# Patient Record
Sex: Female | Born: 1950
Health system: Southern US, Community
[De-identification: ages and names within clinical notes are randomized; demographics above are authoritative.]

## PROBLEM LIST (undated history)

## (undated) ENCOUNTER — Ambulatory Visit: Admission: EM

## (undated) DIAGNOSIS — K2981 Duodenitis with bleeding: Secondary | ICD-10-CM

## (undated) DIAGNOSIS — J449 Chronic obstructive pulmonary disease, unspecified: Secondary | ICD-10-CM

## (undated) DIAGNOSIS — I4891 Unspecified atrial fibrillation: Secondary | ICD-10-CM

## (undated) DIAGNOSIS — K7460 Unspecified cirrhosis of liver: Secondary | ICD-10-CM

## (undated) DIAGNOSIS — K922 Gastrointestinal hemorrhage, unspecified: Secondary | ICD-10-CM

## (undated) DIAGNOSIS — R011 Cardiac murmur, unspecified: Secondary | ICD-10-CM

## (undated) DIAGNOSIS — I10 Essential (primary) hypertension: Secondary | ICD-10-CM

## (undated) DIAGNOSIS — M1990 Unspecified osteoarthritis, unspecified site: Secondary | ICD-10-CM

## (undated) DIAGNOSIS — E039 Hypothyroidism, unspecified: Secondary | ICD-10-CM

## (undated) DIAGNOSIS — N189 Chronic kidney disease, unspecified: Secondary | ICD-10-CM

## (undated) DIAGNOSIS — L919 Hypertrophic disorder of the skin, unspecified: Secondary | ICD-10-CM

## (undated) DIAGNOSIS — L909 Atrophic disorder of skin, unspecified: Secondary | ICD-10-CM

## (undated) DIAGNOSIS — E079 Disorder of thyroid, unspecified: Secondary | ICD-10-CM

## (undated) HISTORY — PX: BREAST EXCISIONAL BIOPSY: SUR124

## (undated) HISTORY — PX: TUBAL LIGATION: SHX77

## (undated) HISTORY — DX: Hypertrophic disorder of the skin, unspecified: L91.9

## (undated) HISTORY — DX: Chronic obstructive pulmonary disease, unspecified: J44.9

## (undated) HISTORY — DX: Chronic kidney disease, unspecified: N18.9

## (undated) HISTORY — DX: Atrophic disorder of skin, unspecified: L90.9

## (undated) HISTORY — PX: UPPER GASTROINTESTINAL ENDOSCOPY: SHX188

## (undated) HISTORY — PX: COLONOSCOPY: SHX174

## (undated) HISTORY — DX: Unspecified osteoarthritis, unspecified site: M19.90

---

## 1997-08-04 ENCOUNTER — Ambulatory Visit (HOSPITAL_COMMUNITY): Admission: RE | Admit: 1997-08-04 | Discharge: 1997-08-04 | Payer: Self-pay | Admitting: Family Medicine

## 1999-01-29 ENCOUNTER — Emergency Department (HOSPITAL_COMMUNITY): Admission: EM | Admit: 1999-01-29 | Discharge: 1999-01-29 | Payer: Self-pay | Admitting: Emergency Medicine

## 1999-02-03 ENCOUNTER — Other Ambulatory Visit: Admission: RE | Admit: 1999-02-03 | Discharge: 1999-02-03 | Payer: Self-pay | Admitting: *Deleted

## 1999-02-17 ENCOUNTER — Other Ambulatory Visit: Admission: RE | Admit: 1999-02-17 | Discharge: 1999-02-17 | Payer: Self-pay | Admitting: *Deleted

## 1999-02-17 ENCOUNTER — Encounter (INDEPENDENT_AMBULATORY_CARE_PROVIDER_SITE_OTHER): Payer: Self-pay | Admitting: Specialist

## 1999-03-19 ENCOUNTER — Encounter: Payer: Self-pay | Admitting: Emergency Medicine

## 1999-03-19 ENCOUNTER — Inpatient Hospital Stay (HOSPITAL_COMMUNITY): Admission: EM | Admit: 1999-03-19 | Discharge: 1999-03-28 | Payer: Self-pay | Admitting: Emergency Medicine

## 1999-03-20 ENCOUNTER — Encounter: Payer: Self-pay | Admitting: Internal Medicine

## 1999-03-21 ENCOUNTER — Encounter: Payer: Self-pay | Admitting: Critical Care Medicine

## 1999-03-25 ENCOUNTER — Encounter: Payer: Self-pay | Admitting: Internal Medicine

## 1999-03-27 ENCOUNTER — Encounter: Payer: Self-pay | Admitting: Gastroenterology

## 1999-04-03 ENCOUNTER — Other Ambulatory Visit (HOSPITAL_COMMUNITY): Admission: RE | Admit: 1999-04-03 | Discharge: 1999-04-13 | Payer: Self-pay | Admitting: Psychiatry

## 1999-11-03 ENCOUNTER — Emergency Department (HOSPITAL_COMMUNITY): Admission: EM | Admit: 1999-11-03 | Discharge: 1999-11-03 | Payer: Self-pay | Admitting: Emergency Medicine

## 1999-11-03 ENCOUNTER — Encounter: Payer: Self-pay | Admitting: Emergency Medicine

## 2000-01-08 ENCOUNTER — Inpatient Hospital Stay (HOSPITAL_COMMUNITY): Admission: EM | Admit: 2000-01-08 | Discharge: 2000-01-11 | Payer: Self-pay

## 2000-05-17 ENCOUNTER — Ambulatory Visit (HOSPITAL_COMMUNITY): Admission: RE | Admit: 2000-05-17 | Discharge: 2000-05-17 | Payer: Self-pay | Admitting: *Deleted

## 2002-07-12 ENCOUNTER — Inpatient Hospital Stay (HOSPITAL_COMMUNITY): Admission: EM | Admit: 2002-07-12 | Discharge: 2002-07-21 | Payer: Self-pay | Admitting: Emergency Medicine

## 2002-07-13 ENCOUNTER — Encounter: Payer: Self-pay | Admitting: Internal Medicine

## 2003-06-14 ENCOUNTER — Encounter: Admission: RE | Admit: 2003-06-14 | Discharge: 2003-06-14 | Payer: Self-pay | Admitting: Family Medicine

## 2003-09-28 ENCOUNTER — Encounter (HOSPITAL_COMMUNITY): Admission: RE | Admit: 2003-09-28 | Discharge: 2003-12-27 | Payer: Self-pay | Admitting: Nurse Practitioner

## 2003-11-25 ENCOUNTER — Encounter: Admission: RE | Admit: 2003-11-25 | Discharge: 2003-11-25 | Payer: Self-pay | Admitting: Internal Medicine

## 2004-03-03 ENCOUNTER — Ambulatory Visit: Payer: Self-pay | Admitting: *Deleted

## 2004-03-06 ENCOUNTER — Ambulatory Visit: Payer: Self-pay | Admitting: Nurse Practitioner

## 2004-03-13 ENCOUNTER — Ambulatory Visit: Payer: Self-pay | Admitting: Nurse Practitioner

## 2004-03-20 ENCOUNTER — Encounter (HOSPITAL_COMMUNITY): Admission: RE | Admit: 2004-03-20 | Discharge: 2004-06-18 | Payer: Self-pay | Admitting: Internal Medicine

## 2004-03-21 ENCOUNTER — Ambulatory Visit: Payer: Self-pay | Admitting: Nurse Practitioner

## 2004-03-28 ENCOUNTER — Ambulatory Visit: Payer: Self-pay | Admitting: *Deleted

## 2004-04-19 ENCOUNTER — Ambulatory Visit: Payer: Self-pay | Admitting: Nurse Practitioner

## 2004-05-02 ENCOUNTER — Ambulatory Visit: Payer: Self-pay | Admitting: Nurse Practitioner

## 2004-05-17 ENCOUNTER — Ambulatory Visit: Payer: Self-pay | Admitting: Nurse Practitioner

## 2004-05-22 ENCOUNTER — Ambulatory Visit: Payer: Self-pay | Admitting: Nurse Practitioner

## 2004-06-01 ENCOUNTER — Ambulatory Visit: Payer: Self-pay | Admitting: Nurse Practitioner

## 2004-06-08 ENCOUNTER — Ambulatory Visit: Payer: Self-pay | Admitting: Nurse Practitioner

## 2004-07-10 ENCOUNTER — Ambulatory Visit: Payer: Self-pay | Admitting: Nurse Practitioner

## 2004-07-20 ENCOUNTER — Ambulatory Visit: Payer: Self-pay | Admitting: Nurse Practitioner

## 2004-07-27 ENCOUNTER — Ambulatory Visit: Payer: Self-pay | Admitting: Nurse Practitioner

## 2004-08-24 ENCOUNTER — Ambulatory Visit: Payer: Self-pay | Admitting: Nurse Practitioner

## 2004-09-07 ENCOUNTER — Ambulatory Visit: Payer: Self-pay | Admitting: Nurse Practitioner

## 2004-10-09 ENCOUNTER — Ambulatory Visit: Payer: Self-pay | Admitting: Nurse Practitioner

## 2005-01-04 ENCOUNTER — Encounter: Admission: RE | Admit: 2005-01-04 | Discharge: 2005-01-04 | Payer: Self-pay | Admitting: Geriatric Medicine

## 2005-01-22 ENCOUNTER — Encounter (HOSPITAL_COMMUNITY): Admission: RE | Admit: 2005-01-22 | Discharge: 2005-04-22 | Payer: Self-pay | Admitting: Endocrinology

## 2005-02-02 IMAGING — NM NM RAI THERAPY FOR HYPERTHYROIDISM
1 series · 1 of 1 positions shown · non-contrast
Comparison: [DATE].
 Radiopharmaceutical:  26 millicuries of [5B] per orally.

CLINICAL DATA: Graves? disease
 RADIOACTIVE IODINE THERAPY FOR TREATMENT OF GRAVES? DISEASE ? [DATE]:

[Series 1: st static image · 1 of 1 slices shown]
[im 1/1]
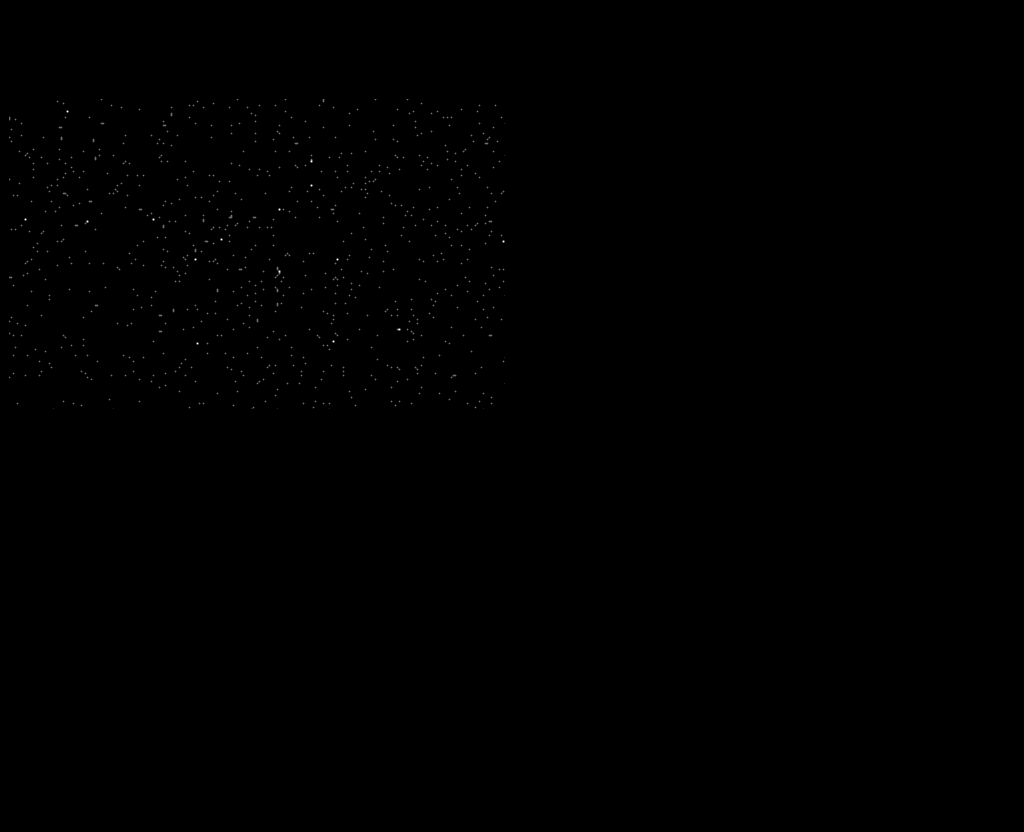

[1 of 1 positions shown; findings below may reference images not displayed]

FINDINGS: Risks and benefits of the treatment were discussed with the patient in detail.  As prescribed by Dr. LIBBIE, the patient received 26 millicuries of [5B] per orally.  No immediate complications were identified.
IMPRESSION: Successful per oral administration of 26 millicuries of [5B] sodium iodide for the treatment of Graves? disease.

## 2005-03-25 IMAGING — CR DG CHEST 1V PORT
1 series · 1 of 1 positions shown · non-contrast
Comparison: none

CLINICAL DATA: Upper GI bleeding.  Hypotension.
 PORTABLE CHEST - 1 VIEW:

[view not recorded]
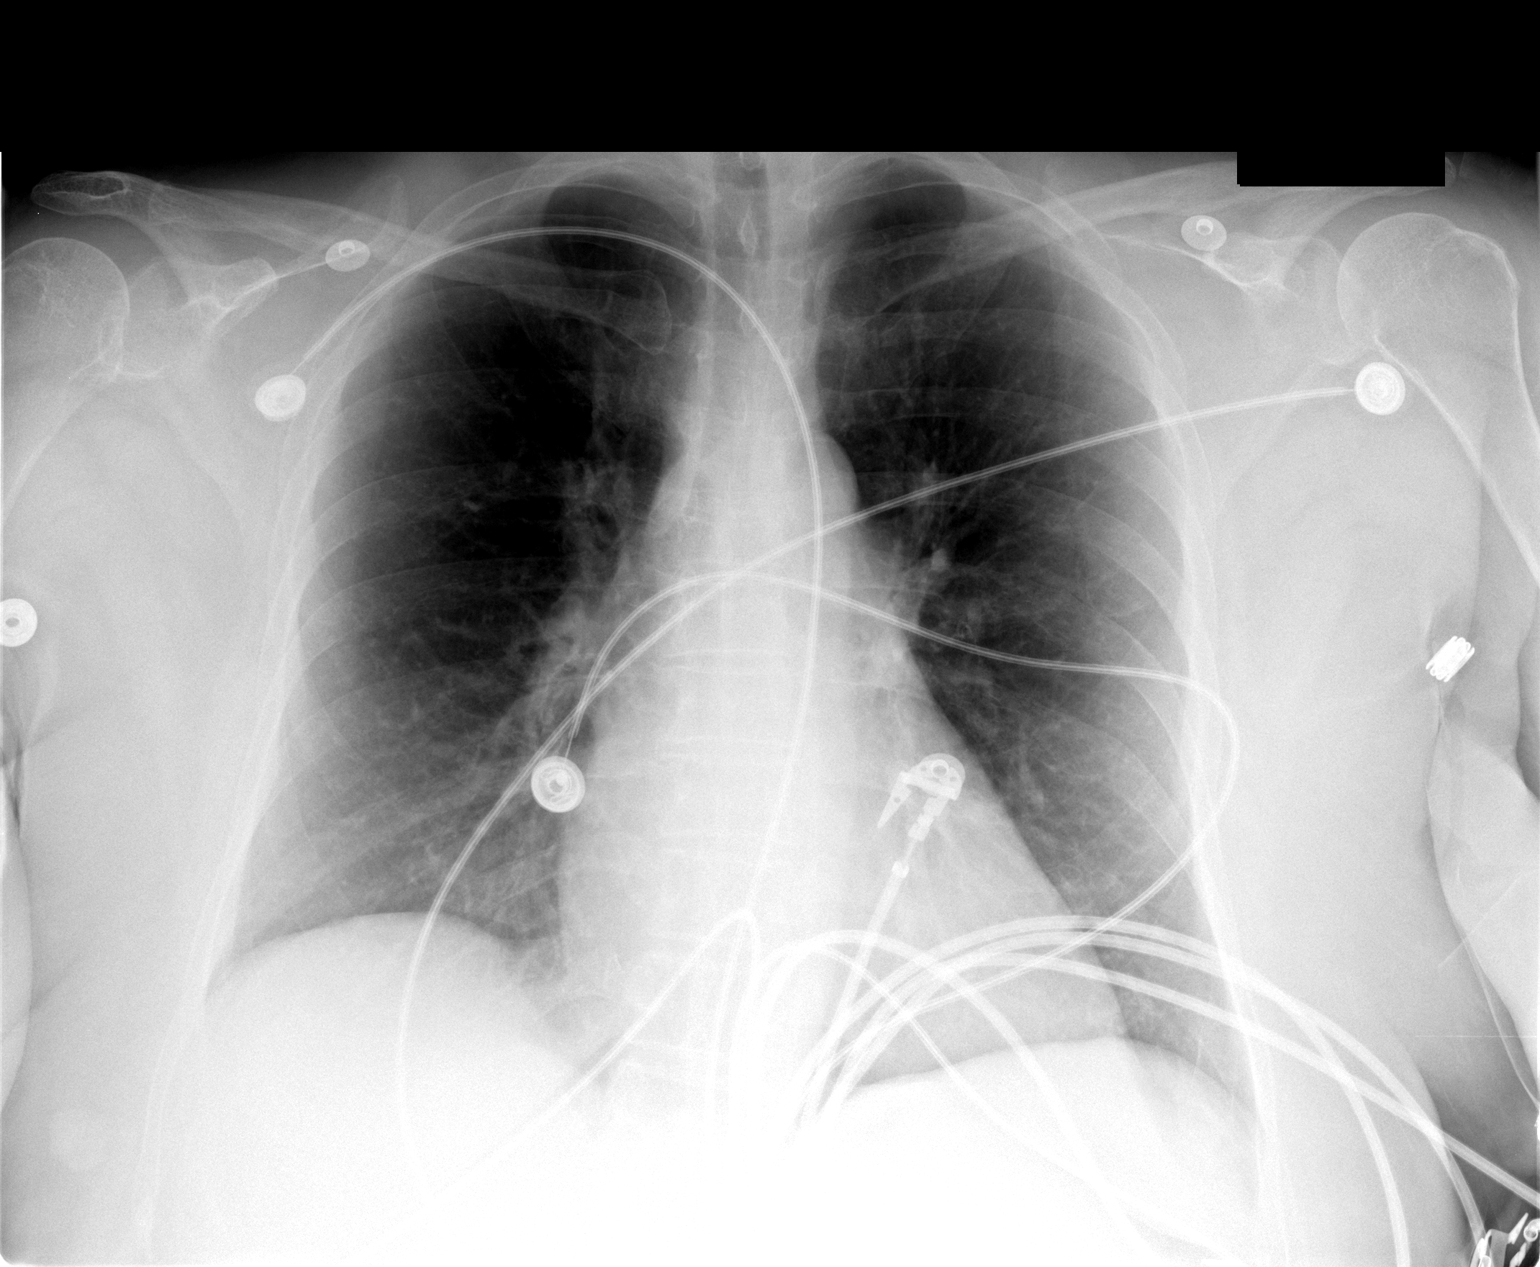

[1 of 1 positions shown; findings below may reference images not displayed]

FINDINGS: The heart size and mediastinal contours are within normal limits.  Both lungs are clear.
IMPRESSION: No acute disease.

## 2005-09-10 ENCOUNTER — Encounter: Admission: RE | Admit: 2005-09-10 | Discharge: 2005-09-10 | Payer: Self-pay | Admitting: Geriatric Medicine

## 2005-09-10 IMAGING — US UNKNOWN US STUDY
1 series · 12 of 12 positions shown · non-contrast
Comparison: none

BILATERAL BREAST ULTRASOUND:
CLINICAL DATA: The patient returns for short interval follow-up of a suspected benign fibroadenoma
in each breast noted on [DATE].

[Series 1: unknown us study · 12 of 12 slices shown]
[im 1/12]
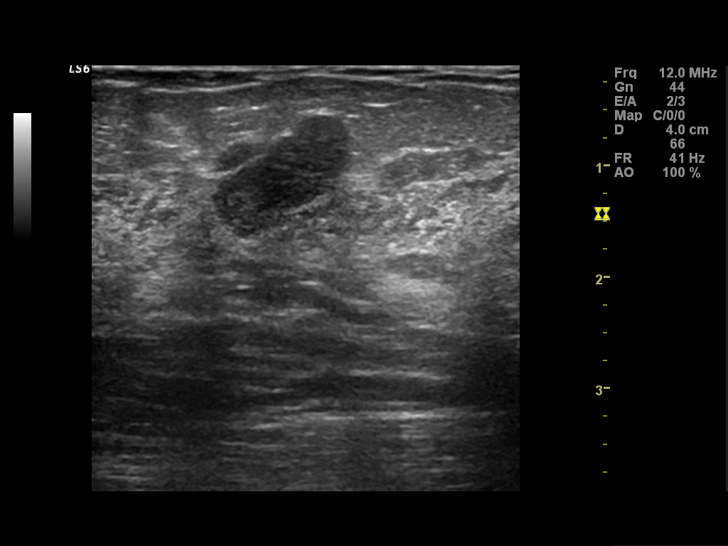
[im 2/12]
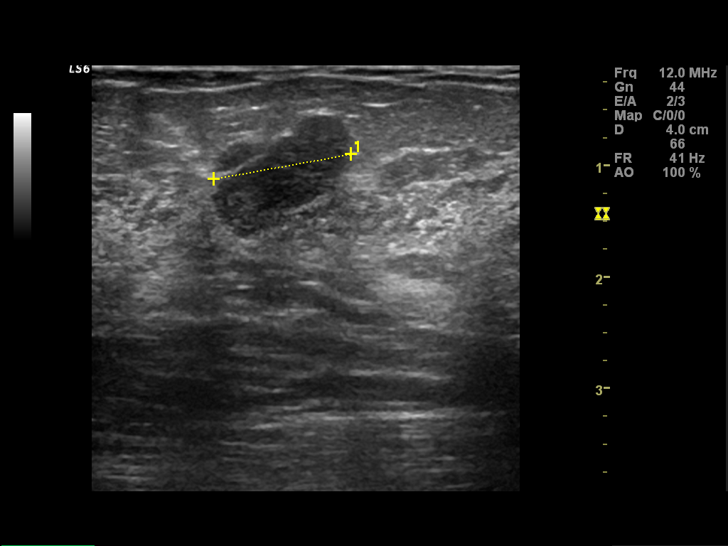
[im 3/12]
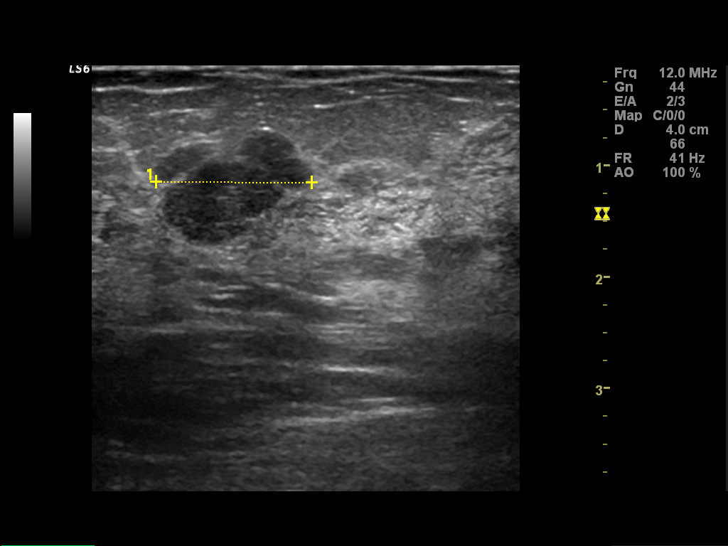
[im 4/12]
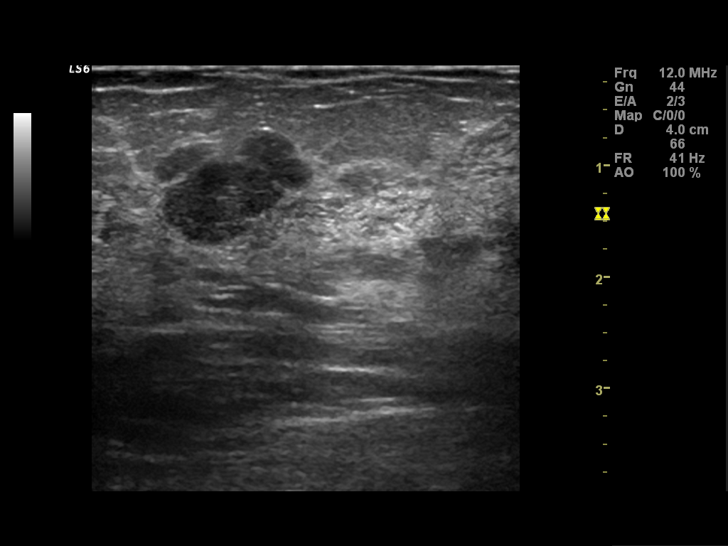
[im 5/12]
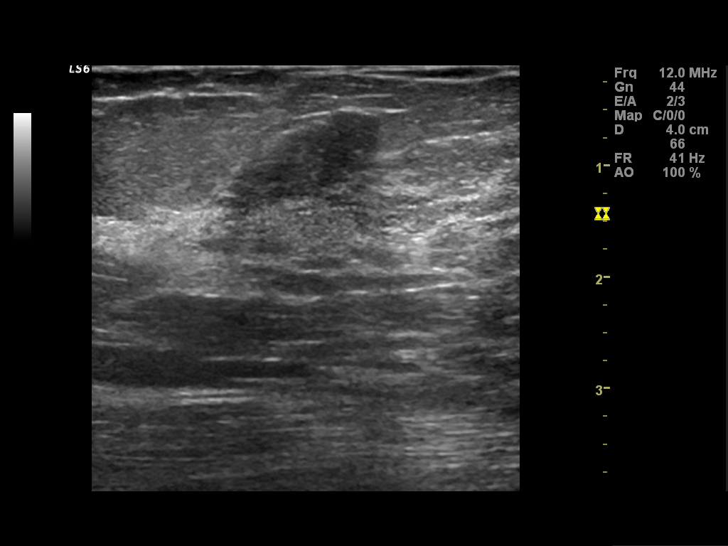
[im 6/12]
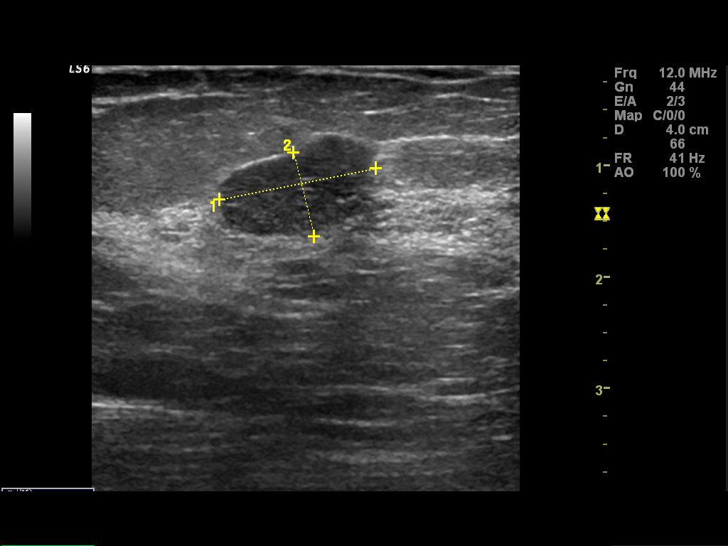
[im 7/12]
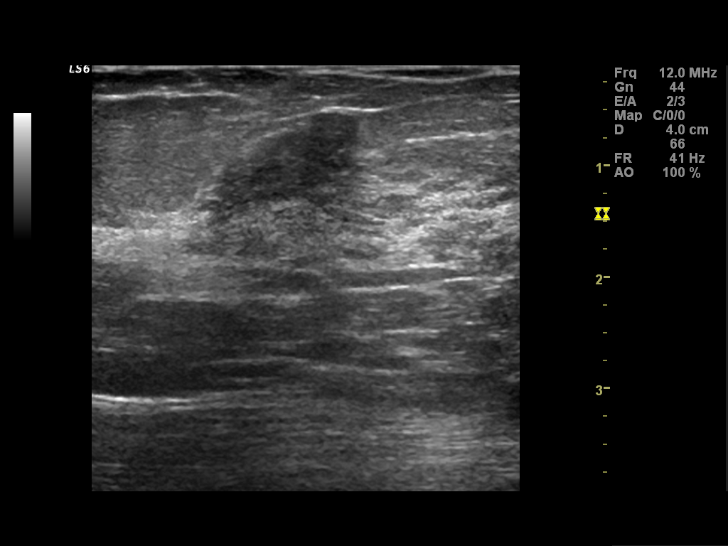
[im 8/12]
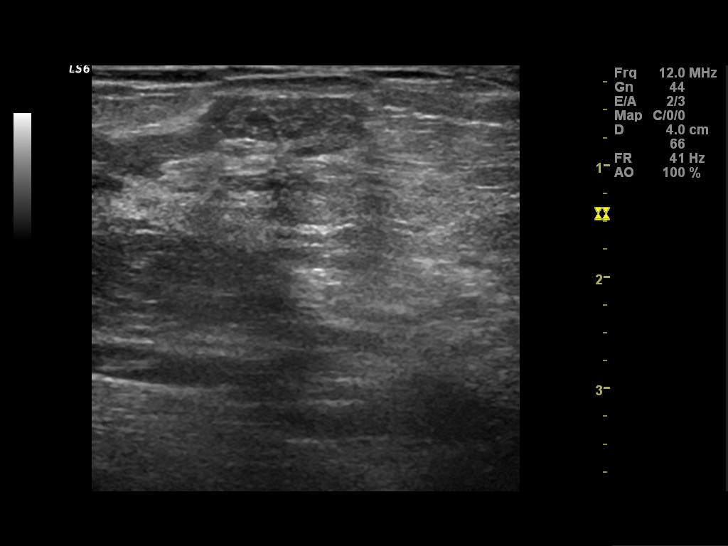
[im 9/12]
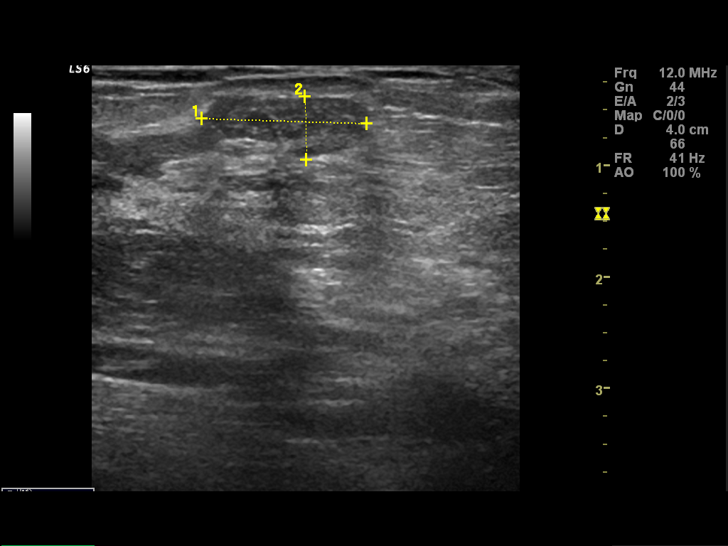
[im 10/12]
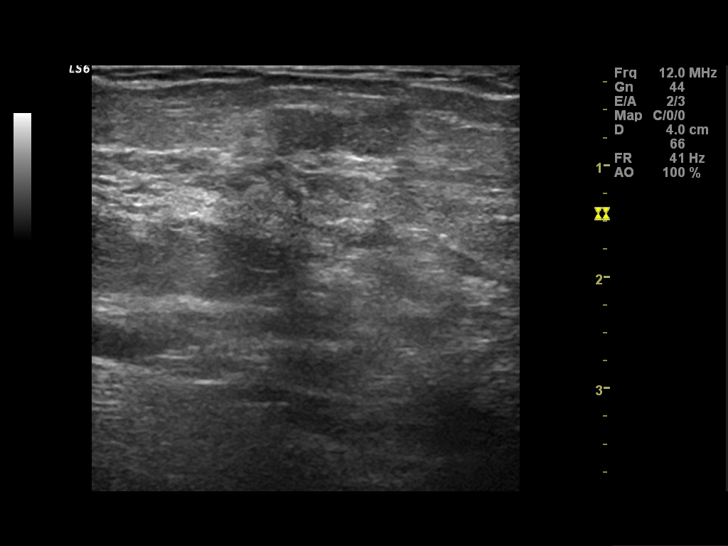
[im 11/12]
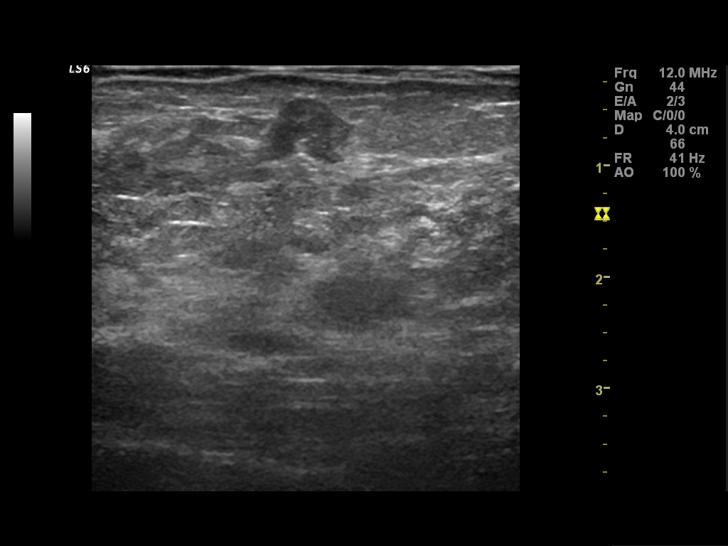
[im 12/12]
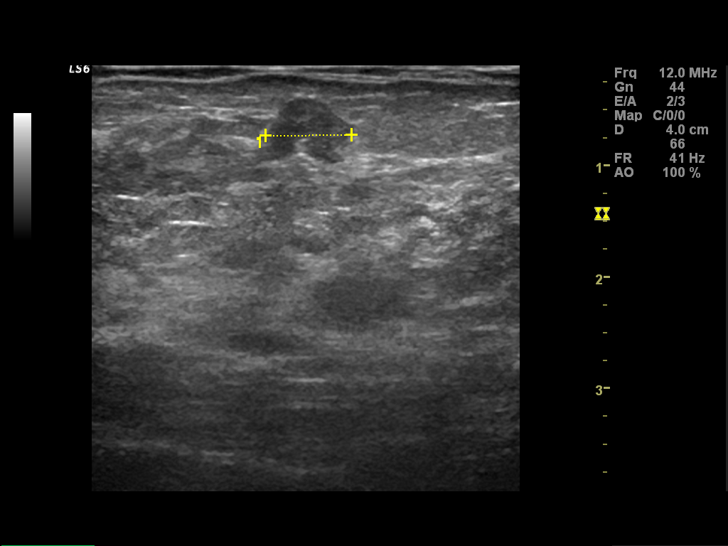

[12 of 12 positions shown; findings below may reference images not displayed]

On physical examination, the breast tissue is quite firm superiorly and bilaterally.  Sonography of
the right breast demonstrates a lobulated solid mass with a well-defined border measuring 1.4 x 
1.4 x 0.8 cm. This is unchanged in configuration as compared to the prior exam.

Sonography of the left breast demonstrates an oval hypoechoic nodule at 9 o'clock in zone 2 
measuring 1.5 x 0.6 x 0.8 cm.  This too is stable.
IMPRESSION: Stable probably benign fibroadenoma in each breast as described above.  Suggest bilateral 
diagnostic mammography and bilateral ultrasound in [DATE] which would be a one-year follow-up.

ASSESSMENT: Probably benign - BI-RADS 3

Diagnostic mammogram and ultrasound of both breasts in 4 months.

## 2006-06-18 ENCOUNTER — Inpatient Hospital Stay (HOSPITAL_COMMUNITY): Admission: EM | Admit: 2006-06-18 | Discharge: 2006-06-22 | Payer: Self-pay | Admitting: Emergency Medicine

## 2006-06-18 ENCOUNTER — Ambulatory Visit: Payer: Self-pay | Admitting: Family Medicine

## 2006-06-19 ENCOUNTER — Encounter: Payer: Self-pay | Admitting: Gastroenterology

## 2006-06-19 ENCOUNTER — Encounter (INDEPENDENT_AMBULATORY_CARE_PROVIDER_SITE_OTHER): Payer: Self-pay | Admitting: Specialist

## 2006-06-22 ENCOUNTER — Encounter: Payer: Self-pay | Admitting: Gastroenterology

## 2006-06-24 ENCOUNTER — Ambulatory Visit: Payer: Self-pay | Admitting: Gastroenterology

## 2006-08-05 ENCOUNTER — Ambulatory Visit: Payer: Self-pay | Admitting: Gastroenterology

## 2006-08-15 ENCOUNTER — Ambulatory Visit: Payer: Self-pay | Admitting: Gastroenterology

## 2006-08-15 ENCOUNTER — Encounter: Payer: Self-pay | Admitting: Internal Medicine

## 2006-11-19 ENCOUNTER — Encounter (INDEPENDENT_AMBULATORY_CARE_PROVIDER_SITE_OTHER): Payer: Self-pay | Admitting: Family Medicine

## 2006-11-19 ENCOUNTER — Ambulatory Visit: Payer: Self-pay | Admitting: Sports Medicine

## 2006-11-19 DIAGNOSIS — K7030 Alcoholic cirrhosis of liver without ascites: Secondary | ICD-10-CM | POA: Insufficient documentation

## 2006-11-19 LAB — CONVERTED CEMR LAB
HCT: 35.6 %
Hemoglobin: 12.1 g/dL
MCV: 90.1 fL
Platelets: 151 10*3/uL
RBC: 3.95 M/uL

## 2006-11-26 LAB — CONVERTED CEMR LAB
ALT: 13 units/L (ref 0–35)
Alkaline Phosphatase: 123 units/L — ABNORMAL HIGH (ref 39–117)
CO2: 28 meq/L (ref 19–32)
Creatinine, Ser: 0.8 mg/dL (ref 0.40–1.20)
TSH: 0.059 microintl units/mL — ABNORMAL LOW (ref 0.350–5.50)
Total Bilirubin: 1 mg/dL (ref 0.3–1.2)

## 2006-12-26 ENCOUNTER — Telehealth (INDEPENDENT_AMBULATORY_CARE_PROVIDER_SITE_OTHER): Payer: Self-pay | Admitting: Family Medicine

## 2006-12-31 ENCOUNTER — Encounter: Admission: RE | Admit: 2006-12-31 | Discharge: 2006-12-31 | Payer: Self-pay | Admitting: Family Medicine

## 2006-12-31 IMAGING — MG MM DIAGNOSTIC BILATERAL
4 series · 4 of 4 positions shown · non-contrast
Comparison: [DATE] and [DATE] ultrasound, [DATE] mammogram.

DG DIAGNOSTIC BILATERAL
Bilateral CC and MLO view(s) were taken.

BILATERAL BREAST ULTRASOUND
Technologist: [place], Medical
DIGITAL BILATERAL  DIAGNOSTIC MAMMOGRAM WITH CAD AND BILATERAL BREAST ULTRASOUND:
CLINICAL DATA: Short term follow-up bilateral breast masses.  The patient was due to return in 
[DATE] but did not return and presents for her annual exam today.

[R CC]
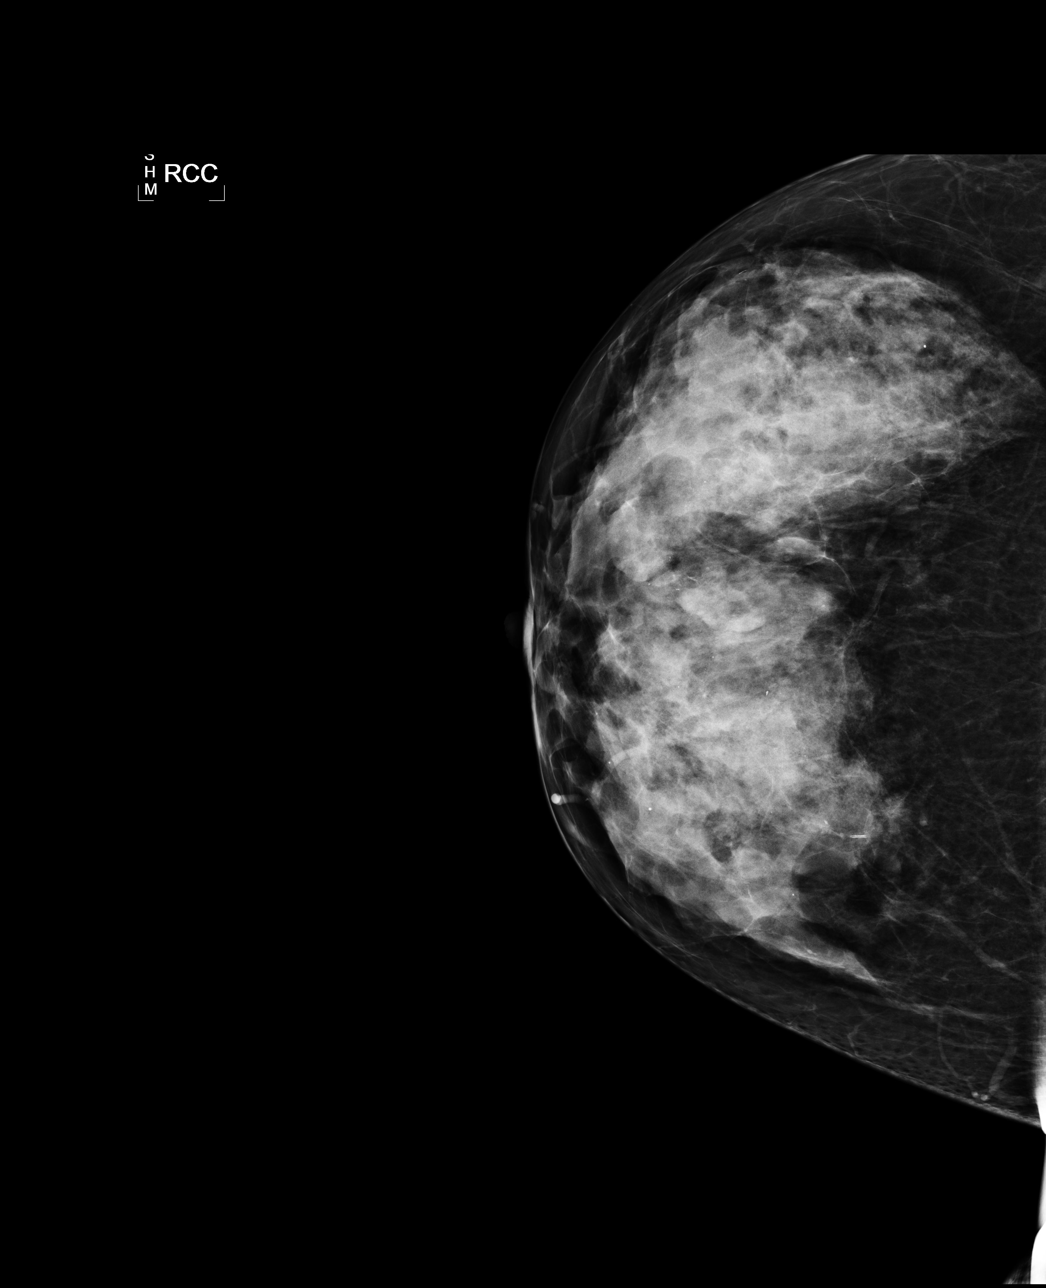

[L CC]
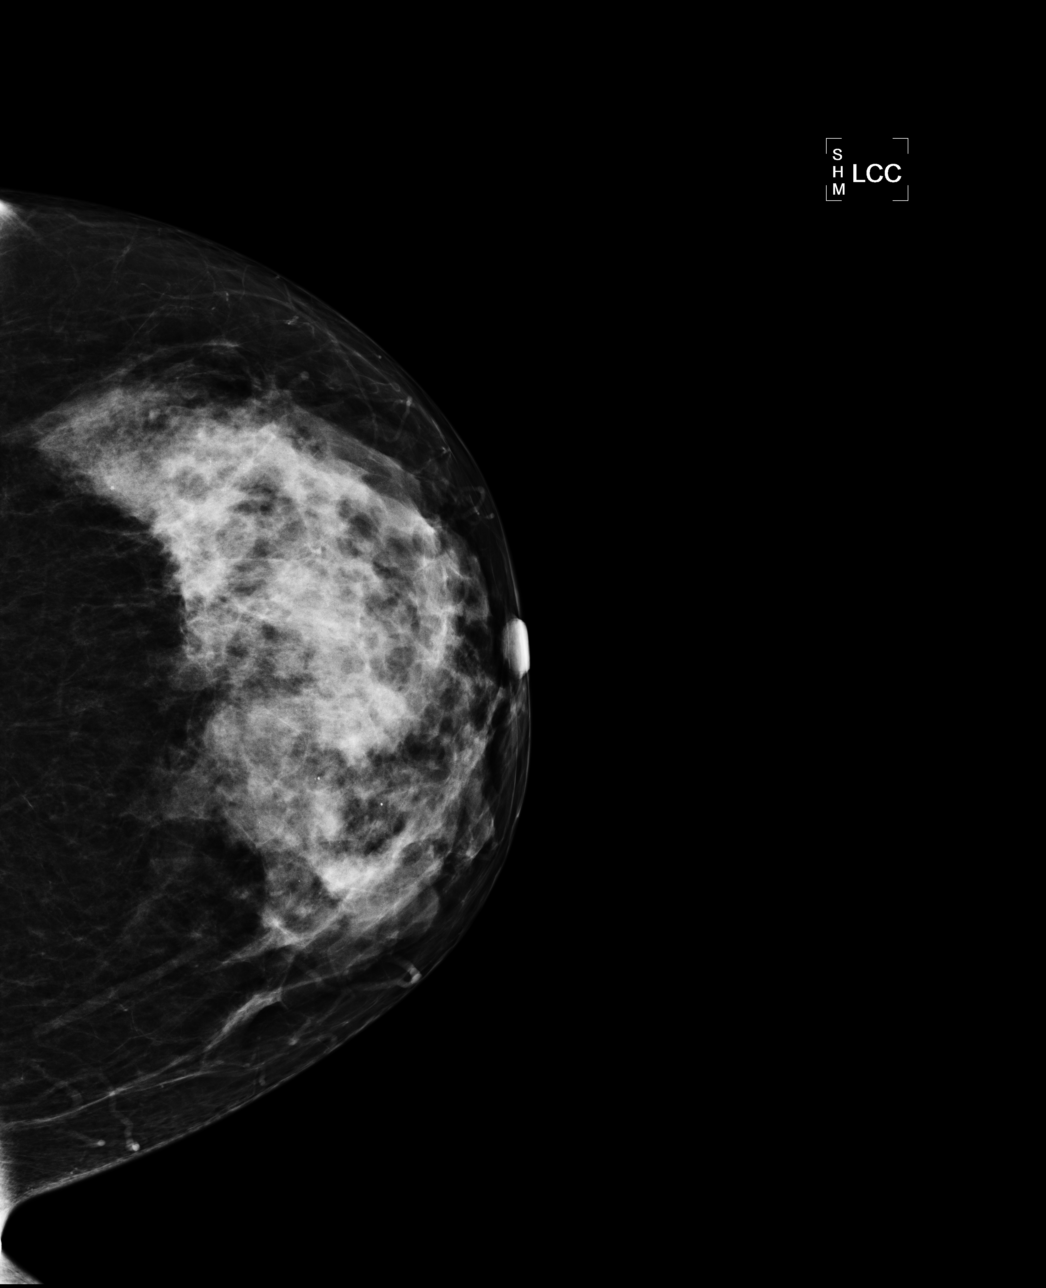

[L MLO]
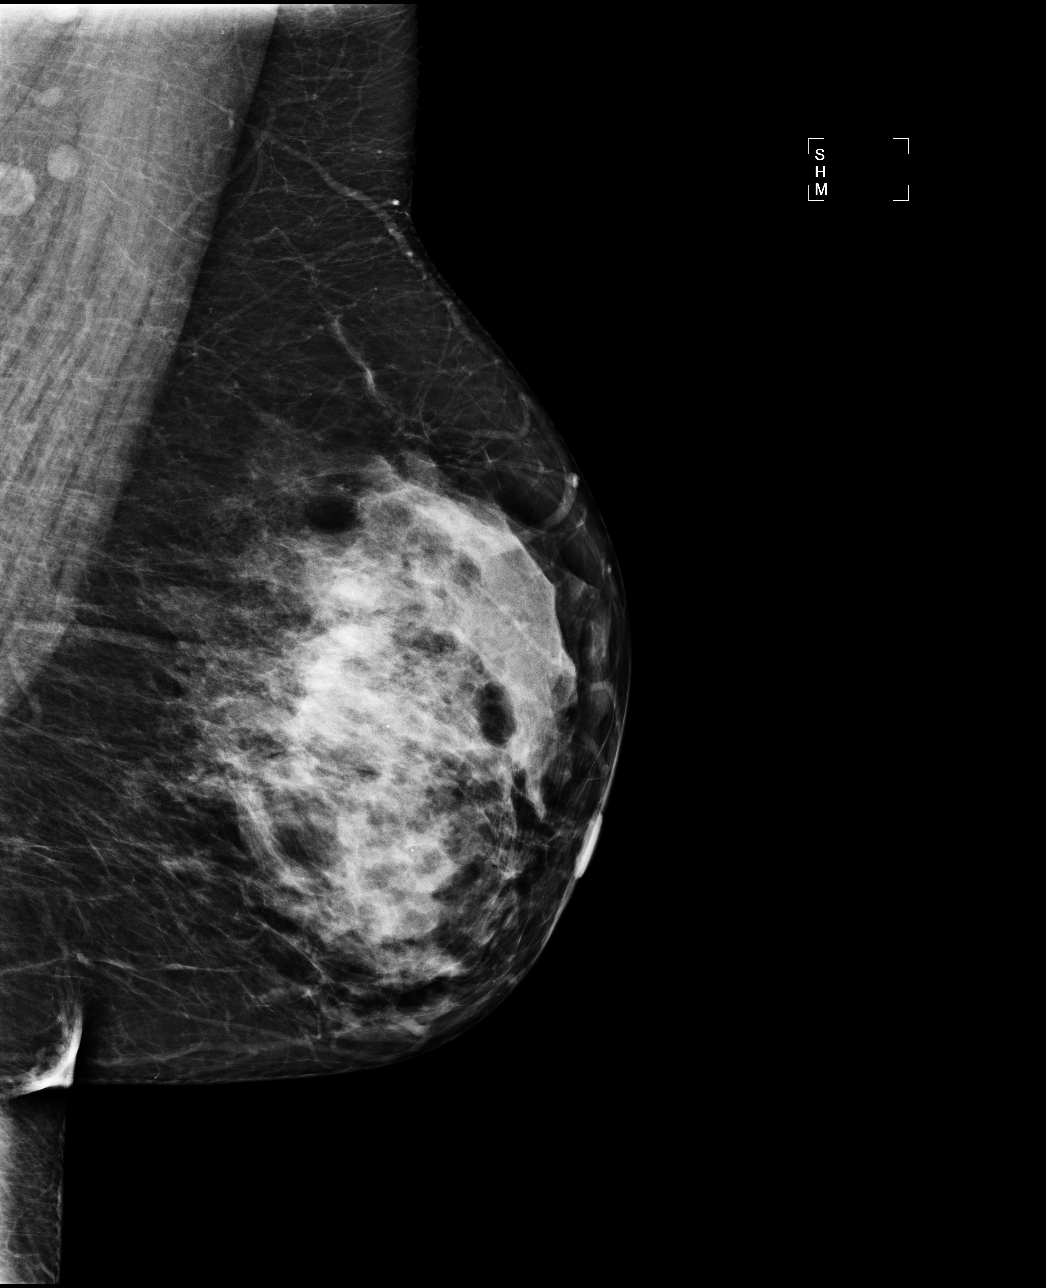

[R MLO]
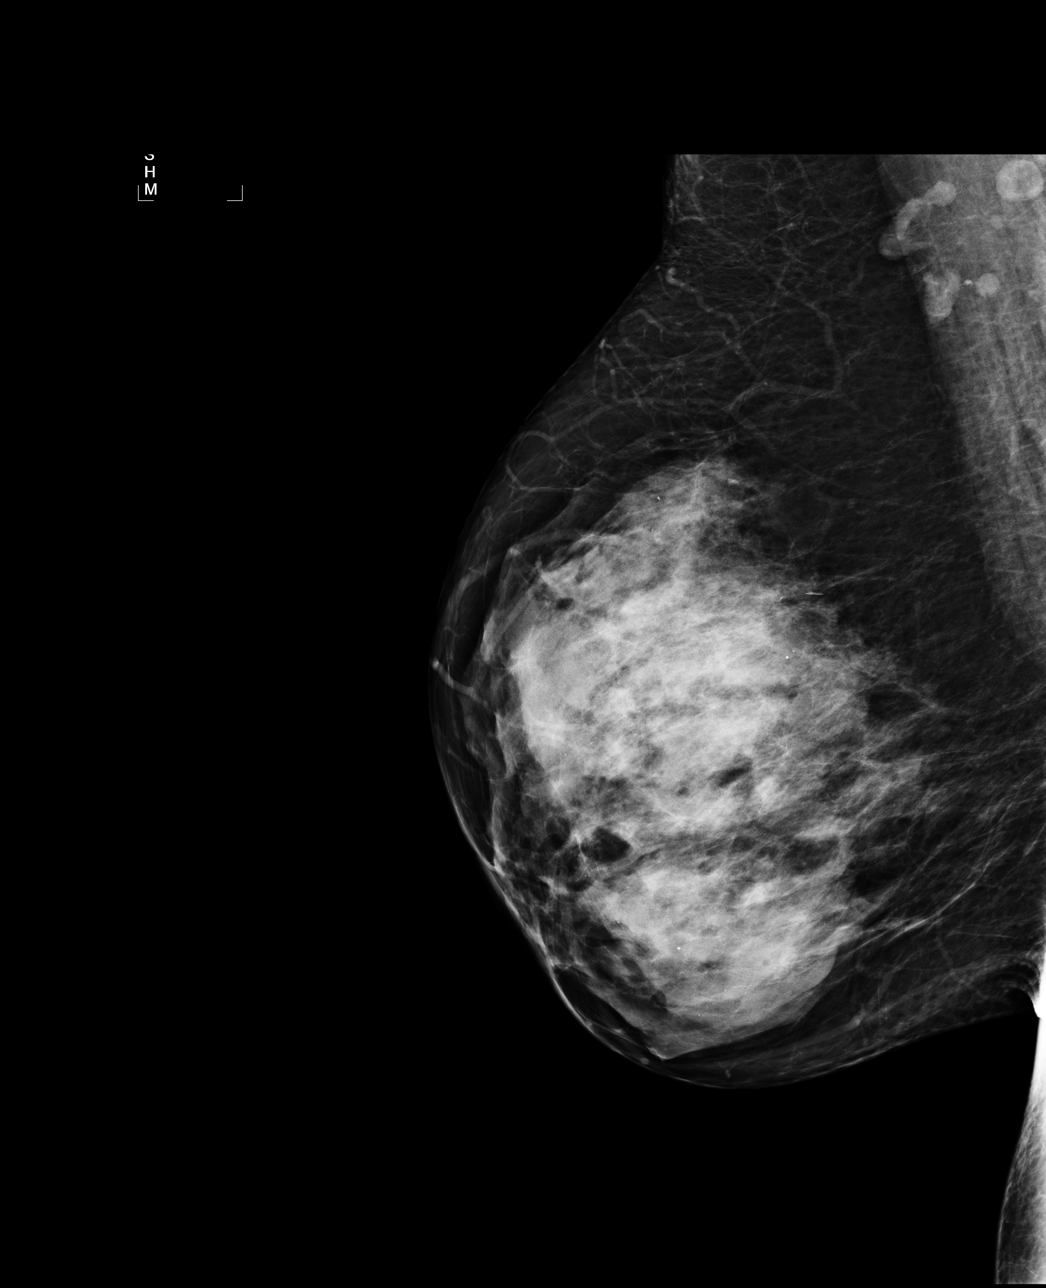

[4 of 4 positions shown; findings below may reference images not displayed]

The bilateral breast parenchyma is extremely dense, which may limit the sensitivity of mammography.
No suspicious mass, calcification, or architectural distortion is seen.  There is no change.

Ultrasound of the left breast 9 o'clock location 4 cm from the nipple demonstrates a 1.5 x 0.8 x 
0.5 cm oval hypoechoic mass, unchanged.

Ultrasound of the right breast  12 o'clock location 5 cm from the nipple demonstrates a 1.3 x 1.3 x
0.7 cm oval circumscribed hypoechoic mass.  This is unchanged.
IMPRESSION: Stable benign-appearing bilateral breast masses; given their two-year period of interval stability,
no specific follow-up is needed for these areas.  Screening mammography is recommended in one 
year.  Findings and recommendations discussed with the patient and provided in written form at the 
time of the exam.

ASSESSMENT: Benign - BI-RADS 2

Routine screening mammogram in 1 year.
ANALYZED BY COMPUTER AIDED DETECTION. ,

## 2006-12-31 IMAGING — US UNKNOWN PR STUDY
1 series · 12 of 12 positions shown · non-contrast
Comparison: [DATE] and [DATE] ultrasound, [DATE] mammogram.

DG DIAGNOSTIC BILATERAL
Bilateral CC and MLO view(s) were taken.

BILATERAL BREAST ULTRASOUND
Technologist: [place], Medical
DIGITAL BILATERAL  DIAGNOSTIC MAMMOGRAM WITH CAD AND BILATERAL BREAST ULTRASOUND:
CLINICAL DATA: Short term follow-up bilateral breast masses.  The patient was due to return in 
[DATE] but did not return and presents for her annual exam today.

[Series 1: unknown pr study · 12 of 12 slices shown]
[im 1/12]
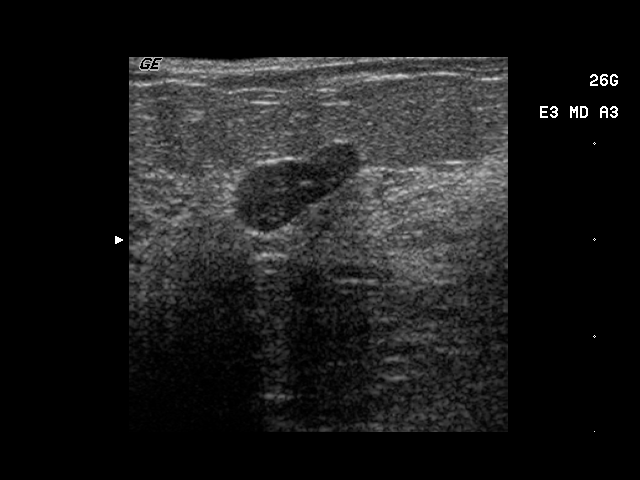
[im 2/12]
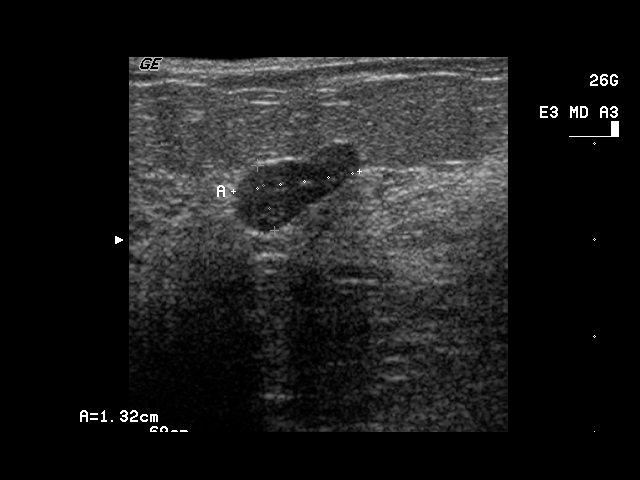
[im 3/12]
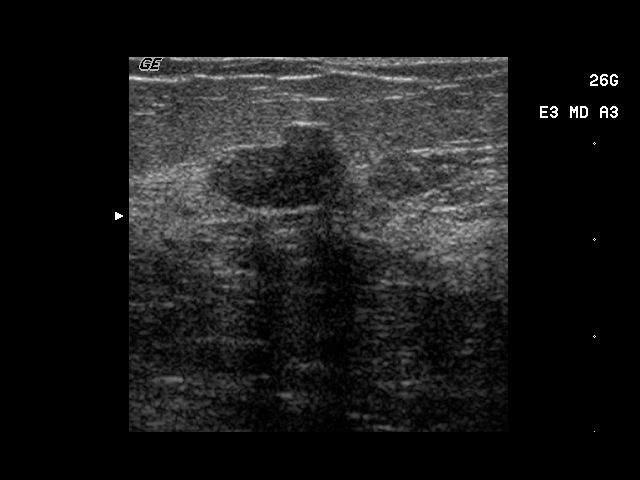
[im 4/12]
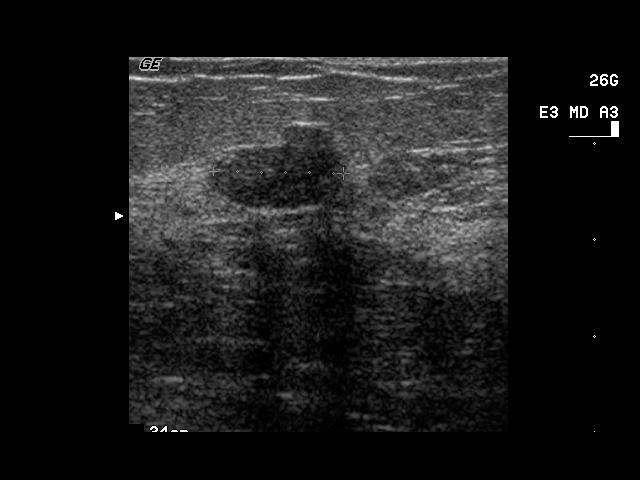
[im 5/12]
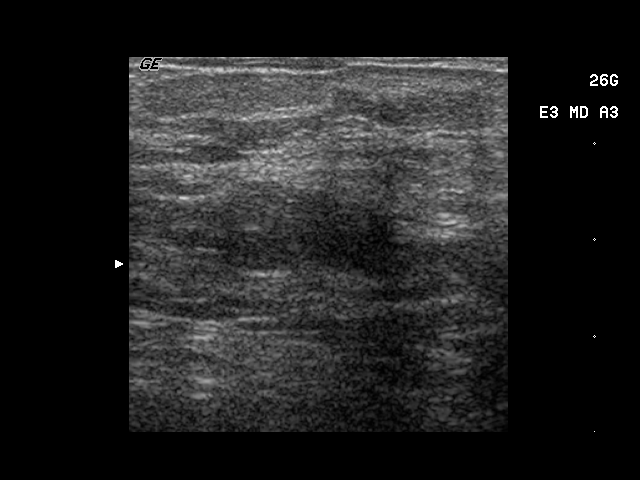
[im 6/12]
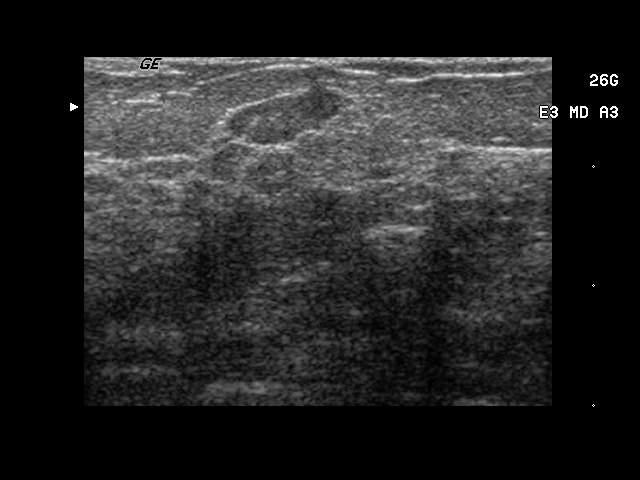
[im 7/12]
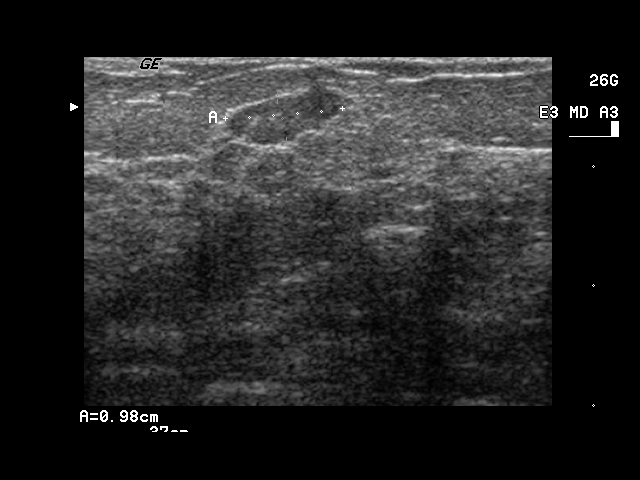
[im 8/12]
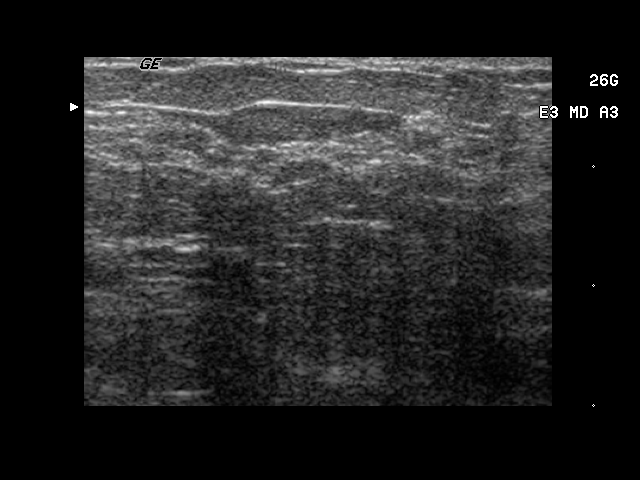
[im 9/12]
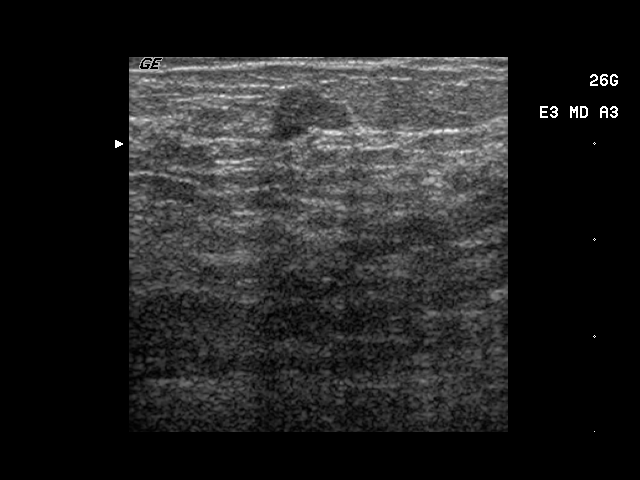
[im 10/12]
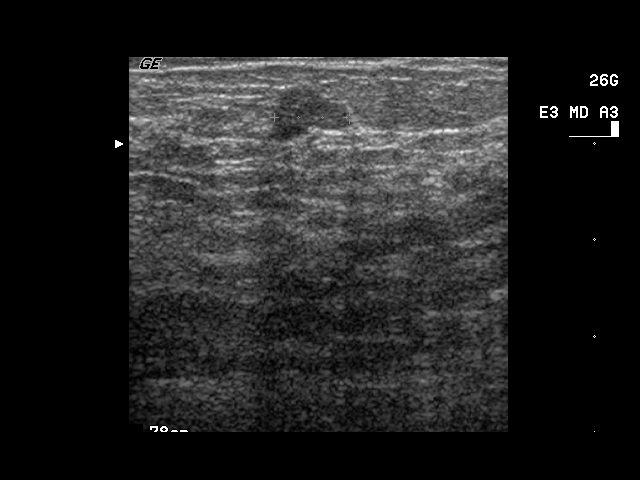
[im 11/12]
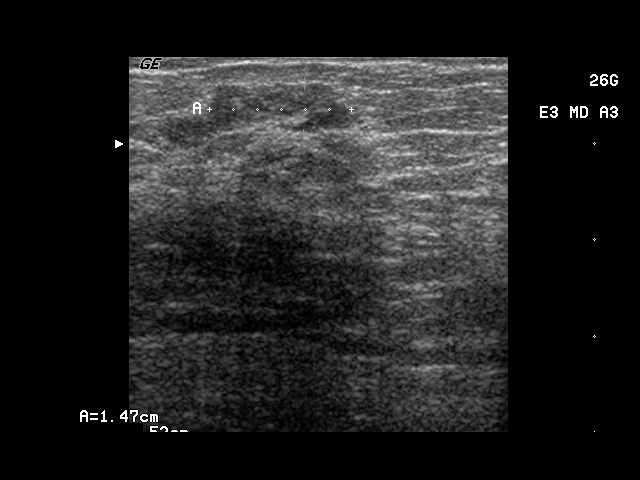
[im 12/12]
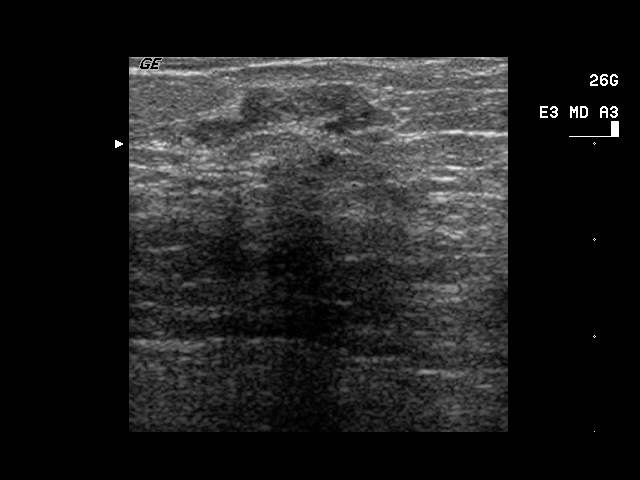

[12 of 12 positions shown; findings below may reference images not displayed]

The bilateral breast parenchyma is extremely dense, which may limit the sensitivity of mammography.
No suspicious mass, calcification, or architectural distortion is seen.  There is no change.

Ultrasound of the left breast 9 o'clock location 4 cm from the nipple demonstrates a 1.5 x 0.8 x 
0.5 cm oval hypoechoic mass, unchanged.

Ultrasound of the right breast  12 o'clock location 5 cm from the nipple demonstrates a 1.3 x 1.3 x
0.7 cm oval circumscribed hypoechoic mass.  This is unchanged.
IMPRESSION: Stable benign-appearing bilateral breast masses; given their two-year period of interval stability,
no specific follow-up is needed for these areas.  Screening mammography is recommended in one 
year.  Findings and recommendations discussed with the patient and provided in written form at the 
time of the exam.

ASSESSMENT: Benign - BI-RADS 2

Routine screening mammogram in 1 year.
ANALYZED BY COMPUTER AIDED DETECTION. ,

## 2007-01-01 ENCOUNTER — Encounter (INDEPENDENT_AMBULATORY_CARE_PROVIDER_SITE_OTHER): Payer: Self-pay | Admitting: Family Medicine

## 2007-01-03 ENCOUNTER — Encounter (INDEPENDENT_AMBULATORY_CARE_PROVIDER_SITE_OTHER): Payer: Self-pay | Admitting: Family Medicine

## 2007-01-07 ENCOUNTER — Ambulatory Visit: Payer: Self-pay | Admitting: Family Medicine

## 2007-01-07 ENCOUNTER — Encounter (INDEPENDENT_AMBULATORY_CARE_PROVIDER_SITE_OTHER): Payer: Self-pay | Admitting: Family Medicine

## 2007-01-08 LAB — CONVERTED CEMR LAB
Free T4: 1.55 ng/dL (ref 0.89–1.80)
TSH: 0.133 microintl units/mL — ABNORMAL LOW (ref 0.350–5.50)

## 2007-01-24 ENCOUNTER — Ambulatory Visit: Payer: Self-pay | Admitting: Family Medicine

## 2007-01-24 ENCOUNTER — Encounter (INDEPENDENT_AMBULATORY_CARE_PROVIDER_SITE_OTHER): Payer: Self-pay | Admitting: Family Medicine

## 2007-01-24 LAB — CONVERTED CEMR LAB: TSH: 0.158 microintl units/mL — ABNORMAL LOW (ref 0.350–5.50)

## 2007-01-27 ENCOUNTER — Encounter (INDEPENDENT_AMBULATORY_CARE_PROVIDER_SITE_OTHER): Payer: Self-pay | Admitting: Family Medicine

## 2007-01-29 ENCOUNTER — Telehealth (INDEPENDENT_AMBULATORY_CARE_PROVIDER_SITE_OTHER): Payer: Self-pay | Admitting: *Deleted

## 2007-02-11 ENCOUNTER — Encounter (INDEPENDENT_AMBULATORY_CARE_PROVIDER_SITE_OTHER): Payer: Self-pay | Admitting: Family Medicine

## 2007-03-19 ENCOUNTER — Ambulatory Visit: Payer: Self-pay | Admitting: Family Medicine

## 2007-03-20 ENCOUNTER — Telehealth (INDEPENDENT_AMBULATORY_CARE_PROVIDER_SITE_OTHER): Payer: Self-pay | Admitting: *Deleted

## 2007-03-26 ENCOUNTER — Encounter (INDEPENDENT_AMBULATORY_CARE_PROVIDER_SITE_OTHER): Payer: Self-pay | Admitting: Family Medicine

## 2007-03-26 ENCOUNTER — Ambulatory Visit: Payer: Self-pay | Admitting: Family Medicine

## 2007-03-27 ENCOUNTER — Encounter (INDEPENDENT_AMBULATORY_CARE_PROVIDER_SITE_OTHER): Payer: Self-pay | Admitting: Family Medicine

## 2007-04-15 ENCOUNTER — Encounter (INDEPENDENT_AMBULATORY_CARE_PROVIDER_SITE_OTHER): Payer: Self-pay | Admitting: Family Medicine

## 2007-04-16 ENCOUNTER — Telehealth (INDEPENDENT_AMBULATORY_CARE_PROVIDER_SITE_OTHER): Payer: Self-pay | Admitting: Family Medicine

## 2007-05-07 ENCOUNTER — Ambulatory Visit: Payer: Self-pay | Admitting: Family Medicine

## 2007-06-13 ENCOUNTER — Ambulatory Visit: Payer: Self-pay | Admitting: Family Medicine

## 2007-06-13 ENCOUNTER — Encounter (INDEPENDENT_AMBULATORY_CARE_PROVIDER_SITE_OTHER): Payer: Self-pay | Admitting: Family Medicine

## 2007-06-13 DIAGNOSIS — E018 Other iodine-deficiency related thyroid disorders and allied conditions: Secondary | ICD-10-CM | POA: Insufficient documentation

## 2007-06-13 LAB — CONVERTED CEMR LAB
AST: 14 units/L (ref 0–37)
Albumin: 4.3 g/dL (ref 3.5–5.2)
Alkaline Phosphatase: 101 units/L (ref 39–117)
Chloride: 103 meq/L (ref 96–112)
LDL Cholesterol: 101 mg/dL — ABNORMAL HIGH (ref 0–99)
Potassium: 4.2 meq/L (ref 3.5–5.3)
Sodium: 138 meq/L (ref 135–145)
TSH: 0.565 microintl units/mL (ref 0.350–5.50)
Total Protein: 7.2 g/dL (ref 6.0–8.3)

## 2007-06-17 ENCOUNTER — Encounter (INDEPENDENT_AMBULATORY_CARE_PROVIDER_SITE_OTHER): Payer: Self-pay | Admitting: *Deleted

## 2007-10-01 ENCOUNTER — Encounter (INDEPENDENT_AMBULATORY_CARE_PROVIDER_SITE_OTHER): Payer: Self-pay | Admitting: Family Medicine

## 2007-10-01 ENCOUNTER — Ambulatory Visit: Payer: Self-pay | Admitting: Family Medicine

## 2007-10-01 DIAGNOSIS — F17200 Nicotine dependence, unspecified, uncomplicated: Secondary | ICD-10-CM | POA: Insufficient documentation

## 2007-10-01 LAB — CONVERTED CEMR LAB
ALT: 12 units/L (ref 0–35)
AST: 13 units/L (ref 0–37)
Alkaline Phosphatase: 86 units/L (ref 39–117)
HCT: 32.7 % — ABNORMAL LOW (ref 36.0–46.0)
Hemoglobin: 11.8 g/dL — ABNORMAL LOW (ref 12.0–15.0)
MCHC: 36.1 g/dL — ABNORMAL HIGH (ref 30.0–36.0)
RBC: 3.84 M/uL — ABNORMAL LOW (ref 3.87–5.11)
RDW: 13.2 % (ref 11.5–15.5)
Total Bilirubin: 1 mg/dL (ref 0.3–1.2)
Total Protein: 7.3 g/dL (ref 6.0–8.3)

## 2007-10-16 ENCOUNTER — Encounter (INDEPENDENT_AMBULATORY_CARE_PROVIDER_SITE_OTHER): Payer: Self-pay | Admitting: Family Medicine

## 2008-01-14 ENCOUNTER — Encounter (INDEPENDENT_AMBULATORY_CARE_PROVIDER_SITE_OTHER): Payer: Self-pay | Admitting: Family Medicine

## 2008-01-14 ENCOUNTER — Ambulatory Visit: Payer: Self-pay | Admitting: Family Medicine

## 2008-01-14 LAB — CONVERTED CEMR LAB

## 2008-01-21 ENCOUNTER — Encounter (INDEPENDENT_AMBULATORY_CARE_PROVIDER_SITE_OTHER): Payer: Self-pay | Admitting: *Deleted

## 2008-01-29 ENCOUNTER — Encounter: Admission: RE | Admit: 2008-01-29 | Discharge: 2008-01-29 | Payer: Self-pay | Admitting: Family Medicine

## 2008-01-29 IMAGING — MG MM SCREEN MAMMOGRAM BILATERAL
5 series · 5 of 5 positions shown · non-contrast
Comparison: Prior studies.

DG SCREEN MAMMOGRAM BILATERAL
Bilateral CC and MLO view(s) were taken.

DIGITAL SCREENING MAMMOGRAM WITH CAD:

[R CC]
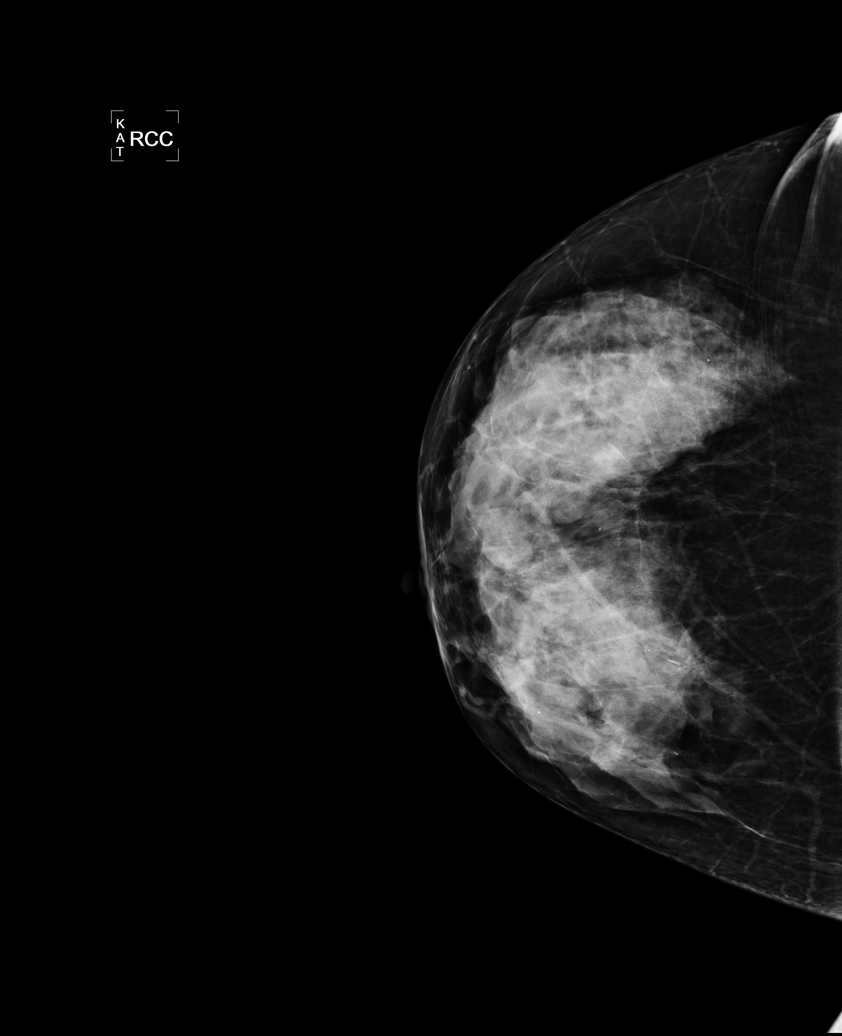

[L CC]
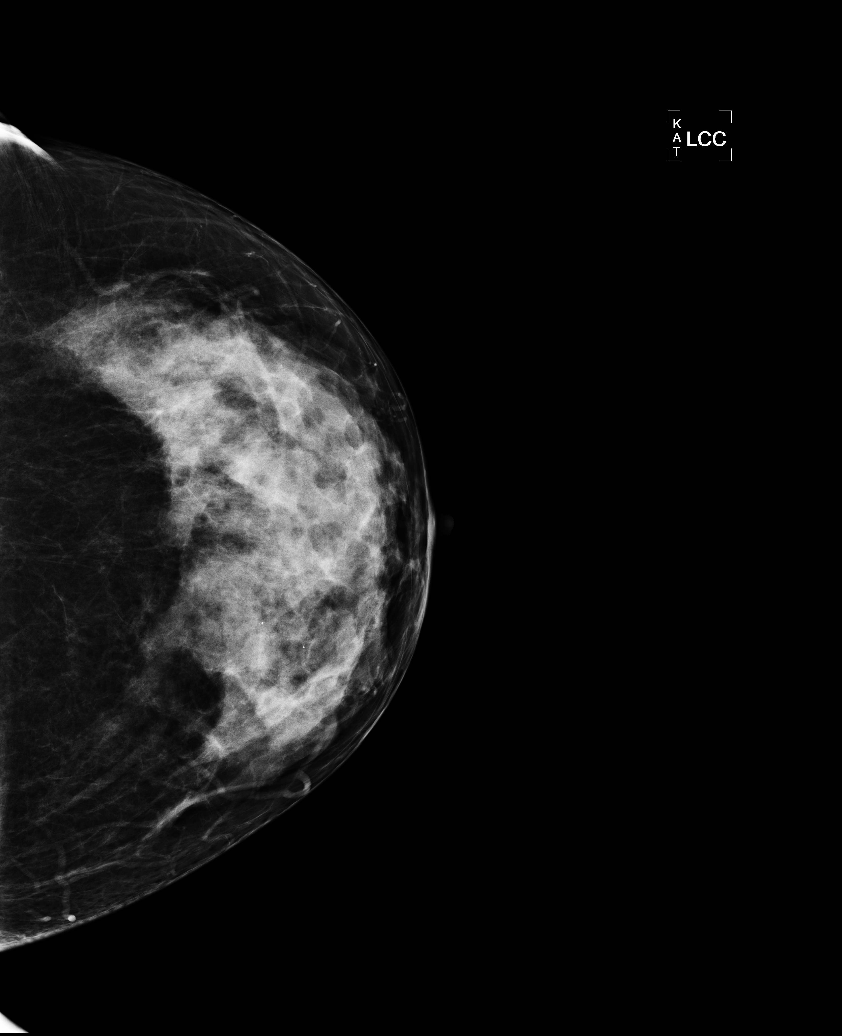

[L MLO]
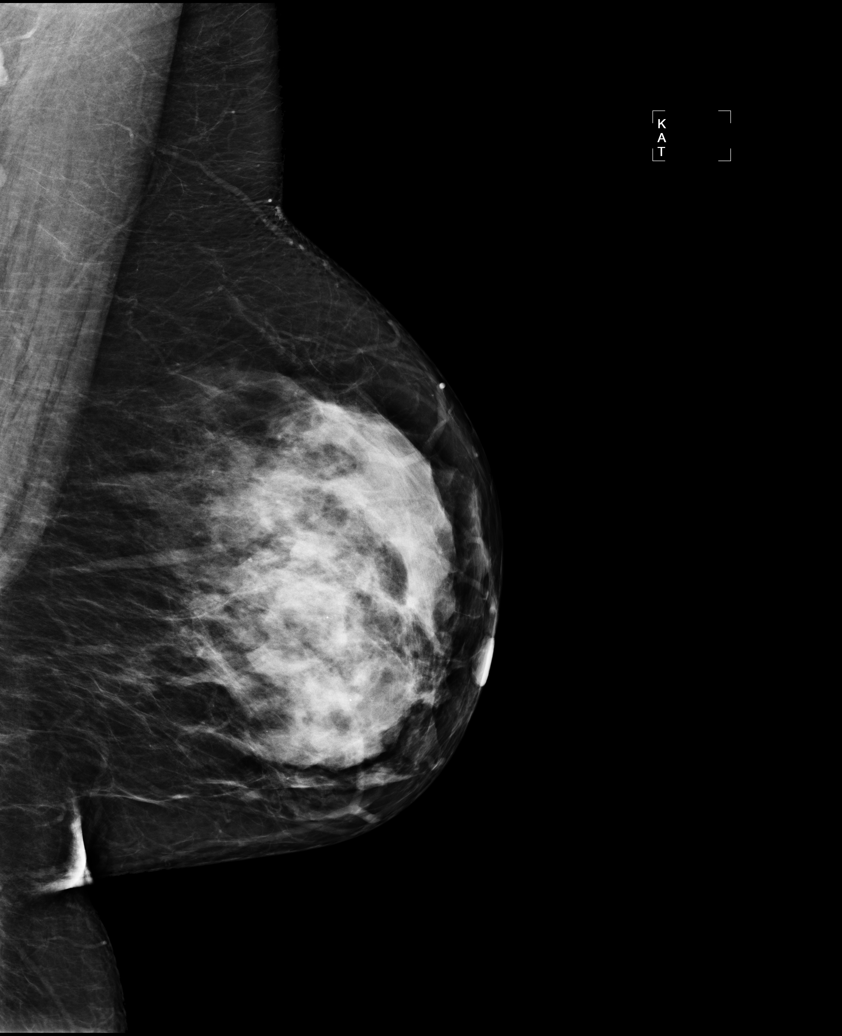

[R MLO (1 of 2)]
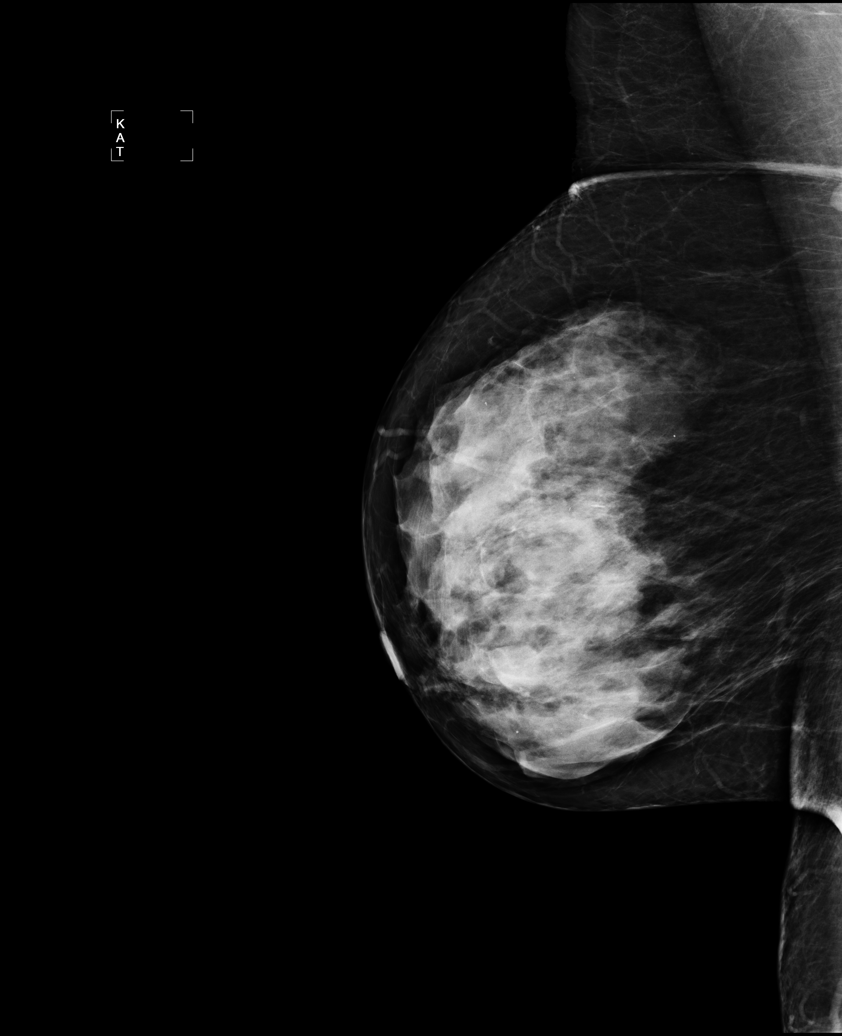

[R MLO (2 of 2)]
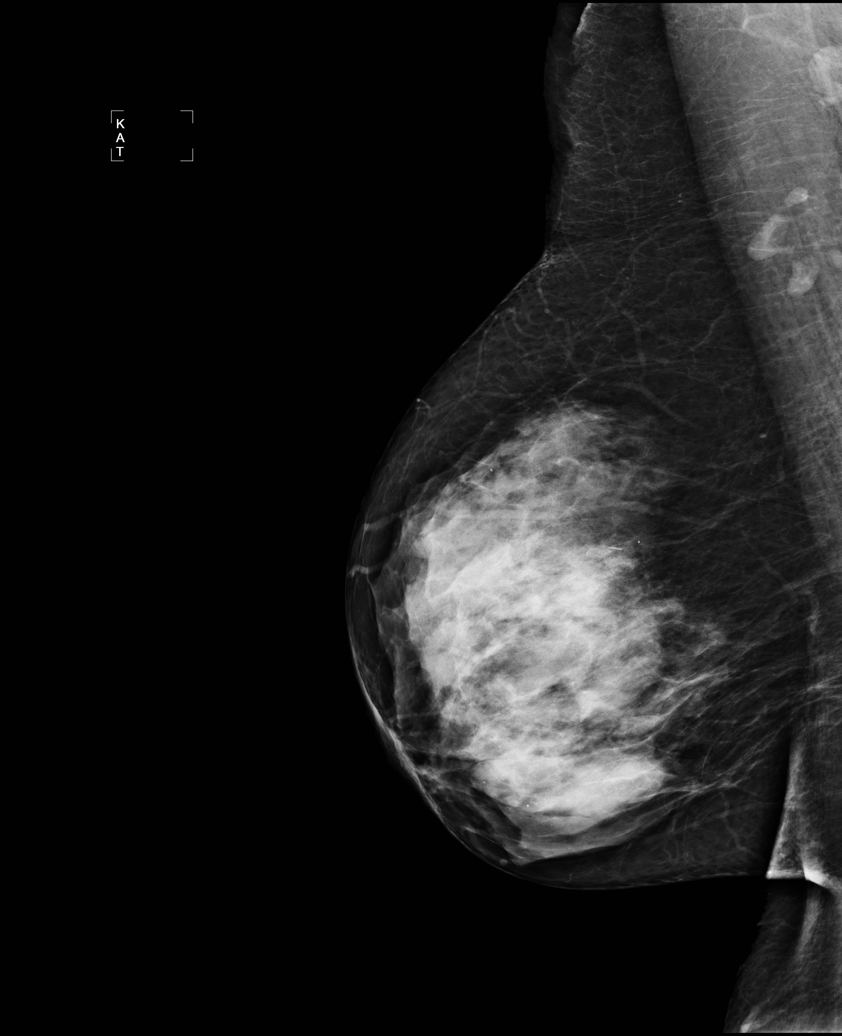

[5 of 5 positions shown; findings below may reference images not displayed]

The breast tissue is extremely dense.  There is no dominant mass, architectural distortion or 
calcification to suggest malignancy.
IMPRESSION: No mammographic evidence of malignancy.  Suggest yearly screening mammography.

ASSESSMENT: Negative - BI-RADS 1

Screening mammogram in 1 year.
ANALYZED BY COMPUTER AIDED DETECTION. , THIS PROCEDURE WAS A DIGITAL MAMMOGRAM.

## 2008-04-05 ENCOUNTER — Ambulatory Visit: Payer: Self-pay | Admitting: Family Medicine

## 2008-04-05 ENCOUNTER — Encounter (INDEPENDENT_AMBULATORY_CARE_PROVIDER_SITE_OTHER): Payer: Self-pay | Admitting: Family Medicine

## 2008-04-05 LAB — CONVERTED CEMR LAB
AST: 15 units/L (ref 0–37)
Albumin: 4.3 g/dL (ref 3.5–5.2)
Alkaline Phosphatase: 69 units/L (ref 39–117)
BUN: 13 mg/dL (ref 6–23)
Creatinine, Ser: 0.65 mg/dL (ref 0.40–1.20)
Glucose, Bld: 99 mg/dL (ref 70–99)
HCT: 34.1 % — ABNORMAL LOW (ref 36.0–46.0)
Hemoglobin: 12.1 g/dL (ref 12.0–15.0)
MCHC: 35.5 g/dL (ref 30.0–36.0)
MCV: 85.7 fL (ref 78.0–100.0)
Potassium: 3.9 meq/L (ref 3.5–5.3)
RBC: 3.98 M/uL (ref 3.87–5.11)
RDW: 13.4 % (ref 11.5–15.5)
Total Bilirubin: 1 mg/dL (ref 0.3–1.2)

## 2008-04-06 ENCOUNTER — Encounter (INDEPENDENT_AMBULATORY_CARE_PROVIDER_SITE_OTHER): Payer: Self-pay | Admitting: Family Medicine

## 2008-04-19 ENCOUNTER — Encounter: Payer: Self-pay | Admitting: *Deleted

## 2008-05-05 ENCOUNTER — Ambulatory Visit: Payer: Self-pay | Admitting: Gastroenterology

## 2008-05-11 ENCOUNTER — Ambulatory Visit: Payer: Self-pay | Admitting: Gastroenterology

## 2008-06-03 ENCOUNTER — Ambulatory Visit: Payer: Self-pay | Admitting: Family Medicine

## 2008-08-12 ENCOUNTER — Encounter (INDEPENDENT_AMBULATORY_CARE_PROVIDER_SITE_OTHER): Payer: Self-pay | Admitting: Family Medicine

## 2008-08-12 ENCOUNTER — Ambulatory Visit: Payer: Self-pay | Admitting: Family Medicine

## 2008-08-12 LAB — CONVERTED CEMR LAB: TSH: 1.158 microintl units/mL (ref 0.350–4.50)

## 2008-09-06 ENCOUNTER — Telehealth (INDEPENDENT_AMBULATORY_CARE_PROVIDER_SITE_OTHER): Payer: Self-pay | Admitting: Family Medicine

## 2008-09-29 ENCOUNTER — Encounter (INDEPENDENT_AMBULATORY_CARE_PROVIDER_SITE_OTHER): Payer: Self-pay | Admitting: Family Medicine

## 2008-09-29 ENCOUNTER — Ambulatory Visit: Payer: Self-pay | Admitting: Family Medicine

## 2008-09-29 LAB — CONVERTED CEMR LAB
ALT: 13 units/L (ref 0–35)
AST: 16 units/L (ref 0–37)
Albumin: 4.4 g/dL (ref 3.5–5.2)
Alkaline Phosphatase: 82 units/L (ref 39–117)
BUN: 14 mg/dL (ref 6–23)
Calcium: 9 mg/dL (ref 8.4–10.5)
Chloride: 107 meq/L (ref 96–112)
Cholesterol: 165 mg/dL (ref 0–200)
HDL: 55 mg/dL (ref >39)
LDL Cholesterol: 91 mg/dL (ref 0–99)
Potassium: 4.2 meq/L (ref 3.5–5.3)
Sodium: 138 meq/L (ref 135–145)
Total Protein: 6.9 g/dL (ref 6.0–8.3)
Triglycerides: 96 mg/dL (ref <150)

## 2008-09-30 ENCOUNTER — Encounter (INDEPENDENT_AMBULATORY_CARE_PROVIDER_SITE_OTHER): Payer: Self-pay | Admitting: Family Medicine

## 2008-12-02 ENCOUNTER — Emergency Department (HOSPITAL_COMMUNITY): Admission: EM | Admit: 2008-12-02 | Discharge: 2008-12-02 | Payer: Self-pay | Admitting: Emergency Medicine

## 2008-12-29 ENCOUNTER — Telehealth: Payer: Self-pay | Admitting: *Deleted

## 2009-03-14 ENCOUNTER — Encounter: Admission: RE | Admit: 2009-03-14 | Discharge: 2009-03-14 | Payer: Self-pay | Admitting: Family Medicine

## 2009-03-14 IMAGING — MG MM SCREEN MAMMOGRAM BILATERAL
4 series · 4 of 4 positions shown · non-contrast
Comparison: none

DG SCREEN MAMMOGRAM BILATERAL
Bilateral CC and MLO view(s) were taken.

DIGITAL SCREENING MAMMOGRAM WITH CAD:
The breast tissue is extremely dense.  No masses or malignant type calcifications are identified.  
Compared with prior studies.
Images were processed with CAD.

[R CC]
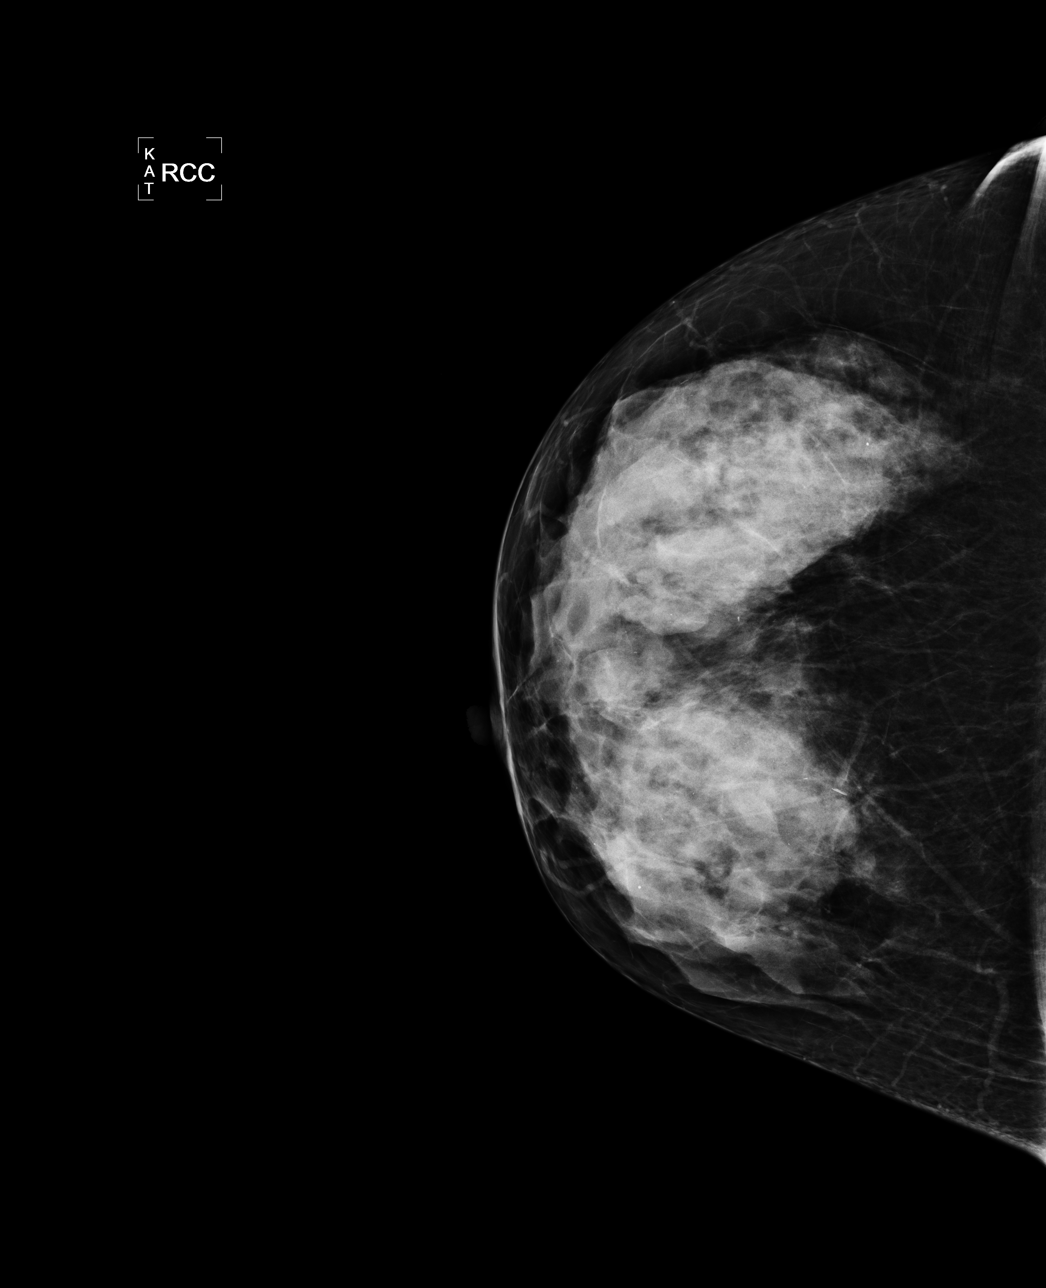

[L CC]
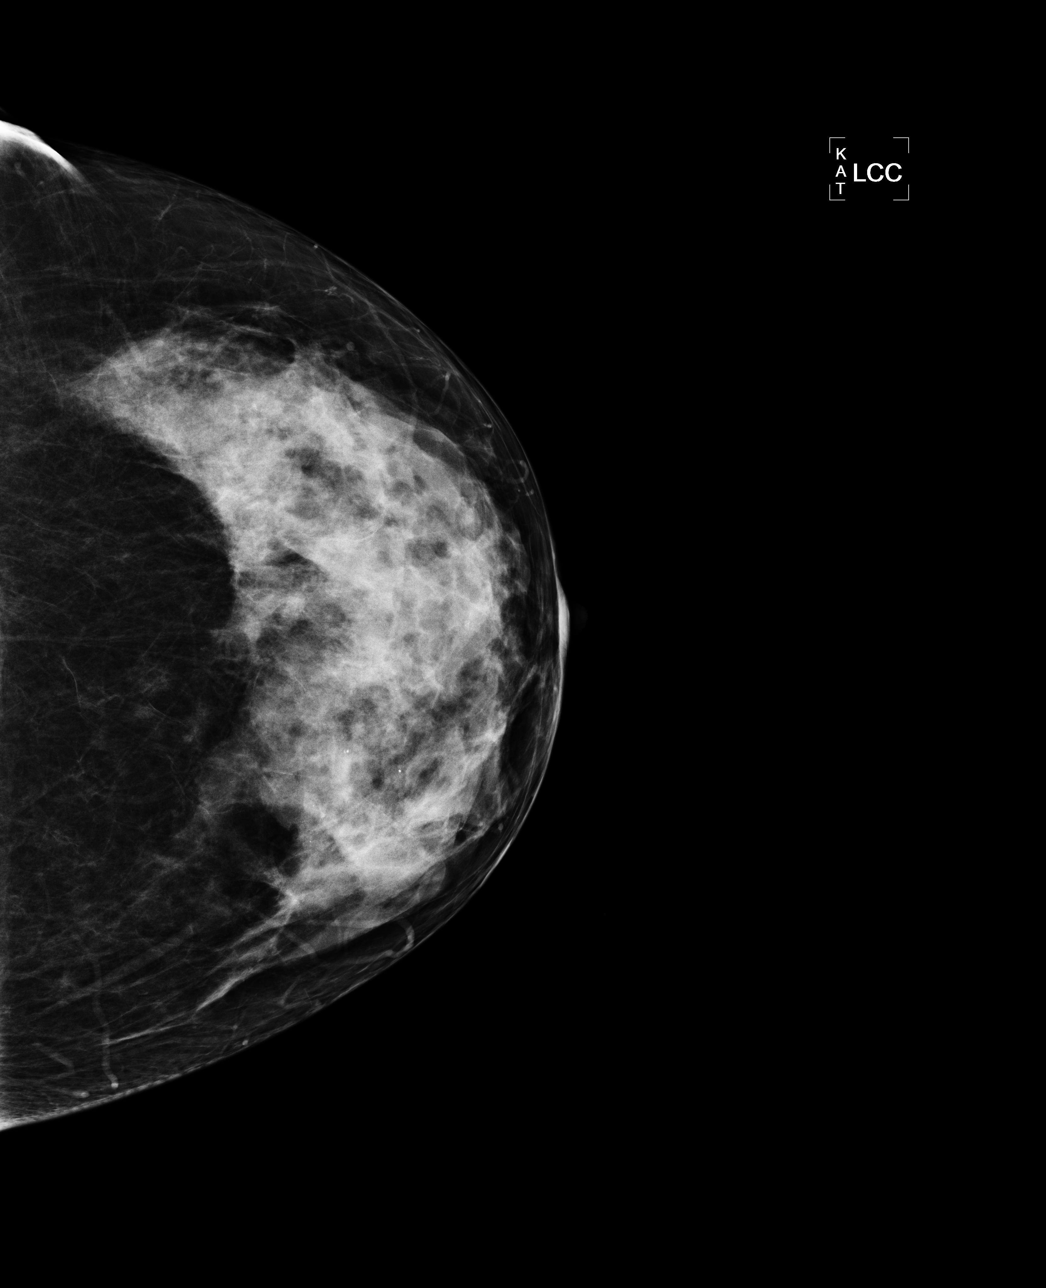

[L MLO]
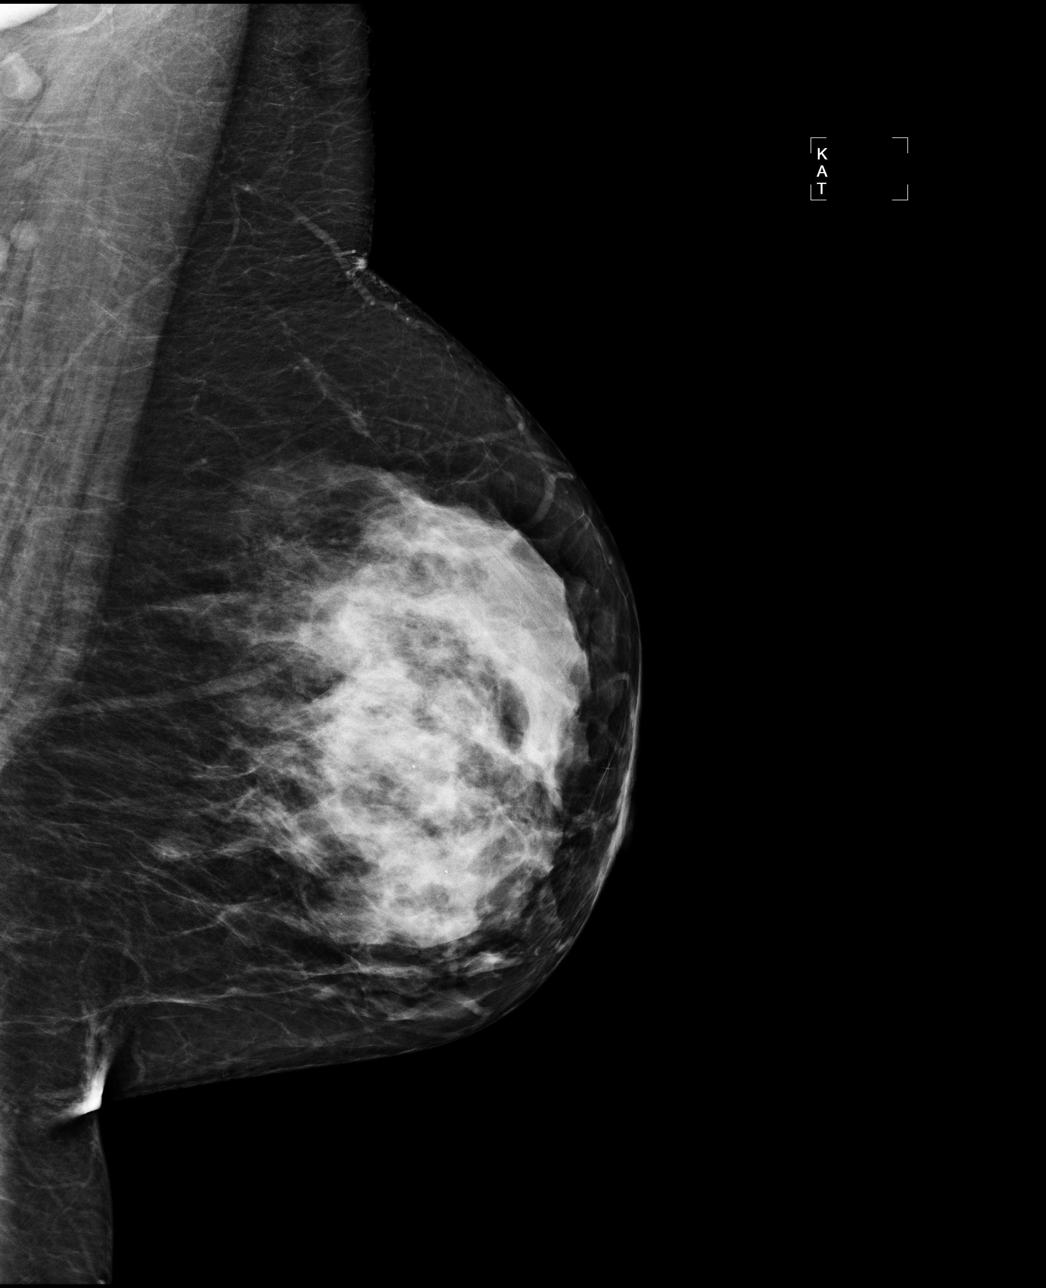

[R MLO]
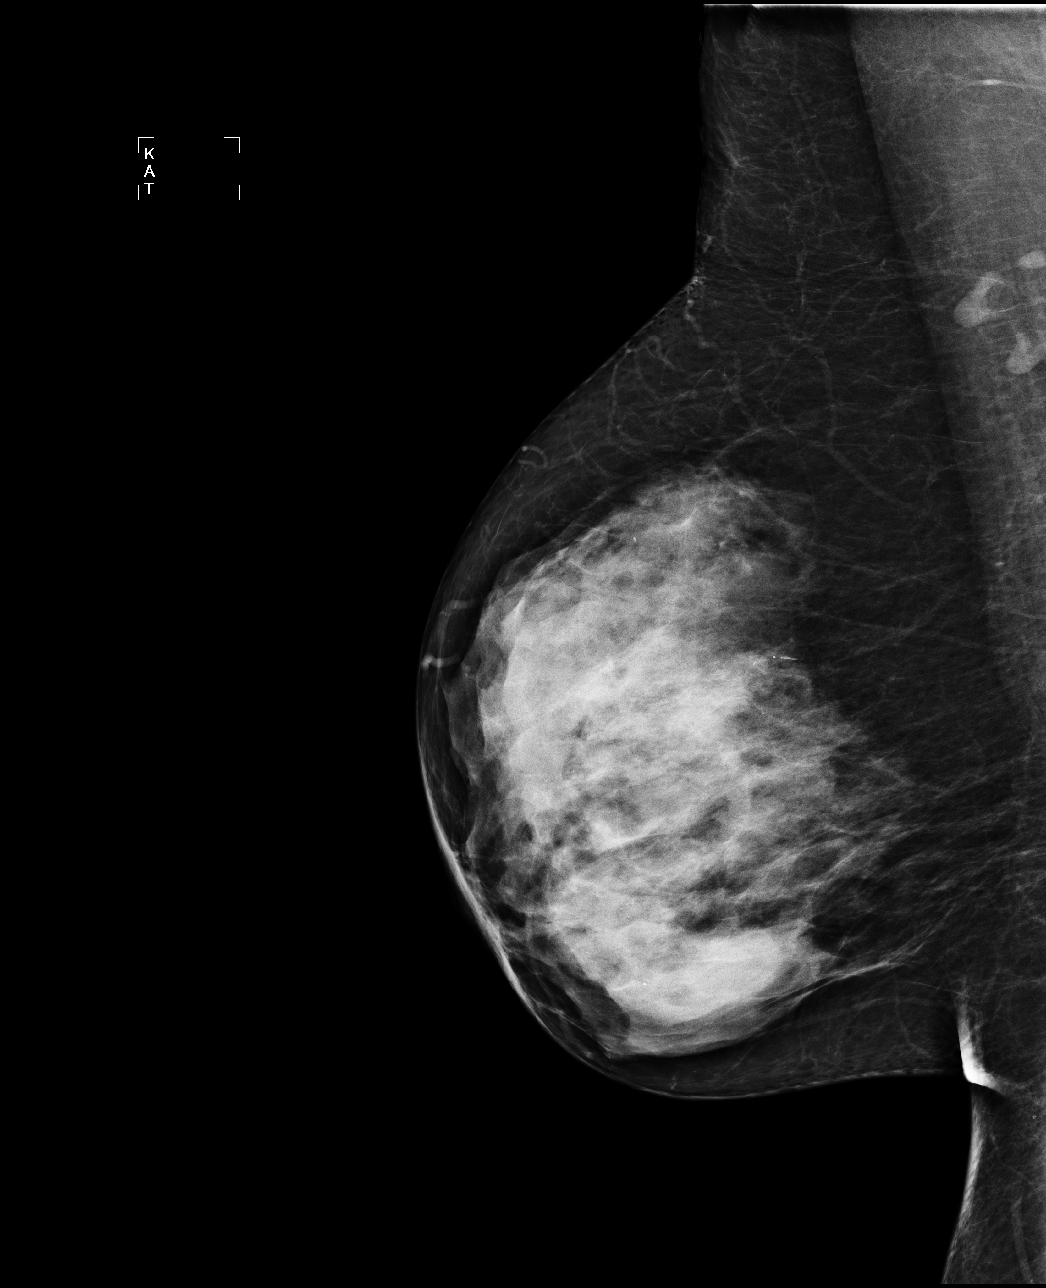

[4 of 4 positions shown; findings below may reference images not displayed]

IMPRESSION: No specific mammographic evidence of malignancy.  Next screening mammogram is recommended in one 
year.

A result letter of this screening mammogram will be mailed directly to the patient.

ASSESSMENT: Negative - BI-RADS 1

Screening mammogram in 1 year.
,

## 2009-03-18 ENCOUNTER — Telehealth: Payer: Self-pay | Admitting: *Deleted

## 2009-03-22 ENCOUNTER — Encounter: Payer: Self-pay | Admitting: Sports Medicine

## 2009-03-22 ENCOUNTER — Ambulatory Visit (HOSPITAL_COMMUNITY): Admission: RE | Admit: 2009-03-22 | Discharge: 2009-03-22 | Payer: Self-pay | Admitting: Family Medicine

## 2009-03-22 ENCOUNTER — Ambulatory Visit: Payer: Self-pay | Admitting: Family Medicine

## 2009-03-22 DIAGNOSIS — I10 Essential (primary) hypertension: Secondary | ICD-10-CM | POA: Insufficient documentation

## 2009-03-22 LAB — CONVERTED CEMR LAB
BUN: 13 mg/dL (ref 6–23)
CO2: 28 meq/L (ref 19–32)
Calcium: 9.6 mg/dL (ref 8.4–10.5)
Chloride: 101 meq/L (ref 96–112)
Creatinine, Ser: 0.71 mg/dL (ref 0.40–1.20)
Glucose, Bld: 99 mg/dL (ref 70–99)
Potassium: 4.1 meq/L (ref 3.5–5.3)
Sodium: 138 meq/L (ref 135–145)
TSH: 1.233 u[IU]/mL (ref 0.350–4.500)

## 2009-04-06 ENCOUNTER — Encounter: Payer: Self-pay | Admitting: Sports Medicine

## 2009-04-13 ENCOUNTER — Ambulatory Visit: Payer: Self-pay | Admitting: Family Medicine

## 2009-05-31 ENCOUNTER — Telehealth: Payer: Self-pay | Admitting: Sports Medicine

## 2009-10-31 ENCOUNTER — Emergency Department (HOSPITAL_COMMUNITY): Admission: EM | Admit: 2009-10-31 | Discharge: 2009-10-31 | Payer: Self-pay | Admitting: Emergency Medicine

## 2009-10-31 IMAGING — CR DG FOOT COMPLETE 3+V*R*
3 series · 3 of 3 positions shown · non-contrast
Comparison: None.

CLINICAL DATA: Trauma, pain

RIGHT FOOT COMPLETE - 3+ VIEW

[view not recorded (1 of 3)]
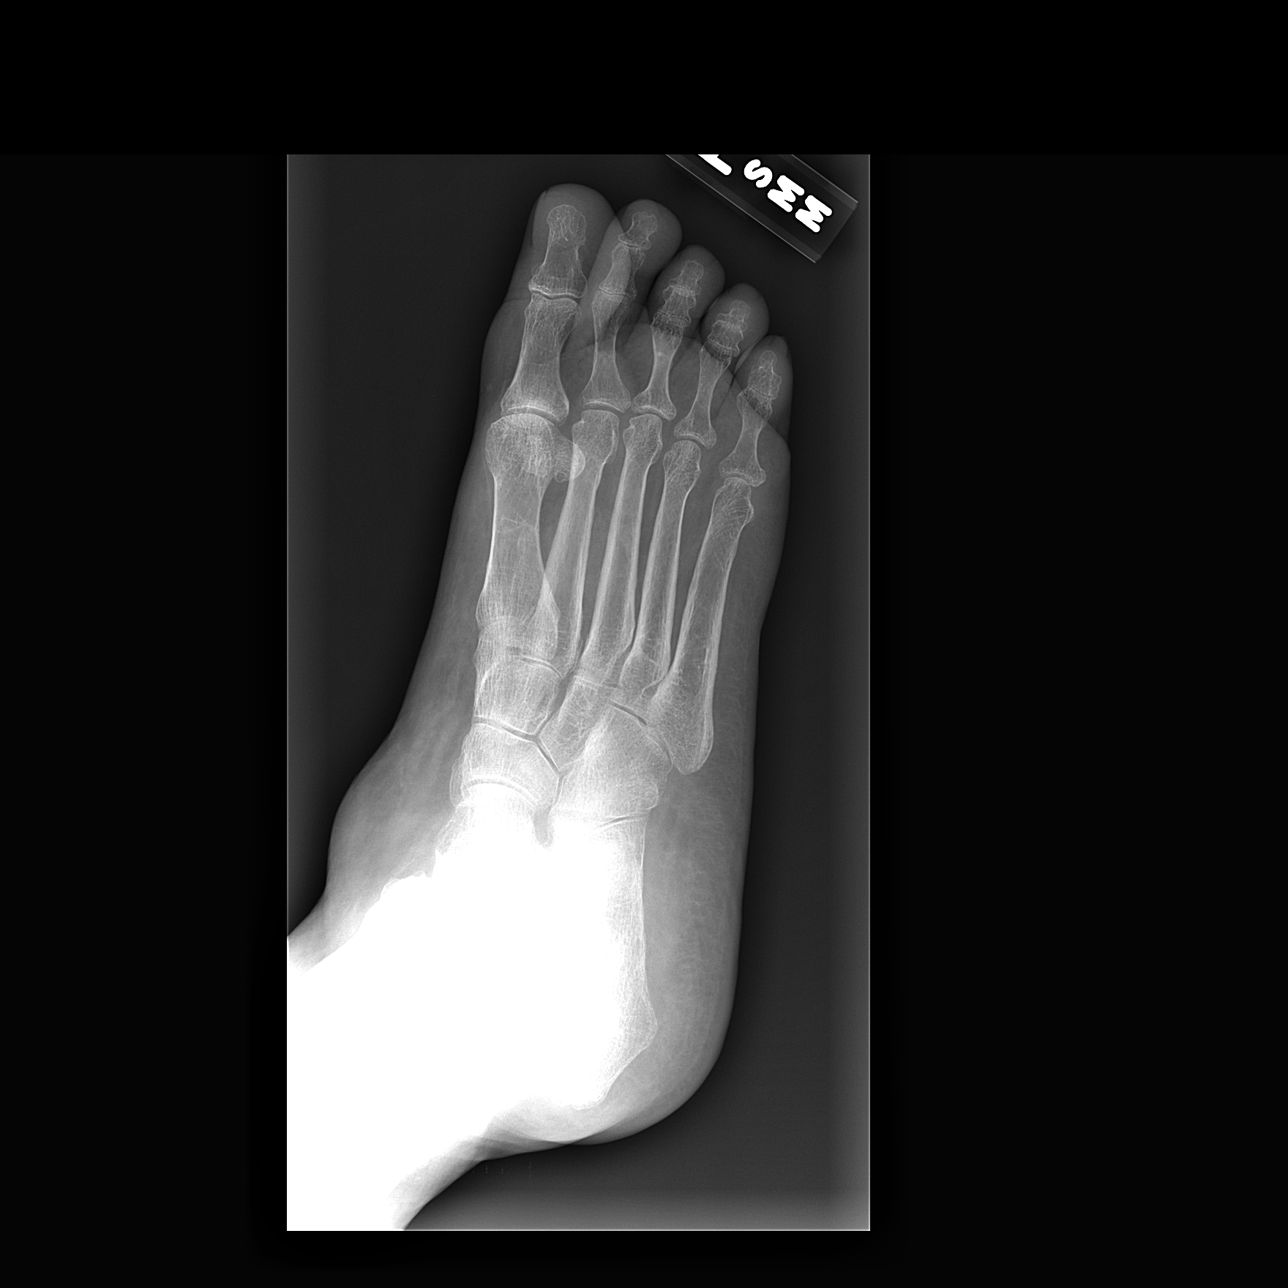

[view not recorded (2 of 3)]
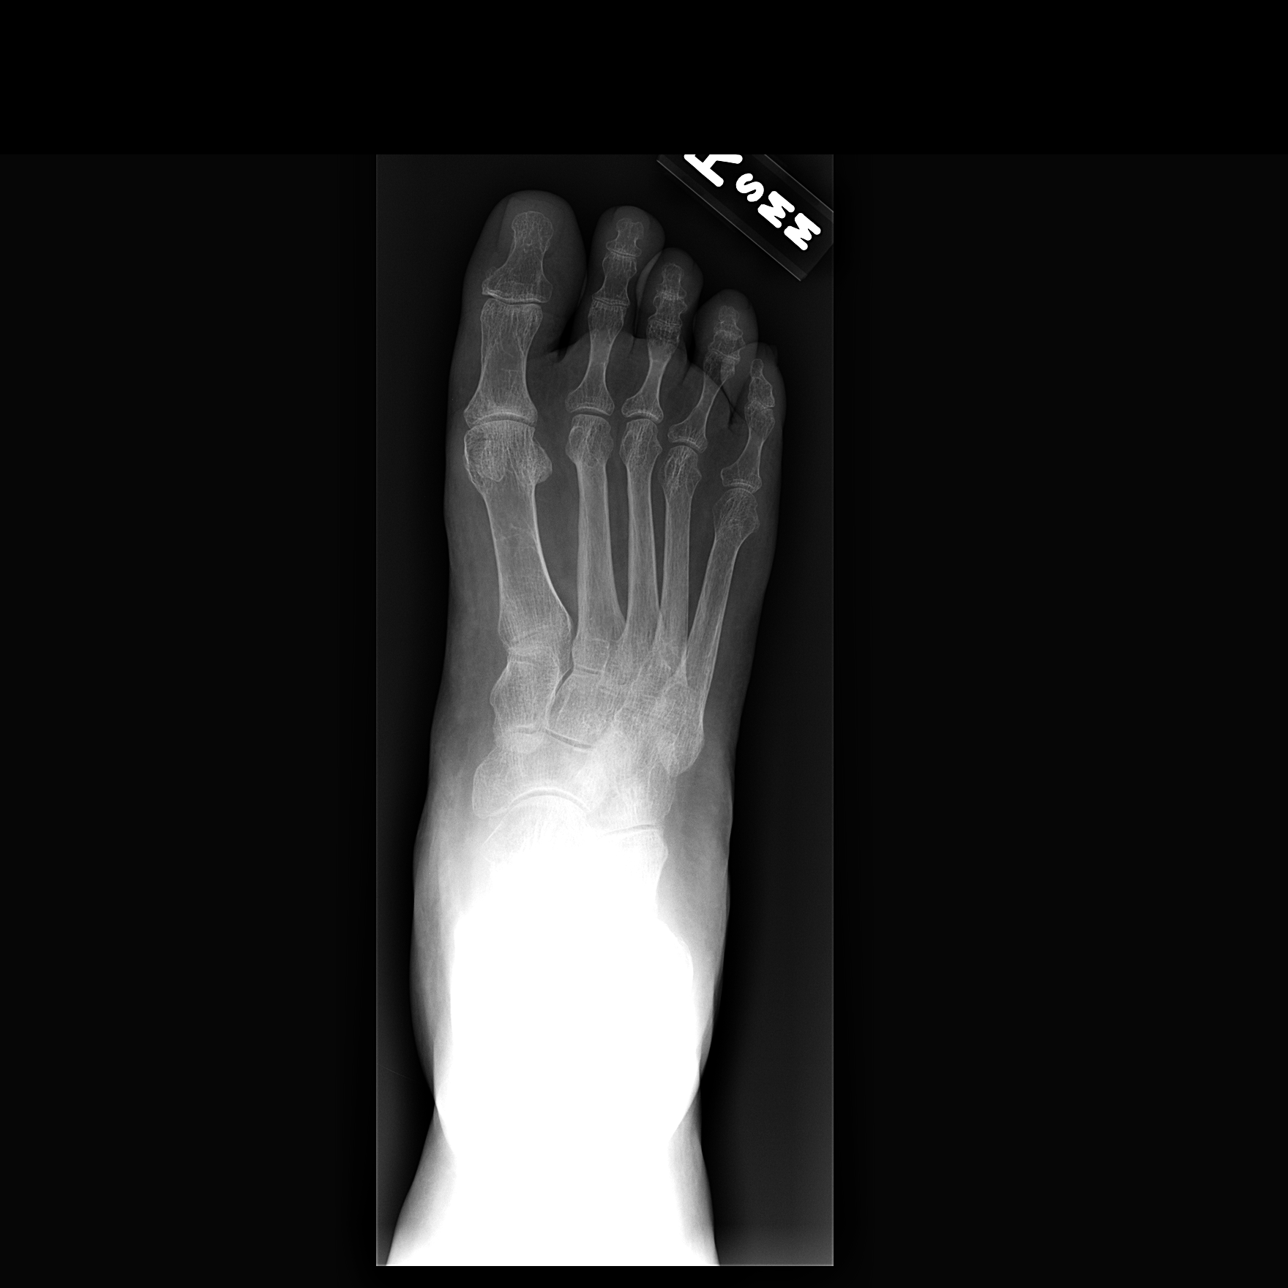

[view not recorded (3 of 3)]
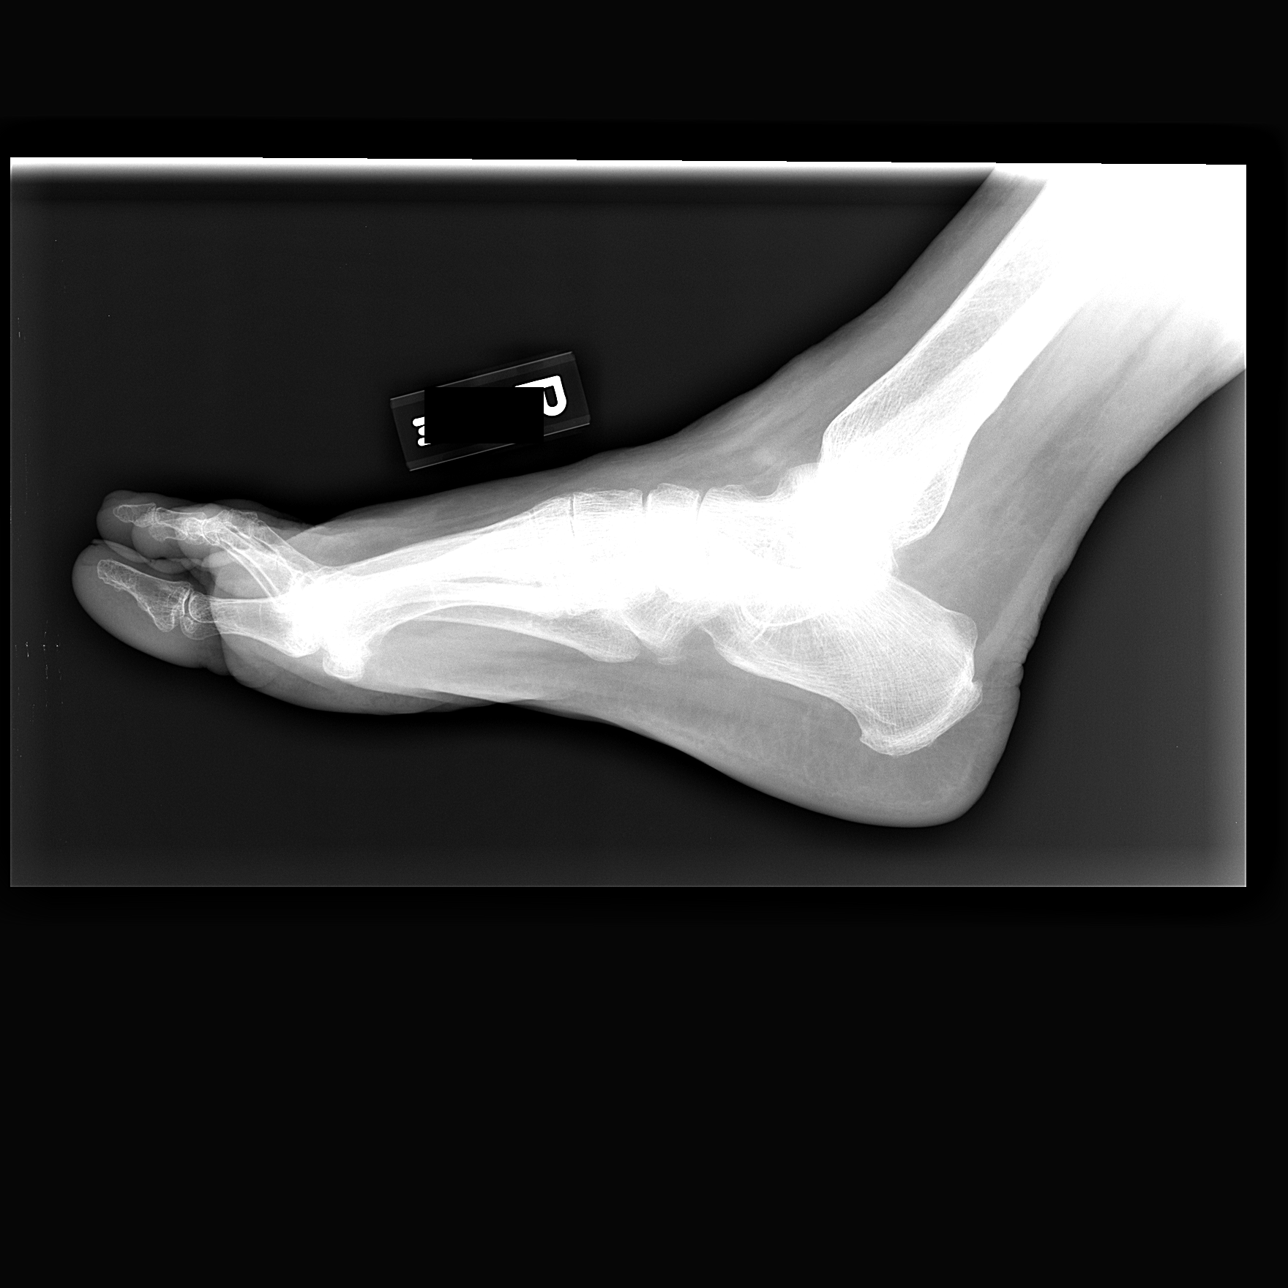

[3 of 3 positions shown; findings below may reference images not displayed]

FINDINGS: Three views of the right foot submitted.  No acute
fracture or subluxation.  Tiny plantar spurring of the calcaneus is
noted.  There is mild posterior spur of the calcaneus at the
Achilles tendon insertion.
IMPRESSION: No acute fracture or subluxation.  Spurring of the calcaneus.

## 2010-03-16 ENCOUNTER — Encounter: Payer: Self-pay | Admitting: Sports Medicine

## 2010-03-16 ENCOUNTER — Ambulatory Visit: Payer: Self-pay | Admitting: Family Medicine

## 2010-03-16 ENCOUNTER — Encounter: Admission: RE | Admit: 2010-03-16 | Discharge: 2010-03-16 | Payer: Self-pay | Admitting: Sports Medicine

## 2010-03-16 ENCOUNTER — Other Ambulatory Visit: Admission: RE | Admit: 2010-03-16 | Discharge: 2010-03-16 | Payer: Self-pay | Admitting: Family Medicine

## 2010-03-16 DIAGNOSIS — L909 Atrophic disorder of skin, unspecified: Secondary | ICD-10-CM

## 2010-03-16 DIAGNOSIS — L919 Hypertrophic disorder of the skin, unspecified: Secondary | ICD-10-CM

## 2010-03-16 HISTORY — DX: Atrophic disorder of skin, unspecified: L90.9

## 2010-03-16 LAB — CONVERTED CEMR LAB
BUN: 11 mg/dL (ref 6–23)
CO2: 28 meq/L (ref 19–32)
Calcium: 9.2 mg/dL (ref 8.4–10.5)
Chloride: 105 meq/L (ref 96–112)
Creatinine, Ser: 0.73 mg/dL (ref 0.40–1.20)
Glucose, Bld: 95 mg/dL (ref 70–99)
Pap Smear: NEGATIVE
Potassium: 4.1 meq/L (ref 3.5–5.3)
Sodium: 141 meq/L (ref 135–145)
TSH: 0.898 microintl units/mL (ref 0.350–4.500)

## 2010-03-16 IMAGING — MG MM DIGITAL SCREENING
4 series · 4 of 4 positions shown · non-contrast
Comparison: none

DG SCREEN MAMMOGRAM BILATERAL
Bilateral CC and MLO view(s) were taken.

DIGITAL SCREENING MAMMOGRAM WITH CAD:
The breast tissue is extremely dense.  No masses or malignant type calcifications are identified.  
Compared with prior studies.
Images were processed with CAD.

[R CC]
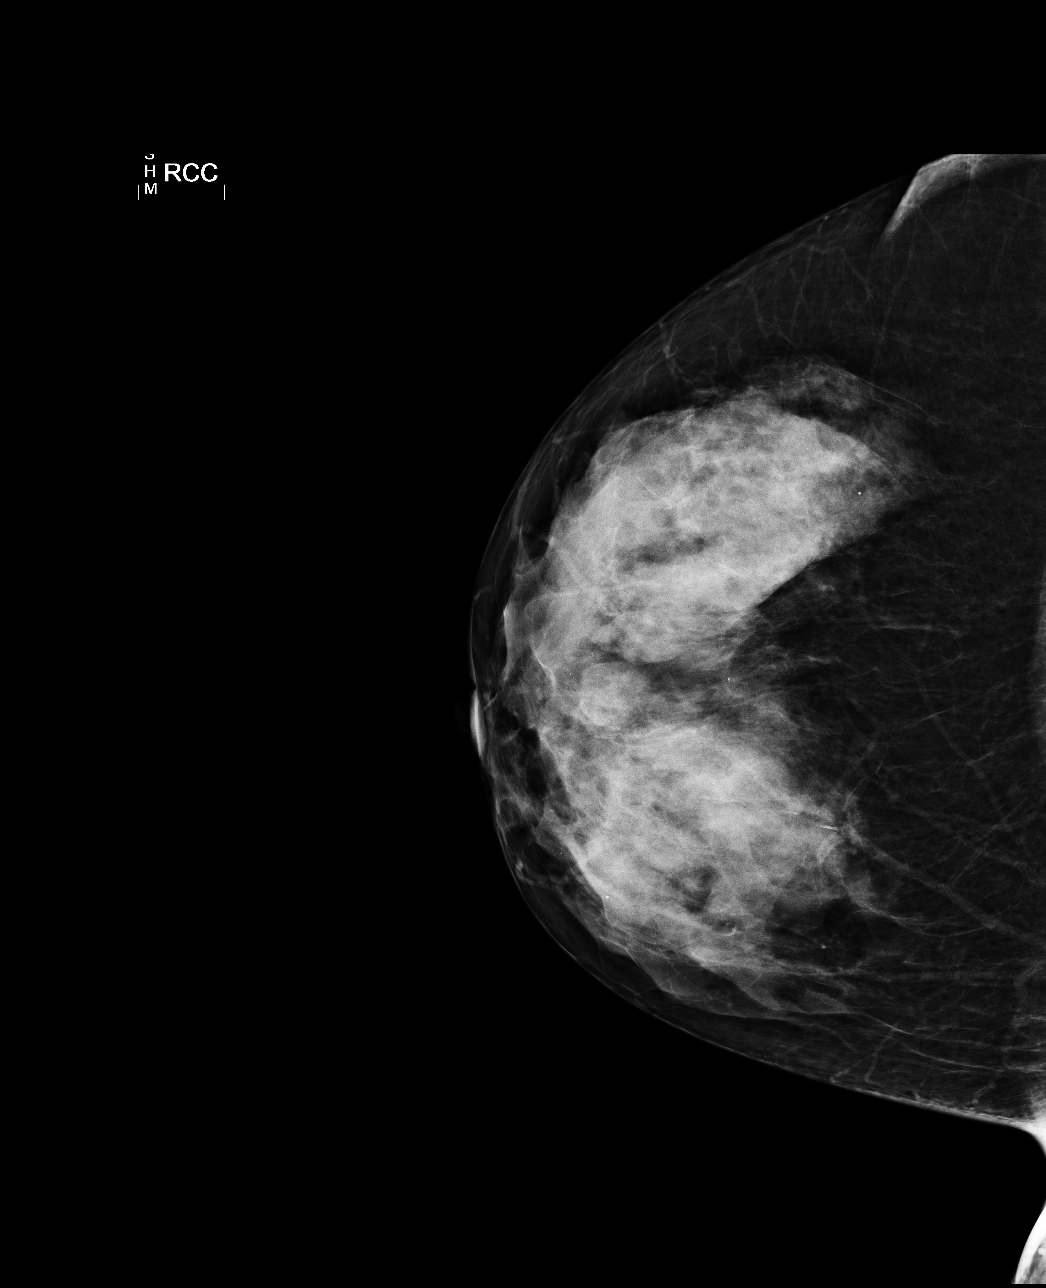

[L CC]
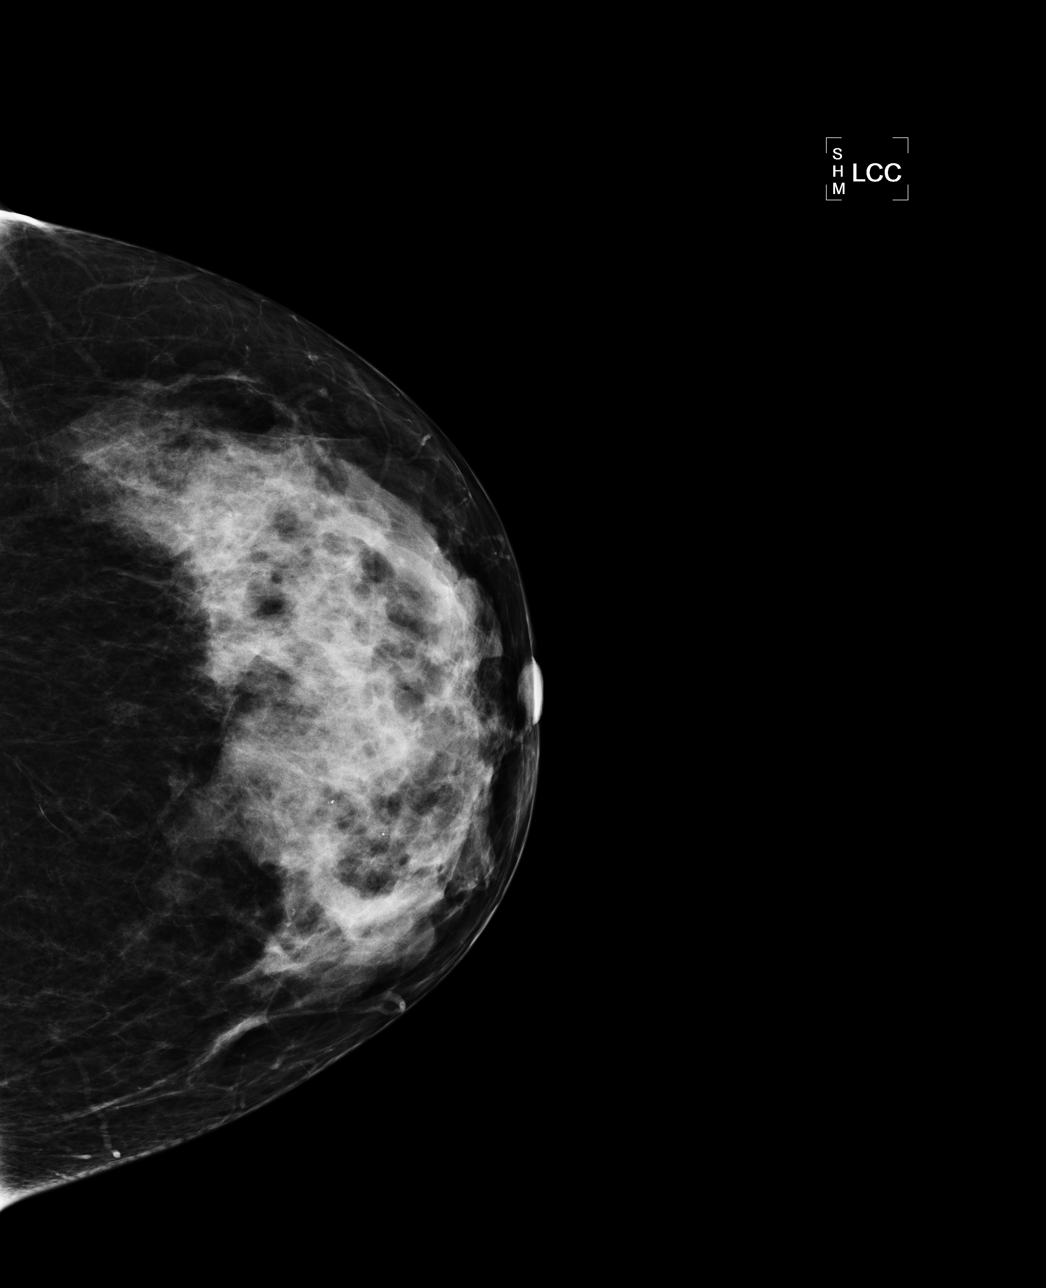

[L MLO]
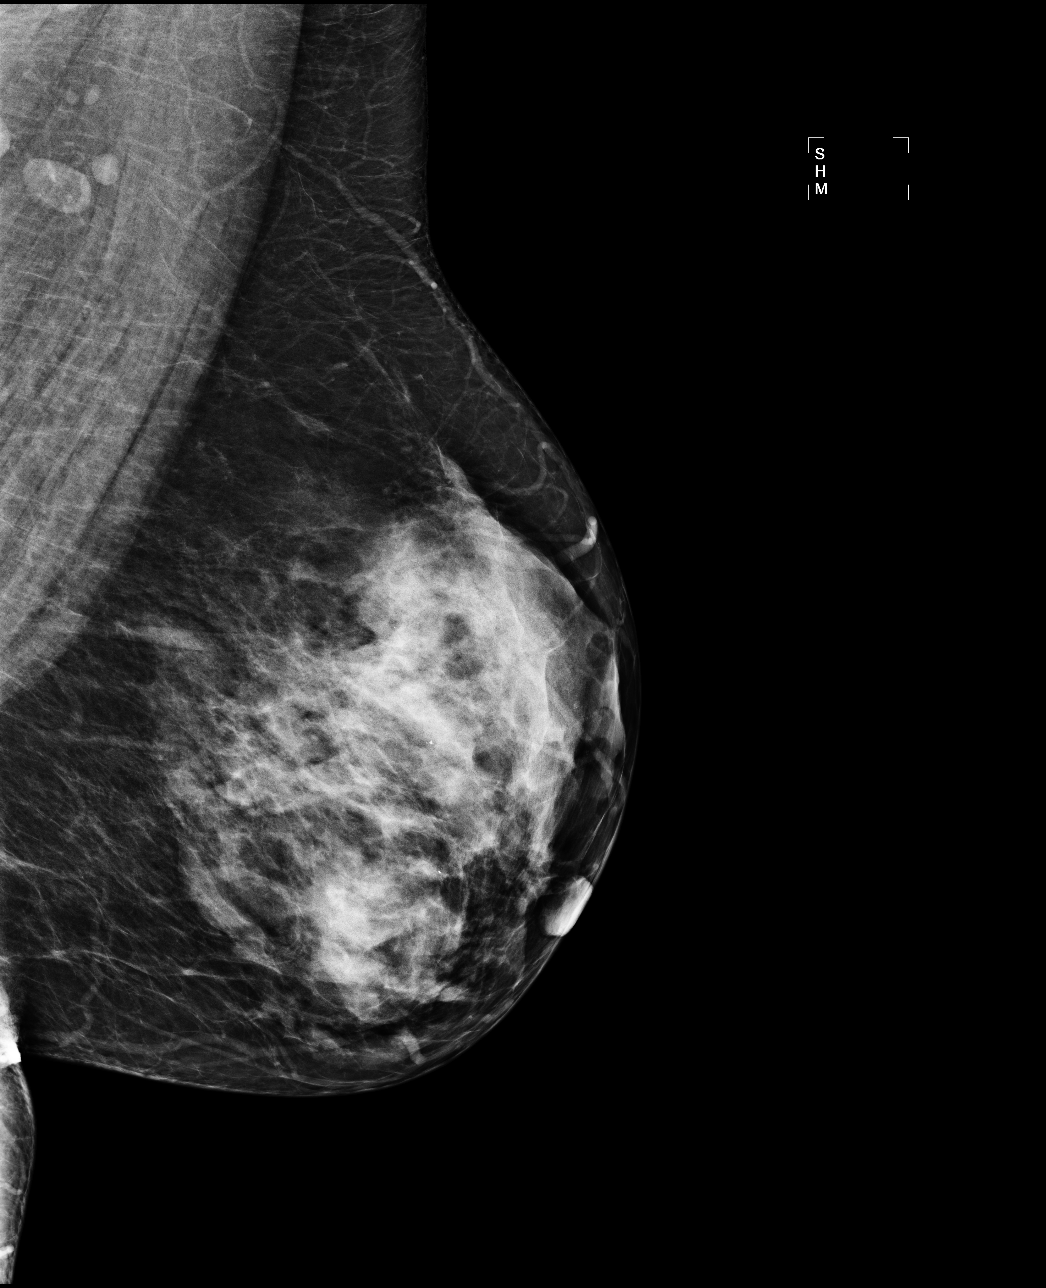

[R MLO]
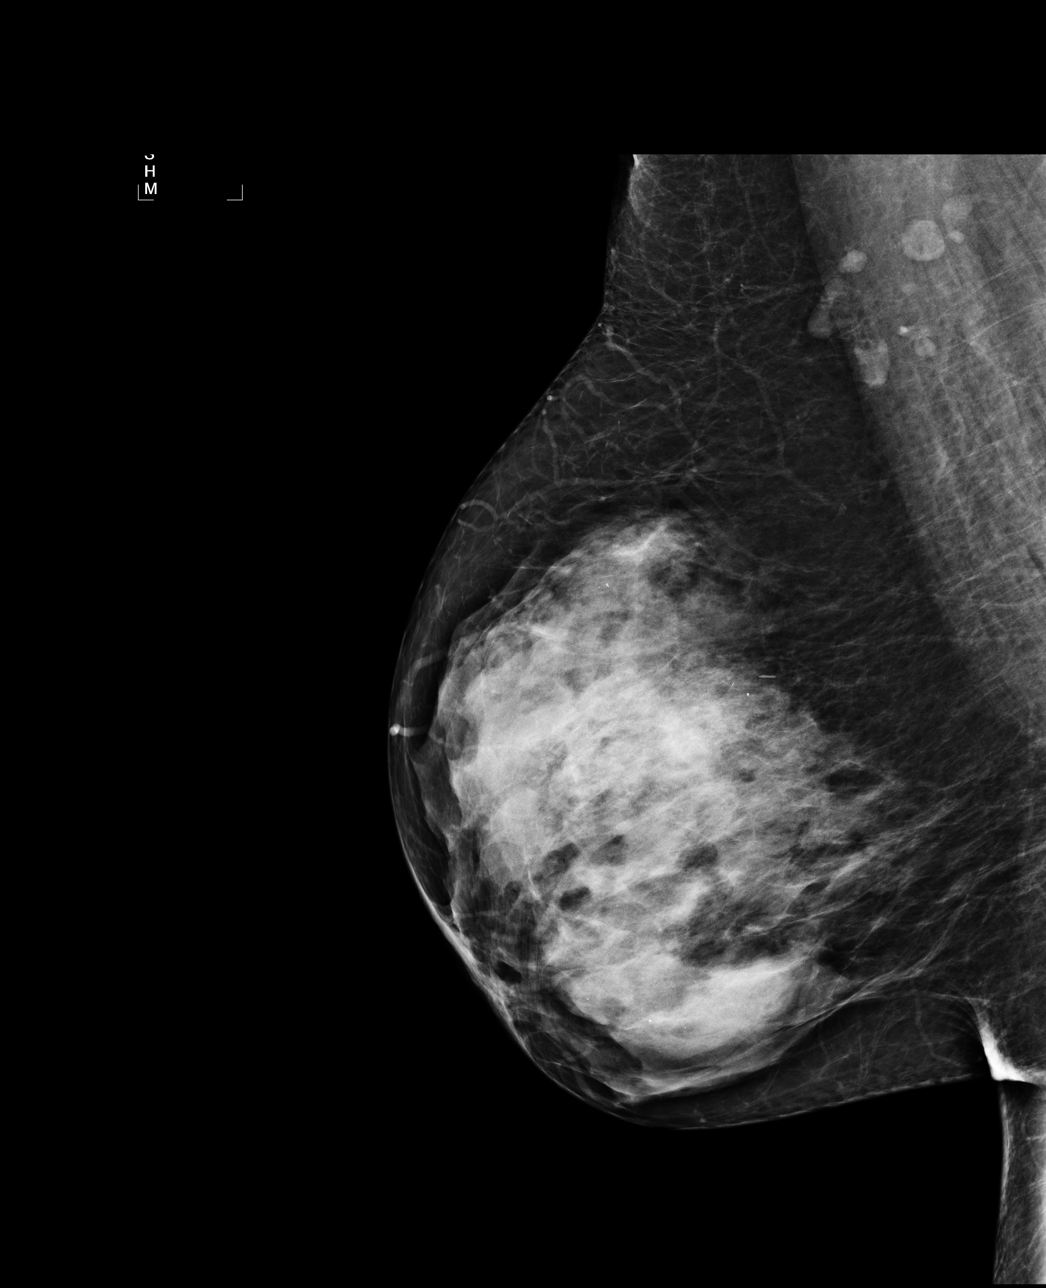

[4 of 4 positions shown; findings below may reference images not displayed]

IMPRESSION: No specific mammographic evidence of malignancy.  Next screening mammogram is recommended in one 
year.

A result letter of this screening mammogram will be mailed directly to the patient.

ASSESSMENT: Benign - BI-RADS 2

Screening mammogram in 1 year.
,

## 2010-03-27 ENCOUNTER — Ambulatory Visit (HOSPITAL_COMMUNITY): Admission: RE | Admit: 2010-03-27 | Discharge: 2010-03-27 | Payer: Self-pay | Admitting: Ophthalmology

## 2010-03-27 IMAGING — CR DG CHEST 2V
2 series · 2 of 2 positions shown · non-contrast
Comparison: [DATE]

CLINICAL DATA: Preop.

CHEST - 2 VIEW

[view not recorded (1 of 2)]
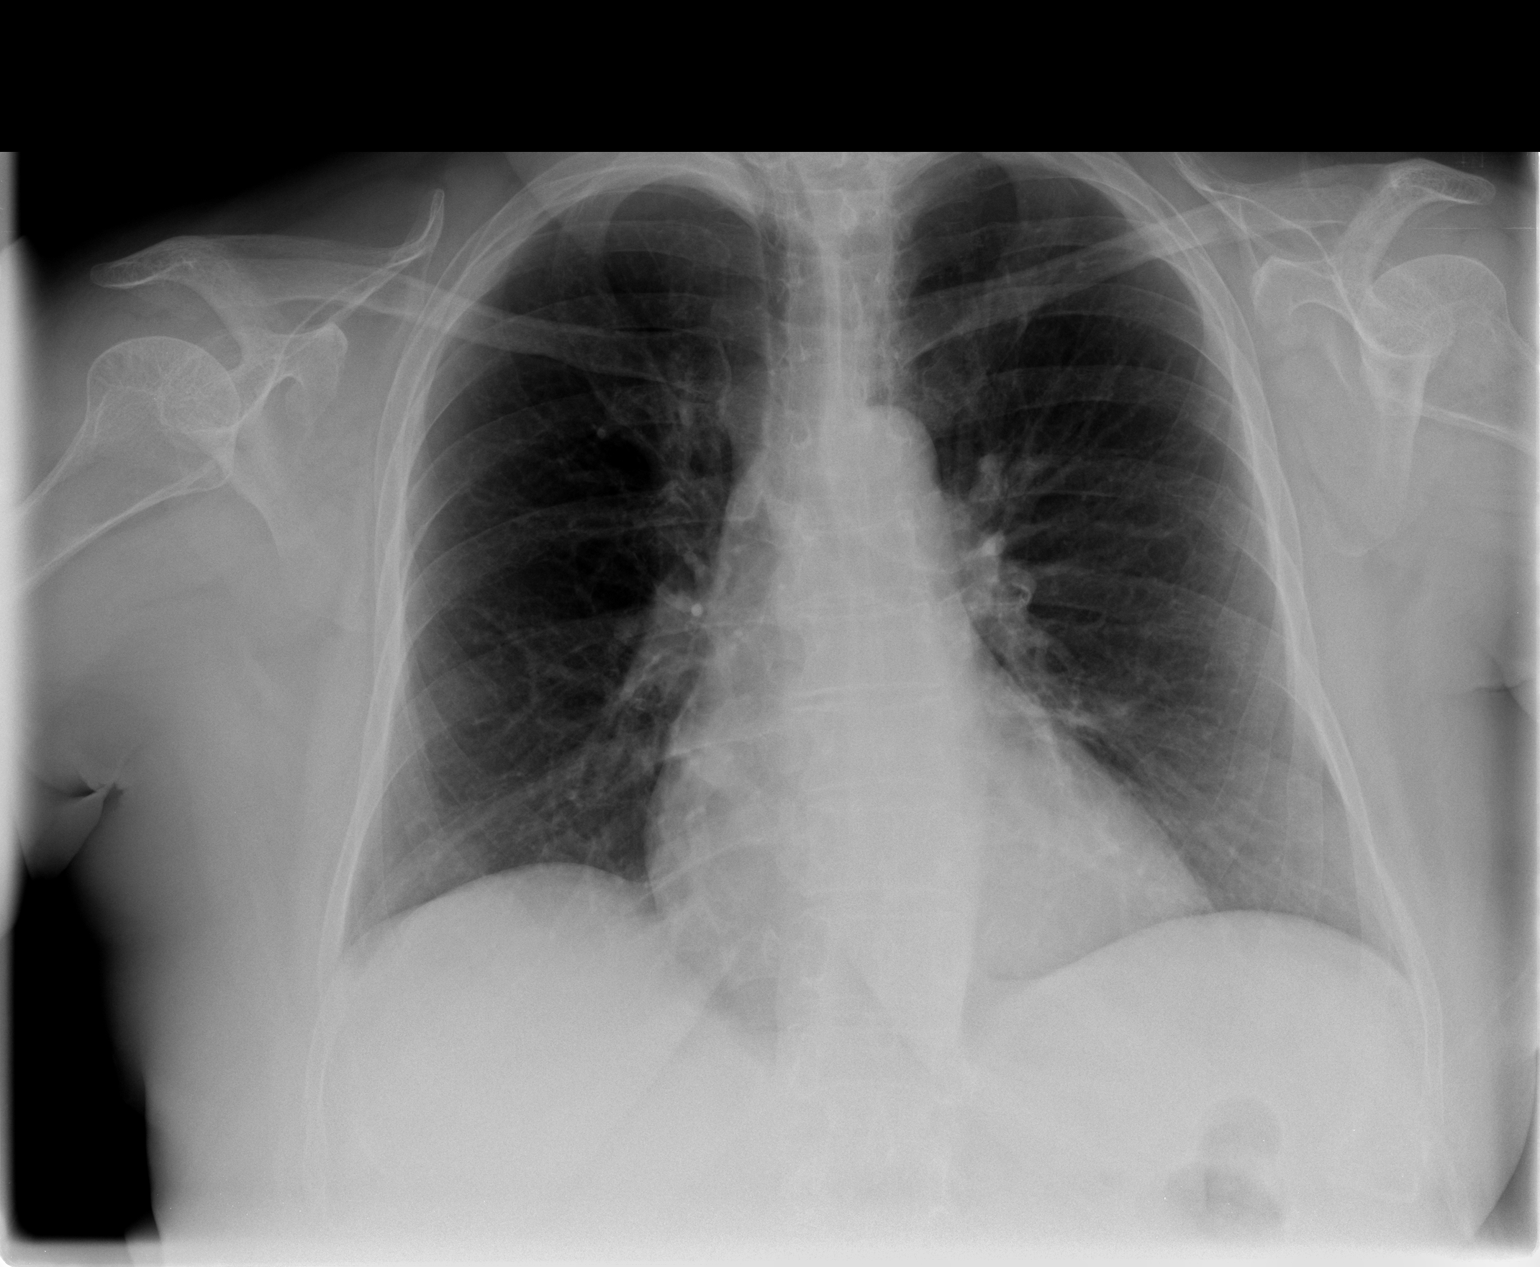

[view not recorded (2 of 2)]
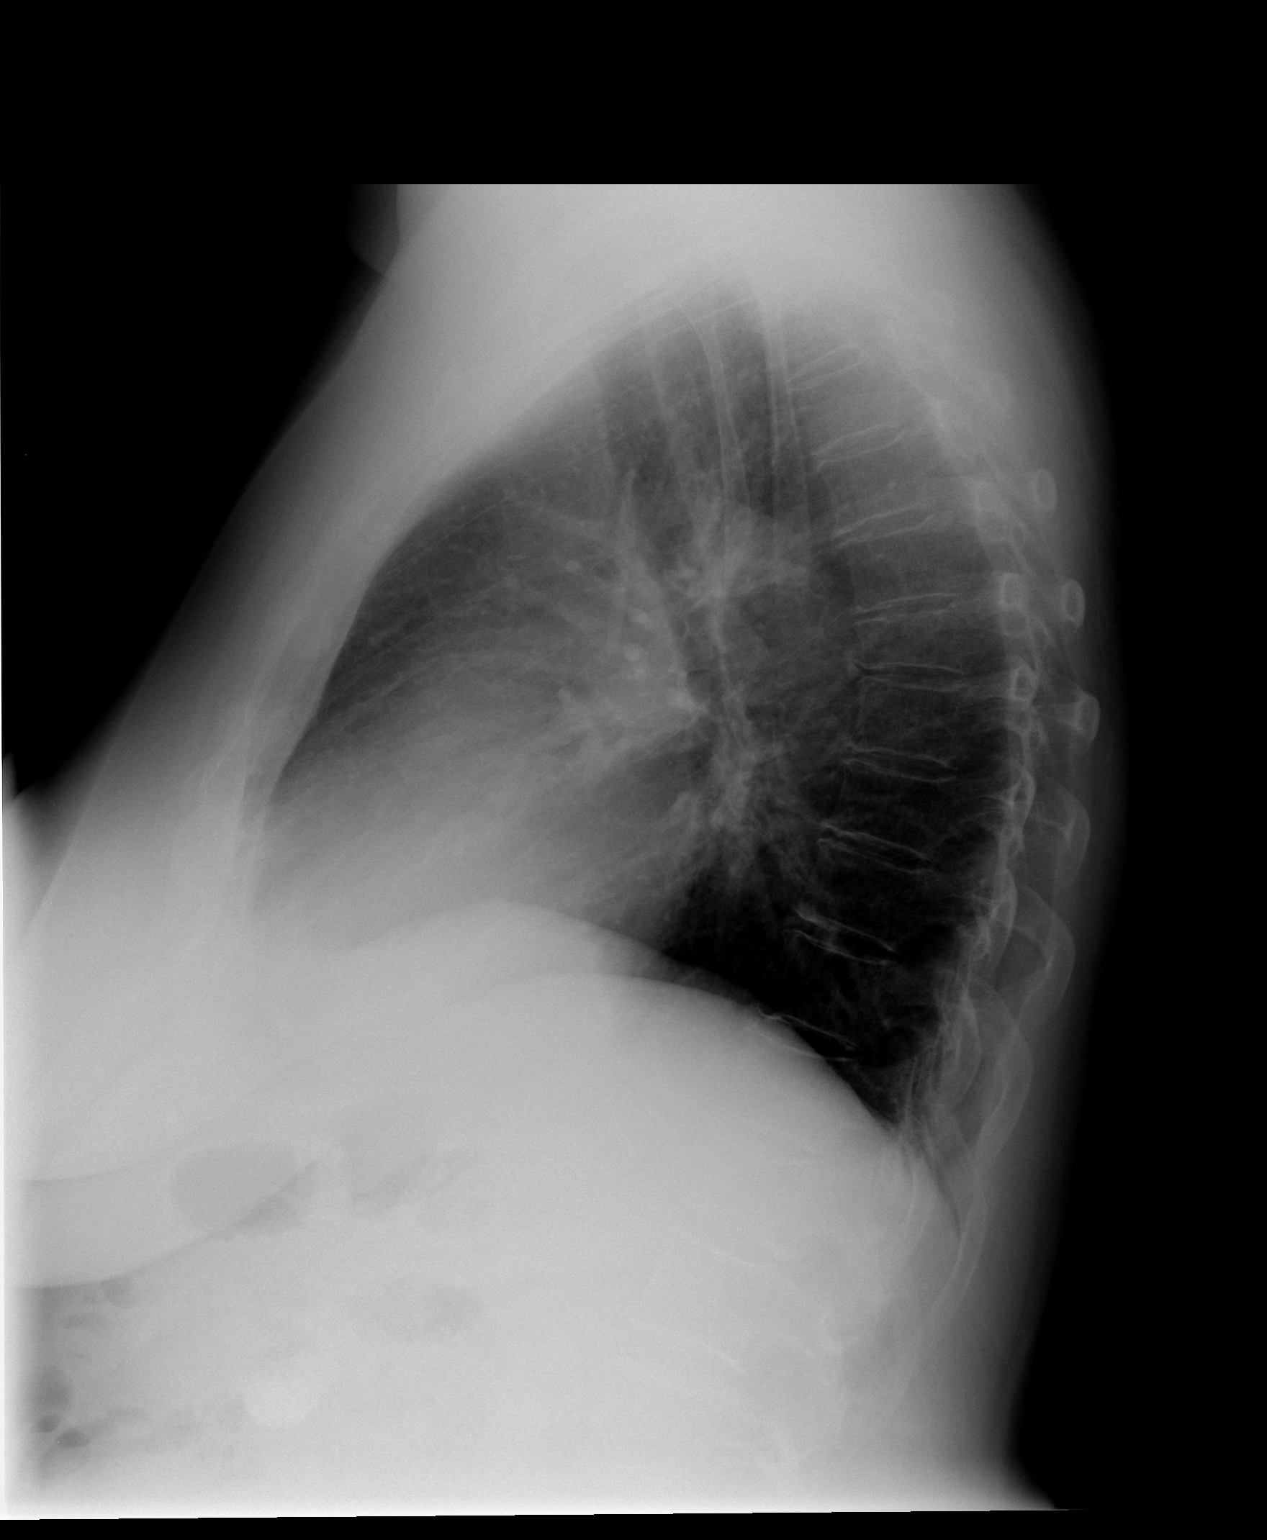

[2 of 2 positions shown; findings below may reference images not displayed]

FINDINGS: The patient is slightly rotated.  Heart size normal.
Lungs are clear.  No pleural fluid. There is mild loss of height of
a mid thoracic vertebral body, age indeterminate.
IMPRESSION: 1.  No acute findings.
2.  Mild loss of height of a mid thoracic vertebral body, age
indeterminate. Per CMS PQRS reporting requirements (PQRS Measure
24): Given the patient's age of greater than 50 and the fracture
site (hip, distal radius, or spine), the patient should be tested
for osteoporosis using DXA, and the appropriate treatment
considered based on the DXA results.

## 2010-05-01 ENCOUNTER — Encounter: Payer: Self-pay | Admitting: Sports Medicine

## 2010-07-16 ENCOUNTER — Encounter: Payer: Self-pay | Admitting: Geriatric Medicine

## 2010-07-17 ENCOUNTER — Encounter: Payer: Self-pay | Admitting: Family Medicine

## 2010-07-27 NOTE — Assessment & Plan Note (Signed)
Summary: cpe,df   Vital Signs:  Patient profile:   60 year old female Height:      62.9 inches Weight:      184.7 pounds BMI:     32.94 Pulse rate:   68 / minute BP sitting:   128 / 68  (right arm)  Vitals Entered By: Arlyss Repress CMA, (March 16, 2010 9:12 AM)  Serial Vital Signs/Assessments:  Time      Position  BP       Pulse  Resp  Temp     By                     128/68                         Rodney Langton MD  CC: physical with pap. has appt for mammogram today. Is Patient Diabetic? No Pain Assessment Patient in pain? no        Primary Care Provider:  Rodney Langton MD  CC:  physical with pap. has appt for mammogram today.Marland Kitchen  History of Present Illness: 60 yo female here for physical and skin tags.  No complaints.  No problems with meds.   Has several skin tags on back and around bra line that bother her and get very irritated through the day.  Habits & Providers  Alcohol-Tobacco-Diet     Tobacco Status: current     Tobacco Counseling: to quit use of tobacco products     Cigarette Packs/Day: 1.0  Current Medications (verified): 1)  Synthroid 125 Mcg  Tabs (Levothyroxine Sodium) .... Take One Tablet Daily. 2)  Propranolol Hcl 20 Mg  Tabs (Propranolol Hcl) .... Take One Tablet By Mouth Four Times A Day 3)  Lisinopril-Hydrochlorothiazide 20-25 Mg  Tabs (Lisinopril-Hydrochlorothiazide) .... Take One Tablet By Mouth Daily 4)  Celexa 20 Mg  Tabs (Citalopram Hydrobromide) .... Take One Tablet By Mouth Daily 5)  Zolpidem Tartrate 5 Mg Tabs (Zolpidem Tartrate) .... Take One Tablet By Mouth At Bedtime As Needed For Trouble Sleeping. Fill One Month After Last Prescription Filled.  Allergies (verified): 1)  ! * Oxycontin  Past History:  Past Medical History: Medical Problems 1. Antral Ulcer 2. Cirrhosis 3. HTN 4. hypothyroidism - secondary to Graves Disease and radioablation in 03/2005 x 2 5. Hyperglycemia 6. Heart Mumur 7. Anemia 8.  Multivitamin 9. Irregular heart beat - on propranolol 10. Anxiety 11. Tobacco abuse 12. History of pancreatitis 13. hx of alcholol abuse  Recent Hospitalizations: 12/25 - 4 days for bleeding ulcer - had blood transfusion  Other meds:: Fish oil Folic acid  MVI Vit E 1000 - for her skin, hot flashes Garlic - for her cholesterol  B 12 and B 6 Evidently takes other supplements as well.  Was on a blood thinner - Coumadin in the past  Recent Colonoscopy - neg   Was seeing Dr. Quintella Reichert, [facility] but felt like she would rather come here.   GI physician - Dr. Arlyce Dice  Nystagmus present since childhood.  Review of Systems       See HPI  Physical Exam  General:  Well-developed,well-nourished,in no acute distress; alert,appropriate and cooperative throughout examination Head:  Normocephalic and atraumatic without obvious abnormalities.  Eyes:  No corneal or conjunctival inflammation noted. EOMI. Perrla. Nystagmus present that pt endorses has been present since childhood. Ears:  External ear exam shows no significant lesions or deformities.  Nose:  External nasal examination shows no  deformity or inflammation.  Mouth:  Oral mucosa and oropharynx without lesions or exudates.   Neck:  No deformities, masses, or tenderness noted. Chest Wall:  No deformities, masses, or tenderness noted. Breasts:  No mass, nodules, thickening, tenderness, bulging, retraction, inflamation, nipple discharge or skin changes noted.   Lungs:  Normal respiratory effort, chest expands symmetrically. Lungs are clear to auscultation, no crackles or wheezes. Heart:  Normal rate and regular rhythm. S1 and S2 normal without gallop, murmur, click, rub or other extra sounds. Abdomen:  Bowel sounds positive,abdomen soft and non-tender without masses, organomegaly or hernias noted. Genitalia:  Normal introitus for age, no external lesions, no vaginal discharge, mucosa pink and moist, no vaginal or cervical lesions,  no vaginal atrophy, no friaility or hemorrhage, normal uterus size and position, no adnexal masses or tenderness Msk:  No deformity or scoliosis noted of thoracic or lumbar spine.   Pulses:  R and L carotid,radial,femoral,dorsalis pedis and posterior tibial pulses are full and equal bilaterally Extremities:  No clubbing, cyanosis, edema, or deformity noted with normal full range of motion of all joints.   Neurologic:  No cranial nerve deficits noted. Station and gait are normal. Plantar reflexes are down-going bilaterally. DTRs are symmetrical throughout. Sensory, motor and coordinative functions appear intact. Skin:  4 skin tags on R side of back.  All <1cm. Additional Exam:  Skin tag removal x4. Consent obtained Time out conducted.   5cc total 2% lidocaine with epi infiltrated under skin tags after cleaning with alcohol swab. Tags grasped with forceps and base sliced off with #11 blade. Silver nitrate used for chemical cautery. Bandaids placed. No blood loss. Pt stable.    Impression & Recommendations:  Problem # 1:  ACROCHORDON (ICD-701.9) Assessment New Removed #4.  Orders: Removal of Skin Tags (11200) [facility]- Est  Level 4 (99214)  Problem # 2:  SCREENING FOR MALIGNANT NEOPLASM OF THE CERVIX (ICD-V76.2) Assessment: Unchanged PAp done.  Orders: Pap Smear-[facility] (19147-82956) [facility]- Est  Level 4 (21308)  Problem # 3:  HYPERTENSION, BENIGN ESSENTIAL (ICD-401.1) Assessment: Improved BP well controlled on manual recheck.  No changes. BMET today.  Her updated medication list for this problem includes:    Propranolol Hcl 20 Mg Tabs (Propranolol hcl) .Marland Kitchen... Take one tablet by mouth four times a day    Lisinopril-hydrochlorothiazide 20-25 Mg Tabs (Lisinopril-hydrochlorothiazide) .Marland Kitchen... Take one tablet by mouth daily  Orders: [facility]- Est  Level 4 (65784) Basic Met-[facility] (69629-52841)  Problem # 4:  HYPOTHYROIDISM, POST-RADIOACTIVE IODINE (ICD-244.2) Recheck TSH today.  Her updated  medication list for this problem includes:    Synthroid 125 Mcg Tabs (Levothyroxine sodium) .Marland Kitchen... Take one tablet daily.  Orders: [facility]- Est  Level 4 (99214) TSH-[facility] (32440-10272)  Problem # 5:  UNSPECIFIED BREAST SCREENING (ICD-V76.10) Assessment: New Mammo today.  Problem # 6:  Preventive Health Care (ICD-V70.0) Assessment: New Physical Exam normal. See #1  Complete Medication List: 1)  Synthroid 125 Mcg Tabs (Levothyroxine sodium) .... Take one tablet daily. 2)  Propranolol Hcl 20 Mg Tabs (Propranolol hcl) .... Take one tablet by mouth four times a day 3)  Lisinopril-hydrochlorothiazide 20-25 Mg Tabs (Lisinopril-hydrochlorothiazide) .... Take one tablet by mouth daily 4)  Celexa 20 Mg Tabs (Citalopram hydrobromide) .... Take one tablet by mouth daily 5)  Zolpidem Tartrate 5 Mg Tabs (Zolpidem tartrate) .... Take one tablet by mouth at bedtime as needed for trouble sleeping. fill one month after last prescription filled.  Last Colonoscopy:  normal (06/22/2006 4:10:19 PM) Colonoscopy Next Due:  10 yr Last PAP:   Specimen Adequacy: Satisfactory for evaluation.   Interpretation/Result:Negative for intraepithelial Lesion or Malignancy.    (01/14/2008 8:45:54 AM) PAP Next Due:  1 yr Last Mammogram:  ASSESSMENT: Negative - BI-RADS 1^MM DIGITAL SCREENING (03/14/2009 12:46:00 PM) Mammogram Next Due:  1 yr

## 2010-07-27 NOTE — Miscellaneous (Signed)
Summary: Procedure Consent  Procedure Consent   Imported By: Clydell Hakim 03/16/2010 15:22:53  _____________________________________________________________________  External Attachment:    Type:   Image     Comment:   External Document

## 2010-07-27 NOTE — Miscellaneous (Signed)
   Clinical Lists Changes  Problems: Removed problem of UNSPECIFIED BREAST SCREENING (ICD-V76.10) Removed problem of SCREENING FOR MALIGNANT NEOPLASM OF THE CERVIX (ICD-V76.2) Removed problem of ADJUSTMENT DISORDER (ICD-309.9) Removed problem of GYNECOLOGICAL EXAMINATIONOUTINE (ICD-V72.31) Removed problem of History of  GRAVE'S DISEASE, HX OF (ICD-V12.2) Removed problem of ANXIETY STATE NOS (ICD-300.00) Removed problem of History of  ANEMIA NOS (ICD-285.9) Observations: Added new observation of PAST MED HX: 1. Antral Ulcer 2. Cirrhosis 3. HTN 4. hypothyroidism - secondary to Graves Disease and radioablation in 03/2005 x 2 5. Hyperglycemia 6. Heart Mumur 7. Anemia 8. Multivitamin 9. Irregular heart beat - on propranolol 10. Anxiety 11. Tobacco abuse 12. History of pancreatitis 13. hx of alcholol abuse 14. Nystagmus present since childhood  (05/01/2010 20:41)      Past History:  Past Medical History: 1. Antral Ulcer 2. Cirrhosis 3. HTN 4. hypothyroidism - secondary to Graves Disease and radioablation in 03/2005 x 2 5. Hyperglycemia 6. Heart Mumur 7. Anemia 8. Multivitamin 9. Irregular heart beat - on propranolol 10. Anxiety 11. Tobacco abuse 12. History of pancreatitis 13. hx of alcholol abuse 14. Nystagmus present since childhood

## 2010-07-27 NOTE — Progress Notes (Signed)
 Summary: refill   Phone Note Refill Request Call back at Home Phone [phone] Message from:  Patient  Refills Requested: Medication #1:  PROPRANOLOL  HCL 20 MG  TABS Take one tablet by mouth four times a day Initial call taken by: Karna Seminole,  May 31, 2009 2:05 PM  Follow-up for Phone Call        to pcp Follow-up by: Ginnie Mau RN,  May 31, 2009 2:41 PM  Additional Follow-up for Phone Call Additional follow up Details #1::        Refilled to [facility], thanks. Additional Follow-up by: Debby Petties MD,  May 31, 2009 7:29 PM    Prescriptions: PROPRANOLOL  HCL 20 MG  TABS (PROPRANOLOL  HCL) Take one tablet by mouth four times a day  #120 x 6   Entered and Authorized by:   Debby Petties MD   Signed by:   Debby Petties MD on 05/31/2009   Method used:   Electronically to        [facility] [phone]* (retail)       [address]       [place], [place]  [address]       Ph: [phone] or [phone]       Fax: [phone]   RxID:   [id number]

## 2010-07-27 NOTE — Assessment & Plan Note (Signed)
 Summary: MEET NEW DOC/F/U MEDS/[facility]   Vital Signs:  Patient profile:   60 year old female Height:      62.9 inches Weight:      187 pounds Temp:     98.7 degrees F oral Pulse rate:   63 / minute BP sitting:   130 / 83  (left arm)  Vitals Entered By: Arland Morel (March 22, 2009 8:47 AM) CC: Meet New Doctor-Med. check Is Patient Diabetic? No Pain Assessment Patient in pain? no        Primary Care Provider:  Debby Petties MD  CC:  Meet New Doctor-Med. check.  History of Present Illness: 42F with irregular heart beat/murmur, HTN, hypothyroid here to meet new MD.  Irregular heart beat:  Asymptomatic, feels palpitations when not taking propranolol , hx coumadin use, unclear rhythm.  No CP, SOB, syncope, presyncope.  HTN:  Takes meds as rx'ed, BP ok today.  Hypothyroid:  has TSH checked q4-6 months, has been running normal for the past 2 years, asymptomatic.  Habits & Providers  Alcohol-Tobacco-Diet     Tobacco Status: current     Cigarette Packs/Day: 1.0  Allergies: 1)  ! * Oxycontin   Past History:  Past Medical History: Last updated: 06/13/2007 Medical Problems 1. Antral Ulcer 2. Cirrhosis 3. HTN 4. hypothyroidism - secondary to Graves Disease and radioablation in 03/2005 x 2 5. Hyperglycemia 6. Heart Mumur 7. Anemia 8. Multivitamin 9. Irregular heart beat - on propranolol  10. Anxiety 11. Tobacco abuse 12. History of pancreatitis 13. hx of alcholol abuse  Recent Hospitalizations: 12/25 - 4 days for bleeding ulcer - had blood transfusion  Other meds:: Fish oil Folic acid   MVI Vit E 1000 - for her skin, hot flashes Garlic - for her cholesterol  B 12 and B 6 Evidently takes other supplements as well.  Was on a blood thinner - Coumadin in the past  Recent Colonoscopy - neg   Was seeing Dr. Karna, [facility] but felt like she would rather come here.   GI physician - Dr. Debrah  Past Surgical History: Last updated:  05/05/2008 Tubal ligation Cataract surgery -bilateral Breast biopsy - 1980s - benign 2008 Sebaceous cyst removed from back  Family History: Last updated: 05/05/2008 Mom - Thyroid  issues, irreg heart beat, HTN, nerves, kidney stones, blood clots,  bypass surgery  late 10s, breast cancer age 7s, died age 48 Dad - Lung cancer - smoker, died age 23 yo Siblings - Brother - died 4 yo from cirrhosis of liver,  Brother - MI 3 weeks ago, 4 bypass surgery, cholesterol, HTN, polyp in colon  Social History: Last updated: 05/05/2008 No alcohol now but was a past drinker off and on for 30 years. Was having trouble with her children at the time. Guardians for 2 grandsons - age 55 and 65 - have PTSD and ADHD per patient. Lives with husband as well with severe COPD. Smoking around 1 ppd - sometimes a little less sometimes a little more. 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  Has stressfull living situation but daughter and her boyfriend are now moving out which is good. Said her husband is doing better with his nerve pill.  Daily Caffeine Use-3 cups daily Illicit Drug Use - no Patient does not get regular exercise.   Social History: Packs/Day:  1.0  Review of Systems       See HPI  Physical Exam  General:  Well-developed,well-nourished,in no acute  distress; alert,appropriate and cooperative throughout examination Lungs:  Normal respiratory effort, chest expands symmetrically. Lungs are clear to auscultation, no crackles or wheezes. Heart:  2/6 systolic murmur heard best at mitral area/apex.  No radiation. Additional Exam:  EKG: Normal, sinus brady.   Impression & Recommendations:  Problem # 1:  HYPERTENSION, BENIGN ESSENTIAL (ICD-401.1) Assessment Unchanged At goal.  Checking baseline BMET today.  Her updated medication list for this problem includes:    Propranolol  Hcl 20 Mg Tabs (Propranolol  hcl) .SABRA... Take one tablet by mouth four  times a day    Lisinopril -hydrochlorothiazide  20-25 Mg Tabs (Lisinopril -hydrochlorothiazide ) .SABRA... Take one tablet by mouth daily  Orders: Basic Met-[facility] ([id number]) [facility]- Est  Level 4 (00785)  Problem # 2:  HYPOTHYROIDISM, POST-RADIOACTIVE IODINE (ICD-244.2) Assessment: Unchanged s/p radioablation, hasnt had TSH checked in 7 months.  Due for check (q4-6 months).  No change in synthroid  dose for 2 years per pt.  Her updated medication list for this problem includes:     Synthroid  125 Mcg Tabs (Levothyroxine  sodium) .SABRA... Take one tablet daily.  Orders: TSH-[facility] ([id number]) [facility]- Est  Level 4 (00785)  Problem # 3:  ARRHYTHMIA, HX OF (ICD-V12.50) Assessment: Unchanged With murmur, unclear etiology and no documentation of rhythm in chart, will obtain 12 lead ECG.  Orders: [facility]- Est  Level 4 (99214)  Complete Medication List: 1)  Synthroid  125 Mcg Tabs (Levothyroxine  sodium) .... Take one tablet daily. 2)  Propranolol  Hcl 20 Mg Tabs (Propranolol  hcl) .... Take one tablet by mouth four times a day 3)  Lisinopril -hydrochlorothiazide  20-25 Mg Tabs (Lisinopril -hydrochlorothiazide ) .... Take one tablet by mouth daily 4)  Celexa  20 Mg Tabs (Citalopram  hydrobromide) .... Take one tablet by mouth daily 5)  Zolpidem Tartrate 5 Mg Tabs (Zolpidem tartrate) .... Take one tablet by mouth at bedtime as needed for trouble sleeping. fill one month after last prescription filled.  Patient Instructions: 1)  Great to meet you today, 2)  We will check your TSH and kidney function. 3)  I will call you if there are any abnormalities. 4)  Come back to see me in 6 months.  Be sure to call in for your refills. 5)  -Dr. ONEIDA.  Last PAP:   Specimen Adequacy: Satisfactory for evaluation.   Interpretation/Result:Negative for intraepithelial Lesion or Malignancy.    (01/14/2008 8:45:54 AM) PAP Next Due:  3 yr   Appended Document: Orders Update-EKG     Clinical Lists Changes  Orders: Added new Test  order of EKG- [facility] (EKG) - Signed

## 2010-08-03 ENCOUNTER — Other Ambulatory Visit: Payer: Self-pay | Admitting: Sports Medicine

## 2010-08-03 NOTE — Telephone Encounter (Signed)
Please review and refill

## 2010-08-23 ENCOUNTER — Encounter: Payer: Self-pay | Admitting: *Deleted

## 2010-09-06 LAB — CBC
HCT: 33.1 % — ABNORMAL LOW (ref 36.0–46.0)
MCHC: 35.6 g/dL (ref 30.0–36.0)
MCV: 86 fL (ref 78.0–100.0)
RDW: 13.1 % (ref 11.5–15.5)

## 2010-09-06 LAB — SURGICAL PCR SCREEN
MRSA, PCR: NEGATIVE
Staphylococcus aureus: NEGATIVE

## 2010-09-06 LAB — BASIC METABOLIC PANEL
BUN: 18 mg/dL (ref 6–23)
Chloride: 105 mEq/L (ref 96–112)
Creatinine, Ser: 0.75 mg/dL (ref 0.4–1.2)
GFR calc non Af Amer: >60 mL/min (ref >60)
Glucose, Bld: 93 mg/dL (ref 70–99)
Potassium: 3.9 mEq/L (ref 3.5–5.1)

## 2010-09-06 LAB — APTT: aPTT: 37 seconds (ref 24–37)

## 2010-09-12 ENCOUNTER — Other Ambulatory Visit: Payer: Self-pay | Admitting: Sports Medicine

## 2010-09-12 NOTE — Telephone Encounter (Signed)
Refill request

## 2010-11-10 NOTE — Discharge Summary (Signed)
[facility]  Patient:    [name], [name]                      MRN: [id number] Adm. Date:  [id number] Disc. Date: [id number] Attending:  Feliciana Rossetti                           Discharge Summary  DISCHARGE DIAGNOSES: 1. Acute pancreatitis. 2. Alcohol dependence. 3. Alcohol withdrawal.  HOSPITAL COURSE:  The patient was admitted to a general medical bed and was treated conservatively with gastrointestinal decompression, and alcohol withdrawal preventive measures were made.  The patient responded well to this conservative therapy, and by day of discharge, her pain was improving.  Her appetite had begun to return.  The patient continued to do well, and by July 19, the patient was felt ready for discharge.  She was able at this point to take full liquids without vomiting.  DISCHARGE MEDICATIONS:  Valium 2 mg t.i.d. p.r.n. for withdrawal prophylaxis.  FOLLOWUP:  She was referred for alcohol use counselling.  Followup was with Dr. Quintella Reichert two weeks following discharge. DD:  02/01/00 TD:  02/03/00 Job: [id number]

## 2010-11-10 NOTE — Discharge Summary (Signed)
NAME:  [name], [name] NO.:  [id number]   MEDICAL RECORD NO.:  [id number]                   PATIENT TYPE:  INP   LOCATION:  0480                                 FACILITY:  [facility]   PHYSICIAN:  Sherin Quarry, MD                   DATE OF BIRTH:  1951/01/04   DATE OF ADMISSION:  07/12/2002  DATE OF DISCHARGE:                                 DISCHARGE SUMMARY   HISTORY OF PRESENT ILLNESS:  The patient is a 60 year old lady whose problem  list includes liver failure with jaundice and ascites, hepatic  encephalopathy, cirrhosis of the liver, chronic alcohol abuse, 60-pack-year  smoking history, history of pancreatitis secondary to alcohol abuse,  hypokalemia.  The patient indicated at the time of admission which was on  January 18 that she had discontinued alcohol consumption on January 2.  She  states that she had not experienced any delirium tremens, tremors, or  shakes.  For 10 days prior to admission her husband had noted increased  jaundice, increased abdominal distention, generalized lethargy, and  decreased short-term memory.  She has developed spider hemangiomas.  Subsequently, when the patient was interviewed separate from her husband she  indicated that she had not, in fact, discontinued alcohol use and in fact  had been drinking about six to eight large vodka drinks each day.  On  presentation to the emergency room the patient was noted to be visibly  deeply jaundiced.  Her AST was 126, alkaline phosphatase 390, total  bilirubin 13.4.  Ammonia level was 179.  Family members reported that she  had been experiencing decreased short-term memory.   PHYSICAL EXAMINATION:  GENERAL:  The patient was an alert, but lethargic  carrot yellow appearing woman who was in no acute distress.  HEENT:  Remarkable for marked scleral icterus.  CHEST:  Remarkable for decreased breath sounds at the bases.  BACK:  No CVA or point tenderness.  CARDIOVASCULAR:  Normal  S1 and S2 without rubs, murmurs, or gallops.  ABDOMEN:  Notable for numerous spider telangiectasias with what appeared to  be marked ascites.  NEUROLOGIC:  Normal.  EXTREMITIES:  Normal.   HOSPITAL COURSE:  In light of the patient's evidence of probable alcoholic  hepatitis as well as probable hepatic encephalopathy, she was given thiamine  100 mg IV daily.  Aldactone was started 25 mg b.i.d.  Lactulose was begun 30  mL b.i.d. with instructions to adjust the dose to achieve two stools per  day.  The absolutely essential nature of the patient's need to discontinue  alcohol consumption was repeatedly emphasized.  X-ray studies obtained  included abdominal ultrasound which showed evidence of cirrhosis with a  moderate amount of ascites, overlying bowel gas obscured the pancreas.  Initially, the plan was to do a paracentesis for diagnostic purposes but  this did not prove to be readily done even  under ultrasonic guidance.  On  January 20 the patient showed evidence of acute gastric bleed.  GI consult  was obtained with Lina Sar, M.D. [facility] and colleagues.  It was recommended  that the patient be transfused and maintain a hemoglobin of 9.  Upper  endoscopy was scheduled which showed a large gastric body ulcer on the  lesser curve with densely adherent clots.  Epinephrine injection was carried  out along with a clipping procedure which the patient tolerated well.  Subsequently, there was no recurrence of bleeding.  It should be noted that  the patient's most recent INR prior to discharge was 1.4.  Subsequently, the  patient's course was one of gradual recuperation from the episode of GI  bleeding.  Her mental status became somewhat more alert.  By January 27 it  was felt reasonable to discharge the patient.   DISCHARGE DIAGNOSES:  1. Upper gastrointestinal bleed secondary to large gastric body ulcer.  2. Acute and chronic alcoholic hepatitis with jaundice, ascites, and hepatic      encephalopathy.  3. Cirrhosis of the liver.  4. History of alcohol abuse.  5. A 60-pack-year smoking history.  6. History of pancreatitis attributed to alcohol abuse.  7. Hypokalemia.   DISCHARGE MEDICATIONS:  1. Potassium chloride 20 mEq daily.  2. Multiple vitamin one daily.  3. Lactulose 30 mL t.i.d.  4. Biaxin 500 mg b.i.d.  5. Amoxicillin 1 g b.i.d.  6. Protonix 40 mg b.i.d.  This regimen for treatment of H. pylori should be     continued for seven more days.  7. In addition, the patient should receive Aldactone 100 mg daily.  8. Protonix 40 mg daily.  9. Lasix 20 mg daily.   FOLLOW UP:  A follow-up appointment will be scheduled with Iva Boop,  M.D. [facility] in about 10-14 days.  The patient was also encouraged to follow up  with Tinnie Gens C. Quintella Reichert, M.D.   CONDITION ON DISCHARGE:  Fair.  I believe the patient has been very  thoroughly instructed in the life threatening nature of her liver problem  and in the absolute essential need to discontinue the use of alcohol.                                               Sherin Quarry, MD    SY/MEDQ  D:  07/21/2002  T:  07/21/2002  Job:  [id number]   cc:   Lina Sar, M.D. [facility] C. Quintella Reichert, M.D.  [place] [address] [place]  [place] [address]  Fax: [phone]

## 2010-11-10 NOTE — Assessment & Plan Note (Signed)
[facility]                         GASTROENTEROLOGY OFFICE NOTE   NAME:[name], [name]                    MRN:          [id number]  DATE:08/05/2006                            DOB:          06-19-1951    PROBLEM:  Bleeding gastric ulcer.   [name] has returned for a scheduled GI followup.  She was  hospitalized in December 2007 for an acute upper GI bleed.  A bleeding  gastric ulcer was seen.  She was taking Naprosyn at the time.  She has a  history of cirrhosis.  Since discharge, she has had no further GI  problems.  She is off NSAIDs and is taking Protonix.   OTHER MEDICATIONS:  Synthroid, citalopram, propranolol, and lisinopril.   PHYSICAL EXAMINATION:  VITAL SIGNS:  Pulse 60, blood pressure 130,72,  weight 199.   IMPRESSION:  Bleeding gastric ulcer - most likely secondary to  nonsteroidal antiinflammatory drugs.   RECOMMENDATIONS:  Follow-up upper endoscopy.     Barbette Hair. Arlyce Dice, MD,FACG  Electronically Signed    RDK/MedQ  DD: 08/05/2006  DT: 08/05/2006  Job #: [id number]   cc:   Titus Dubin. Alwyn Ren, MD,FACP,FCCP

## 2010-11-10 NOTE — H&P (Signed)
[facility]  Patient:    [name], [name]                      MRN: [id number] Adm. Date:  [id number] Attending:  Feliciana Rossetti                         History and Physical  CHIEF COMPLAINT:  Midline back pain that is severe and nausea and vomiting.  HISTORY OF PRESENT ILLNESS:  [age]-year-old white female who has felt poorly x 1 week with decreased appetite and mild nausea.  This day of admission, she began having intractable nausea and vomiting and indeed vomited small amounts of red blood.  She denies fevers, chills, diarrhea, light head, headache.  She denies palpitations or skin rash or skin breakdown or focal numbness, paresthesias or weakness.  SOCIAL HISTORY:  Patient drinks regularly.  Patient smokes.  No illicits. Patient lives with husband and two healthy children.  MEDICINES:  None.  ALLERGIES:  None.  PAST MEDICAL HISTORY:  Alcoholism.  Cirrhosis.  PAST SURGICAL HISTORY:  Bilateral tubal ligation.  Eye surgery for dysconjugate gaze.  FAMILY HISTORY:  Heart disease.  No alcoholism.  Notable for anorexia nervosa.  PHYSICAL EXAMINATION:  GENERAL:  Nontoxic, in moderate distress.  VITAL SIGNS:  Blood pressure is 120/70.  Temperature is 98.  Respiratory rate is 20 and nonlabored.  HEENT:  Pupils are equal, round and reactive to light.  Positive nystagmus. Funduscopic exam reveals no exudate or hemorrhages, normal disk edges. Oropharynx is normal.  Mucous membranes are tacky.  HEART:  Tachycardic to 106.  LUNGS:  CTA.  Good symmetric air move.  ABDOMEN:  Soft.  Liver down four inches.  No splenomegaly.  No rebound.  No guard.  Normal bowel sounds.  EXTREMITIES:  No clubbing, cyanosis, or edema.  Positive spider telangiectasias.  No palmar erythema.  CRANIAL NERVES:  II-XII and muscle strength within normal limits.  RECTAL:  Deferred.  PELVIC:  Deferred.  DATA:  Blood notable for alcohol and elevated lipase and  amylase.  ASSESSMENT AND PLAN: 1. Pancreatitis:  Bowel rest and analgesia as necessary. 2. Alcohol withdrawal:  Valium. DD:  01/09/00 TD:  01/09/00 Job: [id number]

## 2010-11-10 NOTE — Discharge Summary (Signed)
NAME:  [name], [name] NO.:  [id number]   MEDICAL RECORD NO.:  [id number]          PATIENT TYPE:  INP   LOCATION:  4739                         FACILITY:  [facility]   PHYSICIAN:  Norton Blizzard, M.D.    DATE OF BIRTH:  05-03-1951   DATE OF ADMISSION:  06/18/2006  DATE OF DISCHARGE:  06/22/2006                               DISCHARGE SUMMARY   ATTENDING:  Pearlean Brownie, M.D.   DISCHARGE DIAGNOSES:  1. Antral ulcer.  2. Hypotension secondary to No. 1.  3. Leukocytosis.  4. Anemia.  5. Cirrhosis.  6. History of hypertension.  7. History of hypothyroidism.  8. Hyperglycemia.  9. Heart murmur.   CONSULTS:  GI.   PROCEDURES:  1. EGD x2 which showed a large antral ulcer and then the repeat showed      no evidence of recent hemorrhages and there were also no varices.  2. Chest x-ray showed nothing acute.  3. Colonoscopy, poor colonic prep, but no evidence of colonic      hemorrhage.   HOSPITAL COURSE:  1. Hypotension.  Blood pressure was 74/40 on admission due to the GI      bleed.  Her blood pressure was much improved after IV fluid      resuscitation which was done with 2 liters of normal saline bolus      and transfusion of a total of 5 units of packed red blood cells.      The EGD showed a large antral ulcer, but there was no evidence of      active bleeding.  There was also no evidence of varices.  Repeat      EGD was done because her hemoglobin was dropping, but also showed      at that time no evidence of recent hemorrhage.  We initially did      not transfuse her above a hemoglobin of 10 due to the risk of      increasing pleural pressures and worsening bleeding if she had      varices, but after the initial EGD we transfused as needed.  She      had been on 250 mL per hour of normal saline after the initial      bolus, but this was then weaned down, and she was saline locked and      IV fluids were discontinued prior to discharge.  Her systolic  blood      pressure was maintained above 90.  Urinalysis was negative for      infection.  Urine cultures had no growth.  Blood cultures had no      growth and were final.  Chest x-ray was negative for infiltrate.      With these findings we do not feel there is an infectious etiology      of her hypotension.  2. GI bleed.  Her hemoglobin was decreased to 6.8 on admission, up to      9.1 after 3 units of packed red blood cells, then up to 9.6 on      discharge after a total of 5 units  of packed red blood cells.      There was a large antral ulcer found on EGD, but no varices.      Stools were heme positive.  GI was consulted and appreciated their      management of the patient.  She was treated Protonix 40 mg b.i.d.      which was to be continued on discharge for 4 weeks and then to be      done daily.  Octreotide was started on admission, but then      discontinued since the patient does not have varices.  H. pylori is      negative.  She had been taking Naprosyn daily and aspirin daily so      medications were likely a cause.  We discontinued these medications      and informed her not to take them and not to drink alcohol at home.      She was also given ceftriaxone 1 gram x1 per the GI recommendations      on admission to cover for sepsis which could have occurred if she      had a varices that needed an intervention.  Ceftriaxone was not      continued after this one time dose.  She had a colonoscopy which      did not show evidence of bleeds either.  She will need to repeat      EGD by Dr. Arlyce Dice in a month time.  3. Leukocytosis resolved and likely due to acute illness and      demargination.  Urinalysis negative, chest x-ray negative.  Blood      and urine cultures had no growth.  4. Anemia secondary to No. 2, please see above.  Retic count was      elevated consistent with blood loss.  The patient was started on      ferrous sulfate 325 mg and discharged on this b.i.d.  5.  Cirrhosis secondary to alcohol.  Alcohol level is less than 5.  The      patient stated she stopped using a few years ago.  Ammonia was      within normal limits.  There was no Ativan protocol due to patient      not currently using alcohol.  Urine drug screen was negative for      concurrent drug use.  AST and ALT were normal.  Banana bag was      given.  She was changed to p.o. multivitamin.  Reassessing for      hepatocellular carcinoma was AFP which was normal and GI had      recommended CT with IV contrast.  This can be followed up as an      outpatient if GI decides this is necessary.  6. History of hypertension.  We held her medications which included      Lisinopril and propranolol. These were not started on discharge.  7. History of hypothyroidism.  Continue patient on her home dose of      Synthroid 137 mcg daily.  Her TSH was low, but her free T4 was      normal.  8. Hyperglycemia.  Hemoglobin A1c was 5.0.  CBG was 186 on admission.      I had checked CBGs which were normal.  9. Heart murmur.  We held her propranolol secondary to hypotension.   DISCHARGE MEDICATIONS AND INSTRUCTIONS:  1. Protonix 40 mg b.i.d. x4 weeks then daily.  2.  FeSO4 325 mg b.i.d. with meals.  3. Synthroid 137 mcg daily.  4. Multivitamin daily.   Patient was instructed to not take aspirin or any ibuprofen products or  Goody powders.  She is also instructed not to drink alcohol which she  has not for a few years.   DISCHARGE CONDITION:  Stable.   ISSUES FOR FOLLOWUP:  1. Restart hypertension medication and heart murmur medication.  This      includes Lisinopril and propranolol.  2. Check CBC and follow her hemoglobin.  3. Check fasting glucose after patient has returned from      hospitalization, but with her A1c it does not appear that      she has diabetes.  4. Consider restarting aspirin if clinically necessary, when GI     recommends this can be done.  5. Repeat EGD with Dr. Arlyce Dice in about  one month.           ______________________________  Norton Blizzard, M.D.     SH/MEDQ  D:  08/14/2006  T:  08/15/2006  Job:  [id number]   cc:   Lacretia Leigh. Quintella Reichert, M.D.  Barbette Hair. Arlyce Dice, MD,FACG

## 2010-11-10 NOTE — H&P (Signed)
NAME:  Taylor, Murray NO.:  [id number]   MEDICAL RECORD NO.:  [id number]          PATIENT TYPE:  INP   LOCATION:  3304                         FACILITY:  [facility]   PHYSICIAN:  Norton Blizzard, M.D.    DATE OF BIRTH:  [birth year]   DATE OF ADMISSION:  06/18/2006  DATE OF DISCHARGE:                              HISTORY & PHYSICAL   ADMIT ATTENDING:  Deirdre Priest, M.D.   PRIMARY CARE PHYSICIAN:  Dr. Quintella Reichert.   CHIEF COMPLAINT:  Melena, fatigue.   HISTORY OF PRESENT ILLNESS:  This is a 60 year old female with a history  of alcoholic cirrhosis, gastric ulcer in 2004 requiring epinephrine  injections and endoclipping who presented to the emergency department  with weakness, diarrhea x3 with dark stools since this morning.  The  patient is generally confused at baseline secondary to a history of  alcoholic encephalopathy, so history is complicated by this.  The  patient all of her symptoms began only today.  She has had chills, felt  cold, been sweating, and vomited coffee-ground emesis x1 in the  ambulance en route to the hospital.  She has also been more pale and had  difficulty walking because her legs felt weak.  She has felt more  short of breath today as well.  Her symptoms are not related to eating.  Her last meal was yesterday at dinner.  She had something similar to  this presentation a few years ago with her gastric ulcer she states.   PAST MEDICAL HISTORY:  1. Alcohol abuse.  2. Alcoholic cirrhosis.  3. Tobacco abuse.  4. History of gastric ulcer.  5. History of pancreatitis.  6. Hypertension.  7. Hypothyroidism.  8. Irregular rhythm.   ALLERGIES:  Hydrocodone which causes a stomach ache.   MEDICATIONS:  1. Synthroid.  2. Lisinopril.  3. Propranolol, all three of these the patient is unsure of her      dosages.  4. Aspirin 81 mg daily.   PAST SURGICAL HISTORY:  1. Tubal ligation.  2. Cataract surgery.  3. A breast biopsy.   SOCIAL HISTORY:  1.  Alcohol abuse x30 to 35 years though she did quit about four years.  2. Tobacco greater than a 60-pack-year history, and she is a current      smoker.  The patient denies drug use.  She lives with her husband.   FAMILY HISTORY:  Noncontributory.   REVIEW OF SYSTEMS:  The patient states that she has not had a fever.  Otherwise, review of systems is per HPI.   PHYSICAL EXAMINATION:  VITAL SIGNS:  Temperature 97.0, pulse 78, blood  pressure 74/40, respirations 20, pulse oximetry 100% on room air.  GENERAL:  The patient is fatigued and has pallor.  HEENT:  Head is atraumatic.  Extraocular movements are intact.  She does  have horizontal nystagmus at rest.  Pupils equal, round, and reactive to  light.  Tympanic membranes clear.  The pharynx is normal except for a  black tongue.  NECK:  No lymphadenopathy.  CARDIOVASCULAR:  There is a blowing 4/6 holosystolic murmur that  radiates to the carotid  and is heard all over the chest.  There are no  rubs or gallops.  The patient states that she has had a murmur.  RESPIRATIONS:  Clear to auscultation bilaterally, no increased work of  breathing.  ABDOMEN:  Soft, nontender, obese, no hepatosplenomegaly.  Bowel sounds  are positive.  There is no ascites or fluid wave.  EXTREMITIES:  DP 2+.  She has an abrasion on her right dorsal foot which  she states she dropped something on that extremity.  NEUROLOGIC:  She is alert and oriented x3.  Had difficulty answering  some of our questions appropriately.  She is moving all extremities, and  there is no decreased sensation in all extremities to light touch.  Cranial Nerves II-XII are grossly intact except for the nystagmus.   LABORATORY DATA AND STUDIES:  White blood cell count 18.1 with 81%  neutrophils, hemoglobin 6.8, hematocrit 19.4, platelets 190.  Sodium  137, potassium 4.9, chloride 106, bicarbonate 25, BUN 61, creatinine  1.1, glucose 186, calcium 9.6, alkaline phosphatase 71, bilirubin 1.0,   protein 5.2, albumin 2.8, AST 19, and ALT 12, INR 1.3, PT 16.8, PTT 34.  Stools is heme-positive.  Her EKG showed peak T waves in V2 through V4  though her potassium is normal.   ASSESSMENT AND PLAN:  This 60 year old female with:  1. Hypotension secondary to gastrointestinal bleed.  Will stable with      two large bore IVs and bolus normal saline and then transfuse      packed red blood cells of two units.  She has no history of      congestive heart failure per the patient and her husband.  Her      lungs are clear currently, and we will monitor her and give 20 mg      of Lasix IV in between the units of blood.  Take blood cultures,      urinalysis, chest x-ray.  Gastrointestinal was consulted.  See      below for their recommendations.  Blood pressure was 74/40 on      admission, up to 80 systolic with IV fluids.  She is orthostatic      based on her history.  2. Gastrointestinal bleed.  Her hemoglobin is decreased at 6.8.      Stools are heme-positive, and the patient has coffee-ground emesis      x1, and a history of gastric ulcer.  They are presuming a      gastrointestinal source based on the coffee-ground emesis and her      history of alcohol abuse.  [facility] was consulted      and will see the patient.  Will transfuse two units of packed red      blood cells with Lasix in between units.  Start Protonix 40 mg IV      b.i.d., and Octreotide which we will load with 50 mcg and then      start 50 mcg per hour until we discern whether this is due to an      ulcer or esophageal varices.  She is now n.p.o. We are bolusing IV      fluids of 2 liters and then will start her on 250 mL per hour.      Will follow up with post transfusions CBC.  She will like prefer an      EGD tomorrow.  The  gastric ulcer that she had in 2004 was injected  with epinephrine and then endoclipped.  Check an H. pylori.  She     has not been on any NSAIDs, and we will keep her off these here  and      actually mark her as allergic to these until this is resolved.  She      has a history of using aspirin which we will discontinue and write      as an allergy as well.  3. Leukocytosis likely secondary to acute illness and demargination.      Will check a cast urinalysis with a urine culture, PA and lateral      chest x-rays.  She had blood cultures drawn.  We have decided not      to start any antibiotics yet due to the high likelihood this is      secondary to stress.  4. Anemia secondary to number 2 and please see above.  5. History of alcohol abuse/cirrhosis.  Check an ammonia level      secondary to her history of encephalopathy and the patient's      confusion during examination questions.  Will consider lactulose      per rectum if this is increased.  Her iron was slightly elevated at      1.3.  Will check an alcohol level, and the patient states that she      has been off alcohol for four years.  We will not started Ativan      protocol yet as she does state that she has not had alcohol in four      years, but we will monitor her.  Start Thiamine, multivitamin, and      folate by IV fluids.  Will check a UDS for concurrent drug use.      Her AST and ALT were noted to be normal.  6. History of hypertension.  The patient is hypotensive now.  We will      hold her lisinopril and Propranolol.  7. History of hypothyroidism.  Will check a TSH.  She is on an unknown      dose of her Synthroid, and her husband is to bring her medication      list and her dosages.  8. Hyperglycemia.  Her CBG is 186.  We will monitor her with CBGs      twice daily.  With her history of alcohol and pancreatitis, she may      have decreased endocrine function.  9. Fluid, electrolyte, nutrition/gastroenterology.  The patient is      n.p.o.  We will monitor her electrolytes.  Her IV fluids will run      at 250 mL per hour.  10.Prophylaxis.  Protonix and SCDs.            ______________________________  Norton Blizzard, M.D.     SH/MEDQ  D:  06/18/2006  T:  06/19/2006  Job:  [id number]

## 2010-11-10 NOTE — Consult Note (Signed)
NAME:  COUSINSJenessa, Gillingham NO.:  [id number]   MEDICAL RECORD NO.:  [id number]          PATIENT TYPE:  INP   LOCATION:  3304                         FACILITY:  [facility]   PHYSICIAN:  Jordan Hawks. Elnoria Howard, MD    DATE OF BIRTH:  23-Jan-1951   DATE OF CONSULTATION:  06/18/2006  DATE OF DISCHARGE:                                 CONSULTATION   REASON FOR CONSULTATION:  Anemia and history of cirrhosis as well as  gastric ulcers.   HISTORY OF PRESENT ILLNESS:  This is a 60 year old female with a past  medical history of cirrhosis secondary to alcohol and gastric ulcer, who  is admitted to the hospital with complaints of weakness and melena.  The  patient states that 1 day prior to admission she started to feel weak,  and this was accompanied by melena. Prior to this time, she denies  seeing any black tarry stools or bright red blood. Additionally, she was  in her same state of health since 2004.  She does not drink any more  alcohol because of her previous admission. She does report the using  Naprosyn up to 1/2 bottle 1-1/2 weeks ago when she suffered a minor  trauma to her foot 2 weeks ago. The Naprosyn did cause her to have some  abdominal office, and she discontinued using the medication.  During her  transportation to the hospital, the patient vomited once, and it was  reported to be all coffee-ground, no evidence of hematemesis.  No prior  evidence of hematemesis before this time. The patient also denies having  any types of abdominal pain.  Additionally, she does use a baby aspirin  on a routine basis.   PAST MEDICAL AND SURGICAL HISTORY:  Is as stated above with the addition  of tubal ligation and eye surgery for disconjugate gaze.   ALLERGIES:  No known drug allergies.   MEDICATIONS AT HOME:  Aspirin 81 mg.   SOCIAL HISTORY:  Prior alcohol use.  No illicit drug use.  Positive for  tobacco use.   FAMILY HISTORY:  Noncontributory.   REVIEW OF SYSTEMS:  Is as per the  History of Present Illness.  Positive  for dizziness and weakness.  No complaints of blurry vision, tinnitus,  chest pain, shortness of breath, diarrhea, constipation, fevers, chills,  arthritis, arthralgias, neurologic complaints or any new skin rashes.   PHYSICAL EXAMINATION:  VITAL SIGNS:  Blood pressure 84/44, heart rate  78, respirations 20, temperature 97.  GENERAL:  The patient is in no acute distress, alert and oriented.  HEENT:  Normocephalic, atraumatic.  Extraocular muscles intact.  Pupils  equal, round, reactive to light.  NECK:  Supple.  No lymphadenopathy.  LUNGS: Clear to auscultation bilaterally.  CARDIOVASCULAR:  Regular rate and rhythm.  ABDOMEN: Obese, soft, nontender, nondistended.  No palpable  hepatosplenomegaly.  EXTREMITIES:  No clubbing, cyanosis or edema.  RECTAL:  Examination is positive for melena.   LABORATORY VALUES:  White blood cell count 18.1, hemoglobin 6.8, MCV  91.5, platelets at 190.  PT is 16.8, INR 1.3.  Sodium 137, potassium  4.9, chloride 106,  CO2 25, glucose 196.  BUN 61, creatinine 1.1, albumin  2.8, AST 19, ALT 12, alkaline phosphatase 71, total bilirubin is 1.0.   IMPRESSION:  1. Upper gastrointestinal bleed, questionable varices versus a gastric      ulcer secondary to nonsteroidal anti-inflammatory drugs.  2. History of cirrhosis. After evaluation the patient, it does not      appear that she has any current alcohol use.  However, she did have      cirrhosis previously diagnosed on imaging scan.  At that time in      2004, her clinical history was consistent with this type of      finding, and she was jaundiced and had mild liver dysfunction at      that time.  It appears that her liver function is normal      concurrently; however, with this history of cirrhosis, variceal      bleeding is a consideration. She did have an upper endoscopy that      was performed by Dr. Lina Sar in 2004, and there was evidence of      a large gastric  ulcer in the lesser curvature. The patient can have      a recurrence of her gastric ulcer as she does have a NSAID use      history.  However, I am unable to discern the exact source of the      upper GI bleed before performing endoscopy. Therefore, it would be      prudent to also cover her for a cirrhosis type of etiologies; i.e.,      variceal bleed.   PLAN AT THIS TIME:  1. Start octreotide 50 mcg loading dose and then 50 mcg per hour,      Protonix 40 mg IV b.i.d. Transfuse but do not transfuse greater      than a hemoglobin of 10. If she does have cirrhosis and this is a      variceal bleed, increasing the hemoglobin greater than 10 will      result in an increase in portal pressures which can result in a      worsening of her bleeding.  2. One dose of ceftriaxone.  Ceftriaxone is being provided at this      time as her white blood cell count is elevated.  This can be      secondary to stress; however, if she does have cirrhosis and this      is a variceal bleed, this is required in order to cover her for any      evidence of SBP or sepsis that can occur with these types of      bleeding episodes.  3. She will need to be checked for AFP.  4. After the endoscopy, a CT scan of the abdomen and pelvis is      warranted in order to screen for hepatocellular carcinoma in      regards to her history of cirrhosis.      Jordan Hawks Elnoria Howard, MD  Electronically Signed     PDH/MEDQ  D:  06/18/2006  T:  06/18/2006  Job:  [id number]   cc:   Hedwig Morton. Juanda Chance, MD

## 2010-12-23 ENCOUNTER — Other Ambulatory Visit: Payer: Self-pay | Admitting: Sports Medicine

## 2010-12-23 NOTE — Telephone Encounter (Signed)
Refill request

## 2010-12-24 NOTE — Telephone Encounter (Signed)
Refill request

## 2011-02-06 ENCOUNTER — Other Ambulatory Visit: Payer: Self-pay | Admitting: Sports Medicine

## 2011-02-06 NOTE — Telephone Encounter (Signed)
Refill request

## 2011-02-20 ENCOUNTER — Ambulatory Visit (INDEPENDENT_AMBULATORY_CARE_PROVIDER_SITE_OTHER): Payer: Medicare Other | Admitting: Family Medicine

## 2011-02-20 VITALS — BP 128/72 | HR 63 | Temp 97.8°F | Ht 63.0 in | Wt 184.4 lb

## 2011-02-20 DIAGNOSIS — I10 Essential (primary) hypertension: Secondary | ICD-10-CM

## 2011-02-20 NOTE — Patient Instructions (Signed)
It was nice to meet you. The office will call you when the forms are complete and ready for pick up. You may schedule a follow up appointment with me in 3 months. Thanks.

## 2011-02-28 ENCOUNTER — Telehealth: Payer: Self-pay | Admitting: Family Medicine

## 2011-02-28 NOTE — Telephone Encounter (Signed)
Is checking to see about paperwork that she gave to [employer] last week.  She needs this asap.

## 2011-02-28 NOTE — Telephone Encounter (Signed)
I have completed as much as the form as I could.  There is a section about alcohol and substance abuse that she will need to fill out on her own because I do not know the answers to them.  I have already signed the form so she just needs to pick it up from clinic and answer those questions honestly.  I will be in clinic Thursday afternoon.  She can pick it up anytime after 1:30pm.

## 2011-02-28 NOTE — Progress Notes (Signed)
  Subjective:    Patient ID: [name], female    DOB: 1951/02/24, 60 y.o.   MRN: [id number]  HPI  Patient here to meet new MD and to have form completed for [facility] [employer].  Patient has no complaints or concerns today.  She has a form that needs to be filled out by 03/02/11.  Hypertension: admits to taking medications daily.  Current BP 128/72.  Current medications: Lisinopril-HCTZ 20-25 and propanolol 20 mg QID.  Patient is satisfied with this regimen.  Denies any headache, nausea/vomit, chest pain, SOB, weakness, numbness/tingling, changes in vision.   Review of Systems  Per HPI    Objective:   Physical Exam  Constitutional: She is oriented to person, place, and time. No distress.  HENT:  Head: Normocephalic and atraumatic.  Right Ear: External ear normal.  Left Ear: External ear normal.  Mouth/Throat: Oropharynx is clear and moist.  Eyes: Conjunctivae and EOM are normal. Pupils are equal, round, and reactive to light.  Neck: Normal range of motion. Neck supple.  Cardiovascular: Regular rhythm and normal heart sounds.   No murmur heard. Pulmonary/Chest: Effort normal and breath sounds normal. She has no wheezes. She has no rales.  Abdominal: Soft. Bowel sounds are normal. She exhibits no distension. There is no tenderness.  Musculoskeletal: Normal range of motion. She exhibits no edema.  Lymphadenopathy:    She has no cervical adenopathy.  Neurological: She is alert and oriented to person, place, and time. No cranial nerve deficit.  Skin: Skin is warm. No rash noted.  Psychiatric: She has a normal mood and affect.          Assessment & Plan:

## 2011-02-28 NOTE — Telephone Encounter (Signed)
Forwarded to pcp.[name] Lynetta  

## 2011-02-28 NOTE — Assessment & Plan Note (Signed)
Will continue current regimen for now. May consider changing Propanolol to Metoprolol BID at next visit. Follow up in 3 to 4 months.

## 2011-03-05 ENCOUNTER — Other Ambulatory Visit: Payer: Self-pay | Admitting: Sports Medicine

## 2011-03-05 NOTE — Telephone Encounter (Signed)
Refill request

## 2011-04-17 ENCOUNTER — Other Ambulatory Visit: Payer: Self-pay | Admitting: Family Medicine

## 2011-04-17 DIAGNOSIS — Z1231 Encounter for screening mammogram for malignant neoplasm of breast: Secondary | ICD-10-CM

## 2011-05-08 ENCOUNTER — Ambulatory Visit
Admission: RE | Admit: 2011-05-08 | Discharge: 2011-05-08 | Disposition: A | Discharge status: Home / Self Care | Payer: Medicare Other | Source: Ambulatory Visit | Attending: Family Medicine | Admitting: Family Medicine

## 2011-05-08 DIAGNOSIS — Z1231 Encounter for screening mammogram for malignant neoplasm of breast: Secondary | ICD-10-CM

## 2011-05-11 ENCOUNTER — Other Ambulatory Visit: Payer: Self-pay | Admitting: Sports Medicine

## 2011-05-11 NOTE — Telephone Encounter (Signed)
Refill request

## 2011-05-14 ENCOUNTER — Other Ambulatory Visit: Payer: Self-pay | Admitting: Sports Medicine

## 2011-05-14 NOTE — Telephone Encounter (Signed)
Refill request

## 2011-07-09 ENCOUNTER — Ambulatory Visit: Payer: Medicare Other | Admitting: Family Medicine

## 2011-08-03 ENCOUNTER — Other Ambulatory Visit: Payer: Self-pay | Admitting: Family Medicine

## 2011-08-03 MED ORDER — CITALOPRAM HYDROBROMIDE 20 MG PO TABS: 20.0000 mg | ORAL_TABLET | Freq: Every day | ORAL | Status: DC

## 2011-08-03 NOTE — Telephone Encounter (Signed)
Forward to PCP.

## 2011-08-03 NOTE — Telephone Encounter (Signed)
Patient is calling for a refill on Citalopram sent to [facility] on [address].  Pharmacy said to call because there were no refills left.

## 2011-08-03 NOTE — Telephone Encounter (Signed)
Spoke with patient and informed her that rx was sent in to the pharmacy with 5 refills

## 2011-09-09 ENCOUNTER — Encounter (HOSPITAL_COMMUNITY): Payer: Self-pay

## 2011-09-09 ENCOUNTER — Emergency Department (HOSPITAL_COMMUNITY)
Admission: EM | Admit: 2011-09-09 | Discharge: 2011-09-09 | Disposition: A | Discharge status: Home / Self Care | Payer: Medicare Other | Attending: Emergency Medicine | Admitting: Emergency Medicine

## 2011-09-09 ENCOUNTER — Emergency Department (HOSPITAL_COMMUNITY): Payer: Medicare Other

## 2011-09-09 DIAGNOSIS — R6889 Other general symptoms and signs: Secondary | ICD-10-CM | POA: Insufficient documentation

## 2011-09-09 DIAGNOSIS — R0989 Other specified symptoms and signs involving the circulatory and respiratory systems: Secondary | ICD-10-CM | POA: Insufficient documentation

## 2011-09-09 DIAGNOSIS — F17200 Nicotine dependence, unspecified, uncomplicated: Secondary | ICD-10-CM | POA: Insufficient documentation

## 2011-09-09 DIAGNOSIS — J3489 Other specified disorders of nose and nasal sinuses: Secondary | ICD-10-CM | POA: Insufficient documentation

## 2011-09-09 DIAGNOSIS — R059 Cough, unspecified: Secondary | ICD-10-CM | POA: Insufficient documentation

## 2011-09-09 DIAGNOSIS — R6883 Chills (without fever): Secondary | ICD-10-CM | POA: Insufficient documentation

## 2011-09-09 DIAGNOSIS — E079 Disorder of thyroid, unspecified: Secondary | ICD-10-CM | POA: Insufficient documentation

## 2011-09-09 DIAGNOSIS — J40 Bronchitis, not specified as acute or chronic: Secondary | ICD-10-CM

## 2011-09-09 DIAGNOSIS — Z79899 Other long term (current) drug therapy: Secondary | ICD-10-CM | POA: Insufficient documentation

## 2011-09-09 DIAGNOSIS — I10 Essential (primary) hypertension: Secondary | ICD-10-CM | POA: Insufficient documentation

## 2011-09-09 DIAGNOSIS — R05 Cough: Secondary | ICD-10-CM | POA: Insufficient documentation

## 2011-09-09 HISTORY — DX: Essential (primary) hypertension: I10

## 2011-09-09 HISTORY — DX: Disorder of thyroid, unspecified: E07.9

## 2011-09-09 IMAGING — CR DG CHEST 2V
2 series · 2 of 2 positions shown · non-contrast
Comparison: [DATE]

CLINICAL DATA: Cough and chest congestion.

CHEST - 2 VIEW

[w chest pa]
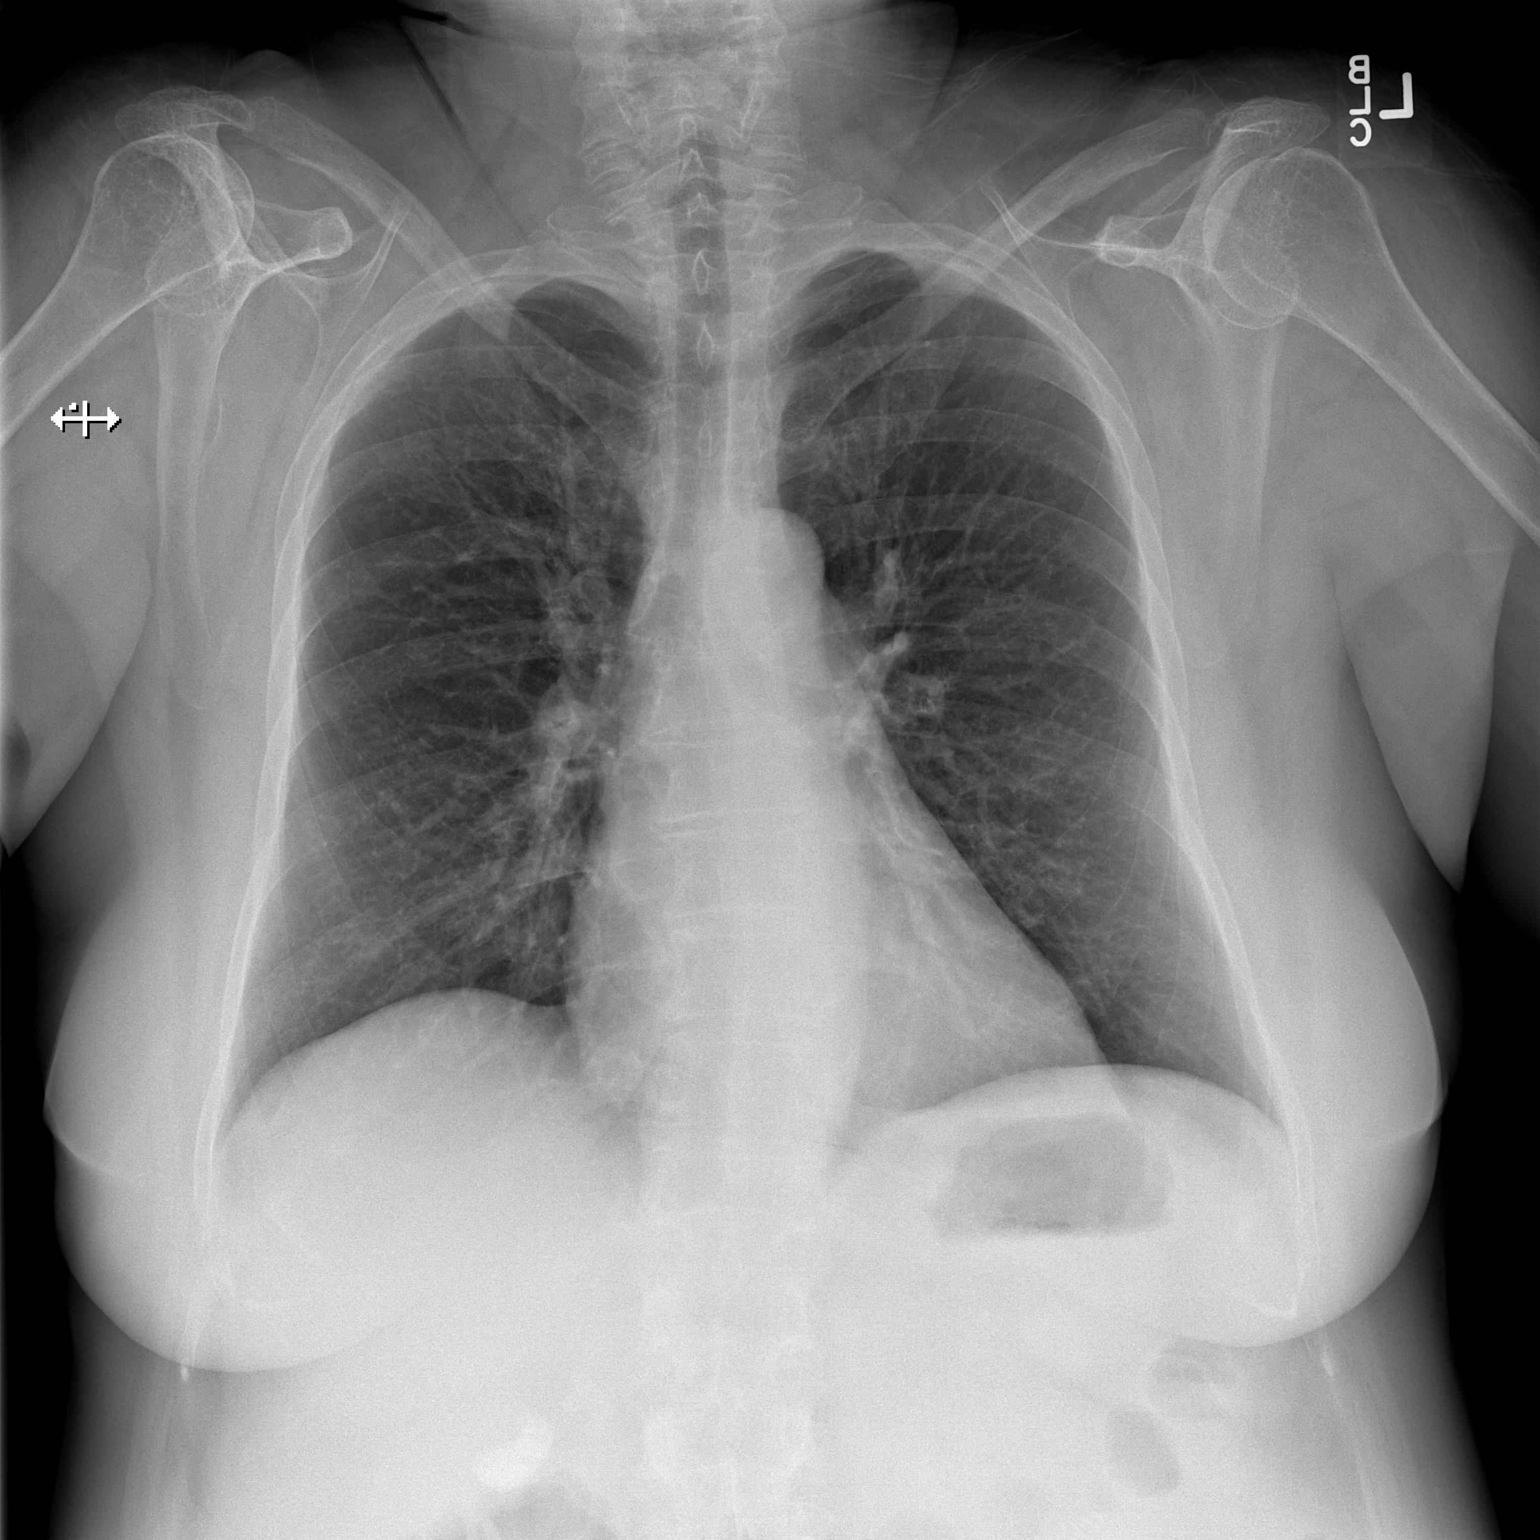

[w chest lat]
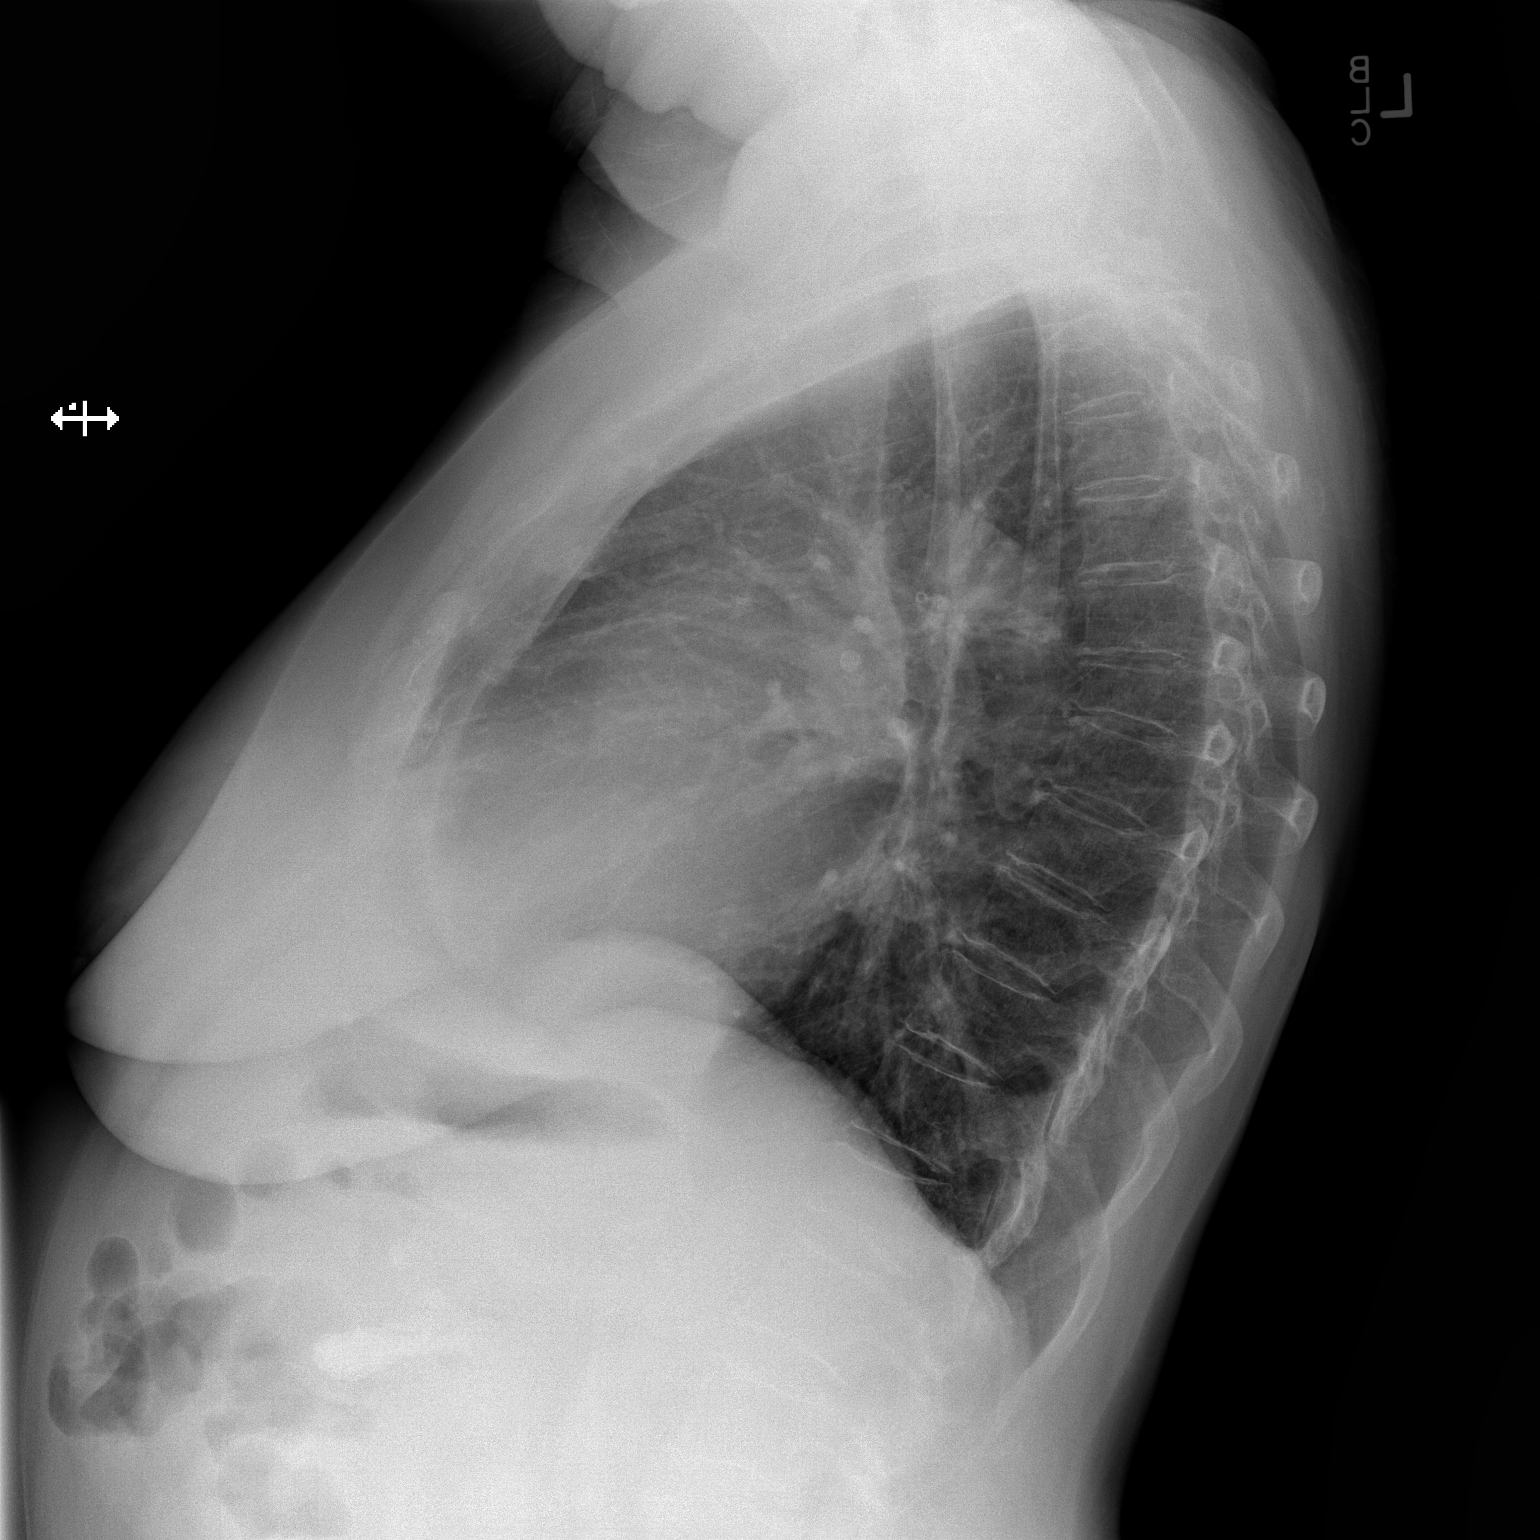

[2 of 2 positions shown; findings below may reference images not displayed]

FINDINGS: The heart size and vascularity are normal and the lungs
are clear.  Old compression deformity of the mid thoracic spine.
No acute osseous abnormalities.
IMPRESSION: No acute disease.  Calcifications in the gallbladder.

## 2011-09-09 MED ORDER — HYDROCOD POLST-CHLORPHEN POLST 10-8 MG/5ML PO LQCR
5.0000 mL | Freq: Once | ORAL | Status: AC
  Administered 2011-09-09: 5 mL via ORAL
  Filled 2011-09-09: qty 5

## 2011-09-09 MED ORDER — HYDROCODONE-ACETAMINOPHEN 5-325 MG PO TABS: 1.0000 | ORAL_TABLET | ORAL | Status: AC | PRN

## 2011-09-09 MED ORDER — HYDROCOD POLST-CHLORPHEN POLST 10-8 MG/5ML PO LQCR: 5.0000 mL | Freq: Every evening | ORAL | Status: DC | PRN

## 2011-09-09 MED ORDER — ALBUTEROL SULFATE HFA 108 (90 BASE) MCG/ACT IN AERS
2.0000 | INHALATION_SPRAY | Freq: Once | RESPIRATORY_TRACT | Status: AC
  Administered 2011-09-09: 2 via RESPIRATORY_TRACT
  Filled 2011-09-09: qty 6.7

## 2011-09-09 NOTE — ED Provider Notes (Signed)
Medical screening examination/treatment/procedure(s) were performed by non-physician practitioner and as supervising physician I was immediately available for consultation/collaboration.   Khrystina Bonnes A. Lorice Lafave, MD 09/09/11 2348 

## 2011-09-09 NOTE — ED Provider Notes (Signed)
History     CSN: [id number]  Arrival date & time 09/09/11  1858   First MD Initiated Contact with Patient 09/09/11 2033      Chief Complaint  Patient presents with  . Nasal Congestion    (Consider location/radiation/quality/duration/timing/severity/associated sxs/prior treatment) HPI History provided by pt.   Pt has had a constant cough productive of clear phlegm x 2 weeks.  Worse at night.  Associated w/ chills, sneezing, mild sinus pressure, nasal congestion and rhinorrhea.  Denies fever, CP, SOB, N/V/D.  Her grandchildren have had coughs recently.  Pt smokes cigarettes.  No h/o pulmonary dz.    Past Medical History  Diagnosis Date  . Hypertension   . Thyroid disease     History reviewed. No pertinent past surgical history.  No family history on file.  History  Substance Use Topics  . Smoking status: Current Everyday Smoker -- 1.0 packs/day  . Smokeless tobacco: Not on file  . Alcohol Use:     OB History    Grav Para Term Preterm Abortions TAB SAB Ect Mult Living                  Review of Systems  All other systems reviewed and are negative.    Allergies  Oxycodone hcl  Home Medications   Current Outpatient Rx  Name Route Sig Dispense Refill  . CITALOPRAM HYDROBROMIDE 20 MG PO TABS Oral Take 1 tablet (20 mg total) by mouth daily. 30 tablet 5  . LEVOTHYROXINE SODIUM 125 MCG PO TABS  TAKE 1 TABLET EVERY DAY 30 tablet 6  . LISINOPRIL-HYDROCHLOROTHIAZIDE 20-25 MG PO TABS  TAKE 1 TABLET BY MOUTH EVERY DAY 30 tablet 11  . PROPRANOLOL HCL 20 MG PO TABS  TAKE ONE TABLET BY MOUTH FOUR TIMES A DAY 120 tablet 5  . ZOLPIDEM TARTRATE 5 MG PO TABS Oral Take 5 mg by mouth at bedtime as needed.        BP 134/67  Pulse 54  Temp(Src) 99.1 F (37.3 C) (Oral)  Resp 18  Ht 5\' 3"  (1.6 m)  Wt 181 lb (82.101 kg)  BMI 32.06 kg/m2  SpO2 96%  Physical Exam  Nursing note and vitals reviewed. Constitutional: She is oriented to person, place, and time. She appears  well-developed and well-nourished. No distress.  HENT:  Head: Normocephalic and atraumatic. No trismus in the jaw.  Right Ear: Tympanic membrane and ear canal normal.  Left Ear: Tympanic membrane and ear canal normal.  Mouth/Throat: Uvula is midline, oropharynx is clear and moist and mucous membranes are normal. No oropharyngeal exudate, posterior oropharyngeal edema or posterior oropharyngeal erythema.       Nasal congestion.  Left and right TM nml.    Eyes:       Normal appearance  Neck: Normal range of motion. Neck supple.  Cardiovascular: Normal rate and regular rhythm.   Pulmonary/Chest: Effort normal. No respiratory distress.       Inspiratory wheezing left lung base only.  Chest sounds congested  Musculoskeletal: Normal range of motion.  Lymphadenopathy:    She has no cervical adenopathy.  Neurological: She is alert and oriented to person, place, and time.  Skin: Skin is warm and dry. No rash noted.  Psychiatric: She has a normal mood and affect. Her behavior is normal.    ED Course  Procedures (including critical care time)  Labs Reviewed - No data to display Dg Chest 2 View  09/09/2011  *RADIOLOGY REPORT*  Clinical Data: Cough and  chest congestion.  CHEST - 2 VIEW  Comparison: 03/27/2010  Findings: The heart size and vascularity are normal and the lungs are clear.  Old compression deformity of the mid thoracic spine. No acute osseous abnormalities.  IMPRESSION: No acute disease.  Calcifications in the gallbladder.  Original Report Authenticated By: Gwynn Burly, M.D.     1. Bronchitis       MDM  60yo F presents w/ productive cough x 2 days.  Afebrile, VS w/in nml range, no respiratory distress or coughing and wheezing left lung base only on exam.  Suspect viral infection based on exam and presence of other URI sx, but CXR ordered d/t duration cough and is pending.  9:10 PM   CXR neg for pneumonia.  Results discussed w/ pt.  Pt received an albuterol inhaler and a  prescription for tussionex.  She has a PCP to f/u with.  Advised her to return if she develops CP/SOB.          Otilio Miu, [place] 09/09/11 2137

## 2011-09-09 NOTE — Discharge Instructions (Signed)
Use albuterol inhaler, 2 puffs every 4 hours, as needed for cough and shortness of breath.  Take tussionex at night for cough.   Do not drive within four hours of taking this medication (may cause drowsiness or confusion).  Try to cut back on smoking as much as possible.  Bronchitis Bronchitis is the body's way of reacting to injury and/or infection (inflammation) of the bronchi. Bronchi are the air tubes that extend from the windpipe into the lungs. If the inflammation becomes severe, it may cause shortness of breath. CAUSES  Inflammation may be caused by:  A virus.   Germs (bacteria).   Dust.   Allergens.   Pollutants and many other irritants.  The cells lining the bronchial tree are covered with tiny hairs (cilia). These constantly beat upward, away from the lungs, toward the mouth. This keeps the lungs free of pollutants. When these cells become too irritated and are unable to do their job, mucus begins to develop. This causes the characteristic cough of bronchitis. The cough clears the lungs when the cilia are unable to do their job. Without either of these protective mechanisms, the mucus would settle in the lungs. Then you would develop pneumonia. Smoking is a common cause of bronchitis and can contribute to pneumonia. Stopping this habit is the single most important thing you can do to help yourself. TREATMENT   Your caregiver may prescribe an antibiotic if the cough is caused by bacteria. Also, medicines that open up your airways make it easier to breathe. Your caregiver may also recommend or prescribe an expectorant. It will loosen the mucus to be coughed up. Only take over-the-counter or prescription medicines for pain, discomfort, or fever as directed by your caregiver.   Removing whatever causes the problem (smoking, for example) is critical to preventing the problem from getting worse.   Cough suppressants may be prescribed for relief of cough symptoms.   Inhaled medicines may  be prescribed to help with symptoms now and to help prevent problems from returning.   For those with recurrent (chronic) bronchitis, there may be a need for steroid medicines.  SEEK IMMEDIATE MEDICAL CARE IF:   During treatment, you develop more pus-like mucus (purulent sputum).   You have a fever.   Your baby is older than 3 months with a rectal temperature of 102 F (38.9 C) or higher.   Your baby is 5 months old or younger with a rectal temperature of 100.4 F (38 C) or higher.   You become progressively more ill.   You have increased difficulty breathing, wheezing, or shortness of breath.  It is necessary to seek immediate medical care if you are elderly or sick from any other disease. MAKE SURE YOU:   Understand these instructions.   Will watch your condition.   Will get help right away if you are not doing well or get worse.  Document Released: 06/11/2005 Document Revised: 05/31/2011 Document Reviewed: 04/20/2008 [facility] Patient Information 2012 [place], [place].Antibiotic Nonuse  Your caregiver felt that the infection or problem was not one that would be helped with an antibiotic. Infections may be caused by viruses or bacteria. Only a caregiver can tell which one of these is the likely cause of an illness. A cold is the most common cause of infection in both adults and children. A cold is a virus. Antibiotic treatment will have no effect on a viral infection. Viruses can lead to many lost days of work caring for sick children and many missed days  of school. Children may catch as many as 10 "colds" or "flus" per year during which they can be tearful, cranky, and uncomfortable. The goal of treating a virus is aimed at keeping the ill person comfortable. Antibiotics are medications used to help the body fight bacterial infections. There are relatively few types of bacteria that cause infections but there are hundreds of viruses. While both viruses and bacteria cause infection they  are very different types of germs. A viral infection will typically go away by itself within 7 to 10 days. Bacterial infections may spread or get worse without antibiotic treatment. Examples of bacterial infections are:  Sore throats (like strep throat or tonsillitis).   Infection in the lung (pneumonia).   Ear and skin infections.  Examples of viral infections are:  Colds or flus.   Most coughs and bronchitis.   Sore throats not caused by Strep.   Runny noses.  It is often best not to take an antibiotic when a viral infection is the cause of the problem. Antibiotics can kill off the helpful bacteria that we have inside our body and allow harmful bacteria to start growing. Antibiotics can cause side effects such as allergies, nausea, and diarrhea without helping to improve the symptoms of the viral infection. Additionally, repeated uses of antibiotics can cause bacteria inside of our body to become resistant. That resistance can be passed onto harmful bacterial. The next time you have an infection it may be harder to treat if antibiotics are used when they are not needed. Not treating with antibiotics allows our own immune system to develop and take care of infections more efficiently. Also, antibiotics will work better for [place] when they are prescribed for bacterial infections. Treatments for a child that is ill may include:  Give extra fluids throughout the day to stay hydrated.   Get plenty of rest.   Only give your child over-the-counter or prescription medicines for pain, discomfort, or fever as directed by your caregiver.   The use of a cool mist humidifier may help stuffy noses.   Cold medications if suggested by your caregiver.  Your caregiver may decide to start you on an antibiotic if:  The problem you were seen for today continues for a longer length of time than expected.   You develop a secondary bacterial infection.  SEEK MEDICAL CARE IF:  Fever lasts longer than 5  days.   Symptoms continue to get worse after 5 to 7 days or become severe.   Difficulty in breathing develops.   Signs of dehydration develop (poor drinking, rare urinating, dark colored urine).   Changes in behavior or worsening tiredness (listlessness or lethargy).  Document Released: 08/20/2001 Document Revised: 05/31/2011 Document Reviewed: 02/16/2009 [facility] Patient Information 2012 [place], [place].Follow up with your primary care doctor if symptoms have not started to improve by the end of the week.   You may return to the ER if symptoms worsen or you have any other concerns.

## 2011-09-09 NOTE — ED Notes (Signed)
Patient reports intermittent chest congestion and dry cough for 2 weeks. No distress on assessment, complains of general fatique

## 2011-09-28 ENCOUNTER — Telehealth: Payer: Self-pay | Admitting: Family Medicine

## 2011-09-28 MED ORDER — PROPRANOLOL HCL 20 MG PO TABS: 20.0000 mg | ORAL_TABLET | Freq: Four times a day (QID) | ORAL | Status: DC

## 2011-09-28 NOTE — Telephone Encounter (Signed)
Received refill request. Pt will need to f/u for additional refills as her PCP has considered changing propanolol to metoprolol.

## 2011-10-30 ENCOUNTER — Other Ambulatory Visit: Payer: Self-pay | Admitting: Family Medicine

## 2011-11-21 ENCOUNTER — Encounter: Payer: Self-pay | Admitting: Family Medicine

## 2011-11-21 ENCOUNTER — Ambulatory Visit (INDEPENDENT_AMBULATORY_CARE_PROVIDER_SITE_OTHER): Payer: Medicare Other | Admitting: Family Medicine

## 2011-11-21 VITALS — BP 133/74 | HR 50 | Temp 98.4°F | Ht 63.0 in | Wt 182.3 lb

## 2011-11-21 DIAGNOSIS — G479 Sleep disorder, unspecified: Secondary | ICD-10-CM

## 2011-11-21 DIAGNOSIS — G478 Other sleep disorders: Secondary | ICD-10-CM

## 2011-11-21 DIAGNOSIS — E039 Hypothyroidism, unspecified: Secondary | ICD-10-CM

## 2011-11-21 DIAGNOSIS — R001 Bradycardia, unspecified: Secondary | ICD-10-CM

## 2011-11-21 DIAGNOSIS — I498 Other specified cardiac arrhythmias: Secondary | ICD-10-CM

## 2011-11-21 DIAGNOSIS — I10 Essential (primary) hypertension: Secondary | ICD-10-CM

## 2011-11-21 DIAGNOSIS — E018 Other iodine-deficiency related thyroid disorders and allied conditions: Secondary | ICD-10-CM

## 2011-11-21 LAB — CBC
MCV: 89.6 fL (ref 78.0–100.0)
Platelets: 158 10*3/uL (ref 150–400)
RDW: 13.6 % (ref 11.5–15.5)
WBC: 6.9 10*3/uL (ref 4.0–10.5)

## 2011-11-21 LAB — COMPREHENSIVE METABOLIC PANEL
Albumin: 4.3 g/dL (ref 3.5–5.2)
Chloride: 103 mEq/L (ref 96–112)
Creat: 0.69 mg/dL (ref 0.50–1.10)
Total Protein: 7 g/dL (ref 6.0–8.3)

## 2011-11-21 LAB — TSH: TSH: 1.4 u[IU]/mL (ref 0.350–4.500)

## 2011-11-21 MED ORDER — PROPRANOLOL HCL 20 MG PO TABS: 20.0000 mg | ORAL_TABLET | Freq: Two times a day (BID) | ORAL | Status: DC

## 2011-11-21 NOTE — Progress Notes (Signed)
  Subjective:    Patient ID: [name], female    DOB: 1950-09-18, 61 y.o.   MRN: [id number]  HPI  Hypothyroidism: Patient here to check TSH.  She has had radiation therapy for Grave's Disease in the past and now takes Synthroid 125.  She complains of fatigue, decreased energy, and difficulty sleeping at night.  She sleeps around 1 AM and wakes up twice during the night.  Does take naps during day.  Denies excessive caffeine use.  Hypertension: Currently 133/74.  She takes medications daily and watches her diet.  Denies any headache, chest pain, SOB, changes in vision, or numbness/tingling of extremities.  Low HR: patient takes Propanolol 20 TID.  She says that she takes it for a murmur that was diagnosed by former doctor in 1990s.  She cannot remember if she was told she had a fast HR, but does remember having palpitations.  Complains of fatigue and decreased energy.  Current HR is 50.   Review of Systems  Per HPI    Objective:   Physical Exam  Constitutional: No distress.  HENT:  Head: Normocephalic and atraumatic.  Mouth/Throat: Oropharynx is clear and moist.  Neck: Neck supple. No thyromegaly present.  Cardiovascular:  No murmur heard.      Sinus bradycardia  Pulmonary/Chest: Effort normal and breath sounds normal. She has no wheezes. She has no rales.  Abdominal: Soft. Bowel sounds are normal. She exhibits no distension. There is no tenderness.  Musculoskeletal: Normal range of motion. She exhibits no edema.  Skin: Skin is warm and dry.          Assessment & Plan:

## 2011-11-21 NOTE — Assessment & Plan Note (Signed)
Will check TSH, CBC, and CMET today. Discussed good sleep hygiene techniques.

## 2011-11-21 NOTE — Assessment & Plan Note (Signed)
HR 50 today.  Patient taking Propanolol 20 BID for unknown reason - per patient, she takes it for a murmur. Will request old medical records from [facility]. Will decrease Propanolol to BID.  Good for BP control too. Follow up in 6 months or sooner as needed.

## 2011-11-21 NOTE — Patient Instructions (Signed)
It was great to see you today. I will call or send letter with lab results in 3-4 days. Please avoid naps during the day and caffeine after dinner time. I will request medical records from your last doctor. Please schedule follow up appointment with me in 6 months or sooner as needed.

## 2011-11-21 NOTE — Assessment & Plan Note (Signed)
Will check TSH today and adjust medication as necessary.

## 2011-11-21 NOTE — Assessment & Plan Note (Signed)
BP well controlled on Lisinopril/HCTZ 20-25 and Propanolol TID.  Will decrease Propanolol to BID due to low HR. Recheck in 6 months. CBC and CMET today.

## 2011-11-22 ENCOUNTER — Encounter: Payer: Self-pay | Admitting: Family Medicine

## 2011-11-30 ENCOUNTER — Encounter (INDEPENDENT_AMBULATORY_CARE_PROVIDER_SITE_OTHER): Payer: Medicare Other | Admitting: Ophthalmology

## 2011-11-30 DIAGNOSIS — H353290 Exudative age-related macular degeneration, unspecified eye, stage unspecified: Secondary | ICD-10-CM

## 2011-11-30 DIAGNOSIS — H2700 Aphakia, unspecified eye: Secondary | ICD-10-CM

## 2011-11-30 DIAGNOSIS — H43819 Vitreous degeneration, unspecified eye: Secondary | ICD-10-CM

## 2011-11-30 DIAGNOSIS — H3530 Unspecified macular degeneration: Secondary | ICD-10-CM

## 2011-12-31 ENCOUNTER — Other Ambulatory Visit: Payer: Self-pay | Admitting: *Deleted

## 2011-12-31 MED ORDER — LISINOPRIL-HYDROCHLOROTHIAZIDE 20-25 MG PO TABS: 1.0000 | ORAL_TABLET | Freq: Every day | ORAL | Status: DC

## 2012-01-03 ENCOUNTER — Encounter (INDEPENDENT_AMBULATORY_CARE_PROVIDER_SITE_OTHER): Payer: Medicare Other | Admitting: Ophthalmology

## 2012-01-03 DIAGNOSIS — H43399 Other vitreous opacities, unspecified eye: Secondary | ICD-10-CM

## 2012-01-03 DIAGNOSIS — H353290 Exudative age-related macular degeneration, unspecified eye, stage unspecified: Secondary | ICD-10-CM

## 2012-01-03 DIAGNOSIS — I10 Essential (primary) hypertension: Secondary | ICD-10-CM

## 2012-01-03 DIAGNOSIS — H3530 Unspecified macular degeneration: Secondary | ICD-10-CM

## 2012-01-03 DIAGNOSIS — H35039 Hypertensive retinopathy, unspecified eye: Secondary | ICD-10-CM

## 2012-01-16 ENCOUNTER — Other Ambulatory Visit: Payer: Self-pay | Admitting: Sports Medicine

## 2012-01-25 ENCOUNTER — Other Ambulatory Visit: Payer: Self-pay | Admitting: Family Medicine

## 2012-02-01 ENCOUNTER — Encounter (INDEPENDENT_AMBULATORY_CARE_PROVIDER_SITE_OTHER): Payer: Medicare Other | Admitting: Ophthalmology

## 2012-02-05 ENCOUNTER — Encounter (INDEPENDENT_AMBULATORY_CARE_PROVIDER_SITE_OTHER): Payer: Medicare Other | Admitting: Ophthalmology

## 2012-02-05 DIAGNOSIS — H43819 Vitreous degeneration, unspecified eye: Secondary | ICD-10-CM

## 2012-02-05 DIAGNOSIS — H3530 Unspecified macular degeneration: Secondary | ICD-10-CM

## 2012-02-05 DIAGNOSIS — H353290 Exudative age-related macular degeneration, unspecified eye, stage unspecified: Secondary | ICD-10-CM

## 2012-02-05 DIAGNOSIS — H35039 Hypertensive retinopathy, unspecified eye: Secondary | ICD-10-CM

## 2012-02-05 DIAGNOSIS — I10 Essential (primary) hypertension: Secondary | ICD-10-CM

## 2012-02-27 ENCOUNTER — Other Ambulatory Visit: Payer: Self-pay | Admitting: *Deleted

## 2012-02-28 ENCOUNTER — Encounter (INDEPENDENT_AMBULATORY_CARE_PROVIDER_SITE_OTHER): Payer: Medicare Other | Admitting: Ophthalmology

## 2012-02-28 DIAGNOSIS — H43819 Vitreous degeneration, unspecified eye: Secondary | ICD-10-CM

## 2012-02-28 DIAGNOSIS — H353290 Exudative age-related macular degeneration, unspecified eye, stage unspecified: Secondary | ICD-10-CM

## 2012-02-28 DIAGNOSIS — H35039 Hypertensive retinopathy, unspecified eye: Secondary | ICD-10-CM

## 2012-02-28 DIAGNOSIS — H3530 Unspecified macular degeneration: Secondary | ICD-10-CM

## 2012-02-28 DIAGNOSIS — I10 Essential (primary) hypertension: Secondary | ICD-10-CM

## 2012-02-28 MED ORDER — LISINOPRIL-HYDROCHLOROTHIAZIDE 20-25 MG PO TABS: 1.0000 | ORAL_TABLET | Freq: Every day | ORAL | Status: DC

## 2012-03-20 ENCOUNTER — Encounter (INDEPENDENT_AMBULATORY_CARE_PROVIDER_SITE_OTHER): Payer: Medicare Other | Admitting: Ophthalmology

## 2012-04-17 ENCOUNTER — Encounter (INDEPENDENT_AMBULATORY_CARE_PROVIDER_SITE_OTHER): Payer: Medicare Other | Admitting: Ophthalmology

## 2012-04-17 DIAGNOSIS — H43819 Vitreous degeneration, unspecified eye: Secondary | ICD-10-CM

## 2012-04-17 DIAGNOSIS — H35039 Hypertensive retinopathy, unspecified eye: Secondary | ICD-10-CM

## 2012-04-17 DIAGNOSIS — H353290 Exudative age-related macular degeneration, unspecified eye, stage unspecified: Secondary | ICD-10-CM

## 2012-04-17 DIAGNOSIS — H2510 Age-related nuclear cataract, unspecified eye: Secondary | ICD-10-CM

## 2012-04-17 DIAGNOSIS — H3530 Unspecified macular degeneration: Secondary | ICD-10-CM

## 2012-04-17 DIAGNOSIS — I10 Essential (primary) hypertension: Secondary | ICD-10-CM

## 2012-05-15 ENCOUNTER — Encounter (INDEPENDENT_AMBULATORY_CARE_PROVIDER_SITE_OTHER): Payer: Medicare Other | Admitting: Ophthalmology

## 2012-05-15 DIAGNOSIS — H3530 Unspecified macular degeneration: Secondary | ICD-10-CM

## 2012-05-15 DIAGNOSIS — H35039 Hypertensive retinopathy, unspecified eye: Secondary | ICD-10-CM

## 2012-05-15 DIAGNOSIS — H43819 Vitreous degeneration, unspecified eye: Secondary | ICD-10-CM

## 2012-05-15 DIAGNOSIS — I10 Essential (primary) hypertension: Secondary | ICD-10-CM

## 2012-05-15 DIAGNOSIS — H353290 Exudative age-related macular degeneration, unspecified eye, stage unspecified: Secondary | ICD-10-CM

## 2012-05-17 ENCOUNTER — Other Ambulatory Visit: Payer: Self-pay | Admitting: Family Medicine

## 2012-05-21 ENCOUNTER — Other Ambulatory Visit: Payer: Self-pay | Admitting: Family Medicine

## 2012-05-26 ENCOUNTER — Other Ambulatory Visit: Payer: Self-pay | Admitting: Family Medicine

## 2012-05-26 DIAGNOSIS — Z1231 Encounter for screening mammogram for malignant neoplasm of breast: Secondary | ICD-10-CM

## 2012-06-12 ENCOUNTER — Encounter (INDEPENDENT_AMBULATORY_CARE_PROVIDER_SITE_OTHER): Payer: Medicare Other | Admitting: Ophthalmology

## 2012-06-12 DIAGNOSIS — I10 Essential (primary) hypertension: Secondary | ICD-10-CM

## 2012-06-12 DIAGNOSIS — H3530 Unspecified macular degeneration: Secondary | ICD-10-CM

## 2012-06-12 DIAGNOSIS — H43819 Vitreous degeneration, unspecified eye: Secondary | ICD-10-CM

## 2012-06-12 DIAGNOSIS — H35039 Hypertensive retinopathy, unspecified eye: Secondary | ICD-10-CM

## 2012-06-12 DIAGNOSIS — H353290 Exudative age-related macular degeneration, unspecified eye, stage unspecified: Secondary | ICD-10-CM

## 2012-06-30 ENCOUNTER — Ambulatory Visit: Payer: Medicare Other

## 2012-07-10 ENCOUNTER — Encounter (INDEPENDENT_AMBULATORY_CARE_PROVIDER_SITE_OTHER): Payer: Medicare Other | Admitting: Ophthalmology

## 2012-07-10 DIAGNOSIS — H35039 Hypertensive retinopathy, unspecified eye: Secondary | ICD-10-CM

## 2012-07-10 DIAGNOSIS — H3530 Unspecified macular degeneration: Secondary | ICD-10-CM

## 2012-07-10 DIAGNOSIS — H353290 Exudative age-related macular degeneration, unspecified eye, stage unspecified: Secondary | ICD-10-CM

## 2012-07-10 DIAGNOSIS — H43819 Vitreous degeneration, unspecified eye: Secondary | ICD-10-CM

## 2012-07-10 DIAGNOSIS — I10 Essential (primary) hypertension: Secondary | ICD-10-CM

## 2012-07-19 HISTORY — PX: IM NAILING TIBIA: SUR734

## 2012-08-04 ENCOUNTER — Encounter (INDEPENDENT_AMBULATORY_CARE_PROVIDER_SITE_OTHER): Payer: Medicare Other | Admitting: Ophthalmology

## 2012-08-19 ENCOUNTER — Encounter (INDEPENDENT_AMBULATORY_CARE_PROVIDER_SITE_OTHER): Payer: Medicare Other | Admitting: Ophthalmology

## 2012-08-19 DIAGNOSIS — H35039 Hypertensive retinopathy, unspecified eye: Secondary | ICD-10-CM

## 2012-08-19 DIAGNOSIS — H43819 Vitreous degeneration, unspecified eye: Secondary | ICD-10-CM

## 2012-08-19 DIAGNOSIS — H353290 Exudative age-related macular degeneration, unspecified eye, stage unspecified: Secondary | ICD-10-CM

## 2012-08-19 DIAGNOSIS — H3530 Unspecified macular degeneration: Secondary | ICD-10-CM

## 2012-08-19 DIAGNOSIS — I10 Essential (primary) hypertension: Secondary | ICD-10-CM

## 2012-09-15 ENCOUNTER — Encounter (INDEPENDENT_AMBULATORY_CARE_PROVIDER_SITE_OTHER): Payer: Medicare Other | Admitting: Ophthalmology

## 2012-09-15 DIAGNOSIS — H353290 Exudative age-related macular degeneration, unspecified eye, stage unspecified: Secondary | ICD-10-CM

## 2012-09-15 DIAGNOSIS — H35039 Hypertensive retinopathy, unspecified eye: Secondary | ICD-10-CM

## 2012-09-15 DIAGNOSIS — I10 Essential (primary) hypertension: Secondary | ICD-10-CM

## 2012-09-15 DIAGNOSIS — H43819 Vitreous degeneration, unspecified eye: Secondary | ICD-10-CM

## 2012-09-15 DIAGNOSIS — H3530 Unspecified macular degeneration: Secondary | ICD-10-CM

## 2012-10-17 ENCOUNTER — Encounter (INDEPENDENT_AMBULATORY_CARE_PROVIDER_SITE_OTHER): Payer: Medicare Other | Admitting: Ophthalmology

## 2012-10-23 ENCOUNTER — Encounter (INDEPENDENT_AMBULATORY_CARE_PROVIDER_SITE_OTHER): Payer: Medicare Other | Admitting: Ophthalmology

## 2012-10-23 DIAGNOSIS — H353290 Exudative age-related macular degeneration, unspecified eye, stage unspecified: Secondary | ICD-10-CM

## 2012-10-23 DIAGNOSIS — I10 Essential (primary) hypertension: Secondary | ICD-10-CM

## 2012-10-23 DIAGNOSIS — H43819 Vitreous degeneration, unspecified eye: Secondary | ICD-10-CM

## 2012-10-23 DIAGNOSIS — H35039 Hypertensive retinopathy, unspecified eye: Secondary | ICD-10-CM

## 2012-10-23 DIAGNOSIS — H3530 Unspecified macular degeneration: Secondary | ICD-10-CM

## 2012-10-24 ENCOUNTER — Encounter (INDEPENDENT_AMBULATORY_CARE_PROVIDER_SITE_OTHER): Payer: Medicare Other | Admitting: Ophthalmology

## 2012-11-20 ENCOUNTER — Encounter (INDEPENDENT_AMBULATORY_CARE_PROVIDER_SITE_OTHER): Payer: Medicare Other | Admitting: Ophthalmology

## 2012-11-20 DIAGNOSIS — H353290 Exudative age-related macular degeneration, unspecified eye, stage unspecified: Secondary | ICD-10-CM

## 2012-11-20 DIAGNOSIS — H3530 Unspecified macular degeneration: Secondary | ICD-10-CM

## 2012-11-20 DIAGNOSIS — H43819 Vitreous degeneration, unspecified eye: Secondary | ICD-10-CM

## 2012-11-20 DIAGNOSIS — I10 Essential (primary) hypertension: Secondary | ICD-10-CM

## 2012-11-20 DIAGNOSIS — H35039 Hypertensive retinopathy, unspecified eye: Secondary | ICD-10-CM

## 2012-11-21 ENCOUNTER — Other Ambulatory Visit: Payer: Self-pay | Admitting: Family Medicine

## 2012-12-17 ENCOUNTER — Other Ambulatory Visit: Payer: Self-pay | Admitting: Family Medicine

## 2012-12-18 ENCOUNTER — Encounter (INDEPENDENT_AMBULATORY_CARE_PROVIDER_SITE_OTHER): Payer: Medicare Other | Admitting: Ophthalmology

## 2012-12-18 DIAGNOSIS — H353290 Exudative age-related macular degeneration, unspecified eye, stage unspecified: Secondary | ICD-10-CM

## 2012-12-18 DIAGNOSIS — H43819 Vitreous degeneration, unspecified eye: Secondary | ICD-10-CM

## 2012-12-18 DIAGNOSIS — H35039 Hypertensive retinopathy, unspecified eye: Secondary | ICD-10-CM

## 2012-12-18 DIAGNOSIS — H3530 Unspecified macular degeneration: Secondary | ICD-10-CM

## 2012-12-18 DIAGNOSIS — I10 Essential (primary) hypertension: Secondary | ICD-10-CM

## 2012-12-22 ENCOUNTER — Other Ambulatory Visit: Payer: Self-pay | Admitting: Family Medicine

## 2013-01-01 ENCOUNTER — Other Ambulatory Visit: Payer: Self-pay | Admitting: Family Medicine

## 2013-01-23 ENCOUNTER — Other Ambulatory Visit: Payer: Self-pay | Admitting: Family Medicine

## 2013-01-26 ENCOUNTER — Encounter (INDEPENDENT_AMBULATORY_CARE_PROVIDER_SITE_OTHER): Payer: Medicare Other | Admitting: Ophthalmology

## 2013-01-26 DIAGNOSIS — H353290 Exudative age-related macular degeneration, unspecified eye, stage unspecified: Secondary | ICD-10-CM

## 2013-01-26 DIAGNOSIS — H3530 Unspecified macular degeneration: Secondary | ICD-10-CM

## 2013-01-26 DIAGNOSIS — I10 Essential (primary) hypertension: Secondary | ICD-10-CM

## 2013-01-26 DIAGNOSIS — H35039 Hypertensive retinopathy, unspecified eye: Secondary | ICD-10-CM

## 2013-02-10 ENCOUNTER — Emergency Department (HOSPITAL_COMMUNITY): Payer: Medicare Other

## 2013-02-10 ENCOUNTER — Encounter (HOSPITAL_COMMUNITY): Payer: Self-pay | Admitting: Emergency Medicine

## 2013-02-10 ENCOUNTER — Emergency Department (HOSPITAL_COMMUNITY)
Admission: EM | Admit: 2013-02-10 | Discharge: 2013-02-10 | Disposition: A | Discharge status: Home / Self Care | Payer: Medicare Other | Source: Home / Self Care | Attending: Emergency Medicine | Admitting: Emergency Medicine

## 2013-02-10 ENCOUNTER — Emergency Department (HOSPITAL_COMMUNITY)
Admission: EM | Admit: 2013-02-10 | Discharge: 2013-02-10 | Disposition: A | Discharge status: Home / Self Care | Payer: Medicare Other | Attending: Emergency Medicine | Admitting: Emergency Medicine

## 2013-02-10 DIAGNOSIS — I10 Essential (primary) hypertension: Secondary | ICD-10-CM | POA: Insufficient documentation

## 2013-02-10 DIAGNOSIS — S60569A Insect bite (nonvenomous) of unspecified hand, initial encounter: Secondary | ICD-10-CM | POA: Insufficient documentation

## 2013-02-10 DIAGNOSIS — W57XXXA Bitten or stung by nonvenomous insect and other nonvenomous arthropods, initial encounter: Secondary | ICD-10-CM | POA: Insufficient documentation

## 2013-02-10 DIAGNOSIS — E079 Disorder of thyroid, unspecified: Secondary | ICD-10-CM | POA: Insufficient documentation

## 2013-02-10 DIAGNOSIS — F17200 Nicotine dependence, unspecified, uncomplicated: Secondary | ICD-10-CM | POA: Insufficient documentation

## 2013-02-10 DIAGNOSIS — L02519 Cutaneous abscess of unspecified hand: Secondary | ICD-10-CM | POA: Insufficient documentation

## 2013-02-10 DIAGNOSIS — IMO0001 Reserved for inherently not codable concepts without codable children: Secondary | ICD-10-CM | POA: Insufficient documentation

## 2013-02-10 DIAGNOSIS — Y9389 Activity, other specified: Secondary | ICD-10-CM | POA: Insufficient documentation

## 2013-02-10 DIAGNOSIS — Z79899 Other long term (current) drug therapy: Secondary | ICD-10-CM | POA: Insufficient documentation

## 2013-02-10 DIAGNOSIS — Y990 Civilian activity done for income or pay: Secondary | ICD-10-CM | POA: Insufficient documentation

## 2013-02-10 DIAGNOSIS — Y929 Unspecified place or not applicable: Secondary | ICD-10-CM | POA: Insufficient documentation

## 2013-02-10 DIAGNOSIS — Y939 Activity, unspecified: Secondary | ICD-10-CM | POA: Insufficient documentation

## 2013-02-10 DIAGNOSIS — L0390 Cellulitis, unspecified: Secondary | ICD-10-CM

## 2013-02-10 DIAGNOSIS — Y92009 Unspecified place in unspecified non-institutional (private) residence as the place of occurrence of the external cause: Secondary | ICD-10-CM | POA: Insufficient documentation

## 2013-02-10 IMAGING — CR DG HAND COMPLETE 3+V*L*
3 series · 3 of 3 positions shown · non-contrast
Comparison: None.

CLINICAL DATA: Insect bite

LEFT HAND - COMPLETE 3+ VIEW

[x hand pa left]
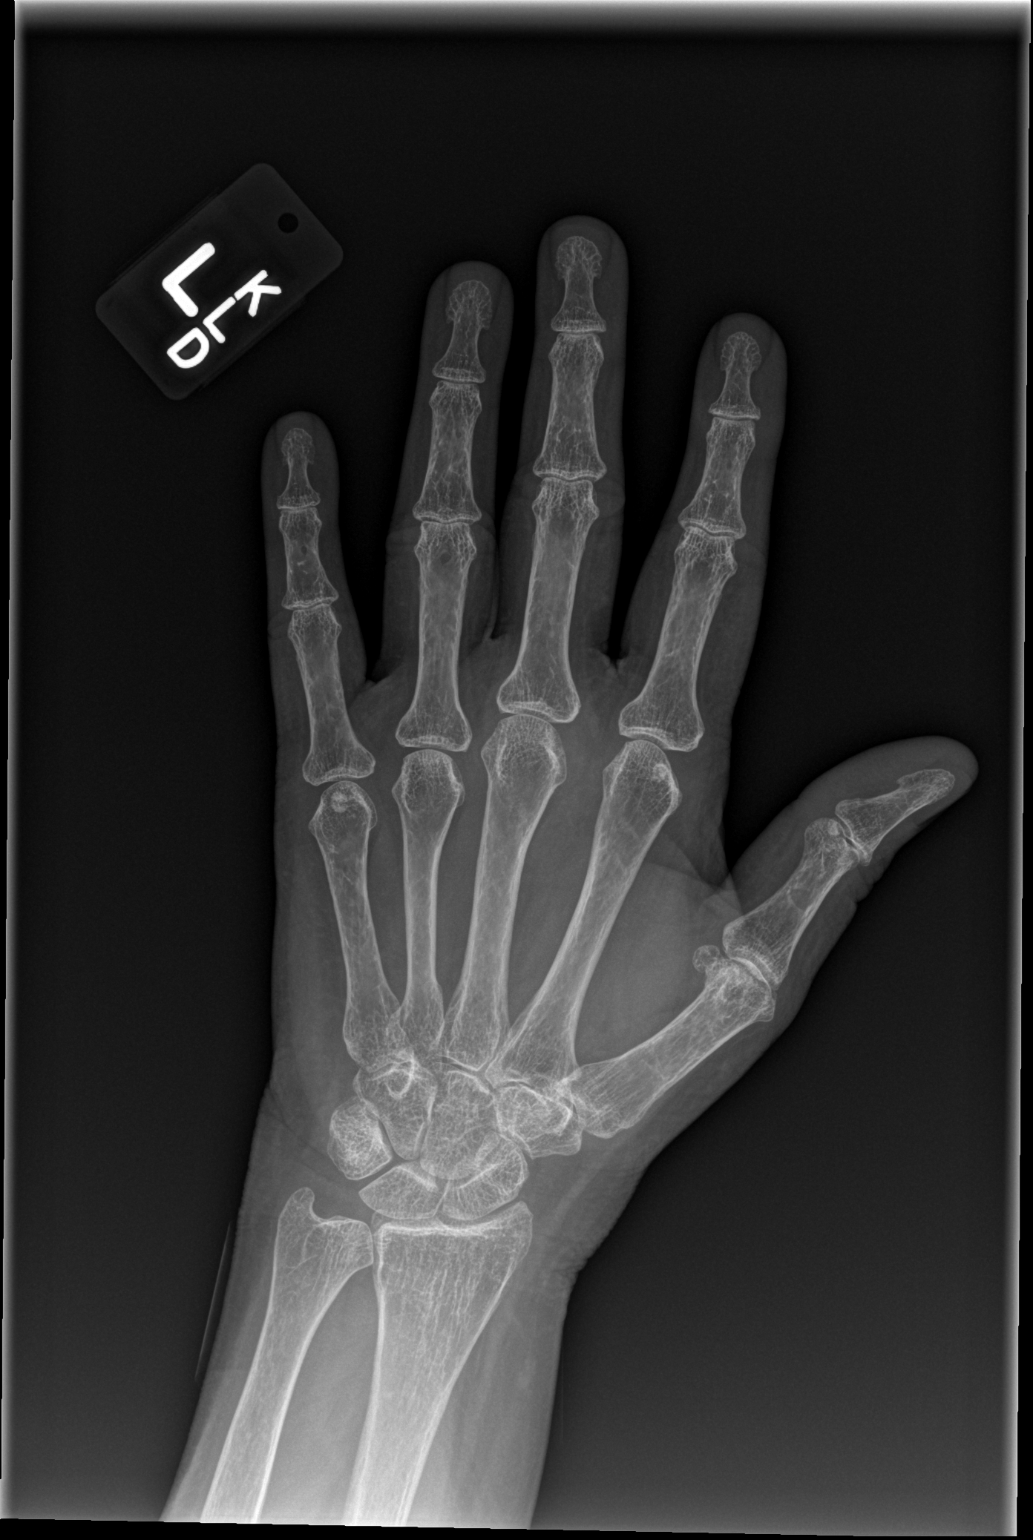

[x hand obl left]
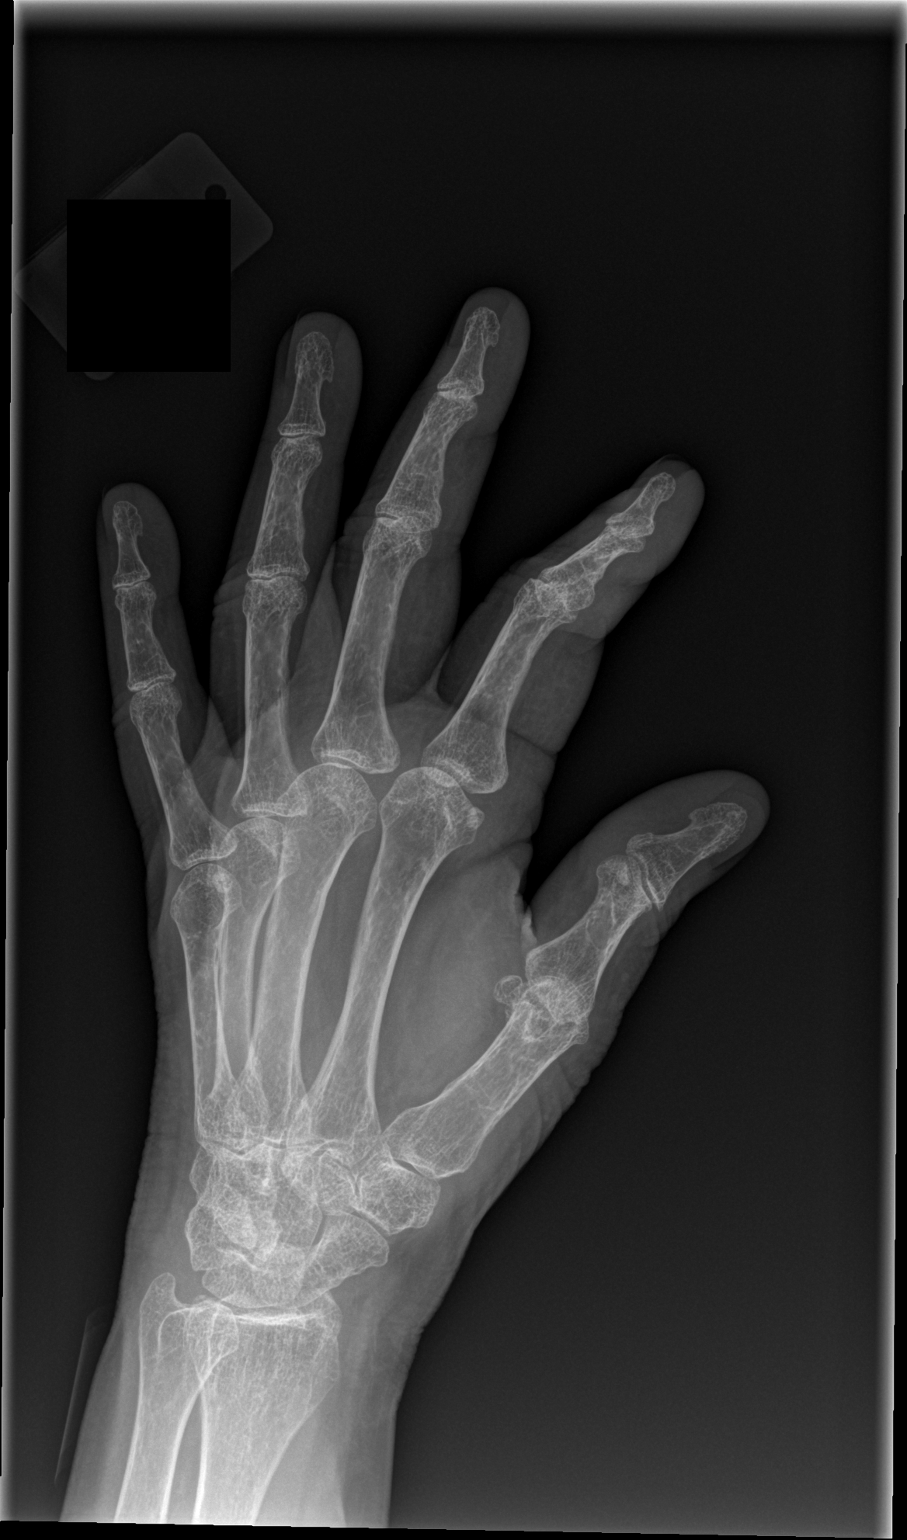

[x hand lat left]
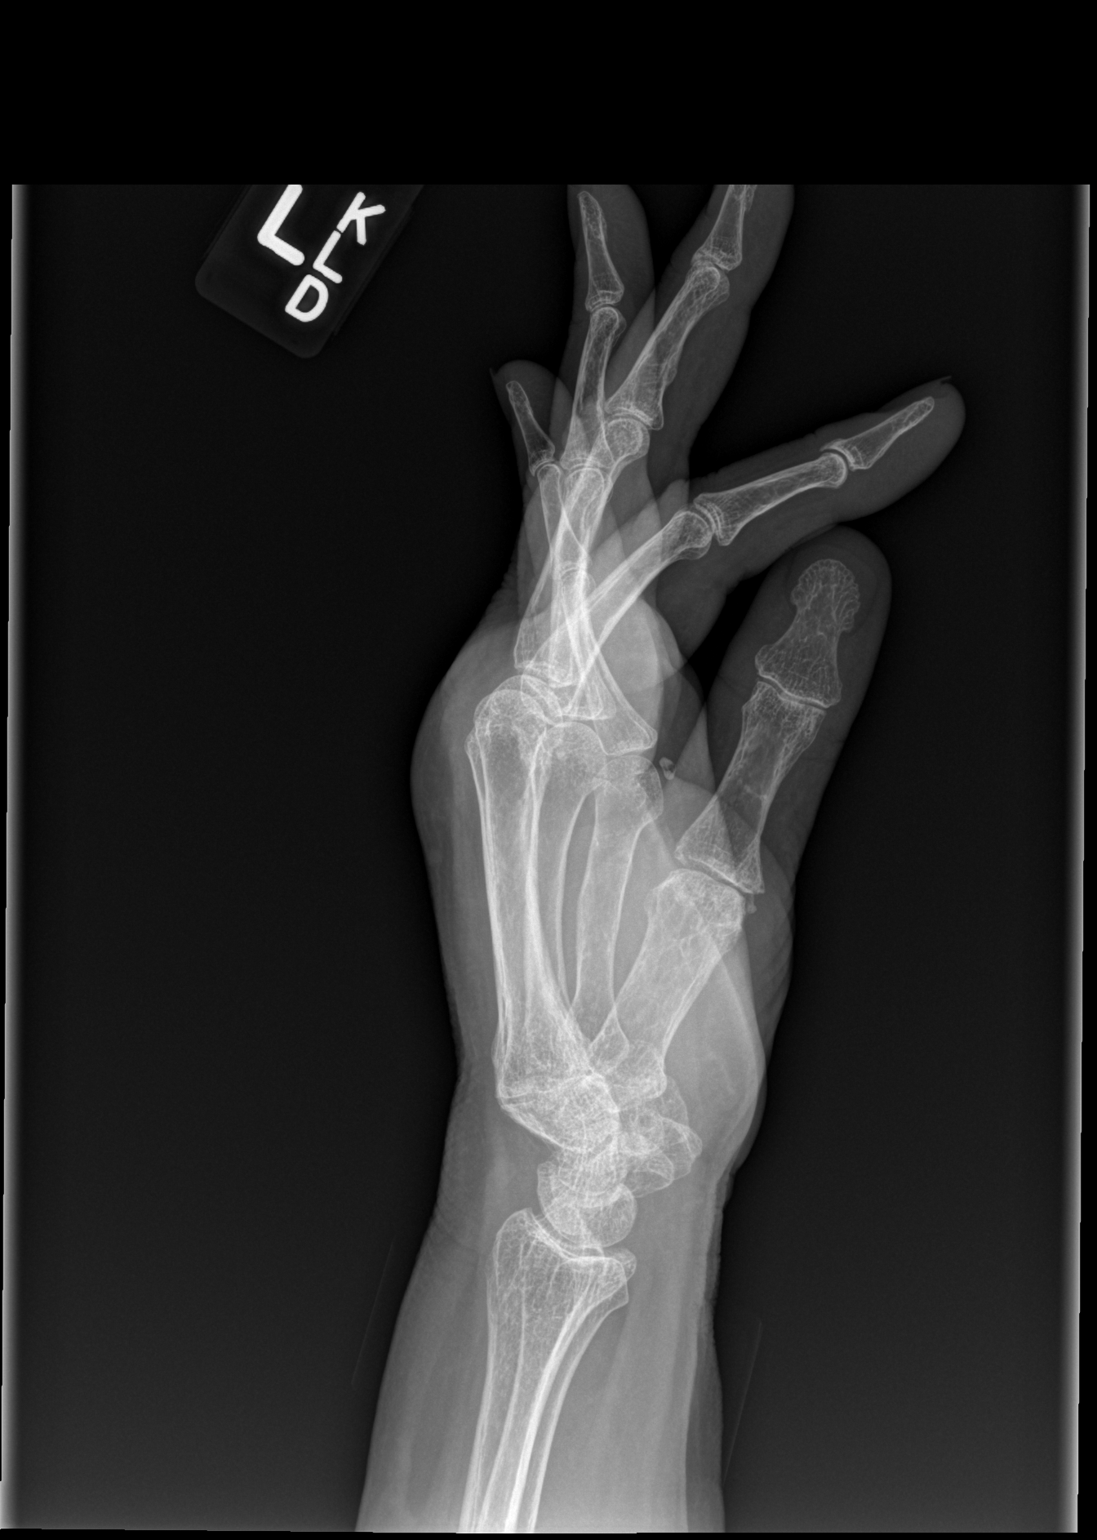

[3 of 3 positions shown; findings below may reference images not displayed]

FINDINGS: The bones are diffusely osteopenic.  There is no acute
fracture or subluxation identified.

No radio-opaque foreign body or soft tissue calcifications
identified.

No fractures or subluxations identified.  No focal bony erosions
noted.

Soft tissue swelling is noted along the dorsum of the hand in the
region of the MCP joints.
IMPRESSION: 1.  Dorsal soft tissue swelling.
2.  Osteopenia.

## 2013-02-10 MED ORDER — CEPHALEXIN 500 MG PO CAPS: 500.0000 mg | ORAL_CAPSULE | Freq: Four times a day (QID) | ORAL | Status: DC

## 2013-02-10 MED ORDER — DIPHENHYDRAMINE HCL 25 MG PO CAPS
50.0000 mg | ORAL_CAPSULE | Freq: Once | ORAL | Status: AC
  Administered 2013-02-10: 50 mg via ORAL
  Filled 2013-02-10: qty 2

## 2013-02-10 MED ORDER — CEPHALEXIN 500 MG PO CAPS
500.0000 mg | ORAL_CAPSULE | Freq: Once | ORAL | Status: AC
  Administered 2013-02-10: 500 mg via ORAL
  Filled 2013-02-10: qty 1

## 2013-02-10 NOTE — ED Provider Notes (Signed)
CSN: [id number]     Arrival date & time 02/10/13  2050 History  This chart was scribed for non-physician practitioner Earley Favor, FNP, working with Shanna Cisco, MD, by Yevette Edwards, ED Scribe. This patient was seen in room WTR9/WTR9 and the patient's care was started at 9:11 PM.   First MD Initiated Contact with Patient 02/10/13 2058     Chief Complaint  Patient presents with  . Insect Bite    The history is provided by the patient. No language interpreter was used.   HPI Comments: [name] is a 62 y.o. female who presents to the Emergency Department complaining of a suspected insect bite which occurred earlier today. She reports that the affected site has increased in swelling, but has not extended outside the area marked earlier today  The pt has not yet filled her antibiotic prescription due to finances; she plans on filling the prescription tomorrow. Her last benadryl was three hours ago. The pt was treated for the same symptoms earlier today at this ED.   Past Medical History  Diagnosis Date  . Hypertension   . Thyroid disease    No past surgical history on file. No family history on file. History  Substance Use Topics  . Smoking status: Current Every Day Smoker -- 1.00 packs/day  . Smokeless tobacco: Not on file  . Alcohol Use:    No OB history provided,  Review of Systems  Skin: Positive for color change. Negative for rash and wound.       Suspected insect bite  All other systems reviewed and are negative.    Allergies  Oxycodone hcl  Home Medications   Current Outpatient Rx  Name  Route  Sig  Dispense  Refill  . acetaminophen (TYLENOL) 500 MG tablet   Oral   Take 1,000 mg by mouth every 6 (six) hours as needed for pain.         . B Complex-C (B-COMPLEX WITH VITAMIN C) tablet   Oral   Take 1 tablet by mouth daily.         . calcium-vitamin D (OSCAL WITH D) 500-200 MG-UNIT per tablet   Oral   Take 1 tablet by mouth.         .  citalopram (CELEXA) 20 MG tablet   Oral   Take 20 mg by mouth daily.         . Flaxseed, Linseed, (FLAX SEEDS PO)   Oral   Take 1 tablet by mouth daily.         Marland Kitchen levothyroxine (SYNTHROID, LEVOTHROID) 125 MCG tablet   Oral   Take 125 mcg by mouth daily before breakfast.         . lisinopril-hydrochlorothiazide (PRINZIDE,ZESTORETIC) 20-25 MG per tablet   Oral   Take 1 tablet by mouth daily.   30 tablet   11   . Melatonin 5 MG CAPS   Oral   Take 1 capsule by mouth at bedtime as needed.         . Omega-3 Fatty Acids (FISH OIL) 300 MG CAPS   Oral   Take 1 capsule by mouth daily.         . propranolol (INDERAL) 20 MG tablet   Oral   Take 20 mg by mouth 2 (two) times daily.         . vitamin E 400 UNIT capsule   Oral   Take 400 Units by mouth daily.         Marland Kitchen  cephALEXin (KEFLEX) 500 MG capsule   Oral   Take 1 capsule (500 mg total) by mouth 4 (four) times daily.   40 capsule   0    Triage Vitals:  BP 129/72  Pulse 48  Temp(Src) 98.1 F (36.7 C) (Oral)  SpO2 100% Physical Exam  Nursing note and vitals reviewed. Constitutional: She is oriented to person, place, and time. She appears well-developed and well-nourished. No distress.  HENT:  Head: Normocephalic and atraumatic.  Eyes: EOM are normal.  Neck: Neck supple. No tracheal deviation present.  Cardiovascular: Normal rate.   Pulmonary/Chest: Effort normal. No respiratory distress.  Musculoskeletal: Normal range of motion.  Neurological: She is alert and oriented to person, place, and time.  Skin: Skin is warm and dry.  No abscess. Soft issue swelling is not outside demarcated area which was delineated earlier today during her prior ED visit.   Psychiatric: She has a normal mood and affect. Her behavior is normal.    ED Course   DIAGNOSTIC STUDIES:  Oxygen Saturation is 98% on room air, normal by my interpretation.    COORDINATION OF CARE:  9:13 PM- Discussed treatment plan with patient  which includes a dose of antibiotic, and the patient agreed to the plan.   Procedures (including critical care time)  Labs Reviewed - No data to display Dg Hand Complete Left  02/10/2013   *RADIOLOGY REPORT*  Clinical Data: Insect bite  LEFT HAND - COMPLETE 3+ VIEW  Comparison: None.  Findings: The bones are diffusely osteopenic.  There is no acute fracture or subluxation identified.  No radio-opaque foreign body or soft tissue calcifications identified.  No fractures or subluxations identified.  No focal bony erosions noted.  Soft tissue swelling is noted along the dorsum of the hand in the region of the MCP joints.  IMPRESSION:  1.  Dorsal soft tissue swelling. 2.  Osteopenia.   Original Report Authenticated By: Signa Kell, M.D.   1. Insect bite   2. Cellulitis     MDM  Patient reassured  Antibiotics started as she will not be able to purchase then until tomorrow   I personally performed the services described in this documentation, which was scribed in my presence. The recorded information has been reviewed and is accurate.   Arman Filter, NP 02/10/13 2124

## 2013-02-10 NOTE — ED Provider Notes (Signed)
CSN: [id number]     Arrival date & time 02/10/13  1237 History     First MD Initiated Contact with Patient 02/10/13 1244     Chief Complaint  Patient presents with  . Insect Bite   (Consider location/radiation/quality/duration/timing/severity/associated sxs/prior Treatment) HPI Comments: Patient presents emergency department with chief complaint of hand swelling. She states that she thinks she was bitten by an insect yesterday while working in the garden. She denies fevers, chills, nausea, vomiting. She has not tried anything to alleviate her symptoms. She states that the area is nontender. She denies any discharge from the area.  The history is provided by the patient. No language interpreter was used.    Past Medical History  Diagnosis Date  . Hypertension   . Thyroid disease    History reviewed. No pertinent past surgical history. No family history on file. History  Substance Use Topics  . Smoking status: Current Every Day Smoker -- 1.00 packs/day  . Smokeless tobacco: Not on file  . Alcohol Use:    OB History   Grav Para Term Preterm Abortions TAB SAB Ect Mult Living                 Review of Systems  All other systems reviewed and are negative.    Allergies  Oxycodone hcl  Home Medications   Current Outpatient Rx  Name  Route  Sig  Dispense  Refill  . acetaminophen (TYLENOL) 500 MG tablet   Oral   Take 1,000 mg by mouth every 6 (six) hours as needed for pain.         . B Complex-C (B-COMPLEX WITH VITAMIN C) tablet   Oral   Take 1 tablet by mouth daily.         . calcium-vitamin D (OSCAL WITH D) 500-200 MG-UNIT per tablet   Oral   Take 1 tablet by mouth.         . citalopram (CELEXA) 20 MG tablet   Oral   Take 20 mg by mouth daily.         . Flaxseed, Linseed, (FLAX SEEDS PO)   Oral   Take 1 tablet by mouth daily.         Marland Kitchen levothyroxine (SYNTHROID, LEVOTHROID) 125 MCG tablet   Oral   Take 125 mcg by mouth daily before breakfast.         . lisinopril-hydrochlorothiazide (PRINZIDE,ZESTORETIC) 20-25 MG per tablet   Oral   Take 1 tablet by mouth daily.   30 tablet   11   . Melatonin 5 MG CAPS   Oral   Take 1 capsule by mouth at bedtime as needed.         . Omega-3 Fatty Acids (FISH OIL) 300 MG CAPS   Oral   Take 1 capsule by mouth daily.         . propranolol (INDERAL) 20 MG tablet   Oral   Take 20 mg by mouth 2 (two) times daily.         . vitamin E 400 UNIT capsule   Oral   Take 400 Units by mouth daily.         . cephALEXin (KEFLEX) 500 MG capsule   Oral   Take 1 capsule (500 mg total) by mouth 4 (four) times daily.   40 capsule   0    BP 146/79  Pulse 48  Temp(Src) 98.5 F (36.9 C)  Resp 20  SpO2 98% Physical Exam  Nursing  note and vitals reviewed. Constitutional: She is oriented to person, place, and time. She appears well-developed and well-nourished.  HENT:  Head: Normocephalic and atraumatic.  Eyes: Conjunctivae and EOM are normal. Pupils are equal, round, and reactive to light.  Neck: Normal range of motion. Neck supple.  Cardiovascular: Normal rate and regular rhythm.  Exam reveals no gallop and no friction rub.   No murmur heard. Pulmonary/Chest: Effort normal and breath sounds normal. No respiratory distress. She has no wheezes. She has no rales. She exhibits no tenderness.  Abdominal: Soft. Bowel sounds are normal. She exhibits no distension and no mass. There is no tenderness. There is no rebound and no guarding.  Musculoskeletal: Normal range of motion. She exhibits no edema and no tenderness.  Left hand range of motion and strength 5/5  Neurological: She is alert and oriented to person, place, and time.  Skin: Skin is warm and dry.  3 x 3 cm area of swelling and inflammation over the third knuckle, nausea and abscess or infection, appears to be local allergic reaction  Psychiatric: She has a normal mood and affect. Her behavior is normal. Judgment and thought content  normal.    ED Course   Procedures (including critical care time)  Labs Reviewed - No data to display Dg Hand Complete Left  02/10/2013   *RADIOLOGY REPORT*  Clinical Data: Insect bite  LEFT HAND - COMPLETE 3+ VIEW  Comparison: None.  Findings: The bones are diffusely osteopenic.  There is no acute fracture or subluxation identified.  No radio-opaque foreign body or soft tissue calcifications identified.  No fractures or subluxations identified.  No focal bony erosions noted.  Soft tissue swelling is noted along the dorsum of the hand in the region of the MCP joints.  IMPRESSION:  1.  Dorsal soft tissue swelling. 2.  Osteopenia.   Original Report Authenticated By: Signa Kell, M.D.   1. Insect bite     MDM  Patient with possible insect bite versus allergic reaction versus sting. Treat the patient with Benadryl, with marked the affected area with a skin marker, and recommend close followup. Specific return precautions were given. Patient is given a prescription for Keflex in case of underlying infection. Patient understands and agrees to plan. She is stable and ready for discharge.  Roxy Horseman, PA-C 02/10/13 1435

## 2013-02-10 NOTE — ED Notes (Signed)
Pt present with swollen area to left hand, states she was bitten yesterday working in the garden. Does not know what bite her.

## 2013-02-11 NOTE — ED Provider Notes (Signed)
Medical screening examination/treatment/procedure(s) were performed by non-physician practitioner and as supervising physician I was immediately available for consultation/collaboration.   Danne Vasek M Ivaan Liddy, MD 02/11/13 0735 

## 2013-02-11 NOTE — ED Provider Notes (Signed)
Medical screening examination/treatment/procedure(s) were performed by non-physician practitioner and as supervising physician I was immediately available for consultation/collaboration.   Shanna Cisco, MD 02/11/13 1046

## 2013-02-16 ENCOUNTER — Encounter (INDEPENDENT_AMBULATORY_CARE_PROVIDER_SITE_OTHER): Payer: Medicare Other | Admitting: Ophthalmology

## 2013-02-16 DIAGNOSIS — H35039 Hypertensive retinopathy, unspecified eye: Secondary | ICD-10-CM

## 2013-02-16 DIAGNOSIS — H3530 Unspecified macular degeneration: Secondary | ICD-10-CM

## 2013-02-16 DIAGNOSIS — H353290 Exudative age-related macular degeneration, unspecified eye, stage unspecified: Secondary | ICD-10-CM

## 2013-02-16 DIAGNOSIS — H43819 Vitreous degeneration, unspecified eye: Secondary | ICD-10-CM

## 2013-02-16 DIAGNOSIS — I10 Essential (primary) hypertension: Secondary | ICD-10-CM

## 2013-03-09 ENCOUNTER — Encounter (INDEPENDENT_AMBULATORY_CARE_PROVIDER_SITE_OTHER): Payer: Medicare Other | Admitting: Ophthalmology

## 2013-03-09 DIAGNOSIS — H35039 Hypertensive retinopathy, unspecified eye: Secondary | ICD-10-CM

## 2013-03-09 DIAGNOSIS — H353290 Exudative age-related macular degeneration, unspecified eye, stage unspecified: Secondary | ICD-10-CM

## 2013-03-09 DIAGNOSIS — H3530 Unspecified macular degeneration: Secondary | ICD-10-CM

## 2013-03-09 DIAGNOSIS — H43819 Vitreous degeneration, unspecified eye: Secondary | ICD-10-CM

## 2013-03-09 DIAGNOSIS — I10 Essential (primary) hypertension: Secondary | ICD-10-CM

## 2013-03-27 ENCOUNTER — Encounter (INDEPENDENT_AMBULATORY_CARE_PROVIDER_SITE_OTHER): Payer: Medicare Other | Admitting: Ophthalmology

## 2013-03-27 DIAGNOSIS — H43819 Vitreous degeneration, unspecified eye: Secondary | ICD-10-CM

## 2013-03-27 DIAGNOSIS — H3530 Unspecified macular degeneration: Secondary | ICD-10-CM

## 2013-03-27 DIAGNOSIS — H353290 Exudative age-related macular degeneration, unspecified eye, stage unspecified: Secondary | ICD-10-CM

## 2013-03-27 DIAGNOSIS — H35039 Hypertensive retinopathy, unspecified eye: Secondary | ICD-10-CM

## 2013-03-27 DIAGNOSIS — I10 Essential (primary) hypertension: Secondary | ICD-10-CM

## 2013-03-30 ENCOUNTER — Other Ambulatory Visit: Payer: Self-pay | Admitting: Sports Medicine

## 2013-04-02 ENCOUNTER — Other Ambulatory Visit: Payer: Self-pay | Admitting: Sports Medicine

## 2013-04-08 ENCOUNTER — Other Ambulatory Visit: Payer: Self-pay | Admitting: Sports Medicine

## 2013-04-08 DIAGNOSIS — E018 Other iodine-deficiency related thyroid disorders and allied conditions: Secondary | ICD-10-CM

## 2013-04-08 MED ORDER — LEVOTHYROXINE SODIUM 125 MCG PO TABS: 125.0000 ug | ORAL_TABLET | Freq: Every day | ORAL | Status: DC

## 2013-04-13 ENCOUNTER — Encounter: Payer: Self-pay | Admitting: Sports Medicine

## 2013-04-13 ENCOUNTER — Ambulatory Visit (INDEPENDENT_AMBULATORY_CARE_PROVIDER_SITE_OTHER): Payer: Medicare Other | Admitting: Sports Medicine

## 2013-04-13 VITALS — BP 126/76 | HR 47 | Temp 99.0°F | Ht 63.0 in | Wt 188.2 lb

## 2013-04-13 DIAGNOSIS — R001 Bradycardia, unspecified: Secondary | ICD-10-CM

## 2013-04-13 DIAGNOSIS — Z299 Encounter for prophylactic measures, unspecified: Secondary | ICD-10-CM

## 2013-04-13 DIAGNOSIS — I10 Essential (primary) hypertension: Secondary | ICD-10-CM

## 2013-04-13 DIAGNOSIS — K7030 Alcoholic cirrhosis of liver without ascites: Secondary | ICD-10-CM

## 2013-04-13 DIAGNOSIS — F17200 Nicotine dependence, unspecified, uncomplicated: Secondary | ICD-10-CM

## 2013-04-13 DIAGNOSIS — E018 Other iodine-deficiency related thyroid disorders and allied conditions: Secondary | ICD-10-CM

## 2013-04-13 DIAGNOSIS — R011 Cardiac murmur, unspecified: Secondary | ICD-10-CM | POA: Insufficient documentation

## 2013-04-13 DIAGNOSIS — I498 Other specified cardiac arrhythmias: Secondary | ICD-10-CM

## 2013-04-13 LAB — LIPID PANEL
HDL: 51 mg/dL (ref >39)
LDL Cholesterol: 98 mg/dL (ref 0–99)
Total CHOL/HDL Ratio: 3.4 Ratio

## 2013-04-13 LAB — CBC
HCT: 36.6 % (ref 36.0–46.0)
Platelets: 171 10*3/uL (ref 150–400)
RBC: 4.09 MIL/uL (ref 3.87–5.11)
RDW: 13.3 % (ref 11.5–15.5)
WBC: 7.3 10*3/uL (ref 4.0–10.5)

## 2013-04-13 LAB — COMPREHENSIVE METABOLIC PANEL
ALT: 13 U/L (ref 0–35)
AST: 15 U/L (ref 0–37)
Albumin: 4.4 g/dL (ref 3.5–5.2)
Calcium: 9.9 mg/dL (ref 8.4–10.5)
Chloride: 100 mEq/L (ref 96–112)
Potassium: 3.9 mEq/L (ref 3.5–5.3)

## 2013-04-13 MED ORDER — AMLODIPINE BESYLATE 5 MG PO TABS: 5.0000 mg | ORAL_TABLET | Freq: Every day | ORAL | Status: DC

## 2013-04-13 NOTE — Assessment & Plan Note (Signed)
CMET today. > Follow up alcohol use

## 2013-04-13 NOTE — Assessment & Plan Note (Signed)
Likely AS No records previously Likely needs to be evaluated

## 2013-04-13 NOTE — Assessment & Plan Note (Signed)
Basic Labs today Due for Colonoscopy and mammogram - given information for mammogram >PAP Smear next visit

## 2013-04-13 NOTE — Assessment & Plan Note (Addendum)
Markedly elevated Stop Propranolol given bradycardia Start Amlodipine

## 2013-04-13 NOTE — Progress Notes (Signed)
[facility] [name] - 62 y.o. female MRN [id number]  Date of birth: September 12, 1950  CC, HPI, Interval History & ROS  [name] is here today to followup on her chronic medical conditions including:  HTN, Depression, Tobacco Dependence, Sleep Disorder   She reports overall feels like she is doing well and is healthy  Sleep is poor - sleep onset difficult.  No orthopnea.   Pt denies chest pain, palpitations, shortness of breath/DOE, orthopnea/PND, LE swelling.  No polyuria, polydipsia, blurry vision, chest pain, dyspnea or claudication.  No foot burning, numbness or pain. Taking medication compliantly without noted sided effects. Follows diet fairly well.  Unknown reasons for Propranolol.    Pertinent History & Care Coordination   History  Smoking status  . Current Every Day Smoker -- 1.00 packs/day  Smokeless tobacco  . Not on file   Health Maintenance Due  Topic  . Colonoscopy   . Zostavax   . Pap Smear    No results found for this basename: HGBA1C, TRIG, CHOL, HDL, LDLCALC, TSH,  in the last 8760 hours   Otherwise past Medical, Surgical, Social, and Family History Reviewed per EMR Medications and Allergies reviewed and all updated if necessary. Objective Findings  VITALS: HR: 47 bpm  BP: 126/76 mmHg  TEMP: 99 F (37.2 C) (Oral)  RESP: 98 %  HT: 5\' 3"  (160 cm)  WT: 188 lb 3.2 oz (85.367 kg)  BMI: 33.4   BP Readings from Last 3 Encounters:  04/13/13 126/76  02/10/13 129/72  02/10/13 125/79   Wt Readings from Last 3 Encounters:  04/13/13 188 lb 3.2 oz (85.367 kg)  11/21/11 182 lb 4.8 oz (82.691 kg)  09/09/11 181 lb (82.101 kg)     PHYSICAL EXAM: GENERAL:  adult Caucasian female. In no discomfort; no respiratory distress  PSYCH: alert and appropriate, good insight   HNEENT:   CARDIO: RRR, S1/S2 heard, 2/6 systolic ejection murmur  LUNGS:  coarse lung sounds throughout, no focal consolidation, no wheezing   ABDOMEN:   EXTREM:  Warm, well  perfused.  Moves all 4 extremities spontaneously; no lateralization.  No noted foot lesions.  Distal pulses 1+/4.  no pretibial edema.  GU:   SKIN:     Assessment & Plan   Problems addressed today: General Plan & Pt Instructions:  1. Undiagnosed cardiac murmurs   2. HYPERTENSION, BENIGN ESSENTIAL   3. Preventive measure   4. HYPOTHYROIDISM, POST-RADIOACTIVE IODINE   5. TOBACCO ABUSE   6. Bradycardia   7. CIRRHOSIS, ALCOHOLIC, LIVER       Basic Labs today  STOP Vitamin E   Stop your Propanolol & Start Amlodipine  Due for Mammogram, Colonoscopy  PAP SMEAR next visit  QUIT SMOKING     For further discussion of A/P and for follow up issues see problem based charting.

## 2013-04-13 NOTE — Assessment & Plan Note (Signed)
TSH today 

## 2013-04-13 NOTE — Assessment & Plan Note (Signed)
Stop  beta blocker. Start amlodipine for blood pressure control instead

## 2013-04-13 NOTE — Assessment & Plan Note (Signed)
Encouraged to quit. 

## 2013-04-13 NOTE — Patient Instructions (Signed)
   Basic Labs today  STOP Vitamin E   Stop your Propanolol & Start Amlodipine  Due for Mammogram, Colonoscopy  PAP SMEAR next visit  QUIT SMOKING   If you need anything prior to your next visit please call the clinic. Please Bring all medications or accurate medication list with you to each appointment; an accurate medication list is essential in providing you the best care possible.

## 2013-04-21 ENCOUNTER — Other Ambulatory Visit: Payer: Self-pay

## 2013-04-21 DIAGNOSIS — Z1231 Encounter for screening mammogram for malignant neoplasm of breast: Secondary | ICD-10-CM

## 2013-04-23 ENCOUNTER — Encounter: Payer: Self-pay | Admitting: Sports Medicine

## 2013-04-27 ENCOUNTER — Encounter (INDEPENDENT_AMBULATORY_CARE_PROVIDER_SITE_OTHER): Payer: Medicare Other | Admitting: Ophthalmology

## 2013-04-27 DIAGNOSIS — I10 Essential (primary) hypertension: Secondary | ICD-10-CM

## 2013-04-27 DIAGNOSIS — H353290 Exudative age-related macular degeneration, unspecified eye, stage unspecified: Secondary | ICD-10-CM

## 2013-04-27 DIAGNOSIS — H43819 Vitreous degeneration, unspecified eye: Secondary | ICD-10-CM

## 2013-04-27 DIAGNOSIS — H3530 Unspecified macular degeneration: Secondary | ICD-10-CM

## 2013-04-27 DIAGNOSIS — H35039 Hypertensive retinopathy, unspecified eye: Secondary | ICD-10-CM

## 2013-05-08 ENCOUNTER — Ambulatory Visit: Payer: Medicare Other | Admitting: Sports Medicine

## 2013-05-13 ENCOUNTER — Other Ambulatory Visit: Payer: Self-pay | Admitting: Sports Medicine

## 2013-05-13 ENCOUNTER — Ambulatory Visit
Admission: RE | Admit: 2013-05-13 | Discharge: 2013-05-13 | Disposition: A | Discharge status: Home / Self Care | Payer: Medicare Other | Source: Ambulatory Visit

## 2013-05-13 DIAGNOSIS — Z1231 Encounter for screening mammogram for malignant neoplasm of breast: Secondary | ICD-10-CM

## 2013-05-18 ENCOUNTER — Ambulatory Visit (INDEPENDENT_AMBULATORY_CARE_PROVIDER_SITE_OTHER): Payer: Medicare Other | Admitting: Sports Medicine

## 2013-05-18 ENCOUNTER — Encounter: Payer: Self-pay | Admitting: Sports Medicine

## 2013-05-18 VITALS — BP 128/58 | HR 77 | Temp 98.9°F | Ht 63.0 in | Wt 189.6 lb

## 2013-05-18 DIAGNOSIS — F17200 Nicotine dependence, unspecified, uncomplicated: Secondary | ICD-10-CM

## 2013-05-18 DIAGNOSIS — F39 Unspecified mood [affective] disorder: Secondary | ICD-10-CM

## 2013-05-18 DIAGNOSIS — E018 Other iodine-deficiency related thyroid disorders and allied conditions: Secondary | ICD-10-CM

## 2013-05-18 DIAGNOSIS — R011 Cardiac murmur, unspecified: Secondary | ICD-10-CM

## 2013-05-18 DIAGNOSIS — Z299 Encounter for prophylactic measures, unspecified: Secondary | ICD-10-CM

## 2013-05-18 DIAGNOSIS — I10 Essential (primary) hypertension: Secondary | ICD-10-CM

## 2013-05-18 MED ORDER — LEVOTHYROXINE SODIUM 125 MCG PO TABS: 125.0000 ug | ORAL_TABLET | Freq: Every day | ORAL | Status: DC

## 2013-05-18 MED ORDER — ATORVASTATIN CALCIUM 20 MG PO TABS: 20.0000 mg | ORAL_TABLET | Freq: Every day | ORAL | Status: DC

## 2013-05-18 MED ORDER — CITALOPRAM HYDROBROMIDE 20 MG PO TABS: 20.0000 mg | ORAL_TABLET | Freq: Every day | ORAL | Status: DC

## 2013-05-18 NOTE — Assessment & Plan Note (Addendum)
Start Lipitor - due to  ASCVD risk of11.9%

## 2013-05-18 NOTE — Patient Instructions (Signed)
   Stop Amilodipine  Start Lipitor  ECHO    If you need anything prior to your next visit please call the clinic. Please Bring all medications or accurate medication list with you to each appointment; an accurate medication list is essential in providing you the best care possible.

## 2013-05-18 NOTE — Assessment & Plan Note (Addendum)
Unclear significance given syncope episode Evaluate with 2-D echo > Consider followup with cardiology reviewed red flags for emergent care

## 2013-05-18 NOTE — Progress Notes (Signed)
  [name] - 62 y.o. female MRN [id number]  Date of birth: 1951-05-03  CC, HPI, Interval History & ROS  [name] is here today to followup on her chronic medical conditions including:  Hypertension, cardiac murmur, tobacco abuse, difficulty sleeping, mood disorder.  She reports 1 episode of syncope while sitting at table, on day 2 of taking new medication.  No other orthostasis.  No palpitations.    Pt denies chest pain, dyspnea at rest or exertion, PND, new mild lower extremity edema.  Patient denies any facial asymmetry, unilateral weakness, or dysarthria.  Pt denies hypoglycemic symptoms/episodes.  No reported polyuria/polydipsia. Pt is compliant with foot exams and denies any new foot lesions or new sensory changes/dysesthesias.  Other acute problems include: None  Pertinent History & Care Coordination  No Patient Care Coordination Note on file.  History  Smoking status  . Current Every Day Smoker -- 1.00 packs/day  Smokeless tobacco  . Not on file   Health Maintenance Due  Topic  . Colonoscopy   . Zostavax   . Pap Smear     Recent Labs  04/13/13 0957  TRIG 117  CHOL 172  HDL 51  LDLCALC 98  TSH 2.185     Otherwise past Medical, Surgical, Social, and Family History Reviewed per EMR Medications and Allergies reviewed and all updated if necessary. Objective Findings  VITALS: HR: 77 bpm  BP: 128/58 mmHg  TEMP: 98.9 F (37.2 C) (Oral)  RESP:    HT: 5\' 3"  (160 cm)  WT: 189 lb 9.6 oz (86.002 kg)  BMI: 33.7   BP Readings from Last 3 Encounters:  05/18/13 128/58  04/13/13 126/76  02/10/13 129/72   Wt Readings from Last 3 Encounters:  05/18/13 189 lb 9.6 oz (86.002 kg)  04/13/13 188 lb 3.2 oz (85.367 kg)  11/21/11 182 lb 4.8 oz (82.691 kg)     PHYSICAL EXAM: GENERAL:  adult Caucasian obese female. In no discomfort; no respiratory distress  PSYCH: alert and appropriate, good insight   HNEENT:   CARDIO: RRR, S1/S2 heard, 2/6 SEM heard best at the  left upper sternal border   LUNGS:  coarse breath sounds, no wheezing, no focal rhonchi   ABDOMEN: +BS, soft, non-tender, no rigidity, no guarding, no masses/hepatosplenomegaly  EXTREM:  Warm, well perfused.  Moves all 4 extremities spontaneously; no lateralization. Distal pulses 1+/4.  Trace pretibial edema.  GU:   SKIN:     Assessment & Plan   Problems addressed today: General Plan & Pt Instructions:  1. HYPERTENSION, BENIGN ESSENTIAL   2. Preventive measure   3. Undiagnosed cardiac murmurs   4. HYPOTHYROIDISM, POST-RADIOACTIVE IODINE   5. Mood disorder       Stop Amilodipine  Start Lipitor  ECHO      For further discussion of A/P and for follow up issues see problem based charting.

## 2013-05-19 DIAGNOSIS — F39 Unspecified mood [affective] disorder: Secondary | ICD-10-CM | POA: Insufficient documentation

## 2013-05-19 NOTE — Assessment & Plan Note (Signed)
Unclear syncopal episode.  Given good control of blood pressure and lower extremity swelling it may be associated with amlodipine will discontinue amlodipine.

## 2013-05-19 NOTE — Assessment & Plan Note (Signed)
Stable on Celexa.  Refill provided today.

## 2013-05-19 NOTE — Assessment & Plan Note (Signed)
Encouraged quit 

## 2013-05-19 NOTE — Assessment & Plan Note (Signed)
Refill for Synthroid.  Last TSH appropriate

## 2013-06-01 ENCOUNTER — Encounter (INDEPENDENT_AMBULATORY_CARE_PROVIDER_SITE_OTHER): Payer: Medicare Other | Admitting: Ophthalmology

## 2013-06-02 ENCOUNTER — Encounter (INDEPENDENT_AMBULATORY_CARE_PROVIDER_SITE_OTHER): Payer: Medicare Other | Admitting: Ophthalmology

## 2013-06-02 DIAGNOSIS — H35039 Hypertensive retinopathy, unspecified eye: Secondary | ICD-10-CM

## 2013-06-02 DIAGNOSIS — I10 Essential (primary) hypertension: Secondary | ICD-10-CM

## 2013-06-02 DIAGNOSIS — H3530 Unspecified macular degeneration: Secondary | ICD-10-CM

## 2013-06-02 DIAGNOSIS — H353290 Exudative age-related macular degeneration, unspecified eye, stage unspecified: Secondary | ICD-10-CM

## 2013-06-02 DIAGNOSIS — H43819 Vitreous degeneration, unspecified eye: Secondary | ICD-10-CM

## 2013-06-13 ENCOUNTER — Other Ambulatory Visit: Payer: Self-pay | Admitting: Sports Medicine

## 2013-06-30 ENCOUNTER — Encounter (INDEPENDENT_AMBULATORY_CARE_PROVIDER_SITE_OTHER): Payer: Medicare Other | Admitting: Ophthalmology

## 2013-07-06 ENCOUNTER — Encounter (INDEPENDENT_AMBULATORY_CARE_PROVIDER_SITE_OTHER): Payer: Medicare Other | Admitting: Ophthalmology

## 2013-07-13 ENCOUNTER — Encounter (INDEPENDENT_AMBULATORY_CARE_PROVIDER_SITE_OTHER): Payer: Medicare HMO | Admitting: Ophthalmology

## 2013-07-18 ENCOUNTER — Inpatient Hospital Stay (HOSPITAL_COMMUNITY)
Admission: EM | Admit: 2013-07-18 | Discharge: 2013-07-24 | DRG: 483 | Disposition: A | Discharge status: Skilled Nursing Facility | Payer: Medicare HMO | Attending: Orthopedic Surgery | Admitting: Orthopedic Surgery

## 2013-07-18 ENCOUNTER — Encounter (HOSPITAL_COMMUNITY): Payer: Self-pay | Admitting: Emergency Medicine

## 2013-07-18 ENCOUNTER — Emergency Department (HOSPITAL_COMMUNITY): Payer: Medicare HMO

## 2013-07-18 DIAGNOSIS — Z7901 Long term (current) use of anticoagulants: Secondary | ICD-10-CM

## 2013-07-18 DIAGNOSIS — Z6833 Body mass index (BMI) 33.0-33.9, adult: Secondary | ICD-10-CM

## 2013-07-18 DIAGNOSIS — S82209A Unspecified fracture of shaft of unspecified tibia, initial encounter for closed fracture: Secondary | ICD-10-CM | POA: Diagnosis present

## 2013-07-18 DIAGNOSIS — Z79899 Other long term (current) drug therapy: Secondary | ICD-10-CM

## 2013-07-18 DIAGNOSIS — E039 Hypothyroidism, unspecified: Secondary | ICD-10-CM | POA: Diagnosis present

## 2013-07-18 DIAGNOSIS — F17200 Nicotine dependence, unspecified, uncomplicated: Secondary | ICD-10-CM | POA: Diagnosis present

## 2013-07-18 DIAGNOSIS — S42213A Unspecified displaced fracture of surgical neck of unspecified humerus, initial encounter for closed fracture: Principal | ICD-10-CM | POA: Diagnosis present

## 2013-07-18 DIAGNOSIS — S82409A Unspecified fracture of shaft of unspecified fibula, initial encounter for closed fracture: Secondary | ICD-10-CM | POA: Diagnosis present

## 2013-07-18 DIAGNOSIS — Z9851 Tubal ligation status: Secondary | ICD-10-CM

## 2013-07-18 DIAGNOSIS — I10 Essential (primary) hypertension: Secondary | ICD-10-CM | POA: Diagnosis present

## 2013-07-18 DIAGNOSIS — Y92009 Unspecified place in unspecified non-institutional (private) residence as the place of occurrence of the external cause: Secondary | ICD-10-CM

## 2013-07-18 DIAGNOSIS — W108XXA Fall (on) (from) other stairs and steps, initial encounter: Secondary | ICD-10-CM | POA: Diagnosis present

## 2013-07-18 DIAGNOSIS — S42293A Other displaced fracture of upper end of unspecified humerus, initial encounter for closed fracture: Secondary | ICD-10-CM | POA: Diagnosis present

## 2013-07-18 DIAGNOSIS — D62 Acute posthemorrhagic anemia: Secondary | ICD-10-CM | POA: Diagnosis not present

## 2013-07-18 DIAGNOSIS — K7460 Unspecified cirrhosis of liver: Secondary | ICD-10-CM | POA: Diagnosis present

## 2013-07-18 DIAGNOSIS — E669 Obesity, unspecified: Secondary | ICD-10-CM | POA: Diagnosis present

## 2013-07-18 DIAGNOSIS — S42309A Unspecified fracture of shaft of humerus, unspecified arm, initial encounter for closed fracture: Secondary | ICD-10-CM

## 2013-07-18 DIAGNOSIS — S42201A Unspecified fracture of upper end of right humerus, initial encounter for closed fracture: Secondary | ICD-10-CM | POA: Diagnosis present

## 2013-07-18 HISTORY — DX: Hypothyroidism, unspecified: E03.9

## 2013-07-18 HISTORY — DX: Unspecified cirrhosis of liver: K74.60

## 2013-07-18 LAB — CBC WITH DIFFERENTIAL/PLATELET
Basophils Absolute: 0 10*3/uL (ref 0.0–0.1)
Basophils Relative: 0 % (ref 0–1)
EOS ABS: 0.3 10*3/uL (ref 0.0–0.7)
EOS PCT: 3 % (ref 0–5)
HEMATOCRIT: 30.8 % — AB (ref 36.0–46.0)
HEMOGLOBIN: 11.4 g/dL — AB (ref 12.0–15.0)
LYMPHS ABS: 2.3 10*3/uL (ref 0.7–4.0)
LYMPHS PCT: 24 % (ref 12–46)
MCH: 30.9 pg (ref 26.0–34.0)
MCHC: 37 g/dL — ABNORMAL HIGH (ref 30.0–36.0)
MCV: 83.5 fL (ref 78.0–100.0)
MONO ABS: 0.4 10*3/uL (ref 0.1–1.0)
MONOS PCT: 5 % (ref 3–12)
Neutro Abs: 6.7 10*3/uL (ref 1.7–7.7)
Neutrophils Relative %: 69 % (ref 43–77)
Platelets: 149 10*3/uL — ABNORMAL LOW (ref 150–400)
RBC: 3.69 MIL/uL — AB (ref 3.87–5.11)
RDW: 12.6 % (ref 11.5–15.5)
WBC: 9.7 10*3/uL (ref 4.0–10.5)

## 2013-07-18 LAB — COMPREHENSIVE METABOLIC PANEL
ALT: 23 U/L (ref 0–35)
AST: 23 U/L (ref 0–37)
Albumin: 3.9 g/dL (ref 3.5–5.2)
Alkaline Phosphatase: 83 U/L (ref 39–117)
BUN: 15 mg/dL (ref 6–23)
CALCIUM: 9.3 mg/dL (ref 8.4–10.5)
CO2: 25 meq/L (ref 19–32)
Chloride: 98 mEq/L (ref 96–112)
Creatinine, Ser: 0.65 mg/dL (ref 0.50–1.10)
GLUCOSE: 104 mg/dL — AB (ref 70–99)
Potassium: 4 mEq/L (ref 3.7–5.3)
Sodium: 135 mEq/L — ABNORMAL LOW (ref 137–147)
TOTAL PROTEIN: 6.9 g/dL (ref 6.0–8.3)
Total Bilirubin: 1.1 mg/dL (ref 0.3–1.2)

## 2013-07-18 LAB — PROTIME-INR
INR: 1.1 (ref 0.00–1.49)
PROTHROMBIN TIME: 14 s (ref 11.6–15.2)

## 2013-07-18 IMAGING — CR DG TIBIA/FIBULA 2V*R*
4 series · 4 of 4 positions shown · non-contrast
Comparison: None.

CLINICAL DATA: Pain post fall.

EXAM:
RIGHT TIBIA AND FIBULA - 2 VIEW

[x tib-fib lat right (1 of 3)]
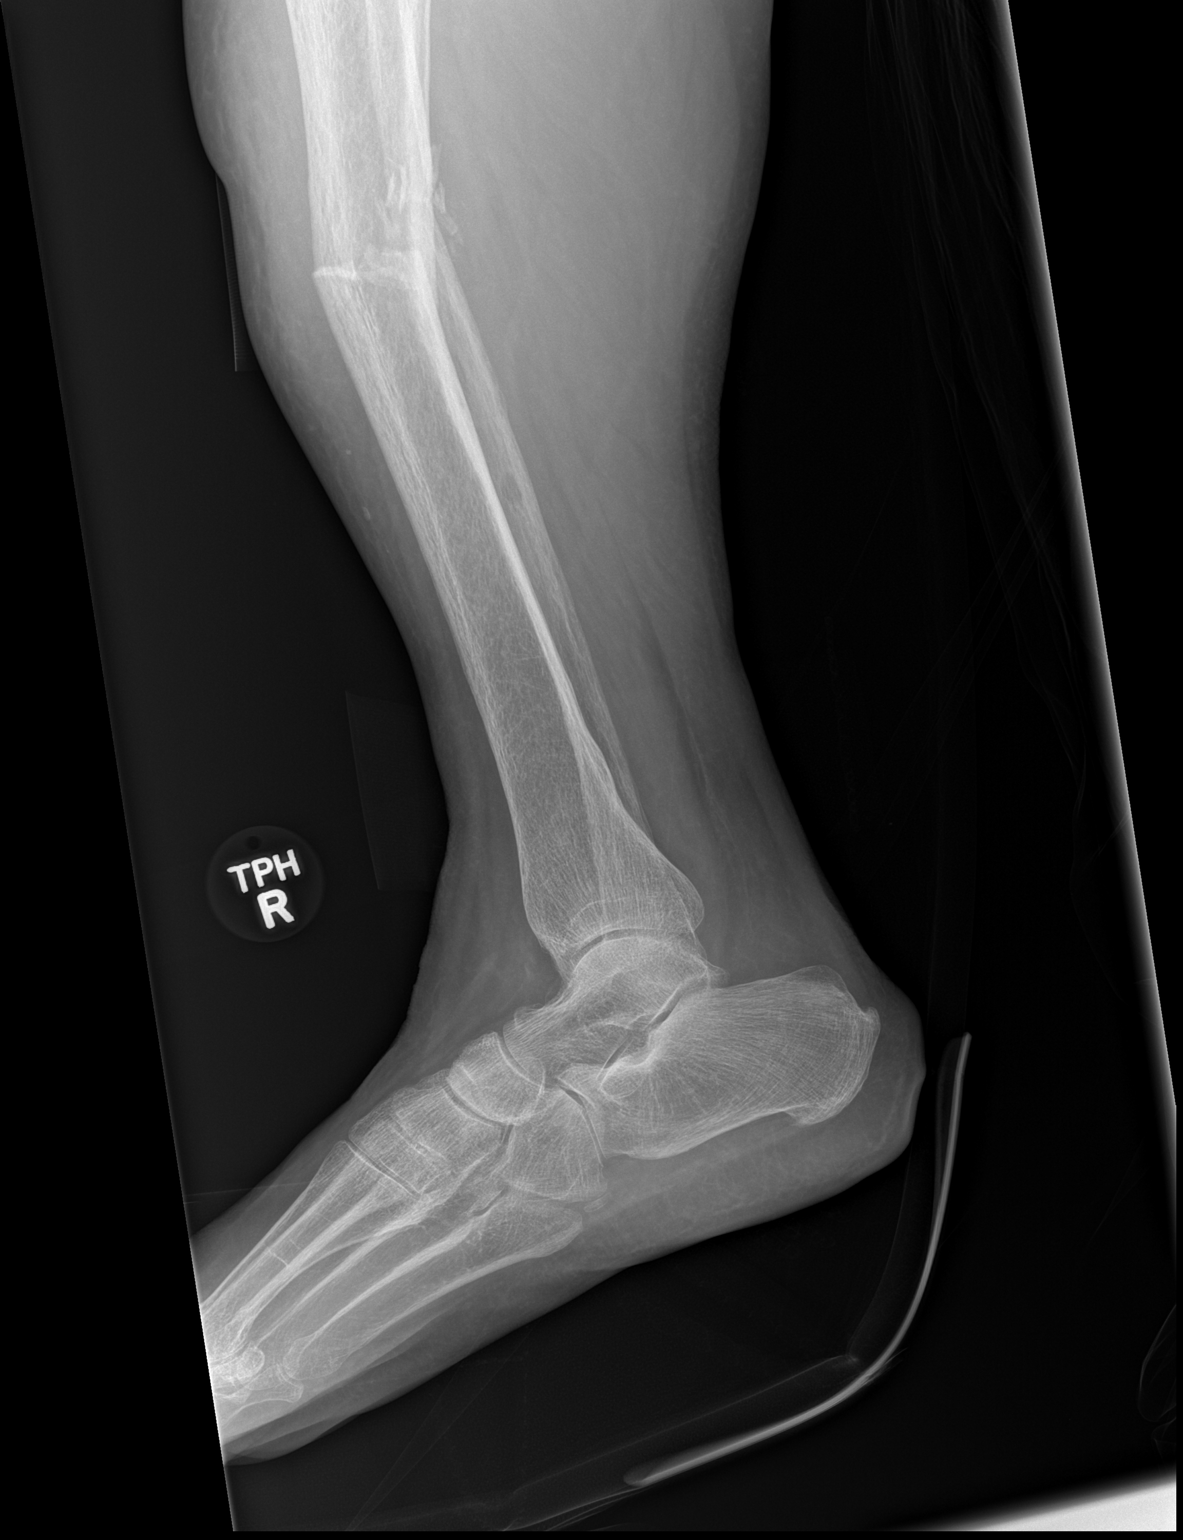

[x tib-fib lat right (2 of 3)]
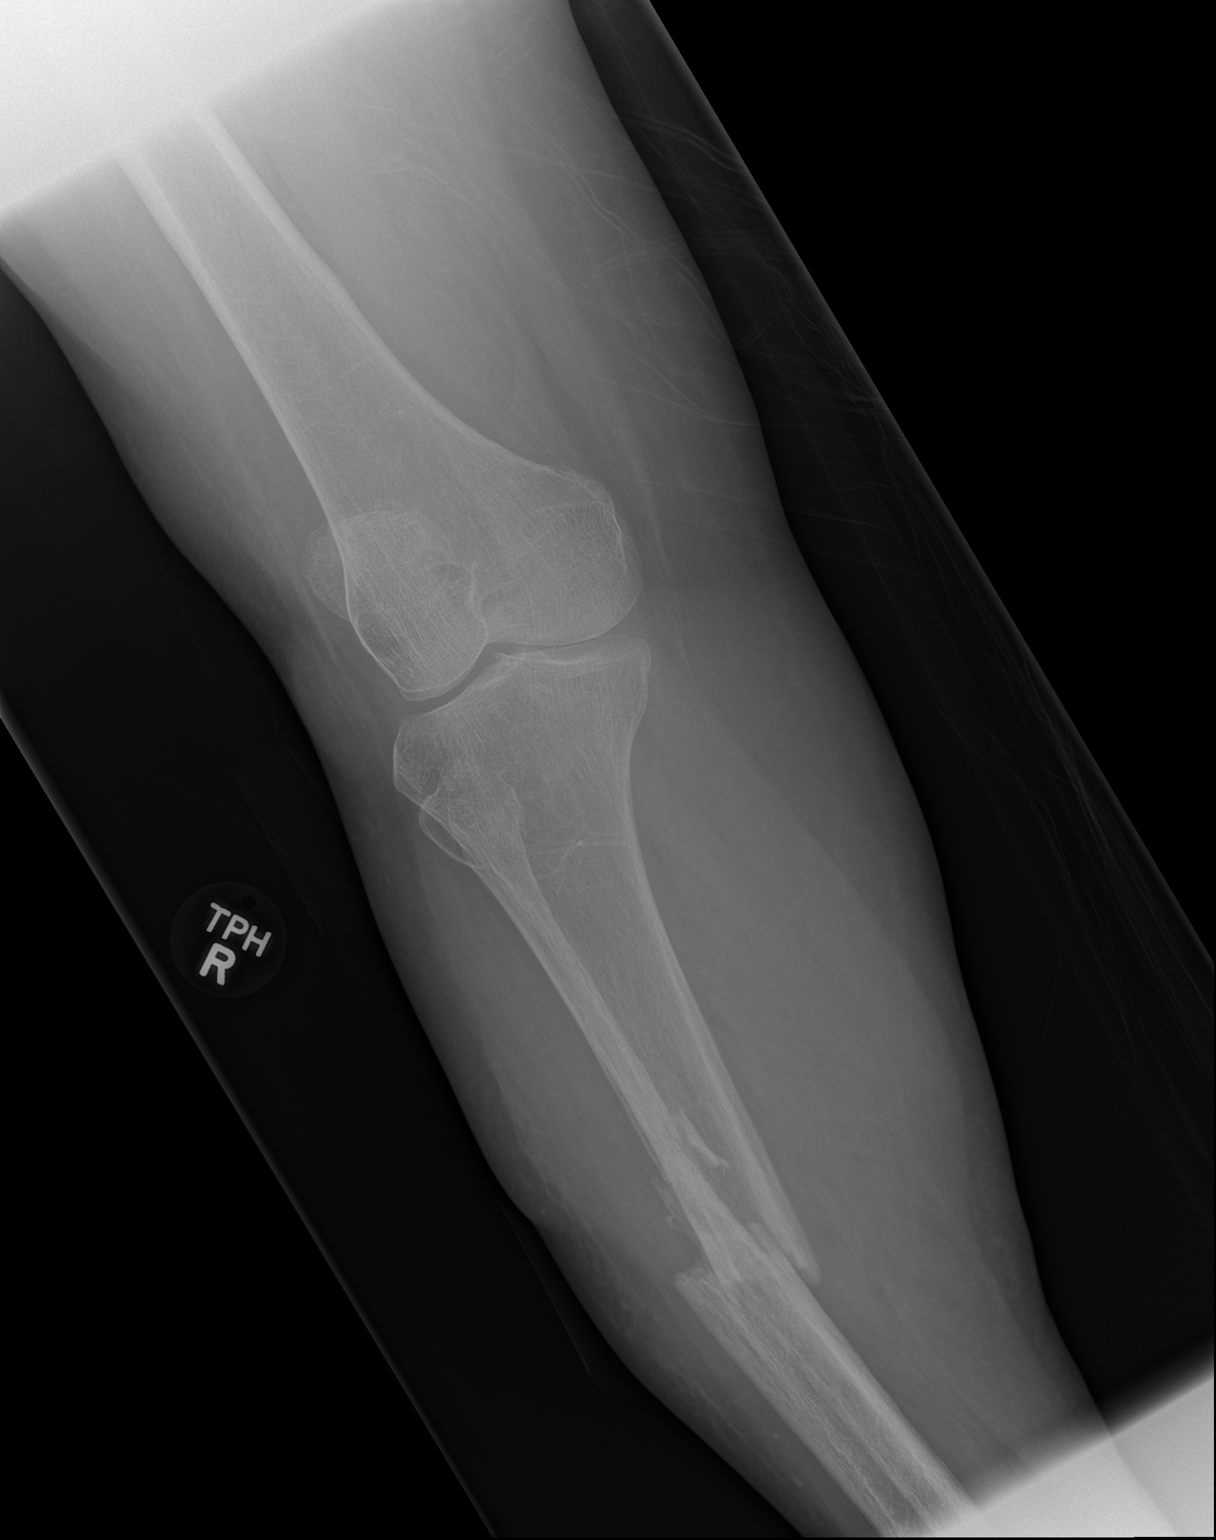

[x tib-fib ap right]
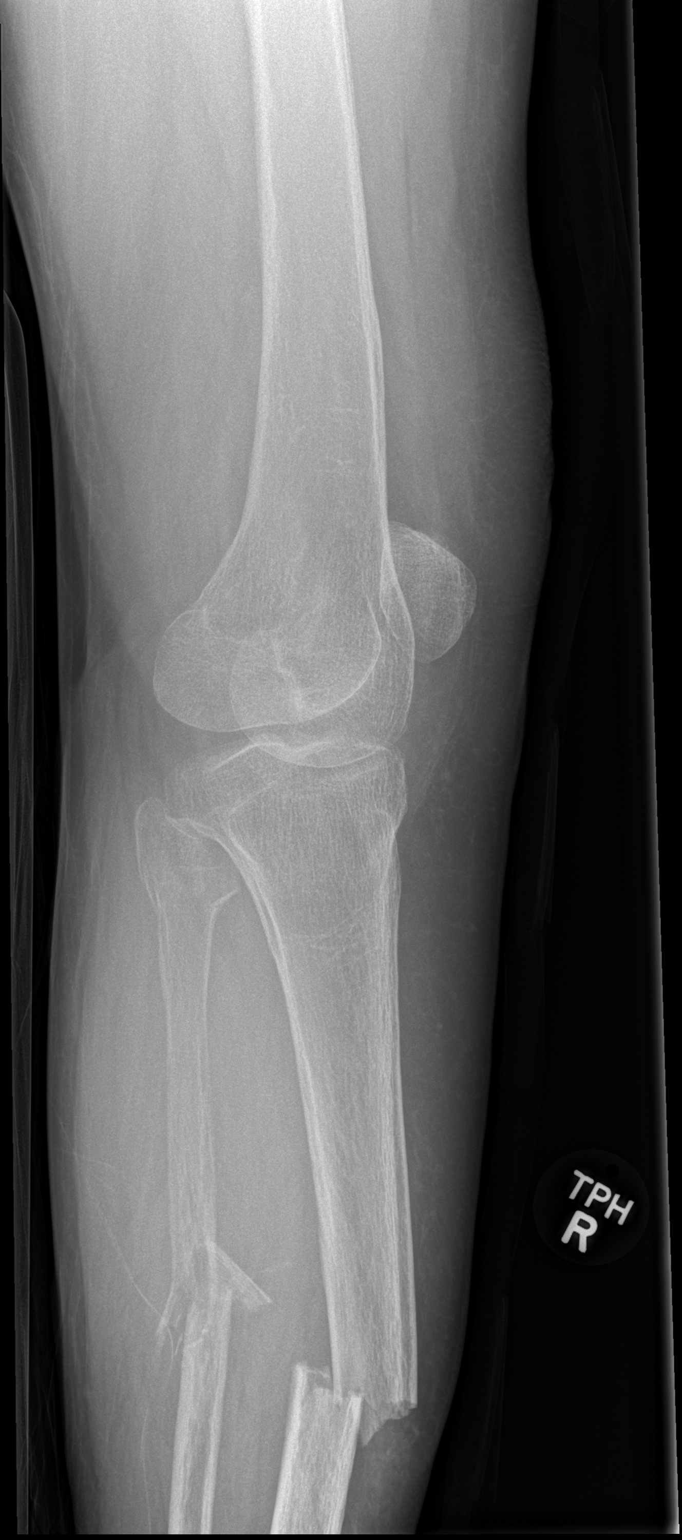

[x tib-fib lat right (3 of 3)]
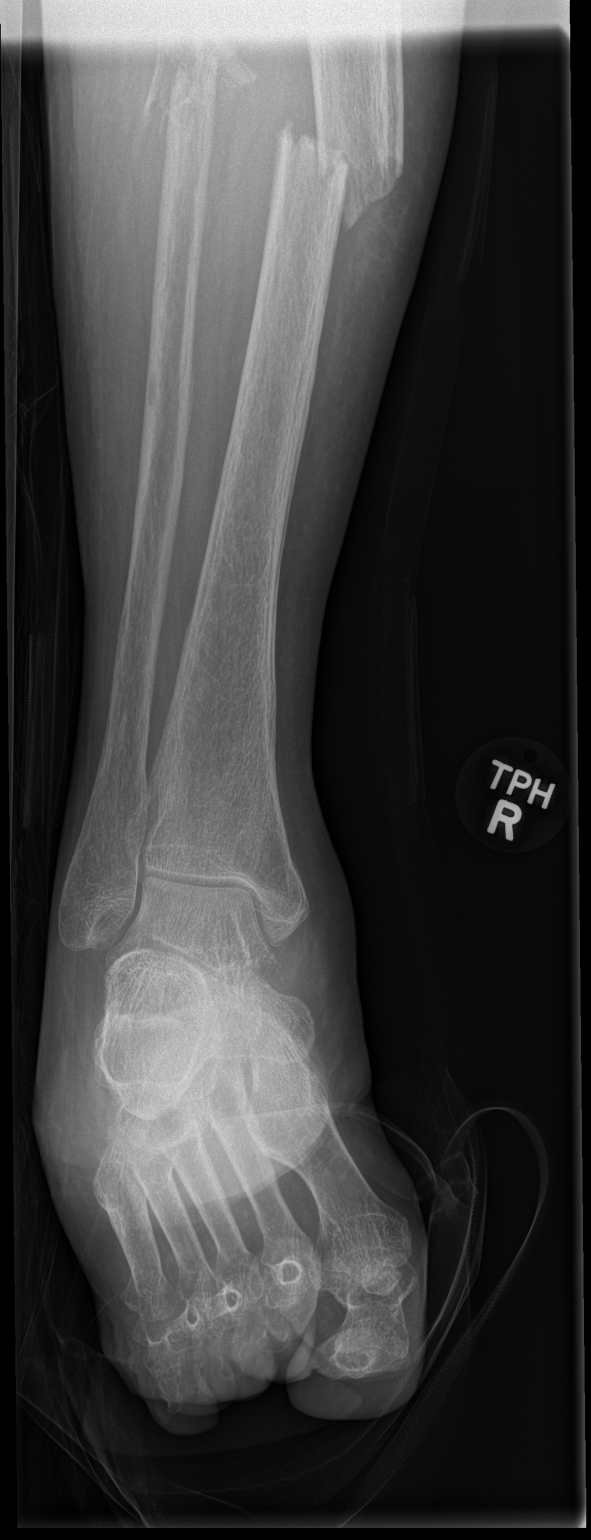

[4 of 4 positions shown; findings below may reference images not displayed]

FINDINGS: There is diffuse osteopenia. There are displaced transverse
fractures of the mid tibial and fibular diaphyseal regions. There is
mild lateral in posterior angulation of the distal fragments. There
is also a suggestion of a nondisplaced fracture the base of the
fibular head. There is a well-defined ovoid lucency over the lateral
cortex of the mid to distal fibular diaphysis.
IMPRESSION: Displaced transverse fractures of the mid to distal diaphysis of the
tibia and fibula. Nondisplaced fracture of the base of the fibular
head.

Ovoid lucency over the lateral cortex of the mid to distal fibular
diaphysis. Cannot exclude metastatic disease or multiple myeloma.
Recommend clinical correlation.

## 2013-07-18 IMAGING — CR DG CHEST 1V
1 series · 1 of 1 positions shown · non-contrast
Comparison: [DATE]

CLINICAL DATA: Post fall with shoulder pain.

EXAM:
CHEST - 1 VIEW

[t chest supine]
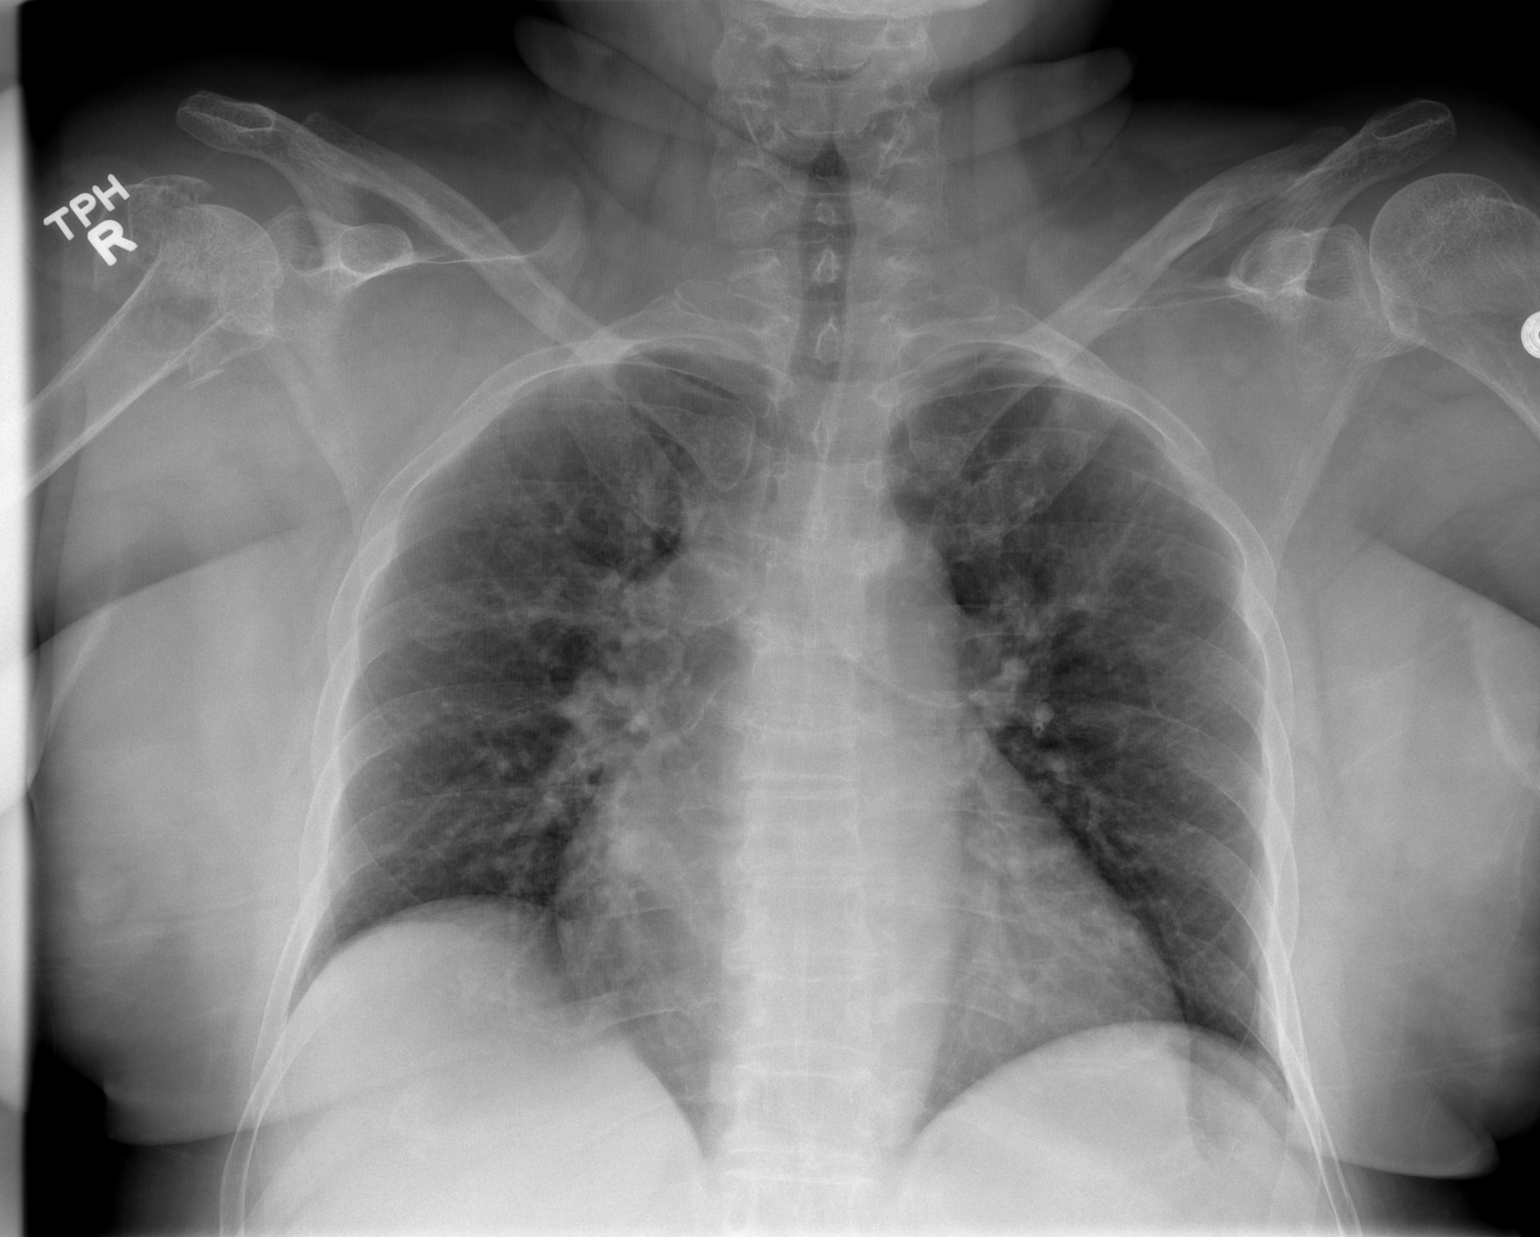

[1 of 1 positions shown; findings below may reference images not displayed]

FINDINGS: Lungs are clear. There is mild stable cardiomegaly. There is a
displaced somewhat comminuted right humeral head/ neck fracture.
IMPRESSION: No acute cardiopulmonary disease. Right humeral head/ neck fracture.

## 2013-07-18 IMAGING — CR DG HUMERUS 2V *R*
2 series · 2 of 2 positions shown · non-contrast
Comparison: None.

CLINICAL DATA: Pain post fall.

EXAM:
RIGHT SHOULDER - 2+ VIEW; RIGHT HUMERUS - 2+ VIEW

[t humerus ap right (1 of 2)]
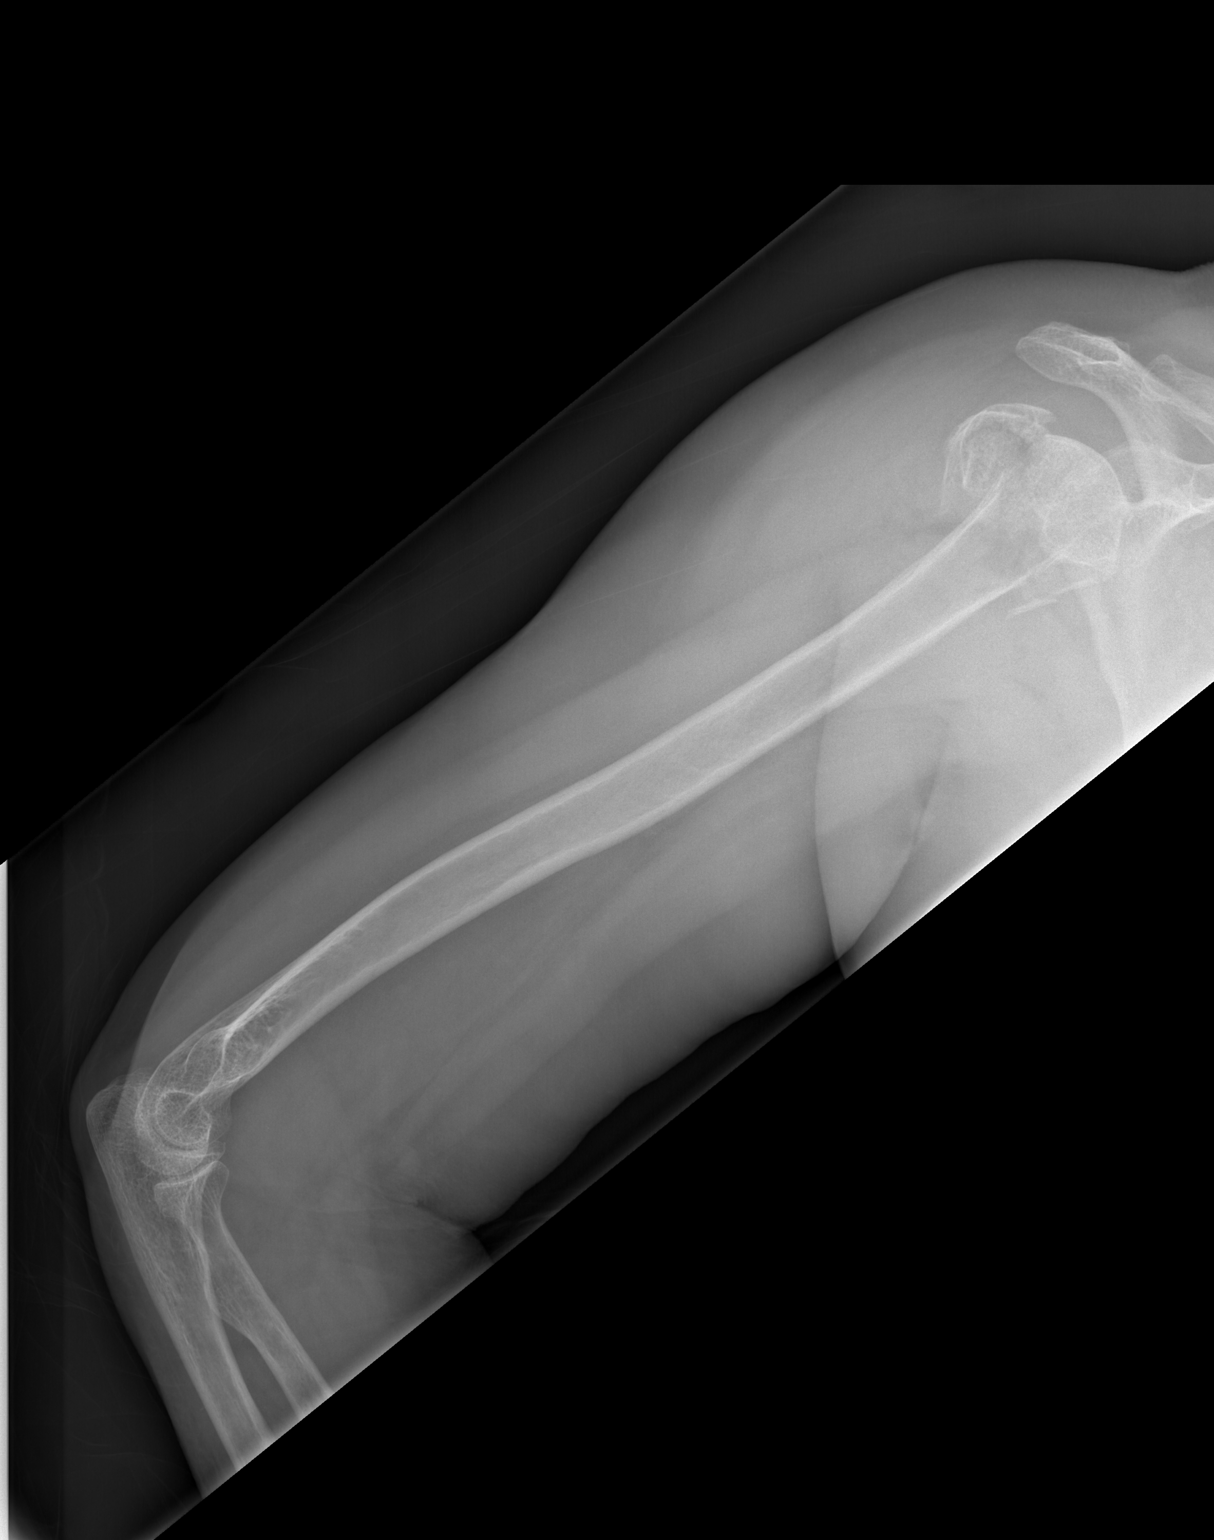

[t humerus ap right (2 of 2)]
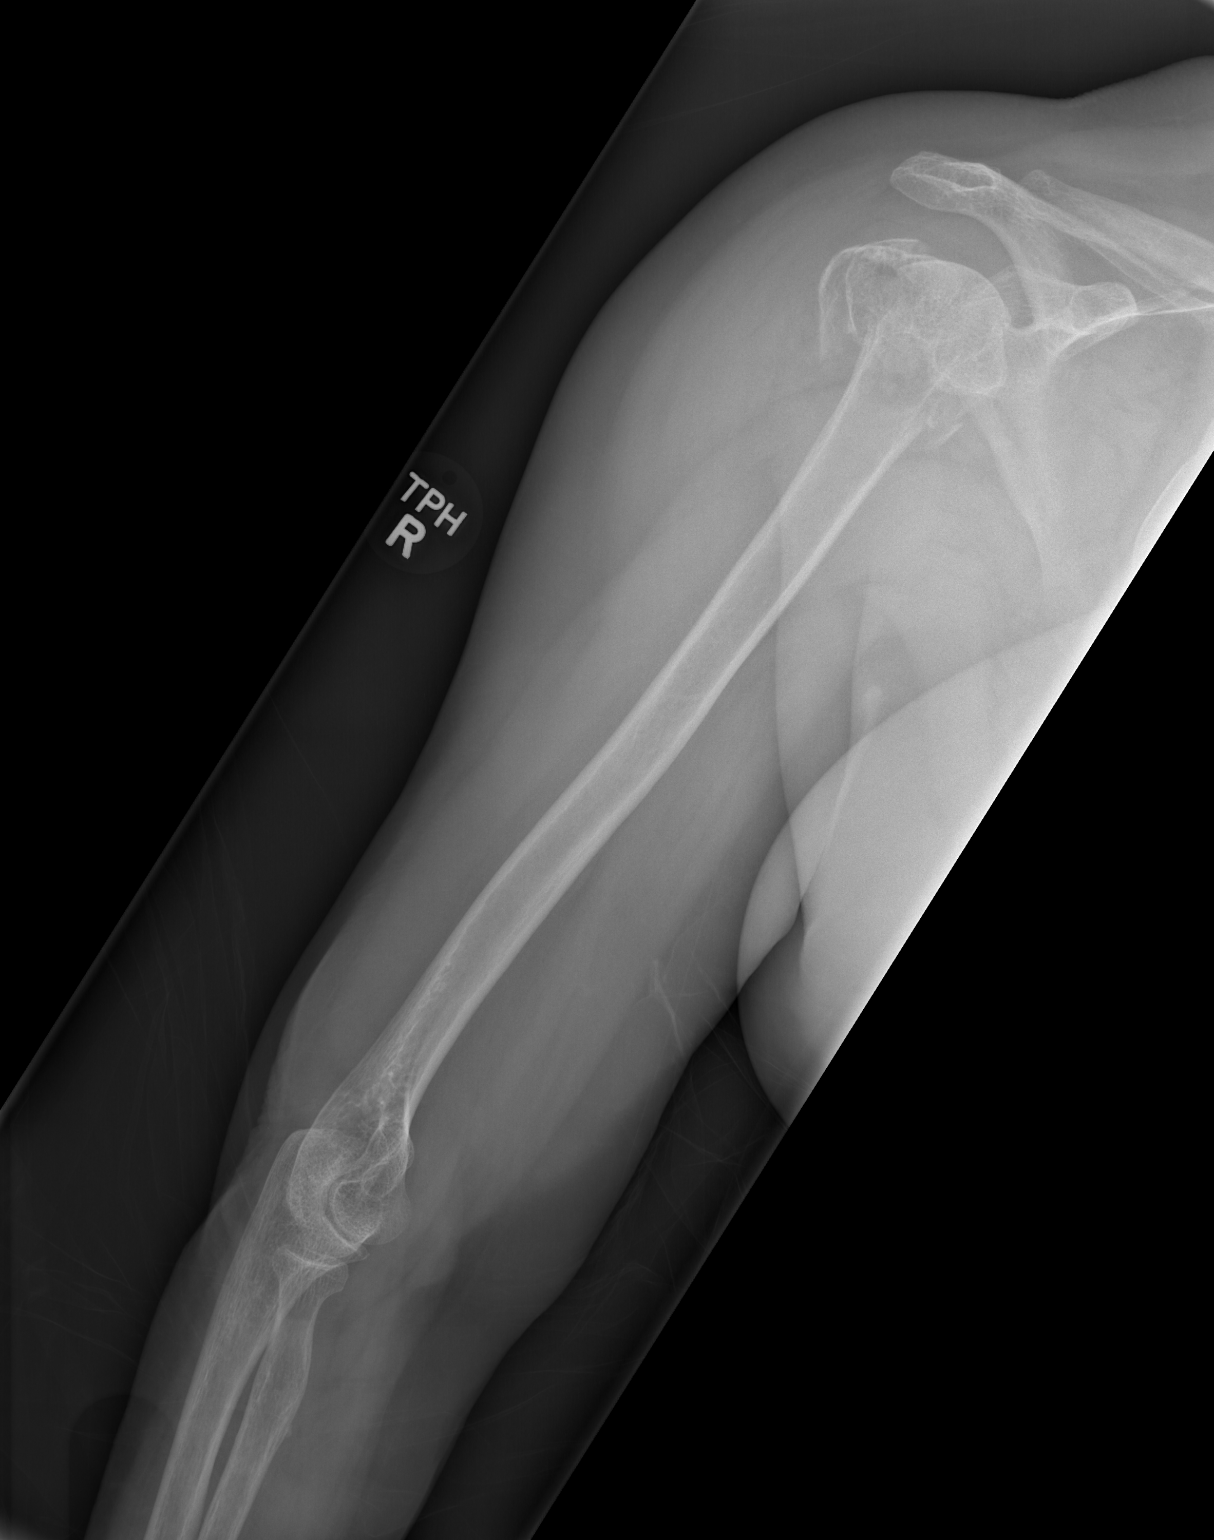

[2 of 2 positions shown; findings below may reference images not displayed]

FINDINGS: There is an acute displaced fracture of the right humeral head and
neck with prominent displaced greater tuberosity fragment. No
evidence of dislocation. Remainder the exam is unremarkable.
IMPRESSION: Displaced and comminuted fracture of the right humeral head/ neck
with prominent displaced greater tuberosity fragment.

## 2013-07-18 IMAGING — CR DG PELVIS 1-2V
2 series · 2 of 2 positions shown · non-contrast
Comparison: None.

CLINICAL DATA: Post fall.

EXAM:
PELVIS - 1-2 VIEW

[t pelvis ap (1 of 2)]
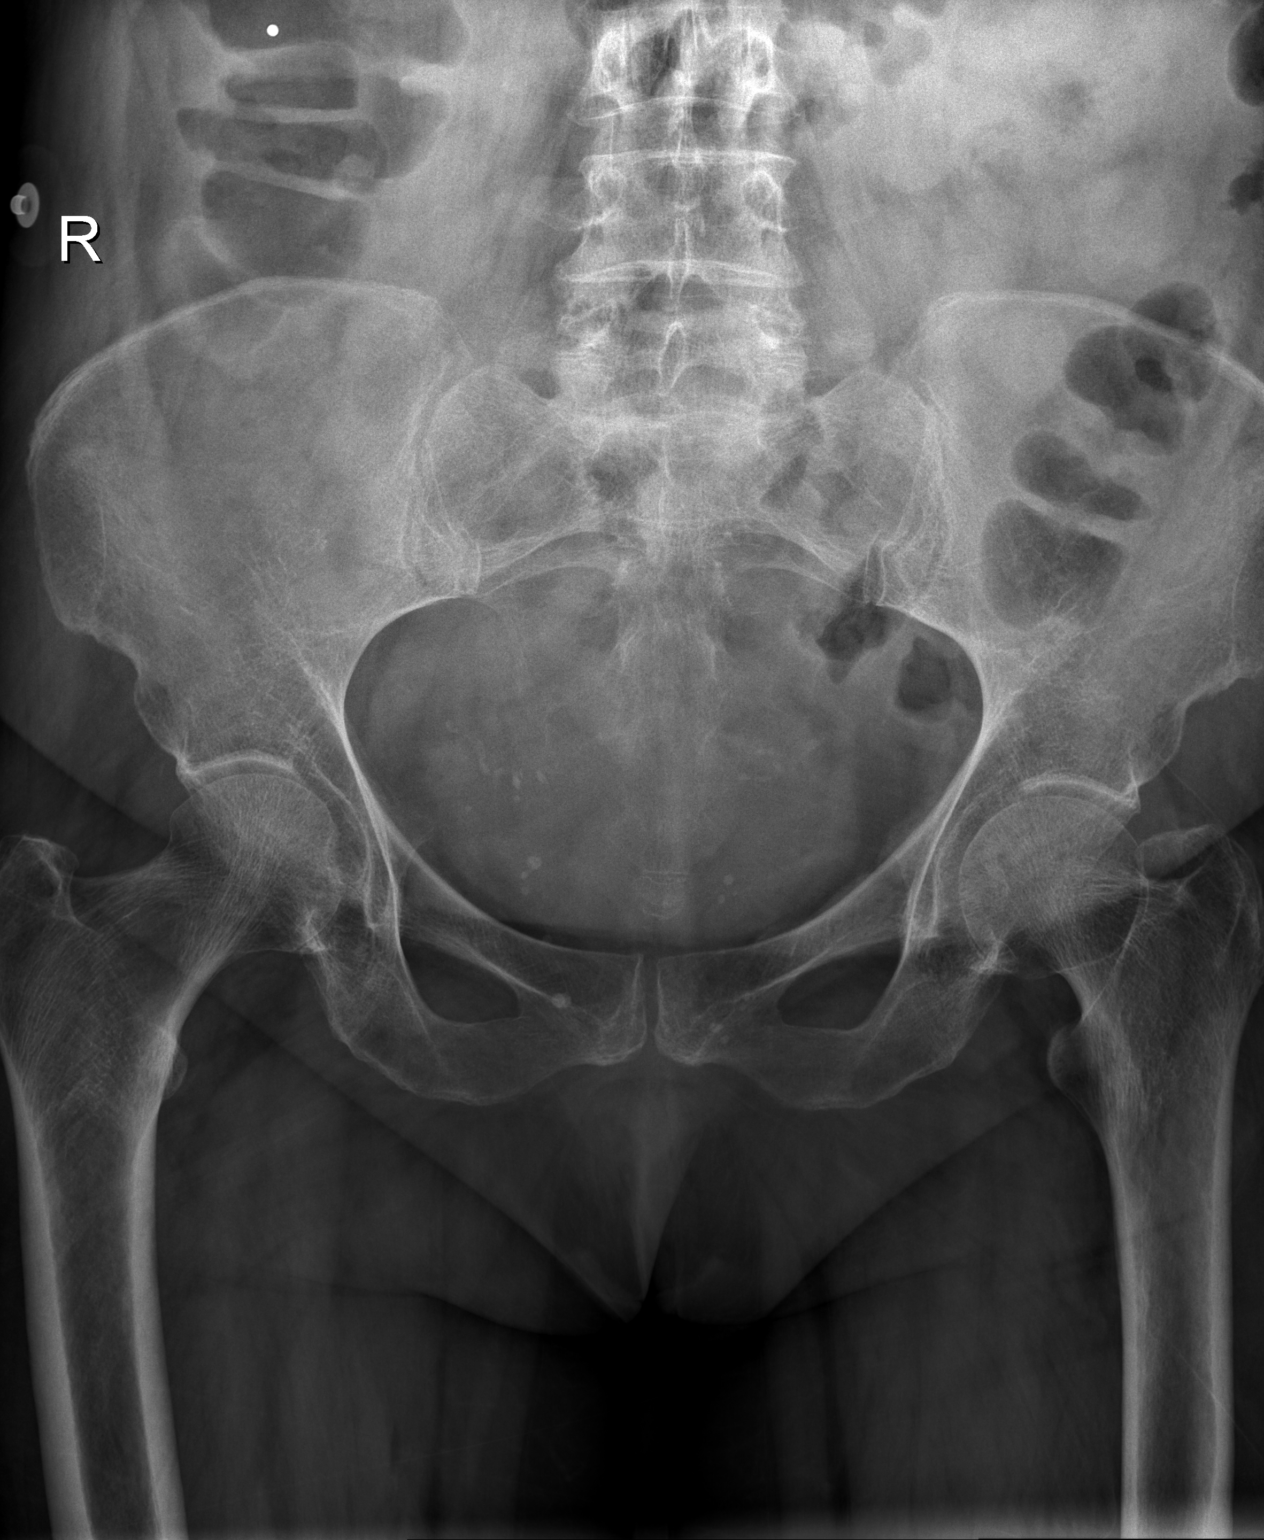

[t pelvis ap (2 of 2)]
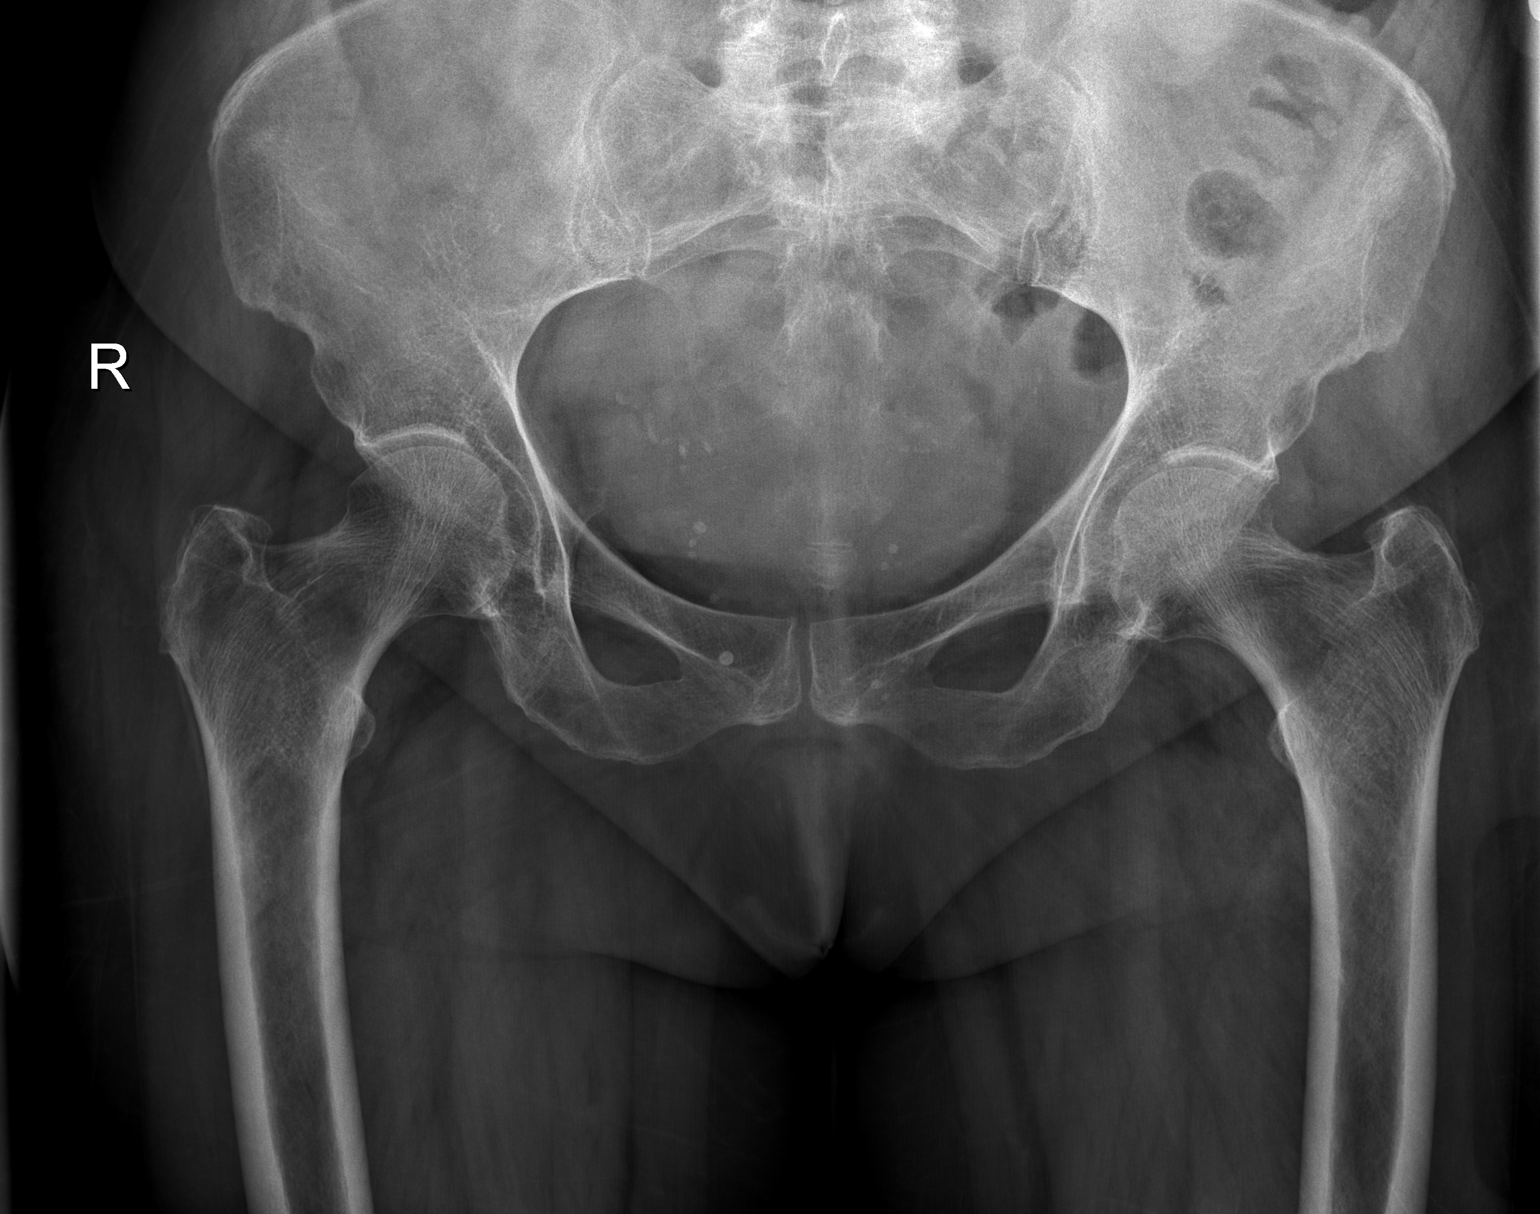

[2 of 2 positions shown; findings below may reference images not displayed]

FINDINGS: There is mild diffuse osteopenia. There are symmetric mild
degenerative changes of the hips. There is no definite acute
fracture or dislocation. There are mild degenerative changes of the
spine.
IMPRESSION: No acute fracture.

## 2013-07-18 IMAGING — CR DG SHOULDER 2+V*R*
2 series · 2 of 2 positions shown · non-contrast
Comparison: None.

CLINICAL DATA: Pain post fall.

EXAM:
RIGHT SHOULDER - 2+ VIEW; RIGHT HUMERUS - 2+ VIEW

[t shoulder external right]
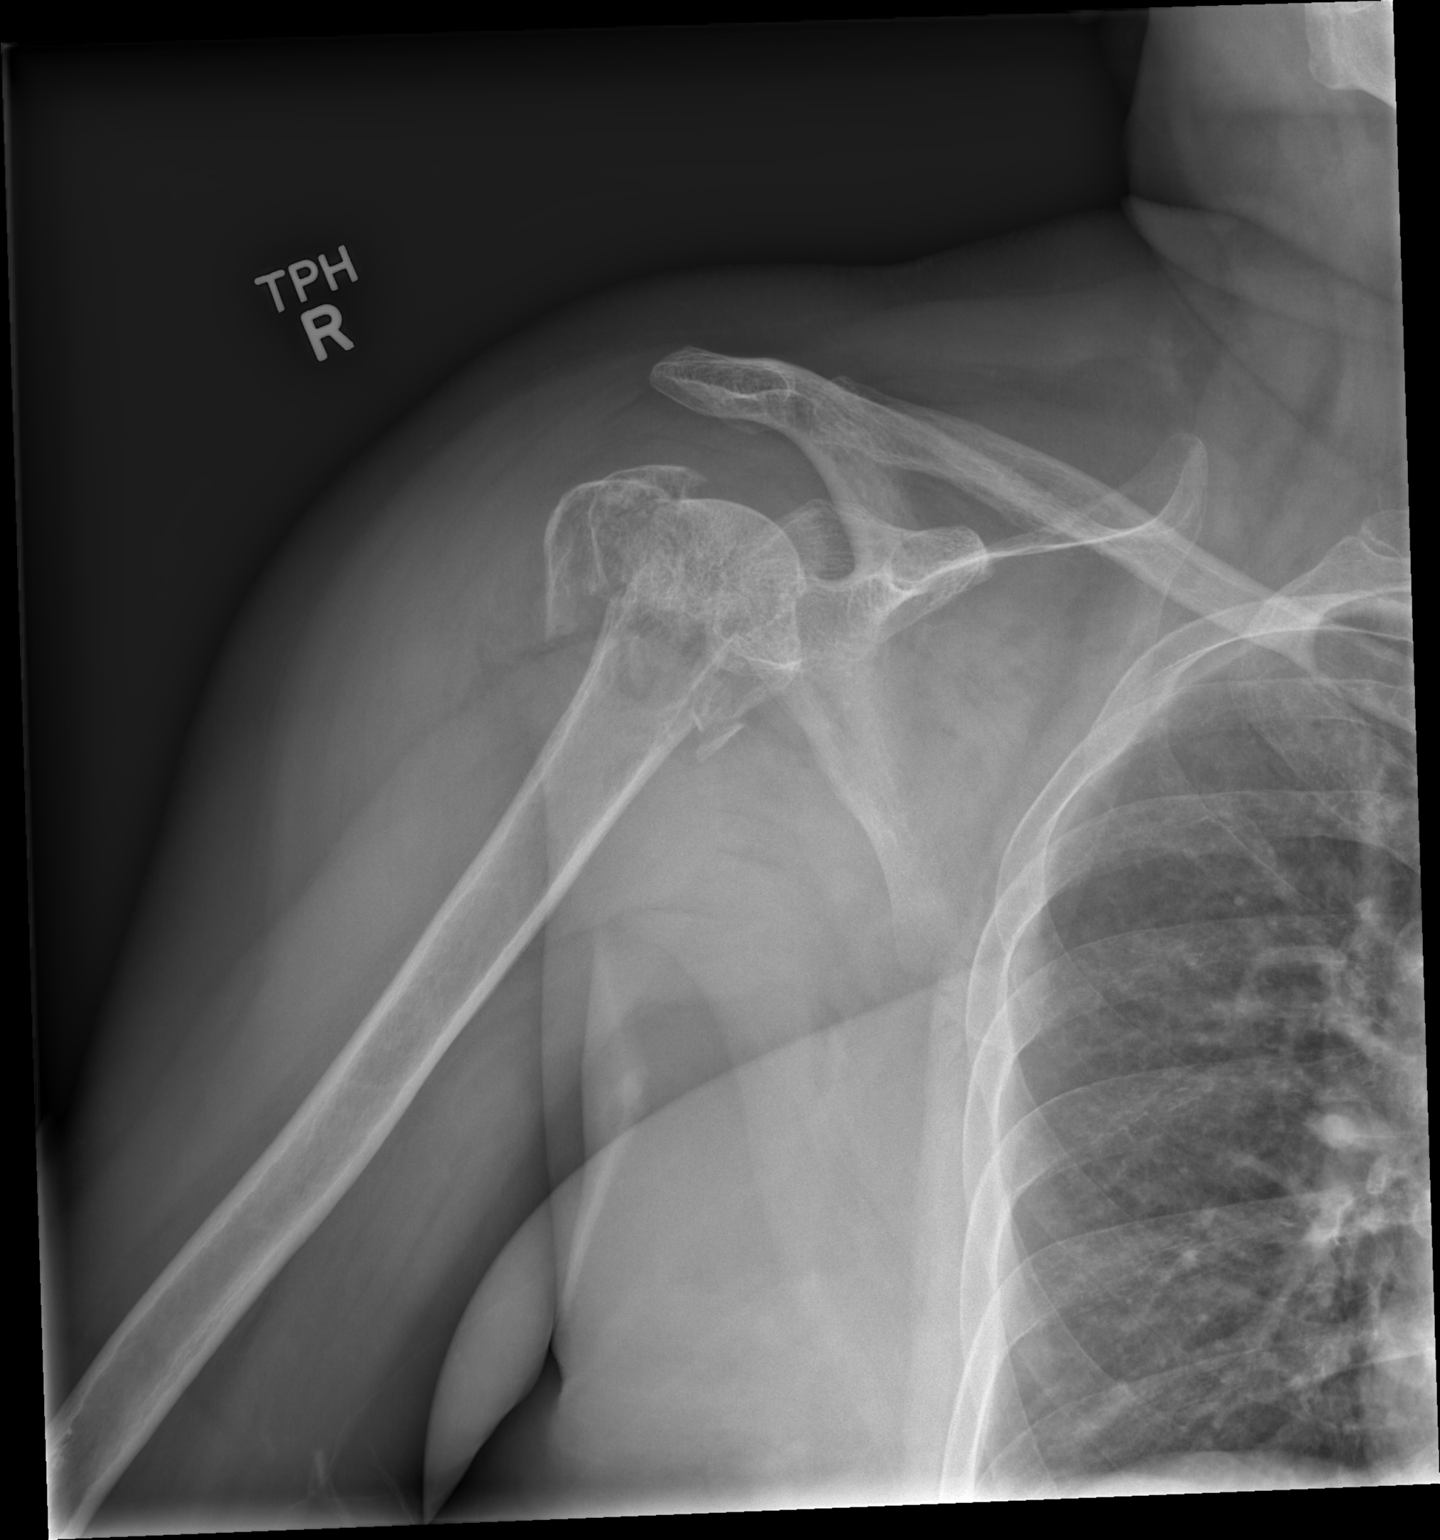

[t shoulder y-view right]
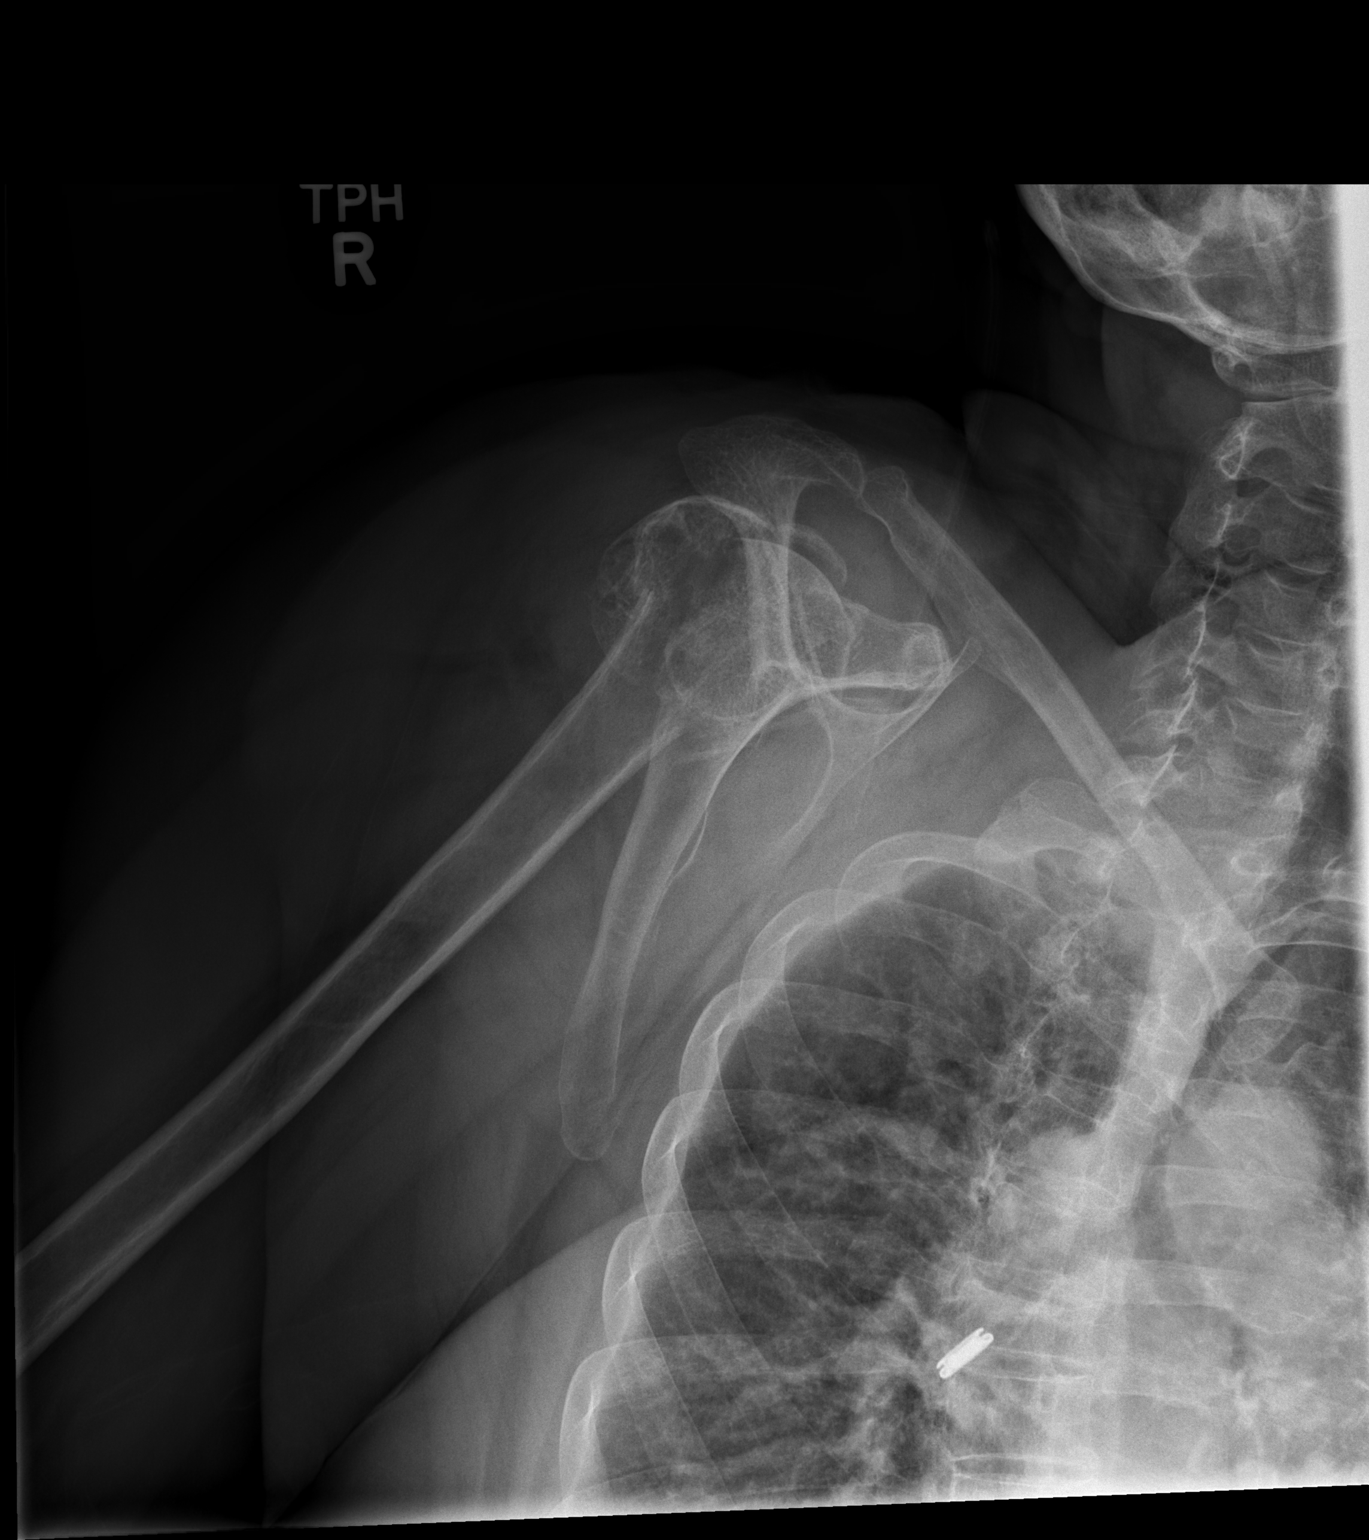

[2 of 2 positions shown; findings below may reference images not displayed]

FINDINGS: There is an acute displaced fracture of the right humeral head and
neck with prominent displaced greater tuberosity fragment. No
evidence of dislocation. Remainder the exam is unremarkable.
IMPRESSION: Displaced and comminuted fracture of the right humeral head/ neck
with prominent displaced greater tuberosity fragment.

## 2013-07-18 IMAGING — CR DG CERVICAL SPINE 2 OR 3 VIEWS
4 series · 4 of 4 positions shown · non-contrast
Comparison: None.

CLINICAL DATA: Fall.

EXAM:
CERVICAL SPINE - 2-3 VIEW

[t cervical spine ap]
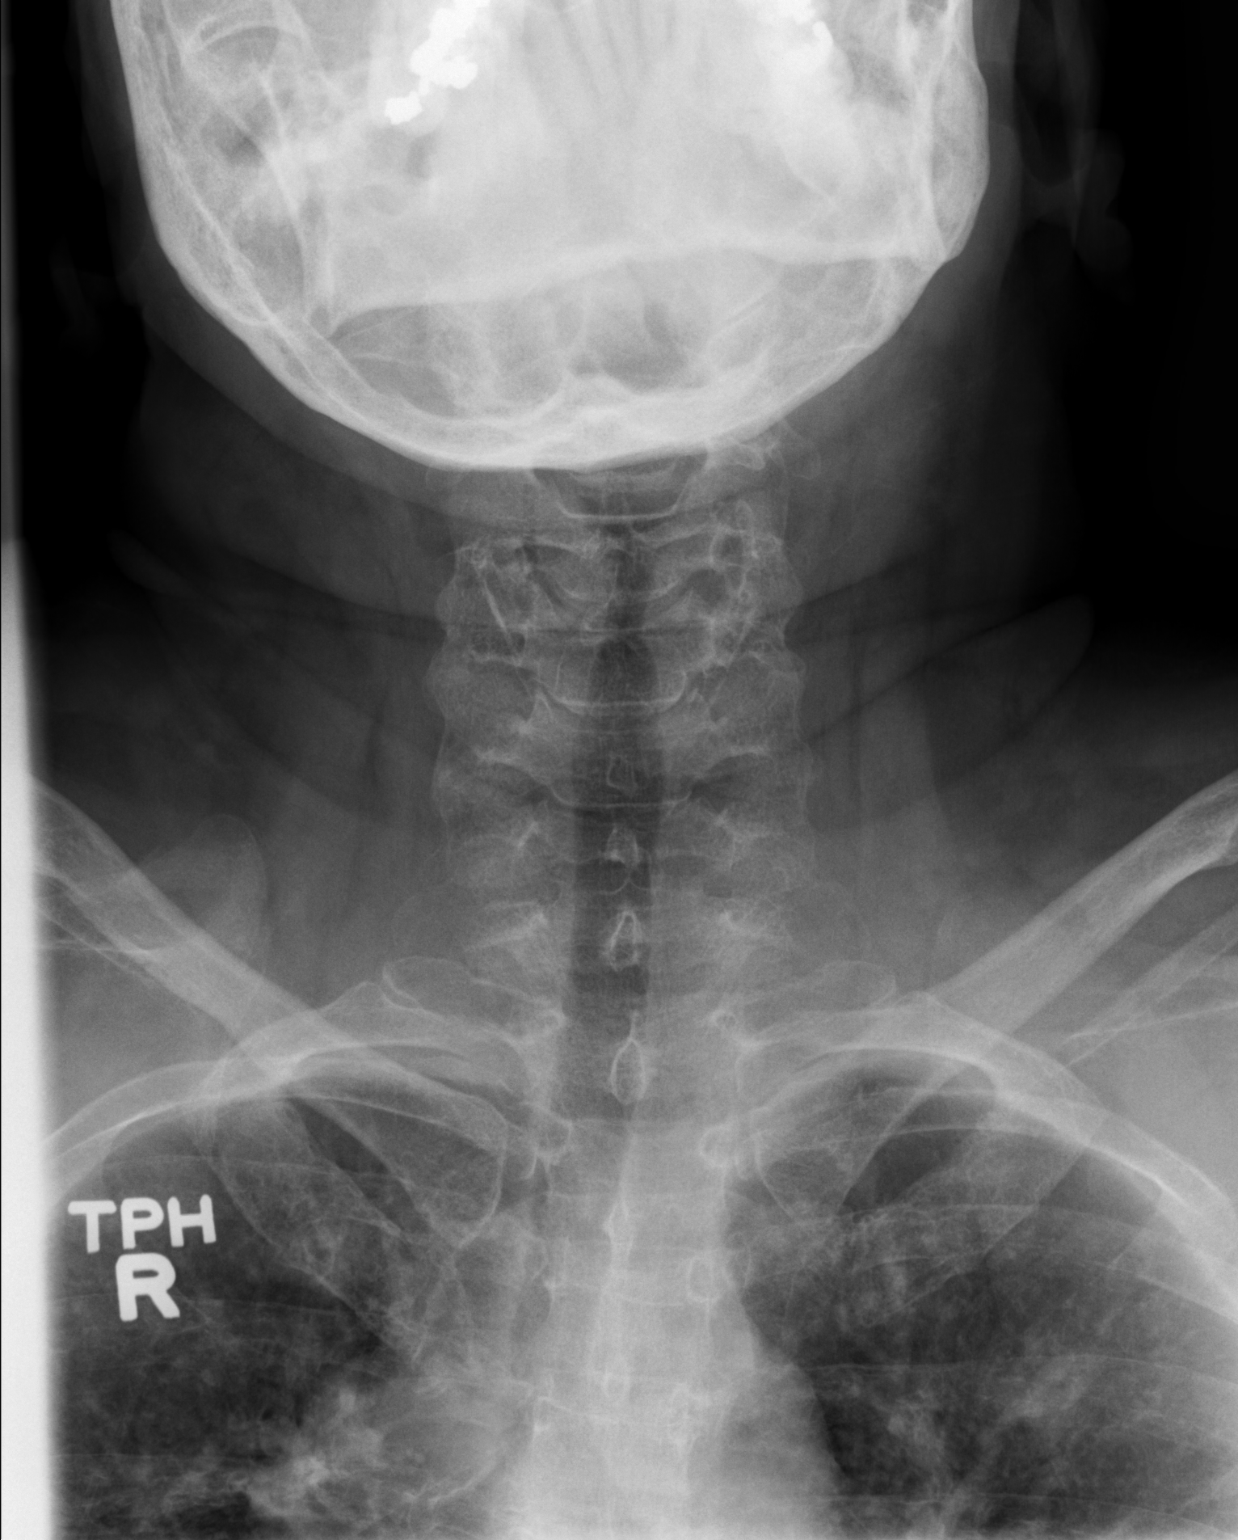

[t cervical spine odontoid]
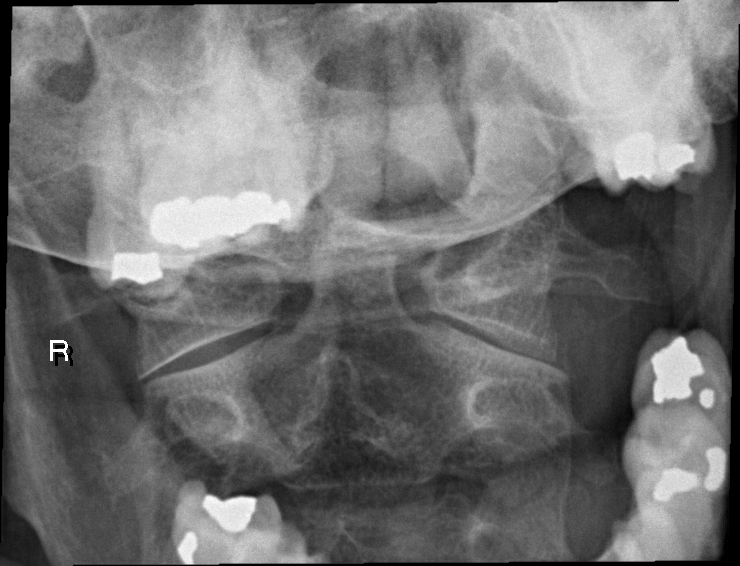

[w cervical spine lat]
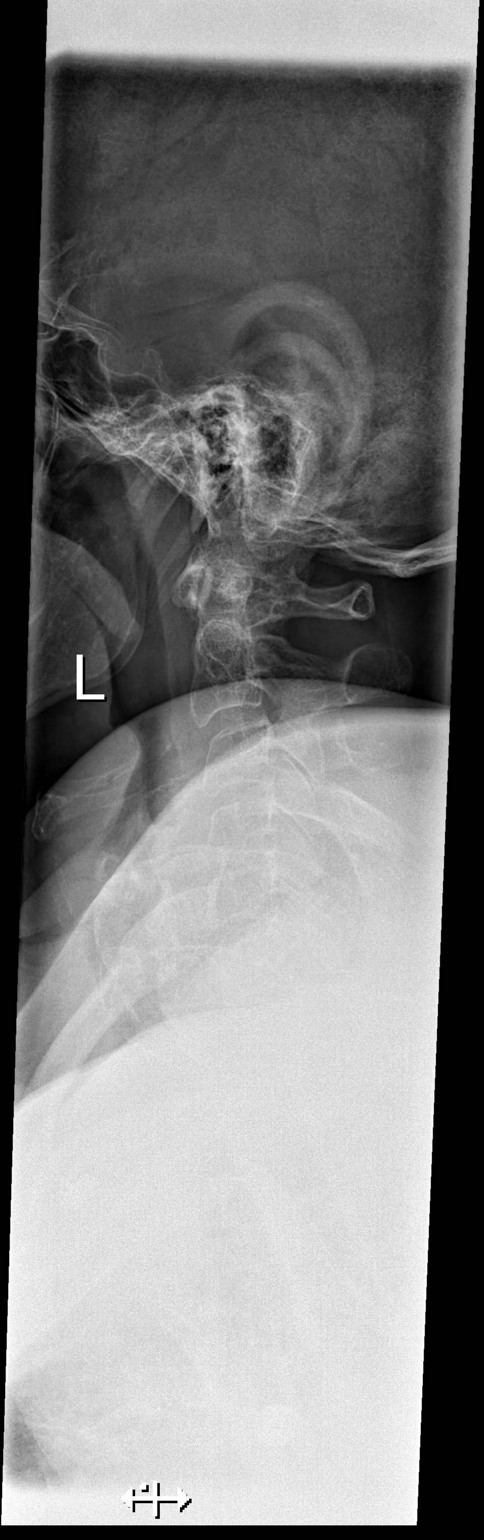

[w cervical swimmers]
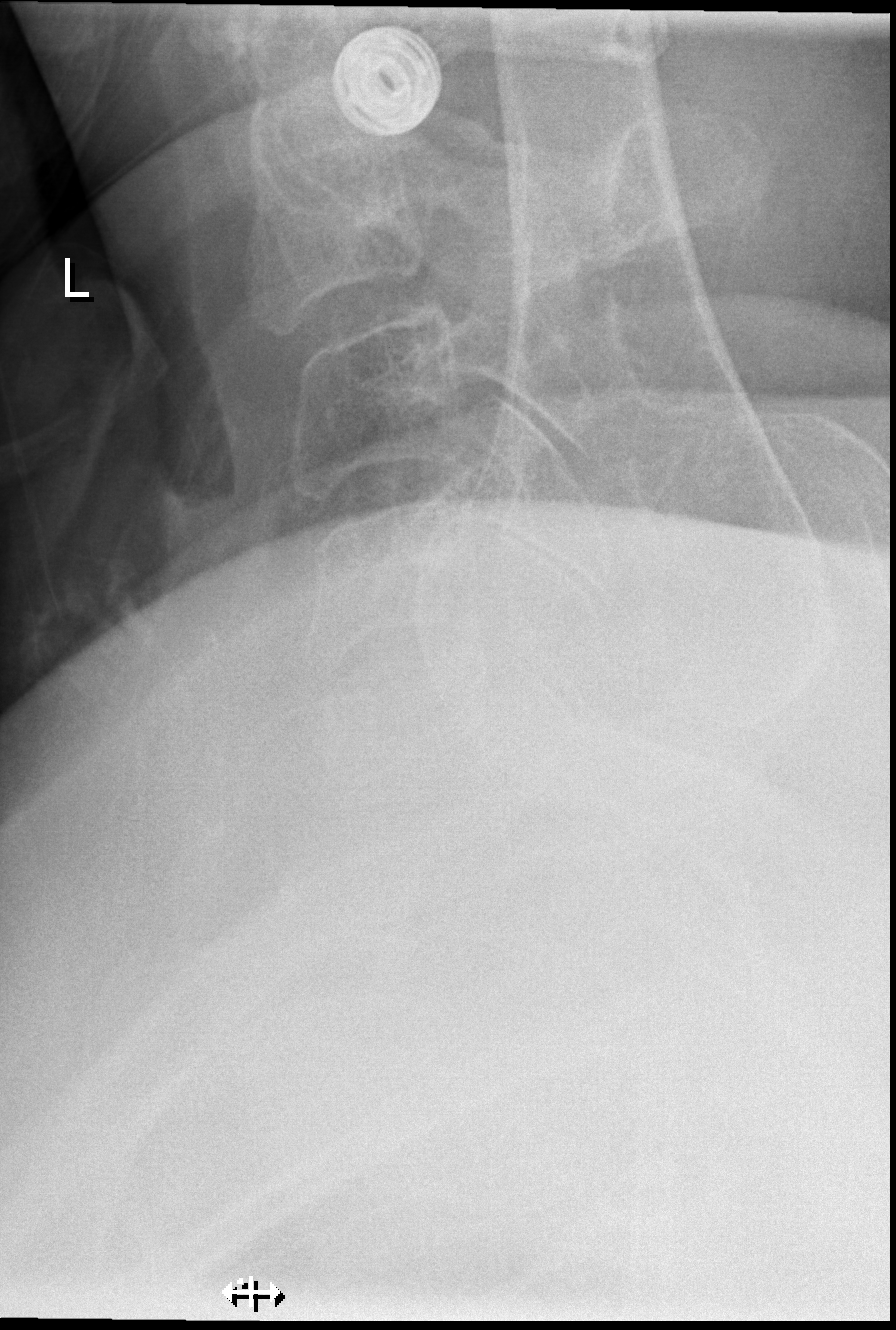

[4 of 4 positions shown; findings below may reference images not displayed]

FINDINGS: The cervical spine, particularly the lower cervical spine is poorly
imaged due to the overlying shoulders. CT of the cervical spine is
suggested for further evaluation.
IMPRESSION: Lower cervical spine inadequately imaged due to overlying shoulders.
CT of the cervical spine is suggested.

## 2013-07-18 MED ORDER — METHOCARBAMOL 100 MG/ML IJ SOLN
500.0000 mg | Freq: Four times a day (QID) | INTRAVENOUS | Status: DC | PRN
  Filled 2013-07-18: qty 5

## 2013-07-18 MED ORDER — FLEET ENEMA 7-19 GM/118ML RE ENEM: 1.0000 | ENEMA | Freq: Once | RECTAL | Status: AC | PRN

## 2013-07-18 MED ORDER — METOCLOPRAMIDE HCL 5 MG/ML IJ SOLN: 5.0000 mg | Freq: Three times a day (TID) | INTRAMUSCULAR | Status: DC | PRN

## 2013-07-18 MED ORDER — HYDROMORPHONE HCL 2 MG PO TABS
2.0000 mg | ORAL_TABLET | ORAL | Status: DC | PRN
  Administered 2013-07-19: 2 mg via ORAL
  Administered 2013-07-19 – 2013-07-21 (×6): 4 mg via ORAL
  Filled 2013-07-18 (×4): qty 2
  Filled 2013-07-18: qty 1
  Filled 2013-07-18 (×2): qty 2

## 2013-07-18 MED ORDER — ONDANSETRON HCL 4 MG/2ML IJ SOLN: 4.0000 mg | Freq: Once | INTRAMUSCULAR | Status: DC

## 2013-07-18 MED ORDER — SODIUM CHLORIDE 0.9 % IV BOLUS (SEPSIS)
1000.0000 mL | Freq: Once | INTRAVENOUS | Status: AC
Start: 2013-07-18 — End: 2013-07-18
  Administered 2013-07-18: 1000 mL via INTRAVENOUS

## 2013-07-18 MED ORDER — DOCUSATE SODIUM 100 MG PO CAPS
100.0000 mg | ORAL_CAPSULE | Freq: Two times a day (BID) | ORAL | Status: DC
  Administered 2013-07-19 – 2013-07-21 (×5): 100 mg via ORAL
  Filled 2013-07-18 (×8): qty 1

## 2013-07-18 MED ORDER — ONDANSETRON HCL 4 MG PO TABS: 4.0000 mg | ORAL_TABLET | Freq: Four times a day (QID) | ORAL | Status: DC | PRN

## 2013-07-18 MED ORDER — ONDANSETRON HCL 4 MG/2ML IJ SOLN: 4.0000 mg | Freq: Four times a day (QID) | INTRAMUSCULAR | Status: DC | PRN

## 2013-07-18 MED ORDER — POLYETHYLENE GLYCOL 3350 17 G PO PACK: 17.0000 g | PACK | Freq: Every day | ORAL | Status: DC | PRN

## 2013-07-18 MED ORDER — HYDROMORPHONE HCL PF 1 MG/ML IJ SOLN
1.0000 mg | Freq: Once | INTRAMUSCULAR | Status: AC
  Administered 2013-07-18: 1 mg via INTRAVENOUS
  Filled 2013-07-18: qty 1

## 2013-07-18 MED ORDER — METHOCARBAMOL 500 MG PO TABS
500.0000 mg | ORAL_TABLET | Freq: Four times a day (QID) | ORAL | Status: DC | PRN
  Administered 2013-07-19 – 2013-07-22 (×5): 500 mg via ORAL
  Filled 2013-07-18 (×4): qty 1

## 2013-07-18 MED ORDER — MORPHINE SULFATE 2 MG/ML IJ SOLN
1.0000 mg | INTRAMUSCULAR | Status: DC | PRN
  Administered 2013-07-19: 2 mg via INTRAVENOUS
  Filled 2013-07-18: qty 1

## 2013-07-18 MED ORDER — METOCLOPRAMIDE HCL 10 MG PO TABS: 5.0000 mg | ORAL_TABLET | Freq: Three times a day (TID) | ORAL | Status: DC | PRN

## 2013-07-18 MED ORDER — BISACODYL 10 MG RE SUPP: 10.0000 mg | Freq: Every day | RECTAL | Status: DC | PRN

## 2013-07-18 MED ORDER — SODIUM CHLORIDE 0.9 % IV SOLN
INTRAVENOUS | Status: DC
  Administered 2013-07-19 (×3): via INTRAVENOUS

## 2013-07-18 NOTE — ED Provider Notes (Signed)
CSN: [id number]     Arrival date & time 07/18/13  1804 History   First MD Initiated Contact with Patient 07/18/13 1807     Chief Complaint  Patient presents with  . Leg Pain    right leg  . Shoulder Pain    right shoulder   (Consider location/radiation/quality/duration/timing/severity/associated sxs/prior Treatment) The history is provided by the patient.  LAINE FONNER is a 63 y.o. female hx of HTN, here with fall. She missed a step and fell backwards and landed on the right shoulder as well as right leg. Denies any head injury. EMS noted that there was an obvious right lower leg deformity and she was placed in a splint. She was given 100 mcg of fentanyl by EMS. Denies any loss of consciousness or syncope.    Past Medical History  Diagnosis Date  . Hypertension   . Thyroid disease     S/p Radiation  . [place] 03/16/2010   History reviewed. No pertinent past surgical history. No family history on file. History  Substance Use Topics  . Smoking status: Current Every Day Smoker -- 1.00 packs/day  . Smokeless tobacco: Not on file  . Alcohol Use:    OB History   Grav Para Term Preterm Abortions TAB SAB Ect Mult Living                 Review of Systems  Musculoskeletal:       R shoulder and R leg pain   All other systems reviewed and are negative.    Allergies  Oxycodone hcl  Home Medications   Current Outpatient Rx  Name  Route  Sig  Dispense  Refill  . atorvastatin (LIPITOR) 20 MG tablet   Oral   Take 20 mg by mouth at bedtime.         . B Complex-C (B-COMPLEX WITH VITAMIN C) tablet   Oral   Take 1 tablet by mouth daily.         . calcium-vitamin D (OSCAL WITH D) 500-200 MG-UNIT per tablet   Oral   Take 1 tablet by mouth.         . citalopram (CELEXA) 20 MG tablet   Oral   Take 20 mg by mouth daily.         . Flaxseed, Linseed, (FLAX SEEDS PO)   Oral   Take 1 tablet by mouth daily.         Marland Kitchen levothyroxine (SYNTHROID, LEVOTHROID) 125  MCG tablet   Oral   Take 125 mcg by mouth at bedtime.         Marland Kitchen lisinopril-hydrochlorothiazide (PRINZIDE,ZESTORETIC) 20-25 MG per tablet   Oral   Take 1 tablet by mouth daily.         . Melatonin 5 MG CAPS   Oral   Take 1 capsule by mouth at bedtime as needed.         . Omega-3 Fatty Acids (FISH OIL) 300 MG CAPS   Oral   Take 1 capsule by mouth daily.          BP 148/67  Pulse 44  Temp(Src) 98.1 F (36.7 C) (Oral)  Resp 16  Ht _0  (1.6 m)  Wt 189 lb (85.73 kg)  BMI 33.49 kg/m2  SpO2 96% Physical Exam  Nursing note and vitals reviewed. Constitutional: She is oriented to person, place, and time.  Uncomfortable, boarded, collared   HENT:  Head: Normocephalic and atraumatic.  Mouth/Throat: Oropharynx is clear and moist.  Eyes: Conjunctivae are normal. Pupils are equal, round, and reactive to light.  Neck: Normal range of motion. Neck supple.  No midline tenderness   Cardiovascular: Normal rate, regular rhythm and normal heart sounds.   Pulmonary/Chest: Effort normal and breath sounds normal. No respiratory distress. She has no wheezes. She has no rales.  Abdominal: Soft. Bowel sounds are normal. She exhibits no distension. There is no tenderness. There is no rebound.  Musculoskeletal:  R tibia with obvious deformity. No open fracture. Diminished R DP pulse. No knee or hip tenderness. Unable to range R shoulder. Also tenderness R shoulder and proximal humerus. 2+ radial pulse and nl ROM elbow and forearm and wrist   Neurological: She is alert and oriented to person, place, and time. No cranial nerve deficit. Coordination normal.  Skin: Skin is warm and dry.  Psychiatric: She has a normal mood and affect. Her behavior is normal. Judgment and thought content normal.    ED Course  Procedures (including critical care time) Labs Review Labs Reviewed  CBC WITH DIFFERENTIAL - Abnormal; Notable for the following:    RBC 3.69 (*)    Hemoglobin 11.4 (*)    HCT 30.8 (*)     MCHC 37.0 (*)    Platelets 149 (*)    All other components within normal limits  COMPREHENSIVE METABOLIC PANEL - Abnormal; Notable for the following:    Sodium 135 (*)    Glucose, Bld 104 (*)    All other components within normal limits  PROTIME-INR  TYPE AND SCREEN   Imaging Review Dg Chest 1 View  07/18/2013   CLINICAL DATA:  Post fall with shoulder pain.  EXAM: CHEST - 1 VIEW  COMPARISON:  09/09/2011  FINDINGS: Lungs are clear. There is mild stable cardiomegaly. There is a displaced somewhat comminuted right humeral head/ neck fracture.  IMPRESSION: No acute cardiopulmonary disease. Right humeral head/ neck fracture.   Electronically Signed   By: Marin Olp M.D.   On: 07/18/2013 20:06   Dg Cervical Spine 2 Or 3 Views  07/18/2013   CLINICAL DATA:  Fall.  EXAM: CERVICAL SPINE - 2-3 VIEW  COMPARISON:  None.  FINDINGS: The cervical spine, particularly the lower cervical spine is poorly imaged due to the overlying shoulders. CT of the cervical spine is suggested for further evaluation.  IMPRESSION: Lower cervical spine inadequately imaged due to overlying shoulders. CT of the cervical spine is suggested.   Electronically Signed   By: Marcello Moores  Register   On: 07/18/2013 20:13   Dg Pelvis 1-2 Views  07/18/2013   CLINICAL DATA:  Post fall.  EXAM: PELVIS - 1-2 VIEW  COMPARISON:  None.  FINDINGS: There is mild diffuse osteopenia. There are symmetric mild degenerative changes of the hips. There is no definite acute fracture or dislocation. There are mild degenerative changes of the spine.  IMPRESSION: No acute fracture.   Electronically Signed   By: Marin Olp M.D.   On: 07/18/2013 20:10   Dg Shoulder Right  07/18/2013   CLINICAL DATA:  Pain post fall.  EXAM: RIGHT SHOULDER - 2+ VIEW; RIGHT HUMERUS - 2+ VIEW  COMPARISON:  None.  FINDINGS: There is an acute displaced fracture of the right humeral head and neck with prominent displaced greater tuberosity fragment. No evidence of dislocation.  Remainder the exam is unremarkable.  IMPRESSION: Displaced and comminuted fracture of the right humeral head/ neck with prominent displaced greater tuberosity fragment.   Electronically Signed   By: Marin Olp M.D.  On: 07/18/2013 20:08   Dg Tibia/fibula Right  07/18/2013   CLINICAL DATA:  Pain post fall.  EXAM: RIGHT TIBIA AND FIBULA - 2 VIEW  COMPARISON:  None.  FINDINGS: There is diffuse osteopenia. There are displaced transverse fractures of the mid tibial and fibular diaphyseal regions. There is mild lateral in posterior angulation of the distal fragments. There is also a suggestion of a nondisplaced fracture the base of the fibular head. There is a well-defined ovoid lucency over the lateral cortex of the mid to distal fibular diaphysis.  IMPRESSION: Displaced transverse fractures of the mid to distal diaphysis of the tibia and fibula. Nondisplaced fracture of the base of the fibular head.  Ovoid lucency over the lateral cortex of the mid to distal fibular diaphysis. Cannot exclude metastatic disease or multiple myeloma. Recommend clinical correlation.   Electronically Signed   By: Marin Olp M.D.   On: 07/18/2013 20:15   Dg Humerus Right  07/18/2013   CLINICAL DATA:  Pain post fall.  EXAM: RIGHT SHOULDER - 2+ VIEW; RIGHT HUMERUS - 2+ VIEW  COMPARISON:  None.  FINDINGS: There is an acute displaced fracture of the right humeral head and neck with prominent displaced greater tuberosity fragment. No evidence of dislocation. Remainder the exam is unremarkable.  IMPRESSION: Displaced and comminuted fracture of the right humeral head/ neck with prominent displaced greater tuberosity fragment.   Electronically Signed   By: Marin Olp M.D.   On: 07/18/2013 20:08    EKG Interpretation    Date/Time:  Saturday July 18 2013 18:59:27 EST Ventricular Rate:  72 PR Interval:  200 QRS Duration: 88 QT Interval:  479 QTC Calculation: 524 R Axis:   75 Text Interpretation:  sinus bradycardia  No  significant change since last tracing Confirmed by Rosalba Totty  MD, Teancum Brule [phone]) on 07/18/2013 7:05:38 PM            MDM  No diagnosis found. JAY KEMPE is a 63 y.o. female here with fall with obvious injuries. Will get preop labs, xrays, give pain meds.   8:34 PM Xray showed R tib/fib fracture and R humeral head fracture. I called Dr. Maureen Ralphs, who will admit.   Wandra Arthurs, MD 07/18/13 2035

## 2013-07-18 NOTE — ED Notes (Signed)
Pt remains in x-ray at this time.  Family updated.

## 2013-07-18 NOTE — Progress Notes (Signed)
Orthopedic Tech Progress Note Patient Details:  [name] [birth year] [id number]  Ortho Devices Type of Ortho Device: Ace wrap;Arm sling;Stirrup splint;Post (short leg) splint Ortho Device/Splint Location: sling RUE   splint RLE Ortho Device/Splint Interventions: Ordered;Application   Braulio Bosch 07/18/2013, 9:43 PM

## 2013-07-18 NOTE — ED Notes (Signed)
Received pt from home with c/o while walking down her steps, she missed the last step and fell backwards. Deformity to right lower leg noted. Pt c/o right shoulder pain also. Pt denies + LOC. Pt given 100 mcg of fentanyl by EMS.

## 2013-07-18 NOTE — ED Notes (Signed)
Transporting patient to new room assignment. 

## 2013-07-18 NOTE — ED Notes (Signed)
Patient transported to XR. 

## 2013-07-18 NOTE — ED Notes (Signed)
MD at Bedside.

## 2013-07-18 NOTE — ED Notes (Signed)
Family at bedside complained that pt did not have a pillow. Pt reported to family that she did not ask for one. Pt was just removed from LSB, collar and head blocks. Pillow given to pt after medicating for her pain.

## 2013-07-18 NOTE — H&P (Signed)
[name] is an 63 y.o. female.   Chief Complaint: Right shoulder and right leg pain HPI: Ms. [name] is a 63 yo female who was walking down the steps at a friends house earlier this evening and missed the last step landing on her right side. She denies any head trauma, LOC, chest pain, palpitations, SOB or dizziness associated with the fall. She had immediate pain right shoulder and right lower leg. Denies any numbness, tingling in right arm or leg. Only complaints are right shoulder pain and right lower leg pain. Presented to [facility] ED with right tib/fib fracture and right proximal humerus fracture.  Past Medical History  Diagnosis Date  . Hypertension   . Thyroid disease     S/p Radiation  . [place] 03/16/2010    History reviewed. No pertinent past surgical history.  No family history on file. Social History:  reports that she has been smoking.  She does not have any smokeless tobacco history on file. Her alcohol and drug histories are not on file.  Allergies:  Allergies  Allergen Reactions  . Oxycodone Hcl     REACTION: hallucinations     (Not in a hospital admission)  Results for orders placed during the hospital encounter of 07/18/13 (from the past 48 hour(s))  CBC WITH DIFFERENTIAL     Status: Abnormal   Collection Time    07/18/13  6:37 PM      Result Value Range   WBC 9.7  4.0 - 10.5 K/uL   RBC 3.69 (*) 3.87 - 5.11 MIL/uL   Hemoglobin 11.4 (*) 12.0 - 15.0 g/dL   HCT 30.8 (*) 36.0 - 46.0 %   MCV 83.5  78.0 - 100.0 fL   MCH 30.9  26.0 - 34.0 pg   MCHC 37.0 (*) 30.0 - 36.0 g/dL   Comment: CORRECTED FOR COLD AGGLUTININS   RDW 12.6  11.5 - 15.5 %   Platelets 149 (*) 150 - 400 K/uL   Neutrophils Relative % 69  43 - 77 %   Neutro Abs 6.7  1.7 - 7.7 K/uL   Lymphocytes Relative 24  12 - 46 %   Lymphs Abs 2.3  0.7 - 4.0 K/uL   Monocytes Relative 5  3 - 12 %   Monocytes Absolute 0.4  0.1 - 1.0 K/uL   Eosinophils Relative 3  0 - 5 %   Eosinophils Absolute 0.3  0.0  - 0.7 K/uL   Basophils Relative 0  0 - 1 %   Basophils Absolute 0.0  0.0 - 0.1 K/uL  COMPREHENSIVE METABOLIC PANEL     Status: Abnormal   Collection Time    07/18/13  6:37 PM      Result Value Range   Sodium 135 (*) 137 - 147 mEq/L   Potassium 4.0  3.7 - 5.3 mEq/L   Chloride 98  96 - 112 mEq/L   CO2 25  19 - 32 mEq/L   Glucose, Bld 104 (*) 70 - 99 mg/dL   BUN 15  6 - 23 mg/dL   Creatinine, Ser 0.65  0.50 - 1.10 mg/dL   Calcium 9.3  8.4 - 10.5 mg/dL   Total Protein 6.9  6.0 - 8.3 g/dL   Albumin 3.9  3.5 - 5.2 g/dL   AST 23  0 - 37 U/L   Comment: HEMOLYSIS AT THIS LEVEL MAY AFFECT RESULT   ALT 23  0 - 35 U/L   Alkaline Phosphatase 83  39 - 117 U/L  Total Bilirubin 1.1  0.3 - 1.2 mg/dL   GFR calc non Af Amer >90  >90 mL/min   GFR calc Af Amer >90  >90 mL/min   Comment: (NOTE)     The eGFR has been calculated using the CKD EPI equation.     This calculation has not been validated in all clinical situations.     eGFR's persistently <90 mL/min signify possible Chronic Kidney     Disease.  PROTIME-INR     Status: None   Collection Time    07/18/13  6:37 PM      Result Value Range   Prothrombin Time 14.0  11.6 - 15.2 seconds   INR 1.10  0.00 - 1.49  TYPE AND SCREEN     Status: None   Collection Time    07/18/13  6:47 PM      Result Value Range   ABO/RH(D) A POS     Antibody Screen NEG     Sample Expiration 07/21/2013     Dg Chest 1 View  07/18/2013   CLINICAL DATA:  Post fall with shoulder pain.  EXAM: CHEST - 1 VIEW  COMPARISON:  09/09/2011  FINDINGS: Lungs are clear. There is mild stable cardiomegaly. There is a displaced somewhat comminuted right humeral head/ neck fracture.  IMPRESSION: No acute cardiopulmonary disease. Right humeral head/ neck fracture.   Electronically Signed   By: Marin Olp M.D.   On: 07/18/2013 20:06   Dg Cervical Spine 2 Or 3 Views  07/18/2013   CLINICAL DATA:  Fall.  EXAM: CERVICAL SPINE - 2-3 VIEW  COMPARISON:  None.  FINDINGS: The cervical  spine, particularly the lower cervical spine is poorly imaged due to the overlying shoulders. CT of the cervical spine is suggested for further evaluation.  IMPRESSION: Lower cervical spine inadequately imaged due to overlying shoulders. CT of the cervical spine is suggested.   Electronically Signed   By: Marcello Moores  Register   On: 07/18/2013 20:13   Dg Pelvis 1-2 Views  07/18/2013   CLINICAL DATA:  Post fall.  EXAM: PELVIS - 1-2 VIEW  COMPARISON:  None.  FINDINGS: There is mild diffuse osteopenia. There are symmetric mild degenerative changes of the hips. There is no definite acute fracture or dislocation. There are mild degenerative changes of the spine.  IMPRESSION: No acute fracture.   Electronically Signed   By: Marin Olp M.D.   On: 07/18/2013 20:10   Dg Shoulder Right  07/18/2013   CLINICAL DATA:  Pain post fall.  EXAM: RIGHT SHOULDER - 2+ VIEW; RIGHT HUMERUS - 2+ VIEW  COMPARISON:  None.  FINDINGS: There is an acute displaced fracture of the right humeral head and neck with prominent displaced greater tuberosity fragment. No evidence of dislocation. Remainder the exam is unremarkable.  IMPRESSION: Displaced and comminuted fracture of the right humeral head/ neck with prominent displaced greater tuberosity fragment.   Electronically Signed   By: Marin Olp M.D.   On: 07/18/2013 20:08   Dg Tibia/fibula Right  07/18/2013   CLINICAL DATA:  Pain post fall.  EXAM: RIGHT TIBIA AND FIBULA - 2 VIEW  COMPARISON:  None.  FINDINGS: There is diffuse osteopenia. There are displaced transverse fractures of the mid tibial and fibular diaphyseal regions. There is mild lateral in posterior angulation of the distal fragments. There is also a suggestion of a nondisplaced fracture the base of the fibular head. There is a well-defined ovoid lucency over the lateral cortex of the mid to distal  fibular diaphysis.  IMPRESSION: Displaced transverse fractures of the mid to distal diaphysis of the tibia and fibula.  Nondisplaced fracture of the base of the fibular head.  Ovoid lucency over the lateral cortex of the mid to distal fibular diaphysis. Cannot exclude metastatic disease or multiple myeloma. Recommend clinical correlation.   Electronically Signed   By: Marin Olp M.D.   On: 07/18/2013 20:15   Dg Humerus Right  07/18/2013   CLINICAL DATA:  Pain post fall.  EXAM: RIGHT SHOULDER - 2+ VIEW; RIGHT HUMERUS - 2+ VIEW  COMPARISON:  None.  FINDINGS: There is an acute displaced fracture of the right humeral head and neck with prominent displaced greater tuberosity fragment. No evidence of dislocation. Remainder the exam is unremarkable.  IMPRESSION: Displaced and comminuted fracture of the right humeral head/ neck with prominent displaced greater tuberosity fragment.   Electronically Signed   By: Marin Olp M.D.   On: 07/18/2013 20:08    Review of Systems  Constitutional: Negative.   HENT: Negative.   Eyes: Negative.   Respiratory: Negative.   Cardiovascular: Negative.   Gastrointestinal: Negative.   Genitourinary: Negative.   Musculoskeletal: Positive for falls.  Skin: Negative.   Neurological: Negative.   Endo/Heme/Allergies: Negative.   Psychiatric/Behavioral: Negative.     Blood pressure 148/67, pulse 44, temperature 98.1 F (36.7 C), temperature source Oral, resp. rate 16, height _0  (1.6 m), weight 189 lb (85.73 kg), SpO2 96.00%. Physical Exam  Physical Examination: General appearance - alert, well appearing, and in no distress Mental status - alert, oriented to person, place, and time Neck - supple, no significant adenopathy Chest - clear to auscultation, no wheezes, rales or rhonchi, symmetric air entry Heart - normal rate, regular rhythm, normal S1, S2, no murmurs, rubs, clicks or gallops Abdomen - soft, nontender, nondistended, no masses or organomegaly Neurological - alert, oriented, normal speech, no focal findings or movement disorder noted Musculoskeletal -, Left upper and  lower extremity exam normal. Pelvis stable to compression and distraction.  Tender right shoulder without deformity; right elbow and wrist exam normal; pulses sensation and  Motor Intact RUE (with exception of moving shoulder secondary to pain) Right hip and knee exams  normal; Right lower leg mid tibial deformity with skin closed (closed fracture); pulses and sensation  intact; EHL/FHL/TA/gastroc intact; COMPARTMENTS SOFT    Assessment/Plan 1- Right tib/fib fracture- Plan splint tonight and to OR in AM for IM nail right tibia. Discussed procedure,risks potential complications with patient who elects to proceed. 2- Right proximal humerus fracture- Displaced 4 part fracture that will most likely need hemiarthroplasty. I have told the patient that I will discuss this with one of our shoulder specialists and they will perform the surgical procedure.  Gearlean Alf 07/18/2013, 9:19 PM

## 2013-07-19 ENCOUNTER — Encounter (HOSPITAL_COMMUNITY): Payer: Self-pay | Admitting: Certified Registered"

## 2013-07-19 ENCOUNTER — Encounter (HOSPITAL_COMMUNITY): Payer: Medicare HMO | Admitting: Certified Registered Nurse Anesthetist

## 2013-07-19 ENCOUNTER — Inpatient Hospital Stay (HOSPITAL_COMMUNITY): Payer: Medicare HMO | Admitting: Certified Registered Nurse Anesthetist

## 2013-07-19 ENCOUNTER — Inpatient Hospital Stay (HOSPITAL_COMMUNITY): Payer: Medicare HMO

## 2013-07-19 ENCOUNTER — Encounter (HOSPITAL_COMMUNITY)
Admission: EM | Disposition: A | Discharge status: Skilled Nursing Facility | Payer: Self-pay | Source: Home / Self Care | Attending: Orthopedic Surgery

## 2013-07-19 HISTORY — PX: TIBIA IM NAIL INSERTION: SHX2516

## 2013-07-19 LAB — SURGICAL PCR SCREEN
MRSA, PCR: NEGATIVE
Staphylococcus aureus: NEGATIVE

## 2013-07-19 IMAGING — RF DG TIBIA/FIBULA 2V*R*
1 series · 5 of 5 positions shown · non-contrast
Comparison: Yesterday

CLINICAL DATA: Tibia fracture

EXAM:
RIGHT TIBIA AND FIBULA - 2 VIEW

[Series 1: run · 5 of 5 slices shown]
[im 1/5]
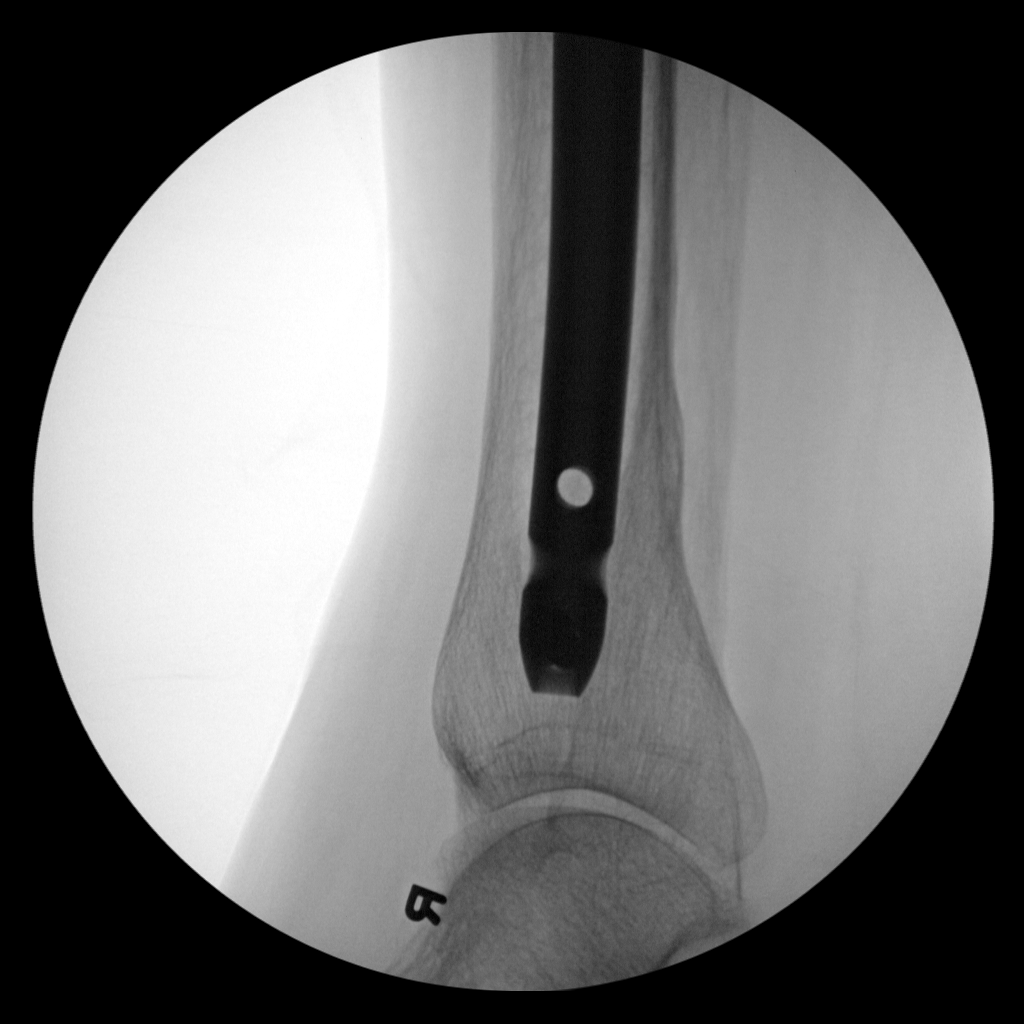
[im 2/5]
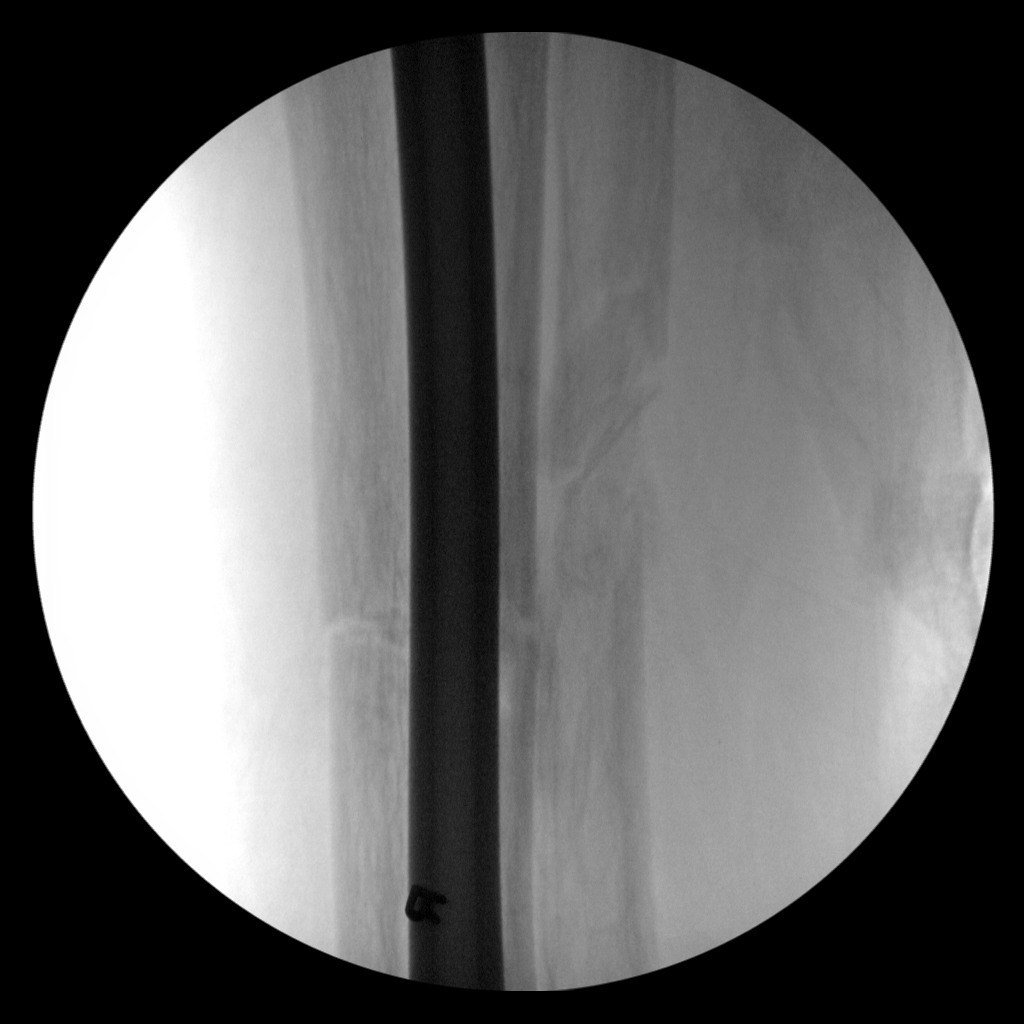
[im 3/5]
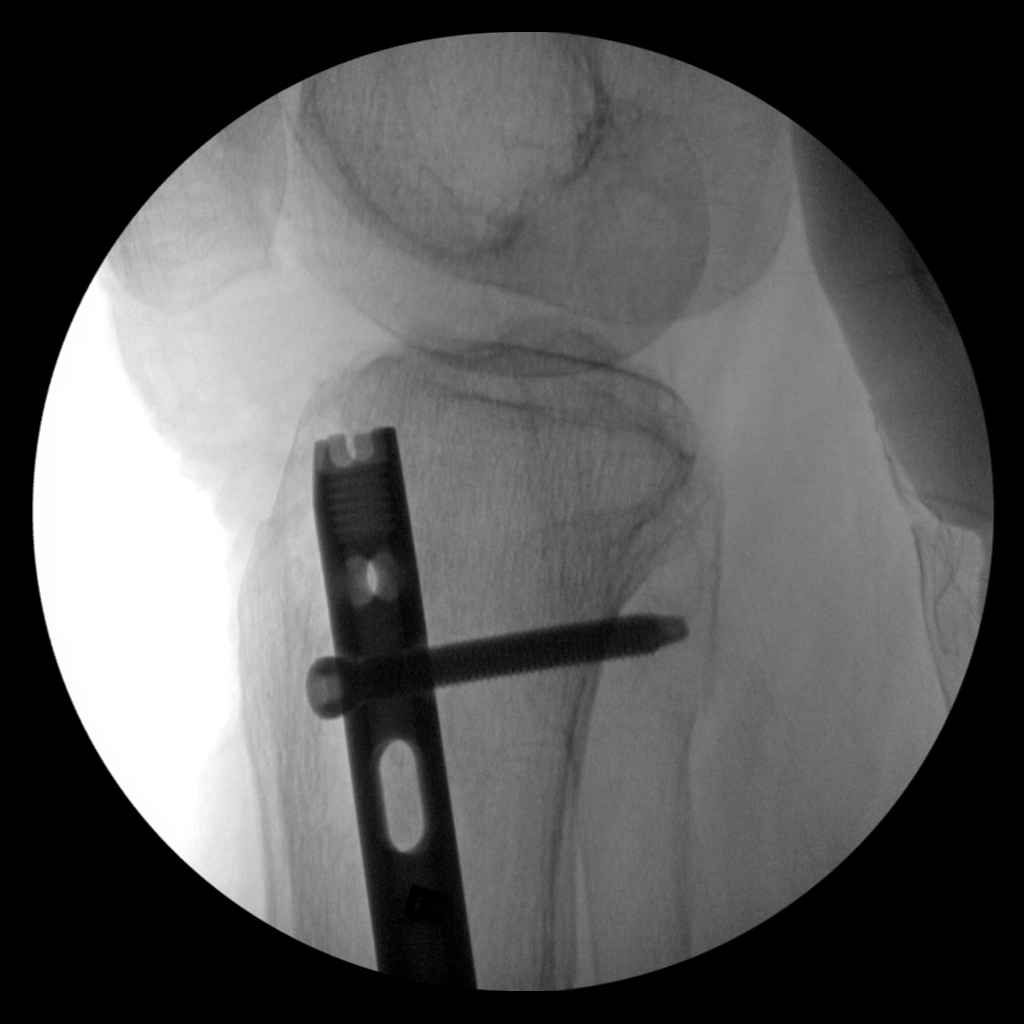
[im 4/5]
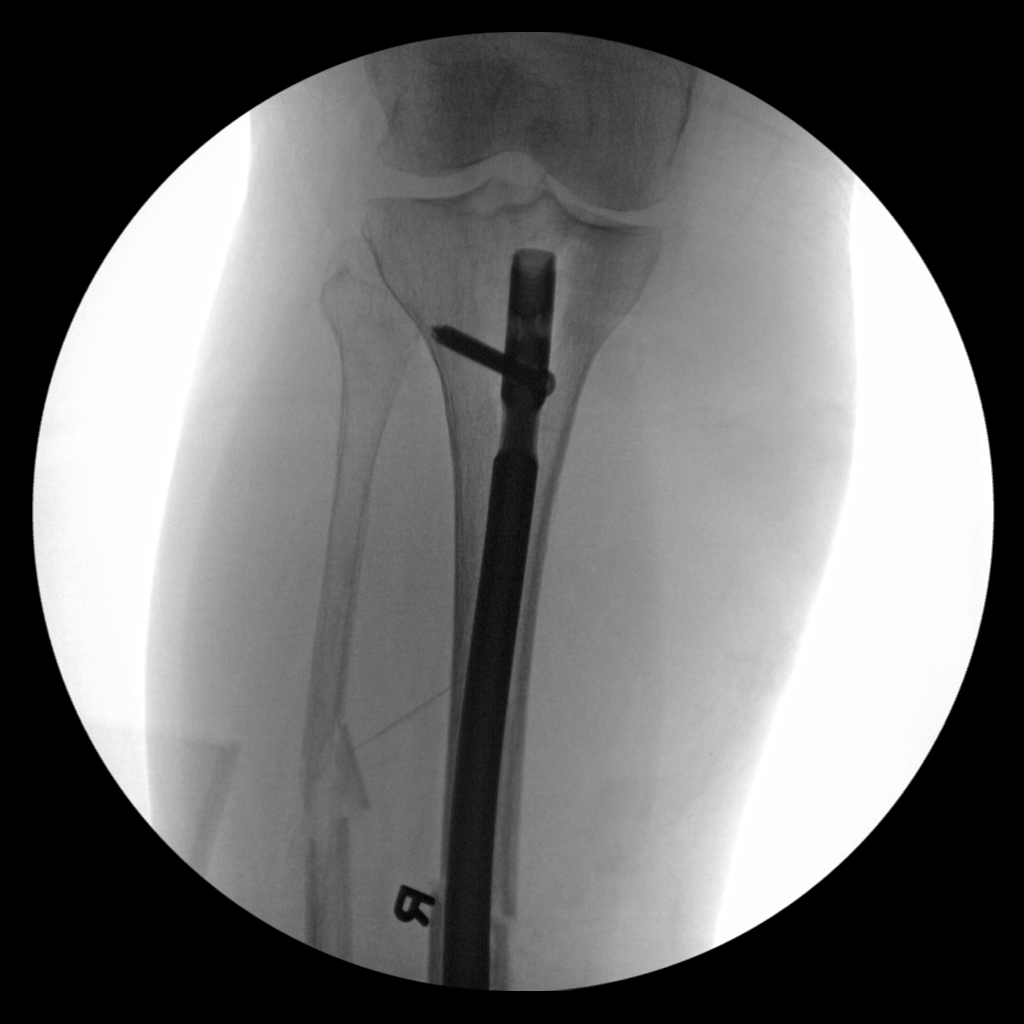
[im 5/5]
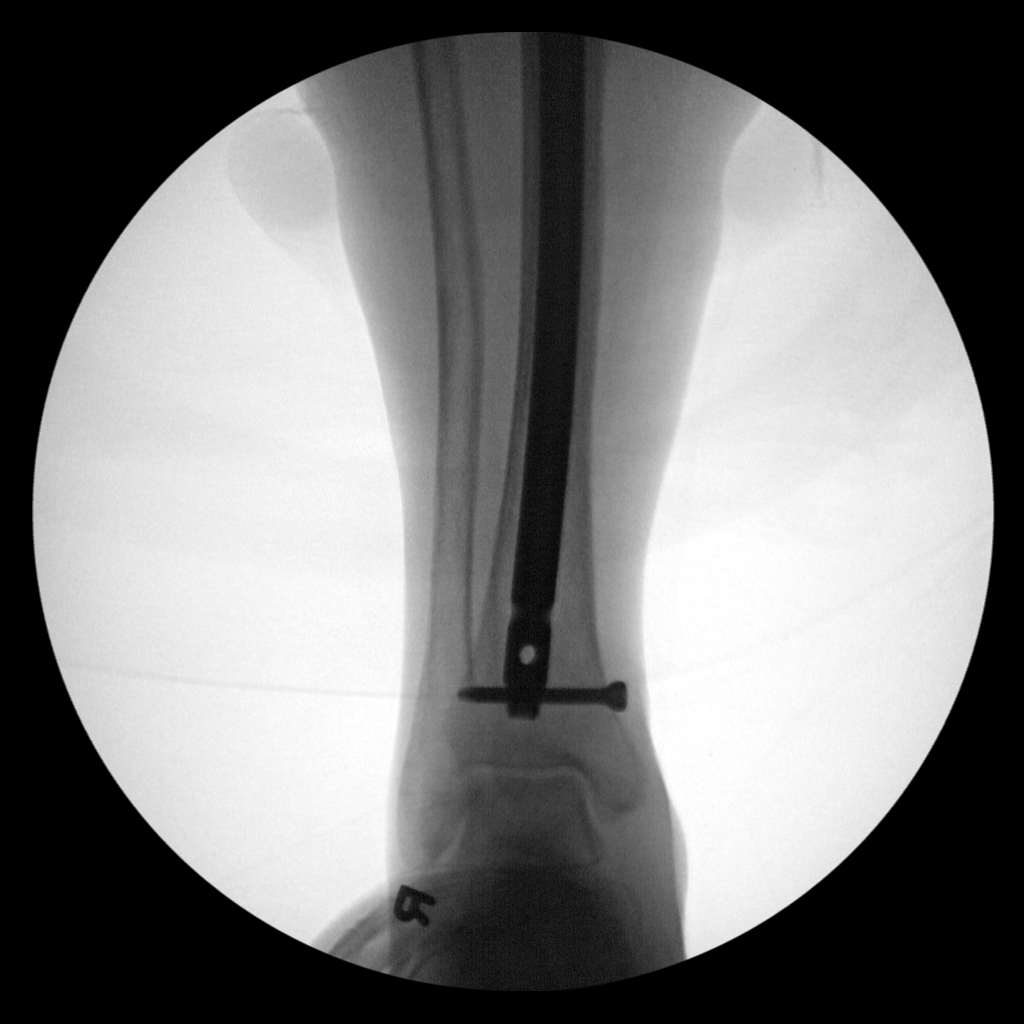

[5 of 5 positions shown; findings below may reference images not displayed]

FINDINGS: An intra medullary rod transfixes a mid shaft tibia fracture. No
breakage or loosening of the hardware. One proximal and 1 distal
interlocking screw.
IMPRESSION: ORIF tibia fracture.

## 2013-07-19 SURGERY — INSERTION, INTRAMEDULLARY ROD, TIBIA
Anesthesia: General | Site: Leg Lower | Laterality: Right

## 2013-07-19 MED ORDER — ATORVASTATIN CALCIUM 20 MG PO TABS
20.0000 mg | ORAL_TABLET | Freq: Every day | ORAL | Status: DC
  Administered 2013-07-19 – 2013-07-21 (×3): 20 mg via ORAL
  Filled 2013-07-19 (×4): qty 1

## 2013-07-19 MED ORDER — FENTANYL CITRATE 0.05 MG/ML IJ SOLN: 50.0000 ug | Freq: Once | INTRAMUSCULAR | Status: DC

## 2013-07-19 MED ORDER — WHITE PETROLATUM GEL
Status: AC
  Administered 2013-07-19: 0.2
  Filled 2013-07-19: qty 5

## 2013-07-19 MED ORDER — FENTANYL CITRATE 0.05 MG/ML IJ SOLN
INTRAMUSCULAR | Status: DC | PRN
  Administered 2013-07-19: 50 ug via INTRAVENOUS
  Administered 2013-07-19: 100 ug via INTRAVENOUS
  Administered 2013-07-19: 50 ug via INTRAVENOUS

## 2013-07-19 MED ORDER — ONDANSETRON HCL 4 MG PO TABS: 4.0000 mg | ORAL_TABLET | Freq: Four times a day (QID) | ORAL | Status: DC | PRN

## 2013-07-19 MED ORDER — PHENYLEPHRINE HCL 10 MG/ML IJ SOLN
INTRAMUSCULAR | Status: DC | PRN
  Administered 2013-07-19 (×4): 80 ug via INTRAVENOUS

## 2013-07-19 MED ORDER — LIDOCAINE HCL 2 % EX GEL
CUTANEOUS | Status: AC
  Filled 2013-07-19: qty 20

## 2013-07-19 MED ORDER — LACTATED RINGERS IV SOLN
INTRAVENOUS | Status: DC | PRN
  Administered 2013-07-19 (×2): via INTRAVENOUS

## 2013-07-19 MED ORDER — MIDAZOLAM HCL 2 MG/2ML IJ SOLN
INTRAMUSCULAR | Status: AC
Start: 2013-07-19 — End: 2013-07-19
  Filled 2013-07-19: qty 2

## 2013-07-19 MED ORDER — METOCLOPRAMIDE HCL 5 MG/ML IJ SOLN: 5.0000 mg | Freq: Three times a day (TID) | INTRAMUSCULAR | Status: DC | PRN

## 2013-07-19 MED ORDER — CEFAZOLIN SODIUM-DEXTROSE 2-3 GM-% IV SOLR
INTRAVENOUS | Status: AC
  Filled 2013-07-19: qty 50

## 2013-07-19 MED ORDER — LEVOTHYROXINE SODIUM 125 MCG PO TABS
125.0000 ug | ORAL_TABLET | Freq: Every day | ORAL | Status: DC
  Administered 2013-07-19 – 2013-07-21 (×3): 125 ug via ORAL
  Filled 2013-07-19 (×4): qty 1

## 2013-07-19 MED ORDER — ONDANSETRON HCL 4 MG/2ML IJ SOLN
INTRAMUSCULAR | Status: DC | PRN
  Administered 2013-07-19: 4 mg via INTRAVENOUS

## 2013-07-19 MED ORDER — GLYCOPYRROLATE 0.2 MG/ML IJ SOLN
INTRAMUSCULAR | Status: DC | PRN
  Administered 2013-07-19: 0.4 mg via INTRAVENOUS

## 2013-07-19 MED ORDER — ONDANSETRON HCL 4 MG/2ML IJ SOLN
INTRAMUSCULAR | Status: AC
  Filled 2013-07-19: qty 2

## 2013-07-19 MED ORDER — NEOSTIGMINE METHYLSULFATE 1 MG/ML IJ SOLN
INTRAMUSCULAR | Status: DC | PRN
  Administered 2013-07-19: 3 mg via INTRAVENOUS

## 2013-07-19 MED ORDER — CEFAZOLIN SODIUM-DEXTROSE 2-3 GM-% IV SOLR
2.0000 g | Freq: Four times a day (QID) | INTRAVENOUS | Status: AC
  Administered 2013-07-19 – 2013-07-20 (×3): 2 g via INTRAVENOUS
  Filled 2013-07-19 (×3): qty 50

## 2013-07-19 MED ORDER — OXYCODONE HCL 5 MG PO TABS
5.0000 mg | ORAL_TABLET | Freq: Once | ORAL | Status: DC | PRN
Start: 2013-07-19 — End: 2013-07-19

## 2013-07-19 MED ORDER — CEFAZOLIN SODIUM-DEXTROSE 2-3 GM-% IV SOLR
INTRAVENOUS | Status: DC | PRN
  Administered 2013-07-19: 2 g via INTRAVENOUS

## 2013-07-19 MED ORDER — ENOXAPARIN SODIUM 30 MG/0.3ML ~~LOC~~ SOLN
30.0000 mg | Freq: Two times a day (BID) | SUBCUTANEOUS | Status: DC
  Administered 2013-07-19 – 2013-07-21 (×4): 30 mg via SUBCUTANEOUS
  Filled 2013-07-19 (×6): qty 0.3

## 2013-07-19 MED ORDER — ALBUTEROL SULFATE HFA 108 (90 BASE) MCG/ACT IN AERS
INHALATION_SPRAY | RESPIRATORY_TRACT | Status: DC | PRN
  Administered 2013-07-19: 4 via RESPIRATORY_TRACT

## 2013-07-19 MED ORDER — PROPOFOL 10 MG/ML IV BOLUS
INTRAVENOUS | Status: AC
  Filled 2013-07-19: qty 20

## 2013-07-19 MED ORDER — HYDROMORPHONE HCL PF 1 MG/ML IJ SOLN
0.2500 mg | INTRAMUSCULAR | Status: DC | PRN
  Administered 2013-07-19 – 2013-07-22 (×3): 0.5 mg via INTRAVENOUS
  Filled 2013-07-19: qty 1

## 2013-07-19 MED ORDER — PROPOFOL 10 MG/ML IV BOLUS
INTRAVENOUS | Status: DC | PRN
  Administered 2013-07-19: 180 mg via INTRAVENOUS
  Administered 2013-07-19: 20 mg via INTRAVENOUS

## 2013-07-19 MED ORDER — LIDOCAINE HCL (CARDIAC) 20 MG/ML IV SOLN
INTRAVENOUS | Status: DC | PRN
  Administered 2013-07-19: 100 mg via INTRAVENOUS

## 2013-07-19 MED ORDER — CITALOPRAM HYDROBROMIDE 20 MG PO TABS
20.0000 mg | ORAL_TABLET | Freq: Every day | ORAL | Status: DC
  Administered 2013-07-19 – 2013-07-22 (×4): 20 mg via ORAL
  Filled 2013-07-19 (×4): qty 1

## 2013-07-19 MED ORDER — FENTANYL CITRATE 0.05 MG/ML IJ SOLN
INTRAMUSCULAR | Status: AC
  Filled 2013-07-19: qty 5

## 2013-07-19 MED ORDER — GLYCOPYRROLATE 0.2 MG/ML IJ SOLN
INTRAMUSCULAR | Status: AC
  Filled 2013-07-19: qty 2

## 2013-07-19 MED ORDER — ONDANSETRON HCL 4 MG/2ML IJ SOLN: 4.0000 mg | Freq: Four times a day (QID) | INTRAMUSCULAR | Status: DC | PRN

## 2013-07-19 MED ORDER — LIDOCAINE HCL (CARDIAC) 20 MG/ML IV SOLN
INTRAVENOUS | Status: AC
  Filled 2013-07-19: qty 5

## 2013-07-19 MED ORDER — OXYCODONE HCL 5 MG/5ML PO SOLN: 5.0000 mg | Freq: Once | ORAL | Status: DC | PRN

## 2013-07-19 MED ORDER — MIDAZOLAM HCL 2 MG/2ML IJ SOLN: 1.0000 mg | INTRAMUSCULAR | Status: DC | PRN

## 2013-07-19 MED ORDER — PROMETHAZINE HCL 25 MG/ML IJ SOLN: 6.2500 mg | INTRAMUSCULAR | Status: DC | PRN

## 2013-07-19 MED ORDER — 0.9 % SODIUM CHLORIDE (POUR BTL) OPTIME
TOPICAL | Status: DC | PRN
  Administered 2013-07-19: 250 mL

## 2013-07-19 MED ORDER — ROCURONIUM BROMIDE 100 MG/10ML IV SOLN
INTRAVENOUS | Status: DC | PRN
  Administered 2013-07-19: 50 mg via INTRAVENOUS

## 2013-07-19 MED ORDER — NEOSTIGMINE METHYLSULFATE 1 MG/ML IJ SOLN
INTRAMUSCULAR | Status: AC
  Filled 2013-07-19: qty 10

## 2013-07-19 MED ORDER — METOCLOPRAMIDE HCL 10 MG PO TABS: 5.0000 mg | ORAL_TABLET | Freq: Three times a day (TID) | ORAL | Status: DC | PRN

## 2013-07-19 MED ORDER — HYDROMORPHONE HCL PF 1 MG/ML IJ SOLN
INTRAMUSCULAR | Status: AC
  Filled 2013-07-19: qty 1

## 2013-07-19 MED ORDER — ALBUTEROL SULFATE HFA 108 (90 BASE) MCG/ACT IN AERS
INHALATION_SPRAY | RESPIRATORY_TRACT | Status: AC
  Filled 2013-07-19: qty 6.7

## 2013-07-19 MED ORDER — ROCURONIUM BROMIDE 50 MG/5ML IV SOLN
INTRAVENOUS | Status: AC
  Filled 2013-07-19: qty 1

## 2013-07-19 SURGICAL SUPPLY — 56 items
BANDAGE ELASTIC 4 VELCRO ST LF (GAUZE/BANDAGES/DRESSINGS) ×2 IMPLANT
BANDAGE ELASTIC 6 VELCRO ST LF (GAUZE/BANDAGES/DRESSINGS) ×2 IMPLANT
BANDAGE ESMARK 6X9 LF (GAUZE/BANDAGES/DRESSINGS) IMPLANT
BANDAGE GAUZE ELAST BULKY 4 IN (GAUZE/BANDAGES/DRESSINGS) ×2 IMPLANT
BIT DRILL 3.8X6 NS (BIT) ×1 IMPLANT
BIT DRILL 4.4 NS (BIT) ×1 IMPLANT
BNDG CMPR 9X6 STRL LF SNTH (GAUZE/BANDAGES/DRESSINGS) ×1
BNDG COHESIVE 4X5 TAN STRL (GAUZE/BANDAGES/DRESSINGS) ×2 IMPLANT
BNDG ESMARK 6X9 LF (GAUZE/BANDAGES/DRESSINGS) ×2
CLOTH BEACON ORANGE TIMEOUT ST (SAFETY) ×2 IMPLANT
COVER SURGICAL LIGHT HANDLE (MISCELLANEOUS) ×3 IMPLANT
CUFF TOURNIQUET SINGLE 34IN LL (TOURNIQUET CUFF) ×1 IMPLANT
CUFF TOURNIQUET SINGLE 44IN (TOURNIQUET CUFF) IMPLANT
DRAPE C-ARM 42X72 X-RAY (DRAPES) ×2 IMPLANT
DRAPE C-ARMOR (DRAPES) ×1 IMPLANT
DRAPE INCISE IOBAN 66X45 STRL (DRAPES) ×2 IMPLANT
DRAPE ORTHO SPLIT 77X108 STRL (DRAPES)
DRAPE SURG ORHT 6 SPLT 77X108 (DRAPES) ×2 IMPLANT
DRAPE U-SHAPE 47X51 STRL (DRAPES) ×2 IMPLANT
DRSG ADAPTIC 3X8 NADH LF (GAUZE/BANDAGES/DRESSINGS) ×2 IMPLANT
ELECT REM PT RETURN 9FT ADLT (ELECTROSURGICAL) ×2
ELECTRODE REM PT RTRN 9FT ADLT (ELECTROSURGICAL) ×1 IMPLANT
EVACUATOR 1/8 PVC DRAIN (DRAIN) IMPLANT
GLOVE BIO SURGEON STRL SZ8 (GLOVE) ×3 IMPLANT
GLOVE BIOGEL M 8.0 STRL (GLOVE) ×1 IMPLANT
GLOVE BIOGEL PI IND STRL 8 (GLOVE) ×1 IMPLANT
GLOVE BIOGEL PI INDICATOR 8 (GLOVE) ×2
GOWN PREVENTION PLUS XLARGE (GOWN DISPOSABLE) ×2 IMPLANT
GOWN STRL NON-REIN LRG LVL3 (GOWN DISPOSABLE) ×6 IMPLANT
GUIDEWIRE BALL NOSE 80CM (WIRE) ×1 IMPLANT
KIT BASIN OR (CUSTOM PROCEDURE TRAY) ×2 IMPLANT
KIT ROOM TURNOVER OR (KITS) ×2 IMPLANT
NAIL TIBIAL 11MMX33CM (Nail) ×1 IMPLANT
PACK ORTHO EXTREMITY (CUSTOM PROCEDURE TRAY) ×2 IMPLANT
PAD ARMBOARD 7.5X6 YLW CONV (MISCELLANEOUS) ×4 IMPLANT
PAD CAST 4YDX4 CTTN HI CHSV (CAST SUPPLIES) IMPLANT
PADDING CAST COTTON 4X4 STRL (CAST SUPPLIES) ×2
PADDING CAST COTTON 6X4 STRL (CAST SUPPLIES) ×1 IMPLANT
PIN GUIDE ACE (PIN) ×1 IMPLANT
SCREW ACECAP 42MM (Screw) ×1 IMPLANT
SCREW PROXIMAL DEPUY (Screw) ×4 IMPLANT
SCREW PRXML FT 50X5.5XLCK NS (Screw) IMPLANT
SCREW PRXML FT 60X5.5XNS LF (Screw) IMPLANT
SPONGE GAUZE 4X4 12PLY (GAUZE/BANDAGES/DRESSINGS) ×2 IMPLANT
SPONGE GAUZE 4X4 12PLY STER LF (GAUZE/BANDAGES/DRESSINGS) ×1 IMPLANT
STAPLER VISISTAT 35W (STAPLE) IMPLANT
STOCKINETTE TUBULAR 6 INCH (GAUZE/BANDAGES/DRESSINGS) ×2 IMPLANT
SUT PROLENE 3 0 PS 2 (SUTURE) IMPLANT
SUT VIC AB 0 CT1 27 (SUTURE)
SUT VIC AB 0 CT1 27XBRD ANBCTR (SUTURE) IMPLANT
SUT VIC AB 2-0 CT1 27 (SUTURE)
SUT VIC AB 2-0 CT1 TAPERPNT 27 (SUTURE) IMPLANT
TOWEL OR 17X24 6PK STRL BLUE (TOWEL DISPOSABLE) ×2 IMPLANT
TOWEL OR 17X26 10 PK STRL BLUE (TOWEL DISPOSABLE) ×2 IMPLANT
TUBE CONNECTING 12X1/4 (SUCTIONS) ×2 IMPLANT
YANKAUER SUCT BULB TIP NO VENT (SUCTIONS) ×2 IMPLANT

## 2013-07-19 NOTE — Preoperative (Signed)
Beta Blockers   Reason not to administer Beta Blockers:Not Applicable 

## 2013-07-19 NOTE — Progress Notes (Signed)
Patient to OR

## 2013-07-19 NOTE — Brief Op Note (Signed)
07/18/2013 - 07/19/2013  8:28 AM  PATIENT:  [name]  63 y.o. female  PRE-OPERATIVE DIAGNOSIS:  Right tibia/fibula fracture  POST-OPERATIVE DIAGNOSIS:  same  PROCEDURE:  Procedure(s): INTRAMEDULLARY (IM) NAIL TIBIAL (Right)  SURGEON:  Surgeon(s) and Role:    * Gearlean Alf, MD - Primary  PHYSICIAN ASSISTANT:   ASSISTANTS: Arlee Muslim, PA-C   ANESTHESIA:   general  EBL:  Total I/O In: 1000 [I.V.:1000] Out: 200 [Urine:200]  BLOOD ADMINISTERED:none  DRAINS: none   LOCAL MEDICATIONS USED:  NONE  COUNTS:  YES  TOURNIQUET:   Total Tourniquet Time Documented: Thigh (Left) - 35 minutes Total: Thigh (Left) - 35 minutes   DICTATION: .Other Dictation: Dictation Number [id number]  PLAN OF CARE: Admit to inpatient   PATIENT DISPOSITION:  PACU - hemodynamically stable.

## 2013-07-19 NOTE — Anesthesia Postprocedure Evaluation (Signed)
  Anesthesia Post-op Note  Patient: [name]  Procedure(s) Performed: Procedure(s): INTRAMEDULLARY (IM) NAIL TIBIAL (Right)  Patient Location: PACU  Anesthesia Type:General  Level of Consciousness: awake  Airway and Oxygen Therapy: Patient Spontanous Breathing  Post-op Pain: mild  Post-op Assessment: Post-op Vital signs reviewed, Patient's Cardiovascular Status Stable, Respiratory Function Stable, Patent Airway, No signs of Nausea or vomiting and Pain level controlled  Post-op Vital Signs: Reviewed and stable  Complications: No apparent anesthesia complications

## 2013-07-19 NOTE — Op Note (Signed)
NAME:  COUSINSJasiyah, [place] NO.:  [id number]  MEDICAL RECORD NO.:  [id number]  LOCATION:  [id number]                        FACILITY:  [facility]  PHYSICIAN:  Gaynelle Arabian, M.D.    DATE OF BIRTH:  11/27/1950  DATE OF PROCEDURE:  07/19/2013 DATE OF DISCHARGE:                              OPERATIVE REPORT   PREOPERATIVE DIAGNOSIS:  Right tib tibia/fibula fracture.  POSTOPERATIVE DIAGNOSIS:  Right tib tibia/fibula fracture.  PROCEDURE:  Intramedullary nail, right tibia.  SURGEON:  Gaynelle Arabian, M.D.  ASSISTANT:  Arlee Muslim, PA-C.  ANESTHESIA:  General.  ESTIMATED BLOOD LOSS:  Minimal.  DRAINS:  None.  TOURNIQUET TIME:  34 minutes at 300 mmHg.  COMPLICATIONS:  None.  CONDITION:  Stable to recovery.  BRIEF CLINICAL NOTE:  Ms. Gilvin is a 63 year old female, who tripped down a 1 step last night landing on her right side, sustaining a right tib-fib fracture and a right proximal humerus fracture.  She presents now for intramedullary nailing of the right tibia fracture.  PROCEDURE IN DETAIL:  After successful administration of general anesthetic, a tourniquet was placed high on her right thigh and her right lower extremity was prepped and draped in the usual sterile fashion.  Extremities wrapped in Esmarch.  Tourniquet was inflated to 300 mmHg.  A midline incision was made, starting at the top of the patella coursing to the tibial tubercle.  The skin was cut with a 10 blade through subcutaneous tissue and the superficial retinaculum was incised medial to the patellar tendon.  We found a starting point at the center of the proximal tibia and the guide pin for the tibial versa nail is passed at the center of the tibia AP and lateral.  The proximal reamer was then passed over the guide pin.  Short guide pins removed and the ball-tipped guide pin passed into tibial canal across the fracture site down to the level of the ankle.  Length of this is 33 cm.  We then reamed  over the guide pin, starting at 8 mm and going up to 12.5 mm in order to place an 11 mm diameter nail.  A 11 mm x 33 cm tibial nail was then placed onto the external guide for impaction.  The nail was impacted over the guidewire into the tibial canal across the fracture site to rest just above the physeal scar of the ankle.  Through the external guide, a proximal interlock screw was placed 50 mm in length. The screw had excellent purchase.  The external guide was then removed. Utilizing the freehand technique, in the lateral position, the distal interlock screw was then passed through small incision about the medial ankle.  Length of this is a 42 mm.  Excellent purchase was obtained. The wound was then copiously irrigated with saline solution.  The fracture was checked in AP and lateral views and the alignment was excellent.  The tourniquet then released total time of 34 minutes.  The interlock incisions closed with staples.  The proximal incision was closed with a 2-0 Vicryl in the retinaculum, 2-0 Vicryl subcutaneously and staples on the skin.  Incision was cleaned and dried and a bulky sterile dressing applied.  She was  placed into a CAM walker and awakened and transported to recovery in stable condition.  Note that a surgical assistant is a medical necessity for this procedure in order to properly align the fracture, both in the AP view and rotationally in order to appropriately place the hardware and also to hold the leg and lined up properly for placement of the interlock screws.     Gaynelle Arabian, M.D.     FA/MEDQ  D:  07/19/2013  T:  07/19/2013  Job:  [id number]

## 2013-07-19 NOTE — Anesthesia Procedure Notes (Signed)
Procedure Name: Intubation Date/Time: 07/19/2013 7:25 AM Performed by: Manuela Schwartz B Pre-anesthesia Checklist: Patient identified, Emergency Drugs available, Suction available, Patient being monitored and Timeout performed Patient Re-evaluated:Patient Re-evaluated prior to inductionOxygen Delivery Method: Circle system utilized Preoxygenation: Pre-oxygenation with 100% oxygen Intubation Type: IV induction Ventilation: Mask ventilation without difficulty Grade View: Grade I Tube type: Oral Tube size: 7.5 mm Number of attempts: 1 Airway Equipment and Method: Stylet and Video-laryngoscopy (elective glidescope) Placement Confirmation: ETT inserted through vocal cords under direct vision,  positive ETCO2 and breath sounds checked- equal and bilateral Secured at: 21 cm Tube secured with: Tape Dental Injury: Teeth and Oropharynx as per pre-operative assessment

## 2013-07-19 NOTE — Anesthesia Preprocedure Evaluation (Addendum)
Anesthesia Evaluation  Patient identified by MRN, date of birth, ID band Patient awake    Reviewed: Allergy & Precautions, H&P , NPO status , Patient's Chart, lab work & pertinent test results  Airway Mallampati: II TM Distance: >3 FB Neck ROM: Full    Dental  (+) Teeth Intact and Dental Advisory Given   Pulmonary COPDCurrent Smoker,  + rhonchi         Cardiovascular hypertension, Pt. on medications Rhythm:Regular Rate:Bradycardia     Neuro/Psych Mood disorderLower c spine not cleared    GI/Hepatic (+) Cirrhosis -    substance abuse  alcohol use,   Endo/Other  Hypothyroidism   Renal/GU      Musculoskeletal   Abdominal   Peds  Hematology   Anesthesia Other Findings C/o severe R shoulder pain-has fx humerus  Reproductive/Obstetrics                         Anesthesia Physical Anesthesia Plan  ASA: III and emergent  Anesthesia Plan: General   Post-op Pain Management:    Induction: Intravenous  Airway Management Planned: Oral ETT and Video Laryngoscope Planned  Additional Equipment:   Intra-op Plan:   Post-operative Plan: Extubation in OR  Informed Consent: I have reviewed the patients History and Physical, chart, labs and discussed the procedure including the risks, benefits and alternatives for the proposed anesthesia with the patient or authorized representative who has indicated his/her understanding and acceptance.   Dental advisory given  Plan Discussed with: Surgeon and CRNA  Anesthesia Plan Comments:        Anesthesia Quick Evaluation

## 2013-07-19 NOTE — Interval H&P Note (Signed)
History and Physical Interval Note:  07/19/2013 7:11 AM  [name]  has presented today for surgery, with the diagnosis of Right tibia/fibula fracture  The various methods of treatment have been discussed with the patient and family. After consideration of risks, benefits and other options for treatment, the patient has consented to  Procedure(s): INTRAMEDULLARY (IM) NAIL TIBIAL (Right) as a surgical intervention .  The patient's history has been reviewed, patient examined, no change in status, stable for surgery.  I have reviewed the patient's chart and labs.  Questions were answered to the patient's satisfaction.     Gearlean Alf

## 2013-07-19 NOTE — Progress Notes (Signed)
Pt given written and verbal education on bed bugs d/t ED staff observing bed bugs on pt's daughters.  Pt verbalized understanding.

## 2013-07-19 NOTE — Transfer of Care (Signed)
Immediate Anesthesia Transfer of Care Note  Patient: [name]  Procedure(s) Performed: Procedure(s): INTRAMEDULLARY (IM) NAIL TIBIAL (Right)  Patient Location: PACU  Anesthesia Type:General  Level of Consciousness: awake, alert  and oriented  Airway & Oxygen Therapy: Patient Spontanous Breathing and Patient connected to face mask oxygen  Post-op Assessment: Report given to PACU RN and Post -op Vital signs reviewed and stable  Post vital signs: Reviewed and stable  Complications: No apparent anesthesia complications

## 2013-07-20 ENCOUNTER — Inpatient Hospital Stay (HOSPITAL_COMMUNITY): Payer: Medicare HMO

## 2013-07-20 ENCOUNTER — Encounter (HOSPITAL_COMMUNITY): Payer: Self-pay | Admitting: General Practice

## 2013-07-20 LAB — CBC
HCT: 23.9 % — ABNORMAL LOW (ref 36.0–46.0)
HEMOGLOBIN: 8.8 g/dL — AB (ref 12.0–15.0)
MCH: 30.7 pg (ref 26.0–34.0)
MCHC: 36.8 g/dL — ABNORMAL HIGH (ref 30.0–36.0)
MCV: 83.3 fL (ref 78.0–100.0)
Platelets: 94 10*3/uL — ABNORMAL LOW (ref 150–400)
RBC: 2.87 MIL/uL — ABNORMAL LOW (ref 3.87–5.11)
RDW: 12.5 % (ref 11.5–15.5)
WBC: 8.9 10*3/uL (ref 4.0–10.5)

## 2013-07-20 LAB — BASIC METABOLIC PANEL
BUN: 9 mg/dL (ref 6–23)
CO2: 25 meq/L (ref 19–32)
CREATININE: 0.58 mg/dL (ref 0.50–1.10)
Calcium: 8.1 mg/dL — ABNORMAL LOW (ref 8.4–10.5)
Chloride: 97 mEq/L (ref 96–112)
GFR calc Af Amer: >90 mL/min (ref >90)
GFR calc non Af Amer: >90 mL/min (ref >90)
Glucose, Bld: 121 mg/dL — ABNORMAL HIGH (ref 70–99)
Potassium: 3.7 mEq/L (ref 3.7–5.3)
Sodium: 135 mEq/L — ABNORMAL LOW (ref 137–147)

## 2013-07-20 IMAGING — CT CT SHOULDER*R* W/O CM
2 of 5 series · 8 of 20 positions shown, 10 images · non-contrast
Comparison: Right shoulder radiographs same date.

CLINICAL DATA: Right shoulder pain status post fall. Evaluate
proximal humeral fracture.

EXAM:
CT OF THE RIGHT SHOULDER WITHOUT CONTRAST
TECHNIQUE: Multidetector CT imaging was performed according to the standard
protocol. Multiplanar CT image reconstructions were also generated.

[Series 5: coronal · coronal · 0.30mm/px · 3 of 70 slices shown]
[im 14/70  bone]
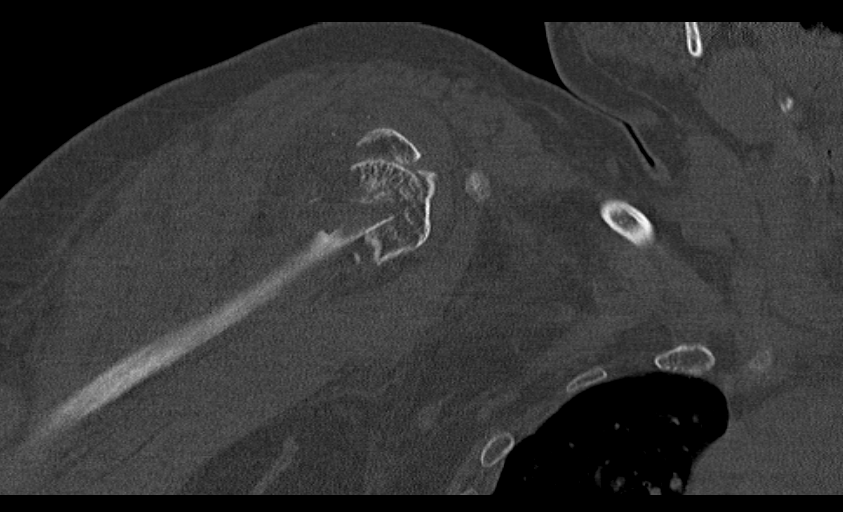
[im 28/70  bone]
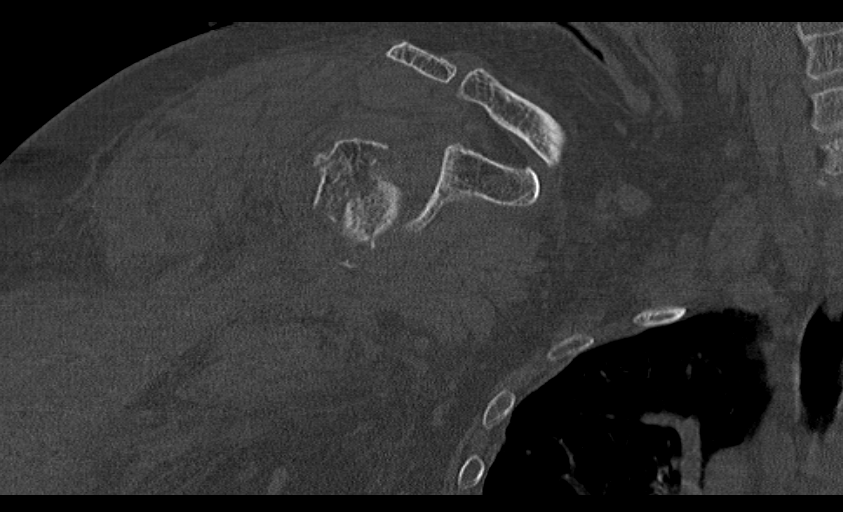
[im 42/70  bone]
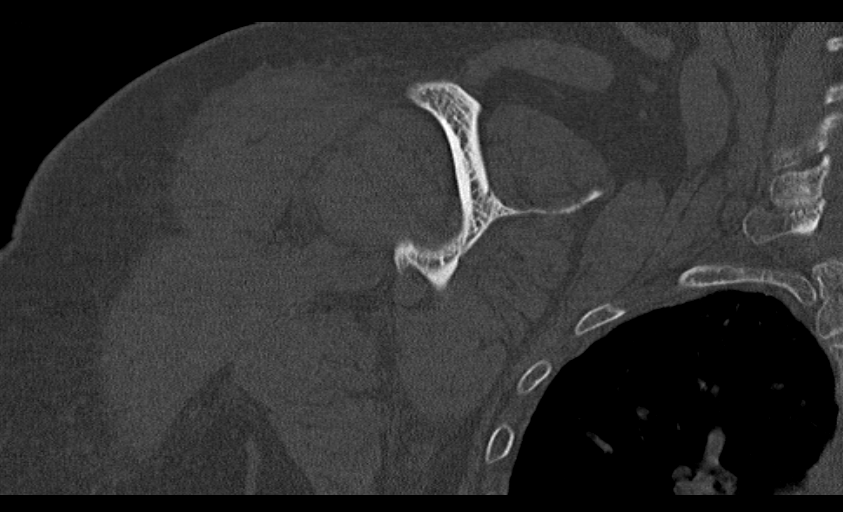

[Series 7: thins st · axial · 0.44mm/px · z∈[+898,+994]mm · 5 of 181 slices shown, 7 images]
[im 31/181  soft-tissue]
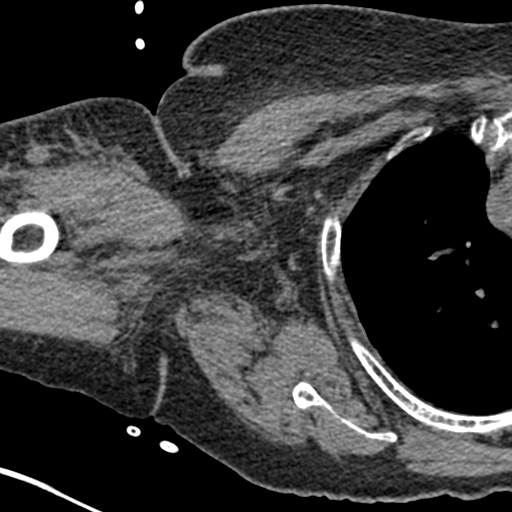
[im 31/181  bone]
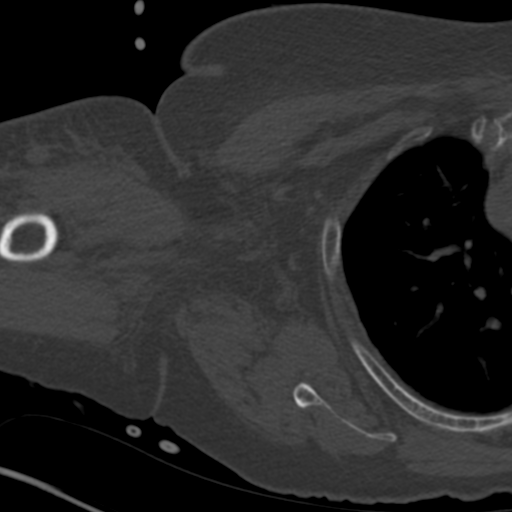
[im 61/181  bone]
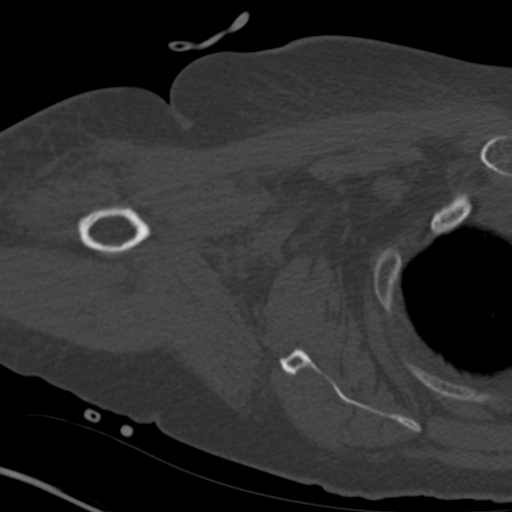
[im 91/181  bone]
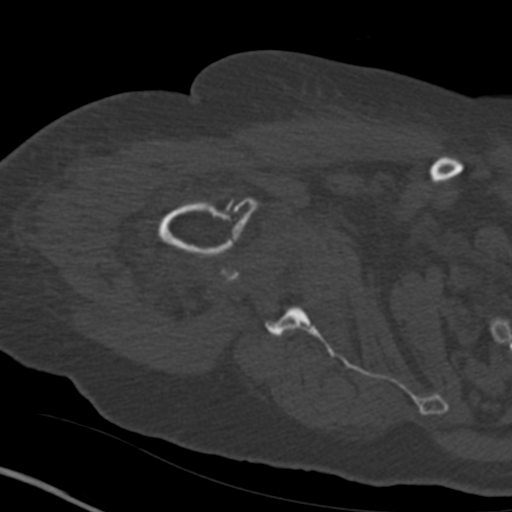
[im 121/181  bone]
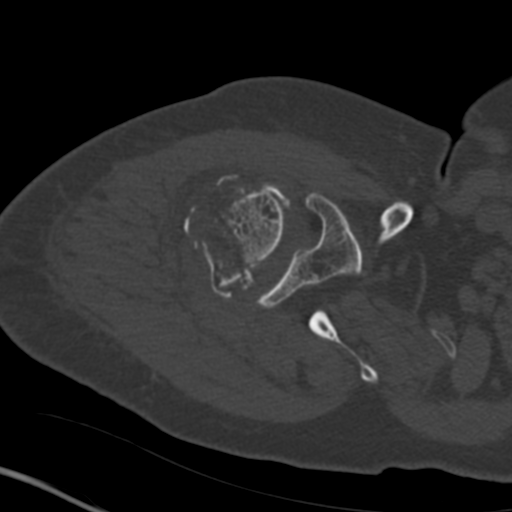
[im 151/181  soft-tissue]
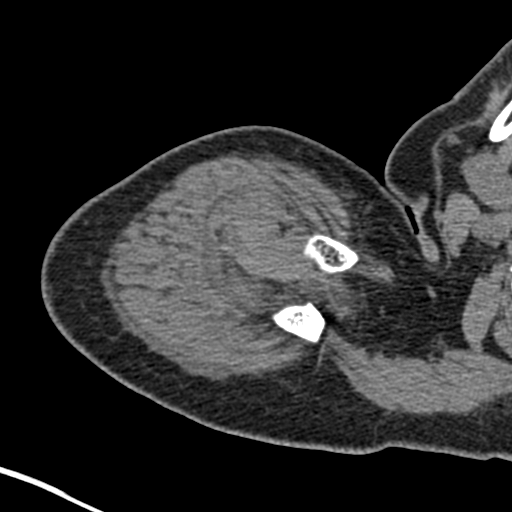
[im 151/181  bone]
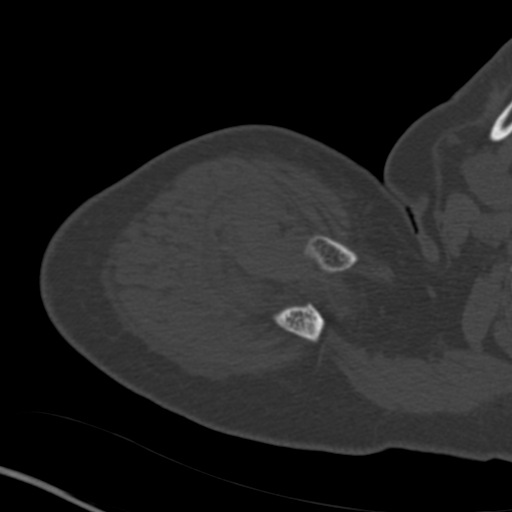

[8 of 20 positions shown; findings below may reference images not displayed]

FINDINGS: There is an extensively comminuted and displaced fracture of the
right humeral head and neck. The fracture involves the articular
surface of the humeral head posteriorly. There is displacement of
both tuberosities. There is no significant angulation of the main
fracture fragments.

There is subluxation of the humeral head with respect to the
glenoid, attributed to an associated hemarthrosis. There is soft
tissue swelling throughout the axilla and proximal upper arm. No
glenoid or other scapular fracture is demonstrated. The
acromioclavicular joint is intact.
IMPRESSION: Extensively comminuted and displaced intra-articular fracture of the
right humeral head and neck as described. Inferior subluxation of
the humeral head attributed to hemarthrosis. No evidence of glenoid
fracture.

## 2013-07-20 NOTE — Care Management Note (Signed)
CARE MANAGEMENT NOTE 07/20/2013  Patient:  [name], [name]   Account Number:  [id number]  Date Initiated:  07/20/2013  Documentation initiated by:  Ricki Miller  Subjective/Objective Assessment:   64 yr old female s/p IM Nailing right tibia,  right shoulder fx.     Action/Plan:   PT/OT eval  Case manager will follow   Anticipated DC Date:     Anticipated DC Plan:           Choice offered to / List presented to:             Status of service:  In process, will continue to follow

## 2013-07-20 NOTE — Evaluation (Addendum)
Physical Therapy Evaluation Patient Details Name: [name] MRN: [id number] DOB: [birth year] Today's Date: 07/20/2013 Time: 4854-6270 PT Time Calculation (min): 26 min  PT Assessment / Plan / Recommendation History of Present Illness  Pt is a 63 y/o female admitted s/p fall at home. She is now s/p IM nailing of R tibial/fibular fracture, and was also found to have a right shoulder fracture which is being treated non-surgically.   Clinical Impression  This patient presents with acute pain and decreased functional independence following the above mentioned procedure. At the time of PT eval, pt reports she feels she needs to go somewhere for rehab prior to returning home. Pt is very motivated to participate with therapy and states she wants to get back to her independent lifestyle as soon as possible!      PT Assessment  Patient needs continued PT services    Follow Up Recommendations  CIR    Does the patient have the potential to tolerate intense rehabilitation      Barriers to Discharge        Equipment Recommendations  Other (comment) (TBD by next venue of care)    Recommendations for Other Services     Frequency Min 4X/week    Precautions / Restrictions Precautions Precautions: Fall Required Braces or Orthoses: Other Brace/Splint Other Brace/Splint: Boot on RLE Restrictions Weight Bearing Restrictions: Yes RUE Weight Bearing: Non weight bearing RLE Weight Bearing: Touchdown weight bearing   Pertinent Vitals/Pain Pt reports 4/10 pain at rest. Pt positioned for comfort at end of session.      Mobility  Bed Mobility Overal bed mobility: Needs Assistance Bed Mobility: Supine to Sit Supine to sit: Mod assist;HOB elevated General bed mobility comments: VC's for sequencing and technique to maintain NWB through RUE, and transition to EOB. Mod assist required for trunk support and elevation to come to full sit, as well as for movement and support of RLE during transfer.   Transfers Overall transfer level: Needs assistance Equipment used: 2 person hand held assist Sit to Stand: Mod assist;+2 physical assistance Stand pivot transfers: Mod assist;Max assist;+2 physical assistance General transfer comment: VC's for hand placement on LUE on seated surface. +2 assist required to power up and come to full stand, as well as to pivot around to the recliner. VC's to maintain NWB status on the RLE and for safety awareness with the RW.  Ambulation/Gait General Gait Details: NT at this time    Exercises     PT Diagnosis: Difficulty walking;Acute pain  PT Problem List: Decreased range of motion;Decreased strength;Decreased activity tolerance;Decreased balance;Decreased mobility;Decreased knowledge of use of DME;Decreased safety awareness;Decreased knowledge of precautions;Pain PT Treatment Interventions: DME instruction;Gait training;Stair training;Functional mobility training;Therapeutic activities;Therapeutic exercise;Neuromuscular re-education;Patient/family education     PT Goals(Current goals can be found in the care plan section) Acute Rehab PT Goals Patient Stated Goal: To get back to independent lifestyle as soon as possible PT Goal Formulation: With patient Time For Goal Achievement: 08/03/13 Potential to Achieve Goals: Good  Visit Information  Last PT Received On: 07/20/13 Assistance Needed: +2 History of Present Illness: Pt is a 63 y/o female admitted s/p fall at home. She is now s/p Im nailing of R tibial/fibular fracture, and was also found to have a right shoulder fracture which is being non-surgically treated.        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 Dominant Hand: Left (Ambidextrous)    Cognition  Cognition Arousal/Alertness: Awake/alert Behavior During Therapy: WFL for tasks assessed/performed Overall Cognitive Status: Within Functional Limits for tasks assessed    Extremity/Trunk Assessment Upper Extremity Assessment Upper Extremity Assessment: RUE deficits/detail RUE Deficits / Details: Shoulder fracture Lower Extremity Assessment Lower Extremity Assessment: RLE deficits/detail RLE Deficits / Details: Decreased strength and AROM consistent with IM nailing of tibial/fibular fracture RLE: Unable to fully assess due to pain;Unable to fully assess due to immobilization Cervical / Trunk Assessment Cervical / Trunk Assessment: Kyphotic   Balance Balance Overall balance assessment: Needs assistance Sitting-balance support: Feet supported;Single extremity supported Sitting balance-Leahy Scale: Fair Standing balance support: Single extremity supported Standing balance-Leahy Scale: Fair  End of Session PT - End of Session Equipment Utilized During Treatment: Gait belt;Other (comment) (Boot) Activity Tolerance: Patient limited by fatigue Patient left: in chair;with call bell/phone within reach;with family/visitor present Nurse Communication: Mobility status  GP     Jolyn Lent 07/20/2013, 1:59 PM  Jolyn Lent, [place], DPT [phone]

## 2013-07-20 NOTE — Progress Notes (Signed)
   Subjective: 1 Day Post-Op Procedure(s) (LRB): INTRAMEDULLARY (IM) NAIL TIBIAL (Right) Patient reports pain as mild.   We will start therapy today.    Objective: Vital signs in last 24 hours: Temp:  [97.9 F (36.6 C)-99.6 F (37.6 C)] 98.4 F (36.9 C) (01/26 0601) Pulse Rate:  [52-66] 64 (01/26 0601) Resp:  [11-20] 16 (01/26 0601) BP: (129-167)/(61-106) 164/61 mmHg (01/26 0601) SpO2:  [84 %-97 %] 97 % (01/26 0601)  Intake/Output from previous day:  Intake/Output Summary (Last 24 hours) at 07/20/13 0702 Last data filed at 07/20/13 0552  Gross per 24 hour  Intake 3007.5 ml  Output   2150 ml  Net  857.5 ml    Intake/Output this shift:    Labs:  Recent Labs  07/18/13 1837 07/20/13 0535  HGB 11.4* 8.8*    Recent Labs  07/18/13 1837 07/20/13 0535  WBC 9.7 8.9  RBC 3.69* 2.87*  HCT 30.8* 23.9*  PLT 149* PENDING    Recent Labs  07/18/13 1837  NA 135*  K 4.0  CL 98  CO2 25  BUN 15  CREATININE 0.65  GLUCOSE 104*  CALCIUM 9.3    Recent Labs  07/18/13 1837  INR 1.10    EXAM General - Patient is Alert, Appropriate and Oriented Extremity - Neurologically intact Neurovascular intact Dressing - dressing C/D/I Motor Function - intact, moving foot and toes well on exam.    Past Medical History  Diagnosis Date  . Hypertension   . Thyroid disease     S/p Radiation  . ACROCHORDON 03/16/2010    Assessment/Plan: 1 Day Post-Op Procedure(s) (LRB): INTRAMEDULLARY (IM) NAIL TIBIAL (Right) Active Problems:   Tibia/fibula fracture   Advance diet Up with therapy I have contacted Dr. Veverly Fells who will evaluate the patient's shoulder and make surgical determination  DVT Prophylaxis - Lovenox TDWB to right leg   Mikenzi Raysor V 07/20/2013, 7:02 AM

## 2013-07-20 NOTE — Progress Notes (Signed)
   Subjective: 1 Day Post-Op Procedure(s) (LRB): INTRAMEDULLARY (IM) NAIL TIBIAL (Right)  Right proximal humerus fracture - currently in sling Pt c/o mild pain to right shoulder and right leg Denies any numbness or tingling distally Denies any previous problems with the right shoulder in the past Patient reports pain as mild.  Objective:   VITALS:   Filed Vitals:   07/20/13 0601  BP: 164/61  Pulse: 64  Temp: 98.4 F (36.9 C)  Resp: 16    Right shoulder: mild edema Currently in a sling nv intact distally No rom due to known fracture  LABS  Recent Labs  07/18/13 1837 07/20/13 0535  HGB 11.4* 8.8*  HCT 30.8* 23.9*  WBC 9.7 8.9  PLT 149* 94*     Recent Labs  07/18/13 1837 07/20/13 0535  NA 135* 135*  K 4.0 3.7  BUN 15 9  CREATININE 0.65 0.58  GLUCOSE 104* 121*     Assessment/Plan: 1 Day Post-Op Procedure(s) (LRB): INTRAMEDULLARY (IM) NAIL TIBIAL (Right) Acute Blood loss anemia - will continue to watch her progress but may need blood  Plan for surgery on Wednesday afternoon for the shoulder Discussed surgery and recovery with the pt and she is in agreement Continue non weight bearing right lower extremity Pain control as needed dvt prophylaxis     Merla Riches, MPAS, PA-C  07/20/2013, 9:27 AM

## 2013-07-20 NOTE — Progress Notes (Signed)
Utilization review complete 

## 2013-07-20 NOTE — Progress Notes (Signed)
Rehab Admissions Coordinator Note:  Patient was screened by Cleatrice Burke for appropriateness for an Inpatient Acute Rehab Consult.  At this time, we are recommending Inpatient Rehab consult.  Shruthi, [place] 07/20/2013, 6:13 PM  I can be reached at [phone].

## 2013-07-21 ENCOUNTER — Encounter (HOSPITAL_COMMUNITY): Payer: Self-pay | Admitting: Orthopedic Surgery

## 2013-07-21 DIAGNOSIS — W108XXA Fall (on) (from) other stairs and steps, initial encounter: Secondary | ICD-10-CM

## 2013-07-21 DIAGNOSIS — S82899A Other fracture of unspecified lower leg, initial encounter for closed fracture: Secondary | ICD-10-CM

## 2013-07-21 DIAGNOSIS — S42209A Unspecified fracture of upper end of unspecified humerus, initial encounter for closed fracture: Secondary | ICD-10-CM

## 2013-07-21 LAB — HEMOGLOBIN AND HEMATOCRIT, BLOOD
HCT: 28.8 % — ABNORMAL LOW (ref 36.0–46.0)
HEMOGLOBIN: 10.6 g/dL — AB (ref 12.0–15.0)

## 2013-07-21 LAB — PREPARE RBC (CROSSMATCH)

## 2013-07-21 LAB — CBC
HCT: 23.2 % — ABNORMAL LOW (ref 36.0–46.0)
Hemoglobin: 8.5 g/dL — ABNORMAL LOW (ref 12.0–15.0)
MCH: 30.8 pg (ref 26.0–34.0)
MCHC: 36.6 g/dL — AB (ref 30.0–36.0)
MCV: 84.1 fL (ref 78.0–100.0)
Platelets: 95 10*3/uL — ABNORMAL LOW (ref 150–400)
RBC: 2.76 MIL/uL — ABNORMAL LOW (ref 3.87–5.11)
RDW: 12.3 % (ref 11.5–15.5)
WBC: 8.5 10*3/uL (ref 4.0–10.5)

## 2013-07-21 LAB — BASIC METABOLIC PANEL
BUN: 8 mg/dL (ref 6–23)
CHLORIDE: 97 meq/L (ref 96–112)
CO2: 28 mEq/L (ref 19–32)
CREATININE: 0.51 mg/dL (ref 0.50–1.10)
Calcium: 8.4 mg/dL (ref 8.4–10.5)
GFR calc non Af Amer: >90 mL/min (ref >90)
GLUCOSE: 114 mg/dL — AB (ref 70–99)
Potassium: 3.8 mEq/L (ref 3.7–5.3)
Sodium: 136 mEq/L — ABNORMAL LOW (ref 137–147)

## 2013-07-21 MED ORDER — CEFAZOLIN SODIUM-DEXTROSE 2-3 GM-% IV SOLR
2.0000 g | INTRAVENOUS | Status: AC
  Filled 2013-07-21: qty 50

## 2013-07-21 MED ORDER — CHLORHEXIDINE GLUCONATE CLOTH 2 % EX PADS
6.0000 | MEDICATED_PAD | Freq: Once | CUTANEOUS | Status: AC
  Administered 2013-07-22: 6 via TOPICAL

## 2013-07-21 MED ORDER — ACETAMINOPHEN 325 MG PO TABS
650.0000 mg | ORAL_TABLET | Freq: Once | ORAL | Status: AC
  Administered 2013-07-21: 650 mg via ORAL
  Filled 2013-07-21: qty 2

## 2013-07-21 MED ORDER — CHLORHEXIDINE GLUCONATE 4 % EX LIQD
60.0000 mL | Freq: Once | CUTANEOUS | Status: AC
  Administered 2013-07-21: 4 via TOPICAL
  Filled 2013-07-21: qty 60

## 2013-07-21 NOTE — Progress Notes (Signed)
Orthopedics Progress Note  Subjective: My right shoulder hurts  Objective:  Filed Vitals:   07/21/13 1210  BP: 150/57  Pulse: 77  Temp: 99.4 F (37.4 C)  Resp: 18    General: Awake and alert  Musculoskeletal: right shoulder immob in sling, wiggles fingers Neurovascularly intact  Lab Results  Component Value Date   WBC 8.5 07/21/2013   HGB 8.5* 07/21/2013   HCT 23.2* 07/21/2013   MCV 84.1 07/21/2013   PLT 95* 07/21/2013       Component Value Date/Time   NA 136* 07/21/2013 0615   K 3.8 07/21/2013 0615   CL 97 07/21/2013 0615   CO2 28 07/21/2013 0615   GLUCOSE 114* 07/21/2013 0615   BUN 8 07/21/2013 0615   CREATININE 0.51 07/21/2013 0615   CREATININE 0.71 04/13/2013 0957   CALCIUM 8.4 07/21/2013 0615   GFRNONAA >90 07/21/2013 0615   GFRAA >90 07/21/2013 0615    Lab Results  Component Value Date   INR 1.10 07/18/2013   INR 0.99 03/27/2010    Assessment/Plan: S/p fall with right tibia fracture s/p IM nailing by Dr Maureen Ralphs.  Patient also with comminuted proximal humerus fracture with head split. Hemi-arthroplasty planned for tomorrow.  Npo after midnight. Acute blood loss anemia.  Transfusing in preparation for more blood loss and surgery tomorrow. Surgical plan explained to patient who agrees with proceeding.  Doran Heater. Veverly Fells, MD 07/21/2013 12:37 PM

## 2013-07-21 NOTE — Clinical Social Work Note (Signed)
Clinical Social Work Department BRIEF PSYCHOSOCIAL ASSESSMENT 07/21/2013  Patient:  [name], [name]     Account Number:  [id number]     [place] date:  07/18/2013  Clinical Social Worker:  Myles Lipps  Date/Time:  07/21/2013 02:30 PM  Referred by:  Physician  Date Referred:  07/21/2013 Referred for  SNF Placement   Other Referral:   Interview type:  Patient Other interview type:   No family currently at bedside    PSYCHOSOCIAL DATA Living Status:  FAMILY Admitted from facility:   Level of care:   Primary support name:  [name] [phone] / Shew,Amanda [phone] Primary support relationship to patient:  CHILD, ADULT Degree of support available:   Adequate    CURRENT CONCERNS Current Concerns  Post-Acute Placement  Other - See comment   Other Concerns:   Patient house is being foreclosed on and will be staying with her family in [place] at discharge    [place] / PLAN Clinical Social Worker met with patient at bedside to offer support and discuss patient plans at discharge.  Patient states that she was living at home with her daughter and her daughter's family prior to her fall, however the house is being foreclosed on and she will not be able to return at discharge.  Patient has made arrangements to go stay with family in [place], [place] following her rehab stay. Patient is hopeful for inpatient rehab but agreeable to SNF placement as needed.  Patient plans to discuss with her family about whether to stay in [place] area or look for facilities closer to [place] area.    Patient states that she did have [facility], however as of July 26, 2013 should be switching to new insurance policy.  Patient to get in touch with her daughter regarding insurance details.  Patient is scheduled to go to the OR tomorrow and has requested that no search be initiated until family is here tomorrow to decide.  Patient understands that if she were to accept  placement out of town she would be responsible for transportation.  CSW remains available for support and to facilitate patient discharge needs once medically stable.   Assessment/plan status:  Psychosocial Support/Ongoing Assessment of Needs Other assessment/ plan:   Information/referral to community resources:   Clinical Social Worker offered patient facility list, however she states that her family has been doing Nurse, children's and will bring preference facilities tomorrow to share.    PATIENT'S/FAMILY'S RESPONSE TO PLAN OF CARE: Patient alert and oriented x3 sitting up in the chair. Patient family seems to be able to provide adequate support at discharge per patient report.  Patient is adamant that her long term plan will be to stay with family in [place] area.  Patient is very pleasant and seems to be realistic regarding needs.  Patient verbalized her appreciation for CSW support and concern.

## 2013-07-21 NOTE — Consult Note (Signed)
Physical Medicine and Rehabilitation Consult  Reason for Consult:  Right tibia and right proximal humerus fracture.  Referring Physician: Dr. Wynelle Link.   HPI: [name] is a 63 y.o. female with history of cirrhosis, HTN, who fell while walking down steps and landed on her right side. Patient with immediate onset of She had immediate pain right shoulder and right lower leg. Denies any numbness, tingling in right arm or leg. She was admitted [facility] on 07/18/13 with right tib/fib fracture and right proximal humerus fracture. She was evaluated by Dr. Wynelle Link and underwent IM nailing of right tibia and right proximal humerus fracture treated with sling. She is TDWB on RLE with CAM walker at all times and NWB on RUE. ABLA noted with hgb-8.5. PT evaluation done yesterday and CIR recommended for follow up.    Review of Systems  Eyes: Positive for photophobia.    Past Medical History  Diagnosis Date  . Hypertension   . Thyroid disease     S/p Radiation  . ACROCHORDON 03/16/2010  . Hypothyroidism   . Liver cirrhosis    Past Surgical History  Procedure Laterality Date  . Im nailing tibia Right 07/19/2012    DR ALUSIO  . Tubal ligation     History reviewed. No pertinent family history.  Social History:  Married.  reports that she has been smoking Cigarettes.  She has a 40 pack-year smoking history. She has never used smokeless tobacco. She reports that she does not drink alcohol or use illicit drugs.   Allergies  Allergen Reactions  . Oxycodone Hcl     REACTION: hallucinations    Medications Prior to Admission  Medication Sig Dispense Refill  . atorvastatin (LIPITOR) 20 MG tablet Take 20 mg by mouth at bedtime.      . B Complex-C (B-COMPLEX WITH VITAMIN C) tablet Take 1 tablet by mouth daily.      . calcium-vitamin D (OSCAL WITH D) 500-200 MG-UNIT per tablet Take 1 tablet by mouth.      . citalopram (CELEXA) 20 MG tablet Take 20 mg by mouth daily.      . Flaxseed,  Linseed, (FLAX SEEDS PO) Take 1 tablet by mouth daily.      Marland Kitchen levothyroxine (SYNTHROID, LEVOTHROID) 125 MCG tablet Take 125 mcg by mouth at bedtime.      Marland Kitchen lisinopril-hydrochlorothiazide (PRINZIDE,ZESTORETIC) 20-25 MG per tablet Take 1 tablet by mouth daily.      . Melatonin 5 MG CAPS Take 1 capsule by mouth at bedtime as needed.      . Omega-3 Fatty Acids (FISH OIL) 300 MG CAPS Take 1 capsule by mouth daily.        Home: Home Living Family/patient expects to be discharged to:: Private residence Living Arrangements: Children Available Help at Discharge: Family;Available 24 hours/day Type of Home: House Home Access: Stairs to enter [employer] of Steps: 3 Entrance Stairs-Rails: Right;Left Home Layout: One level Home Equipment: Crutches;Shower seat;Wheelchair - manual;Grab bars - tub/shower  Functional History: Prior Function Comments: Still driving, retired Functional Status:  Mobility:     Ambulation/Gait General Gait Details: NT at this time    ADL:    Cognition: Cognition Overall Cognitive Status: Within Functional Limits for tasks assessed Orientation Level: Oriented X4 Cognition Arousal/Alertness: Awake/alert Behavior During Therapy: WFL for tasks assessed/performed Overall Cognitive Status: Within Functional Limits for tasks assessed  Blood pressure 144/76, pulse 68, temperature 98.1 F (36.7 C), temperature source Oral, resp. rate 19, height 5\' 3"  (  1.6 m), weight 85.73 kg (189 lb), SpO2 97.00%. Physical Exam  Constitutional: She is oriented to person, place, and time. She appears well-developed and well-nourished.  HENT:  Head: Normocephalic and atraumatic.  Eyes: Conjunctivae and EOM are normal. Pupils are equal, round, and reactive to light.  Neck: No thyromegaly present.  Cardiovascular: Normal rate.   Respiratory: Effort normal.  Musculoskeletal:  Right arm in sling, tender. RLE in ace wrap/splint. Right foot NVI  Neurological: She is alert  and oriented to person, place, and time. She has normal reflexes.  Psychiatric: She has a normal mood and affect. Her behavior is normal. Judgment and thought content normal.    Results for orders placed during the hospital encounter of 07/18/13 (from the past 24 hour(s))  BASIC METABOLIC PANEL     Status: Abnormal   Collection Time    07/21/13  6:15 AM      Result Value Range   Sodium 136 (*) 137 - 147 mEq/L   Potassium 3.8  3.7 - 5.3 mEq/L   Chloride 97  96 - 112 mEq/L   CO2 28  19 - 32 mEq/L   Glucose, Bld 114 (*) 70 - 99 mg/dL   BUN 8  6 - 23 mg/dL   Creatinine, Ser 0.51  0.50 - 1.10 mg/dL   Calcium 8.4  8.4 - 10.5 mg/dL   GFR calc non Af Amer >90  >90 mL/min   GFR calc Af Amer >90  >90 mL/min  CBC     Status: Abnormal   Collection Time    07/21/13  6:15 AM      Result Value Range   WBC 8.5  4.0 - 10.5 K/uL   RBC 2.76 (*) 3.87 - 5.11 MIL/uL   Hemoglobin 8.5 (*) 12.0 - 15.0 g/dL   HCT 23.2 (*) 36.0 - 46.0 %   MCV 84.1  78.0 - 100.0 fL   MCH 30.8  26.0 - 34.0 pg   MCHC 36.6 (*) 30.0 - 36.0 g/dL   RDW 12.3  11.5 - 15.5 %   Platelets 95 (*) 150 - 400 K/uL   Dg Tibia/fibula Right  07/19/2013   CLINICAL DATA:  Tibia fracture  EXAM: RIGHT TIBIA AND FIBULA - 2 VIEW  COMPARISON:  Yesterday  FINDINGS: An intra medullary rod transfixes a mid shaft tibia fracture. No breakage or loosening of the hardware. One proximal and 1 distal interlocking screw.  IMPRESSION: ORIF tibia fracture.   Electronically Signed   By: Maryclare Bean M.D.   On: 07/19/2013 14:25   Ct Shoulder Right Wo Contrast  07/20/2013   CLINICAL DATA:  Right shoulder pain status post fall. Evaluate proximal humeral fracture.  EXAM: CT OF THE RIGHT SHOULDER WITHOUT CONTRAST  TECHNIQUE: Multidetector CT imaging was performed according to the standard protocol. Multiplanar CT image reconstructions were also generated.  COMPARISON:  Right shoulder radiographs same date.  FINDINGS: There is an extensively comminuted and displaced  fracture of the right humeral head and neck. The fracture involves the articular surface of the humeral head posteriorly. There is displacement of both tuberosities. There is no significant angulation of the main fracture fragments.  There is subluxation of the humeral head with respect to the glenoid, attributed to an associated hemarthrosis. There is soft tissue swelling throughout the axilla and proximal upper arm. No glenoid or other scapular fracture is demonstrated. The acromioclavicular joint is intact.  IMPRESSION: Extensively comminuted and displaced intra-articular fracture of the right humeral head and neck as  described. Inferior subluxation of the humeral head attributed to hemarthrosis. No evidence of glenoid fracture.   Electronically Signed   By: Camie Patience M.D.   On: 07/20/2013 16:31    Assessment/Plan: Diagnosis: right proximal humerus fx and right tibial fx after fall 1. Does the need for close, 24 hr/day medical supervision in concert with the patient's rehab needs make it unreasonable for this patient to be served in a less intensive setting? Yes 2. Co-Morbidities requiring supervision/potential complications: htn, cirrhosis, abla 3. Due to bladder management, bowel management, safety, skin/wound care, disease management, medication administration, pain management and patient education, does the patient require 24 hr/day rehab nursing? Yes 4. Does the patient require coordinated care of a physician, rehab nurse, PT (1-2 hrs/day, 5 days/week) and OT (1-2 hrs/day, 5 days/week) to address physical and functional deficits in the context of the above medical diagnosis(es)? Yes Addressing deficits in the following areas: balance, endurance, locomotion, strength, transferring, bowel/bladder control, bathing, dressing, feeding, grooming, toileting and psychosocial support 5. Can the patient actively participate in an intensive therapy program of at least 3 hrs of therapy per day at least 5  days per week? Yes 6. The potential for patient to make measurable gains while on inpatient rehab is excellent 7. Anticipated functional outcomes upon discharge from inpatient rehab are mod I to supervision with PT, mod I to min assist with OT, n/a with SLP. 8. Estimated rehab length of stay to reach the above functional goals is: 10-14 days 9. Does the patient have adequate social supports to accommodate these discharge functional goals? Yes 10. Anticipated D/C setting: Home 11. Anticipated post D/C treatments: [place] therapy 12. Overall Rehab/Functional Prognosis: excellent  RECOMMENDATIONS: This patient's condition is appropriate for continued rehabilitative care in the following setting: CIR Patient has agreed to participate in recommended program. Yes Note that insurance prior authorization may be required for reimbursement for recommended care.  Comment: Pt is being evicted from her house. Will stay with family upon dc from hospital. Rehab Admissions Coordinator to follow up.  Thanks,  Meredith Staggers, MD, Mellody Drown     07/21/2013

## 2013-07-21 NOTE — Progress Notes (Signed)
Subjective: 2 Days Post-Op Procedure(s) (LRB): INTRAMEDULLARY (IM) NAIL TIBIAL (Right) Patient reports pain as mild and moderate.   Patient seen in rounds for Dr. Wynelle Link. Patient is well, but has had some minor complaints of pain in the shoulder and knee, requiring pain medications We will start therapy today.  Plan is to go SNF versus possible CIR after hospital stay.  Objective: Vital signs in last 24 hours: Temp:  [98.1 F (36.7 C)-100.1 F (37.8 C)] 98.1 F (36.7 C) (01/27 0500) Pulse Rate:  [66-68] 68 (01/27 0500) Resp:  [18-19] 19 (01/27 0500) BP: (144-156)/(56-76) 144/76 mmHg (01/27 0500) SpO2:  [95 %-97 %] 97 % (01/27 0500)  Intake/Output from previous day: 01/26 0701 - 01/27 0700 In: 480 [P.O.:480] Out: 2550 [Urine:2550]   Recent Labs  07/18/13 1837 07/20/13 0535 07/21/13 0615  HGB 11.4* 8.8* 8.5*    Recent Labs  07/20/13 0535 07/21/13 0615  WBC 8.9 8.5  RBC 2.87* 2.76*  HCT 23.9* 23.2*  PLT 94* 95*    Recent Labs  07/20/13 0535 07/21/13 0615  NA 135* 136*  K 3.7 3.8  CL 97 97  CO2 25 28  BUN 9 8  CREATININE 0.58 0.51  GLUCOSE 121* 114*  CALCIUM 8.1* 8.4    Recent Labs  07/18/13 1837  INR 1.10    EXAM General - Patient is Alert, Appropriate and Oriented Extremity - Neurovascular intact Sensation intact distally Dorsiflexion/Plantar flexion intact to right lower extremity Dressing - no drainage,  Boot removed and dressings changed.  Knee incision looks good, staples intact.  Small stab incision medical ankle looks good and staples intact. Motor Function - intact, moving foot and toes well on exam.   Past Medical History  Diagnosis Date  . Hypertension   . Thyroid disease     S/p Radiation  . [place] 03/16/2010  . Hypothyroidism   . Liver cirrhosis     Assessment/Plan: 2 Days Post-Op Procedure(s) (LRB): INTRAMEDULLARY (IM) NAIL TIBIAL (Right) Active Problems:   Tibia/fibula fracture  Estimated body mass index is 33.49  kg/(m^2) as calculated from the following:   Height as of this encounter: 5\' 3"  (1.6 m).   Weight as of this encounter: 85.73 kg (189 lb). Discharge to SNF versus CIR  DVT Prophylaxis - Lovenox TDWB only to right leg CAM walker at all times  Patient for likely shoulder surgery tomorrow as per Dr. Veverly Fells. Patient currently on Lovenox.  [name], [name] 07/21/2013, 8:09 AM

## 2013-07-21 NOTE — Care Management Note (Signed)
CARE MANAGEMENT NOTE 07/21/2013  Patient:  [name], [name]   Account Number:  [id number]  Date Initiated:  07/20/2013  Documentation initiated by:  Ricki Miller  Subjective/Objective Assessment:   63 yr old female s/p IM Nailing right tibia,  right shoulder fx.     Action/Plan:   PT/OT eval  Case manager will follow.Patient to have Hemiarthroplasty on 07/22/13. Patient may go to CIR vs SNF for Rehab.   Anticipated DC Date:     Anticipated DC Plan:           Choice offered to / List presented to:             Status of service:  In process, will continue to follow

## 2013-07-21 NOTE — Progress Notes (Signed)
Physical Therapy Treatment Patient Details Name: [name] MRN: [id number] DOB: 1951/02/10 Today's Date: 07/21/2013 Time: 1130-1150 PT Time Calculation (min): 20 min  PT Assessment / Plan / Recommendation  History of Present Illness Pt is a 63 y/o female admitted s/p fall at home. She is now s/p Im nailing of R tibial/fibular fracture, and was also found to have a right shoulder fracture which is being non-surgically treated.    PT Comments   Focus of today's treatment session was therapeutic exercise and strengthening of LE's, as pt about to receive blood transfusion. Pt showing progress with ability to actively move the RLE, and required assistance with only some of the exercises. Pt reports she wants to try for more reps tomorrow. Therapist adjusted sling, and positioned RUE and RLE in chair prior to session ending. Continue to recommend CIR at d/c.   Follow Up Recommendations  CIR     Does the patient have the potential to tolerate intense rehabilitation     Barriers to Discharge        Equipment Recommendations  Other (comment) (TBD by next venue of care)    Recommendations for Other Services    Frequency Min 4X/week   Progress towards PT Goals Progress towards PT goals: Progressing toward goals  Plan Current plan remains appropriate    Precautions / Restrictions Precautions Precautions: Fall Required Braces or Orthoses: Other Brace/Splint Other Brace/Splint: Boot on RLE Restrictions Weight Bearing Restrictions: Yes RUE Weight Bearing: Non weight bearing RLE Weight Bearing: Touchdown weight bearing   Pertinent Vitals/Pain Pt reports improvement in pain compared to yesterday. Not rated on 0-10 scale.     Mobility  Bed Mobility General bed mobility comments: NT - Pt received up in chair Transfers General transfer comment: NT    Exercises General Exercises - Lower Extremity Ankle Circles/Pumps: 10 reps Quad Sets: 10 reps Short Arc Quad: 10 reps Long Arc  Quad: 10 reps;AROM Hip ABduction/ADduction: 10 reps Straight Leg Raises: 10 reps;AAROM   PT Diagnosis:    PT Problem List:   PT Treatment Interventions:     PT Goals (current goals can now be found in the care plan section) Acute Rehab PT Goals Patient Stated Goal: To get back to independent lifestyle as soon as possible PT Goal Formulation: With patient Time For Goal Achievement: 08/03/13 Potential to Achieve Goals: Good  Visit Information  Last PT Received On: 07/21/13 Assistance Needed: +2 History of Present Illness: Pt is a 63 y/o female admitted s/p fall at home. She is now s/p Im nailing of R tibial/fibular fracture, and was also found to have a right shoulder fracture which is being non-surgically treated.     Subjective Data  Subjective: "Tomorrow we will try for more." Patient Stated Goal: To get back to independent lifestyle as soon as possible   Cognition  Cognition Arousal/Alertness: Awake/alert Behavior During Therapy: WFL for tasks assessed/performed Overall Cognitive Status: Within Functional Limits for tasks assessed    Balance     End of Session PT - End of Session Activity Tolerance: Patient tolerated treatment well Patient left: in chair;with call bell/phone within reach;with family/visitor present Nurse Communication: Mobility status   GP     Jolyn Lent 07/21/2013, 12:12 PM  Jolyn Lent, [place], DPT [phone]

## 2013-07-22 ENCOUNTER — Encounter (HOSPITAL_COMMUNITY)
Admission: EM | Disposition: A | Discharge status: Skilled Nursing Facility | Payer: Self-pay | Source: Home / Self Care | Attending: Orthopedic Surgery

## 2013-07-22 ENCOUNTER — Inpatient Hospital Stay (HOSPITAL_COMMUNITY): Payer: Medicare HMO

## 2013-07-22 ENCOUNTER — Inpatient Hospital Stay (HOSPITAL_COMMUNITY): Payer: Medicare HMO | Admitting: Certified Registered Nurse Anesthetist

## 2013-07-22 ENCOUNTER — Encounter (HOSPITAL_COMMUNITY): Payer: Medicare HMO | Admitting: Certified Registered Nurse Anesthetist

## 2013-07-22 ENCOUNTER — Encounter (HOSPITAL_COMMUNITY): Payer: Self-pay | Admitting: Certified Registered Nurse Anesthetist

## 2013-07-22 DIAGNOSIS — S42201A Unspecified fracture of upper end of right humerus, initial encounter for closed fracture: Secondary | ICD-10-CM | POA: Diagnosis present

## 2013-07-22 HISTORY — PX: SHOULDER HEMI-ARTHROPLASTY: SHX5049

## 2013-07-22 LAB — TYPE AND SCREEN
ABO/RH(D): A POS
ANTIBODY SCREEN: NEGATIVE
UNIT DIVISION: 0
Unit division: 0

## 2013-07-22 LAB — CBC
HEMATOCRIT: 28.3 % — AB (ref 36.0–46.0)
HEMOGLOBIN: 10.4 g/dL — AB (ref 12.0–15.0)
MCH: 31.1 pg (ref 26.0–34.0)
MCHC: 36.7 g/dL — ABNORMAL HIGH (ref 30.0–36.0)
MCV: 84.7 fL (ref 78.0–100.0)
Platelets: 101 10*3/uL — ABNORMAL LOW (ref 150–400)
RBC: 3.34 MIL/uL — ABNORMAL LOW (ref 3.87–5.11)
RDW: 13 % (ref 11.5–15.5)
WBC: 10.9 10*3/uL — ABNORMAL HIGH (ref 4.0–10.5)

## 2013-07-22 LAB — CREATININE, SERUM: Creatinine, Ser: 0.53 mg/dL (ref 0.50–1.10)

## 2013-07-22 IMAGING — DX DG SHOULDER 2+V*R*
1 series · 1 of 1 positions shown · non-contrast
Comparison: [DATE], [DATE]

CLINICAL DATA: Right humeral head fracture

EXAM:
RIGHT SHOULDER - 2+ VIEW

[ap]
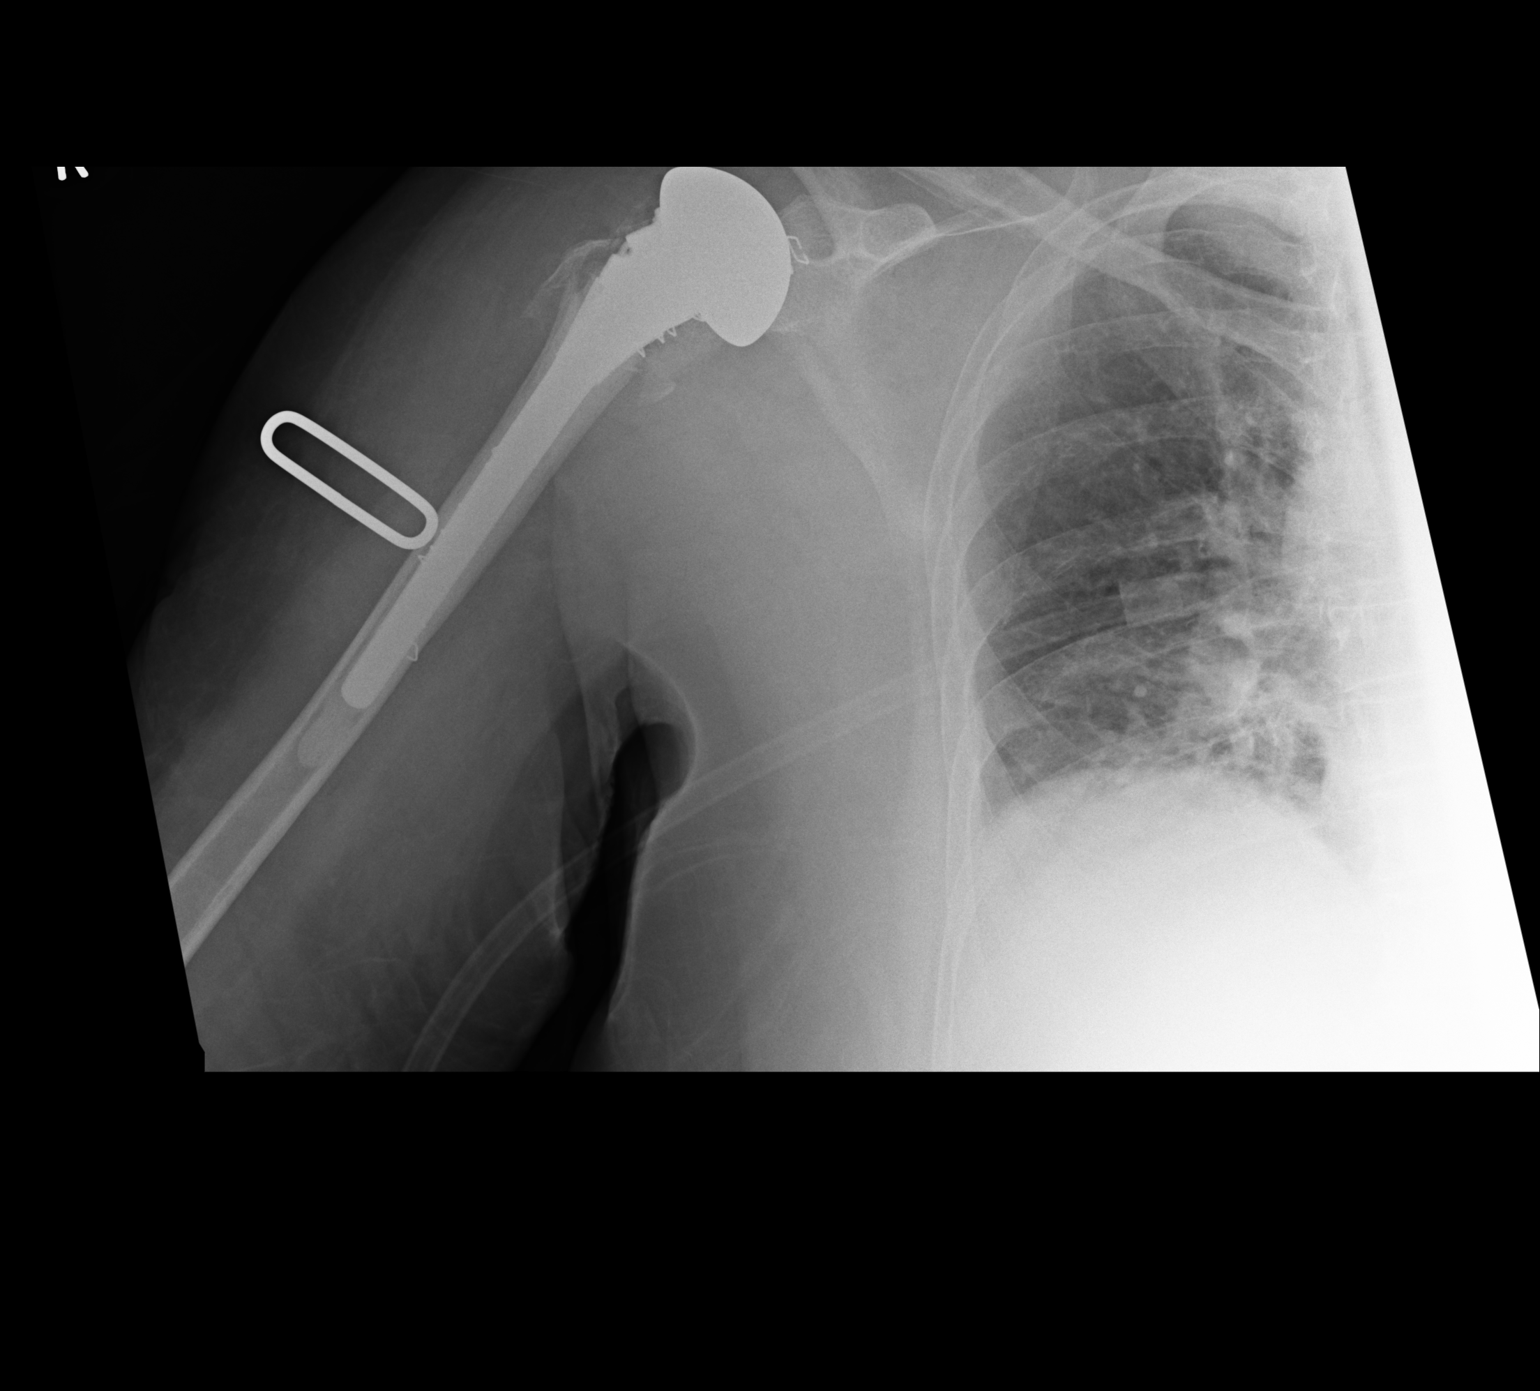

[1 of 1 positions shown; findings below may reference images not displayed]

FINDINGS: Right shoulder hemiarthroplasty has been performed. Grossly normal
alignment. AC joint also aligned. Small residual fracture fragments
noted about the proximal humerus hardware. No hardware abnormality.
IMPRESSION: Status post right shoulder arthroplasty.  No complicating feature

## 2013-07-22 SURGERY — HEMIARTHROPLASTY, SHOULDER
Anesthesia: Regional | Site: Shoulder | Laterality: Right

## 2013-07-22 MED ORDER — OXYCODONE HCL 5 MG/5ML PO SOLN: 5.0000 mg | Freq: Once | ORAL | Status: DC | PRN

## 2013-07-22 MED ORDER — LACTATED RINGERS IV SOLN: INTRAVENOUS | Status: DC

## 2013-07-22 MED ORDER — FENTANYL CITRATE 0.05 MG/ML IJ SOLN
INTRAMUSCULAR | Status: AC
  Filled 2013-07-22: qty 5

## 2013-07-22 MED ORDER — ROCURONIUM BROMIDE 50 MG/5ML IV SOLN
INTRAVENOUS | Status: AC
  Filled 2013-07-22: qty 1

## 2013-07-22 MED ORDER — ROCURONIUM BROMIDE 100 MG/10ML IV SOLN
INTRAVENOUS | Status: DC | PRN
  Administered 2013-07-22: 30 mg via INTRAVENOUS
  Administered 2013-07-22: 20 mg via INTRAVENOUS
  Administered 2013-07-22: 10 mg via INTRAVENOUS

## 2013-07-22 MED ORDER — BUPIVACAINE-EPINEPHRINE PF 0.25-1:200000 % IJ SOLN
INTRAMUSCULAR | Status: DC | PRN
  Administered 2013-07-22: 10 mL

## 2013-07-22 MED ORDER — ACETAMINOPHEN 325 MG PO TABS: 650.0000 mg | ORAL_TABLET | Freq: Four times a day (QID) | ORAL | Status: DC | PRN

## 2013-07-22 MED ORDER — MORPHINE SULFATE 2 MG/ML IJ SOLN: 2.0000 mg | INTRAMUSCULAR | Status: DC | PRN

## 2013-07-22 MED ORDER — ONDANSETRON HCL 4 MG/2ML IJ SOLN
INTRAMUSCULAR | Status: AC
  Filled 2013-07-22: qty 2

## 2013-07-22 MED ORDER — METOCLOPRAMIDE HCL 10 MG PO TABS
5.0000 mg | ORAL_TABLET | Freq: Three times a day (TID) | ORAL | Status: DC | PRN
Start: 2013-07-22 — End: 2013-07-24

## 2013-07-22 MED ORDER — ONDANSETRON HCL 4 MG/2ML IJ SOLN: 4.0000 mg | Freq: Four times a day (QID) | INTRAMUSCULAR | Status: DC | PRN

## 2013-07-22 MED ORDER — FENTANYL CITRATE 0.05 MG/ML IJ SOLN
INTRAMUSCULAR | Status: DC | PRN
  Administered 2013-07-22: 50 ug via INTRAVENOUS

## 2013-07-22 MED ORDER — METHOCARBAMOL 500 MG PO TABS
ORAL_TABLET | ORAL | Status: AC
  Filled 2013-07-22: qty 1

## 2013-07-22 MED ORDER — LIDOCAINE HCL (CARDIAC) 20 MG/ML IV SOLN
INTRAVENOUS | Status: DC | PRN
  Administered 2013-07-22: 80 mg via INTRAVENOUS

## 2013-07-22 MED ORDER — LIDOCAINE HCL (CARDIAC) 20 MG/ML IV SOLN
INTRAVENOUS | Status: AC
  Filled 2013-07-22: qty 5

## 2013-07-22 MED ORDER — SODIUM CHLORIDE 0.9 % IV SOLN
INTRAVENOUS | Status: DC
  Administered 2013-07-23: 02:00:00 via INTRAVENOUS

## 2013-07-22 MED ORDER — CEFAZOLIN SODIUM-DEXTROSE 2-3 GM-% IV SOLR
2.0000 g | Freq: Four times a day (QID) | INTRAVENOUS | Status: AC
  Administered 2013-07-22 – 2013-07-23 (×3): 2 g via INTRAVENOUS
  Filled 2013-07-22 (×3): qty 50

## 2013-07-22 MED ORDER — HYDROCODONE-ACETAMINOPHEN 5-325 MG PO TABS
1.0000 | ORAL_TABLET | ORAL | Status: DC | PRN
  Administered 2013-07-22 – 2013-07-23 (×6): 2 via ORAL
  Filled 2013-07-22 (×7): qty 2

## 2013-07-22 MED ORDER — NEOSTIGMINE METHYLSULFATE 1 MG/ML IJ SOLN
INTRAMUSCULAR | Status: AC
  Filled 2013-07-22: qty 10

## 2013-07-22 MED ORDER — 0.9 % SODIUM CHLORIDE (POUR BTL) OPTIME
TOPICAL | Status: DC | PRN
  Administered 2013-07-22: 1000 mL

## 2013-07-22 MED ORDER — FENTANYL CITRATE 0.05 MG/ML IJ SOLN
INTRAMUSCULAR | Status: AC
Start: 2013-07-22 — End: 2013-07-22
  Filled 2013-07-22: qty 5

## 2013-07-22 MED ORDER — CEFAZOLIN SODIUM-DEXTROSE 2-3 GM-% IV SOLR
INTRAVENOUS | Status: DC | PRN
  Administered 2013-07-22: 2 g via INTRAVENOUS

## 2013-07-22 MED ORDER — GLYCOPYRROLATE 0.2 MG/ML IJ SOLN
INTRAMUSCULAR | Status: AC
  Filled 2013-07-22: qty 3

## 2013-07-22 MED ORDER — MIDAZOLAM HCL 5 MG/5ML IJ SOLN
INTRAMUSCULAR | Status: DC | PRN
  Administered 2013-07-22 (×2): 1 mg via INTRAVENOUS

## 2013-07-22 MED ORDER — EPHEDRINE SULFATE 50 MG/ML IJ SOLN
INTRAMUSCULAR | Status: AC
  Filled 2013-07-22: qty 1

## 2013-07-22 MED ORDER — SUCCINYLCHOLINE CHLORIDE 20 MG/ML IJ SOLN
INTRAMUSCULAR | Status: AC
  Filled 2013-07-22: qty 1

## 2013-07-22 MED ORDER — PROPOFOL 10 MG/ML IV BOLUS
INTRAVENOUS | Status: DC | PRN
  Administered 2013-07-22: 150 mg via INTRAVENOUS

## 2013-07-22 MED ORDER — GLYCOPYRROLATE 0.2 MG/ML IJ SOLN
INTRAMUSCULAR | Status: DC | PRN
  Administered 2013-07-22: 0.6 mg via INTRAVENOUS

## 2013-07-22 MED ORDER — BUPIVACAINE-EPINEPHRINE PF 0.5-1:200000 % IJ SOLN
INTRAMUSCULAR | Status: DC | PRN
  Administered 2013-07-22: 25 mL via PERINEURAL

## 2013-07-22 MED ORDER — PHENYLEPHRINE HCL 10 MG/ML IJ SOLN
INTRAMUSCULAR | Status: AC
  Filled 2013-07-22: qty 1

## 2013-07-22 MED ORDER — ARTIFICIAL TEARS OP OINT
TOPICAL_OINTMENT | OPHTHALMIC | Status: AC
  Filled 2013-07-22: qty 3.5

## 2013-07-22 MED ORDER — ONDANSETRON HCL 4 MG PO TABS: 4.0000 mg | ORAL_TABLET | Freq: Four times a day (QID) | ORAL | Status: DC | PRN

## 2013-07-22 MED ORDER — BISACODYL 10 MG RE SUPP: 10.0000 mg | Freq: Every day | RECTAL | Status: DC | PRN

## 2013-07-22 MED ORDER — METHOCARBAMOL 500 MG PO TABS
500.0000 mg | ORAL_TABLET | Freq: Four times a day (QID) | ORAL | Status: DC
  Administered 2013-07-22 – 2013-07-24 (×6): 500 mg via ORAL
  Filled 2013-07-22 (×14): qty 1

## 2013-07-22 MED ORDER — DEXTROSE 5 % IV SOLN
500.0000 mg | Freq: Four times a day (QID) | INTRAVENOUS | Status: DC
  Filled 2013-07-22 (×11): qty 5

## 2013-07-22 MED ORDER — HYDROMORPHONE HCL PF 1 MG/ML IJ SOLN: 0.2500 mg | INTRAMUSCULAR | Status: DC | PRN

## 2013-07-22 MED ORDER — SUCCINYLCHOLINE CHLORIDE 20 MG/ML IJ SOLN
INTRAMUSCULAR | Status: DC | PRN
  Administered 2013-07-22: 100 mg via INTRAVENOUS

## 2013-07-22 MED ORDER — PROPOFOL 10 MG/ML IV BOLUS
INTRAVENOUS | Status: AC
  Filled 2013-07-22: qty 20

## 2013-07-22 MED ORDER — MIDAZOLAM HCL 2 MG/2ML IJ SOLN
INTRAMUSCULAR | Status: AC
  Filled 2013-07-22: qty 2

## 2013-07-22 MED ORDER — NEOSTIGMINE METHYLSULFATE 1 MG/ML IJ SOLN
INTRAMUSCULAR | Status: DC | PRN
  Administered 2013-07-22: 4 mg via INTRAVENOUS

## 2013-07-22 MED ORDER — PHENOL 1.4 % MT LIQD: 1.0000 | OROMUCOSAL | Status: DC | PRN

## 2013-07-22 MED ORDER — MENTHOL 3 MG MT LOZG: 1.0000 | LOZENGE | OROMUCOSAL | Status: DC | PRN

## 2013-07-22 MED ORDER — ACETAMINOPHEN 650 MG RE SUPP: 650.0000 mg | Freq: Four times a day (QID) | RECTAL | Status: DC | PRN

## 2013-07-22 MED ORDER — ONDANSETRON HCL 4 MG/2ML IJ SOLN: 4.0000 mg | Freq: Once | INTRAMUSCULAR | Status: DC | PRN

## 2013-07-22 MED ORDER — METOCLOPRAMIDE HCL 5 MG/ML IJ SOLN: 5.0000 mg | Freq: Three times a day (TID) | INTRAMUSCULAR | Status: DC | PRN

## 2013-07-22 MED ORDER — LACTATED RINGERS IV SOLN
INTRAVENOUS | Status: DC | PRN
  Administered 2013-07-22 (×2): via INTRAVENOUS

## 2013-07-22 MED ORDER — PHENYLEPHRINE HCL 10 MG/ML IJ SOLN
10.0000 mg | INTRAVENOUS | Status: DC | PRN
  Administered 2013-07-22: 25 ug/min via INTRAVENOUS

## 2013-07-22 MED ORDER — ONDANSETRON HCL 4 MG/2ML IJ SOLN
INTRAMUSCULAR | Status: DC | PRN
  Administered 2013-07-22: 4 mg via INTRAVENOUS

## 2013-07-22 MED ORDER — OXYCODONE HCL 5 MG PO TABS: 5.0000 mg | ORAL_TABLET | Freq: Once | ORAL | Status: DC | PRN

## 2013-07-22 MED ORDER — BUPIVACAINE-EPINEPHRINE (PF) 0.25% -1:200000 IJ SOLN
INTRAMUSCULAR | Status: AC
  Filled 2013-07-22: qty 30

## 2013-07-22 MED ORDER — ENOXAPARIN SODIUM 40 MG/0.4ML ~~LOC~~ SOLN
40.0000 mg | SUBCUTANEOUS | Status: DC
  Administered 2013-07-23 – 2013-07-24 (×2): 40 mg via SUBCUTANEOUS
  Filled 2013-07-22 (×2): qty 0.4

## 2013-07-22 SURGICAL SUPPLY — 61 items
BLADE SAW SAG 73X25 THK (BLADE) ×1
BLADE SAW SGTL 73X25 THK (BLADE) ×1 IMPLANT
CEMENT HV SMART SET (Cement) ×2 IMPLANT
CLOTH BEACON ORANGE TIMEOUT ST (SAFETY) ×2 IMPLANT
COVER SURGICAL LIGHT HANDLE (MISCELLANEOUS) ×2 IMPLANT
DRAPE INCISE IOBAN 66X45 STRL (DRAPES) ×4 IMPLANT
DRAPE U-SHAPE 47X51 STRL (DRAPES) ×2 IMPLANT
DRILL BIT 5/64 (BIT) ×2 IMPLANT
DRSG ADAPTIC 3X8 NADH LF (GAUZE/BANDAGES/DRESSINGS) ×2 IMPLANT
DRSG PAD ABDOMINAL 8X10 ST (GAUZE/BANDAGES/DRESSINGS) ×3 IMPLANT
DURAPREP 26ML APPLICATOR (WOUND CARE) ×2 IMPLANT
ELECT NDL TIP 2.8 STRL (NEEDLE) ×1 IMPLANT
ELECT NEEDLE TIP 2.8 STRL (NEEDLE) ×2 IMPLANT
ELECT REM PT RETURN 9FT ADLT (ELECTROSURGICAL) ×2
ELECTRODE REM PT RTRN 9FT ADLT (ELECTROSURGICAL) ×1 IMPLANT
EPIPHYSIS SHOULD BODYSIZE 10-5 (Knees) ×1 IMPLANT
GLOVE BIOGEL PI IND STRL 7.0 (GLOVE) IMPLANT
GLOVE BIOGEL PI INDICATOR 7.0 (GLOVE) ×3
GLOVE BIOGEL PI ORTHO PRO 7.5 (GLOVE) ×1
GLOVE BIOGEL PI ORTHO PRO SZ8 (GLOVE) ×2
GLOVE ORTHO TXT STRL SZ7.5 (GLOVE) ×2 IMPLANT
GLOVE PI ORTHO PRO STRL 7.5 (GLOVE) ×1 IMPLANT
GLOVE PI ORTHO PRO STRL SZ8 (GLOVE) ×1 IMPLANT
GLOVE SS BIOGEL STRL SZ 7 (GLOVE) IMPLANT
GLOVE SUPERSENSE BIOGEL SZ 7 (GLOVE) ×2
GLOVE SURG ORTHO 8.5 STRL (GLOVE) ×2 IMPLANT
GOWN STRL REIN XL XLG (GOWN DISPOSABLE) ×4 IMPLANT
HEAD HUMERAL STD 44X18M SHLDR (Head) ×1 IMPLANT
KIT BASIN OR (CUSTOM PROCEDURE TRAY) ×2 IMPLANT
KIT ROOM TURNOVER OR (KITS) ×2 IMPLANT
MANIFOLD NEPTUNE II (INSTRUMENTS) ×2 IMPLANT
NDL HYPO 25GX1X1/2 BEV (NEEDLE) ×1 IMPLANT
NDL SUT .5 MAYO 1.404X.05X (NEEDLE) ×1 IMPLANT
NEEDLE HYPO 25GX1X1/2 BEV (NEEDLE) ×2 IMPLANT
NEEDLE MAYO TAPER (NEEDLE) ×2
NS IRRIG 1000ML POUR BTL (IV SOLUTION) ×2 IMPLANT
PACK SHOULDER (CUSTOM PROCEDURE TRAY) ×2 IMPLANT
PAD ARMBOARD 7.5X6 YLW CONV (MISCELLANEOUS) ×4 IMPLANT
SLING ARM IMMOBILIZER LRG (SOFTGOODS) ×1 IMPLANT
SLING ARM IMMOBILIZER MED (SOFTGOODS) IMPLANT
SLING ARM LRG ADULT FOAM STRAP (SOFTGOODS) ×1 IMPLANT
SPONGE GAUZE 4X4 12PLY (GAUZE/BANDAGES/DRESSINGS) ×2 IMPLANT
SPONGE GAUZE 4X4 12PLY STER LF (GAUZE/BANDAGES/DRESSINGS) ×1 IMPLANT
SPONGE LAP 18X18 X RAY DECT (DISPOSABLE) ×2 IMPLANT
STEM STANDARD SZ 10 113MM (Stem) ×2 IMPLANT
STEM STD SZ 10 113MM (Stem) IMPLANT
STRIP CLOSURE SKIN 1/2X4 (GAUZE/BANDAGES/DRESSINGS) ×2 IMPLANT
SUCTION FRAZIER TIP 10 FR DISP (SUCTIONS) ×2 IMPLANT
SUT FIBERWIRE #2 38 T-5 BLUE (SUTURE) ×4
SUT MNCRL AB 4-0 PS2 18 (SUTURE) ×2 IMPLANT
SUT VIC AB 0 CT1 27 (SUTURE) ×2
SUT VIC AB 0 CT1 27XBRD ANBCTR (SUTURE) ×1 IMPLANT
SUT VIC AB 2-0 CT1 27 (SUTURE) ×2
SUT VIC AB 2-0 CT1 TAPERPNT 27 (SUTURE) ×1 IMPLANT
SUTURE FIBERWR #2 38 T-5 BLUE (SUTURE) ×2 IMPLANT
SYR CONTROL 10ML LL (SYRINGE) ×2 IMPLANT
TOWEL OR 17X24 6PK STRL BLUE (TOWEL DISPOSABLE) ×2 IMPLANT
TOWEL OR 17X26 10 PK STRL BLUE (TOWEL DISPOSABLE) ×2 IMPLANT
TOWER CARTRIDGE SMART MIX (DISPOSABLE) ×2 IMPLANT
TRAY FOLEY CATH 16FRSI W/METER (SET/KITS/TRAYS/PACK) IMPLANT
WATER STERILE IRR 1000ML POUR (IV SOLUTION) ×2 IMPLANT

## 2013-07-22 NOTE — Transfer of Care (Signed)
Immediate Anesthesia Transfer of Care Note  Patient: [name]  Procedure(s) Performed: Procedure(s): RIGHT SHOULDER HEMI-ARTHROPLASTY (Right)  Patient Location: PACU  Anesthesia Type:GA combined with regional for post-op pain  Level of Consciousness: awake, alert  and oriented  Airway & Oxygen Therapy: Patient Spontanous Breathing and Patient connected to nasal cannula oxygen  Post-op Assessment: Report given to PACU RN and Post -op Vital signs reviewed and stable  Post vital signs: Reviewed and stable  Complications: No apparent anesthesia complications

## 2013-07-22 NOTE — Anesthesia Preprocedure Evaluation (Addendum)
Anesthesia Evaluation  Patient identified by MRN, date of birth, ID band Patient awake    Reviewed: Allergy & Precautions, H&P , NPO status , Patient's Chart, lab work & pertinent test results  Airway Mallampati: II TM Distance: >3 FB Neck ROM: Full    Dental  (+) Teeth Intact and Dental Advisory Given   Pulmonary Current Smoker,  breath sounds clear to auscultation        Cardiovascular hypertension, Pt. on medications Rhythm:Regular Rate:Normal     Neuro/Psych    GI/Hepatic negative GI ROS, Neg liver ROS,   Endo/Other  Hypothyroidism Morbid obesity  Renal/GU negative Renal ROS     Musculoskeletal   Abdominal (+) + obese,   Peds  Hematology   Anesthesia Other Findings   Reproductive/Obstetrics                          Anesthesia Physical Anesthesia Plan  ASA: II  Anesthesia Plan: General and Regional   Post-op Pain Management:    Induction: Intravenous  Airway Management Planned: Oral ETT and Video Laryngoscope Planned  Additional Equipment:   Intra-op Plan:   Post-operative Plan: Extubation in OR  Informed Consent: I have reviewed the patients History and Physical, chart, labs and discussed the procedure including the risks, benefits and alternatives for the proposed anesthesia with the patient or authorized representative who has indicated his/her understanding and acceptance.   Dental advisory given  Plan Discussed with: Anesthesiologist, Surgeon and CRNA  Anesthesia Plan Comments:        Anesthesia Quick Evaluation

## 2013-07-22 NOTE — Progress Notes (Signed)
Rehab admissions - Evaluated for possible admission.  Patient is currently down in the OR for right shoulder surgery.  I called and spoke with the on site insurance reviewer, Rema Fendt, to let her know of possible need for acute inpatient rehab admission.  I will follow up again tomorrow.  Will need PT and OT orders post op please.  Call me for questions.  #[phone]

## 2013-07-22 NOTE — Brief Op Note (Signed)
07/18/2013 - 07/22/2013  4:28 PM  PATIENT:  [name]  63 y.o. female  PRE-OPERATIVE DIAGNOSIS:  RIGHT SHOULDER FRACTURE, DISPLACED 4 PART PROXIMAL HUMERUS FX  POST-OPERATIVE DIAGNOSIS:  RIGHT SHOULDER FRACTURE, DISPLACED 4 PART PROXIMAL HUMERUS FX  PROCEDURE:  Procedure(s): RIGHT SHOULDER HEMI-ARTHROPLASTY (Right), [employer]  SURGEON:  Surgeon(s) and Role:    * Augustin Schooling, MD - Primary  PHYSICIAN ASSISTANT:   ASSISTANTS: RNFA   ANESTHESIA:   regional and general  EBL:  Total I/O In: 1100 [I.V.:1100] Out: 750 [Urine:550; Blood:200]  BLOOD ADMINISTERED:none  DRAINS: none   LOCAL MEDICATIONS USED:  MARCAINE     SPECIMEN:  No Specimen  DISPOSITION OF SPECIMEN:  N/A  COUNTS:  YES  TOURNIQUET:  * No tourniquets in log *  DICTATION: .Other Dictation: Dictation Number [id number]  PLAN OF CARE: Admit to inpatient   PATIENT DISPOSITION:  PACU - hemodynamically stable.   Delay start of Pharmacological VTE agent (>24hrs) due to surgical blood loss or risk of bleeding: no

## 2013-07-22 NOTE — Anesthesia Procedure Notes (Signed)
Anesthesia Regional Block:  Interscalene brachial plexus block  Pre-Anesthetic Checklist: ,, timeout performed, Correct Patient, Correct Site, Correct Laterality, Correct Procedure, Correct Position, site marked, Risks and benefits discussed,  Surgical consent,  Pre-op evaluation,  At surgeon's request and post-op pain management  Laterality: Right and Upper  Prep: chloraprep       Needles:  Injection technique: Single-shot  Needle Type: Echogenic Needle     Needle Length: 5cm 5 cm Needle Gauge: 21 and 21 G    Additional Needles:  Procedures: ultrasound guided (picture in chart) Interscalene brachial plexus block Narrative:  Start time: 07/22/2013 1:13 PM End time: 07/22/2013 1:21 PM Injection made incrementally with aspirations every 5 mL.  Performed by: Personally  Anesthesiologist: Lorrene Reid, MD

## 2013-07-22 NOTE — H&P (View-Only) (Signed)
Orthopedics Progress Note  Subjective: My right shoulder hurts  Objective:  Filed Vitals:   07/21/13 1210  BP: 150/57  Pulse: 77  Temp: 99.4 F (37.4 C)  Resp: 18    General: Awake and alert  Musculoskeletal: right shoulder immob in sling, wiggles fingers Neurovascularly intact  Lab Results  Component Value Date   WBC 8.5 07/21/2013   HGB 8.5* 07/21/2013   HCT 23.2* 07/21/2013   MCV 84.1 07/21/2013   PLT 95* 07/21/2013       Component Value Date/Time   NA 136* 07/21/2013 0615   K 3.8 07/21/2013 0615   CL 97 07/21/2013 0615   CO2 28 07/21/2013 0615   GLUCOSE 114* 07/21/2013 0615   BUN 8 07/21/2013 0615   CREATININE 0.51 07/21/2013 0615   CREATININE 0.71 04/13/2013 0957   CALCIUM 8.4 07/21/2013 0615   GFRNONAA >90 07/21/2013 0615   GFRAA >90 07/21/2013 0615    Lab Results  Component Value Date   INR 1.10 07/18/2013   INR 0.99 03/27/2010    Assessment/Plan: S/p fall with right tibia fracture s/p IM nailing by Dr Alusio.  Patient also with comminuted proximal humerus fracture with head split. Hemi-arthroplasty planned for tomorrow.  Npo after midnight. Acute blood loss anemia.  Transfusing in preparation for more blood loss and surgery tomorrow. Surgical plan explained to patient who agrees with proceeding.  Steven R. Jame Seelig, MD 07/21/2013 12:37 PM    

## 2013-07-22 NOTE — Progress Notes (Signed)
PT Cancellation Note  Patient Details Name: [name] MRN: [id number] DOB: 07-30-1950   Cancelled Treatment:    Reason Eval/Treat Not Completed: Patient at procedure or test/unavailable. Pt currently in OR for R shoulder surgery. Will continue to follow.    Jolyn Lent 07/22/2013, 2:18 PM  Jolyn Lent, [place], DPT [phone]

## 2013-07-22 NOTE — Preoperative (Signed)
Beta Blockers   Reason not to administer Beta Blockers:Not Applicable 

## 2013-07-22 NOTE — Interval H&P Note (Signed)
History and Physical Interval Note:  07/22/2013 12:59 PM  [name]  has presented today for surgery, with the diagnosis of RIGHT SHOULDER FRACTURE  The various methods of treatment have been discussed with the patient and family. After consideration of risks, benefits and other options for treatment, the patient has consented to  Procedure(s): RIGHT SHOULDER HEMI-ARTHROPLASTY (Right) as a surgical intervention .  The patient's history has been reviewed, patient examined, no change in status, stable for surgery.  I have reviewed the patient's chart and labs.  Questions were answered to the patient's satisfaction.     Jakyle Petrucelli,STEVEN R

## 2013-07-23 ENCOUNTER — Encounter (HOSPITAL_COMMUNITY): Payer: Self-pay | Admitting: Orthopedic Surgery

## 2013-07-23 LAB — BASIC METABOLIC PANEL
BUN: 12 mg/dL (ref 6–23)
CHLORIDE: 97 meq/L (ref 96–112)
CO2: 25 mEq/L (ref 19–32)
Calcium: 8.2 mg/dL — ABNORMAL LOW (ref 8.4–10.5)
Creatinine, Ser: 0.66 mg/dL (ref 0.50–1.10)
GFR calc non Af Amer: >90 mL/min (ref >90)
Glucose, Bld: 101 mg/dL — ABNORMAL HIGH (ref 70–99)
Potassium: 4.2 mEq/L (ref 3.7–5.3)
Sodium: 135 mEq/L — ABNORMAL LOW (ref 137–147)

## 2013-07-23 LAB — CBC
HEMATOCRIT: 25.9 % — AB (ref 36.0–46.0)
Hemoglobin: 9.3 g/dL — ABNORMAL LOW (ref 12.0–15.0)
MCH: 30.3 pg (ref 26.0–34.0)
MCHC: 35.9 g/dL (ref 30.0–36.0)
MCV: 84.4 fL (ref 78.0–100.0)
Platelets: 107 10*3/uL — ABNORMAL LOW (ref 150–400)
RBC: 3.07 MIL/uL — ABNORMAL LOW (ref 3.87–5.11)
RDW: 12.9 % (ref 11.5–15.5)
WBC: 8.3 10*3/uL (ref 4.0–10.5)

## 2013-07-23 MED ORDER — DOCUSATE SODIUM 100 MG PO CAPS
100.0000 mg | ORAL_CAPSULE | Freq: Two times a day (BID) | ORAL | Status: DC
  Administered 2013-07-23 – 2013-07-24 (×3): 100 mg via ORAL
  Filled 2013-07-23 (×3): qty 1

## 2013-07-23 MED ORDER — POLYETHYLENE GLYCOL 3350 17 G PO PACK: 17.0000 g | PACK | Freq: Every day | ORAL | Status: DC | PRN

## 2013-07-23 MED ORDER — HYDROMORPHONE HCL 2 MG PO TABS
2.0000 mg | ORAL_TABLET | ORAL | Status: DC | PRN
  Administered 2013-07-23 – 2013-07-24 (×2): 2 mg via ORAL
  Filled 2013-07-23 (×2): qty 1

## 2013-07-23 NOTE — Evaluation (Signed)
Occupational Therapy Evaluation Patient Details Name: [name] MRN: [id number] DOB: [birth year] Today's Date: 07/23/2013 Time: 3329-5188 OT Time Calculation (min): 23 min  OT Assessment / Plan / Recommendation History of present illness Pt is a 63 y/o female admitted s/p fall at home. She is now s/p Im nailing of R tibial/fibular fracture, and was also found to have a right shoulder fracture now s/p shoulder hemi-arthoplasty.    Clinical Impression   This 63 yo female admitted and underwent above presents to acute OT     OT Assessment  Patient needs continued OT Services    Follow Up Recommendations  SNF       Equipment Recommendations   (TBD next venue)       Frequency  Min 2X/week    Precautions / Restrictions Precautions Precautions: Fall Precaution Comments: Please limit resistance and weight bearing with the right shoulder, lap slides, AROM wrist and elbow, hand to face and gentle ADLs ok. ADL, sling, pendulums, AROM elbow-wrist-hand, AAROM shoulder within limits of pain Other Brace/Splint: Boot on RLE Restrictions Weight Bearing Restrictions: Yes RUE Weight Bearing: Non weight bearing RLE Weight Bearing: Non weight bearing       ADL  Eating/Feeding: Set up Where Assessed - Eating/Feeding: Chair Grooming: Minimal assistance Where Assessed - Grooming: Unsupported sitting Upper Body Bathing: Minimal assistance Where Assessed - Upper Body Bathing: Unsupported sitting Lower Body Bathing: Maximal assistance Where Assessed - Lower Body Bathing: Lean right and/or left;Supported sitting Upper Body Dressing: Set up Where Assessed - Upper Body Dressing: Unsupported sitting Lower Body Dressing: Maximal assistance Where Assessed - Lower Body Dressing: Lean right and/or left;Supported sitting Toilet Transfer: Moderate assistance Toilet Transfer Method: Stand pivot (to patient's left) Toilet Transfer Equipment: Bedside commode Toileting - Clothing Manipulation and  Hygiene:  (moderate A for clothing (sit<>stand), set up for front peri care (sit)) Equipment Used: Gait belt (sling, cam boot) Transfers/Ambulation Related to ADLs: Mod A for stand/squat pivot to pt's left    OT Diagnosis: Generalized weakness;Acute pain  OT Problem List: Decreased strength;Decreased range of motion;Impaired balance (sitting and/or standing);Pain;Impaired UE functional use;Obesity;Decreased knowledge of use of DME or AE OT Treatment Interventions: Self-care/ADL training;Therapeutic activities;Therapeutic exercise;Patient/family education;Balance training;DME and/or AE instruction   OT Goals(Current goals can be found in the care plan section) Acute Rehab OT Goals Patient Stated Goal: To get back to independent lifestyle as soon as possible OT Goal Formulation: With patient Time For Goal Achievement: 07/30/13 Potential to Achieve Goals: Good  Visit Information  Last OT Received On: 07/23/13 Assistance Needed: +1 PT/OT/SLP Co-Evaluation/Treatment: Yes Reason for Co-Treatment: For patient/therapist safety PT goals addressed during session: Mobility/safety with mobility;Balance;Proper use of DME;Strengthening/ROM OT goals addressed during session: ADL's and self-care;Strengthening/ROM History of Present Illness: Pt is a 63 y/o female admitted s/p fall at home. She is now s/p Im nailing of R tibial/fibular fracture, and was also found to have a right shoulder fracture which is being non-surgically treated.        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 Elbow distally WNL RUE Coordination: decreased gross motor     Mobility Bed Mobility Overal bed mobility: Needs Assistance Bed Mobility: Supine to Sit Supine to sit: Min assist;HOB elevated General bed mobility comments: VC's for sequencing and technique. Min assist for trunk support as pt transitioned to full sit on EOB.  Transfers Overall transfer level: Needs assistance Equipment used: 2 person hand held assist Transfers: Sit to/from [facility] Sit to Stand: Mod assist;+2 physical assistance Stand pivot transfers: Mod assist;+2 physical assistance General transfer comment: VC's for hand placement on seated surface prior to sit>stand. Cues for hand placement on [facility] for SPT. Assist to come to full stand and maintain NWB status on R upper and lower extremities.      Exercise Other Exercises Other Exercises: Had pt complete 10 repetitions of elbow AROM while supine in bed; Had her complete 10 AAROM of lap slides while seated in recliner. Encouraged her to continue to do both of these exercises througthout the day      End of Session OT - End of Session Equipment Utilized During Treatment: Gait belt Activity Tolerance: Patient tolerated treatment well Patient left: in chair;with call bell/phone within reach Nurse Communication: Mobility status    Almon Register [phone] 07/23/2013, 12:25 PM

## 2013-07-23 NOTE — Op Note (Signed)
NAME:  COUSINSCrislyn, Willbanks NO.:  [id number]  MEDICAL RECORD NO.:  [id number]  LOCATION:  [id number]                        FACILITY:  [facility]  PHYSICIAN:  Doran Heater. Veverly Fells, M.D. DATE OF BIRTH:  [birth year]  DATE OF PROCEDURE:  07/22/2013 DATE OF DISCHARGE:                              OPERATIVE REPORT   PREOPERATIVE DIAGNOSIS:  Displaced right proximal humerus fracture, 4 part head split.  POSTOPERATIVE DIAGNOSIS:  Displaced right proximal humerus fracture, 4 part head split.  PROCEDURE PERFORMED:  Right shoulder hemiarthroplasty using DePuy global unite system.  ATTENDING SURGEON:  Doran Heater. Veverly Fells, M.D.  ASSISTANT:  Additional scrub nurse.  General anesthesia plus interscalene block anesthesia was used.  ESTIMATED BLOOD LOSS:  200 mL.  FLUID REPLACEMENT:  1500 mL of crystalloid.  INSTRUMENT COUNTS:  Correct.  There were no complications.  Perioperative antibiotics were given.  INDICATIONS:  The patient is a 63 year old female who suffered a fall injuring her right leg and right shoulder.  The patient had displaced 4 part proximal humerus fracture with severe comminution and head splitting component.  Both x-rays and CT scan demonstrating this was not a salvageable shoulder that we could do an ORIF and then we would need to proceed with shoulder hemiarthroplasty.  The patient also had a broken tibia that required IM nailing by Dr. Gaynelle Arabian.  The patient's tibia surgery was done 2 days ago when she was recovering nicely from that.  She is now cleared for shoulder surgery today.  Risks and benefits of surgery discussed.  Informed consent was obtained.  DESCRIPTION OF PROCEDURE:  After adequate level of anesthesia achieved, the patient was positioned in the modified beach-chair position.  Right shoulder correctly identified and sterilely prepped in the usual manner. Time-out was called.  We then entered the shoulder.  Standard deltopectoral incision  started at the coracoid process extending down to the anterior humerus.  Dissection carried down through subcutaneous tissues using needle-tip Bovie, identified cephalic vein took laterally with the deltoid pectoralis was taken medially.  The upper centimeter of pectoralis was released.  The clavipectoral fascia was divided. Hematoma was evacuated.  We identified the biceps tendon.  I used a Bovie to divide the soft tissue overlying the biceps sheath.  Once we got before the biceps groove, we used a large 1-inch osteotome to osteotomize the lesser tuberosity free from the underlying bony tissue. Once we had that debulked, we placed #2 FiberWire in a modified W- stitch, medial to the lesser tuberosity and the subscap tendon.  We freed that up from underlying capsule.  The head was in about 4, 5 parts.  We removed those individually with a Cobb elevator.  The overall orientation of the head was pointed 180 degrees away from the glenoid. We moved ahead pieces.  We then tenotomized the biceps tendon off the upper labrum.  The glenoid was in good shape with excellent cartilage and glenoid labrum was not torn.  We then went ahead and found the greater tuberosity, debulked that removing remaining head attached to the greater tuberosity and placed #2 FiberWire sutures lateral to the greater tuberosity and the rotator cuff tendon in a modified W-stitch fashion with the suture limbs  coming out over the top of the tuberosity. We then removed excess bone from the posterior capsule.  We then reamed by hand up to a size 10 for the global unite system.  We then placed a 10 shaft with a -5 metaphyseal coracoid body with the anterior fin adjacent to biceps groove with a 44 x 18 head in place.  We were able to reduce the tuberosities in position.  We went ahead and marked the height of the stem.  We felt like we were at the appropriate depth, removed the trial component.  We irrigated thoroughly the canal,  placed a drying sponge in there.  We mixed DePuy high viscosity cement.  We placed drill holes in the shaft, placing sutures for repair of the tuberosity to shaft.  Through these drill holes, we placed a suture to the medial fin of the prosthesis.  We then mixed our cement, dried the canal, and then cemented the stem in place with the appropriate height, with the anterior fin adjacent to biceps groove.  Once the cement was allowed to harden, we placed our 44 x 18 head and impacted that, reduced the shoulder.  We were pleased with our retroversion and our height.  We then went ahead and placed around the world stitch lateral to the greater tuberosity and medial to the lesser tuberosity.  We also put some rotator interval sutures in, and then we went ahead extensively bone grafted the proximal humerus covering all exposed component.  We then brought the tuberosities back into position, tied those to each other.  We then went ahead and tied around the world stitch to compress the tuberosities back down onto the bone graft and the prosthesis.  We also took the sutures were in the drill holes through the humeral shaft and brought those out lateral to the greater tuberosity and medial to the lesser tuberosity to fix the lesser tuberosities to the shaft and then finally did our rotator interval sutures.  Everything moved together nicely as a unit.  Nice and stable conjoined tendon.  CA ligament was intact.  We then thoroughly irrigated the wound and then closed the deltopectoral interval with 0 Vicryl suture followed by 2-0 Vicryl subcutaneous closure and staples for the skin.  Sterile bandage was applied.  The patient was placed in a shoulder sling and taken to recovery room in stable condition.     Doran Heater. Veverly Fells, M.D.     SRN/MEDQ  D:  07/22/2013  T:  07/23/2013  Job:  [id number]

## 2013-07-23 NOTE — Progress Notes (Signed)
   Subjective: 1 Day Post-Op Procedure(s) (LRB): RIGHT SHOULDER HEMI-ARTHROPLASTY (Right) Patient reports pain as mild.   Patient is feeling better post shoulder reconstruction yesterday. No complaints with respect to her right leg We will start therapy today.  Plan is to go SNF vs CIR after hospital stay.  Objective: Vital signs in last 24 hours: Temp:  [97.5 F (36.4 C)-99.5 F (37.5 C)] 98.4 F (36.9 C) (01/29 0603) Pulse Rate:  [58-92] 61 (01/29 0603) Resp:  [10-24] 19 (01/29 0603) BP: (112-151)/(54-68) 126/56 mmHg (01/29 0603) SpO2:  [90 %-99 %] 94 % (01/29 0603)  Intake/Output from previous day:  Intake/Output Summary (Last 24 hours) at 07/23/13 0727 Last data filed at 07/23/13 0604  Gross per 24 hour  Intake   1830 ml  Output   4350 ml  Net  -2520 ml    Intake/Output this shift:    Labs:  Recent Labs  07/21/13 0615 07/21/13 2228 07/22/13 1952 07/23/13 0415  HGB 8.5* 10.6* 10.4* 9.3*    Recent Labs  07/22/13 1952 07/23/13 0415  WBC 10.9* 8.3  RBC 3.34* 3.07*  HCT 28.3* 25.9*  PLT 101* 107*    Recent Labs  07/21/13 0615 07/22/13 1952 07/23/13 0415  NA 136*  --  135*  K 3.8  --  4.2  CL 97  --  97  CO2 28  --  25  BUN 8  --  12  CREATININE 0.51 0.53 0.66  GLUCOSE 114*  --  101*  CALCIUM 8.4  --  8.2*   No results found for this basename: LABPT, INR,  in the last 72 hours  EXAM General - Patient is Alert, Appropriate and Oriented Extremity - Neurologically intact Neurovascular intact No cellulitis present Compartment soft Dressing - dressing C/D/I Motor Function - intact, moving foot and toes well on exam.    Past Medical History  Diagnosis Date  . Hypertension   . Thyroid disease     S/p Radiation  . ACROCHORDON 03/16/2010  . Hypothyroidism   . Liver cirrhosis     Assessment/Plan: 1 Day Post-Op Procedure(s) (LRB): RIGHT SHOULDER HEMI-ARTHROPLASTY (Right) Active Problems:   Tibia/fibula fracture   Fracture of proximal end  of right humerus   Up with therapy Plan for discharge tomorrow    Gearlean Alf 07/23/2013, 7:27 AM

## 2013-07-23 NOTE — Progress Notes (Signed)
Orthopedics Progress Note  Subjective: I feel better today.  Objective:  Filed Vitals:   07/23/13 0603  BP: 126/56  Pulse: 61  Temp: 98.4 F (36.9 C)  Resp: 19    General: Awake and alert  Musculoskeletal: right shoulder dressing CDI, minimal pain with passive IR and ER of the shoulder and AROM elbow and wrist. Neurovascularly intact  Lab Results  Component Value Date   WBC 8.3 07/23/2013   HGB 9.3* 07/23/2013   HCT 25.9* 07/23/2013   MCV 84.4 07/23/2013   PLT 107* 07/23/2013       Component Value Date/Time   NA 135* 07/23/2013 0415   K 4.2 07/23/2013 0415   CL 97 07/23/2013 0415   CO2 25 07/23/2013 0415   GLUCOSE 101* 07/23/2013 0415   BUN 12 07/23/2013 0415   CREATININE 0.66 07/23/2013 0415   CREATININE 0.71 04/13/2013 0957   CALCIUM 8.2* 07/23/2013 0415   GFRNONAA >90 07/23/2013 0415   GFRAA >90 07/23/2013 0415    Lab Results  Component Value Date   INR 1.10 07/18/2013   INR 0.99 03/27/2010    Assessment/Plan: POD #1 s/p Procedure(s): RIGHT SHOULDER HEMI-ARTHROPLASTY Stable this AM.  Patient will need SNF rehab for probably 10 plus weeks. The patient is moving at some point down to [place].  I would recommend that she stay locally in SNF rehab for at least 6 weeks until patient progressing well with the tibia and shoulder before transfer of care to another orthopedist. Need [facility] OT and PT orders in EPIC, thanks!  Doran Heater. Veverly Fells, MD 07/23/2013 7:59 AM

## 2013-07-23 NOTE — Discharge Summary (Signed)
Physician Discharge Summary   Patient ID: [name] MRN: [id number] DOB/AGE: July 10, 1950 63 y.o.  Admit date: 07/18/2013 Discharge date: 07-24-2013  Primary Diagnosis:   Right tib/fib fracture Right proximal humerus fracture  Admission Diagnoses:  Past Medical History  Diagnosis Date  . Hypertension   . Thyroid disease     S/p Radiation  . [place] 03/16/2010  . Hypothyroidism   . Liver cirrhosis    Discharge Diagnoses:   Active Problems:   Tibia/fibula fracture   Fracture of proximal end of right humerus  Estimated body mass index is 33.49 kg/(m^2) as calculated from the following:   Height as of this encounter: 5' 3"  (1.6 m).   Weight as of this encounter: 85.73 kg (189 lb).  Procedure(s) (LRB): 1) Intramedullary nail, right tibia - Dr. Pilar Plate Aluisio 2) RIGHT SHOULDER HEMI-ARTHROPLASTY - Dr. Netta Cedars  Consults: CIR Team  HPI: Ms. [name] is a 63 yo female who was walking down the steps at a friends house earlier this evening and missed the last step landing on her right side. She denies any head trauma, LOC, chest pain, palpitations, SOB or dizziness associated with the fall. She had immediate pain right shoulder and right lower leg. Denies any numbness, tingling in right arm or leg. Only complaints are right shoulder pain and right lower leg pain. Presented to [facility] ED with right tib/fib fracture and right proximal humerus fracture.  Laboratory Data: Admission on 07/18/2013  Component Date Value Range Status  . WBC 07/18/2013 9.7  4.0 - 10.5 K/uL Final  . RBC 07/18/2013 3.69* 3.87 - 5.11 MIL/uL Final  . Hemoglobin 07/18/2013 11.4* 12.0 - 15.0 g/dL Final  . HCT 07/18/2013 30.8* 36.0 - 46.0 % Final  . MCV 07/18/2013 83.5  78.0 - 100.0 fL Final  . MCH 07/18/2013 30.9  26.0 - 34.0 pg Final  . MCHC 07/18/2013 37.0* 30.0 - 36.0 g/dL Final   CORRECTED FOR COLD AGGLUTININS  . RDW 07/18/2013 12.6  11.5 - 15.5 % Final  . Platelets 07/18/2013 149* 150 - 400 K/uL  Final  . Neutrophils Relative % 07/18/2013 69  43 - 77 % Final  . Neutro Abs 07/18/2013 6.7  1.7 - 7.7 K/uL Final  . Lymphocytes Relative 07/18/2013 24  12 - 46 % Final  . Lymphs Abs 07/18/2013 2.3  0.7 - 4.0 K/uL Final  . Monocytes Relative 07/18/2013 5  3 - 12 % Final  . Monocytes Absolute 07/18/2013 0.4  0.1 - 1.0 K/uL Final  . Eosinophils Relative 07/18/2013 3  0 - 5 % Final  . Eosinophils Absolute 07/18/2013 0.3  0.0 - 0.7 K/uL Final  . Basophils Relative 07/18/2013 0  0 - 1 % Final  . Basophils Absolute 07/18/2013 0.0  0.0 - 0.1 K/uL Final  . Sodium 07/18/2013 135* 137 - 147 mEq/L Final  . Potassium 07/18/2013 4.0  3.7 - 5.3 mEq/L Final  . Chloride 07/18/2013 98  96 - 112 mEq/L Final  . CO2 07/18/2013 25  19 - 32 mEq/L Final  . Glucose, Bld 07/18/2013 104* 70 - 99 mg/dL Final  . BUN 07/18/2013 15  6 - 23 mg/dL Final  . Creatinine, Ser 07/18/2013 0.65  0.50 - 1.10 mg/dL Final  . Calcium 07/18/2013 9.3  8.4 - 10.5 mg/dL Final  . Total Protein 07/18/2013 6.9  6.0 - 8.3 g/dL Final  . Albumin 07/18/2013 3.9  3.5 - 5.2 g/dL Final  . AST 07/18/2013 23  0 - 37 U/L Final  HEMOLYSIS AT THIS LEVEL MAY AFFECT RESULT  . ALT 07/18/2013 23  0 - 35 U/L Final  . Alkaline Phosphatase 07/18/2013 83  39 - 117 U/L Final  . Total Bilirubin 07/18/2013 1.1  0.3 - 1.2 mg/dL Final  . GFR calc non Af Amer 07/18/2013 >90  >90 mL/min Final  . GFR calc Af Amer 07/18/2013 >90  >90 mL/min Final   Comment: (NOTE)                          The eGFR has been calculated using the CKD EPI equation.                          This calculation has not been validated in all clinical situations.                          eGFR's persistently <90 mL/min signify possible Chronic Kidney                          Disease.  Marland Kitchen Prothrombin Time 07/18/2013 14.0  11.6 - 15.2 seconds Final  . INR 07/18/2013 1.10  0.00 - 1.49 Final  . ABO/RH(D) 07/18/2013 A POS   Final  . Antibody Screen 07/18/2013 NEG   Final  . Sample  Expiration 07/18/2013 07/21/2013   Final  . Unit Number 07/18/2013 [id number]   Final  . Blood Component Type 07/18/2013 RED CELLS,LR   Final  . Unit division 07/18/2013 00   Final  . Status of Unit 07/18/2013 ISSUED,FINAL   Final  . Transfusion Status 07/18/2013 OK TO TRANSFUSE   Final  . Crossmatch Result 07/18/2013 Compatible   Final  . Unit Number 07/18/2013 [id number]   Final  . Blood Component Type 07/18/2013 RED CELLS,LR   Final  . Unit division 07/18/2013 00   Final  . Status of Unit 07/18/2013 ISSUED,FINAL   Final  . Transfusion Status 07/18/2013 OK TO TRANSFUSE   Final  . Crossmatch Result 07/18/2013 Compatible   Final  . MRSA, PCR 07/19/2013 NEGATIVE  NEGATIVE Final  . Staphylococcus aureus 07/19/2013 NEGATIVE  NEGATIVE Final   Comment:                                 The Xpert SA Assay (FDA                          approved for NASAL specimens                          in patients over 43 years of age),                          is one component of                          a comprehensive surveillance                          program.  Test performance has                          been  validated by [facility] for patients greater                          than or equal to 77 year old.                          It is not intended                          to diagnose infection nor to                          guide or monitor treatment.  . Sodium 07/20/2013 135* 137 - 147 mEq/L Final  . Potassium 07/20/2013 3.7  3.7 - 5.3 mEq/L Final  . Chloride 07/20/2013 97  96 - 112 mEq/L Final  . CO2 07/20/2013 25  19 - 32 mEq/L Final  . Glucose, Bld 07/20/2013 121* 70 - 99 mg/dL Final  . BUN 07/20/2013 9  6 - 23 mg/dL Final  . Creatinine, Ser 07/20/2013 0.58  0.50 - 1.10 mg/dL Final  . Calcium 07/20/2013 8.1* 8.4 - 10.5 mg/dL Final  . GFR calc non Af Amer 07/20/2013 >90  >90 mL/min Final  . GFR calc Af Amer 07/20/2013 >90  >90 mL/min Final   Comment:  (NOTE)                          The eGFR has been calculated using the CKD EPI equation.                          This calculation has not been validated in all clinical situations.                          eGFR's persistently <90 mL/min signify possible Chronic Kidney                          Disease.  . WBC 07/20/2013 8.9  4.0 - 10.5 K/uL Final  . RBC 07/20/2013 2.87* 3.87 - 5.11 MIL/uL Final  . Hemoglobin 07/20/2013 8.8* 12.0 - 15.0 g/dL Final   Comment: DELTA CHECK NOTED                          REPEATED TO VERIFY  . HCT 07/20/2013 23.9* 36.0 - 46.0 % Final  . MCV 07/20/2013 83.3  78.0 - 100.0 fL Final  . MCH 07/20/2013 30.7  26.0 - 34.0 pg Final  . MCHC 07/20/2013 36.8* 30.0 - 36.0 g/dL Final  . RDW 07/20/2013 12.5  11.5 - 15.5 % Final  . Platelets 07/20/2013 94* 150 - 400 K/uL Final   Comment: PLATELET COUNT CONFIRMED BY SMEAR                          DELTA CHECK NOTED  . Sodium 07/21/2013 136* 137 - 147 mEq/L Final  . Potassium 07/21/2013 3.8  3.7 - 5.3 mEq/L Final  . Chloride 07/21/2013 97  96 - 112 mEq/L Final  . CO2 07/21/2013 28  19 - 32 mEq/L Final  . Glucose, Bld 07/21/2013 114* 70 - 99 mg/dL Final  . BUN 07/21/2013 8  6 - 23 mg/dL Final  . Creatinine, Ser 07/21/2013 0.51  0.50 - 1.10 mg/dL Final  . Calcium 07/21/2013 8.4  8.4 - 10.5 mg/dL Final  . GFR calc non Af Amer 07/21/2013 >90  >90 mL/min Final  . GFR calc Af Amer 07/21/2013 >90  >90 mL/min Final   Comment: (NOTE)                          The eGFR has been calculated using the CKD EPI equation.                          This calculation has not been validated in all clinical situations.                          eGFR's persistently <90 mL/min signify possible Chronic Kidney                          Disease.  . WBC 07/21/2013 8.5  4.0 - 10.5 K/uL Final  . RBC 07/21/2013 2.76* 3.87 - 5.11 MIL/uL Final  . Hemoglobin 07/21/2013 8.5* 12.0 - 15.0 g/dL Final  . HCT 07/21/2013 23.2* 36.0 - 46.0 % Final  . MCV 07/21/2013  84.1  78.0 - 100.0 fL Final  . MCH 07/21/2013 30.8  26.0 - 34.0 pg Final  . MCHC 07/21/2013 36.6* 30.0 - 36.0 g/dL Final  . RDW 07/21/2013 12.3  11.5 - 15.5 % Final  . Platelets 07/21/2013 95* 150 - 400 K/uL Final   CONSISTENT WITH PREVIOUS RESULT  . Order Confirmation 07/21/2013 ORDER PROCESSED BY BLOOD BANK   Final  . Hemoglobin 07/21/2013 10.6* 12.0 - 15.0 g/dL Final   POST TRANSFUSION SPECIMEN  . HCT 07/21/2013 28.8* 36.0 - 46.0 % Final  . WBC 07/22/2013 10.9* 4.0 - 10.5 K/uL Final  . RBC 07/22/2013 3.34* 3.87 - 5.11 MIL/uL Final  . Hemoglobin 07/22/2013 10.4* 12.0 - 15.0 g/dL Final  . HCT 07/22/2013 28.3* 36.0 - 46.0 % Final  . MCV 07/22/2013 84.7  78.0 - 100.0 fL Final  . MCH 07/22/2013 31.1  26.0 - 34.0 pg Final  . MCHC 07/22/2013 36.7* 30.0 - 36.0 g/dL Final  . RDW 07/22/2013 13.0  11.5 - 15.5 % Final  . Platelets 07/22/2013 101* 150 - 400 K/uL Final   Comment: REPEATED TO VERIFY                          SPECIMEN CHECKED FOR CLOTS                          CONSISTENT WITH PREVIOUS RESULT  . Creatinine, Ser 07/22/2013 0.53  0.50 - 1.10 mg/dL Final  . GFR calc non Af Amer 07/22/2013 >90  >90 mL/min Final  . GFR calc Af Amer 07/22/2013 >90  >90 mL/min Final   Comment: (NOTE)                          The eGFR has been calculated using the CKD EPI equation.  This calculation has not been validated in all clinical situations.                          eGFR's persistently <90 mL/min signify possible Chronic Kidney                          Disease.  . Sodium 07/23/2013 135* 137 - 147 mEq/L Final  . Potassium 07/23/2013 4.2  3.7 - 5.3 mEq/L Final  . Chloride 07/23/2013 97  96 - 112 mEq/L Final  . CO2 07/23/2013 25  19 - 32 mEq/L Final  . Glucose, Bld 07/23/2013 101* 70 - 99 mg/dL Final  . BUN 07/23/2013 12  6 - 23 mg/dL Final  . Creatinine, Ser 07/23/2013 0.66  0.50 - 1.10 mg/dL Final  . Calcium 07/23/2013 8.2* 8.4 - 10.5 mg/dL Final  . GFR calc non Af Amer  07/23/2013 >90  >90 mL/min Final  . GFR calc Af Amer 07/23/2013 >90  >90 mL/min Final   Comment: (NOTE)                          The eGFR has been calculated using the CKD EPI equation.                          This calculation has not been validated in all clinical situations.                          eGFR's persistently <90 mL/min signify possible Chronic Kidney                          Disease.  . WBC 07/23/2013 8.3  4.0 - 10.5 K/uL Final  . RBC 07/23/2013 3.07* 3.87 - 5.11 MIL/uL Final  . Hemoglobin 07/23/2013 9.3* 12.0 - 15.0 g/dL Final  . HCT 07/23/2013 25.9* 36.0 - 46.0 % Final  . MCV 07/23/2013 84.4  78.0 - 100.0 fL Final  . MCH 07/23/2013 30.3  26.0 - 34.0 pg Final  . MCHC 07/23/2013 35.9  30.0 - 36.0 g/dL Final  . RDW 07/23/2013 12.9  11.5 - 15.5 % Final  . Platelets 07/23/2013 107* 150 - 400 K/uL Final   CONSISTENT WITH PREVIOUS RESULT     X-Rays:Dg Chest 1 View  07/18/2013   CLINICAL DATA:  Post fall with shoulder pain.  EXAM: CHEST - 1 VIEW  COMPARISON:  09/09/2011  FINDINGS: Lungs are clear. There is mild stable cardiomegaly. There is a displaced somewhat comminuted right humeral head/ neck fracture.  IMPRESSION: No acute cardiopulmonary disease. Right humeral head/ neck fracture.   Electronically Signed   By: Marin Olp M.D.   On: 07/18/2013 20:06   Dg Cervical Spine 2 Or 3 Views  07/18/2013   CLINICAL DATA:  Fall.  EXAM: CERVICAL SPINE - 2-3 VIEW  COMPARISON:  None.  FINDINGS: The cervical spine, particularly the lower cervical spine is poorly imaged due to the overlying shoulders. CT of the cervical spine is suggested for further evaluation.  IMPRESSION: Lower cervical spine inadequately imaged due to overlying shoulders. CT of the cervical spine is suggested.   Electronically Signed   By: Marcello Moores  Register   On: 07/18/2013 20:13   Dg Pelvis 1-2 Views  07/18/2013   CLINICAL DATA:  Post fall.  EXAM:  PELVIS - 1-2 VIEW  COMPARISON:  None.  FINDINGS: There is mild diffuse  osteopenia. There are symmetric mild degenerative changes of the hips. There is no definite acute fracture or dislocation. There are mild degenerative changes of the spine.  IMPRESSION: No acute fracture.   Electronically Signed   By: Marin Olp M.D.   On: 07/18/2013 20:10   Dg Shoulder Right  07/22/2013   CLINICAL DATA:  Right humeral head fracture  EXAM: RIGHT SHOULDER - 2+ VIEW  COMPARISON:  07/20/2013, 07/18/2013  FINDINGS: Right shoulder hemiarthroplasty has been performed. Grossly normal alignment. AC joint also aligned. Small residual fracture fragments noted about the proximal humerus hardware. No hardware abnormality.  IMPRESSION: Status post right shoulder arthroplasty.  No complicating feature   Electronically Signed   By: Daryll Brod M.D.   On: 07/22/2013 18:45   Dg Shoulder Right  07/18/2013   CLINICAL DATA:  Pain post fall.  EXAM: RIGHT SHOULDER - 2+ VIEW; RIGHT HUMERUS - 2+ VIEW  COMPARISON:  None.  FINDINGS: There is an acute displaced fracture of the right humeral head and neck with prominent displaced greater tuberosity fragment. No evidence of dislocation. Remainder the exam is unremarkable.  IMPRESSION: Displaced and comminuted fracture of the right humeral head/ neck with prominent displaced greater tuberosity fragment.   Electronically Signed   By: Marin Olp M.D.   On: 07/18/2013 20:08   Dg Tibia/fibula Right  07/19/2013   CLINICAL DATA:  Tibia fracture  EXAM: RIGHT TIBIA AND FIBULA - 2 VIEW  COMPARISON:  Yesterday  FINDINGS: An intra medullary rod transfixes a mid shaft tibia fracture. No breakage or loosening of the hardware. One proximal and 1 distal interlocking screw.  IMPRESSION: ORIF tibia fracture.   Electronically Signed   By: Maryclare Bean M.D.   On: 07/19/2013 14:25   Dg Tibia/fibula Right  07/18/2013   CLINICAL DATA:  Pain post fall.  EXAM: RIGHT TIBIA AND FIBULA - 2 VIEW  COMPARISON:  None.  FINDINGS: There is diffuse osteopenia. There are displaced transverse  fractures of the mid tibial and fibular diaphyseal regions. There is mild lateral in posterior angulation of the distal fragments. There is also a suggestion of a nondisplaced fracture the base of the fibular head. There is a well-defined ovoid lucency over the lateral cortex of the mid to distal fibular diaphysis.  IMPRESSION: Displaced transverse fractures of the mid to distal diaphysis of the tibia and fibula. Nondisplaced fracture of the base of the fibular head.  Ovoid lucency over the lateral cortex of the mid to distal fibular diaphysis. Cannot exclude metastatic disease or multiple myeloma. Recommend clinical correlation.   Electronically Signed   By: Marin Olp M.D.   On: 07/18/2013 20:15   Ct Shoulder Right Wo Contrast  07/20/2013   CLINICAL DATA:  Right shoulder pain status post fall. Evaluate proximal humeral fracture.  EXAM: CT OF THE RIGHT SHOULDER WITHOUT CONTRAST  TECHNIQUE: Multidetector CT imaging was performed according to the standard protocol. Multiplanar CT image reconstructions were also generated.  COMPARISON:  Right shoulder radiographs same date.  FINDINGS: There is an extensively comminuted and displaced fracture of the right humeral head and neck. The fracture involves the articular surface of the humeral head posteriorly. There is displacement of both tuberosities. There is no significant angulation of the main fracture fragments.  There is subluxation of the humeral head with respect to the glenoid, attributed to an associated hemarthrosis. There is soft tissue swelling throughout the axilla  and proximal upper arm. No glenoid or other scapular fracture is demonstrated. The acromioclavicular joint is intact.  IMPRESSION: Extensively comminuted and displaced intra-articular fracture of the right humeral head and neck as described. Inferior subluxation of the humeral head attributed to hemarthrosis. No evidence of glenoid fracture.   Electronically Signed   By: Camie Patience M.D.    On: 07/20/2013 16:31   Dg Humerus Right  07/18/2013   CLINICAL DATA:  Pain post fall.  EXAM: RIGHT SHOULDER - 2+ VIEW; RIGHT HUMERUS - 2+ VIEW  COMPARISON:  None.  FINDINGS: There is an acute displaced fracture of the right humeral head and neck with prominent displaced greater tuberosity fragment. No evidence of dislocation. Remainder the exam is unremarkable.  IMPRESSION: Displaced and comminuted fracture of the right humeral head/ neck with prominent displaced greater tuberosity fragment.   Electronically Signed   By: Marin Olp M.D.   On: 07/18/2013 20:08   Dg C-arm 1-60 Min  07/23/2013   CLINICAL DATA: Tibia fracture  EXAM: RIGHT TIBIA AND FIBULA - 2 VIEW  COMPARISON: Yesterday  FINDINGS: An intra medullary rod transfixes a mid shaft tibia fracture. No breakage or loosening of the hardware. One proximal and 1 distal interlocking screw.  IMPRESSION: ORIF tibia fracture.   Electronically Signed   By: Maryclare Bean M.D.   On: 07/23/2013 14:13    EKG: Orders placed during the hospital encounter of 07/18/13  . EKG 12-LEAD  . EKG 12-LEAD     Hospital Course: Patient was admitted to [facility] through the Emergency Department after sustaining a fall while walking down the steps at a friends house earlier that evening and missed the last step landing on her right side. Her only complaints at that time were right shoulder pain and right lower leg pain. The ortho tech placed her into a sling for the right shoulder and stirrup splint for her right leg initially.  She came in and was placed at bedrest and scheduled for surgery for the leg the following day.  The next morning, she was taken to the OR and underwent the above state procedure number (1) without complications.  Patient tolerated the procedure well and was later transferred to the recovery room and then to the orthopaedic floor for postoperative care.  They were given PO and IV analgesics for pain control following their surgery.  They  were given 24 hours of postoperative antibiotics of  Anti-infectives   Start     Dose/Rate Route Frequency Ordered Stop   07/22/13 2000  ceFAZolin (ANCEF) IVPB 2 g/50 mL premix     2 g 100 mL/hr over 30 Minutes Intravenous Every 6 hours 07/22/13 1741 07/23/13 0852   07/21/13 1015  ceFAZolin (ANCEF) IVPB 2 g/50 mL premix     2 g 100 mL/hr over 30 Minutes Intravenous On call to O.R. 07/21/13 1004 07/22/13 0559   07/19/13 1400  ceFAZolin (ANCEF) IVPB 2 g/50 mL premix     2 g 100 mL/hr over 30 Minutes Intravenous Every 6 hours 07/19/13 1009 07/20/13 0524     and started on DVT prophylaxis in the form of Lovenox.   PT and OT were ordered for mobility and gait training.  The patient was allowed to only be TDWB to the right leg with therapy. She was placed into a CAM boot for support for her leg postop.  Discharge planning was consulted to help with postop disposition and equipment needs. Social worker got involved to look for  a SNF.  Patient had a decent night on the evening of surgery.  They started to get up OOB with therapy on day one. Dr. Wynelle Link spoke with Dr. Netta Cedars about the patient's shoulder in regards to getting it operated on at a later time. Continued to work with therapy into day two.  Dressing was changed on day two and the incisions were healing well.  She was setup for surgery on the next day.  CIR was consulted to see if she would be a candidate for their unit.  On day three, the patient was taken to the operating room and underwent the above (2) procedure without difficulty by Dr. Veverly Fells.  She was tranasferred to recovery and then later to the orthopedic floor for continued care. Patient was seen in rounds on POD 1 from her shoulder and POD 4 from her leg and doing well.  She was feeling better following her shoulder reconstruction.  Arrangements were being made for placement. Dr. Veverly Fells felt that the atient would need SNF rehab for probably 10 plus weeks.  The patient was planning  on moving at some point down to [place]. It was recommend that she stay locally in SNF rehab for at least 6 weeks until patient was progressing well with the tibia and shoulder before transfer of care to another orthopedist. She was seen on Friday 1/30 and doing well.  It was felt that the patient was ready to go to the SNF at that time.   Discharge Medications: Prior to Admission medications   Medication Sig Start Date End Date Taking? Authorizing Provider  atorvastatin (LIPITOR) 20 MG tablet Take 20 mg by mouth at bedtime.   Yes Historical Provider, MD  B Complex-C (B-COMPLEX WITH VITAMIN C) tablet Take 1 tablet by mouth daily.   Yes Historical Provider, MD  calcium-vitamin D (OSCAL WITH D) 500-200 MG-UNIT per tablet Take 1 tablet by mouth.   Yes Historical Provider, MD  citalopram (CELEXA) 20 MG tablet Take 20 mg by mouth daily.   Yes Historical Provider, MD  Flaxseed, Linseed, (FLAX SEEDS PO) Take 1 tablet by mouth daily.   Yes Historical Provider, MD  levothyroxine (SYNTHROID, LEVOTHROID) 125 MCG tablet Take 125 mcg by mouth at bedtime.   Yes Historical Provider, MD  lisinopril-hydrochlorothiazide (PRINZIDE,ZESTORETIC) 20-25 MG per tablet Take 1 tablet by mouth daily.   Yes Historical Provider, MD  Melatonin 5 MG CAPS Take 1 capsule by mouth at bedtime as needed.   Yes Historical Provider, MD  Omega-3 Fatty Acids (FISH OIL) 300 MG CAPS Take 1 capsule by mouth daily.   Yes Historical Provider, MD    Diet: Cardiac diet Activity:  TDWB only to right leg and NWB to the right arm Follow-up:in 2 weeks with Dr. Wynelle Link and Dr. Veverly Fells at [facility] facility Discharged Condition: Improving       Discharge Orders   Future Orders Complete By Expires   Call MD / Call 911  As directed    Comments:     If you experience chest pain or shortness of breath, CALL 911 and be transported to the hospital emergency room.  If you develope a fever above 101 F,  pus (white drainage) or increased drainage or redness at the wound, or calf pain, call your surgeon's office.   Change dressing  As directed    Comments:     Change dressing daily with sterile 4 x 4 inch gauze dressing and apply TED hose. Do  not submerge the incision under water.   Constipation Prevention  As directed    Comments:     Drink plenty of fluids.  Prune juice may be helpful.  You may use a stool softener, such as Colace (over the counter) 100 mg twice a day.  Use MiraLax (over the counter) for constipation as needed.   Diet - low sodium heart healthy  As directed    Discharge instructions  As directed    Comments:     Pick up stool softner and laxative for home. Do not submerge incision under water. May shower. Continue to use ice for pain and swelling from surgery on the right leg and the right shoulder.  Take Lovenox Injections daily for seven more days and then switch over to a 325 mg Aspirin daily for four more weeks.   Do not sit on low chairs, stoools or toilet seats, as it may be difficult to get up from low surfaces  As directed    Driving restrictions  As directed    Comments:     No driving until released by the physician.   Increase activity slowly as tolerated  As directed    Lifting restrictions  As directed    Comments:     No lifting until released by the physician.   Patient may shower  As directed    Comments:     You may shower without a dressing once there is no drainage.  Do not wash over the wound.  If drainage remains, do not shower until drainage stops.   TED hose  As directed    Comments:     Use stockings (TED hose) for 3 weeks on both leg(s).  You may remove them at night for sleeping.   Weight bearing as tolerated  As directed    Questions:     Laterality:     Extremity:         Medication List    STOP taking these medications       B-complex with vitamin C tablet     Fish Oil 300 MG Caps     FLAX SEEDS PO      TAKE these  medications       acetaminophen 325 MG tablet  Commonly known as:  TYLENOL  Take 2 tablets (650 mg total) by mouth every 6 (six) hours as needed for mild pain (or Fever >/= 101).     atorvastatin 20 MG tablet  Commonly known as:  LIPITOR  Take 20 mg by mouth at bedtime.     bisacodyl 10 MG suppository  Commonly known as:  DULCOLAX  Place 1 suppository (10 mg total) rectally daily as needed for moderate constipation.     calcium-vitamin D 500-200 MG-UNIT per tablet  Commonly known as:  OSCAL WITH D  Take 1 tablet by mouth.     citalopram 20 MG tablet  Commonly known as:  CELEXA  Take 20 mg by mouth daily.     DSS 100 MG Caps  Take 100 mg by mouth 2 (two) times daily.     enoxaparin 40 MG/0.4ML injection  Commonly known as:  LOVENOX  Inject 0.4 mLs (40 mg total) into the skin daily. Take Lovenox injections for seven more days and then change to a full dose 325 mg Aspirin daily for four weeks.     HYDROmorphone 2 MG tablet  Commonly known as:  DILAUDID  Take 1 tablet (2 mg total) by mouth every  4 (four) hours as needed for moderate pain or severe pain.     levothyroxine 125 MCG tablet  Commonly known as:  SYNTHROID, LEVOTHROID  Take 125 mcg by mouth at bedtime.     lisinopril-hydrochlorothiazide 20-25 MG per tablet  Commonly known as:  PRINZIDE,ZESTORETIC  Take 1 tablet by mouth daily.     Melatonin 5 MG Caps  Take 1 capsule by mouth at bedtime as needed.     methocarbamol 500 MG tablet  Commonly known as:  ROBAXIN  Take 1 tablet (500 mg total) by mouth every 6 (six) hours.     metoCLOPramide 5 MG tablet  Commonly known as:  REGLAN  Take 1-2 tablets (5-10 mg total) by mouth every 8 (eight) hours as needed for nausea (if ondansetron (ZOFRAN) ineffective.).     ondansetron 4 MG tablet  Commonly known as:  ZOFRAN  Take 1 tablet (4 mg total) by mouth every 6 (six) hours as needed for nausea.     polyethylene glycol packet  Commonly known as:  MIRALAX / GLYCOLAX    Take 17 g by mouth daily as needed for mild constipation.       Follow-up Information   Follow up with NORRIS,STEVEN R, MD. Schedule an appointment as soon as possible for a visit in 2 weeks. [phone])    Specialty:  Orthopedic Surgery   Contact information:   [address] [place] [address] [place] [address] [phone]       Follow up with Gearlean Alf, MD. Schedule an appointment as soon as possible for a visit in 2 weeks.   Specialty:  Orthopedic Surgery   Contact information:   [address] [place] [address] [phone]       Signed: Mickel Crow 07/24/2013, 7:39 AM

## 2013-07-23 NOTE — Progress Notes (Signed)
Physical Therapy Treatment Patient Details Name: [name] MRN: [id number] DOB: [birth year] Today's Date: 07/23/2013 Time: 6712-4580 PT Time Calculation (min): 39 min  PT Assessment / Plan / Recommendation  History of Present Illness Pt is a 63 y/o female admitted s/p fall at home. She is now s/p Im nailing of R tibial/fibular fracture, and was also found to have a right shoulder fracture which is being non-surgically treated.    PT Comments   Pt progressing well towards physical therapy goals. She is able to tolerate LE therapeutic exercise well, even with increased weight of boot. Continues to progress with transfers. Per chart review, pt will require 10+ weeks of rehab, and discharge disposition changed to SNF.   Follow Up Recommendations  SNF     Does the patient have the potential to tolerate intense rehabilitation     Barriers to Discharge        Equipment Recommendations  Other (comment) (TBD by next venue of care)    Recommendations for Other Services    Frequency Min 3X/week   Progress towards PT Goals Progress towards PT goals: Progressing toward goals  Plan Discharge plan needs to be updated;Frequency needs to be updated    Precautions / Restrictions Precautions Precautions: Fall Precaution Comments: Please limit resistance and weight bearing with the right shoulder, lap slides, AROM wrist and elbow, hand to face and gentle ADLs ok. ADL, sling, pendulums, AROM elbow-wrist-hand, AAROM shoulder within limits of pain Other Brace/Splint: Boot on RLE Restrictions Weight Bearing Restrictions: Yes RUE Weight Bearing: Non weight bearing RLE Weight Bearing: Non weight bearing   Pertinent Vitals/Pain Pt reports minimal pain throughout session.     Mobility  Bed Mobility Overal bed mobility: Needs Assistance Bed Mobility: Supine to Sit Supine to sit: Min assist;HOB elevated General bed mobility comments: VC's for sequencing and technique. Min assist for trunk  support as pt transitioned to full sit on EOB.  Transfers Overall transfer level: Needs assistance Equipment used: 2 person hand held assist Transfers: Sit to/from [facility] Sit to Stand: Mod assist;+2 physical assistance Stand pivot transfers: Mod assist;+2 physical assistance General transfer comment: VC's for hand placement on seated surface prior to sit>stand. Cues for hand placement on [facility] for SPT. Assist to come to full stand and maintain NWB status on R upper and lower extremities.  Ambulation/Gait General Gait Details: NT at this time    Exercises General Exercises - Lower Extremity Ankle Circles/Pumps: 15 reps Quad Sets: 15 reps Short Arc Quad: 15 reps Heel Slides: 15 reps Hip ABduction/ADduction: 15 reps Straight Leg Raises: 15 reps;AAROM Other Exercises Other Exercises: Had pt complete 10 repetitions of elbow AROM while supine in bed; Had her complete 10 AAROM of lap slides while seated in recliner. Encouraged her to continue to do both of these exercises througthout the day   PT Diagnosis:    PT Problem List:   PT Treatment Interventions:     PT Goals (current goals can now be found in the care plan section) Acute Rehab PT Goals Patient Stated Goal: To get back to independent lifestyle as soon as possible PT Goal Formulation: With patient Time For Goal Achievement: 08/03/13 Potential to Achieve Goals: Good  Visit Information  Last PT Received On: 07/23/13 Assistance Needed: +1 PT/OT/SLP Co-Evaluation/Treatment: Yes Reason for Co-Treatment: For patient/therapist safety PT goals addressed during session: Mobility/safety with mobility;Proper use of DME;Balance;Strengthening/ROM OT goals addressed during session: ADL's and self-care;Strengthening/ROM History of Present Illness: Pt is a 63 y/o female  admitted s/p fall at home. She is now s/p Im nailing of R tibial/fibular fracture, and was also found to have a right shoulder fracture which is being  non-surgically treated.     Subjective Data  Subjective: "It feels good to move my legs around." Patient Stated Goal: To get back to independent lifestyle as soon as possible   Cognition  Cognition Arousal/Alertness: Awake/alert Behavior During Therapy: WFL for tasks assessed/performed Overall Cognitive Status: Within Functional Limits for tasks assessed    Balance  Balance Overall balance assessment: Needs assistance Sitting-balance support: Feet supported;Bilateral upper extremity supported Sitting balance-Leahy Scale: Fair Standing balance support: Single extremity supported Standing balance-Leahy Scale: Fair  End of Session PT - End of Session Equipment Utilized During Treatment: Gait belt;Other (comment) (Boot and sling) Activity Tolerance: Patient tolerated treatment well Patient left: in chair;with call bell/phone within reach Nurse Communication: Mobility status   GP     Jolyn Lent 07/23/2013, 3:19 PM  Jolyn Lent, [place], DPT [phone]

## 2013-07-23 NOTE — Progress Notes (Signed)
Rehab admissions - Evaluated for possible admission.  Received message that patient will be going SNF.  Noted Dr. Veverly Fells feels patient will need 6-10 weeks SNF.  I will sign off for acute inpatient rehab.  Call me for questions.  #[phone]

## 2013-07-23 NOTE — Care Management Note (Signed)
CARE MANAGEMENT NOTE 07/23/2013  Patient:  CARRIGAN, DELAFUENTE   Account Number:  [id number]  Date Initiated:  07/20/2013  Documentation initiated by:  Ricki Miller  Subjective/Objective Assessment:   63 yr old female s/p IM Nailing right tibia,  right shoulder fx.     Action/Plan:   PT/OT eval  Case manager will follow.Patient to have Hemiarthroplasty on 07/22/13. Patient may go to CIR vs SNF for Rehab.   Anticipated DC Date:  07/24/2013   Anticipated DC Plan:  SKILLED NURSING FACILITY  In-house referral  Clinical Social Worker      DC Planning Services  CM consult      Choice offered to / List presented to:             Status of service:  Completed, signed off Medicare Important Message given?   (If response is "NO", the following Medicare IM given date fields will be blank) Date Medicare IM given:   Date Additional Medicare IM given:    Discharge Disposition:  [place]

## 2013-07-23 NOTE — Anesthesia Postprocedure Evaluation (Signed)
  Anesthesia Post-op Note  Patient: [name]  Procedure(s) Performed: Procedure(s): RIGHT SHOULDER HEMI-ARTHROPLASTY (Right)  Patient Location: PACU  Anesthesia Type:GA combined with regional for post-op pain  Level of Consciousness: awake, alert  and oriented  Airway and Oxygen Therapy: Patient Spontanous Breathing and Patient connected to nasal cannula oxygen  Post-op Pain: mild  Post-op Assessment: Post-op Vital signs reviewed  Post-op Vital Signs: Reviewed  Complications: No apparent anesthesia complications

## 2013-07-24 MED ORDER — HYDROMORPHONE HCL 2 MG PO TABS: 2.0000 mg | ORAL_TABLET | ORAL | Status: DC | PRN

## 2013-07-24 MED ORDER — METOCLOPRAMIDE HCL 5 MG PO TABS: 5.0000 mg | ORAL_TABLET | Freq: Three times a day (TID) | ORAL | Status: DC | PRN

## 2013-07-24 MED ORDER — DSS 100 MG PO CAPS: 100.0000 mg | ORAL_CAPSULE | Freq: Two times a day (BID) | ORAL | Status: DC

## 2013-07-24 MED ORDER — POLYETHYLENE GLYCOL 3350 17 G PO PACK: 17.0000 g | PACK | Freq: Every day | ORAL | Status: DC | PRN

## 2013-07-24 MED ORDER — ENOXAPARIN SODIUM 40 MG/0.4ML ~~LOC~~ SOLN: 40.0000 mg | SUBCUTANEOUS | Status: DC

## 2013-07-24 MED ORDER — METHOCARBAMOL 500 MG PO TABS: 500.0000 mg | ORAL_TABLET | Freq: Four times a day (QID) | ORAL | Status: DC

## 2013-07-24 MED ORDER — ACETAMINOPHEN 325 MG PO TABS: 650.0000 mg | ORAL_TABLET | Freq: Four times a day (QID) | ORAL | Status: DC | PRN

## 2013-07-24 MED ORDER — ONDANSETRON HCL 4 MG PO TABS: 4.0000 mg | ORAL_TABLET | Freq: Four times a day (QID) | ORAL | Status: DC | PRN

## 2013-07-24 MED ORDER — BISACODYL 10 MG RE SUPP: 10.0000 mg | Freq: Every day | RECTAL | Status: DC | PRN

## 2013-07-24 NOTE — Progress Notes (Signed)
   Subjective: 2 Days Post-Op Procedure(s) (LRB): RIGHT SHOULDER HEMI-ARTHROPLASTY (Right)  Pt c/o mild to moderate soreness today mainly in the shoulder Leg doing well s/p surgery Plan for CIR hopefully later today Patient reports pain as moderate.  Objective:   VITALS:   Filed Vitals:   07/24/13 0540  BP: 176/59  Pulse: 70  Temp: 98.7 F (37.1 C)  Resp: 18    Right shoulder incision healing well nv intact distally No rashes  Minimal edema extending into right hand  LABS  Recent Labs  07/21/13 2228 07/22/13 1952 07/23/13 0415  HGB 10.6* 10.4* 9.3*  HCT 28.8* 28.3* 25.9*  WBC  --  10.9* 8.3  PLT  --  101* 107*     Recent Labs  07/22/13 1952 07/23/13 0415  NA  --  135*  K  --  4.2  BUN  --  12  CREATININE 0.53 0.66  GLUCOSE  --  101*     Assessment/Plan: 2 Days Post-Op Procedure(s) (LRB): RIGHT SHOULDER HEMI-ARTHROPLASTY (Right)  Pt doing well considering her injuries Plan for d/c to CIR hopefully today F/u in the office in 2-3 weeks Pain control as needed    Merla Riches, MPAS, PA-C  07/24/2013, 8:07 AM

## 2013-07-24 NOTE — Discharge Instructions (Signed)
NWB right shoulder. Ok for gentle ADLs as tolerated, AROM wrist and elbow, AAROM right shoulder Sling wear for comfort.  Keep pillow propped behind the right elbow  Pick up stool softner and laxative for home. Do not submerge incision under water. May shower. Continue to use ice for pain and swelling from surgery for right leg and right shoulder  TOUCH DOWN WEIGHT BEARING ONLY to right leg. CAM Boot at all times except for showering.  Lovenox injections for seven more days and then switch patient over to full dose Aspirin 325 mg daily for four more weeks.

## 2013-07-24 NOTE — Progress Notes (Signed)
Report called to [place] at [facility]

## 2013-07-24 NOTE — Progress Notes (Signed)
   Subjective: 2 Days Post-Op Procedure(s) (LRB): RIGHT SHOULDER HEMI-ARTHROPLASTY (Right) Patient reports pain as mild.   Patient seen in rounds for Dr. Wynelle Link. Patient is well, and has had no acute complaints or problems Patient is ready to go to the SNF of choice.  Objective: Vital signs in last 24 hours: Temp:  [98.6 F (37 C)-99 F (37.2 C)] 98.7 F (37.1 C) (01/30 0540) Pulse Rate:  [62-70] 70 (01/30 0540) Resp:  [18] 18 (01/30 0540) BP: (146-177)/(59-77) 176/59 mmHg (01/30 0540) SpO2:  [91 %-94 %] 93 % (01/30 0540)  Intake/Output from previous day:  Intake/Output Summary (Last 24 hours) at 07/24/13 0729 Last data filed at 07/24/13 0600  Gross per 24 hour  Intake    720 ml  Output      0 ml  Net    720 ml    Intake/Output this shift:    Labs:  Recent Labs  07/21/13 2228 07/22/13 1952 07/23/13 0415  HGB 10.6* 10.4* 9.3*    Recent Labs  07/22/13 1952 07/23/13 0415  WBC 10.9* 8.3  RBC 3.34* 3.07*  HCT 28.3* 25.9*  PLT 101* 107*    Recent Labs  07/22/13 1952 07/23/13 0415  NA  --  135*  K  --  4.2  CL  --  97  CO2  --  25  BUN  --  12  CREATININE 0.53 0.66  GLUCOSE  --  101*  CALCIUM  --  8.2*   No results found for this basename: LABPT, INR,  in the last 72 hours  EXAM: General - Patient is Alert, Appropriate and Oriented Extremity - Neurovascular intact Sensation intact distally Dorsiflexion/Plantar flexion intact to right foot  Incision - clean, dry, no drainage, healing to right leg incisions and right shoulder incision Motor Function - intact, moving foot and toes well on exam, moving hand and fingers on exam.  Assessment/Plan: 2 Days Post-Op Procedure(s) (LRB): RIGHT SHOULDER HEMI-ARTHROPLASTY (Right) Procedure(s) (LRB): RIGHT SHOULDER HEMI-ARTHROPLASTY (Right) Past Medical History  Diagnosis Date  . Hypertension   . Thyroid disease     S/p Radiation  . A[place]ORDON 03/16/2010  . Hypothyroidism   . Liver cirrhosis     Active Problems:   Tibia/fibula fracture   Fracture of proximal end of right humerus  Estimated body mass index is 33.49 kg/(m^2) as calculated from the following:   Height as of this encounter: 5\' 3"  (1.6 m).   Weight as of this encounter: 85.73 kg (189 lb). Up with therapy Discharge to SNF Diet - Cardiac diet Follow up - in 2 weeks  with Dr. Wynelle Link and Dr. Veverly Fells at [facility] [place] - T[facility]B only to right leg Disposition - Skilled nursing facility Condition Upon Discharge - Stable D/C Meds - See DC Summary  PERKINS, ALEXZANDREW 07/24/2013, 7:29 AM

## 2013-07-27 ENCOUNTER — Encounter (INDEPENDENT_AMBULATORY_CARE_PROVIDER_SITE_OTHER): Payer: Medicare HMO | Admitting: Ophthalmology

## 2013-09-05 ENCOUNTER — Other Ambulatory Visit: Payer: Self-pay | Admitting: Sports Medicine

## 2014-03-15 ENCOUNTER — Other Ambulatory Visit: Payer: Self-pay | Admitting: Sports Medicine

## 2014-12-20 ENCOUNTER — Other Ambulatory Visit: Payer: Self-pay

## 2015-06-14 ENCOUNTER — Ambulatory Visit (HOSPITAL_COMMUNITY): Payer: Medicare Other | Attending: Internal Medicine

## 2015-06-14 ENCOUNTER — Other Ambulatory Visit: Payer: Self-pay

## 2015-06-14 ENCOUNTER — Other Ambulatory Visit (HOSPITAL_COMMUNITY): Payer: Self-pay | Admitting: Internal Medicine

## 2015-06-14 DIAGNOSIS — F17200 Nicotine dependence, unspecified, uncomplicated: Secondary | ICD-10-CM | POA: Insufficient documentation

## 2015-06-14 DIAGNOSIS — R011 Cardiac murmur, unspecified: Secondary | ICD-10-CM

## 2015-06-14 DIAGNOSIS — I517 Cardiomegaly: Secondary | ICD-10-CM | POA: Diagnosis not present

## 2015-06-14 DIAGNOSIS — I5189 Other ill-defined heart diseases: Secondary | ICD-10-CM | POA: Insufficient documentation

## 2015-06-14 DIAGNOSIS — I059 Rheumatic mitral valve disease, unspecified: Secondary | ICD-10-CM | POA: Insufficient documentation

## 2015-06-14 DIAGNOSIS — I10 Essential (primary) hypertension: Secondary | ICD-10-CM | POA: Diagnosis not present

## 2015-06-14 DIAGNOSIS — I350 Nonrheumatic aortic (valve) stenosis: Secondary | ICD-10-CM | POA: Insufficient documentation

## 2015-06-14 DIAGNOSIS — I071 Rheumatic tricuspid insufficiency: Secondary | ICD-10-CM | POA: Diagnosis not present

## 2015-06-14 DIAGNOSIS — I340 Nonrheumatic mitral (valve) insufficiency: Secondary | ICD-10-CM | POA: Insufficient documentation

## 2015-07-12 ENCOUNTER — Other Ambulatory Visit: Payer: Self-pay

## 2015-07-12 DIAGNOSIS — Z1231 Encounter for screening mammogram for malignant neoplasm of breast: Secondary | ICD-10-CM

## 2015-07-21 ENCOUNTER — Ambulatory Visit
Admission: RE | Admit: 2015-07-21 | Discharge: 2015-07-21 | Disposition: A | Discharge status: Home / Self Care | Payer: Medicare Other | Source: Ambulatory Visit

## 2015-07-21 DIAGNOSIS — Z1231 Encounter for screening mammogram for malignant neoplasm of breast: Secondary | ICD-10-CM

## 2015-12-02 ENCOUNTER — Emergency Department (HOSPITAL_COMMUNITY)
Admission: EM | Admit: 2015-12-02 | Discharge: 2015-12-03 | Disposition: A | Discharge status: Home / Self Care | Payer: Medicare Other | Source: Home / Self Care | Attending: Emergency Medicine | Admitting: Emergency Medicine

## 2015-12-02 ENCOUNTER — Encounter (HOSPITAL_COMMUNITY): Payer: Self-pay | Admitting: Emergency Medicine

## 2015-12-02 DIAGNOSIS — Z79899 Other long term (current) drug therapy: Secondary | ICD-10-CM | POA: Insufficient documentation

## 2015-12-02 DIAGNOSIS — R112 Nausea with vomiting, unspecified: Secondary | ICD-10-CM | POA: Diagnosis not present

## 2015-12-02 DIAGNOSIS — F17210 Nicotine dependence, cigarettes, uncomplicated: Secondary | ICD-10-CM | POA: Insufficient documentation

## 2015-12-02 DIAGNOSIS — I10 Essential (primary) hypertension: Secondary | ICD-10-CM | POA: Insufficient documentation

## 2015-12-02 DIAGNOSIS — K922 Gastrointestinal hemorrhage, unspecified: Secondary | ICD-10-CM

## 2015-12-02 DIAGNOSIS — Z96611 Presence of right artificial shoulder joint: Secondary | ICD-10-CM

## 2015-12-02 DIAGNOSIS — K859 Acute pancreatitis without necrosis or infection, unspecified: Secondary | ICD-10-CM

## 2015-12-02 DIAGNOSIS — K8046 Calculus of bile duct with acute and chronic cholecystitis without obstruction: Secondary | ICD-10-CM | POA: Diagnosis not present

## 2015-12-02 LAB — CBC
HEMATOCRIT: 37 % (ref 36.0–46.0)
Hemoglobin: 13.7 g/dL (ref 12.0–15.0)
MCH: 31.4 pg (ref 26.0–34.0)
MCHC: 37 g/dL — ABNORMAL HIGH (ref 30.0–36.0)
MCV: 84.9 fL (ref 78.0–100.0)
Platelets: 175 10*3/uL (ref 150–400)
RBC: 4.36 MIL/uL (ref 3.87–5.11)
RDW: 13 % (ref 11.5–15.5)
WBC: 13 10*3/uL — AB (ref 4.0–10.5)

## 2015-12-02 LAB — COMPREHENSIVE METABOLIC PANEL
ALT: 415 U/L — ABNORMAL HIGH (ref 14–54)
ANION GAP: 9 (ref 5–15)
AST: 266 U/L — AB (ref 15–41)
Albumin: 4.2 g/dL (ref 3.5–5.0)
Alkaline Phosphatase: 139 U/L — ABNORMAL HIGH (ref 38–126)
BILIRUBIN TOTAL: 9.1 mg/dL — AB (ref 0.3–1.2)
BUN: 18 mg/dL (ref 6–20)
CHLORIDE: 99 mmol/L — AB (ref 101–111)
CO2: 26 mmol/L (ref 22–32)
Calcium: 9.2 mg/dL (ref 8.9–10.3)
Creatinine, Ser: 0.75 mg/dL (ref 0.44–1.00)
Glucose, Bld: 180 mg/dL — ABNORMAL HIGH (ref 65–99)
POTASSIUM: 3.2 mmol/L — AB (ref 3.5–5.1)
Sodium: 134 mmol/L — ABNORMAL LOW (ref 135–145)
TOTAL PROTEIN: 7.3 g/dL (ref 6.5–8.1)

## 2015-12-02 LAB — LIPASE, BLOOD: LIPASE: 3265 U/L — AB (ref 11–51)

## 2015-12-02 LAB — POC OCCULT BLOOD, ED: Fecal Occult Bld: POSITIVE — AB

## 2015-12-02 MED ORDER — SODIUM CHLORIDE 0.9 % IV BOLUS (SEPSIS)
1000.0000 mL | Freq: Once | INTRAVENOUS | Status: AC
Start: 2015-12-02 — End: 2015-12-03
  Administered 2015-12-02: 1000 mL via INTRAVENOUS

## 2015-12-02 MED ORDER — FAMOTIDINE IN NACL 20-0.9 MG/50ML-% IV SOLN
20.0000 mg | Freq: Once | INTRAVENOUS | Status: AC
  Administered 2015-12-02: 20 mg via INTRAVENOUS
  Filled 2015-12-02: qty 50

## 2015-12-02 NOTE — ED Notes (Signed)
Pt sts she is unable to give a urine sample at this time 

## 2015-12-02 NOTE — ED Provider Notes (Signed)
CSN: [id number]     Arrival date & time 12/02/15  1914 History   First MD Initiated Contact with Patient 12/02/15 2202     Chief Complaint  Patient presents with  . Abdominal Pain  . Melena   PT SAID THAT SHE THINKS THAT HER ULCERS ARE BLEEDING.  THE PT SAID THAT SHE DEVELOPED BLACK STOOLS TODAY.  SHE HAS HAD BLEEDING ULCERS IN THE PAST AND KNOWS THE SIGNS.  PT ALSO HAS SOME EPIGASTRIC TENDERNESS.  PT HAS A HX OF ETOH ABUSE, BUT NO LONGER DRINKS.  (Consider location/radiation/quality/duration/timing/severity/associated sxs/prior Treatment) Patient is a 65 y.o. female presenting with abdominal pain. The history is provided by the patient.  Abdominal Pain Pain location:  Epigastric Pain radiates to:  Does not radiate Pain severity:  Mild Onset quality:  Gradual Timing:  Constant Progression:  Unchanged   Past Medical History  Diagnosis Date  . Hypertension   . Thyroid disease     S/p Radiation  . ACRO[place]ON 03/16/2010  . Hypothyroidism   . Liver cirrhosis [facility])    Past Surgical History  Procedure Laterality Date  . Im nailing tibia Right 07/19/2012    DR ALUSIO  . Tubal ligation    . Tibia im nail insertion Right 07/19/2013    Procedure: INTRAMEDULLARY (IM) NAIL TIBIAL;  Surgeon: Gearlean Alf, MD;  Location: [facility];  Service: Orthopedics;  Laterality: Right;  . Shoulder hemi-arthroplasty Right 07/22/2013    Procedure: RIGHT SHOULDER HEMI-ARTHROPLASTY;  Surgeon: Augustin Schooling, MD;  Location: [facility];  Service: Orthopedics;  Laterality: Right;   No family history on file. Social History  Substance Use Topics  . Smoking status: Current Every Day Smoker -- 1.00 packs/day for 40 years    Types: Cigarettes  . Smokeless tobacco: Never Used  . Alcohol Use: No     Comment: QUIT DRINKING IN 2002   OB History    No data available     Review of Systems  Gastrointestinal: Positive for abdominal pain and blood in stool.  All other systems reviewed and are  negative.     Allergies  Oxycodone hcl  Home Medications   Prior to Admission medications   Medication Sig Start Date End Date Taking? Authorizing Provider  calcium-vitamin D (OSCAL WITH D) 500-200 MG-UNIT per tablet Take 1 tablet by mouth.   Yes Historical Provider, MD  citalopram (CELEXA) 20 MG tablet TAKE 1 TABLET BY MOUTH EVERY DAY. 03/17/14  Yes Patrecia Pour, MD  Cyanocobalamin (VITAMIN B12 PO) Take 1 tablet by mouth daily.   Yes Historical Provider, MD  levothyroxine (SYNTHROID, LEVOTHROID) 125 MCG tablet Take 125 mcg by mouth daily before breakfast.   Yes Historical Provider, MD  lisinopril-hydrochlorothiazide (PRINZIDE,ZESTORETIC) 20-12.5 MG tablet Take 1 tablet by mouth daily.   Yes Historical Provider, MD  Multiple Vitamins-Minerals (MULTIVITAMIN & MINERAL PO) Take 1 tablet by mouth daily.   Yes Historical Provider, MD  Omega-3 Fatty Acids (FISH OIL) 1000 MG CAPS Take 1 capsule by mouth daily.   Yes Historical Provider, MD  acetaminophen (TYLENOL) 325 MG tablet Take 2 tablets (650 mg total) by mouth every 6 (six) hours as needed for mild pain (or Fever >/= 101). Patient not taking: Reported on 12/02/2015 07/24/13   Arlee Muslim, PA-C  bisacodyl (DULCOLAX) 10 MG suppository Place 1 suppository (10 mg total) rectally daily as needed for moderate constipation. Patient not taking: Reported on 12/02/2015 07/24/13   Arlee Muslim, PA-C  docusate sodium 100 MG CAPS Take 100  mg by mouth 2 (two) times daily. Patient not taking: Reported on 12/02/2015 07/24/13   Arlee Muslim, PA-C  enoxaparin (LOVENOX) 40 MG/0.4ML injection Inject 0.4 mLs (40 mg total) into the skin daily. Take Lovenox injections for seven more days and then change to a full dose 325 mg Aspirin daily for four weeks. Patient not taking: Reported on 12/02/2015 07/24/13   Arlee Muslim, PA-C  HYDROcodone-acetaminophen (NORCO/VICODIN) 5-325 MG tablet Take 1 tablet by mouth every 4 (four) hours as needed. 12/02/15   Isla Pence, MD   HYDROmorphone (DILAUDID) 2 MG tablet Take 1 tablet (2 mg total) by mouth every 4 (four) hours as needed for moderate pain or severe pain. Patient not taking: Reported on 12/02/2015 07/24/13   Arlee Muslim, PA-C  lisinopril-hydrochlorothiazide (PRINZIDE,ZESTORETIC) 20-25 MG per tablet TAKE 1 TABLET BY MOUTH DAILY. Patient not taking: Reported on 12/02/2015    Gerda Diss, DO  Melatonin 5 MG CAPS Take 1 capsule by mouth at bedtime as needed.    Historical Provider, MD  methocarbamol (ROBAXIN) 500 MG tablet Take 1 tablet (500 mg total) by mouth every 6 (six) hours. Patient not taking: Reported on 12/02/2015 07/24/13   Arlee Muslim, PA-C  metoCLOPramide (REGLAN) 5 MG tablet Take 1-2 tablets (5-10 mg total) by mouth every 8 (eight) hours as needed for nausea (if ondansetron (ZOFRAN) ineffective.). Patient not taking: Reported on 12/02/2015 07/24/13   Arlee Muslim, PA-C  ondansetron (ZOFRAN) 4 MG tablet Take 1 tablet (4 mg total) by mouth every 6 (six) hours. 12/03/15   Isla Pence, MD  pantoprazole (PROTONIX) 20 MG tablet Take 1 tablet (20 mg total) by mouth daily. 12/02/15   Isla Pence, MD  polyethylene glycol [facility] Floria Raveling) packet Take 17 g by mouth daily as needed for mild constipation. 07/24/13   Arlee Muslim, PA-C   BP 151/87 mmHg  Pulse 67  Temp(Src) 98.7 F (37.1 C) (Oral)  Resp 25  SpO2 96% Physical Exam  Constitutional: She is oriented to person, place, and time. She appears well-developed and well-nourished.  HENT:  Head: Normocephalic and atraumatic.  Right Ear: External ear normal.  Left Ear: External ear normal.  Nose: Nose normal.  Mouth/Throat: Oropharynx is clear and moist.  Eyes: Conjunctivae and EOM are normal. Pupils are equal, round, and reactive to light.  Neck: Normal range of motion. Neck supple.  Cardiovascular: Normal rate, regular rhythm, normal heart sounds and intact distal pulses.   Pulmonary/Chest: Effort normal and breath sounds normal.  Abdominal: Soft.  Bowel sounds are normal. There is tenderness in the epigastric area.  Genitourinary: Guaiac positive stool.  Musculoskeletal: Normal range of motion.  Neurological: She is alert and oriented to person, place, and time.  Skin: Skin is warm and dry.  Psychiatric: She has a normal mood and affect. Her behavior is normal. Judgment and thought content normal.  Nursing note and vitals reviewed.   ED Course  Procedures (including critical care time) Labs Review Labs Reviewed  LIPASE, BLOOD - Abnormal; Notable for the following:    Lipase 3265 (*)    All other components within normal limits  COMPREHENSIVE METABOLIC PANEL - Abnormal; Notable for the following:    Sodium 134 (*)    Potassium 3.2 (*)    Chloride 99 (*)    Glucose, Bld 180 (*)    AST 266 (*)    ALT 415 (*)    Alkaline Phosphatase 139 (*)    Total Bilirubin 9.1 (*)    All other components  within normal limits  CBC - Abnormal; Notable for the following:    WBC 13.0 (*)    MCHC 37.0 (*)    All other components within normal limits  POC OCCULT BLOOD, ED - Abnormal; Notable for the following:    Fecal Occult Bld POSITIVE (*)    All other components within normal limits  URINALYSIS, ROUTINE W REFLEX MICROSCOPIC (NOT AT [facility])    Imaging Review No results found. I have personally reviewed and evaluated these images and lab results as part of my medical decision-making.   EKG Interpretation None      MDM  I ASKED PT ABOUT THE BILIRUBIN AND SHE SAID THAT IT HAS BEEN HIGH.  I WANTED PT TO STAY, BUT SHE WANTS TO GO HOME.  THE PT KNOWS THAT SHE CAN ALWAYS RETURN.  SHE SAID THAT SHE HAS A LOT OF PEOPLE AROUND HER WATCHING HER.  PT GIVEN THE NUMBER FOR [facility] TO F/U WITH. Final diagnoses:  Gastrointestinal hemorrhage, unspecified gastritis, unspecified gastrointestinal hemorrhage type  Acute pancreatitis, unspecified complication status, unspecified pancreatitis type      Isla Pence, MD 12/03/15 [phone]

## 2015-12-02 NOTE — ED Notes (Signed)
Pt c/o abdominal pain and black stools. States "I think my ulcers are bleeding."

## 2015-12-03 LAB — URINE MICROSCOPIC-ADD ON
BACTERIA UA: NONE SEEN
RBC / HPF: NONE SEEN RBC/hpf (ref 0–5)

## 2015-12-03 LAB — URINALYSIS, ROUTINE W REFLEX MICROSCOPIC
GLUCOSE, UA: NEGATIVE mg/dL
Ketones, ur: NEGATIVE mg/dL
Nitrite: NEGATIVE
PH: 6 (ref 5.0–8.0)
Protein, ur: NEGATIVE mg/dL
SPECIFIC GRAVITY, URINE: 1.009 (ref 1.005–1.030)

## 2015-12-03 MED ORDER — PANTOPRAZOLE SODIUM 20 MG PO TBEC: 20.0000 mg | DELAYED_RELEASE_TABLET | Freq: Every day | ORAL | Status: DC

## 2015-12-03 MED ORDER — HYDROCODONE-ACETAMINOPHEN 5-325 MG PO TABS: 1.0000 | ORAL_TABLET | ORAL | Status: DC | PRN

## 2015-12-03 MED ORDER — ONDANSETRON HCL 4 MG PO TABS: 4.0000 mg | ORAL_TABLET | Freq: Four times a day (QID) | ORAL | Status: DC

## 2015-12-03 NOTE — Discharge Instructions (Signed)
Gastrointestinal Bleeding °Gastrointestinal bleeding is bleeding somewhere along the path that food travels through the body (digestive tract). This path is anywhere between the mouth and the opening of the butt (anus). You may have blood in your throw up (vomit) or in your poop (stools). If there is a lot of bleeding, you may need to stay in the hospital. °HOME CARE °· Only take medicine as told by your doctor. °· Eat foods with fiber such as whole grains, fruits, and vegetables. You can also try eating 1 to 3 prunes a day. °· Drink enough fluids to keep your pee (urine) clear or pale yellow. °GET HELP RIGHT AWAY IF:  °· Your bleeding gets worse. °· You feel dizzy, weak, or you pass out (faint). °· You have bad cramps in your back or belly (abdomen). °· You have large blood clumps (clots) in your poop. °· Your problems are getting worse. °MAKE SURE YOU:  °· Understand these instructions. °· Will watch your condition. °· Will get help right away if you are not doing well or get worse. °  °This information is not intended to replace advice given to you by your health care provider. Make sure you discuss any questions you have with your health care provider. °  °Document Released: 03/20/2008 Document Revised: 05/28/2012 Document Reviewed: 11/29/2014 °Elsevier Interactive Patient Education ©2016 [employer] ° °

## 2015-12-05 ENCOUNTER — Encounter (HOSPITAL_COMMUNITY): Payer: Self-pay | Admitting: *Deleted

## 2015-12-05 ENCOUNTER — Telehealth: Payer: Self-pay | Admitting: Gastroenterology

## 2015-12-05 ENCOUNTER — Emergency Department (HOSPITAL_COMMUNITY): Payer: Medicare Other

## 2015-12-05 ENCOUNTER — Inpatient Hospital Stay (HOSPITAL_COMMUNITY)
Admission: EM | Admit: 2015-12-05 | Discharge: 2015-12-09 | DRG: 417 | Disposition: A | Discharge status: Home / Self Care | Payer: Medicare Other | Attending: Internal Medicine | Admitting: Internal Medicine

## 2015-12-05 DIAGNOSIS — Z79899 Other long term (current) drug therapy: Secondary | ICD-10-CM | POA: Diagnosis not present

## 2015-12-05 DIAGNOSIS — F17210 Nicotine dependence, cigarettes, uncomplicated: Secondary | ICD-10-CM | POA: Diagnosis present

## 2015-12-05 DIAGNOSIS — E039 Hypothyroidism, unspecified: Secondary | ICD-10-CM | POA: Diagnosis present

## 2015-12-05 DIAGNOSIS — K8050 Calculus of bile duct without cholangitis or cholecystitis without obstruction: Secondary | ICD-10-CM | POA: Diagnosis not present

## 2015-12-05 DIAGNOSIS — K831 Obstruction of bile duct: Secondary | ICD-10-CM

## 2015-12-05 DIAGNOSIS — K851 Biliary acute pancreatitis without necrosis or infection: Secondary | ICD-10-CM | POA: Diagnosis present

## 2015-12-05 DIAGNOSIS — K8046 Calculus of bile duct with acute and chronic cholecystitis without obstruction: Principal | ICD-10-CM | POA: Diagnosis present

## 2015-12-05 DIAGNOSIS — R112 Nausea with vomiting, unspecified: Secondary | ICD-10-CM | POA: Diagnosis present

## 2015-12-05 DIAGNOSIS — K7030 Alcoholic cirrhosis of liver without ascites: Secondary | ICD-10-CM | POA: Diagnosis not present

## 2015-12-05 DIAGNOSIS — K859 Acute pancreatitis without necrosis or infection, unspecified: Secondary | ICD-10-CM | POA: Diagnosis present

## 2015-12-05 DIAGNOSIS — I10 Essential (primary) hypertension: Secondary | ICD-10-CM | POA: Diagnosis not present

## 2015-12-05 DIAGNOSIS — R109 Unspecified abdominal pain: Secondary | ICD-10-CM

## 2015-12-05 DIAGNOSIS — E876 Hypokalemia: Secondary | ICD-10-CM | POA: Diagnosis not present

## 2015-12-05 DIAGNOSIS — Z419 Encounter for procedure for purposes other than remedying health state, unspecified: Secondary | ICD-10-CM

## 2015-12-05 LAB — CBC
HEMATOCRIT: 32.9 % — AB (ref 36.0–46.0)
Hemoglobin: 12.1 g/dL (ref 12.0–15.0)
MCH: 30.9 pg (ref 26.0–34.0)
MCHC: 36.8 g/dL — ABNORMAL HIGH (ref 30.0–36.0)
MCV: 84.1 fL (ref 78.0–100.0)
Platelets: 168 10*3/uL (ref 150–400)
RBC: 3.91 MIL/uL (ref 3.87–5.11)
RDW: 13.1 % (ref 11.5–15.5)
WBC: 8.4 10*3/uL (ref 4.0–10.5)

## 2015-12-05 LAB — URINALYSIS, ROUTINE W REFLEX MICROSCOPIC
Glucose, UA: NEGATIVE mg/dL
Hgb urine dipstick: NEGATIVE
Ketones, ur: NEGATIVE mg/dL
NITRITE: NEGATIVE
PROTEIN: NEGATIVE mg/dL
SPECIFIC GRAVITY, URINE: 1.014 (ref 1.005–1.030)
pH: 6.5 (ref 5.0–8.0)

## 2015-12-05 LAB — URINE MICROSCOPIC-ADD ON

## 2015-12-05 LAB — COMPREHENSIVE METABOLIC PANEL
ALT: 178 U/L — ABNORMAL HIGH (ref 14–54)
ANION GAP: 8 (ref 5–15)
AST: 65 U/L — AB (ref 15–41)
Albumin: 3.9 g/dL (ref 3.5–5.0)
Alkaline Phosphatase: 178 U/L — ABNORMAL HIGH (ref 38–126)
BUN: 10 mg/dL (ref 6–20)
CHLORIDE: 98 mmol/L — AB (ref 101–111)
CO2: 28 mmol/L (ref 22–32)
Calcium: 9.1 mg/dL (ref 8.9–10.3)
Creatinine, Ser: 0.47 mg/dL (ref 0.44–1.00)
GFR calc Af Amer: >60 mL/min (ref >60)
Glucose, Bld: 103 mg/dL — ABNORMAL HIGH (ref 65–99)
POTASSIUM: 3.1 mmol/L — AB (ref 3.5–5.1)
Sodium: 134 mmol/L — ABNORMAL LOW (ref 135–145)
Total Bilirubin: 9.5 mg/dL — ABNORMAL HIGH (ref 0.3–1.2)
Total Protein: 7.1 g/dL (ref 6.5–8.1)

## 2015-12-05 LAB — TSH: TSH: 2.795 u[IU]/mL (ref 0.350–4.500)

## 2015-12-05 LAB — LIPASE, BLOOD: Lipase: 1498 U/L — ABNORMAL HIGH (ref 11–51)

## 2015-12-05 LAB — PROTIME-INR
INR: 1 (ref 0.00–1.49)
Prothrombin Time: 13.4 seconds (ref 11.6–15.2)

## 2015-12-05 LAB — PHOSPHORUS: Phosphorus: 3.7 mg/dL (ref 2.5–4.6)

## 2015-12-05 LAB — MAGNESIUM: Magnesium: 1.5 mg/dL — ABNORMAL LOW (ref 1.7–2.4)

## 2015-12-05 IMAGING — CT CT ABD-PELV W/ CM
2 of 5 series · 15 of 46 positions shown, 17 images · IV contrast (ISOVUE)
Comparison: None.

CLINICAL DATA: Upper abdominal pain

EXAM:
CT ABDOMEN AND PELVIS WITH CONTRAST
TECHNIQUE: Multidetector CT imaging of the abdomen and pelvis was performed
using the standard protocol following bolus administration of
intravenous contrast.
CONTRAST:  100mL [28] IOPAMIDOL ([28]) INJECTION 61%

[Series 2: abd/pel with · axial · 0.74mm/px · z∈[-455,-65]mm · 12 of 91 slices shown, 14 images]
[im 7/91  soft-tissue]
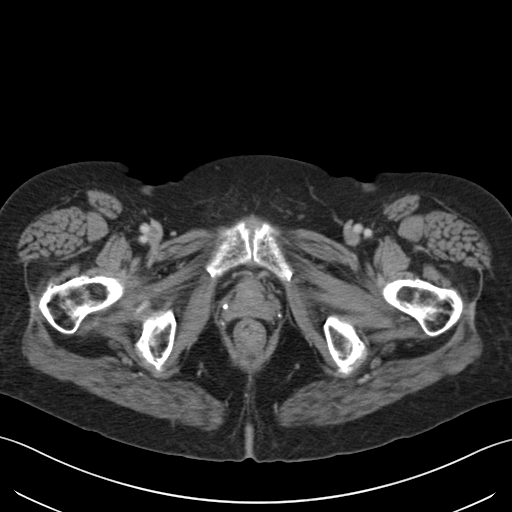
[im 7/91  bone]
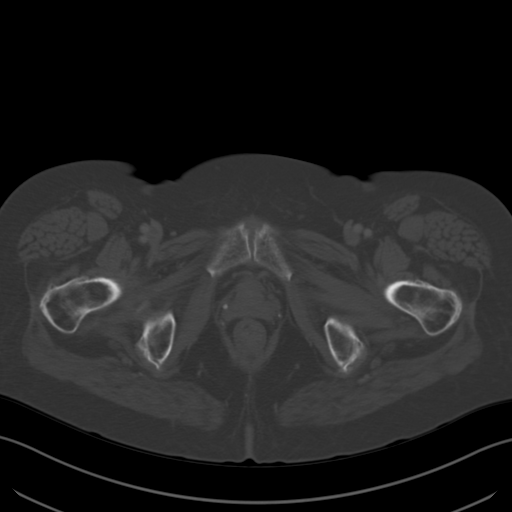
[im 13/91  soft-tissue]
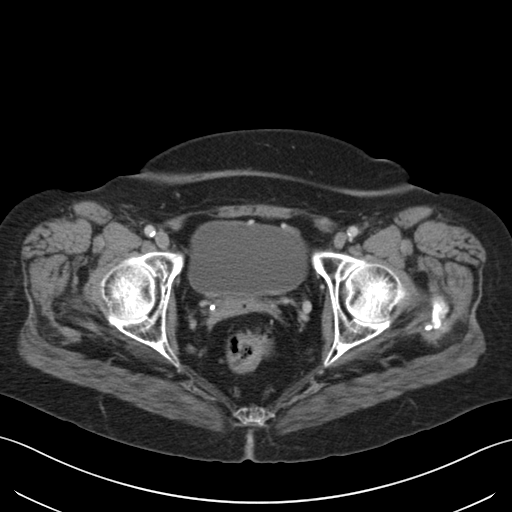
[im 19/91  soft-tissue]
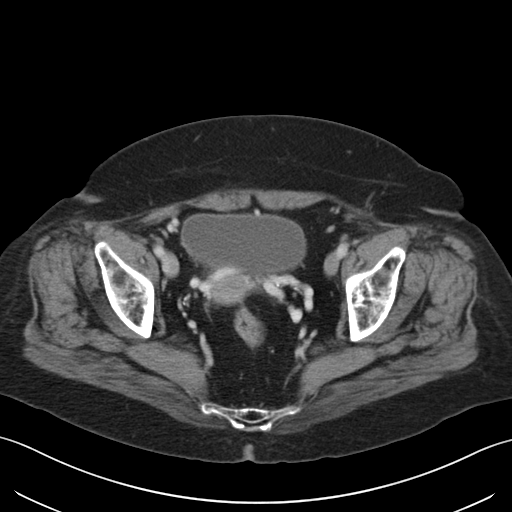
[im 31/91  soft-tissue]
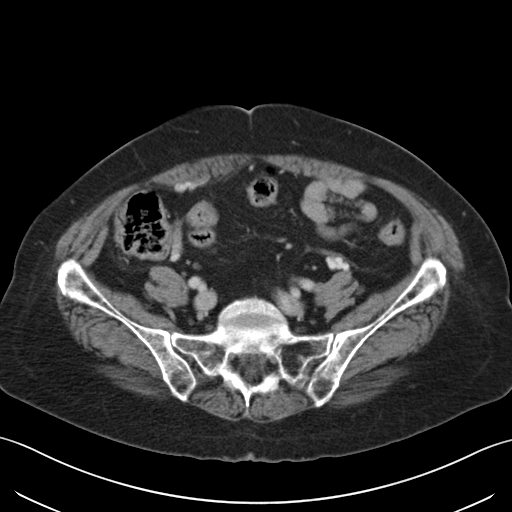
[im 37/91  soft-tissue]
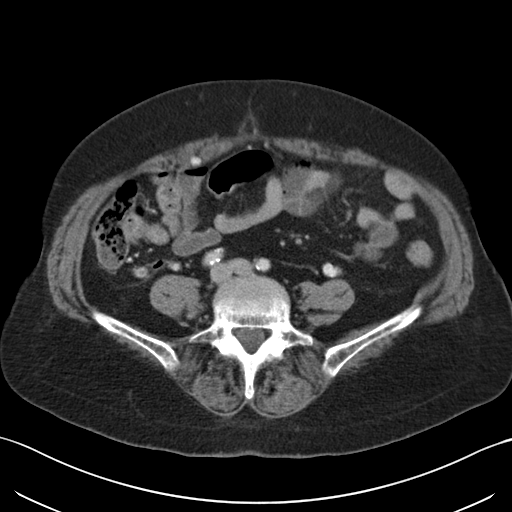
[im 43/91  soft-tissue]
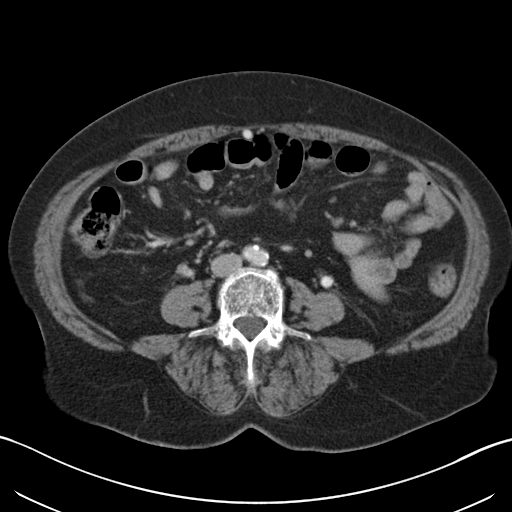
[im 49/91  soft-tissue]
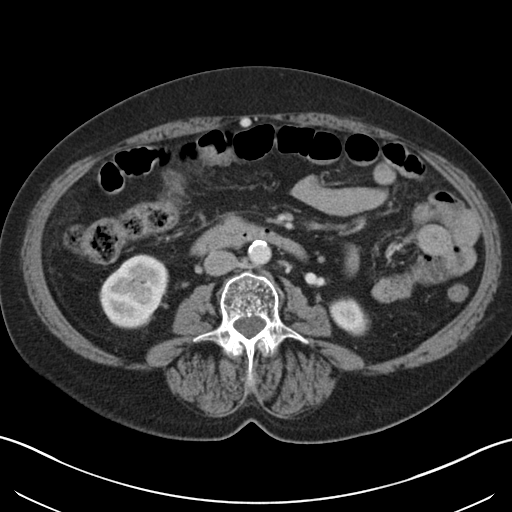
[im 55/91  soft-tissue]
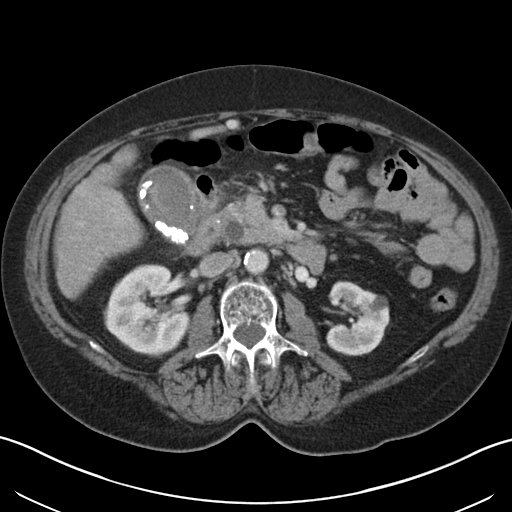
[im 61/91  soft-tissue]
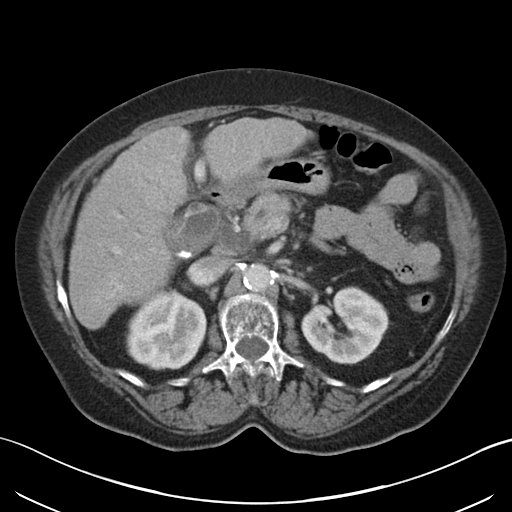
[im 61/91  bone]
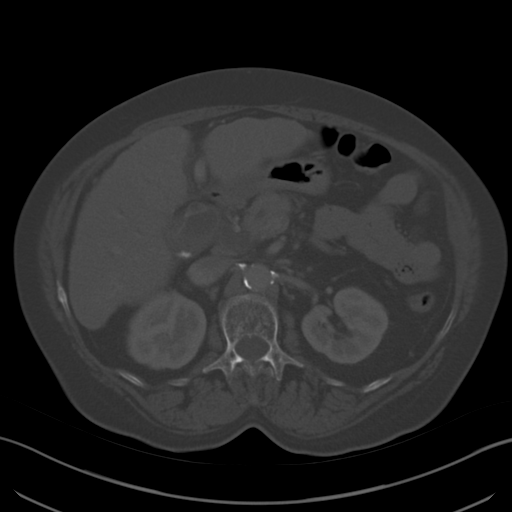
[im 73/91  soft-tissue]
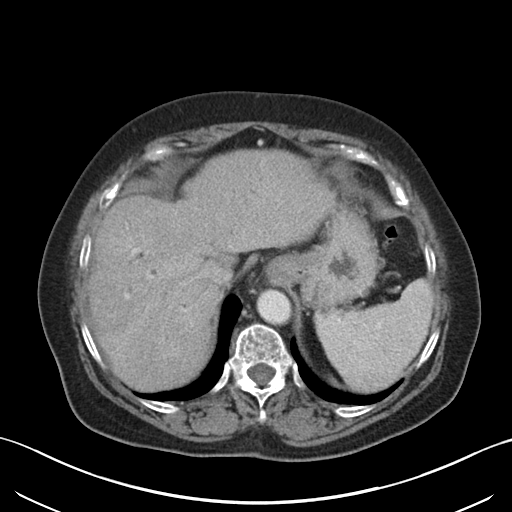
[im 79/91  soft-tissue]
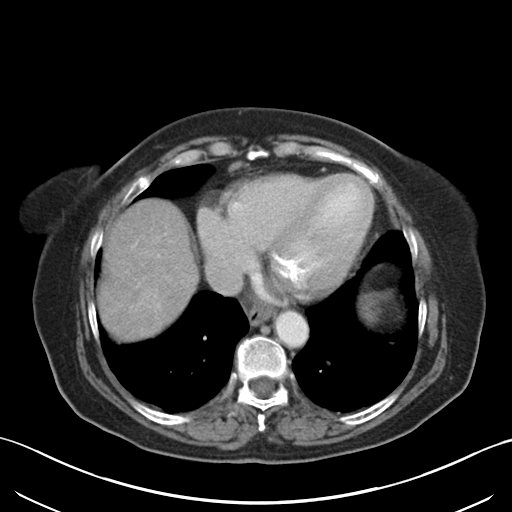
[im 85/91  soft-tissue]
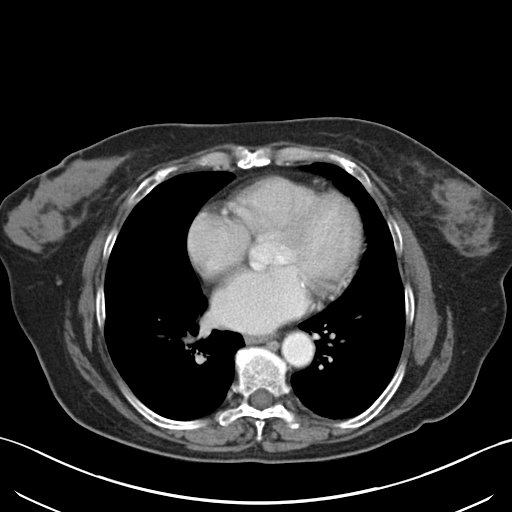

[Series 5: coronal a/|p · coronal · 0.75mm/px · 3 of 147 slices shown]
[im 49/147  soft-tissue]
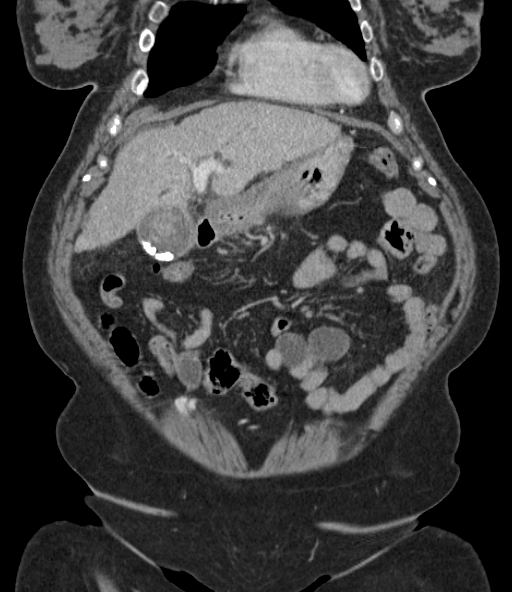
[im 65/147  soft-tissue]
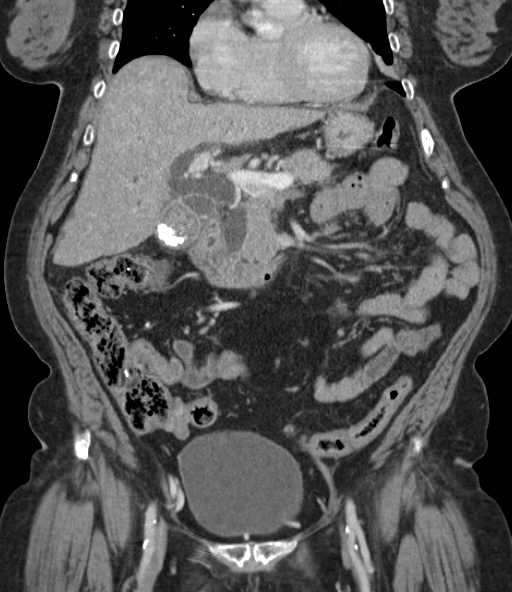
[im 82/147  soft-tissue]
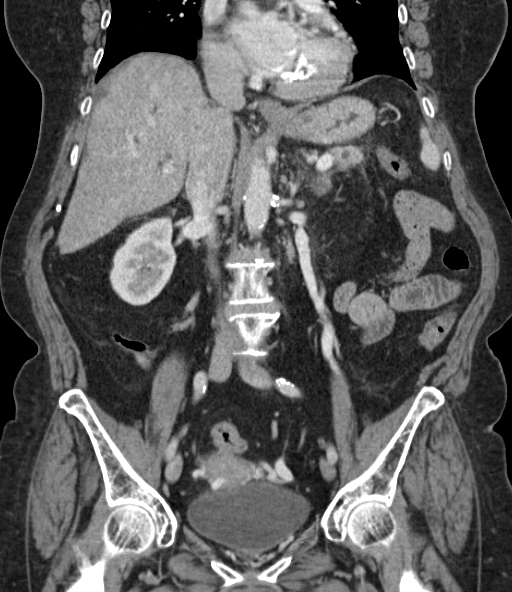

[15 of 46 positions shown; findings below may reference images not displayed]

FINDINGS: Lower chest: Calcified granuloma identified in the anterior right
lung base. Subsegmental atelectasis noted dependently in the lower
lobes bilaterally.

Hepatobiliary: Heterogeneous perfusion of the liver parenchyma is
evident and liver contour is nodular, specially along the inferior
right hepatic lobe. There is marked intra and extrahepatic biliary
duct dilatation. The common bile duct measures up to 18 mm
pancreatic head. Numerous calcified gallstones measure in the 4-7 mm
size range. Gallbladder lumen is filled fluid and central material
which may be tumefactive sludge. Gallbladder neoplasm considered
less likely.

5 mm stone is identified in the region of the common bile duct/ main
pancreatic duct confluence at the ampulla (best seen on coronal
image 62 of series 5).

Pancreas: Diffuse distention of the main pancreatic duct is noted.
No discrete pancreatic head mass is evident.

Spleen: No splenomegaly. No focal mass lesion.

Adrenals/Urinary Tract: No adrenal nodule or mass. 10 mm exophytic
lesion in the upper pole left kidney (see image 108 series 5) has
attenuation higher than be expected for a simple cyst. Enhancement
in this lesion cannot be excluded. Small low-density lesions in the
lower pole of the left kidney are probably cysts. No hydronephrosis.
No evidence for hydroureter. No ureteral or bladder stones.

Stomach/Bowel: Stomach is nondistended. No gastric wall thickening.
No evidence of outlet obstruction. Duodenum is normally positioned
as is the ligament of Treitz. No small bowel wall thickening. No
small bowel dilatation. The terminal ileum is normal.

Vascular/Lymphatic: There is abdominal aortic atherosclerosis
without aneurysm. There is no gastrohepatic or hepatoduodenal
ligament lymphadenopathy. No intraperitoneal or retroperitoneal
lymphadenopathy. No pelvic sidewall lymphadenopathy.

Reproductive: The uterus has normal CT imaging appearance. There is
no adnexal mass.

Other: No intraperitoneal free fluid.

Musculoskeletal: Loss vertebral body height at T[DATE] be related to
inferior endplate compression.
IMPRESSION: 1. Marked intra and extrahepatic biliary duct dilatation is
associated with diffuse dilatation of the main pancreatic duct.
These changes appear to be secondary to a 5 mm stone at the ampulla.
No discrete pancreatic head mass is evident on today's study. ERCP
may prove helpful to further evaluate.
2. Distended gallbladder with numerous calcified stones in the
lumen. Noncalcified debris in the lumen of the gallbladder is
probably tumefactive sludge. Gallbladder carcinoma is considered
less likely. Follow-up recommended.
3. 10 mm high density lesion in the upper pole the left kidney.
Small renal cell carcinoma not excluded. After resolution of acute
symptoms, consider MRI without with contrast to further evaluate.
4. Abdominal aortic atherosclerosis.
5. Question mild inferior endplate compression fracture at T12.

## 2015-12-05 MED ORDER — FENTANYL CITRATE (PF) 100 MCG/2ML IJ SOLN
25.0000 ug | INTRAMUSCULAR | Status: DC | PRN
  Administered 2015-12-05: 25 ug via INTRAVENOUS
  Filled 2015-12-05: qty 2

## 2015-12-05 MED ORDER — ENOXAPARIN SODIUM 40 MG/0.4ML ~~LOC~~ SOLN
40.0000 mg | SUBCUTANEOUS | Status: DC
  Administered 2015-12-05: 40 mg via SUBCUTANEOUS
  Filled 2015-12-05: qty 0.4

## 2015-12-05 MED ORDER — METRONIDAZOLE IN NACL 5-0.79 MG/ML-% IV SOLN
500.0000 mg | Freq: Three times a day (TID) | INTRAVENOUS | Status: DC
  Administered 2015-12-05 – 2015-12-09 (×11): 500 mg via INTRAVENOUS
  Filled 2015-12-05 (×11): qty 100

## 2015-12-05 MED ORDER — PANTOPRAZOLE SODIUM 20 MG PO TBEC
20.0000 mg | DELAYED_RELEASE_TABLET | Freq: Every day | ORAL | Status: DC
  Administered 2015-12-07 – 2015-12-09 (×3): 20 mg via ORAL
  Filled 2015-12-05 (×4): qty 1

## 2015-12-05 MED ORDER — LEVOTHYROXINE SODIUM 25 MCG PO TABS
125.0000 ug | ORAL_TABLET | Freq: Every day | ORAL | Status: DC
  Administered 2015-12-07 – 2015-12-09 (×3): 125 ug via ORAL
  Filled 2015-12-05 (×5): qty 1

## 2015-12-05 MED ORDER — LACTATED RINGERS IV SOLN
INTRAVENOUS | Status: DC
  Administered 2015-12-05 – 2015-12-08 (×7): via INTRAVENOUS

## 2015-12-05 MED ORDER — POTASSIUM CHLORIDE 20 MEQ/15ML (10%) PO SOLN
40.0000 meq | Freq: Once | ORAL | Status: DC
  Filled 2015-12-05: qty 30

## 2015-12-05 MED ORDER — POTASSIUM CHLORIDE 20 MEQ PO PACK
40.0000 meq | PACK | Freq: Once | ORAL | Status: DC
  Filled 2015-12-05: qty 2

## 2015-12-05 MED ORDER — POTASSIUM CHLORIDE 10 MEQ/100ML IV SOLN
10.0000 meq | INTRAVENOUS | Status: AC
  Administered 2015-12-05 (×3): 10 meq via INTRAVENOUS
  Filled 2015-12-05 (×3): qty 100

## 2015-12-05 MED ORDER — IOPAMIDOL (ISOVUE-300) INJECTION 61%
100.0000 mL | Freq: Once | INTRAVENOUS | Status: AC | PRN
  Administered 2015-12-05: 100 mL via INTRAVENOUS

## 2015-12-05 MED ORDER — CITALOPRAM HYDROBROMIDE 20 MG PO TABS
20.0000 mg | ORAL_TABLET | Freq: Every day | ORAL | Status: DC
  Administered 2015-12-07 – 2015-12-09 (×3): 20 mg via ORAL
  Filled 2015-12-05 (×3): qty 1
  Filled 2015-12-05: qty 2

## 2015-12-05 MED ORDER — CIPROFLOXACIN IN D5W 400 MG/200ML IV SOLN
400.0000 mg | Freq: Two times a day (BID) | INTRAVENOUS | Status: DC
  Administered 2015-12-05 – 2015-12-09 (×8): 400 mg via INTRAVENOUS
  Filled 2015-12-05 (×7): qty 200

## 2015-12-05 MED ORDER — SODIUM CHLORIDE 0.9 % IV SOLN
Freq: Once | INTRAVENOUS | Status: AC
  Administered 2015-12-05: 16:00:00 via INTRAVENOUS

## 2015-12-05 MED ORDER — VITAMIN B-12 100 MCG PO TABS
100.0000 ug | ORAL_TABLET | Freq: Every day | ORAL | Status: DC
  Administered 2015-12-07 – 2015-12-09 (×3): 100 ug via ORAL
  Filled 2015-12-05 (×4): qty 1

## 2015-12-05 MED ORDER — MORPHINE SULFATE (PF) 4 MG/ML IV SOLN
4.0000 mg | INTRAVENOUS | Status: DC | PRN
  Administered 2015-12-05 – 2015-12-07 (×4): 4 mg via INTRAVENOUS
  Filled 2015-12-05 (×4): qty 1

## 2015-12-05 NOTE — ED Provider Notes (Signed)
CSN: [id number]     Arrival date & time 12/05/15  1043 History   First MD Initiated Contact with Patient 12/05/15 1305     Chief Complaint  Patient presents with  . Abdominal Pain  . Hematuria     (Consider location/radiation/quality/duration/timing/severity/associated sxs/prior Treatment) Patient is a 65 y.o. female presenting with abdominal pain and hematuria. The history is provided by the patient.  Abdominal Pain Pain location:  Epigastric, RUQ and LUQ Pain quality: aching   Pain radiates to:  Epigastric region, RUQ and LUQ Onset quality:  Gradual Duration:  4 days Timing:  Intermittent Progression:  Waxing and waning Chronicity:  New Context: not alcohol use (remote)   Relieved by: hydrocodone. Worsened by:  Nothing tried Ineffective treatments:  None tried Associated symptoms: no fever and no hematuria (patient with darkening of urine)   Risk factors: being elderly   Risk factors comment:  Cirrhosis from previous alcohol abuse Hematuria Associated symptoms include abdominal pain.    Past Medical History  Diagnosis Date  . Hypertension   . Thyroid disease     S/p Radiation  . [place] 03/16/2010  . Hypothyroidism   . Liver cirrhosis [facility])    Past Surgical History  Procedure Laterality Date  . Im nailing tibia Right 07/19/2012    DR ALUSIO  . Tubal ligation    . Tibia im nail insertion Right 07/19/2013    Procedure: INTRAMEDULLARY (IM) NAIL TIBIAL;  Surgeon: Gearlean Alf, MD;  Location: [place];  Service: Orthopedics;  Laterality: Right;  . Shoulder hemi-arthroplasty Right 07/22/2013    Procedure: RIGHT SHOULDER HEMI-ARTHROPLASTY;  Surgeon: Augustin Schooling, MD;  Location: [place];  Service: Orthopedics;  Laterality: Right;   No family history on file. Social History  Substance Use Topics  . Smoking status: Current Every Day Smoker -- 1.00 packs/day for 40 years    Types: Cigarettes  . Smokeless tobacco: Never Used  . Alcohol Use: No     Comment: QUIT  DRINKING IN 2002   OB History    No data available     Review of Systems  Constitutional: Negative for fever.  Gastrointestinal: Positive for abdominal pain.  Genitourinary: Negative for hematuria (patient with darkening of urine).  All other systems reviewed and are negative.     Allergies  Oxycodone hcl  Home Medications   Prior to Admission medications   Medication Sig Start Date End Date Taking? Authorizing Provider  calcium-vitamin D (OSCAL WITH D) 500-200 MG-UNIT per tablet Take 1 tablet by mouth daily.    Yes Historical Provider, MD  citalopram (CELEXA) 20 MG tablet TAKE 1 TABLET BY MOUTH EVERY DAY. 03/17/14  Yes Patrecia Pour, MD  Cyanocobalamin (VITAMIN B12 PO) Take 1 tablet by mouth daily.   Yes Historical Provider, MD  diphenhydrAMINE (BENADRYL) 25 mg capsule Take 25-50 mg by mouth every 6 (six) hours as needed for sleep.   Yes Historical Provider, MD  Flaxseed, Linseed, (FLAX SEEDS PO) Take 1 tablet by mouth daily.   Yes Historical Provider, MD  folic acid (FOLVITE) A999333 MCG tablet Take 400 mcg by mouth daily.   Yes Historical Provider, MD  HYDROcodone-acetaminophen (NORCO/VICODIN) 5-325 MG tablet Take 1 tablet by mouth every 4 (four) hours as needed. Patient taking differently: Take 1 tablet by mouth every 4 (four) hours as needed for moderate pain.  12/02/15  Yes Isla Pence, MD  levothyroxine (SYNTHROID, LEVOTHROID) 125 MCG tablet Take 125 mcg by mouth daily before breakfast.   Yes Historical  Provider, MD  lisinopril-hydrochlorothiazide (PRINZIDE,ZESTORETIC) 20-12.5 MG tablet Take 1 tablet by mouth daily.   Yes Historical Provider, MD  Multiple Vitamins-Minerals (MULTIVITAMIN & MINERAL PO) Take 1 tablet by mouth daily.   Yes Historical Provider, MD  Omega-3 Fatty Acids (FISH OIL) 1000 MG CAPS Take 1 capsule by mouth daily.   Yes Historical Provider, MD  ondansetron (ZOFRAN) 4 MG tablet Take 1 tablet (4 mg total) by mouth every 6 (six) hours. 12/03/15  Yes Isla Pence,  MD  pantoprazole (PROTONIX) 20 MG tablet Take 1 tablet (20 mg total) by mouth daily. 12/02/15  Yes Isla Pence, MD  vitamin E 400 UNIT capsule Take 400 Units by mouth daily.   Yes Historical Provider, MD  enoxaparin (LOVENOX) 40 MG/0.4ML injection Inject 0.4 mLs (40 mg total) into the skin daily. Take Lovenox injections for seven more days and then change to a full dose 325 mg Aspirin daily for four weeks. Patient not taking: Reported on 12/02/2015 07/24/13   Arlee Muslim, PA-C   BP 159/84 mmHg  Pulse 60  Temp(Src) 98.3 F (36.8 C) (Oral)  Resp 16  SpO2 96% Physical Exam  Constitutional: She is oriented to person, place, and time. She appears well-developed and well-nourished. No distress.  HENT:  Head: Normocephalic.  Eyes: Conjunctivae are normal.  Neck: Neck supple. No tracheal deviation present.  Cardiovascular: Normal rate, regular rhythm and normal heart sounds.   Pulmonary/Chest: Effort normal and breath sounds normal. No respiratory distress.  Abdominal: Soft. She exhibits no distension. There is tenderness in the epigastric area. There is no rigidity, no rebound and no guarding.  Neurological: She is alert and oriented to person, place, and time.  Skin: Skin is warm and dry.  jaundiced  Psychiatric: She has a normal mood and affect.  Vitals reviewed.   ED Course  Procedures (including critical care time) Labs Review Labs Reviewed  LIPASE, BLOOD - Abnormal; Notable for the following:    Lipase 1498 (*)    All other components within normal limits  COMPREHENSIVE METABOLIC PANEL - Abnormal; Notable for the following:    Sodium 134 (*)    Potassium 3.1 (*)    Chloride 98 (*)    Glucose, Bld 103 (*)    AST 65 (*)    ALT 178 (*)    Alkaline Phosphatase 178 (*)    Total Bilirubin 9.5 (*)    All other components within normal limits  CBC - Abnormal; Notable for the following:    HCT 32.9 (*)    MCHC 36.8 (*)    All other components within normal limits  URINALYSIS,  ROUTINE W REFLEX MICROSCOPIC (NOT AT [facility]) - Abnormal; Notable for the following:    Color, Urine ORANGE (*)    Bilirubin Urine LARGE (*)    Leukocytes, UA TRACE (*)    All other components within normal limits  URINE MICROSCOPIC-ADD ON - Abnormal; Notable for the following:    Squamous Epithelial / LPF 0-5 (*)    Bacteria, UA RARE (*)    All other components within normal limits    Imaging Review Ct Abdomen Pelvis W Contrast  12/05/2015  CLINICAL DATA:  Upper abdominal pain EXAM: CT ABDOMEN AND PELVIS WITH CONTRAST TECHNIQUE: Multidetector CT imaging of the abdomen and pelvis was performed using the standard protocol following bolus administration of intravenous contrast. CONTRAST:  180mL ISOVUE-300 IOPAMIDOL (ISOVUE-300) INJECTION 61% COMPARISON:  None. FINDINGS: Lower chest: Calcified granuloma identified in the anterior right lung base. Subsegmental atelectasis noted  dependently in the lower lobes bilaterally. Hepatobiliary: Heterogeneous perfusion of the liver parenchyma is evident and liver contour is nodular, specially along the inferior right hepatic lobe. There is marked intra and extrahepatic biliary duct dilatation. The common bile duct measures up to 18 mm pancreatic head. Numerous calcified gallstones measure in the 4-7 mm size range. Gallbladder lumen is filled fluid and central material which may be tumefactive sludge. Gallbladder neoplasm considered less likely. 5 mm stone is identified in the region of the common bile duct/ main pancreatic duct confluence at the ampulla (best seen on coronal image 62 of series 5). Pancreas: Diffuse distention of the main pancreatic duct is noted. No discrete pancreatic head mass is evident. Spleen: No splenomegaly. No focal mass lesion. Adrenals/Urinary Tract: No adrenal nodule or mass. 10 mm exophytic lesion in the upper pole left kidney (see image 108 series 5) has attenuation higher than be expected for a simple cyst. Enhancement in this lesion  cannot be excluded. Small low-density lesions in the lower pole of the left kidney are probably cysts. No hydronephrosis. No evidence for hydroureter. No ureteral or bladder stones. Stomach/Bowel: Stomach is nondistended. No gastric wall thickening. No evidence of outlet obstruction. Duodenum is normally positioned as is the ligament of Treitz. No small bowel wall thickening. No small bowel dilatation. The terminal ileum is normal. Vascular/Lymphatic: There is abdominal aortic atherosclerosis without aneurysm. There is no gastrohepatic or hepatoduodenal ligament lymphadenopathy. No intraperitoneal or retroperitoneal lymphadenopathy. No pelvic sidewall lymphadenopathy. Reproductive: The uterus has normal CT imaging appearance. There is no adnexal mass. Other: No intraperitoneal free fluid. Musculoskeletal: Loss vertebral body height at T12 may be related to inferior endplate compression. IMPRESSION: 1. Marked intra and extrahepatic biliary duct dilatation is associated with diffuse dilatation of the main pancreatic duct. These changes appear to be secondary to a 5 mm stone at the ampulla. No discrete pancreatic head mass is evident on today's study. ERCP may prove helpful to further evaluate. 2. Distended gallbladder with numerous calcified stones in the lumen. Noncalcified debris in the lumen of the gallbladder is probably tumefactive sludge. Gallbladder carcinoma is considered less likely. Follow-up recommended. 3. 10 mm high density lesion in the upper pole the left kidney. Small renal cell carcinoma not excluded. After resolution of acute symptoms, consider MRI without with contrast to further evaluate. 4. Abdominal aortic atherosclerosis. 5. Question mild inferior endplate compression fracture at T12. Electronically Signed   By: Misty Stanley M.D.   On: 12/05/2015 15:11   I have personally reviewed and evaluated these images and lab results as part of my medical decision-making.   EKG Interpretation None       MDM   Final diagnoses:  Abdominal pain  Acute gallstone pancreatitis  Biliary obstruction    65 y.o. female presents with second visit for acute pancreatitis after leaving [facility]. Lipase and transaminitis improving but bili is increasing suggesting obstructive pathology. D/w GI who recommended CT over [place] as initial imaging study to more formally evaluate the pancreas. CT shows obstructing gallstone as likely etiology and will require ERCP. Hospitalist was consulted for admission and will see the patient in the emergency department.     Leo Grosser, MD 12/06/15 [phone]

## 2015-12-05 NOTE — ED Notes (Signed)
Pt transported to CT ?

## 2015-12-05 NOTE — H&P (Signed)
History and Physical  [name] [id number] DOB: 04-04-1951 DOA: 12/05/2015  Referring physician: ER physician PCP: Vance Gather, MD  Outpatient Specialists:    Patient coming from: Home  Chief Complaint: Abdominal pain and jaundice  HPI: 66 year old female with history of hypertension, documented liver cirrhosis and hypothyroidism. Patient initially presented to the ER 3 days ago with abdominal pain, severe nausea and vomiting, abnormal LFT's and severe jaundice but refused to be admitted. Patient continued to have abdominal pain, nausea and vomiting at home and decided to represent today. No fever or chills, no headache, no neck pain, no chest pain, no SOB, no urinary symptoms. CT scan done reveal obstructive picture with gall bladder disease. Bilirubin and other LFT's remain abnormal. Potassium is 3.1.  ED Course: Initial resuscitation and CT scan  Pertinent labs: Elevated LFT's. Abnormal CT abdomen.  Review of Systems: As in HPI. Negative for fever, visual changes, sore throat, rash, new muscle aches, chest pain, SOB, dysuria, bleeding.  Past Medical History  Diagnosis Date  . Hypertension   . Thyroid disease     S/p Radiation  . ACROCHORDON 03/16/2010  . Hypothyroidism   . Liver cirrhosis [facility])     Past Surgical History  Procedure Laterality Date  . Im nailing tibia Right 07/19/2012    DR ALUSIO  . Tubal ligation    . Tibia im nail insertion Right 07/19/2013    Procedure: INTRAMEDULLARY (IM) NAIL TIBIAL;  Surgeon: Gearlean Alf, MD;  Location: [place];  Service: Orthopedics;  Laterality: Right;  . Shoulder hemi-arthroplasty Right 07/22/2013    Procedure: RIGHT SHOULDER HEMI-ARTHROPLASTY;  Surgeon: Augustin Schooling, MD;  Location: [place];  Service: Orthopedics;  Laterality: Right;     reports that she has been smoking Cigarettes.  She has a 40 pack-year smoking history. She has never used smokeless tobacco. She reports that she does not drink alcohol or use illicit  drugs.  Allergies  Allergen Reactions  . Oxycodone Hcl Other (See Comments)    REACTION: hallucinations    No family history on file.   Prior to Admission medications   Medication Sig Start Date End Date Taking? Authorizing Provider  calcium-vitamin D (OSCAL WITH D) 500-200 MG-UNIT per tablet Take 1 tablet by mouth daily.    Yes Historical Provider, MD  citalopram (CELEXA) 20 MG tablet TAKE 1 TABLET BY MOUTH EVERY DAY. 03/17/14  Yes Patrecia Pour, MD  Cyanocobalamin (VITAMIN B12 PO) Take 1 tablet by mouth daily.   Yes Historical Provider, MD  diphenhydrAMINE (BENADRYL) 25 mg capsule Take 25-50 mg by mouth every 6 (six) hours as needed for sleep.   Yes Historical Provider, MD  Flaxseed, Linseed, (FLAX SEEDS PO) Take 1 tablet by mouth daily.   Yes Historical Provider, MD  folic acid (FOLVITE) A999333 MCG tablet Take 400 mcg by mouth daily.   Yes Historical Provider, MD  HYDROcodone-acetaminophen (NORCO/VICODIN) 5-325 MG tablet Take 1 tablet by mouth every 4 (four) hours as needed. Patient taking differently: Take 1 tablet by mouth every 4 (four) hours as needed for moderate pain.  12/02/15  Yes Isla Pence, MD  levothyroxine (SYNTHROID, LEVOTHROID) 125 MCG tablet Take 125 mcg by mouth daily before breakfast.   Yes Historical Provider, MD  lisinopril-hydrochlorothiazide (PRINZIDE,ZESTORETIC) 20-12.5 MG tablet Take 1 tablet by mouth daily.   Yes Historical Provider, MD  Multiple Vitamins-Minerals (MULTIVITAMIN & MINERAL PO) Take 1 tablet by mouth daily.   Yes Historical Provider, MD  Omega-3 Fatty Acids ([place]  OIL) 1000 MG CAPS Take 1 capsule by mouth daily.   Yes Historical Provider, MD  ondansetron (ZOFRAN) 4 MG tablet Take 1 tablet (4 mg total) by mouth every 6 (six) hours. 12/03/15  Yes Isla Pence, MD  pantoprazole (PROTONIX) 20 MG tablet Take 1 tablet (20 mg total) by mouth daily. 12/02/15  Yes Isla Pence, MD  vitamin E 400 UNIT capsule Take 400 Units by mouth daily.   Yes Historical  Provider, MD    Physical Exam: Filed Vitals:   12/05/15 1050 12/05/15 1212 12/05/15 1353 12/05/15 1511  BP: 153/88 159/84 155/85 188/86  Pulse: 77 60 55 58  Temp: 98.3 F (36.8 C)     TempSrc: Oral     Resp: 16 16 14 16   SpO2: 97% 96% 96% 97%    Constitutional:  . Jaundiced. Appears calm and comfortable Eyes:  . No pallor. Jaundice.  ENMT:  . external ears, nose appear normal Neck:  . Neck is supple. No JVD Respiratory:  . CTA bilaterally, no w/r/r.  . Respiratory effort normal. No retractions or accessory muscle use Cardiovascular:  . Systolic murmur . No LE extremity edema   Abdomen:  . Abdomen is tender. Organs are difficult to assess. Neurologic:  . Awake and alert. . Moves all limbs.  Wt Readings from Last 3 Encounters:  07/18/13 85.73 kg (189 lb)  05/18/13 86.002 kg (189 lb 9.6 oz)  04/13/13 85.367 kg (188 lb 3.2 oz)    I have personally reviewed following labs and imaging studies  Labs on Admission:  CBC:  Recent Labs Lab 12/02/15 1939 12/05/15 1136  WBC 13.0* 8.4  HGB 13.7 12.1  HCT 37.0 32.9*  MCV 84.9 84.1  PLT 175 [id number]   Basic Metabolic Panel:  Recent Labs Lab 12/02/15 1939 12/05/15 1136  NA 134* 134*  K 3.2* 3.1*  CL 99* 98*  CO2 26 28  GLUCOSE 180* 103*  BUN 18 10  CREATININE 0.75 0.47  CALCIUM 9.2 9.1   Liver Function Tests:  Recent Labs Lab 12/02/15 1939 12/05/15 1136  AST 266* 65*  ALT 415* 178*  ALKPHOS 139* 178*  BILITOT 9.1* 9.5*  PROT 7.3 7.1  ALBUMIN 4.2 3.9    Recent Labs Lab 12/02/15 1939 12/05/15 1136  LIPASE 3265* 1498*   No results for input(s): AMMONIA in the last 168 hours. Coagulation Profile: No results for input(s): INR, PROTIME in the last 168 hours. Cardiac Enzymes: No results for input(s): CKTOTAL, CKMB, CKMBINDEX, TROPONINI in the last 168 hours. BNP (last 3 results) No results for input(s): PROBNP in the last 8760 hours. HbA1C: No results for input(s): HGBA1C in the last 72  hours. CBG: No results for input(s): GLUCAP in the last 168 hours. Lipid Profile: No results for input(s): CHOL, HDL, LDLCALC, TRIG, CHOLHDL, LDLDIRECT in the last 72 hours. Thyroid Function Tests: No results for input(s): TSH, T4TOTAL, FREET4, T3FREE, THYROIDAB in the last 72 hours. Anemia Panel: No results for input(s): VITAMINB12, FOLATE, FERRITIN, TIBC, IRON, RETICCTPCT in the last 72 hours. Urine analysis:    Component Value Date/Time   COLORURINE ORANGE* 12/05/2015 1230   APPEARANCEUR CLEAR 12/05/2015 1230   LABSPEC 1.014 12/05/2015 1230   PHURINE 6.5 12/05/2015 1230   GLUCOSEU NEGATIVE 12/05/2015 1230   HGBUR NEGATIVE 12/05/2015 1230   BILIRUBINUR LARGE* 12/05/2015 1230   KETONESUR NEGATIVE 12/05/2015 1230   PROTEINUR NEGATIVE 12/05/2015 1230   NITRITE NEGATIVE 12/05/2015 1230   LEUKOCYTESUR TRACE* 12/05/2015 1230   Sepsis Labs: @LABRCNTIP (procalcitonin:4,lacticidven:4) )No  results found for this or any previous visit (from the past 240 hour(s)).    Radiological Exams on Admission: Ct Abdomen Pelvis W Contrast  12/05/2015  CLINICAL DATA:  Upper abdominal pain EXAM: CT ABDOMEN AND PELVIS WITH CONTRAST TECHNIQUE: Multidetector CT imaging of the abdomen and pelvis was performed using the standard protocol following bolus administration of intravenous contrast. CONTRAST:  162mL ISOVUE-300 IOPAMIDOL (ISOVUE-300) INJECTION 61% COMPARISON:  None. FINDINGS: Lower chest: Calcified granuloma identified in the anterior right lung base. Subsegmental atelectasis noted dependently in the lower lobes bilaterally. Hepatobiliary: Heterogeneous perfusion of the liver parenchyma is evident and liver contour is nodular, specially along the inferior right hepatic lobe. There is marked intra and extrahepatic biliary duct dilatation. The common bile duct measures up to 18 mm pancreatic head. Numerous calcified gallstones measure in the 4-7 mm size range. Gallbladder lumen is filled fluid and central  material which may be tumefactive sludge. Gallbladder neoplasm considered less likely. 5 mm stone is identified in the region of the common bile duct/ main pancreatic duct confluence at the ampulla (best seen on coronal image 62 of series 5). Pancreas: Diffuse distention of the main pancreatic duct is noted. No discrete pancreatic head mass is evident. Spleen: No splenomegaly. No focal mass lesion. Adrenals/Urinary Tract: No adrenal nodule or mass. 10 mm exophytic lesion in the upper pole left kidney (see image 108 series 5) has attenuation higher than be expected for a simple cyst. Enhancement in this lesion cannot be excluded. Small low-density lesions in the lower pole of the left kidney are probably cysts. No hydronephrosis. No evidence for hydroureter. No ureteral or bladder stones. Stomach/Bowel: Stomach is nondistended. No gastric wall thickening. No evidence of outlet obstruction. Duodenum is normally positioned as is the ligament of Treitz. No small bowel wall thickening. No small bowel dilatation. The terminal ileum is normal. Vascular/Lymphatic: There is abdominal aortic atherosclerosis without aneurysm. There is no gastrohepatic or hepatoduodenal ligament lymphadenopathy. No intraperitoneal or retroperitoneal lymphadenopathy. No pelvic sidewall lymphadenopathy. Reproductive: The uterus has normal CT imaging appearance. There is no adnexal mass. Other: No intraperitoneal free fluid. Musculoskeletal: Loss vertebral body height at T12 may be related to inferior endplate compression. IMPRESSION: 1. Marked intra and extrahepatic biliary duct dilatation is associated with diffuse dilatation of the main pancreatic duct. These changes appear to be secondary to a 5 mm stone at the ampulla. No discrete pancreatic head mass is evident on today's study. ERCP may prove helpful to further evaluate. 2. Distended gallbladder with numerous calcified stones in the lumen. Noncalcified debris in the lumen of the gallbladder  is probably tumefactive sludge. Gallbladder carcinoma is considered less likely. Follow-up recommended. 3. 10 mm high density lesion in the upper pole the left kidney. Small renal cell carcinoma not excluded. After resolution of acute symptoms, consider MRI without with contrast to further evaluate. 4. Abdominal aortic atherosclerosis. 5. Question mild inferior endplate compression fracture at T12. Electronically Signed   By: Misty Stanley M.D.   On: 12/05/2015 15:11    Active Problems:   Gallstone pancreatitis   Acute pancreatitis   Assessment/Plan 1. Acute pancreatitis likely secondary to gallstone with gall bladder disease 2. Hypertension 3. Hypokalemia   Admit patient  NPO  GI consult (ERCP planned)  Hydrate patient  IV antibiotics  Supportive care  Optimize BP  Monitor electrolytes and replete  DVT prophylaxis: Lovenox Code Status: Full Family Communication:  Disposition Plan: Home   Consults called: GI   Admission status: Inpatient  Time spent: 60 minutes  Dana Allan, MD  [facility] Pager #: [phone] 7PM-7AM contact night coverage as above   12/05/2015, 4:17 PM

## 2015-12-05 NOTE — ED Notes (Signed)
Pt reports being seen here Friday for same, was found to have pancreatitis flare up, gastritis GI hemorrhage.  States she was supposed to be admitted but she did not want to.  Was given referral to GI, she called today this am but states they are not able to see her Wednesday.  Pt reports her stool is not dark but still has hematuria.  Pt reports Friday, she had diarrhea which has resolved.  Pt also reports taking hydrocodone at 1100 for pain.

## 2015-12-05 NOTE — Consult Note (Signed)
Consultation  Referring Provider:   Dr. Marthenia Rolling   Primary Care Physician:  Vance Gather, MD Primary Gastroenterologist:   Dr. Silverio Decamp      Reason for Consultation: Choledocholithiasis              HPI:   [name] is a 65 y.o. Caucasian female with a past medical history significant for hypertension, hypothyroidism and liver cirrhosis who originally presented to the ER on 12/02/2015 for a complaint of black stools, diarrhea and vomiting all through the night on Thursday night, 12/01/15. At that time the patient was found to have elevated liver enzymes as well as an elevated lipase at 3265. It was recommended the patient stay and be admitted, but she decided to leave AMA.  Today, the patient re-presented to the ER. She explains that over the weekend she continued with some right upper quadrant pain which occurred "off and on", and was not made worse or better by anything. She also continued with "dark-looking urine", and nausea which occurred off and on. Patient tells me that since Friday her stools have "lightened up", and become "muddy in form". She tells me she did have some chills over the weekend but no documented fever. The patient explains that she did eat supper last night and had no problems, but does have a decreased appetite. She admits to knowledge of her cirrhosis due to history of alcohol abuse in the past. Her last drink was in 2003. The patient tells me she did call our clinic today and was unable to get in until Wednesday, so she decided to come back to the ER.  Last GI procedures were and 2009 including a colonoscopy and EGD.  ER course:  -CT of the abdomen and pelvis shows marked intra-and extrahepatic biliary ductal dilatation is associated with diffuse dilatation of the main pancreatic duct. These changes appear to be secondary to a 5 mm stone at the ampulla. No discrete pancreatic head mass is evident on today's study. Descending gallbladder with numerous calcified  stones in the lumen. Noncalcified debris in the lumen of the gallbladder probably sludge. Gallbladder carcinoma is considered less likely. 10 mm high-density lesion upper pole of left kidney. Small renal cell carcinoma not excluded. Abdominal aortic atherosclerosis. Questionable mild inferior endplate compression fracture T12. -Labs: Sodium decreased at 134, potassium decreased at 3.1, chloride decreased at 98, lipase elevated at 1498, AST elevated at 65, ALT elevated at 178 and total bilirubin elevated at 9.5. This is compared with a lipase of 3265 on 12/02/2015, AST 266, ALT 415 and total bilirubin 9.1. Currently patient does not have a leukocytosis, it is 8.4 compared with 13.01 12/02/2015. Platelets normal at 168.  Past Medical History  Diagnosis Date  . Hypertension   . Thyroid disease     S/p Radiation  . ACROCHORDON 03/16/2010  . Hypothyroidism   . Liver cirrhosis [facility])     Past Surgical History  Procedure Laterality Date  . Im nailing tibia Right 07/19/2012    DR ALUSIO  . Tubal ligation    . Tibia im nail insertion Right 07/19/2013    Procedure: INTRAMEDULLARY (IM) NAIL TIBIAL;  Surgeon: Gearlean Alf, MD;  Location: [facility];  Service: Orthopedics;  Laterality: Right;  . Shoulder hemi-arthroplasty Right 07/22/2013    Procedure: RIGHT SHOULDER HEMI-ARTHROPLASTY;  Surgeon: Augustin Schooling, MD;  Location: [facility];  Service: Orthopedics;  Laterality: Right;    Patient admits to family history of colon polyps in her brother;  denies family history of colon cancer, breast cancer ovarian cancer, IBD or, liver or pancreatic disease.  Social History  Substance Use Topics  . Smoking status: Current Every Day Smoker -- 1.00 packs/day for 40 years    Types: Cigarettes  . Smokeless tobacco: Never Used  . Alcohol Use: No     Comment: QUIT DRINKING IN 2002    Prior to Admission medications   Medication Sig Start Date End Date Taking? Authorizing Provider  calcium-vitamin D (OSCAL WITH D)  500-200 MG-UNIT per tablet Take 1 tablet by mouth daily.    Yes Historical Provider, MD  citalopram (CELEXA) 20 MG tablet TAKE 1 TABLET BY MOUTH EVERY DAY. 03/17/14  Yes Patrecia Pour, MD  Cyanocobalamin (VITAMIN B12 PO) Take 1 tablet by mouth daily.   Yes Historical Provider, MD  diphenhydrAMINE (BENADRYL) 25 mg capsule Take 25-50 mg by mouth every 6 (six) hours as needed for sleep.   Yes Historical Provider, MD  Flaxseed, Linseed, (FLAX SEEDS PO) Take 1 tablet by mouth daily.   Yes Historical Provider, MD  folic acid (FOLVITE) A999333 MCG tablet Take 400 mcg by mouth daily.   Yes Historical Provider, MD  HYDROcodone-acetaminophen (NORCO/VICODIN) 5-325 MG tablet Take 1 tablet by mouth every 4 (four) hours as needed. Patient taking differently: Take 1 tablet by mouth every 4 (four) hours as needed for moderate pain.  12/02/15  Yes Isla Pence, MD  levothyroxine (SYNTHROID, LEVOTHROID) 125 MCG tablet Take 125 mcg by mouth daily before breakfast.   Yes Historical Provider, MD  lisinopril-hydrochlorothiazide (PRINZIDE,ZESTORETIC) 20-12.5 MG tablet Take 1 tablet by mouth daily.   Yes Historical Provider, MD  Multiple Vitamins-Minerals (MULTIVITAMIN & MINERAL PO) Take 1 tablet by mouth daily.   Yes Historical Provider, MD  Omega-3 Fatty Acids (FISH OIL) 1000 MG CAPS Take 1 capsule by mouth daily.   Yes Historical Provider, MD  ondansetron (ZOFRAN) 4 MG tablet Take 1 tablet (4 mg total) by mouth every 6 (six) hours. 12/03/15  Yes Isla Pence, MD  pantoprazole (PROTONIX) 20 MG tablet Take 1 tablet (20 mg total) by mouth daily. 12/02/15  Yes Isla Pence, MD  vitamin E 400 UNIT capsule Take 400 Units by mouth daily.   Yes Historical Provider, MD    Current Facility-Administered Medications  Medication Dose Route Frequency Provider Last Rate Last Dose  . citalopram (CELEXA) tablet 20 mg  20 mg Oral Daily Bonnell Public, MD      . enoxaparin (LOVENOX) injection 40 mg  40 mg Subcutaneous Q24H Bonnell Public, MD      . fentaNYL (SUBLIMAZE) injection 25 mcg  25 mcg Intravenous Q1H PRN Leo Grosser, MD   25 mcg at 12/05/15 1552  . [START ON 12/06/2015] levothyroxine (SYNTHROID, LEVOTHROID) tablet 125 mcg  125 mcg Oral QAC breakfast Bonnell Public, MD      . morphine 4 MG/ML injection 4 mg  4 mg Intravenous Q4H PRN Bonnell Public, MD      . pantoprazole (PROTONIX) EC tablet 20 mg  20 mg Oral Daily Bonnell Public, MD      . vitamin B-12 (CYANOCOBALAMIN) tablet 100 mcg  100 mcg Oral Daily Bonnell Public, MD       Current Outpatient Prescriptions  Medication Sig Dispense Refill  . calcium-vitamin D (OSCAL WITH D) 500-200 MG-UNIT per tablet Take 1 tablet by mouth daily.     . citalopram (CELEXA) 20 MG tablet TAKE 1 TABLET BY MOUTH EVERY DAY. [place]  tablet 0  . Cyanocobalamin (VITAMIN B12 PO) Take 1 tablet by mouth daily.    . diphenhydrAMINE (BENADRYL) 25 mg capsule Take 25-50 mg by mouth every 6 (six) hours as needed for sleep.    . Flaxseed, Linseed, (FLAX SEEDS PO) Take 1 tablet by mouth daily.    . folic acid (FOLVITE) A999333 MCG tablet Take 400 mcg by mouth daily.    Marland Kitchen HYDROcodone-acetaminophen (NORCO/VICODIN) 5-325 MG tablet Take 1 tablet by mouth every 4 (four) hours as needed. (Patient taking differently: Take 1 tablet by mouth every 4 (four) hours as needed for moderate pain. ) 10 tablet 0  . levothyroxine (SYNTHROID, LEVOTHROID) 125 MCG tablet Take 125 mcg by mouth daily before breakfast.    . lisinopril-hydrochlorothiazide (PRINZIDE,ZESTORETIC) 20-12.5 MG tablet Take 1 tablet by mouth daily.    . Multiple Vitamins-Minerals (MULTIVITAMIN & MINERAL PO) Take 1 tablet by mouth daily.    . Omega-3 Fatty Acids (FISH OIL) 1000 MG CAPS Take 1 capsule by mouth daily.    . ondansetron (ZOFRAN) 4 MG tablet Take 1 tablet (4 mg total) by mouth every 6 (six) hours. 12 tablet 0  . pantoprazole (PROTONIX) 20 MG tablet Take 1 tablet (20 mg total) by mouth daily. 30 tablet 0  . vitamin E 400 UNIT  capsule Take 400 Units by mouth daily.      Allergies as of 12/05/2015 - Review Complete 12/05/2015  Allergen Reaction Noted  . Oxycodone hcl Other (See Comments) 11/19/2006     Review of Systems:    Constitutional: Positive for chills No weight loss, fever, weakness or fatigue HEENT: Eyes: No visual loss, blurred vision, double vision                Ears, Nose, Throat:  No hearing loss, sneezing. Congestion, runny nose or sore throat Skin: No rash or itching Cardiovascular: No chest pain, chest pressure or chest discomfort. No palpitations or edema Respiratory: No SOB, cough or sputum Gastrointestinal: See HPI and otherwise negative Genitourinary: No dysuria or change in urinary frequency Neurological: No headache, dizziness, syncope, numbness or tingling in the extremities Musculoskeletal: No muscle, back pain, joint pain or stiffness. Hematologic: No anemia, bleeding or bruising Lymphatics: No enlarged lymph nodes or history of splenectomy Psychiatric: No history of depression or anxiety Endocrinologic: No reports of sweating, cold or heat intolerance, poyluria or polydypsia Allergies: No history of asthma, hives or eczema    Physical Exam:  Vital signs in last 24 hours: Temp:  [98.1 F (36.7 C)-98.3 F (36.8 C)] 98.1 F (36.7 C) (06/12 1631) Pulse Rate:  [55-77] 60 (06/12 1631) Resp:  [14-16] 16 (06/12 1631) BP: (139-188)/(84-88) 139/85 mmHg (06/12 1631) SpO2:  [96 %-97 %] 96 % (06/12 1631)   General:   Pleasant Caucasian female appears to be in NAD, Well developed, Well nourished, alert and cooperative Head:  Normocephalic and atraumatic. Eyes:   PEERL, EOMI. Icteric. Conjunctiva pink. Ears:  Normal auditory acuity. Neck:  Supple Throat: Oral cavity and pharynx without inflammation, swelling or lesion.  Lungs: Respirations even and unlabored. Lungs clear to auscultation bilaterally.   No wheezes, crackles, or rhonchi.  Heart: Normal S1, S2. No MRG. Regular rate and  rhythm. No peripheral edema, cyanosis or pallor.  Abdomen:  Soft, nondistended, mild tenderness to palpation in the right upper quadrant. No rebound or guarding. Normal bowel sounds. No appreciable masses or hepatomegaly. No abdominal distension.  Rectal:  Not performed.  Msk:  Symmetrical without gross deformities. Peripheral pulses intact.  Extremities:  Without edema, no deformity or joint abnormality. Normal ROM, normal sensation. Neurologic:  Alert and  oriented x4;  grossly normal neurologically. CN II-XII intact.  Skin:   Dry and intact without significant lesions or rashes. Psychiatric: Oriented to person, place and time. Demonstrates good judgement and reason without abnormal affect or behaviors.  LAB RESULTS:  Recent Labs  12/02/15 1939 12/05/15 1136  WBC 13.0* 8.4  HGB 13.7 12.1  HCT 37.0 32.9*  PLT 175 168   BMET  Recent Labs  12/02/15 1939 12/05/15 1136  NA 134* 134*  K 3.2* 3.1*  CL 99* 98*  CO2 26 28  GLUCOSE 180* 103*  BUN 18 10  CREATININE 0.75 0.47  CALCIUM 9.2 9.1   LFT  Recent Labs  12/05/15 1136  PROT 7.1  ALBUMIN 3.9  AST 65*  ALT 178*  ALKPHOS 178*  BILITOT 9.5*   PT/INR No results for input(s): LABPROT, INR in the last 72 hours.  STUDIES: Ct Abdomen Pelvis W Contrast  12/05/2015  CLINICAL DATA:  Upper abdominal pain EXAM: CT ABDOMEN AND PELVIS WITH CONTRAST TECHNIQUE: Multidetector CT imaging of the abdomen and pelvis was performed using the standard protocol following bolus administration of intravenous contrast. CONTRAST:  198mL ISOVUE-300 IOPAMIDOL (ISOVUE-300) INJECTION 61% COMPARISON:  None. FINDINGS: Lower chest: Calcified granuloma identified in the anterior right lung base. Subsegmental atelectasis noted dependently in the lower lobes bilaterally. Hepatobiliary: Heterogeneous perfusion of the liver parenchyma is evident and liver contour is nodular, specially along the inferior right hepatic lobe. There is marked intra and  extrahepatic biliary duct dilatation. The common bile duct measures up to 18 mm pancreatic head. Numerous calcified gallstones measure in the 4-7 mm size range. Gallbladder lumen is filled fluid and central material which may be tumefactive sludge. Gallbladder neoplasm considered less likely. 5 mm stone is identified in the region of the common bile duct/ main pancreatic duct confluence at the ampulla (best seen on coronal image 62 of series 5). Pancreas: Diffuse distention of the main pancreatic duct is noted. No discrete pancreatic head mass is evident. Spleen: No splenomegaly. No focal mass lesion. Adrenals/Urinary Tract: No adrenal nodule or mass. 10 mm exophytic lesion in the upper pole left kidney (see image 108 series 5) has attenuation higher than be expected for a simple cyst. Enhancement in this lesion cannot be excluded. Small low-density lesions in the lower pole of the left kidney are probably cysts. No hydronephrosis. No evidence for hydroureter. No ureteral or bladder stones. Stomach/Bowel: Stomach is nondistended. No gastric wall thickening. No evidence of outlet obstruction. Duodenum is normally positioned as is the ligament of Treitz. No small bowel wall thickening. No small bowel dilatation. The terminal ileum is normal. Vascular/Lymphatic: There is abdominal aortic atherosclerosis without aneurysm. There is no gastrohepatic or hepatoduodenal ligament lymphadenopathy. No intraperitoneal or retroperitoneal lymphadenopathy. No pelvic sidewall lymphadenopathy. Reproductive: The uterus has normal CT imaging appearance. There is no adnexal mass. Other: No intraperitoneal free fluid. Musculoskeletal: Loss vertebral body height at T12 may be related to inferior endplate compression. IMPRESSION: 1. Marked intra and extrahepatic biliary duct dilatation is associated with diffuse dilatation of the main pancreatic duct. These changes appear to be secondary to a 5 mm stone at the ampulla. No discrete  pancreatic head mass is evident on today's study. ERCP may prove helpful to further evaluate. 2. Distended gallbladder with numerous calcified stones in the lumen. Noncalcified debris in the lumen of the gallbladder is probably tumefactive sludge. Gallbladder carcinoma is  considered less likely. Follow-up recommended. 3. 10 mm high density lesion in the upper pole the left kidney. Small renal cell carcinoma not excluded. After resolution of acute symptoms, consider MRI without with contrast to further evaluate. 4. Abdominal aortic atherosclerosis. 5. Question mild inferior endplate compression fracture at T12. Electronically Signed   By: Misty Stanley M.D.   On: 12/05/2015 15:11     PREVIOUS ENDOSCOPIES:            05/11/2008-Dr. Deatra Ina: Normal EGD from proximal esophagus to second portion of the duodenum 08/15/2006-Dr. Deatra Ina: Normal proximal esophagus, erythematous mucosa in the antrum with minimal erythema and edema in the area of a previous ulcer, normal exam from antrum to second portion of the duodenum 06/22/2006 colonoscopy-Dr. Fuller Plan: Normal exam   Impression / Plan:  Impression: 1. Gallstone pancreatitis: 64mm stone seen in CBD region: Elevated LFTS and Lipase 2. Elevated LFTS: See above 3. Elevated lipase: See above 4. H/o Cirrhosis: due to etoh use in the past, denies etoh use since 2003  Plan: 1. Will start Ciprofloxacin 400mg  IV BID 2. Ordered PT/INR 3. Discussed ERCP with the patient today including risks, benefits and limitations. She agrees to proceed. 4. Discussed case with Dr. Wilford Sports try to get pt on schedule tomorrow afternoon, if not able, will need to be done Wed/Thurs 5. NPO after midnight, clears tonight 6. Continue supportive measures 7. Will also discuss above with Dr. Havery Moros  Thank you for your kind consultation, we will continue to follow Ellouise Newer, PA-C  Thank you for your kind consultation, we will continue to follow.  Lavone Nian [facility]   12/05/2015, 4:35 PM

## 2015-12-05 NOTE — Progress Notes (Signed)
Utilization Review completed in Jamelle Haring RN CM

## 2015-12-05 NOTE — ED Notes (Signed)
Pt assisted to restroom for urine sample

## 2015-12-05 NOTE — Telephone Encounter (Signed)
Spoke with the patient. Offered first available appointment which is 12/07/15. She states she is actually thinking she should go back to the ER. She feels she needs to be admitted. She is in the "recall que" for 05/2016. She was last seen by [facility] in 2009.

## 2015-12-06 ENCOUNTER — Encounter (HOSPITAL_COMMUNITY)
Admission: EM | Disposition: A | Discharge status: Home / Self Care | Payer: Self-pay | Source: Home / Self Care | Attending: Internal Medicine

## 2015-12-06 ENCOUNTER — Inpatient Hospital Stay (HOSPITAL_COMMUNITY): Payer: Medicare Other | Admitting: Anesthesiology

## 2015-12-06 ENCOUNTER — Encounter (HOSPITAL_COMMUNITY): Payer: Self-pay

## 2015-12-06 ENCOUNTER — Inpatient Hospital Stay (HOSPITAL_COMMUNITY): Payer: Medicare Other

## 2015-12-06 HISTORY — PX: ERCP: SHX5425

## 2015-12-06 LAB — CBC
HCT: 30.5 % — ABNORMAL LOW (ref 36.0–46.0)
Hemoglobin: 11.3 g/dL — ABNORMAL LOW (ref 12.0–15.0)
MCH: 30.8 pg (ref 26.0–34.0)
MCHC: 37 g/dL — ABNORMAL HIGH (ref 30.0–36.0)
MCV: 83.1 fL (ref 78.0–100.0)
Platelets: 156 10*3/uL (ref 150–400)
RBC: 3.67 MIL/uL — ABNORMAL LOW (ref 3.87–5.11)
RDW: 13.4 % (ref 11.5–15.5)
WBC: 12 10*3/uL — ABNORMAL HIGH (ref 4.0–10.5)

## 2015-12-06 LAB — LIPID PANEL
Cholesterol: 163 mg/dL (ref 0–200)
HDL: 25 mg/dL — ABNORMAL LOW (ref >40)
LDL Cholesterol: 112 mg/dL — ABNORMAL HIGH (ref 0–99)
Total CHOL/HDL Ratio: 6.5 RATIO
Triglycerides: 131 mg/dL (ref <150)
VLDL: 26 mg/dL (ref 0–40)

## 2015-12-06 LAB — COMPREHENSIVE METABOLIC PANEL
ALT: 145 U/L — ABNORMAL HIGH (ref 14–54)
AST: 59 U/L — ABNORMAL HIGH (ref 15–41)
Albumin: 3.5 g/dL (ref 3.5–5.0)
Alkaline Phosphatase: 175 U/L — ABNORMAL HIGH (ref 38–126)
Anion gap: 10 (ref 5–15)
BUN: 9 mg/dL (ref 6–20)
CO2: 26 mmol/L (ref 22–32)
Calcium: 8.8 mg/dL — ABNORMAL LOW (ref 8.9–10.3)
Chloride: 97 mmol/L — ABNORMAL LOW (ref 101–111)
Creatinine, Ser: <0.3 mg/dL — ABNORMAL LOW (ref 0.44–1.00)
Glucose, Bld: 98 mg/dL (ref 65–99)
Potassium: 3.1 mmol/L — ABNORMAL LOW (ref 3.5–5.1)
Sodium: 133 mmol/L — ABNORMAL LOW (ref 135–145)
Total Bilirubin: 12.4 mg/dL — ABNORMAL HIGH (ref 0.3–1.2)
Total Protein: 6.3 g/dL — ABNORMAL LOW (ref 6.5–8.1)

## 2015-12-06 LAB — PROTIME-INR
INR: 1.17 (ref 0.00–1.49)
Prothrombin Time: 14.6 seconds (ref 11.6–15.2)

## 2015-12-06 LAB — LIPASE, BLOOD: Lipase: 1812 U/L — ABNORMAL HIGH (ref 11–51)

## 2015-12-06 IMAGING — RF DG ERCP WO/W SPHINCTEROTOMY
1 series · 5 of 5 positions shown · non-contrast
Comparison: None.

CLINICAL DATA: Gallstone will

EXAM:
ERCP
TECHNIQUE: Multiple spot images obtained with the fluoroscopic device and
submitted for interpretation post-procedure.
FLUOROSCOPY TIME:  Radiation Exposure Index (as provided by the
fluoroscopic device):
If the device does not provide the exposure index:
Fluoroscopy Time:  3 minutes and 48 seconds
Number of Acquired Images:  5

[Series 1: run · 5 of 5 slices shown]
[im 1/5]
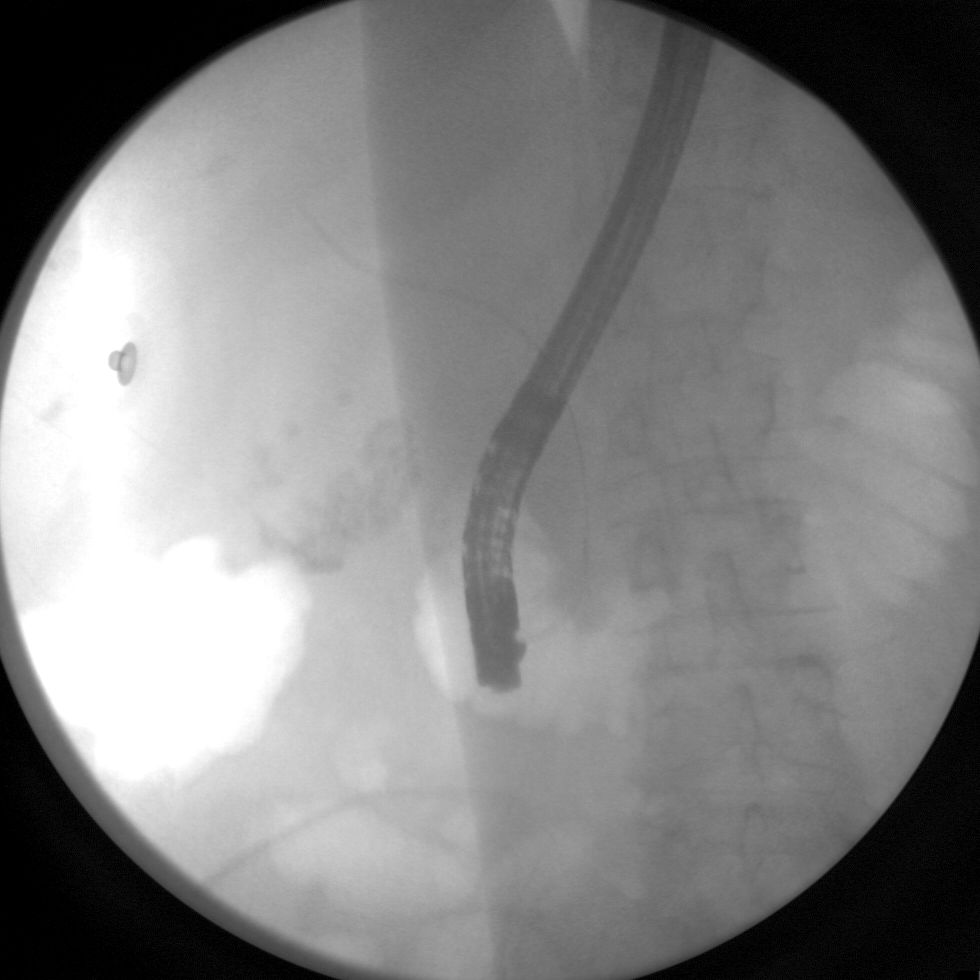
[im 2/5]
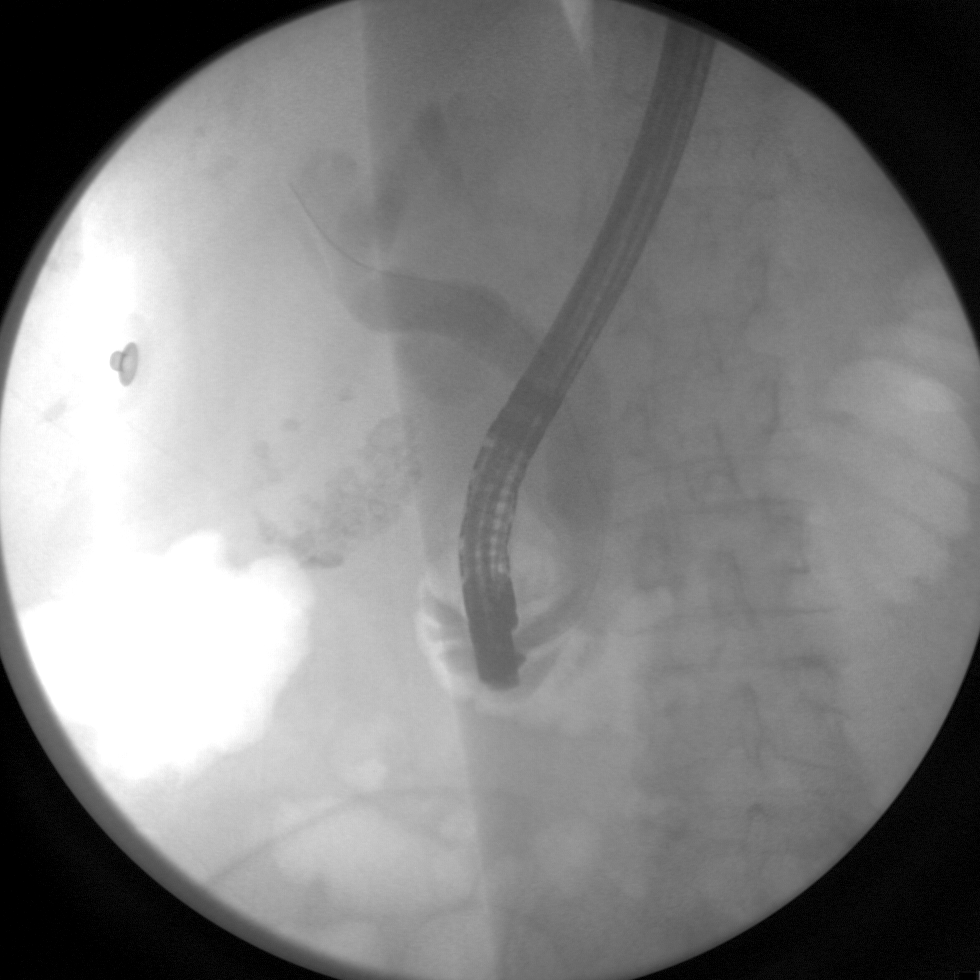
[im 3/5]
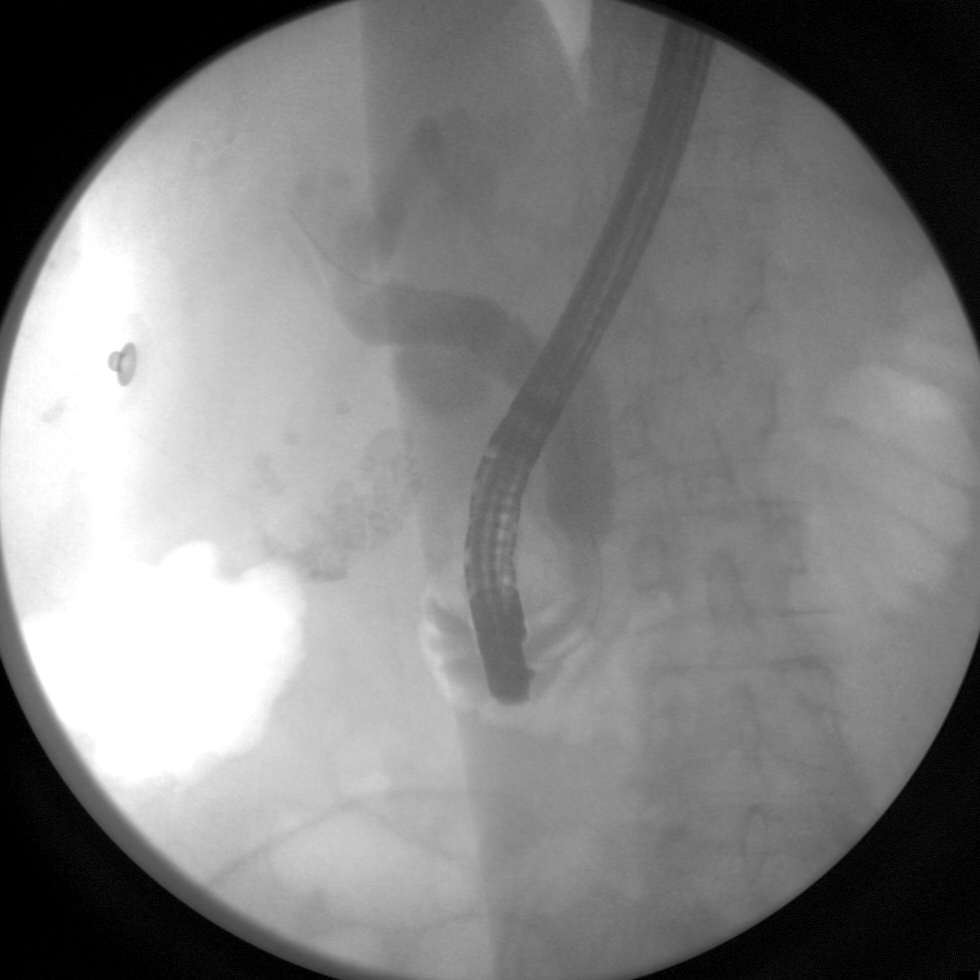
[im 4/5]
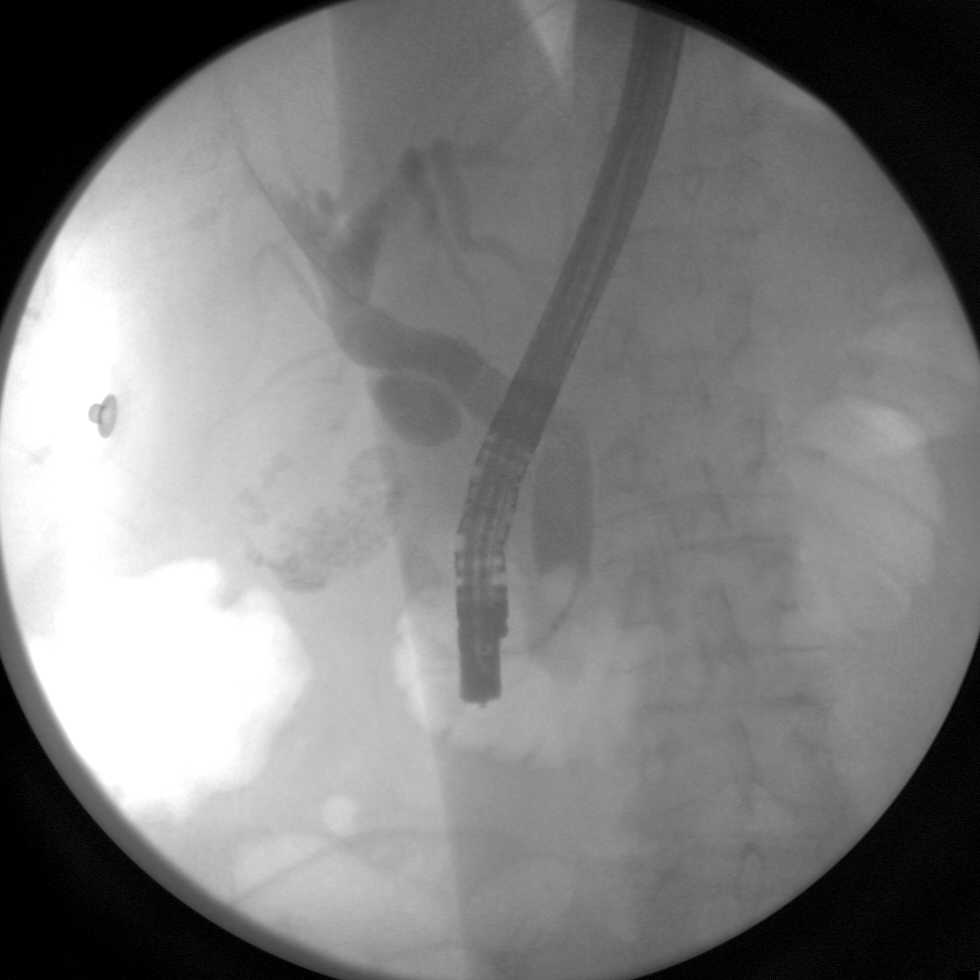
[im 5/5]
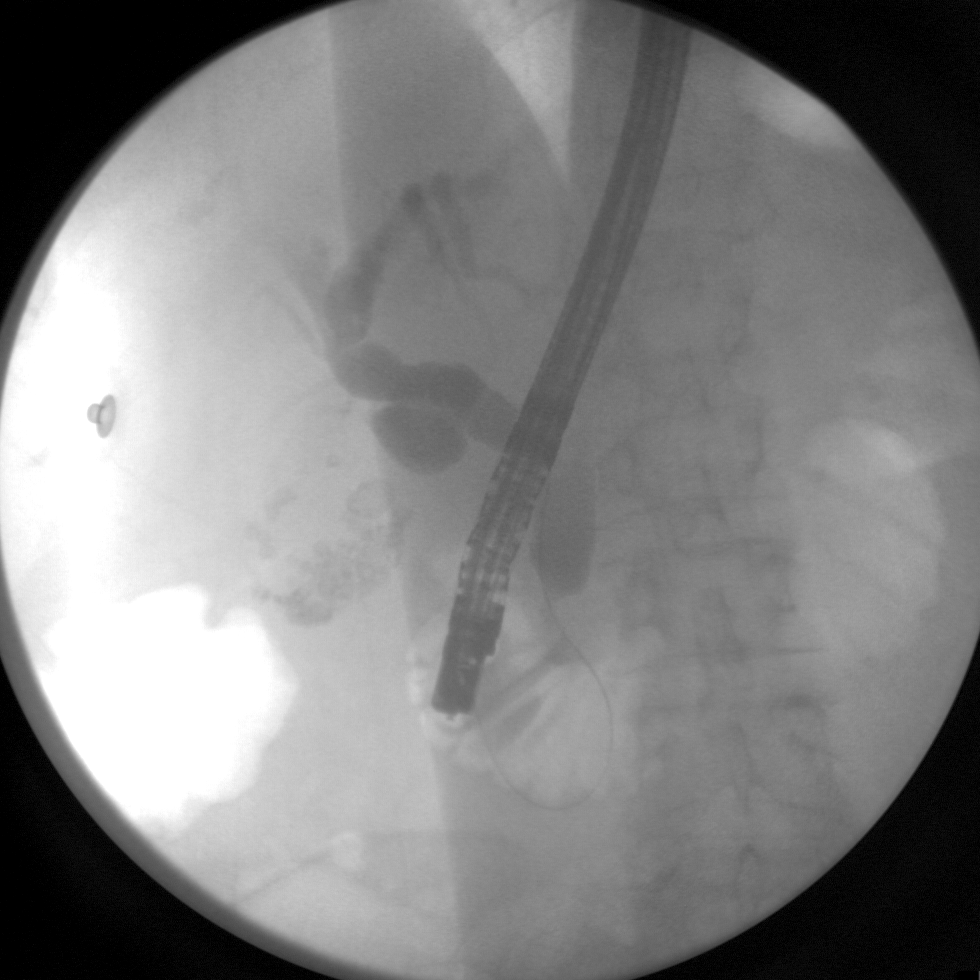

[5 of 5 positions shown; findings below may reference images not displayed]

FINDINGS: The common bile duct is dilated. There is a filling defect in the
lower common bile duct.
IMPRESSION: See above.

These images were submitted for radiologic interpretation only.
Please see the procedural report for the amount of contrast and the
fluoroscopy time utilized.

## 2015-12-06 SURGERY — ERCP, WITH INTERVENTION IF INDICATED
Anesthesia: General

## 2015-12-06 MED ORDER — FENTANYL CITRATE (PF) 100 MCG/2ML IJ SOLN
INTRAMUSCULAR | Status: DC | PRN
  Administered 2015-12-06: 100 ug via INTRAVENOUS

## 2015-12-06 MED ORDER — HYDROMORPHONE HCL 1 MG/ML IJ SOLN: 0.2500 mg | INTRAMUSCULAR | Status: DC | PRN

## 2015-12-06 MED ORDER — SUCCINYLCHOLINE CHLORIDE 20 MG/ML IJ SOLN
INTRAMUSCULAR | Status: DC | PRN
  Administered 2015-12-06: 100 mg via INTRAVENOUS

## 2015-12-06 MED ORDER — PROPOFOL 10 MG/ML IV BOLUS
INTRAVENOUS | Status: DC | PRN
  Administered 2015-12-06: 160 mg via INTRAVENOUS

## 2015-12-06 MED ORDER — SODIUM CHLORIDE 0.9 % IV SOLN
INTRAVENOUS | Status: DC | PRN
  Administered 2015-12-06: 40 mL

## 2015-12-06 MED ORDER — POTASSIUM CHLORIDE 10 MEQ/100ML IV SOLN
10.0000 meq | INTRAVENOUS | Status: AC
  Administered 2015-12-06 (×2): 10 meq via INTRAVENOUS
  Filled 2015-12-06 (×4): qty 100

## 2015-12-06 MED ORDER — MAGNESIUM SULFATE 2 GM/50ML IV SOLN
2.0000 g | Freq: Once | INTRAVENOUS | Status: AC
  Administered 2015-12-06: 2 g via INTRAVENOUS
  Filled 2015-12-06: qty 50

## 2015-12-06 MED ORDER — INDOMETHACIN 50 MG RE SUPP
RECTAL | Status: AC
  Filled 2015-12-06: qty 2

## 2015-12-06 MED ORDER — PHENYLEPHRINE 40 MCG/ML (10ML) SYRINGE FOR IV PUSH (FOR BLOOD PRESSURE SUPPORT)
PREFILLED_SYRINGE | INTRAVENOUS | Status: AC
  Filled 2015-12-06: qty 10

## 2015-12-06 MED ORDER — EPHEDRINE SULFATE 50 MG/ML IJ SOLN
INTRAMUSCULAR | Status: AC
  Filled 2015-12-06: qty 1

## 2015-12-06 MED ORDER — PHENYLEPHRINE HCL 10 MG/ML IJ SOLN
INTRAMUSCULAR | Status: DC | PRN
  Administered 2015-12-06: 80 ug via INTRAVENOUS
  Administered 2015-12-06: 120 ug via INTRAVENOUS

## 2015-12-06 MED ORDER — EPHEDRINE SULFATE 50 MG/ML IJ SOLN
INTRAMUSCULAR | Status: DC | PRN
  Administered 2015-12-06 (×2): 5 mg via INTRAVENOUS
  Administered 2015-12-06: 10 mg via INTRAVENOUS

## 2015-12-06 MED ORDER — FENTANYL CITRATE (PF) 100 MCG/2ML IJ SOLN
INTRAMUSCULAR | Status: AC
  Filled 2015-12-06: qty 2

## 2015-12-06 MED ORDER — LIDOCAINE HCL (CARDIAC) 20 MG/ML IV SOLN
INTRAVENOUS | Status: AC
  Filled 2015-12-06: qty 5

## 2015-12-06 MED ORDER — POTASSIUM CHLORIDE 20 MEQ/15ML (10%) PO SOLN
40.0000 meq | Freq: Once | ORAL | Status: AC
  Administered 2015-12-06: 40 meq via ORAL
  Filled 2015-12-06: qty 30

## 2015-12-06 MED ORDER — PROPOFOL 10 MG/ML IV BOLUS
INTRAVENOUS | Status: AC
  Filled 2015-12-06: qty 20

## 2015-12-06 MED ORDER — SODIUM CHLORIDE 0.9 % IJ SOLN
INTRAMUSCULAR | Status: AC
  Filled 2015-12-06: qty 10

## 2015-12-06 MED ORDER — INDOMETHACIN 50 MG RE SUPP
100.0000 mg | Freq: Once | RECTAL | Status: AC
  Administered 2015-12-06: 100 mg via RECTAL

## 2015-12-06 MED ORDER — ONDANSETRON HCL 4 MG/2ML IJ SOLN: 4.0000 mg | Freq: Once | INTRAMUSCULAR | Status: DC | PRN

## 2015-12-06 MED ORDER — POTASSIUM CHLORIDE 10 MEQ/100ML IV SOLN
10.0000 meq | INTRAVENOUS | Status: AC
  Administered 2015-12-06 (×2): 10 meq via INTRAVENOUS

## 2015-12-06 MED ORDER — LACTATED RINGERS IV SOLN
INTRAVENOUS | Status: DC
  Administered 2015-12-06: 13:00:00 via INTRAVENOUS

## 2015-12-06 MED ORDER — MEPERIDINE HCL 100 MG/ML IJ SOLN: 6.2500 mg | INTRAMUSCULAR | Status: DC | PRN

## 2015-12-06 MED ORDER — ONDANSETRON HCL 4 MG/2ML IJ SOLN
INTRAMUSCULAR | Status: DC | PRN
  Administered 2015-12-06: 4 mg via INTRAVENOUS

## 2015-12-06 MED ORDER — SODIUM CHLORIDE 0.9 % IV SOLN: INTRAVENOUS | Status: DC

## 2015-12-06 MED ORDER — LIDOCAINE HCL (CARDIAC) 20 MG/ML IV SOLN
INTRAVENOUS | Status: DC | PRN
  Administered 2015-12-06: 100 mg via INTRAVENOUS

## 2015-12-06 NOTE — Anesthesia Postprocedure Evaluation (Signed)
Anesthesia Post Note  Patient: [name]  Procedure(s) Performed: Procedure(s) (LRB): ENDOSCOPIC RETROGRADE CHOLANGIOPANCREATOGRAPHY (ERCP) (N/A)  Patient location during evaluation: PACU Anesthesia Type: General Level of consciousness: awake and alert Pain management: pain level controlled Vital Signs Assessment: post-procedure vital signs reviewed and stable Respiratory status: spontaneous breathing, nonlabored ventilation, respiratory function stable and patient connected to nasal cannula oxygen Cardiovascular status: blood pressure returned to baseline and stable Postop Assessment: no signs of nausea or vomiting Anesthetic complications: no    Last Vitals:  Filed Vitals:   12/06/15 1500 12/06/15 1510  BP: 148/76 167/75  Pulse: 96 89  Temp:    Resp: 20 22    Last Pain:  Filed Vitals:   12/06/15 1518  PainSc: 2                  Chaka Boyson DAVID

## 2015-12-06 NOTE — Progress Notes (Signed)
Progress Note   Subjective  [name] is a 65 year old Caucasian female who is admitted to the hospital on 12/05/2015 with gallstone pancreatitis/choledocholithiasis. This morning the patient is found sitting up in the chair by her bed. Her family surrounds her and are somewhat aggravated at the lack of information regarding timing of the patient's procedures. This is discussed with them in great detail and their questions are answered. The patient's procedure is scheduled for 1:30 today with Dr. Ardis Hughs. The patient tells me that she has had change in pain, and denies nausea or vomiting overnight. She is "hungry".   Objective   Vital signs in last 24 hours: Temp:  [97.7 F (36.5 C)-99.1 F (37.3 C)] 98.8 F (37.1 C) (06/13 0607) Pulse Rate:  [55-79] 79 (06/13 0607) Resp:  [14-19] 16 (06/13 0607) BP: (134-188)/(69-88) 134/69 mmHg (06/13 0607) SpO2:  [94 %-98 %] 96 % (06/13 0607) Weight:  [174 lb 11.2 oz (79.243 kg)] 174 lb 11.2 oz (79.243 kg) (06/12 1847) Last BM Date: 12/05/15 General: Caucasian female in NAD Heart:  Regular rate and rhythm; no murmurs Lungs: Respirations even and unlabored, lungs CTA bilaterally Abdomen:  Soft, mild TTP in the right upper quadrant and nondistended. Normal bowel sounds. Extremities:  Without edema. Neurologic:  Alert and oriented,  grossly normal neurologically. Psych:  Cooperative. Normal mood and affect.  Intake/Output from previous day: 06/12 0701 - 06/13 0700 In: 3033.8 [P.O.:240; I.V.:1893.8; IV Piggyback:900] Out: -   Lab Results:  Recent Labs  12/05/15 1136 12/06/15 0426  WBC 8.4 12.0*  HGB 12.1 11.3*  HCT 32.9* 30.5*  PLT 168 156   BMET  Recent Labs  12/05/15 1136 12/06/15 0426  NA 134* 133*  K 3.1* 3.1*  CL 98* 97*  CO2 28 26  GLUCOSE 103* 98  BUN 10 9  CREATININE 0.47 <0.30*  CALCIUM 9.1 8.8*   LFT  Recent Labs  12/06/15 0426  PROT 6.3*  ALBUMIN 3.5  AST 59*  ALT 145*  ALKPHOS 175*  BILITOT 12.4*    PT/INR  Recent Labs  12/05/15 1813  LABPROT 13.4  INR 1.00    Studies/Results: Ct Abdomen Pelvis W Contrast  12/05/2015  CLINICAL DATA:  Upper abdominal pain EXAM: CT ABDOMEN AND PELVIS WITH CONTRAST TECHNIQUE: Multidetector CT imaging of the abdomen and pelvis was performed using the standard protocol following bolus administration of intravenous contrast. CONTRAST:  141mL ISOVUE-300 IOPAMIDOL (ISOVUE-300) INJECTION 61% COMPARISON:  None. FINDINGS: Lower chest: Calcified granuloma identified in the anterior right lung base. Subsegmental atelectasis noted dependently in the lower lobes bilaterally. Hepatobiliary: Heterogeneous perfusion of the liver parenchyma is evident and liver contour is nodular, specially along the inferior right hepatic lobe. There is marked intra and extrahepatic biliary duct dilatation. The common bile duct measures up to 18 mm pancreatic head. Numerous calcified gallstones measure in the 4-7 mm size range. Gallbladder lumen is filled fluid and central material which may be tumefactive sludge. Gallbladder neoplasm considered less likely. 5 mm stone is identified in the region of the common bile duct/ main pancreatic duct confluence at the ampulla (best seen on coronal image 62 of series 5). Pancreas: Diffuse distention of the main pancreatic duct is noted. No discrete pancreatic head mass is evident. Spleen: No splenomegaly. No focal mass lesion. Adrenals/Urinary Tract: No adrenal nodule or mass. 10 mm exophytic lesion in the upper pole left kidney (see image 108 series 5) has attenuation higher than be expected for a simple cyst. Enhancement in this  lesion cannot be excluded. Small low-density lesions in the lower pole of the left kidney are probably cysts. No hydronephrosis. No evidence for hydroureter. No ureteral or bladder stones. Stomach/Bowel: Stomach is nondistended. No gastric wall thickening. No evidence of outlet obstruction. Duodenum is normally positioned as is  the ligament of Treitz. No small bowel wall thickening. No small bowel dilatation. The terminal ileum is normal. Vascular/Lymphatic: There is abdominal aortic atherosclerosis without aneurysm. There is no gastrohepatic or hepatoduodenal ligament lymphadenopathy. No intraperitoneal or retroperitoneal lymphadenopathy. No pelvic sidewall lymphadenopathy. Reproductive: The uterus has normal CT imaging appearance. There is no adnexal mass. Other: No intraperitoneal free fluid. Musculoskeletal: Loss vertebral body height at T12 may be related to inferior endplate compression. IMPRESSION: 1. Marked intra and extrahepatic biliary duct dilatation is associated with diffuse dilatation of the main pancreatic duct. These changes appear to be secondary to a 5 mm stone at the ampulla. No discrete pancreatic head mass is evident on today's study. ERCP may prove helpful to further evaluate. 2. Distended gallbladder with numerous calcified stones in the lumen. Noncalcified debris in the lumen of the gallbladder is probably tumefactive sludge. Gallbladder carcinoma is considered less likely. Follow-up recommended. 3. 10 mm high density lesion in the upper pole the left kidney. Small renal cell carcinoma not excluded. After resolution of acute symptoms, consider MRI without with contrast to further evaluate. 4. Abdominal aortic atherosclerosis. 5. Question mild inferior endplate compression fracture at T12. Electronically Signed   By: Misty Stanley M.D.   On: 12/05/2015 15:11       Assessment / Plan:   Impression: 1. Gallstone pancreatitis: 54mm stone seen in CBD region: Elevated LFTS and Lipase, overnight bilirubin has increased to 12.4 from 9.5. ERCP is planned for 1:30 today. INR 1. 2. Elevated LFTS: See above 3. Elevated lipase: See above 4. H/o Cirrhosis: due to etoh use in the past, denies etoh use since 2003  Plan: 1. Continue Ciprofloxacin 400mg  IV BID 2. ERCP scheduled for 1:30 today with Dr. Ardis Hughs. We discussed  risks and benefits of this procedure with the patient and her family. Answered questions. Reassured the family.  3. Continue supportive measures 4. Please await further recommendations from Dr. Ardis Hughs after time of procedure. 5. Surgery consult has been placed to discuss cholecystectomy in the future. 6. Will also discuss above with Dr. Havery Moros  Thank you for your kind consultation, we will continue to follow  Active Problems:   Gallstone pancreatitis   Acute pancreatitis   Choledocholithiasis   LOS: 1 day   Levin Erp  12/06/2015, 10:10 AM

## 2015-12-06 NOTE — Interval H&P Note (Signed)
History and Physical Interval Note:  12/06/2015 1:37 PM  [name]  has presented today for surgery, with the diagnosis of CBD stone  The various methods of treatment have been discussed with the patient and family. After consideration of risks, benefits and other options for treatment, the patient has consented to  Procedure(s): ENDOSCOPIC RETROGRADE CHOLANGIOPANCREATOGRAPHY (ERCP) (N/A) as a surgical intervention .  The patient's history has been reviewed, patient examined, no change in status, stable for surgery.  I have reviewed the patient's chart and labs.  Questions were answered to the patient's satisfaction.     Milus Banister

## 2015-12-06 NOTE — Progress Notes (Signed)
PROGRESS NOTE    [name]  [id number] DOB: [birth year] DOA: 12/05/2015 PCP: Vance Gather, MD  Outpatient Specialists:   Brief Narrative: 65 year old female with history of hypertension, documented liver cirrhosis and hypothyroidism. Patient initially presented to the ER 3 days ago with abdominal pain, severe nausea and vomiting, abnormal LFT's and severe jaundice but refused to be admitted. Patient continued to have abdominal pain, nausea and vomiting at home and decided to represent today. No fever or chills, no headache, no neck pain, no chest pain, no SOB, no urinary symptoms. CT scan done reveal obstructive picture with gall bladder disease. Bilirubin and other LFT's remain abnormal. Potassium is 3.1.  Assessment & Plan:   Active Problems:   Gallstone pancreatitis   Acute pancreatitis   Choledocholithiasis  Active Problems:  Gallstone pancreatitis  Acute pancreatitis   Assessment/Plan  Acute pancreatitis likely secondary to gallstone with gall bladder disease  Hypertension  Hypokalemia   GI input is appreciated. For possible ERCP today. GI has also consulted the Surgical team.  NPO  Continue IVF.  IV antibiotics  Supportive care  Optimize BP  Monitor electrolytes and replete  DVT prophylaxis: Lovenox Code Status: Full Family Communication:  Disposition Plan: Home  Consults called: GI  Admission status: Inpatient   Consultants:   GI.   Surgery also consulted by the GI team.  Procedures:   Possible ERCP later today  Antimicrobials:   IV cipro and flagyl   Subjective: No new complaints. Pain is better controlled. Labs reviewed. Discussed with GI team (For ERCP later today).  Objective: Filed Vitals:   12/05/15 1739 12/05/15 1847 12/05/15 2159 12/06/15 0607  BP: 168/87 155/83 171/78 134/69  Pulse: 55 61 61 79  Temp: 97.7 F (36.5 C) 99.1 F (37.3 C) 98.1 F (36.7 C) 98.8 F (37.1 C)  TempSrc: Oral Oral Oral Oral  Resp: 16 19  16 16   Height:  5\' 4"  (1.626 m)    Weight:  79.243 kg (174 lb 11.2 oz)    SpO2: 94% 98% 96% 96%    Intake/Output Summary (Last 24 hours) at 12/06/15 1026 Last data filed at 12/06/15 0600  Gross per 24 hour  Intake 3033.75 ml  Output      0 ml  Net 3033.75 ml   Filed Weights   12/05/15 1847  Weight: 79.243 kg (174 lb 11.2 oz)    Examination:  Constitutional:   Jaundiced. Appears calm and comfortable Eyes:   No pallor. Jaundice.  ENMT:   external ears, nose appear normal Neck:   Neck is supple. No JVD Respiratory:   CTA bilaterally, no w/r/r.   Respiratory effort normal. No retractions or accessory muscle use Cardiovascular:   Systolic murmur  No LE extremity edema Abdomen:   Abdomen is tender. Organs are difficult to assess. Neurologic:   Awake and alert.  Moves all limbs.   Data Reviewed: I have personally reviewed following labs and imaging studies  CBC:  Recent Labs Lab 12/02/15 1939 12/05/15 1136 12/06/15 0426  WBC 13.0* 8.4 12.0*  HGB 13.7 12.1 11.3*  HCT 37.0 32.9* 30.5*  MCV 84.9 84.1 83.1  PLT 175 168 [id number]   Basic Metabolic Panel:  Recent Labs Lab 12/02/15 1939 12/05/15 1136 12/05/15 1644 12/06/15 0426  NA 134* 134*  --  133*  K 3.2* 3.1*  --  3.1*  CL 99* 98*  --  97*  CO2 26 28  --  26  GLUCOSE 180* 103*  --  98  BUN 18 10  --  9  CREATININE 0.75 0.47  --  <0.30*  CALCIUM 9.2 9.1  --  8.8*  MG  --   --  1.5*  --   PHOS  --   --  3.7  --    GFR: CrCl cannot be calculated (Patient has no serum creatinine result on file.). Liver Function Tests:  Recent Labs Lab 12/02/15 1939 12/05/15 1136 12/06/15 0426  AST 266* 65* 59*  ALT 415* 178* 145*  ALKPHOS 139* 178* 175*  BILITOT 9.1* 9.5* 12.4*  PROT 7.3 7.1 6.3*  ALBUMIN 4.2 3.9 3.5    Recent Labs Lab 12/02/15 1939 12/05/15 1136 12/06/15 0426  LIPASE 3265* 1498* 1812*   No results for input(s): AMMONIA in the last 168 hours. Coagulation  Profile:  Recent Labs Lab 12/05/15 1813  INR 1.00   Cardiac Enzymes: No results for input(s): CKTOTAL, CKMB, CKMBINDEX, TROPONINI in the last 168 hours. BNP (last 3 results) No results for input(s): PROBNP in the last 8760 hours. HbA1C: No results for input(s): HGBA1C in the last 72 hours. CBG: No results for input(s): GLUCAP in the last 168 hours. Lipid Profile:  Recent Labs  12/06/15 0426  CHOL 163  HDL 25*  LDLCALC 112*  TRIG 131  CHOLHDL 6.5   Thyroid Function Tests:  Recent Labs  12/05/15 1649  TSH 2.795   Anemia Panel: No results for input(s): VITAMINB12, FOLATE, FERRITIN, TIBC, IRON, RETICCTPCT in the last 72 hours. Urine analysis:    Component Value Date/Time   COLORURINE ORANGE* 12/05/2015 1230   APPEARANCEUR CLEAR 12/05/2015 1230   LABSPEC 1.014 12/05/2015 1230   PHURINE 6.5 12/05/2015 1230   GLUCOSEU NEGATIVE 12/05/2015 1230   HGBUR NEGATIVE 12/05/2015 1230   BILIRUBINUR LARGE* 12/05/2015 1230   KETONESUR NEGATIVE 12/05/2015 1230   PROTEINUR NEGATIVE 12/05/2015 1230   NITRITE NEGATIVE 12/05/2015 1230   LEUKOCYTESUR TRACE* 12/05/2015 1230   Sepsis Labs: @LABRCNTIP (procalcitonin:4,lacticidven:4)  )No results found for this or any previous visit (from the past 240 hour(s)).       Radiology Studies: Ct Abdomen Pelvis W Contrast  12/05/2015  CLINICAL DATA:  Upper abdominal pain EXAM: CT ABDOMEN AND PELVIS WITH CONTRAST TECHNIQUE: Multidetector CT imaging of the abdomen and pelvis was performed using the standard protocol following bolus administration of intravenous contrast. CONTRAST:  153mL ISOVUE-300 IOPAMIDOL (ISOVUE-300) INJECTION 61% COMPARISON:  None. FINDINGS: Lower chest: Calcified granuloma identified in the anterior right lung base. Subsegmental atelectasis noted dependently in the lower lobes bilaterally. Hepatobiliary: Heterogeneous perfusion of the liver parenchyma is evident and liver contour is nodular, specially along the inferior  right hepatic lobe. There is marked intra and extrahepatic biliary duct dilatation. The common bile duct measures up to 18 mm pancreatic head. Numerous calcified gallstones measure in the 4-7 mm size range. Gallbladder lumen is filled fluid and central material which may be tumefactive sludge. Gallbladder neoplasm considered less likely. 5 mm stone is identified in the region of the common bile duct/ main pancreatic duct confluence at the ampulla (best seen on coronal image 62 of series 5). Pancreas: Diffuse distention of the main pancreatic duct is noted. No discrete pancreatic head mass is evident. Spleen: No splenomegaly. No focal mass lesion. Adrenals/Urinary Tract: No adrenal nodule or mass. 10 mm exophytic lesion in the upper pole left kidney (see image 108 series 5) has attenuation higher than be expected for a simple cyst. Enhancement in this lesion cannot be excluded. Small low-density lesions in the lower pole  of the left kidney are probably cysts. No hydronephrosis. No evidence for hydroureter. No ureteral or bladder stones. Stomach/Bowel: Stomach is nondistended. No gastric wall thickening. No evidence of outlet obstruction. Duodenum is normally positioned as is the ligament of Treitz. No small bowel wall thickening. No small bowel dilatation. The terminal ileum is normal. Vascular/Lymphatic: There is abdominal aortic atherosclerosis without aneurysm. There is no gastrohepatic or hepatoduodenal ligament lymphadenopathy. No intraperitoneal or retroperitoneal lymphadenopathy. No pelvic sidewall lymphadenopathy. Reproductive: The uterus has normal CT imaging appearance. There is no adnexal mass. Other: No intraperitoneal free fluid. Musculoskeletal: Loss vertebral body height at T12 may be related to inferior endplate compression. IMPRESSION: 1. Marked intra and extrahepatic biliary duct dilatation is associated with diffuse dilatation of the main pancreatic duct. These changes appear to be secondary to a 5  mm stone at the ampulla. No discrete pancreatic head mass is evident on today's study. ERCP may prove helpful to further evaluate. 2. Distended gallbladder with numerous calcified stones in the lumen. Noncalcified debris in the lumen of the gallbladder is probably tumefactive sludge. Gallbladder carcinoma is considered less likely. Follow-up recommended. 3. 10 mm high density lesion in the upper pole the left kidney. Small renal cell carcinoma not excluded. After resolution of acute symptoms, consider MRI without with contrast to further evaluate. 4. Abdominal aortic atherosclerosis. 5. Question mild inferior endplate compression fracture at T12. Electronically Signed   By: Misty Stanley M.D.   On: 12/05/2015 15:11        Scheduled Meds: . ciprofloxacin  400 mg Intravenous Q12H  . citalopram  20 mg Oral Daily  . levothyroxine  125 mcg Oral QAC breakfast  . magnesium sulfate 1 - 4 g bolus IVPB  2 g Intravenous Once  . metronidazole  500 mg Intravenous Q8H  . pantoprazole  20 mg Oral Daily  . potassium chloride  10 mEq Intravenous Q1 Hr x 4  . potassium chloride  40 mEq Oral Once  . potassium chloride  40 mEq Oral Once  . vitamin B-12  100 mcg Oral Daily   Continuous Infusions: . lactated ringers 125 mL/hr at 12/06/15 0553     LOS: 1 day    Time spent: 30 Minutes.    Dana Allan, MD  [facility] Pager #: [phone] 7PM-7AM contact night coverage as above

## 2015-12-06 NOTE — Anesthesia Procedure Notes (Signed)
Procedure Name: Intubation Date/Time: 12/06/2015 2:04 PM Performed by: Danley Danker L Patient Re-evaluated:Patient Re-evaluated prior to inductionOxygen Delivery Method: Circle system utilized Preoxygenation: Pre-oxygenation with 100% oxygen Ventilation: Mask ventilation without difficulty and Oral airway inserted - appropriate to patient size Laryngoscope Size: Sabra Heck and 2 Grade View: Grade II Tube type: Oral Tube size: 7.0 mm Number of attempts: 1 Airway Equipment and Method: Stylet Placement Confirmation: ETT inserted through vocal cords under direct vision,  breath sounds checked- equal and bilateral and positive ETCO2 Secured at: 22 cm Tube secured with: Tape Dental Injury: Teeth and Oropharynx as per pre-operative assessment

## 2015-12-06 NOTE — Consult Note (Signed)
Reason for Consult:Gallstone pancreatitis Referring Physician: Dr. Owens Loffler   [name] is an 65 y.o. female with history of cirrhosis secondary to ETOH abuse. Hypothyroid   HPI: Pt admitted from ED with gallstone pancreatitis and 5 mm stone seen in the CBD on 12/05/15.  She was afebrile in the ED.  She was seen on 12/02/15 with  What she thought was a bleeding ulcer.  Work up shows Lipase of 3265, and bilirubin of 9.1.  WBC 13.0  They tried to admit her but she wanted to go home. Pt was unable to see anyone as an outpatient.  She returned 6/12 with ongoing abdominal pain and hematuria. Work up at that time shows WBC up to 12K, bilirubin up to 12.4 lipase 1812, K+ 3.1.  iNR 1.0.  CT scan showed:  Marked intra and extrahepatic biliary duct dilatation is associated with diffuse dilatation of the main pancreatic duct. These changes appear to be secondary to a 5 mm stone at the ampulla. No discrete pancreatic head mass is evident on today's study. ERCP may prove helpful to further evaluate. Distended gallbladder with numerous calcified stones in the lumen. Noncalcified debris in the lumen of the gallbladder is probably tumefactive sludge. Gallbladder carcinoma is considered less likely. Follow-up recommended. 10 mm high density lesion in the upper pole the left kidney. Small renal cell carcinoma not excluded. After resolution of acute symptoms, consider MRI without with contrast to further evaluate.  She has been seen by GI and taken to the Endo lab for ERCP today. We are ask to see  Past Medical History  Diagnosis Date  . Hypertension   . Thyroid disease     S/p Radiation  . A[place]ORDON 03/16/2010  . Hypothyroidism   . Liver cirrhosis [facility])     Past Surgical History  Procedure Laterality Date  . Im nailing tibia Right 07/19/2012    DR ALUSIO  . Tubal ligation    . Tibia im nail insertion Right 07/19/2013    Procedure: INTRAMEDULLARY (IM) NAIL TIBIAL;  Surgeon: Gearlean Alf, MD;   Location: [facility];  Service: Orthopedics;  Laterality: Right;  . Shoulder hemi-arthroplasty Right 07/22/2013    Procedure: RIGHT SHOULDER HEMI-ARTHROPLASTY;  Surgeon: Augustin Schooling, MD;  Location: [facility];  Service: Orthopedics;  Laterality: Right;    History reviewed. No pertinent family history.  Social History:  reports that she has been smoking Cigarettes.  She has a 40 pack-year smoking history. She has never used smokeless tobacco. She reports that she does not drink alcohol or use illicit drugs.  Allergies:  Allergies  Allergen Reactions  . Oxycodone Hcl Other (See Comments)    REACTION: hallucinations    Prior to Admission medications   Medication Sig Start Date End Date Taking? Authorizing Provider  calcium-vitamin D (OSCAL WITH D) 500-200 MG-UNIT per tablet Take 1 tablet by mouth daily.    Yes Historical Provider, MD  citalopram (CELEXA) 20 MG tablet TAKE 1 TABLET BY MOUTH EVERY DAY. 03/17/14  Yes Patrecia Pour, MD  Cyanocobalamin (VITAMIN B12 PO) Take 1 tablet by mouth daily.   Yes Historical Provider, MD  diphenhydrAMINE (BENADRYL) 25 mg capsule Take 25-50 mg by mouth every 6 (six) hours as needed for sleep.   Yes Historical Provider, MD  Flaxseed, Linseed, (FLAX SEEDS PO) Take 1 tablet by mouth daily.   Yes Historical Provider, MD  folic acid (FOLVITE) 950 MCG tablet Take 400 mcg by mouth daily.   Yes Historical Provider, MD  HYDROcodone-acetaminophen (NORCO/VICODIN) 5-325 MG tablet Take 1 tablet by mouth every 4 (four) hours as needed. Patient taking differently: Take 1 tablet by mouth every 4 (four) hours as needed for moderate pain.  12/02/15  Yes Isla Pence, MD  levothyroxine (SYNTHROID, LEVOTHROID) 125 MCG tablet Take 125 mcg by mouth daily before breakfast.   Yes Historical Provider, MD  lisinopril-hydrochlorothiazide (PRINZIDE,ZESTORETIC) 20-12.5 MG tablet Take 1 tablet by mouth daily.   Yes Historical Provider, MD  Multiple Vitamins-Minerals (MULTIVITAMIN & MINERAL PO)  Take 1 tablet by mouth daily.   Yes Historical Provider, MD  Omega-3 Fatty Acids (FISH OIL) 1000 MG CAPS Take 1 capsule by mouth daily.   Yes Historical Provider, MD  ondansetron (ZOFRAN) 4 MG tablet Take 1 tablet (4 mg total) by mouth every 6 (six) hours. 12/03/15  Yes Isla Pence, MD  pantoprazole (PROTONIX) 20 MG tablet Take 1 tablet (20 mg total) by mouth daily. 12/02/15  Yes Isla Pence, MD  vitamin E 400 UNIT capsule Take 400 Units by mouth daily.   Yes Historical Provider, MD   [phone]  Results for orders placed or performed during the hospital encounter of 12/05/15 (from the past 48 hour(s))  Lipase, blood     Status: Abnormal   Collection Time: 12/05/15 11:36 AM  Result Value Ref Range   Lipase 1498 (H) 11 - 51 U/L    Comment: RESULTS CONFIRMED BY MANUAL DILUTION  Comprehensive metabolic panel     Status: Abnormal   Collection Time: 12/05/15 11:36 AM  Result Value Ref Range   Sodium 134 (L) 135 - 145 mmol/L   Potassium 3.1 (L) 3.5 - 5.1 mmol/L   Chloride 98 (L) 101 - 111 mmol/L   CO2 28 22 - 32 mmol/L   Glucose, Bld 103 (H) 65 - 99 mg/dL   BUN 10 6 - 20 mg/dL   Creatinine, Ser 0.47 0.44 - 1.00 mg/dL   Calcium 9.1 8.9 - 10.3 mg/dL   Total Protein 7.1 6.5 - 8.1 g/dL   Albumin 3.9 3.5 - 5.0 g/dL   AST 65 (H) 15 - 41 U/L   ALT 178 (H) 14 - 54 U/L   Alkaline Phosphatase 178 (H) 38 - 126 U/L   Total Bilirubin 9.5 (H) 0.3 - 1.2 mg/dL   GFR calc non Af Amer >60 >60 mL/min   GFR calc Af Amer >60 >60 mL/min    Comment: (NOTE) The eGFR has been calculated using the CKD EPI equation. This calculation has not been validated in all clinical situations. eGFR's persistently <60 mL/min signify possible Chronic Kidney Disease.    Anion gap 8 5 - 15  CBC     Status: Abnormal   Collection Time: 12/05/15 11:36 AM  Result Value Ref Range   WBC 8.4 4.0 - 10.5 K/uL   RBC 3.91 3.87 - 5.11 MIL/uL   Hemoglobin 12.1 12.0 - 15.0 g/dL   HCT 32.9 (L) 36.0 - 46.0 %   MCV 84.1 78.0 - 100.0  fL   MCH 30.9 26.0 - 34.0 pg   MCHC 36.8 (H) 30.0 - 36.0 g/dL   RDW 13.1 11.5 - 15.5 %   Platelets 168 150 - 400 K/uL  Urinalysis, Routine w reflex microscopic     Status: Abnormal   Collection Time: 12/05/15 12:30 PM  Result Value Ref Range   Color, Urine ORANGE (A) YELLOW    Comment: BIOCHEMICALS MAY BE AFFECTED BY COLOR   APPearance CLEAR CLEAR   Specific Gravity, Urine 1.014 1.005 -  1.030   pH 6.5 5.0 - 8.0   Glucose, UA NEGATIVE NEGATIVE mg/dL   Hgb urine dipstick NEGATIVE NEGATIVE   Bilirubin Urine LARGE (A) NEGATIVE   Ketones, ur NEGATIVE NEGATIVE mg/dL   Protein, ur NEGATIVE NEGATIVE mg/dL   Nitrite NEGATIVE NEGATIVE   Leukocytes, UA TRACE (A) NEGATIVE  Urine microscopic-add on     Status: Abnormal   Collection Time: 12/05/15 12:30 PM  Result Value Ref Range   Squamous Epithelial / LPF 0-5 (A) NONE SEEN   WBC, UA 0-5 0 - 5 WBC/hpf   RBC / HPF 0-5 0 - 5 RBC/hpf   Bacteria, UA RARE (A) NONE SEEN  Magnesium     Status: Abnormal   Collection Time: 12/05/15  4:44 PM  Result Value Ref Range   Magnesium 1.5 (L) 1.7 - 2.4 mg/dL  Phosphorus     Status: None   Collection Time: 12/05/15  4:44 PM  Result Value Ref Range   Phosphorus 3.7 2.5 - 4.6 mg/dL  TSH     Status: None   Collection Time: 12/05/15  4:49 PM  Result Value Ref Range   TSH 2.795 0.350 - 4.500 uIU/mL  Protime-INR Once     Status: None   Collection Time: 12/05/15  6:13 PM  Result Value Ref Range   Prothrombin Time 13.4 11.6 - 15.2 seconds   INR 1.00 0.00 - 1.49  CBC     Status: Abnormal   Collection Time: 12/06/15  4:26 AM  Result Value Ref Range   WBC 12.0 (H) 4.0 - 10.5 K/uL   RBC 3.67 (L) 3.87 - 5.11 MIL/uL   Hemoglobin 11.3 (L) 12.0 - 15.0 g/dL   HCT 89.1 (L) 00.2 - 62.8 %   MCV 83.1 78.0 - 100.0 fL   MCH 30.8 26.0 - 34.0 pg   MCHC 37.0 (H) 30.0 - 36.0 g/dL   RDW 54.9 65.6 - 59.9 %   Platelets 156 150 - 400 K/uL  Comprehensive metabolic panel     Status: Abnormal   Collection Time: 12/06/15   4:26 AM  Result Value Ref Range   Sodium 133 (L) 135 - 145 mmol/L   Potassium 3.1 (L) 3.5 - 5.1 mmol/L   Chloride 97 (L) 101 - 111 mmol/L   CO2 26 22 - 32 mmol/L   Glucose, Bld 98 65 - 99 mg/dL   BUN 9 6 - 20 mg/dL   Creatinine, Ser <4.37 (L) 0.44 - 1.00 mg/dL   Calcium 8.8 (L) 8.9 - 10.3 mg/dL   Total Protein 6.3 (L) 6.5 - 8.1 g/dL   Albumin 3.5 3.5 - 5.0 g/dL   AST 59 (H) 15 - 41 U/L   ALT 145 (H) 14 - 54 U/L   Alkaline Phosphatase 175 (H) 38 - 126 U/L   Total Bilirubin 12.4 (H) 0.3 - 1.2 mg/dL   GFR calc non Af Amer NOT CALCULATED >60 mL/min   GFR calc Af Amer NOT CALCULATED >60 mL/min    Comment: (NOTE) The eGFR has been calculated using the CKD EPI equation. This calculation has not been validated in all clinical situations. eGFR's persistently <60 mL/min signify possible Chronic Kidney Disease.    Anion gap 10 5 - 15  Lipid panel     Status: Abnormal   Collection Time: 12/06/15  4:26 AM  Result Value Ref Range   Cholesterol 163 0 - 200 mg/dL   Triglycerides 190 <707 mg/dL   HDL 25 (L) >21 mg/dL   Total CHOL/HDL  Ratio 6.5 RATIO   VLDL 26 0 - 40 mg/dL   LDL Cholesterol 112 (H) 0 - 99 mg/dL    Comment:        Total Cholesterol/HDL:CHD Risk Coronary Heart Disease Risk Table                     Men   Women  1/2 Average Risk   3.4   3.3  Average Risk       5.0   4.4  2 X Average Risk   9.6   7.1  3 X Average Risk  23.4   11.0        Use the calculated Patient Ratio above and the CHD Risk Table to determine the patient's CHD Risk.        ATP III CLASSIFICATION (LDL):  <100     mg/dL   Optimal  100-129  mg/dL   Near or Above                    Optimal  130-159  mg/dL   Borderline  160-189  mg/dL   High  >190     mg/dL   Very High Performed at [facility]   Lipase, blood     Status: Abnormal   Collection Time: 12/06/15  4:26 AM  Result Value Ref Range   Lipase 1812 (H) 11 - 51 U/L    Comment: RESULTS CONFIRMED BY MANUAL DILUTION    Ct Abdomen Pelvis  W Contrast  12/05/2015  CLINICAL DATA:  Upper abdominal pain EXAM: CT ABDOMEN AND PELVIS WITH CONTRAST TECHNIQUE: Multidetector CT imaging of the abdomen and pelvis was performed using the standard protocol following bolus administration of intravenous contrast. CONTRAST:  19m ISOVUE-300 IOPAMIDOL (ISOVUE-300) INJECTION 61% COMPARISON:  None. FINDINGS: Lower chest: Calcified granuloma identified in the anterior right lung base. Subsegmental atelectasis noted dependently in the lower lobes bilaterally. Hepatobiliary: Heterogeneous perfusion of the liver parenchyma is evident and liver contour is nodular, specially along the inferior right hepatic lobe. There is marked intra and extrahepatic biliary duct dilatation. The common bile duct measures up to 18 mm pancreatic head. Numerous calcified gallstones measure in the 4-7 mm size range. Gallbladder lumen is filled fluid and central material which may be tumefactive sludge. Gallbladder neoplasm considered less likely. 5 mm stone is identified in the region of the common bile duct/ main pancreatic duct confluence at the ampulla (best seen on coronal image 62 of series 5). Pancreas: Diffuse distention of the main pancreatic duct is noted. No discrete pancreatic head mass is evident. Spleen: No splenomegaly. No focal mass lesion. Adrenals/Urinary Tract: No adrenal nodule or mass. 10 mm exophytic lesion in the upper pole left kidney (see image 108 series 5) has attenuation higher than be expected for a simple cyst. Enhancement in this lesion cannot be excluded. Small low-density lesions in the lower pole of the left kidney are probably cysts. No hydronephrosis. No evidence for hydroureter. No ureteral or bladder stones. Stomach/Bowel: Stomach is nondistended. No gastric wall thickening. No evidence of outlet obstruction. Duodenum is normally positioned as is the ligament of Treitz. No small bowel wall thickening. No small bowel dilatation. The terminal ileum is normal.  Vascular/Lymphatic: There is abdominal aortic atherosclerosis without aneurysm. There is no gastrohepatic or hepatoduodenal ligament lymphadenopathy. No intraperitoneal or retroperitoneal lymphadenopathy. No pelvic sidewall lymphadenopathy. Reproductive: The uterus has normal CT imaging appearance. There is no adnexal mass. Other: No intraperitoneal free fluid. Musculoskeletal: Loss  vertebral body height at T12 may be related to inferior endplate compression. IMPRESSION: 1. Marked intra and extrahepatic biliary duct dilatation is associated with diffuse dilatation of the main pancreatic duct. These changes appear to be secondary to a 5 mm stone at the ampulla. No discrete pancreatic head mass is evident on today's study. ERCP may prove helpful to further evaluate. 2. Distended gallbladder with numerous calcified stones in the lumen. Noncalcified debris in the lumen of the gallbladder is probably tumefactive sludge. Gallbladder carcinoma is considered less likely. Follow-up recommended. 3. 10 mm high density lesion in the upper pole the left kidney. Small renal cell carcinoma not excluded. After resolution of acute symptoms, consider MRI without with contrast to further evaluate. 4. Abdominal aortic atherosclerosis. 5. Question mild inferior endplate compression fracture at T12. Electronically Signed   By: Misty Stanley M.D.   On: 12/05/2015 15:11   Dg Ercp Biliary & Pancreatic Ducts  12/06/2015  CLINICAL DATA:  Gallstone will EXAM: ERCP TECHNIQUE: Multiple spot images obtained with the fluoroscopic device and submitted for interpretation post-procedure. FLUOROSCOPY TIME:  Radiation Exposure Index (as provided by the fluoroscopic device): If the device does not provide the exposure index: Fluoroscopy Time:  3 minutes and 48 seconds Number of Acquired Images:  5 COMPARISON:  None. FINDINGS: The common bile duct is dilated. There is a filling defect in the lower common bile duct. IMPRESSION: See above. These images  were submitted for radiologic interpretation only. Please see the procedural report for the amount of contrast and the fluoroscopy time utilized. Electronically Signed   By: Marybelle Killings M.D.   On: 12/06/2015 16:45    Review of Systems  Constitutional: Positive for chills. Negative for fever and weight loss.  HENT: Negative.   Eyes: Negative.   Respiratory: Negative.   Cardiovascular: Negative.   Gastrointestinal: Positive for nausea and abdominal pain. Negative for heartburn, vomiting, diarrhea, constipation, blood in stool and melena.  Genitourinary: Negative.   Skin: Negative.   Neurological: Negative.   Endo/Heme/Allergies: Negative.   Psychiatric/Behavioral: Negative.    Blood pressure 159/75, pulse 75, temperature 98.2 F (36.8 C), temperature source Oral, resp. rate 19, height _0  (1.626 m), weight 78.926 kg (174 lb), SpO2 98 %. Physical Exam  Constitutional: She is oriented to person, place, and time. She appears well-developed and well-nourished. No distress.  HENT:  Head: Normocephalic and atraumatic.  Nose: Nose normal.  Eyes: Right eye exhibits no discharge. Left eye exhibits no discharge. No scleral icterus.  Neck: No JVD present. No tracheal deviation present. No thyromegaly present.  Cardiovascular: Normal rate, regular rhythm and normal heart sounds.   No murmur heard. Respiratory: Effort normal and breath sounds normal. No respiratory distress. She has no wheezes. She has no rales. She exhibits no tenderness.  GI: Soft. Bowel sounds are normal. She exhibits distension. She exhibits no mass. There is no tenderness. There is no rebound and no guarding.  Musculoskeletal: She exhibits no edema.  Lymphadenopathy:    She has no cervical adenopathy.  Neurological: She is alert and oriented to person, place, and time.  Skin: Skin is warm and dry. No rash noted. She is not diaphoretic. No erythema. No pallor.  Psychiatric: She has a normal mood and affect. Her behavior is  normal. Judgment and thought content normal.    Assessment/Plan: Gallstone pancreatitis/choleangitis S/p ERCP/spinchterotomy/papillotomy 12/06/15 Cirrhosis secondary to ETOH use Possible left renal carcinoma Tobacco use -ongoing Hypertension Hypothyroid  Plan:  i will make her NPO and recheck  labs in AM.  Review with Dr. Lucia Gaskins.  If everything checks out may be able to do in the AM.    JENNINGS,WILLARD 12/06/2015, 4:58 PM   Agree with above.  ERCP today by Dr. Rande Brunt.  I discussed with the patient the indications and risks of gall bladder surgery.  The primary risks of gall bladder surgery include, but are not limited to, bleeding, infection, common bile duct injury, and open surgery.  There is also the risk that the patient may have continued symptoms after surgery.  We discussed the typical post-operative recovery course. I tried to answer the patient's questions.   Lipase 1812 today.  One concern is her "cirrhosis" as this can significantly increase the risk of gall bladder surgery. She said that she has known cirrhosis for 14 years.   Will see what this is tomorrow to make surgical decision.  She also has a questionable left renal lesion.  I gave her a copy of her CT scan.  The patient understands the plan.  Her daughter, [name], from [place], was in the room with her.  She lives with another daughter.  Alphonsa Overall, MD, [facility] Surgery Pager: [phone] Office phone:  [phone]

## 2015-12-06 NOTE — Anesthesia Preprocedure Evaluation (Signed)
Anesthesia Evaluation  Patient identified by MRN, date of birth, ID band Patient awake    Reviewed: Allergy & Precautions, NPO status   Airway Mallampati: I  TM Distance: >3 FB Neck ROM: Full    Dental   Pulmonary Current Smoker,    Pulmonary exam normal        Cardiovascular hypertension, Pt. on medications Normal cardiovascular exam     Neuro/Psych    GI/Hepatic   Endo/Other  Hypothyroidism   Renal/GU      Musculoskeletal   Abdominal   Peds  Hematology   Anesthesia Other Findings   Reproductive/Obstetrics                             Anesthesia Physical Anesthesia Plan  ASA: III  Anesthesia Plan: General   Post-op Pain Management:    Induction: Intravenous  Airway Management Planned: Oral ETT  Additional Equipment:   Intra-op Plan:   Post-operative Plan: Extubation in OR  Informed Consent: I have reviewed the patients History and Physical, chart, labs and discussed the procedure including the risks, benefits and alternatives for the proposed anesthesia with the patient or authorized representative who has indicated his/her understanding and acceptance.     Plan Discussed with: CRNA and Surgeon  Anesthesia Plan Comments:         Anesthesia Quick Evaluation

## 2015-12-06 NOTE — Transfer of Care (Signed)
Immediate Anesthesia Transfer of Care Note  Patient: [name]  Procedure(s) Performed: Procedure(s): ENDOSCOPIC RETROGRADE CHOLANGIOPANCREATOGRAPHY (ERCP) (N/A)  Patient Location: Endoscopy Unit  Anesthesia Type:General  Level of Consciousness: awake  Airway & Oxygen Therapy: Patient Spontanous Breathing and Patient connected to face mask oxygen  Post-op Assessment: Report given to RN and Post -op Vital signs reviewed and stable  Post vital signs: Reviewed and stable  Last Vitals:  Filed Vitals:   12/06/15 0607 12/06/15 1242  BP: 134/69 142/65  Pulse: 79 68  Temp: 37.1 C 36.7 C  Resp: 16 18    Last Pain:  Filed Vitals:   12/06/15 1243  PainSc: 2       Patients Stated Pain Goal: 2 ([id number])  Complications: No apparent anesthesia complications

## 2015-12-06 NOTE — H&P (View-Only) (Signed)
Consultation  Referring Provider:   Dr. Marthenia Rolling   Primary Care Physician:  Vance Gather, MD Primary Gastroenterologist:   Dr. Silverio Decamp      Reason for Consultation: Choledocholithiasis              HPI:   [name] is a 65 y.o. Caucasian female with a past medical history significant for hypertension, hypothyroidism and liver cirrhosis who originally presented to the ER on 12/02/2015 for a complaint of black stools, diarrhea and vomiting all through the night on Thursday night, 12/01/15. At that time the patient was found to have elevated liver enzymes as well as an elevated lipase at 3265. It was recommended the patient stay and be admitted, but she decided to leave AMA.  Today, the patient re-presented to the ER. She explains that over the weekend she continued with some right upper quadrant pain which occurred "off and on", and was not made worse or better by anything. She also continued with "dark-looking urine", and nausea which occurred off and on. Patient tells me that since Friday her stools have "lightened up", and become "muddy in form". She tells me she did have some chills over the weekend but no documented fever. The patient explains that she did eat supper last night and had no problems, but does have a decreased appetite. She admits to knowledge of her cirrhosis due to history of alcohol abuse in the past. Her last drink was in 2003. The patient tells me she did call our clinic today and was unable to get in until Wednesday, so she decided to come back to the ER.  Last GI procedures were and 2009 including a colonoscopy and EGD.  ER course:  -CT of the abdomen and pelvis shows marked intra-and extrahepatic biliary ductal dilatation is associated with diffuse dilatation of the main pancreatic duct. These changes appear to be secondary to a 5 mm stone at the ampulla. No discrete pancreatic head mass is evident on today's study. Descending gallbladder with numerous calcified  stones in the lumen. Noncalcified debris in the lumen of the gallbladder probably sludge. Gallbladder carcinoma is considered less likely. 10 mm high-density lesion upper pole of left kidney. Small renal cell carcinoma not excluded. Abdominal aortic atherosclerosis. Questionable mild inferior endplate compression fracture T12. -Labs: Sodium decreased at 134, potassium decreased at 3.1, chloride decreased at 98, lipase elevated at 1498, AST elevated at 65, ALT elevated at 178 and total bilirubin elevated at 9.5. This is compared with a lipase of 3265 on 12/02/2015, AST 266, ALT 415 and total bilirubin 9.1. Currently patient does not have a leukocytosis, it is 8.4 compared with 13.01 12/02/2015. Platelets normal at 168.  Past Medical History  Diagnosis Date  . Hypertension   . Thyroid disease     S/p Radiation  . ACROCHORDON 03/16/2010  . Hypothyroidism   . Liver cirrhosis [facility])     Past Surgical History  Procedure Laterality Date  . Im nailing tibia Right 07/19/2012    DR ALUSIO  . Tubal ligation    . Tibia im nail insertion Right 07/19/2013    Procedure: INTRAMEDULLARY (IM) NAIL TIBIAL;  Surgeon: Gearlean Alf, MD;  Location: [place];  Service: Orthopedics;  Laterality: Right;  . Shoulder hemi-arthroplasty Right 07/22/2013    Procedure: RIGHT SHOULDER HEMI-ARTHROPLASTY;  Surgeon: Augustin Schooling, MD;  Location: [place];  Service: Orthopedics;  Laterality: Right;    Patient admits to family history of colon polyps in her brother;  denies family history of colon cancer, breast cancer ovarian cancer, IBD or, liver or pancreatic disease.  Social History  Substance Use Topics  . Smoking status: Current Every Day Smoker -- 1.00 packs/day for 40 years    Types: Cigarettes  . Smokeless tobacco: Never Used  . Alcohol Use: No     Comment: QUIT DRINKING IN 2002    Prior to Admission medications   Medication Sig Start Date End Date Taking? Authorizing Provider  calcium-vitamin D (OSCAL WITH D)  500-200 MG-UNIT per tablet Take 1 tablet by mouth daily.    Yes Historical Provider, MD  citalopram (CELEXA) 20 MG tablet TAKE 1 TABLET BY MOUTH EVERY DAY. 03/17/14  Yes Patrecia Pour, MD  Cyanocobalamin (VITAMIN B12 PO) Take 1 tablet by mouth daily.   Yes Historical Provider, MD  diphenhydrAMINE (BENADRYL) 25 mg capsule Take 25-50 mg by mouth every 6 (six) hours as needed for sleep.   Yes Historical Provider, MD  Flaxseed, Linseed, (FLAX SEEDS PO) Take 1 tablet by mouth daily.   Yes Historical Provider, MD  folic acid (FOLVITE) A999333 MCG tablet Take 400 mcg by mouth daily.   Yes Historical Provider, MD  HYDROcodone-acetaminophen (NORCO/VICODIN) 5-325 MG tablet Take 1 tablet by mouth every 4 (four) hours as needed. Patient taking differently: Take 1 tablet by mouth every 4 (four) hours as needed for moderate pain.  12/02/15  Yes Isla Pence, MD  levothyroxine (SYNTHROID, LEVOTHROID) 125 MCG tablet Take 125 mcg by mouth daily before breakfast.   Yes Historical Provider, MD  lisinopril-hydrochlorothiazide (PRINZIDE,ZESTORETIC) 20-12.5 MG tablet Take 1 tablet by mouth daily.   Yes Historical Provider, MD  Multiple Vitamins-Minerals (MULTIVITAMIN & MINERAL PO) Take 1 tablet by mouth daily.   Yes Historical Provider, MD  Omega-3 Fatty Acids (FISH OIL) 1000 MG CAPS Take 1 capsule by mouth daily.   Yes Historical Provider, MD  ondansetron (ZOFRAN) 4 MG tablet Take 1 tablet (4 mg total) by mouth every 6 (six) hours. 12/03/15  Yes Isla Pence, MD  pantoprazole (PROTONIX) 20 MG tablet Take 1 tablet (20 mg total) by mouth daily. 12/02/15  Yes Isla Pence, MD  vitamin E 400 UNIT capsule Take 400 Units by mouth daily.   Yes Historical Provider, MD    Current Facility-Administered Medications  Medication Dose Route Frequency Provider Last Rate Last Dose  . citalopram (CELEXA) tablet 20 mg  20 mg Oral Daily Bonnell Public, MD      . enoxaparin (LOVENOX) injection 40 mg  40 mg Subcutaneous Q24H Bonnell Public, MD      . fentaNYL (SUBLIMAZE) injection 25 mcg  25 mcg Intravenous Q1H PRN Leo Grosser, MD   25 mcg at 12/05/15 1552  . [START ON 12/06/2015] levothyroxine (SYNTHROID, LEVOTHROID) tablet 125 mcg  125 mcg Oral QAC breakfast Bonnell Public, MD      . morphine 4 MG/ML injection 4 mg  4 mg Intravenous Q4H PRN Bonnell Public, MD      . pantoprazole (PROTONIX) EC tablet 20 mg  20 mg Oral Daily Bonnell Public, MD      . vitamin B-12 (CYANOCOBALAMIN) tablet 100 mcg  100 mcg Oral Daily Bonnell Public, MD       Current Outpatient Prescriptions  Medication Sig Dispense Refill  . calcium-vitamin D (OSCAL WITH D) 500-200 MG-UNIT per tablet Take 1 tablet by mouth daily.     . citalopram (CELEXA) 20 MG tablet TAKE 1 TABLET BY MOUTH EVERY DAY. [place]bert  tablet 0  . Cyanocobalamin (VITAMIN B12 PO) Take 1 tablet by mouth daily.    . diphenhydrAMINE (BENADRYL) 25 mg capsule Take 25-50 mg by mouth every 6 (six) hours as needed for sleep.    . Flaxseed, Linseed, (FLAX SEEDS PO) Take 1 tablet by mouth daily.    . folic acid (FOLVITE) A999333 MCG tablet Take 400 mcg by mouth daily.    Marland Kitchen HYDROcodone-acetaminophen (NORCO/VICODIN) 5-325 MG tablet Take 1 tablet by mouth every 4 (four) hours as needed. (Patient taking differently: Take 1 tablet by mouth every 4 (four) hours as needed for moderate pain. ) 10 tablet 0  . levothyroxine (SYNTHROID, LEVOTHROID) 125 MCG tablet Take 125 mcg by mouth daily before breakfast.    . lisinopril-hydrochlorothiazide (PRINZIDE,ZESTORETIC) 20-12.5 MG tablet Take 1 tablet by mouth daily.    . Multiple Vitamins-Minerals (MULTIVITAMIN & MINERAL PO) Take 1 tablet by mouth daily.    . Omega-3 Fatty Acids (FISH OIL) 1000 MG CAPS Take 1 capsule by mouth daily.    . ondansetron (ZOFRAN) 4 MG tablet Take 1 tablet (4 mg total) by mouth every 6 (six) hours. 12 tablet 0  . pantoprazole (PROTONIX) 20 MG tablet Take 1 tablet (20 mg total) by mouth daily. 30 tablet 0  . vitamin E 400 UNIT  capsule Take 400 Units by mouth daily.      Allergies as of 12/05/2015 - Review Complete 12/05/2015  Allergen Reaction Noted  . Oxycodone hcl Other (See Comments) 11/19/2006     Review of Systems:    Constitutional: Positive for chills No weight loss, fever, weakness or fatigue HEENT: Eyes: No visual loss, blurred vision, double vision                Ears, Nose, Throat:  No hearing loss, sneezing. Congestion, runny nose or sore throat Skin: No rash or itching Cardiovascular: No chest pain, chest pressure or chest discomfort. No palpitations or edema Respiratory: No SOB, cough or sputum Gastrointestinal: See HPI and otherwise negative Genitourinary: No dysuria or change in urinary frequency Neurological: No headache, dizziness, syncope, numbness or tingling in the extremities Musculoskeletal: No muscle, back pain, joint pain or stiffness. Hematologic: No anemia, bleeding or bruising Lymphatics: No enlarged lymph nodes or history of splenectomy Psychiatric: No history of depression or anxiety Endocrinologic: No reports of sweating, cold or heat intolerance, poyluria or polydypsia Allergies: No history of asthma, hives or eczema    Physical Exam:  Vital signs in last 24 hours: Temp:  [98.1 F (36.7 C)-98.3 F (36.8 C)] 98.1 F (36.7 C) (06/12 1631) Pulse Rate:  [55-77] 60 (06/12 1631) Resp:  [14-16] 16 (06/12 1631) BP: (139-188)/(84-88) 139/85 mmHg (06/12 1631) SpO2:  [96 %-97 %] 96 % (06/12 1631)   General:   Pleasant Caucasian female appears to be in NAD, Well developed, Well nourished, alert and cooperative Head:  Normocephalic and atraumatic. Eyes:   PEERL, EOMI. Icteric. Conjunctiva pink. Ears:  Normal auditory acuity. Neck:  Supple Throat: Oral cavity and pharynx without inflammation, swelling or lesion.  Lungs: Respirations even and unlabored. Lungs clear to auscultation bilaterally.   No wheezes, crackles, or rhonchi.  Heart: Normal S1, S2. No MRG. Regular rate and  rhythm. No peripheral edema, cyanosis or pallor.  Abdomen:  Soft, nondistended, mild tenderness to palpation in the right upper quadrant. No rebound or guarding. Normal bowel sounds. No appreciable masses or hepatomegaly. No abdominal distension.  Rectal:  Not performed.  Msk:  Symmetrical without gross deformities. Peripheral pulses intact.  Extremities:  Without edema, no deformity or joint abnormality. Normal ROM, normal sensation. Neurologic:  Alert and  oriented x4;  grossly normal neurologically. CN II-XII intact.  Skin:   Dry and intact without significant lesions or rashes. Psychiatric: Oriented to person, place and time. Demonstrates good judgement and reason without abnormal affect or behaviors.  LAB RESULTS:  Recent Labs  12/02/15 1939 12/05/15 1136  WBC 13.0* 8.4  HGB 13.7 12.1  HCT 37.0 32.9*  PLT 175 168   BMET  Recent Labs  12/02/15 1939 12/05/15 1136  NA 134* 134*  K 3.2* 3.1*  CL 99* 98*  CO2 26 28  GLUCOSE 180* 103*  BUN 18 10  CREATININE 0.75 0.47  CALCIUM 9.2 9.1   LFT  Recent Labs  12/05/15 1136  PROT 7.1  ALBUMIN 3.9  AST 65*  ALT 178*  ALKPHOS 178*  BILITOT 9.5*   PT/INR No results for input(s): LABPROT, INR in the last 72 hours.  STUDIES: Ct Abdomen Pelvis W Contrast  12/05/2015  CLINICAL DATA:  Upper abdominal pain EXAM: CT ABDOMEN AND PELVIS WITH CONTRAST TECHNIQUE: Multidetector CT imaging of the abdomen and pelvis was performed using the standard protocol following bolus administration of intravenous contrast. CONTRAST:  198mL ISOVUE-300 IOPAMIDOL (ISOVUE-300) INJECTION 61% COMPARISON:  None. FINDINGS: Lower chest: Calcified granuloma identified in the anterior right lung base. Subsegmental atelectasis noted dependently in the lower lobes bilaterally. Hepatobiliary: Heterogeneous perfusion of the liver parenchyma is evident and liver contour is nodular, specially along the inferior right hepatic lobe. There is marked intra and  extrahepatic biliary duct dilatation. The common bile duct measures up to 18 mm pancreatic head. Numerous calcified gallstones measure in the 4-7 mm size range. Gallbladder lumen is filled fluid and central material which may be tumefactive sludge. Gallbladder neoplasm considered less likely. 5 mm stone is identified in the region of the common bile duct/ main pancreatic duct confluence at the ampulla (best seen on coronal image 62 of series 5). Pancreas: Diffuse distention of the main pancreatic duct is noted. No discrete pancreatic head mass is evident. Spleen: No splenomegaly. No focal mass lesion. Adrenals/Urinary Tract: No adrenal nodule or mass. 10 mm exophytic lesion in the upper pole left kidney (see image 108 series 5) has attenuation higher than be expected for a simple cyst. Enhancement in this lesion cannot be excluded. Small low-density lesions in the lower pole of the left kidney are probably cysts. No hydronephrosis. No evidence for hydroureter. No ureteral or bladder stones. Stomach/Bowel: Stomach is nondistended. No gastric wall thickening. No evidence of outlet obstruction. Duodenum is normally positioned as is the ligament of Treitz. No small bowel wall thickening. No small bowel dilatation. The terminal ileum is normal. Vascular/Lymphatic: There is abdominal aortic atherosclerosis without aneurysm. There is no gastrohepatic or hepatoduodenal ligament lymphadenopathy. No intraperitoneal or retroperitoneal lymphadenopathy. No pelvic sidewall lymphadenopathy. Reproductive: The uterus has normal CT imaging appearance. There is no adnexal mass. Other: No intraperitoneal free fluid. Musculoskeletal: Loss vertebral body height at T12 may be related to inferior endplate compression. IMPRESSION: 1. Marked intra and extrahepatic biliary duct dilatation is associated with diffuse dilatation of the main pancreatic duct. These changes appear to be secondary to a 5 mm stone at the ampulla. No discrete  pancreatic head mass is evident on today's study. ERCP may prove helpful to further evaluate. 2. Distended gallbladder with numerous calcified stones in the lumen. Noncalcified debris in the lumen of the gallbladder is probably tumefactive sludge. Gallbladder carcinoma is  considered less likely. Follow-up recommended. 3. 10 mm high density lesion in the upper pole the left kidney. Small renal cell carcinoma not excluded. After resolution of acute symptoms, consider MRI without with contrast to further evaluate. 4. Abdominal aortic atherosclerosis. 5. Question mild inferior endplate compression fracture at T12. Electronically Signed   By: Misty Stanley M.D.   On: 12/05/2015 15:11     PREVIOUS ENDOSCOPIES:            05/11/2008-Dr. Deatra Ina: Normal EGD from proximal esophagus to second portion of the duodenum 08/15/2006-Dr. Deatra Ina: Normal proximal esophagus, erythematous mucosa in the antrum with minimal erythema and edema in the area of a previous ulcer, normal exam from antrum to second portion of the duodenum 06/22/2006 colonoscopy-Dr. Fuller Plan: Normal exam   Impression / Plan:  Impression: 1. Gallstone pancreatitis: 42mm stone seen in CBD region: Elevated LFTS and Lipase 2. Elevated LFTS: See above 3. Elevated lipase: See above 4. H/o Cirrhosis: due to etoh use in the past, denies etoh use since 2003  Plan: 1. Will start Ciprofloxacin 400mg  IV BID 2. Ordered PT/INR 3. Discussed ERCP with the patient today including risks, benefits and limitations. She agrees to proceed. 4. Discussed case with Dr. Wilford Sports try to get pt on schedule tomorrow afternoon, if not able, will need to be done Wed/Thurs 5. NPO after midnight, clears tonight 6. Continue supportive measures 7. Will also discuss above with Dr. Havery Moros  Thank you for your kind consultation, we will continue to follow Taylor Newer, PA-C  Thank you for your kind consultation, we will continue to follow.  [name]   12/05/2015, 4:35 PM

## 2015-12-06 NOTE — Op Note (Signed)
[facility] Patient Name: [name] Procedure Date: 12/06/2015 MRN: [id number] Attending MD: Milus Banister , MD Date of Birth: 1951-01-10 CSN: [id number] Age: 65 Admit Type: Inpatient Procedure:                ERCP Indications:              Bile duct stone on Computed Tomogram Scan, Elevated                            bilirubin Providers:                Milus Banister, MD, Hilma Favors, RN, Zenon Mayo, RN, William Dalton, Technician Referring MD:              Medicines:                General Anesthesia Complications:            No immediate complications. Estimated blood loss:                            None Estimated Blood Loss:     Estimated blood loss: none. Procedure:                Pre-Anesthesia Assessment:                           - Prior to the procedure, a History and Physical                            was performed, and patient medications and                            allergies were reviewed. The patient's tolerance of                            previous anesthesia was also reviewed. The risks                            and benefits of the procedure and the sedation                            options and risks were discussed with the patient.                            All questions were answered, and informed consent                            was obtained. Prior Anticoagulants: The patient has                            taken Lovenox (enoxaparin), last dose was 1 day                            prior to  procedure. ASA Grade Assessment: II - A                            patient with mild systemic disease. After reviewing                            the risks and benefits, the patient was deemed in                            satisfactory condition to undergo the procedure.                           After obtaining informed consent, the scope was                            passed under direct vision. Throughout the                             procedure, the patient's blood pressure, pulse, and                            oxygen saturations were monitored continuously. The                            was introduced through the mouth, and used to                            inject contrast into and used to inject contrast                            into the bile duct. The ERCP was accomplished                            without difficulty. The patient tolerated the                            procedure well. Scope In: Scope Out: Findings:      The scout film was normal except for multiple obvious stones in the       gallbladder. The esophagus was successfully intubated under direct       vision. The scope was advanced to a bulging major papilla in the       descending duodenum without detailed examination of the pharynx, larynx       and associated structures, and upper GI tract. A 44 [employer]ot[employer] over a .035       hydrawire was used to cannulate the bile duct. During wire cannulation,       copious dark bile and multiple very small black stone fragments passed       into the duodenum. The main pancreatic duct was briefly cannulated with       the wire but never injected with dye. Dye was injected into the bile       duct and this showed diffusely dilated extrahepatic bile ducts (CBD       56mm). Filling defects were not noted however given the  CT scan and       obvious drainage of stone debris with wire cannulation I performed       biliary sphincterotomy over of the wire. There was immediate delivery of       more dark, deeply marroon tinged bile and a single small brown stone       (4-26mm across) into the duodenum. There was also frank purulence       draining across the sphincterotomy as well. I performed balloon sweeping       several times. Dark, marroon tinged bile was delivered into the       duodenum. No further stones. A completion, occlusion cholangiogram       showed no clear remaining filling  defects and the scope was withdrawn. Impression:               - Choledocholithiasis was found. This was treated                            with biliary sphincterotomy and balloon sweeping.                            During the case purulent, dark (somewhat blood                            tinged) bile was found. The blood was not bright                            red and did not appear to be coming from the                            sphincterotomy. I felt it was likely due to                            somewhat more remote oozing of the bile duct                            related to cholangitis, irritation from the                            impacted CBD stone. Moderate Sedation:      N/A- Per Anesthesia Care Recommendation:           - Return patient to hospital ward for ongoing care.                            She should complete another 48 hours of IV                            antibiotics and then 5 more days of oral                            antibiotics given the frank purulent cholangitis                            noted during this exam.                           -  Would hold off lovenox, other blood thinners for                            now.                           - Observe clinically, follow LFTS.                           - She will need surgical consult to consider same                            admission cholecystectomy. Procedure Code(s):        --- Professional ---                           [phone], Endoscopic retrograde                            cholangiopancreatography (ERCP); with removal of                            calculi/debris from biliary/pancreatic duct(s)                           43262, Endoscopic retrograde                            cholangiopancreatography (ERCP); with                            sphincterotomy/papillotomy Diagnosis Code(s):        --- Professional ---                           K80.50, Calculus of bile duct without cholangitis                             or cholecystitis without obstruction                           E80.7, Disorder of bilirubin metabolism, unspecified CPT copyright 2016 [employer]. All rights reserved. The codes documented in this report are preliminary and upon coder review may  be revised to meet current compliance requirements. Milus Banister, MD 12/06/2015 3:01:26 PM This report has been signed electronically. Number of Addenda: 0

## 2015-12-07 ENCOUNTER — Ambulatory Visit: Payer: Medicare Other | Admitting: Gastroenterology

## 2015-12-07 ENCOUNTER — Inpatient Hospital Stay (HOSPITAL_COMMUNITY): Payer: Medicare Other

## 2015-12-07 ENCOUNTER — Inpatient Hospital Stay (HOSPITAL_COMMUNITY): Payer: Medicare Other | Admitting: Anesthesiology

## 2015-12-07 ENCOUNTER — Encounter (HOSPITAL_COMMUNITY)
Admission: EM | Disposition: A | Discharge status: Home / Self Care | Payer: Self-pay | Source: Home / Self Care | Attending: Internal Medicine

## 2015-12-07 ENCOUNTER — Encounter (HOSPITAL_COMMUNITY): Payer: Self-pay | Admitting: Anesthesiology

## 2015-12-07 DIAGNOSIS — K851 Biliary acute pancreatitis without necrosis or infection: Secondary | ICD-10-CM | POA: Insufficient documentation

## 2015-12-07 HISTORY — PX: CHOLECYSTECTOMY: SHX55

## 2015-12-07 LAB — CBC
HCT: 26.9 % — ABNORMAL LOW (ref 36.0–46.0)
HEMOGLOBIN: 9.9 g/dL — AB (ref 12.0–15.0)
MCH: 30.6 pg (ref 26.0–34.0)
MCHC: 36.8 g/dL — AB (ref 30.0–36.0)
MCV: 83 fL (ref 78.0–100.0)
Platelets: 133 10*3/uL — ABNORMAL LOW (ref 150–400)
RBC: 3.24 MIL/uL — AB (ref 3.87–5.11)
RDW: 13.5 % (ref 11.5–15.5)
WBC: 8.7 10*3/uL (ref 4.0–10.5)

## 2015-12-07 LAB — LIPASE, BLOOD: LIPASE: 43 U/L (ref 11–51)

## 2015-12-07 LAB — SURGICAL PCR SCREEN
MRSA, PCR: NEGATIVE
Staphylococcus aureus: NEGATIVE

## 2015-12-07 LAB — COMPREHENSIVE METABOLIC PANEL
ALT: 107 U/L — ABNORMAL HIGH (ref 14–54)
ANION GAP: 6 (ref 5–15)
AST: 41 U/L (ref 15–41)
Albumin: 3.1 g/dL — ABNORMAL LOW (ref 3.5–5.0)
Alkaline Phosphatase: 165 U/L — ABNORMAL HIGH (ref 38–126)
BILIRUBIN TOTAL: 9.6 mg/dL — AB (ref 0.3–1.2)
BUN: 11 mg/dL (ref 6–20)
CO2: 27 mmol/L (ref 22–32)
Calcium: 8.5 mg/dL — ABNORMAL LOW (ref 8.9–10.3)
Chloride: 104 mmol/L (ref 101–111)
Creatinine, Ser: 0.49 mg/dL (ref 0.44–1.00)
Glucose, Bld: 99 mg/dL (ref 65–99)
POTASSIUM: 3.8 mmol/L (ref 3.5–5.1)
Sodium: 137 mmol/L (ref 135–145)
TOTAL PROTEIN: 6 g/dL — AB (ref 6.5–8.1)

## 2015-12-07 LAB — APTT: APTT: 40 s — AB (ref 24–37)

## 2015-12-07 IMAGING — RF DG CHOLANGIOGRAM OPERATIVE
1 series · 4 of 4 positions shown · non-contrast
Comparison: Endoscopy [DATE]

CLINICAL DATA: Cholelithiasis

EXAM:
INTRAOPERATIVE CHOLANGIOGRAM
TECHNIQUE: Cholangiographic images from the C-arm fluoroscopic device were
submitted for interpretation post-operatively. Please see the
procedural report for the amount of contrast and the fluoroscopy
time utilized.

[Series 1: run · 4 of 73 frames shown]
[frame 5/73]
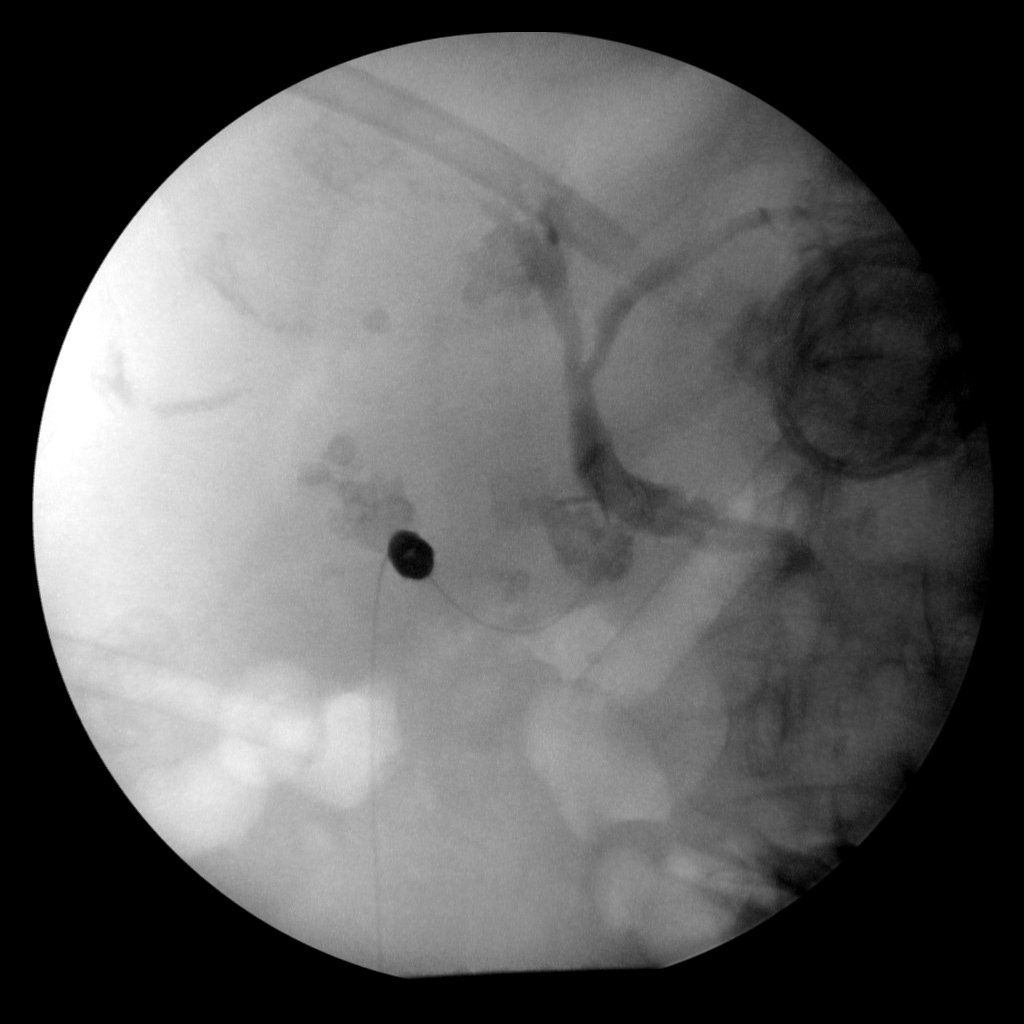
[frame 11/73]
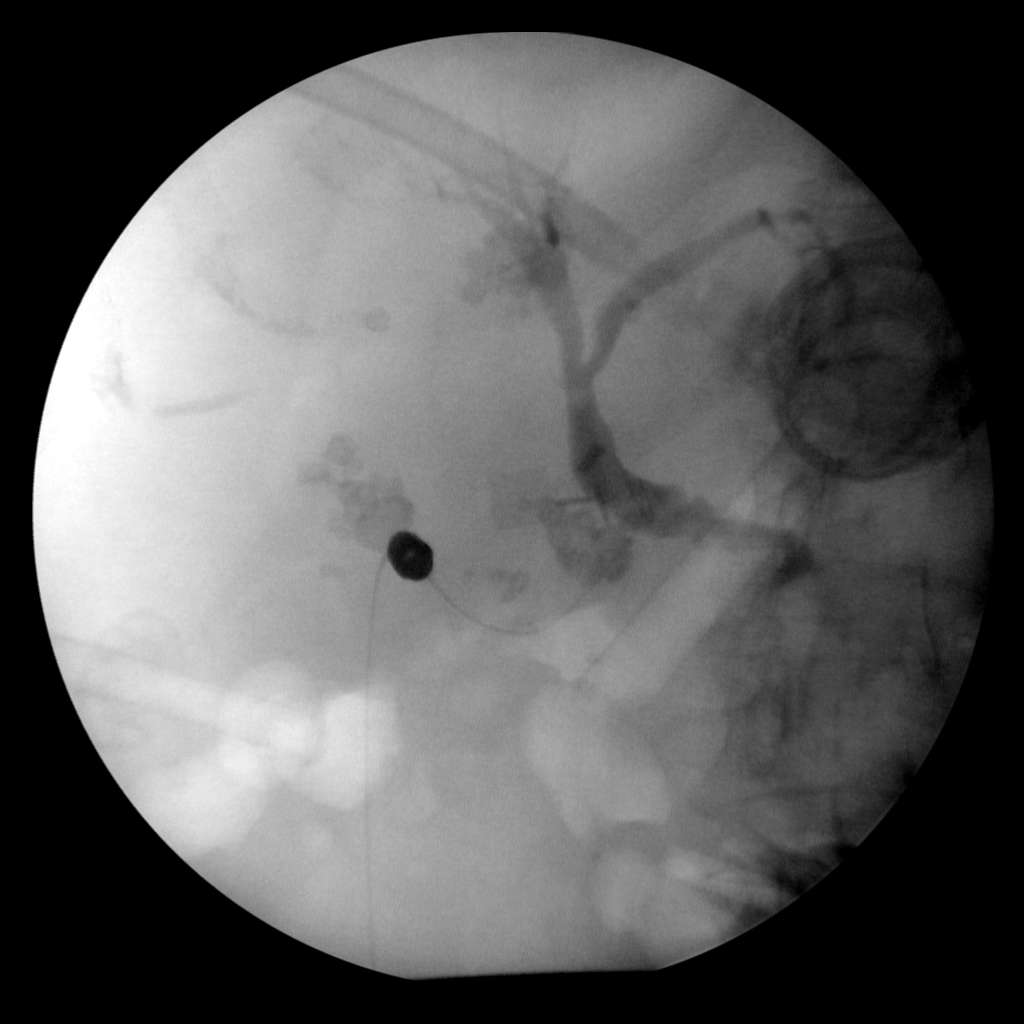
[frame 37/73]
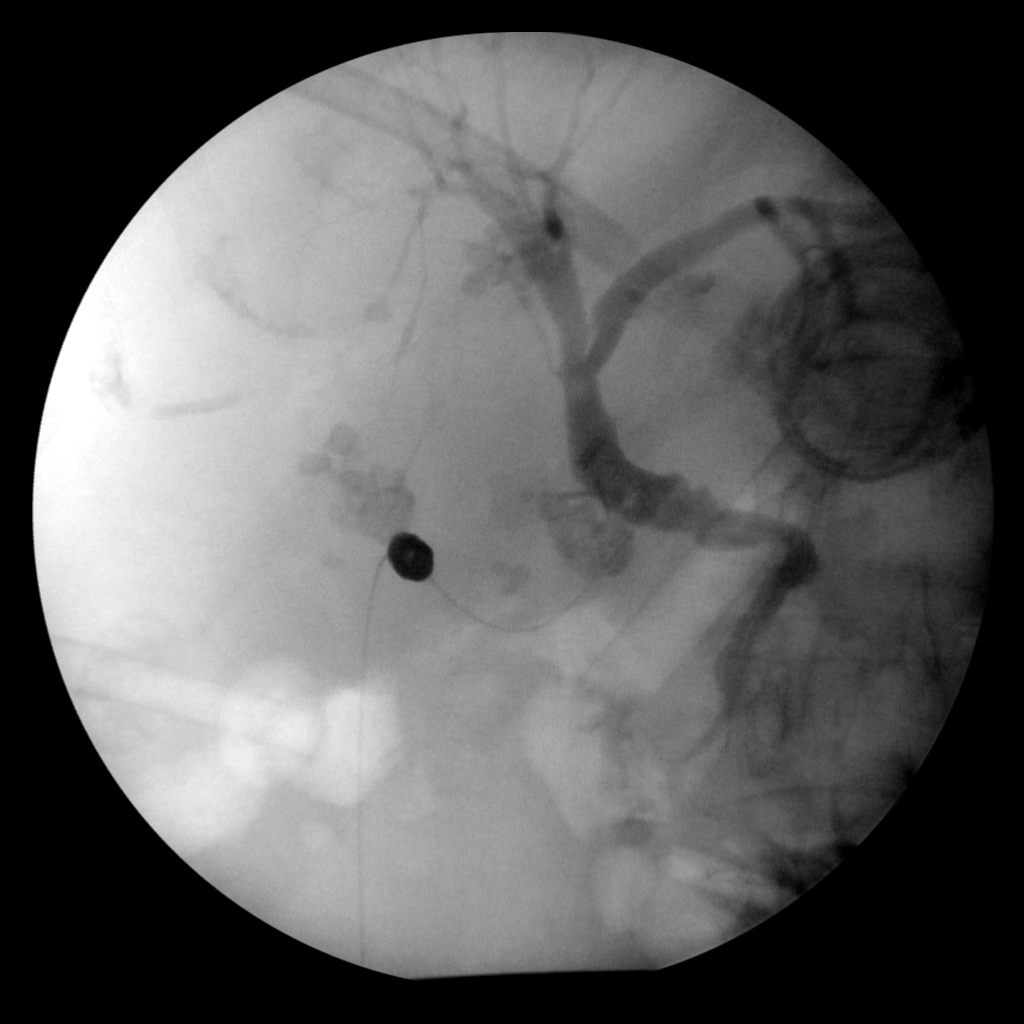
[frame 63/73]
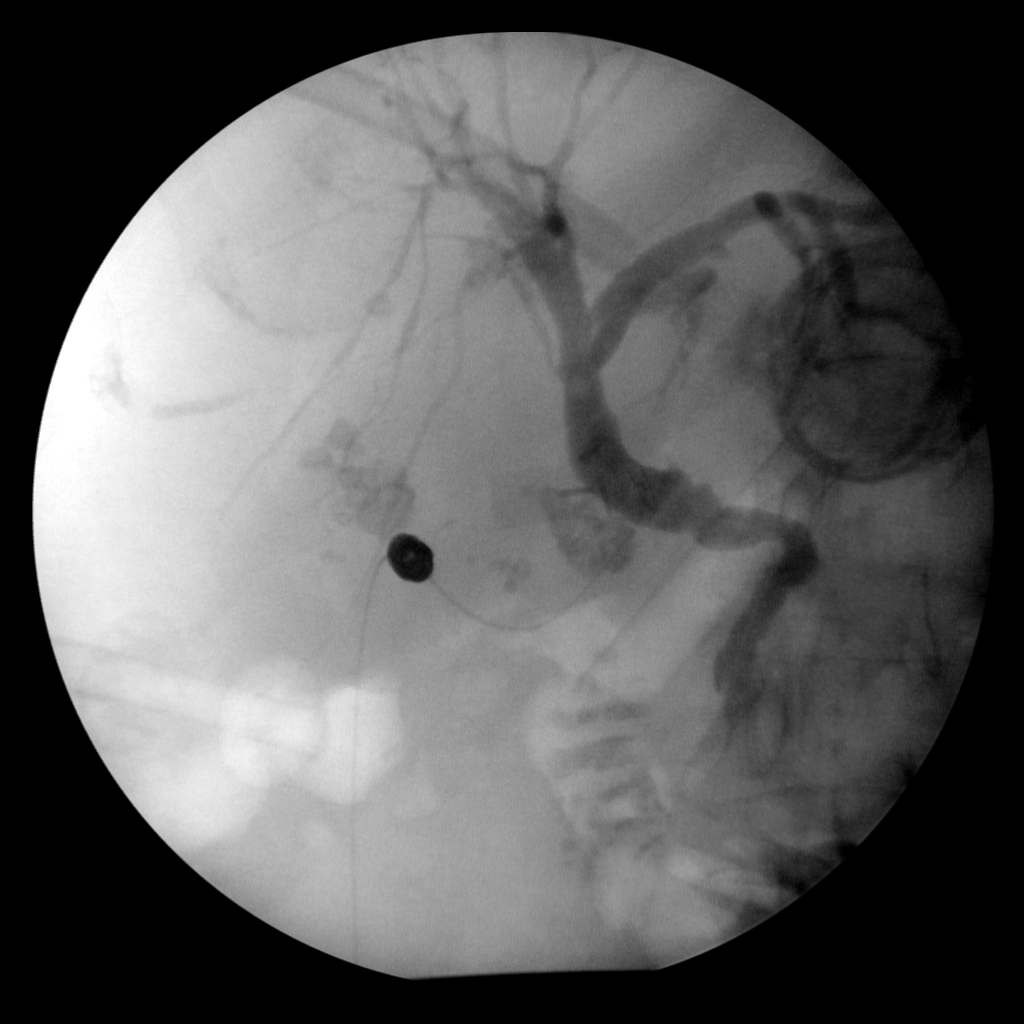

[4 of 4 positions shown; findings below may reference images not displayed]

FINDINGS: No persistent filling defects in the common duct. Intrahepatic ducts
are incompletely visualized, appearing decompressed centrally.
Contrast passes into the duodenum.

:
Negative for retained common duct stone.

## 2015-12-07 SURGERY — LAPAROSCOPIC CHOLECYSTECTOMY WITH INTRAOPERATIVE CHOLANGIOGRAM
Anesthesia: General

## 2015-12-07 MED ORDER — LIDOCAINE HCL (CARDIAC) 20 MG/ML IV SOLN
INTRAVENOUS | Status: DC | PRN
  Administered 2015-12-07: 40 mg via INTRAVENOUS

## 2015-12-07 MED ORDER — LACTATED RINGERS IV SOLN
INTRAVENOUS | Status: DC | PRN
  Administered 2015-12-07: 5000 mL via INTRAVENOUS

## 2015-12-07 MED ORDER — BUPIVACAINE-EPINEPHRINE 0.25% -1:200000 IJ SOLN
INTRAMUSCULAR | Status: AC
  Filled 2015-12-07: qty 1

## 2015-12-07 MED ORDER — DEXAMETHASONE SODIUM PHOSPHATE 10 MG/ML IJ SOLN
INTRAMUSCULAR | Status: AC
  Filled 2015-12-07: qty 1

## 2015-12-07 MED ORDER — MORPHINE SULFATE (PF) 2 MG/ML IV SOLN: 1.0000 mg | INTRAVENOUS | Status: DC | PRN

## 2015-12-07 MED ORDER — SUGAMMADEX SODIUM 200 MG/2ML IV SOLN
INTRAVENOUS | Status: DC | PRN
  Administered 2015-12-07: 200 mg via INTRAVENOUS

## 2015-12-07 MED ORDER — SUGAMMADEX SODIUM 200 MG/2ML IV SOLN
INTRAVENOUS | Status: AC
  Filled 2015-12-07: qty 2

## 2015-12-07 MED ORDER — PROPOFOL 10 MG/ML IV BOLUS
INTRAVENOUS | Status: AC
  Filled 2015-12-07: qty 20

## 2015-12-07 MED ORDER — BUPIVACAINE HCL (PF) 0.25 % IJ SOLN
INTRAMUSCULAR | Status: DC | PRN
Start: 2015-12-07 — End: 2015-12-07
  Administered 2015-12-07: 50 mL

## 2015-12-07 MED ORDER — ROCURONIUM BROMIDE 100 MG/10ML IV SOLN
INTRAVENOUS | Status: AC
  Filled 2015-12-07: qty 1

## 2015-12-07 MED ORDER — ONDANSETRON HCL 4 MG/2ML IJ SOLN
INTRAMUSCULAR | Status: AC
  Filled 2015-12-07: qty 2

## 2015-12-07 MED ORDER — LISINOPRIL 20 MG PO TABS
20.0000 mg | ORAL_TABLET | Freq: Every day | ORAL | Status: DC
  Administered 2015-12-08 – 2015-12-09 (×2): 20 mg via ORAL
  Filled 2015-12-07 (×2): qty 1

## 2015-12-07 MED ORDER — HYDROCODONE-ACETAMINOPHEN 5-325 MG PO TABS
1.0000 | ORAL_TABLET | ORAL | Status: DC | PRN
  Administered 2015-12-08 (×2): 2 via ORAL
  Administered 2015-12-09: 1 via ORAL
  Filled 2015-12-07: qty 2
  Filled 2015-12-07: qty 1
  Filled 2015-12-07: qty 2

## 2015-12-07 MED ORDER — EPHEDRINE SULFATE 50 MG/ML IJ SOLN
INTRAMUSCULAR | Status: DC | PRN
  Administered 2015-12-07: 5 mg via INTRAVENOUS

## 2015-12-07 MED ORDER — FENTANYL CITRATE (PF) 100 MCG/2ML IJ SOLN
INTRAMUSCULAR | Status: DC | PRN
Start: 2015-12-07 — End: 2015-12-07
  Administered 2015-12-07 (×2): 100 ug via INTRAVENOUS

## 2015-12-07 MED ORDER — PROPOFOL 10 MG/ML IV BOLUS
INTRAVENOUS | Status: DC | PRN
  Administered 2015-12-07: 120 mg via INTRAVENOUS

## 2015-12-07 MED ORDER — CIPROFLOXACIN IN D5W 400 MG/200ML IV SOLN
INTRAVENOUS | Status: AC
  Filled 2015-12-07: qty 200

## 2015-12-07 MED ORDER — CARVEDILOL 3.125 MG PO TABS: 3.1250 mg | ORAL_TABLET | Freq: Two times a day (BID) | ORAL | Status: DC

## 2015-12-07 MED ORDER — IOPAMIDOL (ISOVUE-300) INJECTION 61%
INTRAVENOUS | Status: AC
  Filled 2015-12-07: qty 50

## 2015-12-07 MED ORDER — ROCURONIUM BROMIDE 100 MG/10ML IV SOLN
INTRAVENOUS | Status: DC | PRN
  Administered 2015-12-07: 50 mg via INTRAVENOUS
  Administered 2015-12-07 (×2): 10 mg via INTRAVENOUS

## 2015-12-07 MED ORDER — HYDROMORPHONE HCL 1 MG/ML IJ SOLN
0.2500 mg | INTRAMUSCULAR | Status: DC | PRN
  Administered 2015-12-07: 0.5 mg via INTRAVENOUS
  Administered 2015-12-07 (×2): 0.25 mg via INTRAVENOUS

## 2015-12-07 MED ORDER — ONDANSETRON HCL 4 MG/2ML IJ SOLN
INTRAMUSCULAR | Status: DC | PRN
  Administered 2015-12-07: 4 mg via INTRAVENOUS

## 2015-12-07 MED ORDER — SODIUM CHLORIDE 0.9 % IV SOLN
INTRAVENOUS | Status: DC | PRN
  Administered 2015-12-07: 18:00:00

## 2015-12-07 MED ORDER — KETOROLAC TROMETHAMINE 30 MG/ML IJ SOLN: 30.0000 mg | Freq: Once | INTRAMUSCULAR | Status: DC | PRN

## 2015-12-07 MED ORDER — DEXAMETHASONE SODIUM PHOSPHATE 10 MG/ML IJ SOLN
INTRAMUSCULAR | Status: DC | PRN
  Administered 2015-12-07: 10 mg via INTRAVENOUS

## 2015-12-07 MED ORDER — FENTANYL CITRATE (PF) 250 MCG/5ML IJ SOLN
INTRAMUSCULAR | Status: AC
  Filled 2015-12-07: qty 5

## 2015-12-07 MED ORDER — HYDROMORPHONE HCL 1 MG/ML IJ SOLN
INTRAMUSCULAR | Status: AC
  Filled 2015-12-07: qty 1

## 2015-12-07 MED ORDER — PROMETHAZINE HCL 25 MG/ML IJ SOLN: 6.2500 mg | INTRAMUSCULAR | Status: DC | PRN

## 2015-12-07 MED ORDER — LIDOCAINE HCL (CARDIAC) 20 MG/ML IV SOLN
INTRAVENOUS | Status: AC
  Filled 2015-12-07: qty 5

## 2015-12-07 SURGICAL SUPPLY — 50 items
APL SKNCLS STERI-STRIP NONHPOA (GAUZE/BANDAGES/DRESSINGS) ×1
APPLIER CLIP 5 13 M/L LIGAMAX5 (MISCELLANEOUS)
APPLIER CLIP ROT 10 11.4 M/L (STAPLE) ×2
APR CLP MED LRG 11.4X10 (STAPLE) ×1
APR CLP MED LRG 5 ANG JAW (MISCELLANEOUS)
BAG SPEC RTRVL 10 TROC 200 (ENDOMECHANICALS) ×1
BAG SPEC RTRVL LRG 6X4 10 (ENDOMECHANICALS) ×1
BENZOIN TINCTURE PRP APPL 2/3 (GAUZE/BANDAGES/DRESSINGS) ×2 IMPLANT
CABLE HIGH FREQUENCY MONO STRZ (ELECTRODE) ×2 IMPLANT
CHLORAPREP W/TINT 26ML (MISCELLANEOUS) ×2 IMPLANT
CHOLANGIOGRAM CATH TAUT (CATHETERS) ×2 IMPLANT
CLIP APPLIE 5 13 M/L LIGAMAX5 (MISCELLANEOUS) IMPLANT
CLIP APPLIE ROT 10 11.4 M/L (STAPLE) IMPLANT
COVER MAYO STAND STRL (DRAPES) IMPLANT
COVER SURGICAL LIGHT HANDLE (MISCELLANEOUS) ×2 IMPLANT
DECANTER SPIKE VIAL GLASS SM (MISCELLANEOUS) ×2 IMPLANT
DRAIN CHANNEL 19F RND (DRAIN) ×1 IMPLANT
DRAPE C-ARM 42X120 X-RAY (DRAPES) ×1 IMPLANT
DRAPE LAPAROSCOPIC ABDOMINAL (DRAPES) ×2 IMPLANT
ELECT REM PT RETURN 9FT ADLT (ELECTROSURGICAL) ×2
ELECTRODE REM PT RTRN 9FT ADLT (ELECTROSURGICAL) ×1 IMPLANT
ENDOLOOP SUT PDS II  0 18 (SUTURE) ×2
ENDOLOOP SUT PDS II 0 18 (SUTURE) IMPLANT
FILTER SMOKE EVAC LAPAROSHD (FILTER) ×1 IMPLANT
GLOVE SURG SIGNA 7.5 PF LTX (GLOVE) ×2 IMPLANT
GOWN STRL REUS W/TWL XL LVL3 (GOWN DISPOSABLE) ×8 IMPLANT
HEMOSTAT SURGICEL 2X14 (HEMOSTASIS) ×1 IMPLANT
HEMOSTAT SURGICEL 4X8 (HEMOSTASIS) IMPLANT
IV CATH 14GX2 1/4 (CATHETERS) ×2 IMPLANT
IV SET EXTENSION CATH 6 NF (IV SETS) ×2 IMPLANT
KIT BASIN OR (CUSTOM PROCEDURE TRAY) ×2 IMPLANT
LIQUID BAND (GAUZE/BANDAGES/DRESSINGS) ×1 IMPLANT
POSITIONER SURGICAL ARM (MISCELLANEOUS) ×1 IMPLANT
POUCH RETRIEVAL ECOSAC 10 (ENDOMECHANICALS) IMPLANT
POUCH RETRIEVAL ECOSAC 10MM (ENDOMECHANICALS) ×1
POUCH SPECIMEN RETRIEVAL 10MM (ENDOMECHANICALS) ×1 IMPLANT
SCISSORS LAP 5X35 DISP (ENDOMECHANICALS) ×2 IMPLANT
SET IRRIG TUBING LAPAROSCOPIC (IRRIGATION / IRRIGATOR) ×2 IMPLANT
SLEEVE XCEL OPT CAN 5 100 (ENDOMECHANICALS) ×2 IMPLANT
STOPCOCK 4 WAY LG BORE MALE ST (IV SETS) ×3 IMPLANT
STRIP CLOSURE SKIN 1/4X4 (GAUZE/BANDAGES/DRESSINGS) ×2 IMPLANT
SUT ETHILON 2 0 PS N (SUTURE) ×1 IMPLANT
SUT MNCRL AB 4-0 PS2 18 (SUTURE) ×2 IMPLANT
TAPE CLOTH 4X10 WHT NS (GAUZE/BANDAGES/DRESSINGS) IMPLANT
TOWEL OR 17X26 10 PK STRL BLUE (TOWEL DISPOSABLE) ×2 IMPLANT
TRAY LAPAROSCOPIC (CUSTOM PROCEDURE TRAY) ×2 IMPLANT
TROCAR BLADELESS OPT 5 100 (ENDOMECHANICALS) ×2 IMPLANT
TROCAR XCEL BLUNT TIP 100MML (ENDOMECHANICALS) ×2 IMPLANT
TROCAR XCEL NON-BLD 11X100MML (ENDOMECHANICALS) ×1 IMPLANT
TUBING INSUF HEATED (TUBING) ×1 IMPLANT

## 2015-12-07 NOTE — Progress Notes (Signed)
PROGRESS NOTE    [name]  [id number] DOB: Apr 28, 1951 DOA: 12/05/2015 PCP: Vance Gather, MD  Outpatient Specialists:   Brief Narrative: 65 year old female with history of hypertension, documented liver cirrhosis and hypothyroidism. Patient initially presented to the ER 3 days ago with abdominal pain, severe nausea and vomiting, abnormal LFT's and severe jaundice but refused to be admitted. Patient continued to have abdominal pain, nausea and vomiting at home and decided to represent today. No fever or chills, no headache, no neck pain, no chest pain, no SOB, no urinary symptoms. CT scan done reveal obstructive picture with gall bladder disease. Bilirubin and other LFT's remain abnormal. Potassium is 3.1.  Assessment & Plan:   Active Problems:   Gallstone pancreatitis   Acute pancreatitis   Choledocholithiasis  Active Problems:  Gallstone pancreatitis  Acute pancreatitis   Assessment/Plan  Acute pancreatitis likely secondary to gallstone with gall bladder disease - Patient had ERCP yesterday. Lipase is within normal range. For possible lap cholecystectomy later this afternoon.  Hypertension - Optimize.  Hypokalemia - Potassium is 3.8 today.   GI and surgical input is appreciated.  Possible lap cholecystectomy later this afternoon  Continue IVF.  IV antibiotics  Supportive care  Optimize BP. Start lisinopril 20 MG po once daily. Coreg 3.125 MG po bid.   Monitor electrolytes and replete  DVT prophylaxis: Lovenox Code Status: Full Family Communication:  Disposition Plan: Home  Consults called: GI  Admission status: Inpatient   Consultants:   GI.   Surgery also consulted by the GI team.  Procedures:   Possible ERCP later today  Antimicrobials:   IV cipro and flagyl   Subjective: No new complaints. Feels a lot better. Denied any pain, nausea or vomiting.   Objective: Filed Vitals:   12/06/15 1543 12/06/15 2138 12/07/15 0503 12/07/15 1342    BP: 159/75 138/63 141/61 184/90  Pulse: 75 57 59 58  Temp: 98.2 F (36.8 C) 98 F (36.7 C) 98.1 F (36.7 C) 97.9 F (36.6 C)  TempSrc: Oral Oral Oral Oral  Resp: 19 20 20 20   Height:      Weight:      SpO2: 98% 98% 96% 100%    Intake/Output Summary (Last 24 hours) at 12/07/15 1405 Last data filed at 12/07/15 0951  Gross per 24 hour  Intake   4610 ml  Output      0 ml  Net   4610 ml   Filed Weights   12/05/15 1847 12/06/15 1242  Weight: 79.243 kg (174 lb 11.2 oz) 78.926 kg (174 lb)    Examination:  Constitutional:   Jaundiced. Appears calm and comfortable Eyes:   No pallor. Jaundice.  ENMT:   external ears, nose appear normal Neck:   Neck is supple. No JVD Respiratory:   CTA bilaterally, no w/r/r.   Respiratory effort normal. No retractions or accessory muscle use Cardiovascular:   Systolic murmur  No LE extremity edema Abdomen:   Abdomen is soft and non tender. Organs are difficult to assess. Neurologic:   Awake and alert.  Moves all limbs.   Data Reviewed: I have personally reviewed following labs and imaging studies  CBC:  Recent Labs Lab 12/02/15 1939 12/05/15 1136 12/06/15 0426 12/07/15 0448  WBC 13.0* 8.4 12.0* 8.7  HGB 13.7 12.1 11.3* 9.9*  HCT 37.0 32.9* 30.5* 26.9*  MCV 84.9 84.1 83.1 83.0  PLT 175 168 156 [id number]*   Basic Metabolic Panel:  Recent Labs Lab 12/02/15 1939 12/05/15 1136 12/05/15  1644 12/06/15 0426 12/07/15 0448  NA 134* 134*  --  133* 137  K 3.2* 3.1*  --  3.1* 3.8  CL 99* 98*  --  97* 104  CO2 26 28  --  26 27  GLUCOSE 180* 103*  --  98 99  BUN 18 10  --  9 11  CREATININE 0.75 0.47  --  <0.30* 0.49  CALCIUM 9.2 9.1  --  8.8* 8.5*  MG  --   --  1.5*  --   --   PHOS  --   --  3.7  --   --    GFR: Estimated Creatinine Clearance: 70.3 mL/min (by C-G formula based on Cr of 0.49). Liver Function Tests:  Recent Labs Lab 12/02/15 1939 12/05/15 1136 12/06/15 0426 12/07/15 0448  AST 266* 65*  59* 41  ALT 415* 178* 145* 107*  ALKPHOS 139* 178* 175* 165*  BILITOT 9.1* 9.5* 12.4* 9.6*  PROT 7.3 7.1 6.3* 6.0*  ALBUMIN 4.2 3.9 3.5 3.1*    Recent Labs Lab 12/02/15 1939 12/05/15 1136 12/06/15 0426 12/07/15 0448  LIPASE 3265* 1498* 1812* 43   No results for input(s): AMMONIA in the last 168 hours. Coagulation Profile:  Recent Labs Lab 12/05/15 1813 12/06/15 1759  INR 1.00 1.17   Cardiac Enzymes: No results for input(s): CKTOTAL, CKMB, CKMBINDEX, TROPONINI in the last 168 hours. BNP (last 3 results) No results for input(s): PROBNP in the last 8760 hours. HbA1C: No results for input(s): HGBA1C in the last 72 hours. CBG: No results for input(s): GLUCAP in the last 168 hours. Lipid Profile:  Recent Labs  12/06/15 0426  CHOL 163  HDL 25*  LDLCALC 112*  TRIG 131  CHOLHDL 6.5   Thyroid Function Tests:  Recent Labs  12/05/15 1649  TSH 2.795   Anemia Panel: No results for input(s): VITAMINB12, FOLATE, FERRITIN, TIBC, IRON, RETICCTPCT in the last 72 hours. Urine analysis:    Component Value Date/Time   COLORURINE ORANGE* 12/05/2015 1230   APPEARANCEUR CLEAR 12/05/2015 1230   LABSPEC 1.014 12/05/2015 1230   PHURINE 6.5 12/05/2015 1230   GLUCOSEU NEGATIVE 12/05/2015 1230   HGBUR NEGATIVE 12/05/2015 1230   BILIRUBINUR LARGE* 12/05/2015 1230   KETONESUR NEGATIVE 12/05/2015 1230   PROTEINUR NEGATIVE 12/05/2015 1230   NITRITE NEGATIVE 12/05/2015 1230   LEUKOCYTESUR TRACE* 12/05/2015 1230   Sepsis Labs: @LABRCNTIP (procalcitonin:4,lacticidven:4)  )No results found for this or any previous visit (from the past 240 hour(s)).       Radiology Studies: Ct Abdomen Pelvis W Contrast  12/05/2015  CLINICAL DATA:  Upper abdominal pain EXAM: CT ABDOMEN AND PELVIS WITH CONTRAST TECHNIQUE: Multidetector CT imaging of the abdomen and pelvis was performed using the standard protocol following bolus administration of intravenous contrast. CONTRAST:  168mL ISOVUE-300  IOPAMIDOL (ISOVUE-300) INJECTION 61% COMPARISON:  None. FINDINGS: Lower chest: Calcified granuloma identified in the anterior right lung base. Subsegmental atelectasis noted dependently in the lower lobes bilaterally. Hepatobiliary: Heterogeneous perfusion of the liver parenchyma is evident and liver contour is nodular, specially along the inferior right hepatic lobe. There is marked intra and extrahepatic biliary duct dilatation. The common bile duct measures up to 18 mm pancreatic head. Numerous calcified gallstones measure in the 4-7 mm size range. Gallbladder lumen is filled fluid and central material which may be tumefactive sludge. Gallbladder neoplasm considered less likely. 5 mm stone is identified in the region of the common bile duct/ main pancreatic duct confluence at the ampulla (best seen on coronal  image 62 of series 5). Pancreas: Diffuse distention of the main pancreatic duct is noted. No discrete pancreatic head mass is evident. Spleen: No splenomegaly. No focal mass lesion. Adrenals/Urinary Tract: No adrenal nodule or mass. 10 mm exophytic lesion in the upper pole left kidney (see image 108 series 5) has attenuation higher than be expected for a simple cyst. Enhancement in this lesion cannot be excluded. Small low-density lesions in the lower pole of the left kidney are probably cysts. No hydronephrosis. No evidence for hydroureter. No ureteral or bladder stones. Stomach/Bowel: Stomach is nondistended. No gastric wall thickening. No evidence of outlet obstruction. Duodenum is normally positioned as is the ligament of Treitz. No small bowel wall thickening. No small bowel dilatation. The terminal ileum is normal. Vascular/Lymphatic: There is abdominal aortic atherosclerosis without aneurysm. There is no gastrohepatic or hepatoduodenal ligament lymphadenopathy. No intraperitoneal or retroperitoneal lymphadenopathy. No pelvic sidewall lymphadenopathy. Reproductive: The uterus has normal CT imaging  appearance. There is no adnexal mass. Other: No intraperitoneal free fluid. Musculoskeletal: Loss vertebral body height at T12 may be related to inferior endplate compression. IMPRESSION: 1. Marked intra and extrahepatic biliary duct dilatation is associated with diffuse dilatation of the main pancreatic duct. These changes appear to be secondary to a 5 mm stone at the ampulla. No discrete pancreatic head mass is evident on today's study. ERCP may prove helpful to further evaluate. 2. Distended gallbladder with numerous calcified stones in the lumen. Noncalcified debris in the lumen of the gallbladder is probably tumefactive sludge. Gallbladder carcinoma is considered less likely. Follow-up recommended. 3. 10 mm high density lesion in the upper pole the left kidney. Small renal cell carcinoma not excluded. After resolution of acute symptoms, consider MRI without with contrast to further evaluate. 4. Abdominal aortic atherosclerosis. 5. Question mild inferior endplate compression fracture at T12. Electronically Signed   By: Misty Stanley M.D.   On: 12/05/2015 15:11   Dg Ercp Biliary & Pancreatic Ducts  12/06/2015  CLINICAL DATA:  Gallstone will EXAM: ERCP TECHNIQUE: Multiple spot images obtained with the fluoroscopic device and submitted for interpretation post-procedure. FLUOROSCOPY TIME:  Radiation Exposure Index (as provided by the fluoroscopic device): If the device does not provide the exposure index: Fluoroscopy Time:  3 minutes and 48 seconds Number of Acquired Images:  5 COMPARISON:  None. FINDINGS: The common bile duct is dilated. There is a filling defect in the lower common bile duct. IMPRESSION: See above. These images were submitted for radiologic interpretation only. Please see the procedural report for the amount of contrast and the fluoroscopy time utilized. Electronically Signed   By: Marybelle Killings M.D.   On: 12/06/2015 16:45        Scheduled Meds: . ciprofloxacin  400 mg Intravenous Q12H    . citalopram  20 mg Oral Daily  . levothyroxine  125 mcg Oral QAC breakfast  . metronidazole  500 mg Intravenous Q8H  . pantoprazole  20 mg Oral Daily  . potassium chloride  40 mEq Oral Once  . vitamin B-12  100 mcg Oral Daily   Continuous Infusions: . lactated ringers 125 mL/hr at 12/07/15 0951     LOS: 2 days    Time spent: 30 Minutes.    Dana Allan, MD  [facility] Pager #: [phone] 7PM-7AM contact night coverage as above

## 2015-12-07 NOTE — Anesthesia Preprocedure Evaluation (Addendum)
Anesthesia Evaluation  Patient identified by MRN, date of birth, ID band Patient awake    Reviewed: Allergy & Precautions, NPO status , Patient's Chart, lab work & pertinent test results  Airway Mallampati: II  TM Distance: >3 FB Neck ROM: Full    Dental   Pulmonary Current Smoker,    Pulmonary exam normal        Cardiovascular hypertension, Pt. on medications Normal cardiovascular exam     Neuro/Psych negative neurological ROS     GI/Hepatic negative GI ROS, (+) Cirrhosis       ,   Endo/Other  Hypothyroidism   Renal/GU negative Renal ROS     Musculoskeletal   Abdominal   Peds  Hematology  (+) anemia ,   Anesthesia Other Findings   Reproductive/Obstetrics                           Lab Results  Component Value Date   WBC 8.7 12/07/2015   HGB 9.9* 12/07/2015   HCT 26.9* 12/07/2015   MCV 83.0 12/07/2015   PLT 133* 12/07/2015   Lab Results  Component Value Date   CREATININE 0.49 12/07/2015   BUN 11 12/07/2015   NA 137 12/07/2015   K 3.8 12/07/2015   CL 104 12/07/2015   CO2 27 12/07/2015    Anesthesia Physical  Anesthesia Plan  ASA: III  Anesthesia Plan: General   Post-op Pain Management:    Induction: Intravenous  Airway Management Planned: Oral ETT  Additional Equipment:   Intra-op Plan:   Post-operative Plan: Extubation in OR  Informed Consent: I have reviewed the patients History and Physical, chart, labs and discussed the procedure including the risks, benefits and alternatives for the proposed anesthesia with the patient or authorized representative who has indicated his/her understanding and acceptance.     Plan Discussed with: CRNA and Surgeon  Anesthesia Plan Comments:         Anesthesia Quick Evaluation

## 2015-12-07 NOTE — Anesthesia Postprocedure Evaluation (Signed)
Anesthesia Post Note  Patient: [name]  Procedure(s) Performed: Procedure(s) (LRB): LAPAROSCOPIC CHOLECYSTECTOMY WITH INTRAOPERATIVE CHOLANGIOGRAM (N/A)  Patient location during evaluation: PACU Anesthesia Type: General Level of consciousness: awake and alert Pain management: pain level controlled Vital Signs Assessment: post-procedure vital signs reviewed and stable Respiratory status: spontaneous breathing, nonlabored ventilation, respiratory function stable and patient connected to nasal cannula oxygen Cardiovascular status: blood pressure returned to baseline and stable Postop Assessment: no signs of nausea or vomiting Anesthetic complications: no    Last Vitals:  Filed Vitals:   12/07/15 1930 12/07/15 1945  BP: 152/76 173/78  Pulse: 60 63  Temp:    Resp: 14 23    Last Pain:  Filed Vitals:   12/07/15 1949  PainSc: 4                  Tiajuana Amass

## 2015-12-07 NOTE — Progress Notes (Signed)
Her lipase is down to 43. Her T. Bili is 9.6. Her coags are okay.  Will plan cholecystectomy later today.  She understands the increased risk of gall bladder surgery with cirrhosis.  I may also try to biopsy the liver at the time of surgery, depending on the degree of cirrhosis.  The daughter was in the room with the patient.  Alphonsa Overall, MD, [facility] Surgery Pager: [phone] Office phone:  [phone]

## 2015-12-07 NOTE — Anesthesia Procedure Notes (Signed)
Procedure Name: Intubation Date/Time: 12/07/2015 3:48 PM Performed by: Glory Buff Pre-anesthesia Checklist: Patient identified, Emergency Drugs available, Suction available and Patient being monitored Patient Re-evaluated:Patient Re-evaluated prior to inductionOxygen Delivery Method: Circle system utilized Preoxygenation: Pre-oxygenation with 100% oxygen Intubation Type: IV induction Ventilation: Mask ventilation without difficulty Laryngoscope Size: Miller and 3 Grade View: Grade II Tube type: Oral Tube size: 7.0 mm Number of attempts: 1 Airway Equipment and Method: Stylet and Oral airway Placement Confirmation: ETT inserted through vocal cords under direct vision,  positive ETCO2 and breath sounds checked- equal and bilateral Secured at: 20 cm Tube secured with: Tape Dental Injury: Teeth and Oropharynx as per pre-operative assessment

## 2015-12-07 NOTE — Op Note (Signed)
12/05/2015 - 12/07/2015  6:37 PM  PATIENT:  [name], 65 y.o., female, MRN: [id number]  PREOP DIAGNOSIS:  Cholelithiasis, cholecystitis  POSTOP DIAGNOSIS:   Severe chronic and acute cholecystitis, adhesions over liver (Fitz-Hugh-Curtis adhesions) [photo in chart]  PROCEDURE:   Procedure(s): LAPAROSCOPIC CHOLECYSTECTOMY WITH INTRAOPERATIVE CHOLANGIOGRAM (5 port)  SURGEON:   Alphonsa Overall, M.D.  ASSISTANT:   B. Hoxworth, M.D and A. Marcello Moores, M.D.  ANESTHESIA:   general  Anesthesiologist: Suzette Battiest, MD CRNA: Glory Buff, CRNA  General  ASA: 3  EBL:  100  ml  BLOOD ADMINISTERED: none  DRAINS: 19 F  LOCAL MEDICATIONS USED:   32 cc 1/4% marcaine  SPECIMEN:   Gall bladder  COUNTS CORRECT:  YES  INDICATIONS FOR PROCEDURE:  [name] is a 65 y.o. (DOB: 12-Nov-1950)  female whose primary care physician is Vance Gather, MD and comes for cholecystectomy.   She was admitted for gallstone pancreatitis.  Her bilirubin got as high as 12.4.  She underwent a ERCP by Dr. Rande Brunt on 12/06/2015 for a common bile duct stone.  She now comes for cholecystectomy.  She also has a diagnosis of cirrhosis of the liver, but has never had a biopsy of the liver.   The indications and risks of the gall bladder surgery were explained to the patient.  The risks include, but are not limited to, infection, bleeding, common bile duct injury and open surgery.  SURGERY:  The patient was taken to OR room #6 at [facility].  The abdomen was prepped with chloroprep.  The patient was had Cipro and Flagyl prior to the beginning of the operation.   A time out was held and the surgical checklist run.   An infraumbilical incision was made into the abdominal cavity.  A 12 mm Hasson trocar was inserted into the abdominal cavity through the infraumbilical incision and secured with a 0 Vicryl suture.  Three additional trocars were inserted: a 10 mm trocar in the sub-xiphoid location, a 5 mm  trocar in the right mid subcostal area, and a 5 mm trocar in the right lateral subcostal area.  I ended up putting a 5th trocar midway between the xiphoid and umbilicus.   The abdomen was explored.  The liver probably had mild cirrhosis, but was not as bad as imagined pre op.  She did have Fitz-Hugh-Curtis adhesions over the liver. I took photos of these and put these in the chart. The stomach and bowel that could be seen were unremarkable.   The gall bladder was severely chronically and acutely inflamed and was walled off with the omentum.  There was purulence around the gall bladder.  The duodenum was stuck to the gall bladder.  This was taken down sharply.     Then the distal gall bladder had a Mirizzi's type compression on the common bile duct.   I was able to isolate the cystic duct, but the anatomy of the back of the gall bladder was densely scarred to the liver bed.  So I did a "top down" cholecystectomy.  I entered the gall bladder multiple times, spilling stones, gall bladder debris into the abdomen.  I spent time cleaning this up as I did the case.  Between the "top down" approach and the dissection around the cystic duct, I was able to define the anatomy.   Disssection was carried down to the gall bladder/cystic duct junction and the cystic duct isolated.  A clip was placed on the gall bladder  side of the cystic duct.   An intra-operative cholangiogram was shot.   The intra-operative cholangiogram was shot using a cut off Taut catheter placed through a 14 gauge angiocath in the RUQ.  The Taut catheter was inserted in the cut cystic duct and secured with an endoclip.  A cholangiogram was shot with 12 cc of 1/2 strength Omnipaque.  Using fluoroscopy, the cholangiogram showed the flow of contrast into the common bile duct, up the hepatic radicals, and into the duodenum.  There was no mass or obstruction.  There may have still been some debri in the common bile duct, but everything emptied.    The  Taut catheter was removed.  The cystic duct was too large for a clip, so I placed two PDS endoloops around the duct.  What remained of the gall bladder was bluntly and sharpley dissected from the gall bladder bed.   After the gall bladder was removed from the liver, the gall bladder bed and Triangle of [place] were inspected.  There was no bleeding or bile leak.  The gall bladder was placed in a endocatch bag and delivered through the umbilicus.  The abdomen was irrigated with 4,000 cc saline.   I did place a 69 F drain in the gall bladder fossa. And I placed surgicel in the gall bladder bed.   The trocars were then removed.  I infiltrated 32 cc of 1/4% Marcaine into the incisions.  The umbilical port closed with a 0 Vicryl suture and the skin closed with 4-0 Monocryl.  The skin was painted with LiquidBand.  The patient's sponge and needle count were correct.  The patient was transported to the RR in good condition.    She should complete 5 to 7 days of antibiotics after surgery.  Alphonsa Overall, MD, [facility] Surgery Pager: [phone] Office phone:  [phone]

## 2015-12-07 NOTE — Progress Notes (Signed)
Progress Note   Subjective  [name] is a 65 year old Caucasian female who was admitted to the hospital on 12/05/2015 with gallstone pancreatitis/choledocholithiasis. Patient did have ERCP yesterday which was successful for removal of a 5 mm stone. She is planned to have a cholecystectomy later this afternoon with possible biopsy of her liver due to her cirrhosis.  Patient tells me this morning that she is feeling well, she denies any new complaints or concerns. She does continue with some intermittent right upper quadrant pain. She is "very hungry", but is understanding of the fact that she will have surgery later today and cannot eat at this time. Her daughter is by her bedside and has no questions.   Objective   Vital signs in last 24 hours: Temp:  [98 F (36.7 C)-99.2 F (37.3 C)] 98.1 F (36.7 C) (06/14 0503) Pulse Rate:  [57-96] 59 (06/14 0503) Resp:  [18-22] 20 (06/14 0503) BP: (138-167)/(61-76) 141/61 mmHg (06/14 0503) SpO2:  [95 %-98 %] 96 % (06/14 0503) Weight:  [174 lb (78.926 kg)] 174 lb (78.926 kg) (06/13 1242) Last BM Date: 12/06/15 General:  Jaundiced Caucasian female in NAD Heart:  Regular rate and rhythm; no murmurs Lungs: Respirations even and unlabored, lungs CTA bilaterally Abdomen:  Soft, mild TTP in right upper quadrant and nondistended. Normal bowel sounds. Extremities:  Without edema. Neurologic:  Alert and oriented,  grossly normal neurologically. Psych:  Cooperative. Normal mood and affect.  Intake/Output from previous day: 06/13 0701 - 06/14 0700 In: 4490 [P.O.:240; I.V.:3150; IV Piggyback:1100] Out: 0  Intake/Output this shift: Total I/O In: 120 [P.O.:120] Out: -   Lab Results:  Recent Labs  12/05/15 1136 12/06/15 0426 12/07/15 0448  WBC 8.4 12.0* 8.7  HGB 12.1 11.3* 9.9*  HCT 32.9* 30.5* 26.9*  PLT 168 156 133*   BMET  Recent Labs  12/05/15 1136 12/06/15 0426 12/07/15 0448  NA 134* 133* 137  K 3.1* 3.1* 3.8  CL 98* 97*  104  CO2 28 26 27   GLUCOSE 103* 98 99  BUN 10 9 11   CREATININE 0.47 <0.30* 0.49  CALCIUM 9.1 8.8* 8.5*   LFT  Recent Labs  12/07/15 0448  PROT 6.0*  ALBUMIN 3.1*  AST 41  ALT 107*  ALKPHOS 165*  BILITOT 9.6*   PT/INR  Recent Labs  12/05/15 1813 12/06/15 1759  LABPROT 13.4 14.6  INR 1.00 1.17    Studies/Results: Ct Abdomen Pelvis W Contrast  12/05/2015  CLINICAL DATA:  Upper abdominal pain EXAM: CT ABDOMEN AND PELVIS WITH CONTRAST TECHNIQUE: Multidetector CT imaging of the abdomen and pelvis was performed using the standard protocol following bolus administration of intravenous contrast. CONTRAST:  161mL ISOVUE-300 IOPAMIDOL (ISOVUE-300) INJECTION 61% COMPARISON:  None. FINDINGS: Lower chest: Calcified granuloma identified in the anterior right lung base. Subsegmental atelectasis noted dependently in the lower lobes bilaterally. Hepatobiliary: Heterogeneous perfusion of the liver parenchyma is evident and liver contour is nodular, specially along the inferior right hepatic lobe. There is marked intra and extrahepatic biliary duct dilatation. The common bile duct measures up to 18 mm pancreatic head. Numerous calcified gallstones measure in the 4-7 mm size range. Gallbladder lumen is filled fluid and central material which may be tumefactive sludge. Gallbladder neoplasm considered less likely. 5 mm stone is identified in the region of the common bile duct/ main pancreatic duct confluence at the ampulla (best seen on coronal image 62 of series 5). Pancreas: Diffuse distention of the main pancreatic duct is noted. No discrete  pancreatic head mass is evident. Spleen: No splenomegaly. No focal mass lesion. Adrenals/Urinary Tract: No adrenal nodule or mass. 10 mm exophytic lesion in the upper pole left kidney (see image 108 series 5) has attenuation higher than be expected for a simple cyst. Enhancement in this lesion cannot be excluded. Small low-density lesions in the lower pole of the left  kidney are probably cysts. No hydronephrosis. No evidence for hydroureter. No ureteral or bladder stones. Stomach/Bowel: Stomach is nondistended. No gastric wall thickening. No evidence of outlet obstruction. Duodenum is normally positioned as is the ligament of Treitz. No small bowel wall thickening. No small bowel dilatation. The terminal ileum is normal. Vascular/Lymphatic: There is abdominal aortic atherosclerosis without aneurysm. There is no gastrohepatic or hepatoduodenal ligament lymphadenopathy. No intraperitoneal or retroperitoneal lymphadenopathy. No pelvic sidewall lymphadenopathy. Reproductive: The uterus has normal CT imaging appearance. There is no adnexal mass. Other: No intraperitoneal free fluid. Musculoskeletal: Loss vertebral body height at T12 may be related to inferior endplate compression. IMPRESSION: 1. Marked intra and extrahepatic biliary duct dilatation is associated with diffuse dilatation of the main pancreatic duct. These changes appear to be secondary to a 5 mm stone at the ampulla. No discrete pancreatic head mass is evident on today's study. ERCP may prove helpful to further evaluate. 2. Distended gallbladder with numerous calcified stones in the lumen. Noncalcified debris in the lumen of the gallbladder is probably tumefactive sludge. Gallbladder carcinoma is considered less likely. Follow-up recommended. 3. 10 mm high density lesion in the upper pole the left kidney. Small renal cell carcinoma not excluded. After resolution of acute symptoms, consider MRI without with contrast to further evaluate. 4. Abdominal aortic atherosclerosis. 5. Question mild inferior endplate compression fracture at T12. Electronically Signed   By: Misty Stanley M.D.   On: 12/05/2015 15:11   Dg Ercp Biliary & Pancreatic Ducts  12/06/2015  CLINICAL DATA:  Gallstone will EXAM: ERCP TECHNIQUE: Multiple spot images obtained with the fluoroscopic device and submitted for interpretation post-procedure.  FLUOROSCOPY TIME:  Radiation Exposure Index (as provided by the fluoroscopic device): If the device does not provide the exposure index: Fluoroscopy Time:  3 minutes and 48 seconds Number of Acquired Images:  5 COMPARISON:  None. FINDINGS: The common bile duct is dilated. There is a filling defect in the lower common bile duct. IMPRESSION: See above. These images were submitted for radiologic interpretation only. Please see the procedural report for the amount of contrast and the fluoroscopy time utilized. Electronically Signed   By: Marybelle Killings M.D.   On: 12/06/2015 16:45   ERCP 12/06/15: Dr. Ardis Hughs: Impression: Choledocholithiasis is found. This was treated with biliary sphincterotomy and balloon sweeping. During the case purulent, dark (somewhat blood-tinged) bile was found. The blood was not bright red and did not appear to be coming from the sphincterotomy. It was felt this is likely due to somewhat more remote oozing of the bile duct related to cholangitis, irritation from the impacted CBD stone.     Assessment / Plan:   Impression: 1. Gallstone pancreatitis: ERCP performed yesterday with removal of 5 mm common bile duct stone. (as above)LFTs improving today. 2. Elevated LFTS: Improving 3. Elevated lipase: Improved 4. H/o Cirrhosis: due to etoh use in the past, denies etoh use since 2003; plans for possible biopsy today during cholecystectomy by surgery.  Plan: 1. Per Dr. Ardis Hughs, patient should continue IV antibiotics for a total of 48 hours after time of the ERCP yesterday and then 5 more days of oral  antibiotics given the frank purulent cholangitis noted during the exam 2. Continue to trend LFTs, though these are improving and are expected to continue to improve. 3. Will defer further recommendations to surgery after time of procedure today. 4. Discussed above with Dr. Havery Moros  Thank you for your kind consultation, we will sign off, please let us know if we may be of any further  assistance.  Active Problems:   Gallstone pancreatitis   Acute pancreatitis   Choledocholithiasis     LOS: 2 days   Levin Erp  12/07/2015, 10:11 AM

## 2015-12-07 NOTE — Transfer of Care (Signed)
Immediate Anesthesia Transfer of Care Note  Patient: [name]  Procedure(s) Performed: Procedure(s): LAPAROSCOPIC CHOLECYSTECTOMY WITH INTRAOPERATIVE CHOLANGIOGRAM (N/A)  Patient Location: PACU  Anesthesia Type:General  Level of Consciousness: awake, alert  and oriented  Airway & Oxygen Therapy: Patient Spontanous Breathing and Patient connected to face mask oxygen  Post-op Assessment: Report given to RN and Post -op Vital signs reviewed and stable  Post vital signs: Reviewed and stable  Last Vitals:  Filed Vitals:   12/07/15 0503 12/07/15 1342  BP: 141/61 184/90  Pulse: 59 58  Temp: 36.7 C 36.6 C  Resp: 20 20    Last Pain:  Filed Vitals:   12/07/15 1343  PainSc: 0-No pain      Patients Stated Pain Goal: 2 ([id number])  Complications: No apparent anesthesia complications

## 2015-12-08 ENCOUNTER — Encounter (HOSPITAL_COMMUNITY): Payer: Self-pay | Admitting: Surgery

## 2015-12-08 LAB — COMPREHENSIVE METABOLIC PANEL
ALBUMIN: 3 g/dL — AB (ref 3.5–5.0)
ALK PHOS: 139 U/L — AB (ref 38–126)
ALT: 89 U/L — ABNORMAL HIGH (ref 14–54)
ANION GAP: 5 (ref 5–15)
AST: 51 U/L — ABNORMAL HIGH (ref 15–41)
BUN: 9 mg/dL (ref 6–20)
CALCIUM: 8.2 mg/dL — AB (ref 8.9–10.3)
CO2: 27 mmol/L (ref 22–32)
Chloride: 102 mmol/L (ref 101–111)
Creatinine, Ser: 0.33 mg/dL — ABNORMAL LOW (ref 0.44–1.00)
GFR calc non Af Amer: >60 mL/min (ref >60)
GLUCOSE: 148 mg/dL — AB (ref 65–99)
POTASSIUM: 4.2 mmol/L (ref 3.5–5.1)
SODIUM: 134 mmol/L — AB (ref 135–145)
TOTAL PROTEIN: 5.6 g/dL — AB (ref 6.5–8.1)
Total Bilirubin: 5.8 mg/dL — ABNORMAL HIGH (ref 0.3–1.2)

## 2015-12-08 MED ORDER — HEPARIN SODIUM (PORCINE) 5000 UNIT/ML IJ SOLN
5000.0000 [IU] | Freq: Three times a day (TID) | INTRAMUSCULAR | Status: DC
  Administered 2015-12-08 – 2015-12-09 (×3): 5000 [IU] via SUBCUTANEOUS
  Filled 2015-12-08 (×3): qty 1

## 2015-12-08 MED ORDER — CARVEDILOL 6.25 MG PO TABS
6.2500 mg | ORAL_TABLET | Freq: Two times a day (BID) | ORAL | Status: DC
  Administered 2015-12-08 – 2015-12-09 (×3): 6.25 mg via ORAL
  Filled 2015-12-08 (×3): qty 1

## 2015-12-08 NOTE — Care Management Important Message (Signed)
Important Message  Patient Details  Name: LARAYAH SPAKE MRN: [id number] Date of Birth: [birth year]   Medicare Important Message Given:  Yes    Camillo Flaming 12/08/2015, 11:26 AMImportant Message  Patient Details  Name: SIARRAH BARTNIK MRN: [id number] Date of Birth: 1951-03-17   Medicare Important Message Given:  Yes    Camillo Flaming 12/08/2015, 11:26 AM

## 2015-12-08 NOTE — Progress Notes (Signed)
[facility] Office:  [phone] General Surgery Progress Note   LOS: 3 days  POD -  1 Day Post-Op  Assessment/Plan: 1.  LAPAROSCOPIC CHOLECYSTECTOMY WITH INTRAOPERATIVE CHOLANGIOGRAM - 12/07/2015 - D. Anayah Arvanitis  For severe acute and chronic cholecystitis  T. Bili - 5.8 - 12/08/2015  Cipro/Flagyl - 6/12 >>>  She will need antibiotics a total of 7 days post op  Will leave drain for now.  She will seen in our office next week to remove the drain.  Will advance to reg low fat diet.  She should be able to go home tomorrow AM.  2.  ERCP - 12/06/2015 - D. Jacobs  Gallstone pancreatitis.  Stone cleared.  3.  Cirrhosis - though this looks fairly mild 4.  HTN 5.  Anemia - Hgb - 9.9 - 12/08/2015 5.  DVT prophylaxis - Will start chemoprophylaxis with SQ Heparin   Active Problems:   Gallstone pancreatitis   Acute pancreatitis   Choledocholithiasis   Acute gallstone pancreatitis   Subjective:  Looks better than I would have expected.  Ready to eat more.  Objective:   Filed Vitals:   12/08/15 0524 12/08/15 0941  BP: 178/68 164/76  Pulse: 58 60  Temp: 98.6 F (37 C)   Resp: 18      Intake/Output from previous day:  06/14 0701 - 06/15 0700 In: 1020 [P.O.:120; I.V.:900] Out: 285 [Drains:235; Blood:50]  Intake/Output this shift:  Total I/O In: 82 [P.O.:560] Out: 20 [Drains:20]   Physical Exam:   General: Mildly obese F who is alert and oriented.    HEENT: Normal. Pupils equal. .   Lungs: Clear   Abdomen: Soft.  BS present   Wound: Clean.  Drain in RUQ - 285 cc recorded yesterday   Lab Results:    Recent Labs  12/06/15 0426 12/07/15 0448  WBC 12.0* 8.7  HGB 11.3* 9.9*  HCT 30.5* 26.9*  PLT 156 133*    BMET   Recent Labs  12/07/15 0448 12/08/15 0314  NA 137 134*  K 3.8 4.2  CL 104 102  CO2 27 27  GLUCOSE 99 148*  BUN 11 9  CREATININE 0.49 0.33*  CALCIUM 8.5* 8.2*    PT/INR   Recent Labs  12/05/15 1813 12/06/15 1759  LABPROT 13.4 14.6   INR 1.00 1.17    ABG  No results for input(s): PHART, HCO3 in the last 72 hours.  Invalid input(s): PCO2, PO2   Studies/Results:  Dg Cholangiogram Operative  12/07/2015  CLINICAL DATA:  Cholelithiasis EXAM: INTRAOPERATIVE CHOLANGIOGRAM TECHNIQUE: Cholangiographic images from the C-arm fluoroscopic device were submitted for interpretation post-operatively. Please see the procedural report for the amount of contrast and the fluoroscopy time utilized. COMPARISON:  Endoscopy 12/06/2015 FINDINGS: No persistent filling defects in the common duct. Intrahepatic ducts are incompletely visualized, appearing decompressed centrally. Contrast passes into the duodenum. : Negative for retained common duct stone. Electronically Signed   By: Lucrezia Europe M.D.   On: 12/07/2015 19:38   Dg Ercp Biliary & Pancreatic Ducts  12/06/2015  CLINICAL DATA:  Gallstone will EXAM: ERCP TECHNIQUE: Multiple spot images obtained with the fluoroscopic device and submitted for interpretation post-procedure. FLUOROSCOPY TIME:  Radiation Exposure Index (as provided by the fluoroscopic device): If the device does not provide the exposure index: Fluoroscopy Time:  3 minutes and 48 seconds Number of Acquired Images:  5 COMPARISON:  None. FINDINGS: The common bile duct is dilated. There is a filling defect in the lower common bile duct. IMPRESSION:  See above. These images were submitted for radiologic interpretation only. Please see the procedural report for the amount of contrast and the fluoroscopy time utilized. Electronically Signed   By: Marybelle Killings M.D.   On: 12/06/2015 16:45     Anti-infectives:   Anti-infectives    Start     Dose/Rate Route Frequency Ordered Stop   12/05/15 2200  metroNIDAZOLE (FLAGYL) IVPB 500 mg     500 mg 100 mL/hr over 60 Minutes Intravenous Every 8 hours 12/05/15 2021     12/05/15 1715  ciprofloxacin (CIPRO) IVPB 400 mg     400 mg 200 mL/hr over 60 Minutes Intravenous Every 12 hours 12/05/15 1705         Alphonsa Overall, MD, FACS Pager: [facility] Office: [phone] 12/08/2015

## 2015-12-08 NOTE — Discharge Instructions (Signed)
Laparoscopic Cholecystectomy, Care After Refer to this sheet in the next few weeks. These instructions provide you with information about caring for yourself after your procedure. Your health care provider may also give you more specific instructions. Your treatment has been planned according to current medical practices, but problems sometimes occur. Call your health care provider if you have any problems or questions after your procedure. WHAT TO EXPECT AFTER THE PROCEDURE After your procedure, it is common to have:  Pain at your incision sites. You will be given pain medicines to control your pain.  Mild nausea or vomiting. This should improve after the first 24 hours.  Bloating and possible shoulder pain from the gas that was used during the procedure. This will improve after the first 24 hours. HOME CARE INSTRUCTIONS Incision Care  Follow instructions from your health care provider about how to take care of your incisions. Make sure you:  Wash your hands with soap and water before you change your bandage (dressing). If soap and water are not available, use hand sanitizer.  Change your dressing as told by your health care provider.  Leave stitches (sutures), skin glue, or adhesive strips in place. These skin closures may need to be in place for 2 weeks or longer. If adhesive strip edges start to loosen and curl up, you may trim the loose edges. Do not remove adhesive strips completely unless your health care provider tells you to do that.  Do not take baths, swim, or use a hot tub until your health care provider approves. Ask your health care provider if you can take showers. You may only be allowed to take sponge baths for bathing. General Instructions  Take over-the-counter and prescription medicines only as told by your health care provider.  Do not drive or operate heavy machinery while taking prescription pain medicine.  Return to your normal diet as told by your health care  provider.  Do not lift anything that is heavier than 10 lb (4.5 kg).  Do not play contact sports for one week or until your health care provider approves. SEEK MEDICAL CARE IF:   You have redness, swelling, or pain at the site of your incision.  You have fluid, blood, or pus coming from your incision.  You notice a bad smell coming from your incision area.  Your surgical incisions break open.  You have a fever. SEEK IMMEDIATE MEDICAL CARE IF:  You develop a rash.  You have difficulty breathing.  You have chest pain.  You have increasing pain in your shoulders (shoulder strap areas).  You faint or have dizzy episodes while you are standing.  You have severe pain in your abdomen.  You have nausea or vomiting that lasts for more than one day.   This information is not intended to replace advice given to you by your health care provider. Make sure you discuss any questions you have with your health care provider.   Document Released: 06/11/2005 Document Revised: 03/02/2015 Document Reviewed: 01/21/2013 Elsevier Interactive Patient Education 2016 [place] ______[employer] LAPAROSCOPIC SURGERY: POST OP INSTRUCTIONS Always review your discharge instruction sheet given to you by the facility where your surgery was performed. IF YOU HAVE DISABILITY OR FAMILY LEAVE FORMS, YOU MUST BRING THEM TO THE OFFICE FOR PROCESSING.   DO NOT GIVE THEM TO YOUR DOCTOR.  1. A prescription for pain medication may be given to you upon discharge.  Take your pain medication as prescribed, if needed.  If  narcotic pain medicine is not needed, then you may take acetaminophen (Tylenol) or ibuprofen (Advil) as needed. 2. Take your usually prescribed medications unless otherwise directed. 3. If you need a refill on your pain medication, please contact your pharmacy.  They will contact our office to request authorization. Prescriptions will not be filled after 5pm or on  week-ends. 4. You should follow a light diet the first few days after arrival home, such as soup and crackers, etc.  Be sure to include lots of fluids daily. 5. Most patients will experience some swelling and bruising in the area of the incisions.  Ice packs will help.  Swelling and bruising can take several days to resolve.  6. It is common to experience some constipation if taking pain medication after surgery.  Increasing fluid intake and taking a stool softener (such as Colace) will usually help or prevent this problem from occurring.  A mild laxative (Milk of Magnesia or Miralax) should be taken according to package instructions if there are no bowel movements after 48 hours. 7. Unless discharge instructions indicate otherwise, you may remove your bandages 24-48 hours after surgery, and you may shower at that time.  You may have steri-strips (small skin tapes) in place directly over the incision.  These strips should be left on the skin for 7-10 days.  If your surgeon used skin glue on the incision, you may shower in 24 hours.  The glue will flake off over the next 2-3 weeks.  Any sutures or staples will be removed at the office during your follow-up visit. 8. ACTIVITIES:  You may resume regular (light) daily activities beginning the next day--such as daily self-care, walking, climbing stairs--gradually increasing activities as tolerated.  You may have sexual intercourse when it is comfortable.  Refrain from any heavy lifting or straining until approved by your doctor. a. You may drive when you are no longer taking prescription pain medication, you can comfortably wear a seatbelt, and you can safely maneuver your car and apply brakes. b. RETURN TO WORK:  __________________________________________________________ 9. You should see your doctor in the office for a follow-up appointment approximately 2-3 weeks after your surgery.  Make sure that you call for this appointment within a day or two after you  arrive home to insure a convenient appointment time. 10. OTHER INSTRUCTIONS: __________________________________________________________________________________________________________________________ __________________________________________________________________________________________________________________________ WHEN TO CALL YOUR DOCTOR: 1. Fever over 101.0 2. Inability to urinate 3. Continued bleeding from incision. 4. Increased pain, redness, or drainage from the incision. 5. Increasing abdominal pain  The clinic staff is available to answer your questions during regular business hours.  Please dont hesitate to call and ask to speak to one of the nurses for clinical concerns.  If you have a medical emergency, go to the nearest emergency room or call 911.  A surgeon from [facility] is always on call at the hospital. [address] [place], [place], [place]  [address] ? [address], [place], [place]   [address] [phone] ? [phone] ? FAX [phone] Web site: [email or web address]  Bulb Drain Home Care A bulb drain consists of a thin rubber tube and a soft, round bulb that creates a gentle suction. The rubber tube is placed in the area where you had surgery. A bulb is attached to the end of the tube that is outside the body. The bulb drain removes excess fluid that normally builds up in a surgical wound after surgery. The color and amount of fluid will vary. Immediately  after surgery, the fluid is bright red and is a little thicker than water. It may gradually change to a yellow or pink color and become more thin and water-like. When the amount decreases to about 1 or 2 tbsp in 24 hours, your health care provider will usually remove it. DAILY CARE  Keep the bulb flat (compressed) at all times, except while emptying it. The flatness creates suction. You can flatten the bulb by squeezing it firmly in the middle and then closing the cap.  Keep sites  where the tube enters the skin dry and covered with a bandage (dressing).  Secure the tube 1-2 in (2.5-5.1 cm) below the insertion sites to keep it from pulling on your stitches. The tube is stitched in place and will not slip out.  Secure the bulb as directed by your health care provider.  For the first 3 days after surgery, there usually is more fluid in the bulb. Empty the bulb whenever it becomes half full because the bulb does not create enough suction if it is too full. The bulb could also overflow. Write down how much fluid you remove each time you empty your drain. Add up the amount removed in 24 hours.  Empty the bulb at the same time every day once the amount of fluid decreases and you only need to empty it once a day. Write down the amounts and the 24-hour totals to give to your health care provider. This helps your health care provider know when the tubes can be removed. EMPTYING THE BULB DRAIN Before emptying the bulb, get a measuring cup, a piece of paper and a pen, and wash your hands.  Gently run your fingers down the tube (stripping) to empty any drainage from the tubing into the bulb. This may need to be done several times a day to clear the tubing of clots and tissue.  Open the bulb cap to release suction, which causes it to inflate. Do not touch the inside of the cap.  Gently run your fingers down the tube (stripping) to empty any drainage from the tubing into the bulb.  Hold the cap out of the way, and pour fluid into the measuring cup.   Squeeze the bulb to provide suction.  Replace the cap.   Check the tape that holds the tube to your skin. If it is becoming loose, you can remove the loose piece of tape and apply a new one. Then, pin the bulb to your shirt.   Write down the amount of fluid you emptied out. Write down the date and each time you emptied your bulb drain. (If there are 2 bulbs, note the amount of drainage from each bulb and keep the totals separate.  Your health care provider will want to know the total amounts for each drain and which tube is draining more.)   Flush the fluid down the toilet and wash your hands.   Call your health care provider once you have less than 2 tbsp of fluid collecting in the bulb drain every 24 hours. If there is drainage around the tube site, change dressings and keep the area dry. Cleanse around tube with sterile saline and place dry gauze around site. This gauze should be changed when it is soiled. If it stays clean and unsoiled, it should still be changed daily.  SEEK MEDICAL CARE IF:  Your drainage has a bad smell or is cloudy.   You have a fever.   Your drainage is increasing  instead of decreasing.   Your tube fell out.   You have redness or swelling around the tube site.   You have drainage from a surgical wound.   Your bulb drain will not stay flat after you empty it.  MAKE SURE YOU:   Understand these instructions.  Will watch your condition.  Will get help right away if you are not doing well or get worse.   This information is not intended to replace advice given to you by your health care provider. Make sure you discuss any questions you have with your health care provider.   Document Released: 06/08/2000 Document Revised: 07/02/2014 Document Reviewed: 12/29/2014 Elsevier Interactive Patient Education [employer].

## 2015-12-08 NOTE — Progress Notes (Signed)
PROGRESS NOTE    [name]  [id number] DOB: 1950/12/11 DOA: 12/05/2015 PCP: Vance Gather, MD  Outpatient Specialists:   Brief Narrative: 65 year old female with history of hypertension, documented liver cirrhosis and hypothyroidism. Patient initially presented to the ER 3 days ago with abdominal pain, severe nausea and vomiting, abnormal LFT's and severe jaundice but refused to be admitted. Patient continued to have abdominal pain, nausea and vomiting at home and decided to represent today. No fever or chills, no headache, no neck pain, no chest pain, no SOB, no urinary symptoms. CT scan done reveal obstructive picture with gall bladder disease. Bilirubin and other LFT's remain abnormal. Potassium is 3.1.  Assessment & Plan:   Active Problems:   Gallstone pancreatitis   Acute pancreatitis   Choledocholithiasis   Acute gallstone pancreatitis  Active Problems:  Gallstone pancreatitis  Acute pancreatitis   Assessment/Plan  Acute pancreatitis likely secondary to gallstone with gall bladder disease - Patient underwent cholecystectomy yesterday. Bilirubin is trending downwards. Surgical drain is still in place. Surgical input is appreciated.   Hypertension - Optimize. Coreg increased to 6.25mg  po bid. Optimize pain control as well.  Hypokalemia - Resolved. Potassium is 4.2 today.  Continue IVF.  IV antibiotics  Supportive care  Monitor electrolytes and replete  DVT prophylaxis: Lovenox Code Status: Full Family Communication:  Disposition Plan: Home  Consults called: GI  Admission status: Inpatient   Consultants:   GI.  Surgery    Procedures:   ERCP  Cholecystectomy  Antimicrobials:   IV cipro and flagyl   Subjective: No new complaints. Continues to feel better. No nausea or vomiting.   Objective: Filed Vitals:   12/07/15 2000 12/07/15 2012 12/07/15 2206 12/08/15 0524  BP:  159/59 164/67 178/68  Pulse: 59 61 58 58  Temp: 98.1 F (36.7 C) 98.2  F (36.8 C) 97.4 F (36.3 C) 98.6 F (37 C)  TempSrc:   Oral Oral  Resp: 17 16 18 18   Height:      Weight:      SpO2: 98% 100% 94% 96%    Intake/Output Summary (Last 24 hours) at 12/08/15 0929 Last data filed at 12/08/15 0844  Gross per 24 hour  Intake   1520 ml  Output    305 ml  Net   1215 ml   Filed Weights   12/05/15 1847 12/06/15 1242  Weight: 79.243 kg (174 lb 11.2 oz) 78.926 kg (174 lb)    Examination:  Constitutional:   Jaundiced. Appears calm and comfortable Eyes:   No pallor. Jaundice.  ENMT:   external ears, nose appear normal Neck:   Neck is supple. No JVD Respiratory:   CTA bilaterally, no w/r/r.   Respiratory effort normal. No retractions or accessory muscle use Cardiovascular:   Systolic murmur  No LE extremity edema Abdomen:   Abdomen is soft and non tender. Surgical drain in place. Organs are difficult to assess. Neurologic:   Awake and alert.  Moves all limbs.   Data Reviewed: I have personally reviewed following labs and imaging studies  CBC:  Recent Labs Lab 12/02/15 1939 12/05/15 1136 12/06/15 0426 12/07/15 0448  WBC 13.0* 8.4 12.0* 8.7  HGB 13.7 12.1 11.3* 9.9*  HCT 37.0 32.9* 30.5* 26.9*  MCV 84.9 84.1 83.1 83.0  PLT 175 168 156 [id number]*   Basic Metabolic Panel:  Recent Labs Lab 12/02/15 1939 12/05/15 1136 12/05/15 1644 12/06/15 0426 12/07/15 0448 12/08/15 0314  NA 134* 134*  --  133* 137 134*  K 3.2* 3.1*  --  3.1* 3.8 4.2  CL 99* 98*  --  97* 104 102  CO2 26 28  --  26 27 27   GLUCOSE 180* 103*  --  98 99 148*  BUN 18 10  --  9 11 9   CREATININE 0.75 0.47  --  <0.30* 0.49 0.33*  CALCIUM 9.2 9.1  --  8.8* 8.5* 8.2*  MG  --   --  1.5*  --   --   --   PHOS  --   --  3.7  --   --   --    GFR: Estimated Creatinine Clearance: 70.3 mL/min (by C-G formula based on Cr of 0.33). Liver Function Tests:  Recent Labs Lab 12/02/15 1939 12/05/15 1136 12/06/15 0426 12/07/15 0448 12/08/15 0314  AST  266* 65* 59* 41 51*  ALT 415* 178* 145* 107* 89*  ALKPHOS 139* 178* 175* 165* 139*  BILITOT 9.1* 9.5* 12.4* 9.6* 5.8*  PROT 7.3 7.1 6.3* 6.0* 5.6*  ALBUMIN 4.2 3.9 3.5 3.1* 3.0*    Recent Labs Lab 12/02/15 1939 12/05/15 1136 12/06/15 0426 12/07/15 0448  LIPASE 3265* 1498* 1812* 43   No results for input(s): AMMONIA in the last 168 hours. Coagulation Profile:  Recent Labs Lab 12/05/15 1813 12/06/15 1759  INR 1.00 1.17   Cardiac Enzymes: No results for input(s): CKTOTAL, CKMB, CKMBINDEX, TROPONINI in the last 168 hours. BNP (last 3 results) No results for input(s): PROBNP in the last 8760 hours. HbA1C: No results for input(s): HGBA1C in the last 72 hours. CBG: No results for input(s): GLUCAP in the last 168 hours. Lipid Profile:  Recent Labs  12/06/15 0426  CHOL 163  HDL 25*  LDLCALC 112*  TRIG 131  CHOLHDL 6.5   Thyroid Function Tests:  Recent Labs  12/05/15 1649  TSH 2.795   Anemia Panel: No results for input(s): VITAMINB12, FOLATE, FERRITIN, TIBC, IRON, RETICCTPCT in the last 72 hours. Urine analysis:    Component Value Date/Time   COLORURINE ORANGE* 12/05/2015 1230   APPEARANCEUR CLEAR 12/05/2015 1230   LABSPEC 1.014 12/05/2015 1230   PHURINE 6.5 12/05/2015 1230   GLUCOSEU NEGATIVE 12/05/2015 1230   HGBUR NEGATIVE 12/05/2015 1230   BILIRUBINUR LARGE* 12/05/2015 1230   KETONESUR NEGATIVE 12/05/2015 1230   PROTEINUR NEGATIVE 12/05/2015 1230   NITRITE NEGATIVE 12/05/2015 1230   LEUKOCYTESUR TRACE* 12/05/2015 1230   Sepsis Labs: @LABRCNTIP (procalcitonin:4,lacticidven:4)  ) Recent Results (from the past 240 hour(s))  Surgical pcr screen     Status: None   Collection Time: 12/07/15  1:40 PM  Result Value Ref Range Status   MRSA, PCR NEGATIVE NEGATIVE Final   Staphylococcus aureus NEGATIVE NEGATIVE Final    Comment:        The Xpert SA Assay (FDA approved for NASAL specimens in patients over 31 years of age), is one component of a  comprehensive surveillance program.  Test performance has been validated by [facility] for patients greater than or equal to 39 year old. It is not intended to diagnose infection nor to guide or monitor treatment.          Radiology Studies: Dg Cholangiogram Operative  12/07/2015  CLINICAL DATA:  Cholelithiasis EXAM: INTRAOPERATIVE CHOLANGIOGRAM TECHNIQUE: Cholangiographic images from the C-arm fluoroscopic device were submitted for interpretation post-operatively. Please see the procedural report for the amount of contrast and the fluoroscopy time utilized. COMPARISON:  Endoscopy 12/06/2015 FINDINGS: No persistent filling defects in the common duct. Intrahepatic ducts are incompletely visualized,  appearing decompressed centrally. Contrast passes into the duodenum. : Negative for retained common duct stone. Electronically Signed   By: Lucrezia Europe M.D.   On: 12/07/2015 19:38   Dg Ercp Biliary & Pancreatic Ducts  12/06/2015  CLINICAL DATA:  Gallstone will EXAM: ERCP TECHNIQUE: Multiple spot images obtained with the fluoroscopic device and submitted for interpretation post-procedure. FLUOROSCOPY TIME:  Radiation Exposure Index (as provided by the fluoroscopic device): If the device does not provide the exposure index: Fluoroscopy Time:  3 minutes and 48 seconds Number of Acquired Images:  5 COMPARISON:  None. FINDINGS: The common bile duct is dilated. There is a filling defect in the lower common bile duct. IMPRESSION: See above. These images were submitted for radiologic interpretation only. Please see the procedural report for the amount of contrast and the fluoroscopy time utilized. Electronically Signed   By: Marybelle Killings M.D.   On: 12/06/2015 16:45        Scheduled Meds: . carvedilol  6.25 mg Oral BID WC  . ciprofloxacin  400 mg Intravenous Q12H  . citalopram  20 mg Oral Daily  . levothyroxine  125 mcg Oral QAC breakfast  . lisinopril  20 mg Oral Daily  . metronidazole  500 mg  Intravenous Q8H  . pantoprazole  20 mg Oral Daily  . potassium chloride  40 mEq Oral Once  . vitamin B-12  100 mcg Oral Daily   Continuous Infusions: . lactated ringers 125 mL/hr at 12/08/15 0445     LOS: 3 days    Time spent: 30 Minutes.    Dana Allan, MD  [facility] Pager #: [phone] 7PM-7AM contact night coverage as above

## 2015-12-09 LAB — CBC WITH DIFFERENTIAL/PLATELET
Basophils Absolute: 0 10*3/uL (ref 0.0–0.1)
Basophils Relative: 0 %
Eosinophils Absolute: 0.1 10*3/uL (ref 0.0–0.7)
Eosinophils Relative: 1 %
HCT: 28.2 % — ABNORMAL LOW (ref 36.0–46.0)
Hemoglobin: 10.4 g/dL — ABNORMAL LOW (ref 12.0–15.0)
Lymphocytes Relative: 13 %
Lymphs Abs: 1.4 10*3/uL (ref 0.7–4.0)
MCH: 31 pg (ref 26.0–34.0)
MCHC: 36.9 g/dL — ABNORMAL HIGH (ref 30.0–36.0)
MCV: 84.2 fL (ref 78.0–100.0)
Monocytes Absolute: 0.7 10*3/uL (ref 0.1–1.0)
Monocytes Relative: 6 %
Neutro Abs: 8.5 10*3/uL — ABNORMAL HIGH (ref 1.7–7.7)
Neutrophils Relative %: 79 %
Platelets: 190 10*3/uL (ref 150–400)
RBC: 3.35 MIL/uL — ABNORMAL LOW (ref 3.87–5.11)
RDW: 13.8 % (ref 11.5–15.5)
WBC: 10.7 10*3/uL — ABNORMAL HIGH (ref 4.0–10.5)

## 2015-12-09 LAB — COMPREHENSIVE METABOLIC PANEL
ALT: 82 U/L — ABNORMAL HIGH (ref 14–54)
AST: 46 U/L — ABNORMAL HIGH (ref 15–41)
Albumin: 3.4 g/dL — ABNORMAL LOW (ref 3.5–5.0)
Alkaline Phosphatase: 142 U/L — ABNORMAL HIGH (ref 38–126)
Anion gap: 6 (ref 5–15)
BUN: 10 mg/dL (ref 6–20)
CO2: 27 mmol/L (ref 22–32)
Calcium: 8.6 mg/dL — ABNORMAL LOW (ref 8.9–10.3)
Chloride: 103 mmol/L (ref 101–111)
Creatinine, Ser: 0.6 mg/dL (ref 0.44–1.00)
GFR calc Af Amer: >60 mL/min (ref >60)
GFR calc non Af Amer: >60 mL/min (ref >60)
Glucose, Bld: 101 mg/dL — ABNORMAL HIGH (ref 65–99)
Potassium: 3.4 mmol/L — ABNORMAL LOW (ref 3.5–5.1)
Sodium: 136 mmol/L (ref 135–145)
Total Bilirubin: 4 mg/dL — ABNORMAL HIGH (ref 0.3–1.2)
Total Protein: 6.4 g/dL — ABNORMAL LOW (ref 6.5–8.1)

## 2015-12-09 LAB — LIPASE, BLOOD: Lipase: 51 U/L (ref 11–51)

## 2015-12-09 MED ORDER — AMLODIPINE BESYLATE 10 MG PO TABS
10.0000 mg | ORAL_TABLET | Freq: Every day | ORAL | Status: DC
  Administered 2015-12-09: 10 mg via ORAL
  Filled 2015-12-09: qty 1

## 2015-12-09 MED ORDER — CARVEDILOL 6.25 MG PO TABS: 6.2500 mg | ORAL_TABLET | Freq: Two times a day (BID) | ORAL | Status: DC

## 2015-12-09 MED ORDER — CIPROFLOXACIN HCL 500 MG PO TABS: 500.0000 mg | ORAL_TABLET | Freq: Two times a day (BID) | ORAL | Status: AC

## 2015-12-09 MED ORDER — HYDRALAZINE HCL 20 MG/ML IJ SOLN
10.0000 mg | Freq: Once | INTRAMUSCULAR | Status: AC
  Administered 2015-12-09: 10 mg via INTRAVENOUS
  Filled 2015-12-09: qty 1

## 2015-12-09 MED ORDER — LISINOPRIL 20 MG PO TABS: 20.0000 mg | ORAL_TABLET | Freq: Every day | ORAL | Status: DC

## 2015-12-09 MED ORDER — AMLODIPINE BESYLATE 10 MG PO TABS: 10.0000 mg | ORAL_TABLET | Freq: Every day | ORAL | Status: DC

## 2015-12-09 MED ORDER — METRONIDAZOLE 500 MG PO TABS: 500.0000 mg | ORAL_TABLET | Freq: Three times a day (TID) | ORAL | Status: AC

## 2015-12-09 NOTE — Progress Notes (Signed)
[facility] Office:  [phone] General Surgery Progress Note   LOS: 4 days  POD -  2 Days Post-Op  Assessment/Plan: 1.  LAPAROSCOPIC CHOLECYSTECTOMY WITH INTRAOPERATIVE CHOLANGIOGRAM - 12/07/2015 - D. Joselito Field[facility]  For severe acute and chronic cholecystitis  T. Bili - 5.8 - 12/08/2015  Cipro/Flagyl - 6/12 >>>  She will need antibiotics a total of 7 days post op  Will leave drain.  She will seen in our office next week to remove the drain.  (12/12/2015, 2:00 PM)  Her AM labs are not back, but she is ready to go home from my standpoint.   2.  ERCP - 12/06/2015 - D. Jacobs  Gallstone pancreatitis.  Stone cleared.  3.  Cirrhosis - though this looks fairly mild 4.  HTN 5.  Anemia - Hgb - 9.9 - 12/08/2015 5.  DVT prophylaxis - Will start chemoprophylaxis with SQ Heparin   Active Problems:   Gallstone pancreatitis   Acute pancreatitis   Choledocholithiasis   Acute gallstone pancreatitis   Subjective:  Doing good and looks good.  Objective:   Filed Vitals:   12/09/15 0512 12/09/15 0538  BP: 187/81 184/80  Pulse: 58   Temp: 98.4 F (36.9 C)   Resp: 20      Intake/Output from previous day:  06/15 0701 - 06/16 0700 In: 1860 [P.O.:1160; IV Piggyback:700] Out: 145 [Drains:145]  Intake/Output this shift:      Physical Exam:   General: Mildly obese F who is alert and oriented.    HEENT: Normal. Pupils equal. .   Lungs: Clear   Abdomen: Soft.  BS present   Wound: Incisions all look good.  Drain in RUQ - 145 cc recorded last 24 hours   Lab Results:     Recent Labs  12/07/15 0448  WBC 8.7  HGB 9.9*  HCT 26.9*  PLT 133*    BMET    Recent Labs  12/07/15 0448 12/08/15 0314  NA 137 134*  K 3.8 4.2  CL 104 102  CO2 27 27  GLUCOSE 99 148*  BUN 11 9  CREATININE 0.49 0.33*  CALCIUM 8.5* 8.2*    PT/INR    Recent Labs  12/06/15 1759  LABPROT 14.6  INR 1.17    ABG  No results for input(s): PHART, HCO3 in the last 72 hours.  Invalid input(s):  PCO2, PO2   Studies/Results:  Dg Cholangiogram Operative  12/07/2015  CLINICAL DATA:  Cholelithiasis EXAM: INTRAOPERATIVE CHOLANGIOGRAM TECHNIQUE: Cholangiographic images from the C-arm fluoroscopic device were submitted for interpretation post-operatively. Please see the procedural report for the amount of contrast and the fluoroscopy time utilized. COMPARISON:  Endoscopy 12/06/2015 FINDINGS: No persistent filling defects in the common duct. Intrahepatic ducts are incompletely visualized, appearing decompressed centrally. Contrast passes into the duodenum. : Negative for retained common duct stone. Electronically Signed   By: Lucrezia Europe M.D.   On: 12/07/2015 19:38     Anti-infectives:   Anti-infectives    Start     Dose/Rate Route Frequency Ordered Stop   12/05/15 2200  metroNIDAZOLE (FLAGYL) IVPB 500 mg     500 mg 100 mL/hr over 60 Minutes Intravenous Every 8 hours 12/05/15 2021     12/05/15 1715  ciprofloxacin (CIPRO) IVPB 400 mg     400 mg 200 mL/hr over 60 Minutes Intravenous Every 12 hours 12/05/15 1705        Alphonsa Overall, MD, FACS Pager: [facility] Office: [phone] 12/09/2015

## 2015-12-09 NOTE — Discharge Summary (Signed)
Physician Discharge Summary  Patient ID: [name] MRN: [id number] DOB/AGE: [birth year] 65 y.o.  Admit date: 12/05/2015 Discharge date: 12/09/2015  Admission Diagnoses:  Discharge Diagnoses:  Active Problems:   Gallstone pancreatitis   Acute pancreatitis   Choledocholithiasis   Acute gallstone pancreatitis Hypertension Hypokalemia.  Discharged Condition: stable  Hospital Course: 65 year old female with history of hypertension, documented liver cirrhosis and hypothyroidism. Patient initially presented to the ER 3 days prior to re presentation with abdominal pain, severe nausea and vomiting, abnormal LFT's and severe jaundice but refused to be admitted. Patient continued to have abdominal pain, nausea and vomiting at home and decided to represent today. CT scan done reveal obstructive picture with gall bladder disease. Lipase was significantly elevated on presentation, bilirubin was elevated, and peaked at 12, and other LFT's were abnormal. Potassium was 3.1, but patient was on lisinopril/HCTZ on presentation.  Patient was admitted for further assessment and management of gallstone pancreatitis. Patient was kept NPO, managed supportively and GI and Surgery team consulted. Patient underwent ERCP and eventually had cholecystectomy 2 days ago. Patient has improved significantly. Lipase is down to normal. Bilirubin is on the downward trend, and was 5.8 today. Patient is eager to be discharged back home today. Patient is to follow up in Surgeon's office in 3 days time.   Consults: GI and general surgery  Significant Diagnostic Studies: ERCP  Discharge Medication - Please see discharge Med Rec.  Discharge Exam: Blood pressure 184/80, pulse 58, temperature 98.4 F (36.9 C), temperature source Oral, resp. rate 20, height 5\' 4"  (1.626 m), weight 78.926 kg (174 lb), SpO2 94 %.   Disposition: 01-Home or Self Care  Discharge Instructions    Call MD for:    Complete by:  As directed    Please call MD if symptoms worsen     Diet - low sodium heart healthy    Complete by:  As directed      Discharge instructions    Complete by:  As directed   Follow up with PCP and General surgery.     Increase activity slowly    Complete by:  As directed             Medication List    STOP taking these medications        diphenhydrAMINE 25 mg capsule  Commonly known as:  BENADRYL     FLAX SEEDS PO     lisinopril-hydrochlorothiazide 20-12.5 MG tablet  Commonly known as:  PRINZIDE,ZESTORETIC     ondansetron 4 MG tablet  Commonly known as:  ZOFRAN      TAKE these medications        amLODipine 10 MG tablet  Commonly known as:  NORVASC  Take 1 tablet (10 mg total) by mouth daily.     calcium-vitamin D 500-200 MG-UNIT tablet  Commonly known as:  OSCAL WITH D  Take 1 tablet by mouth daily.     carvedilol 6.25 MG tablet  Commonly known as:  COREG  Take 1 tablet (6.25 mg total) by mouth 2 (two) times daily with a meal.     ciprofloxacin 500 MG tablet  Commonly known as:  CIPRO  Take 1 tablet (500 mg total) by mouth 2 (two) times daily.     citalopram 20 MG tablet  Commonly known as:  CELEXA  TAKE 1 TABLET BY MOUTH EVERY DAY.     Fish Oil 1000 MG Caps  Take 1 capsule by mouth daily.  folic acid A999333 MCG tablet  Commonly known as:  FOLVITE  Take 400 mcg by mouth daily.     HYDROcodone-acetaminophen 5-325 MG tablet  Commonly known as:  NORCO/VICODIN  Take 1 tablet by mouth every 4 (four) hours as needed.     levothyroxine 125 MCG tablet  Commonly known as:  SYNTHROID, LEVOTHROID  Take 125 mcg by mouth daily before breakfast.     lisinopril 20 MG tablet  Commonly known as:  PRINIVIL,ZESTRIL  Take 1 tablet (20 mg total) by mouth daily.     metroNIDAZOLE 500 MG tablet  Commonly known as:  FLAGYL  Take 1 tablet (500 mg total) by mouth 3 (three) times daily.     MULTIVITAMIN & MINERAL PO  Take 1 tablet by mouth daily.     pantoprazole 20 MG tablet   Commonly known as:  PROTONIX  Take 1 tablet (20 mg total) by mouth daily.     VITAMIN B12 PO  Take 1 tablet by mouth daily.     vitamin E 400 UNIT capsule  Take 400 Units by mouth daily.           Follow-up Information    Follow up with [facility] On 12/21/2015.   Specialty:  General Surgery   Why:  Your appointment is at 10:30 AM, be at the office 30 minutes early for check in.   Contact information:   [address] [place] [address] [phone]       Follow up with [facility] On 12/12/2015.   Specialty:  General Surgery   Why:  your appointment to have the drain removed is at 2 PM.  Be at the office 30 minutes early for check in.   Contact information:   [address] [place] [address] [phone]       Follow up with Vance Gather, MD In 1 week.   Specialty:  Family Medicine   Why:  Follow up with PCP and Surgery team   Contact information:   [address] [place] [address] [phone]       Signed: Bonnell Public 12/09/2015, 10:23 AM

## 2016-05-02 ENCOUNTER — Encounter: Payer: Self-pay | Admitting: Gastroenterology

## 2016-06-27 DIAGNOSIS — Z1211 Encounter for screening for malignant neoplasm of colon: Secondary | ICD-10-CM | POA: Diagnosis not present

## 2016-06-27 DIAGNOSIS — Z1212 Encounter for screening for malignant neoplasm of rectum: Secondary | ICD-10-CM | POA: Diagnosis not present

## 2016-11-29 DIAGNOSIS — I129 Hypertensive chronic kidney disease with stage 1 through stage 4 chronic kidney disease, or unspecified chronic kidney disease: Secondary | ICD-10-CM | POA: Diagnosis not present

## 2016-11-29 DIAGNOSIS — E538 Deficiency of other specified B group vitamins: Secondary | ICD-10-CM | POA: Diagnosis not present

## 2016-11-29 DIAGNOSIS — E039 Hypothyroidism, unspecified: Secondary | ICD-10-CM | POA: Diagnosis not present

## 2016-11-29 DIAGNOSIS — N390 Urinary tract infection, site not specified: Secondary | ICD-10-CM | POA: Diagnosis not present

## 2016-12-05 DIAGNOSIS — R319 Hematuria, unspecified: Secondary | ICD-10-CM | POA: Diagnosis not present

## 2016-12-05 DIAGNOSIS — E039 Hypothyroidism, unspecified: Secondary | ICD-10-CM | POA: Diagnosis not present

## 2016-12-05 DIAGNOSIS — I10 Essential (primary) hypertension: Secondary | ICD-10-CM | POA: Diagnosis not present

## 2016-12-05 DIAGNOSIS — R69 Illness, unspecified: Secondary | ICD-10-CM | POA: Diagnosis not present

## 2016-12-19 DIAGNOSIS — I10 Essential (primary) hypertension: Secondary | ICD-10-CM | POA: Diagnosis not present

## 2016-12-19 DIAGNOSIS — E039 Hypothyroidism, unspecified: Secondary | ICD-10-CM | POA: Diagnosis not present

## 2016-12-31 DIAGNOSIS — N390 Urinary tract infection, site not specified: Secondary | ICD-10-CM | POA: Diagnosis not present

## 2016-12-31 DIAGNOSIS — R319 Hematuria, unspecified: Secondary | ICD-10-CM | POA: Diagnosis not present

## 2017-01-09 DIAGNOSIS — I10 Essential (primary) hypertension: Secondary | ICD-10-CM | POA: Diagnosis not present

## 2017-02-04 DIAGNOSIS — E039 Hypothyroidism, unspecified: Secondary | ICD-10-CM | POA: Diagnosis not present

## 2017-04-26 ENCOUNTER — Other Ambulatory Visit: Payer: Self-pay | Admitting: Internal Medicine

## 2017-04-26 DIAGNOSIS — Z1239 Encounter for other screening for malignant neoplasm of breast: Secondary | ICD-10-CM

## 2017-04-29 ENCOUNTER — Ambulatory Visit
Admission: RE | Admit: 2017-04-29 | Discharge: 2017-04-29 | Disposition: A | Discharge status: Home / Self Care | Payer: Medicare Other | Source: Ambulatory Visit | Attending: Internal Medicine | Admitting: Internal Medicine

## 2017-04-29 DIAGNOSIS — Z1231 Encounter for screening mammogram for malignant neoplasm of breast: Secondary | ICD-10-CM | POA: Diagnosis not present

## 2017-04-29 DIAGNOSIS — Z1239 Encounter for other screening for malignant neoplasm of breast: Secondary | ICD-10-CM

## 2017-04-29 IMAGING — MG 2D DIGITAL SCREENING BILATERAL MAMMOGRAM WITH CAD AND ADJUNCT TO
9 of 13 series · 9 of 29 positions shown · non-contrast
Comparison: Previous exam(s).

CLINICAL DATA: Screening.

EXAM:
2D DIGITAL SCREENING BILATERAL MAMMOGRAM WITH CAD AND ADJUNCT TOMO

[R MLO (1 of 2)]
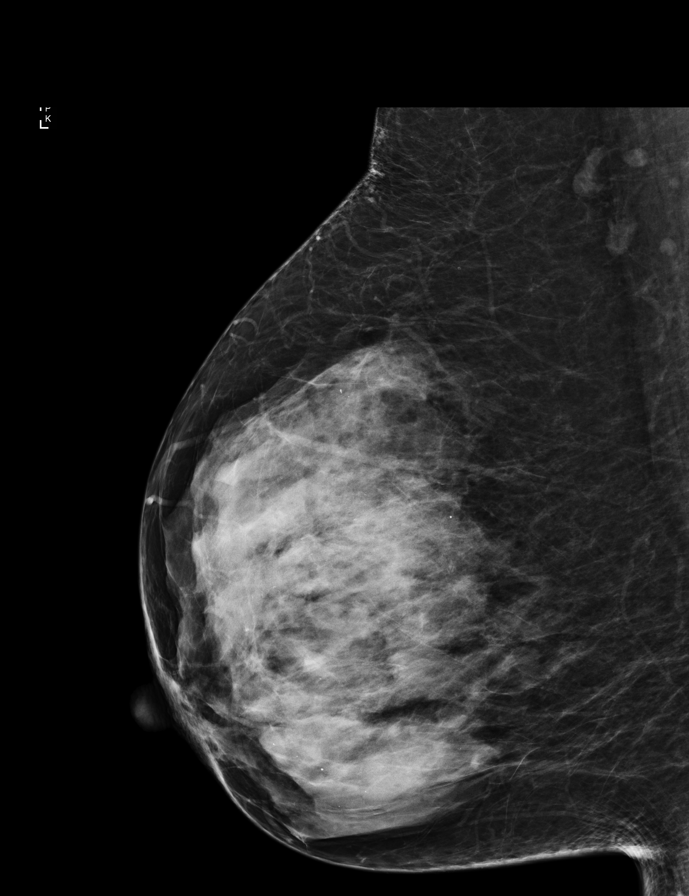

[L MLO synth-2D]
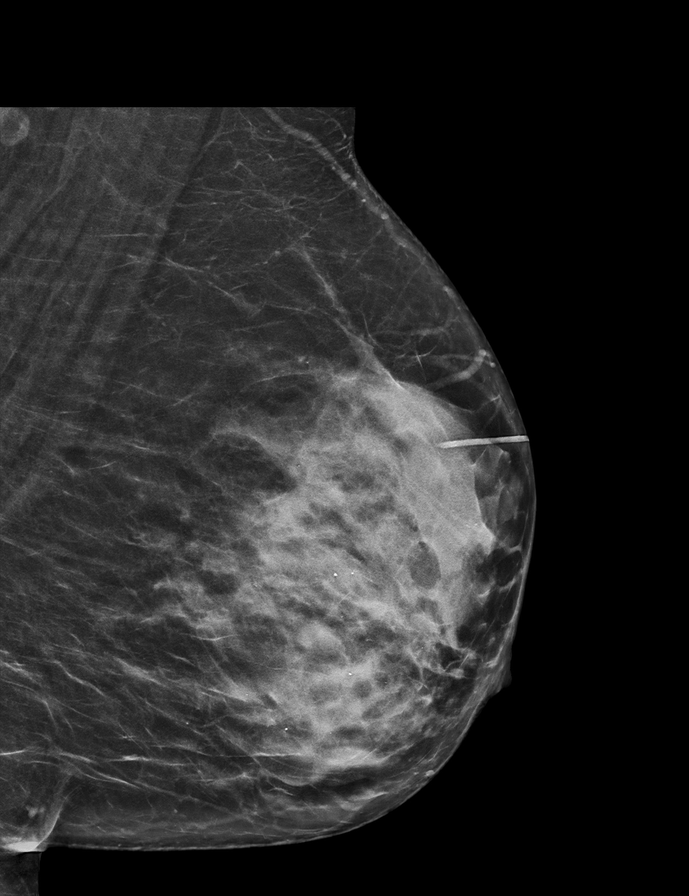

[R CC]
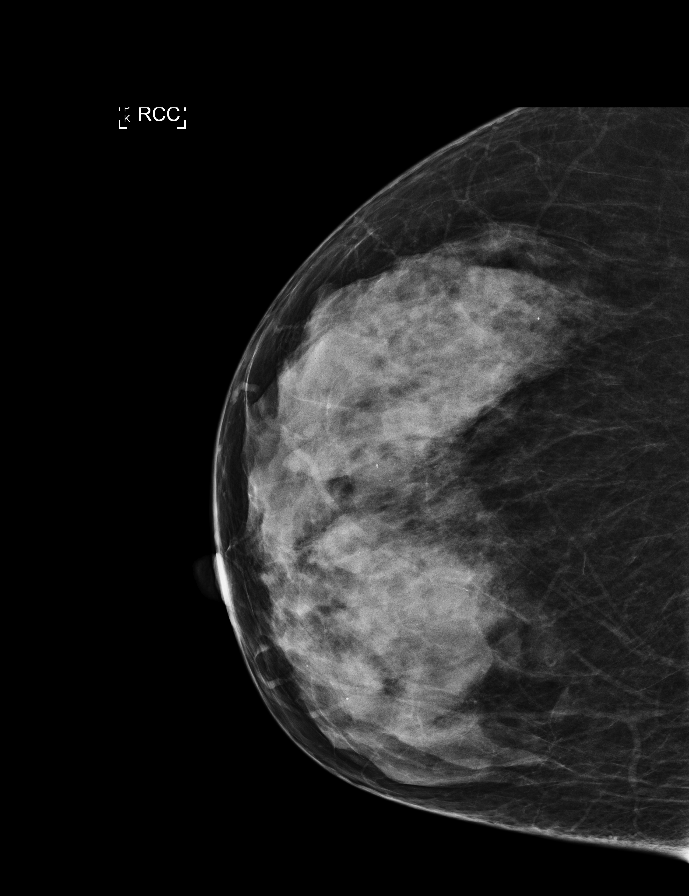

[R CC synth-2D]
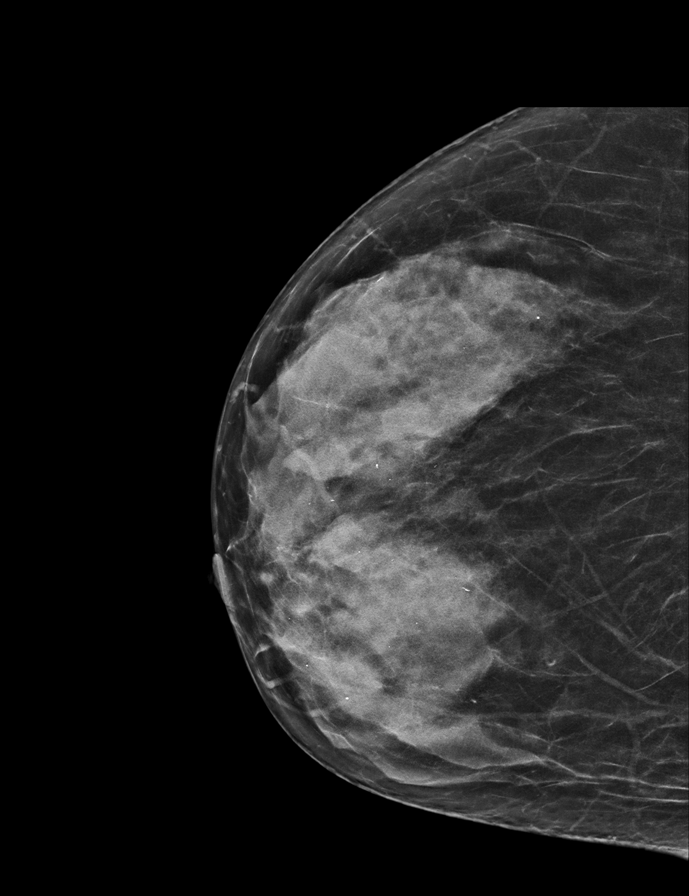

[R MLO synth-2D]
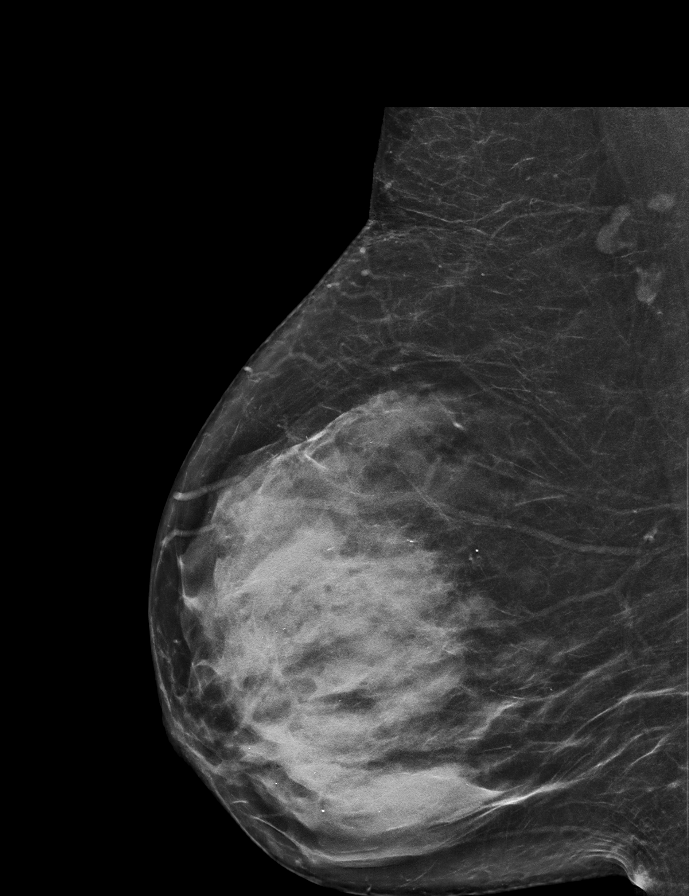

[L MLO]
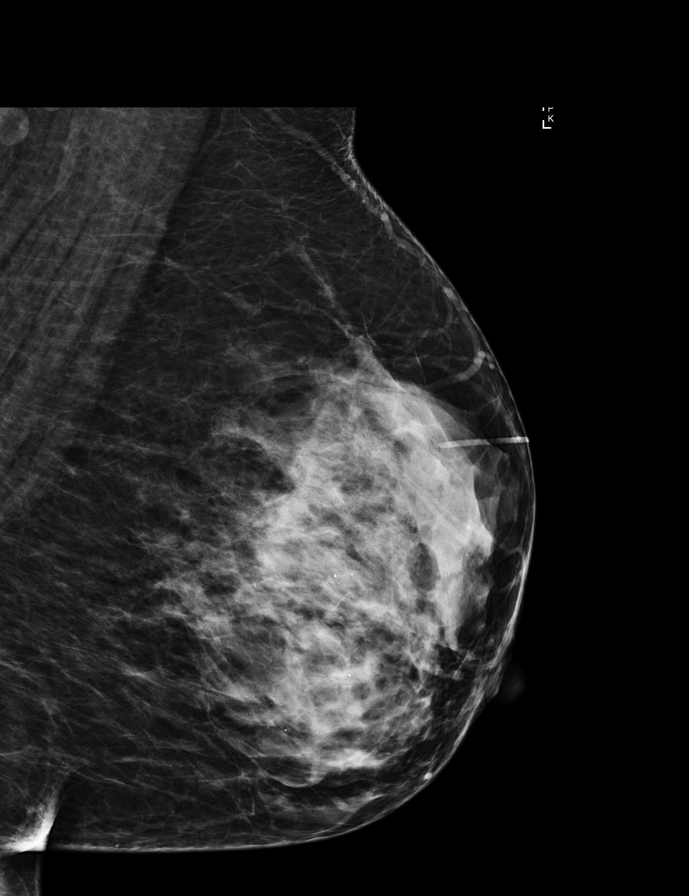

[L CC synth-2D]
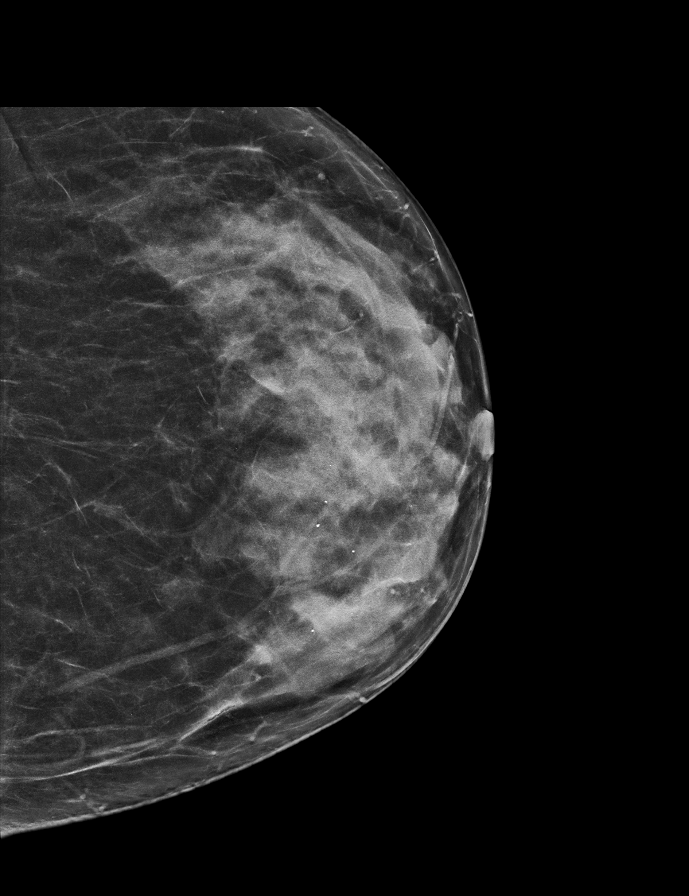

[R MLO (2 of 2)]
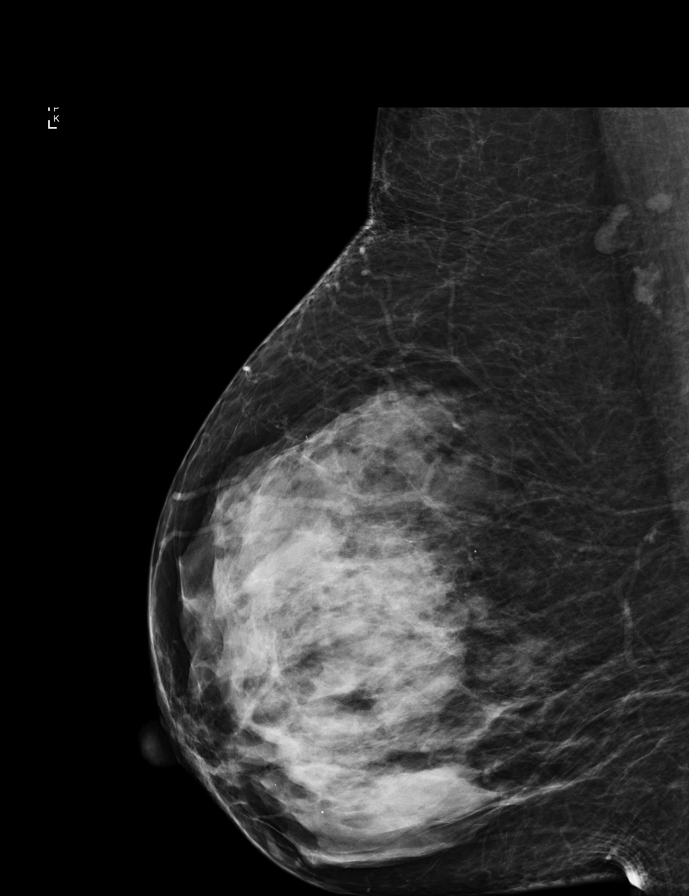

[L CC]
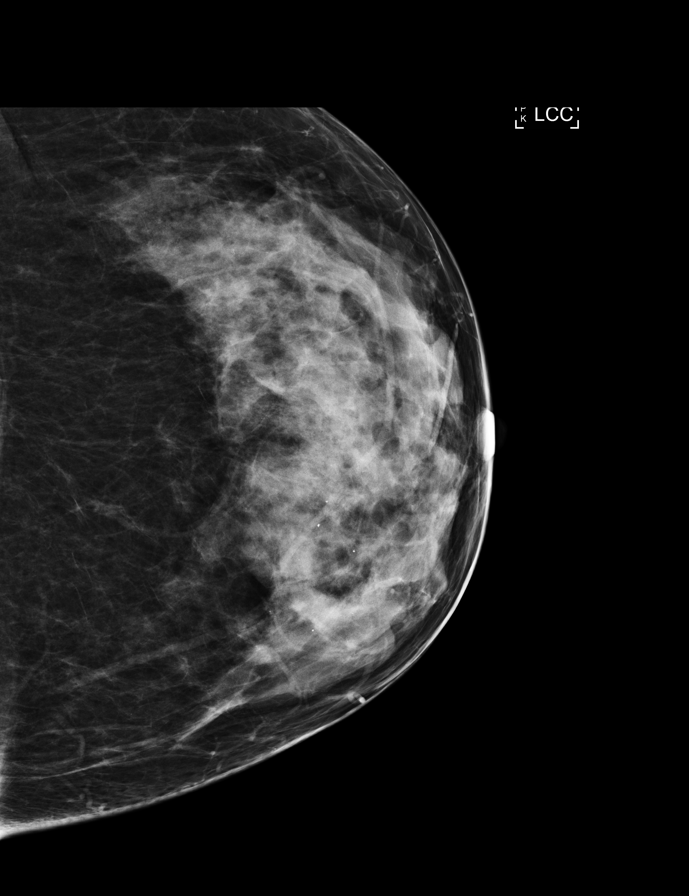

[9 of 29 positions shown; findings below may reference images not displayed]

ACR Breast Density Category c: The breast tissue is heterogeneously
dense, which may obscure small masses.
FINDINGS: In the right breast, a possible asymmetry warrants further
evaluation. In the left breast, no findings suspicious for
malignancy. Images were processed with CAD.
IMPRESSION: Further evaluation is suggested for possible asymmetry in the right
breast.

RECOMMENDATION:
Diagnostic mammogram and possibly ultrasound of the right breast.
(Code:[IX])

The patient will be contacted regarding the findings, and additional
imaging will be scheduled.

BI-RADS CATEGORY  0: Incomplete. Need additional imaging evaluation
and/or prior mammograms for comparison.

## 2017-04-30 ENCOUNTER — Other Ambulatory Visit: Payer: Self-pay | Admitting: Internal Medicine

## 2017-04-30 DIAGNOSIS — R928 Other abnormal and inconclusive findings on diagnostic imaging of breast: Secondary | ICD-10-CM

## 2017-05-03 DIAGNOSIS — R69 Illness, unspecified: Secondary | ICD-10-CM | POA: Diagnosis not present

## 2017-05-07 ENCOUNTER — Other Ambulatory Visit: Payer: Self-pay | Admitting: Internal Medicine

## 2017-05-07 ENCOUNTER — Ambulatory Visit
Admission: RE | Admit: 2017-05-07 | Discharge: 2017-05-07 | Disposition: A | Discharge status: Home / Self Care | Payer: Medicare HMO | Source: Ambulatory Visit | Attending: Internal Medicine | Admitting: Internal Medicine

## 2017-05-07 DIAGNOSIS — R928 Other abnormal and inconclusive findings on diagnostic imaging of breast: Secondary | ICD-10-CM

## 2017-05-07 DIAGNOSIS — N6313 Unspecified lump in the right breast, lower outer quadrant: Secondary | ICD-10-CM | POA: Diagnosis not present

## 2017-05-07 DIAGNOSIS — R922 Inconclusive mammogram: Secondary | ICD-10-CM | POA: Diagnosis not present

## 2017-05-07 DIAGNOSIS — N6310 Unspecified lump in the right breast, unspecified quadrant: Secondary | ICD-10-CM

## 2017-05-07 IMAGING — MG 2D DIGITAL DIAGNOSTIC UNILATERAL RIGHT MAMMOGRAM WITH CAD AND AD
3 series · 3 of 7 positions shown · non-contrast
Comparison: Previous exam(s).

CLINICAL DATA: Screening recall for possible asymmetry in the right
breast.

EXAM:
2D DIGITAL DIAGNOSTIC RIGHT MAMMOGRAM WITH CAD AND ADJUNCT TOMO
ULTRASOUND RIGHT BREAST

[R MLO synth-2D]
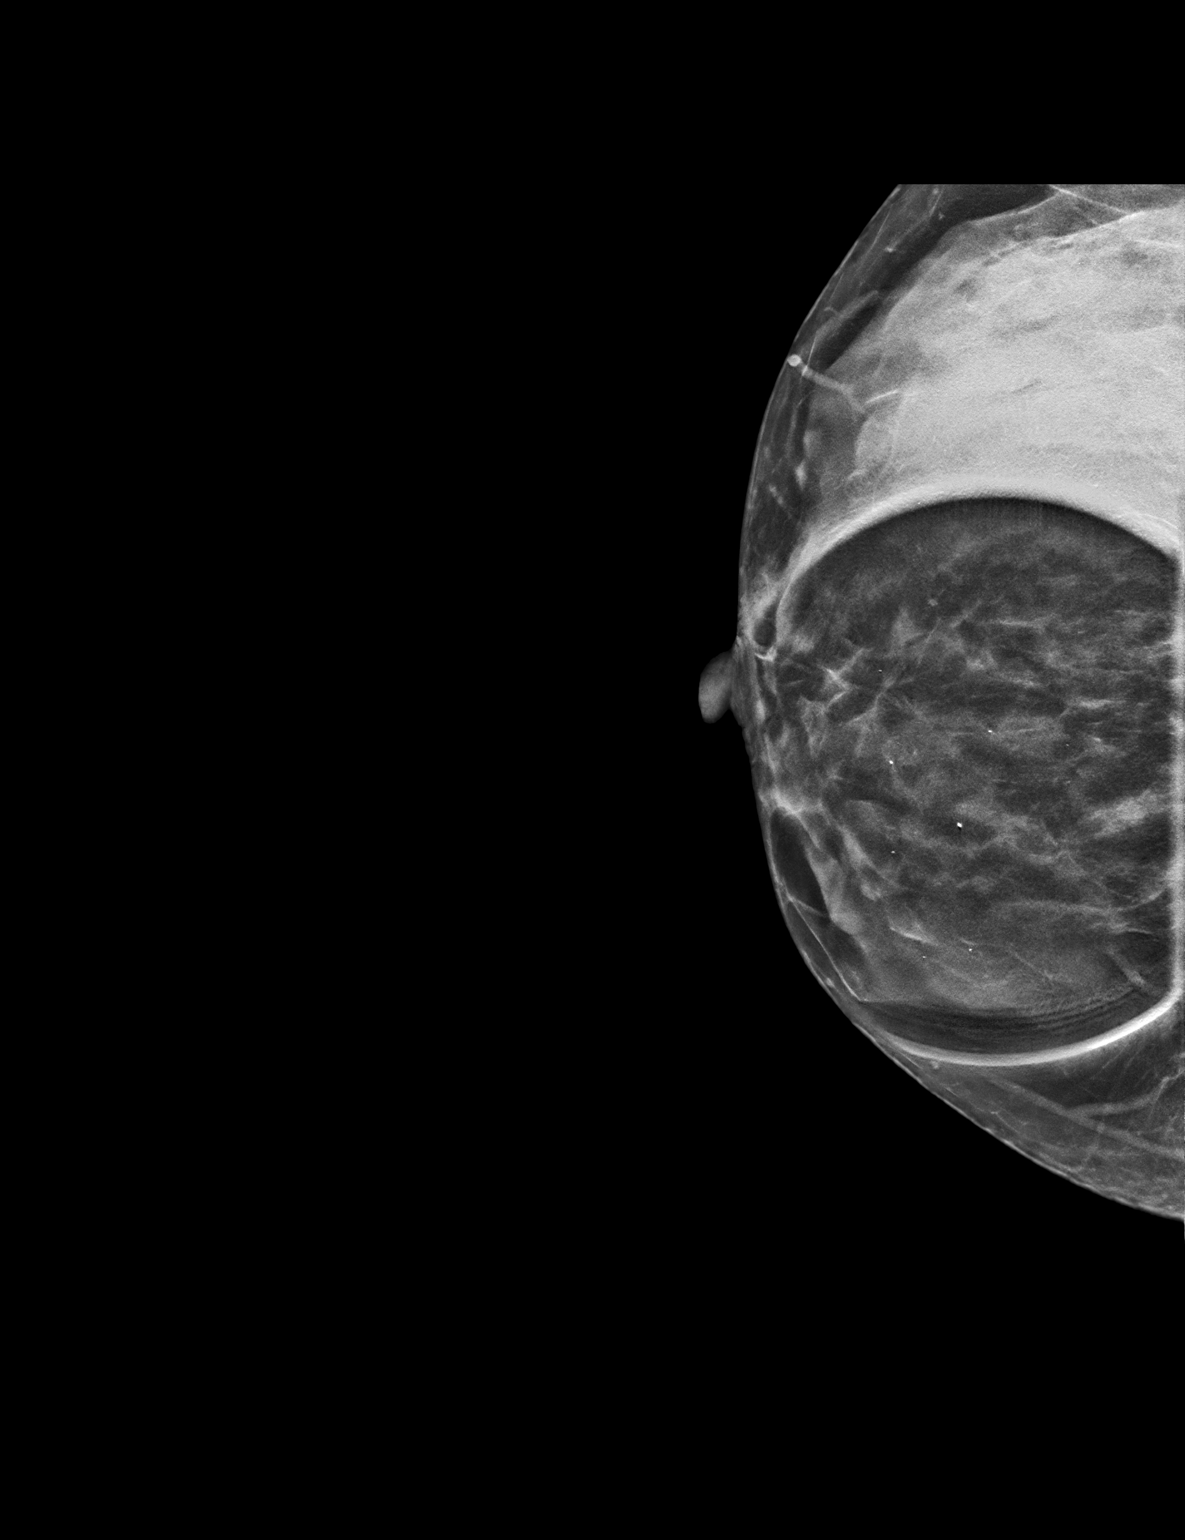

[R CC]
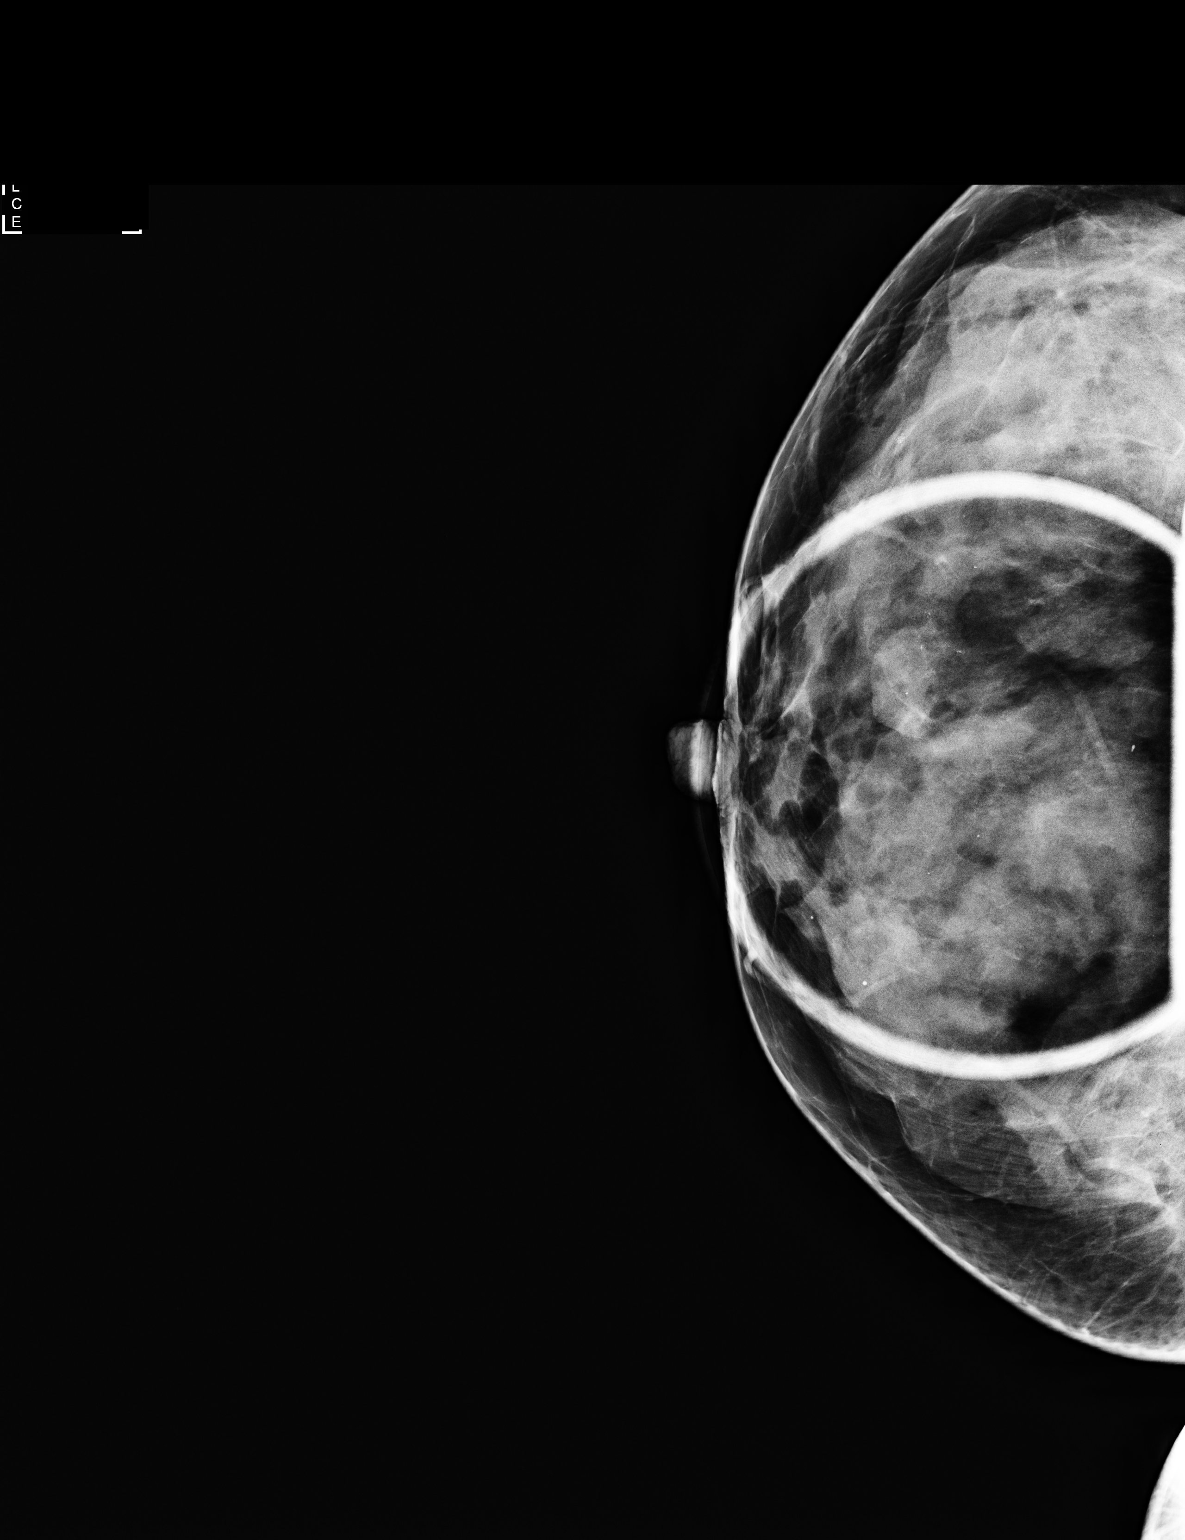

[R MLO tomo · tomo slice 25/48.0]
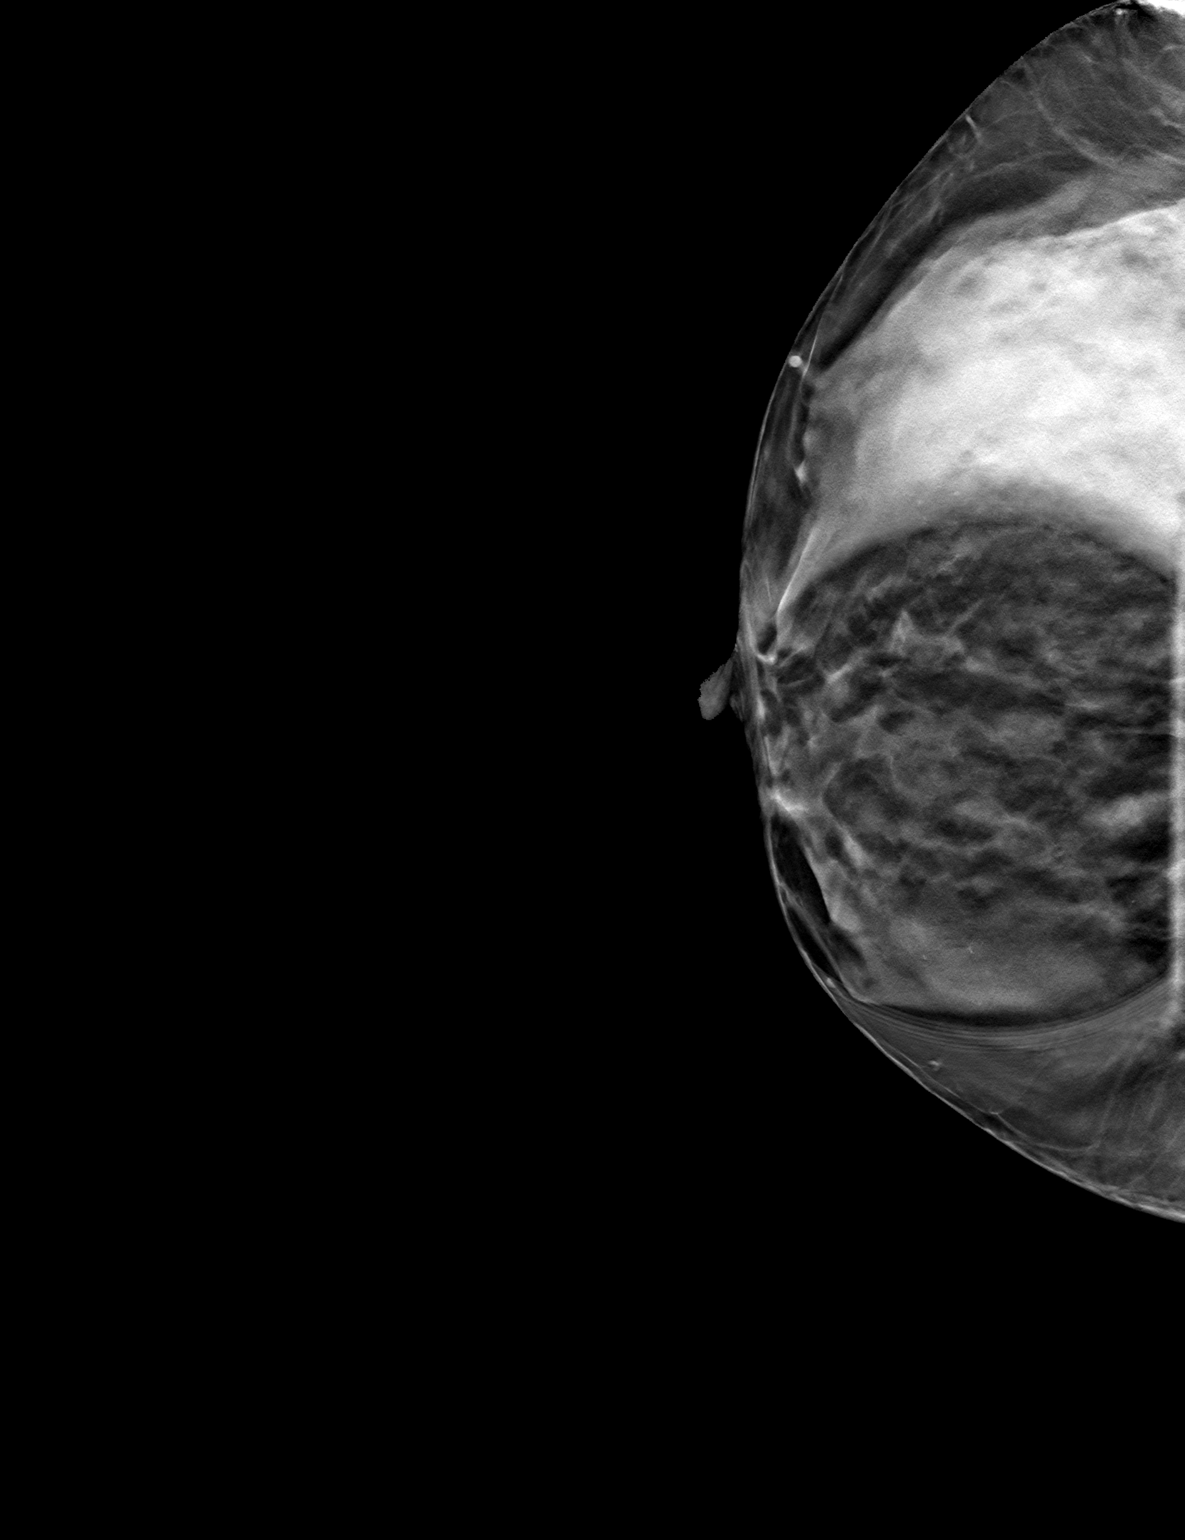

[3 of 7 positions shown; findings below may reference images not displayed]

ACR Breast Density Category d: The breast tissue is extremely dense,
which lowers the sensitivity of mammography.
FINDINGS: On spot-compression imaging, there is a possible asymmetry persists.
It appears there is a partly circumscribed smooth oval mass in the
inferior breast, anterior [DATE], at approximately 7 o'clock. This may
have been present on prior studies, obscured by the dense
fibroglandular tissue, of visualized on the current exam due to the
tomosynthesis images.

No other evidence of a mass or significant asymmetry. There is no
architectural distortion and there are no suspicious calcifications.

Mammographic images were processed with CAD.

On physical exam, no discrete mass is palpated in the inferior right
breast.

Targeted ultrasound is performed, showing a hypoechoic circumscribed
relatively superficial mass in the right breast at 7 o'clock, 1 cm
from the nipple, measuring 13 x 4 x 8 mm. Mass is oriented parallel
to the skin.
IMPRESSION: 1. Probably benign mass, most likely a fibroadenoma, in the right
breast corresponds to the area of asymmetry noted on the current
screening study. I suspect that this mass was present previously,
but obscured on the prior mammograms by the dense fibroglandular
tissue. Short-term follow-up is recommended.

RECOMMENDATION:
Repeat right breast ultrasound in 6 months.

I have discussed the findings and recommendations with the patient.
Results were also provided in writing at the conclusion of the
visit. If applicable, a reminder letter will be sent to the patient
regarding the next appointment.

BI-RADS CATEGORY  3: Probably benign.

## 2017-05-22 ENCOUNTER — Encounter (HOSPITAL_COMMUNITY): Payer: Self-pay | Admitting: Family Medicine

## 2017-05-22 ENCOUNTER — Emergency Department (HOSPITAL_COMMUNITY): Payer: Medicare HMO

## 2017-05-22 ENCOUNTER — Emergency Department (HOSPITAL_COMMUNITY)
Admission: EM | Admit: 2017-05-22 | Discharge: 2017-05-22 | Disposition: A | Discharge status: Home / Self Care | Payer: Medicare HMO | Attending: Emergency Medicine | Admitting: Emergency Medicine

## 2017-05-22 DIAGNOSIS — M25462 Effusion, left knee: Secondary | ICD-10-CM | POA: Diagnosis not present

## 2017-05-22 DIAGNOSIS — Y999 Unspecified external cause status: Secondary | ICD-10-CM | POA: Insufficient documentation

## 2017-05-22 DIAGNOSIS — Z79899 Other long term (current) drug therapy: Secondary | ICD-10-CM | POA: Diagnosis not present

## 2017-05-22 DIAGNOSIS — Y939 Activity, unspecified: Secondary | ICD-10-CM | POA: Diagnosis not present

## 2017-05-22 DIAGNOSIS — I10 Essential (primary) hypertension: Secondary | ICD-10-CM | POA: Diagnosis not present

## 2017-05-22 DIAGNOSIS — W010XXA Fall on same level from slipping, tripping and stumbling without subsequent striking against object, initial encounter: Secondary | ICD-10-CM | POA: Diagnosis not present

## 2017-05-22 DIAGNOSIS — S8002XA Contusion of left knee, initial encounter: Secondary | ICD-10-CM | POA: Insufficient documentation

## 2017-05-22 DIAGNOSIS — F17210 Nicotine dependence, cigarettes, uncomplicated: Secondary | ICD-10-CM | POA: Diagnosis not present

## 2017-05-22 DIAGNOSIS — E039 Hypothyroidism, unspecified: Secondary | ICD-10-CM | POA: Diagnosis not present

## 2017-05-22 DIAGNOSIS — Y92512 Supermarket, store or market as the place of occurrence of the external cause: Secondary | ICD-10-CM | POA: Diagnosis not present

## 2017-05-22 DIAGNOSIS — R69 Illness, unspecified: Secondary | ICD-10-CM | POA: Diagnosis not present

## 2017-05-22 IMAGING — CR DG KNEE COMPLETE 4+V*L*
4 series · 4 of 4 positions shown · non-contrast
Comparison: None.

CLINICAL DATA: 66 y/o  F; fall off wall marked with knee pain.

EXAM:
LEFT KNEE - COMPLETE 4+ VIEW

[t knee ap left]
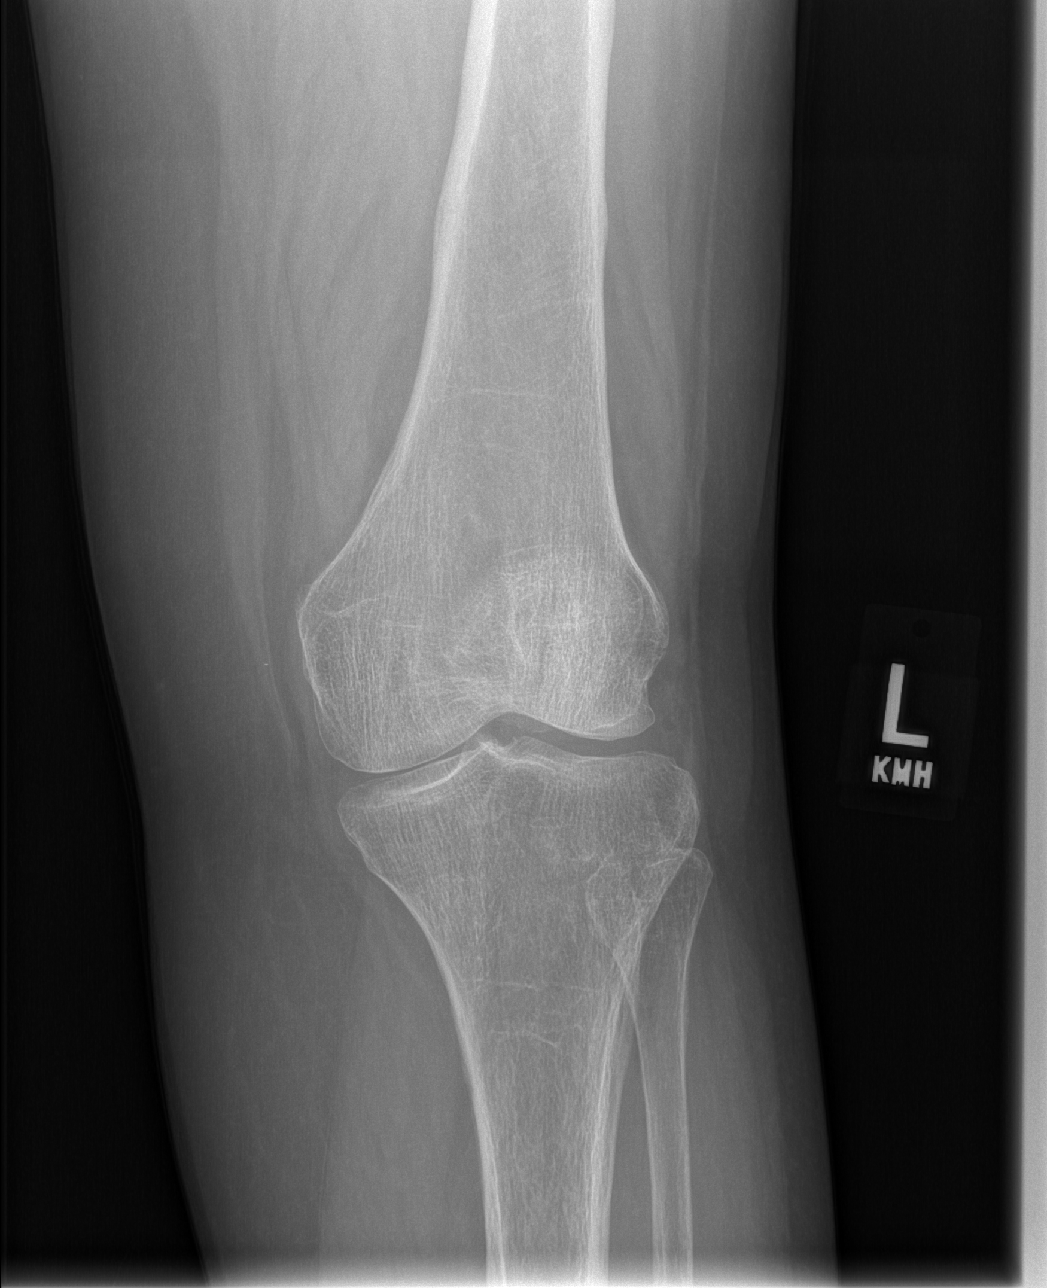

[t knee obl left (1 of 2)]
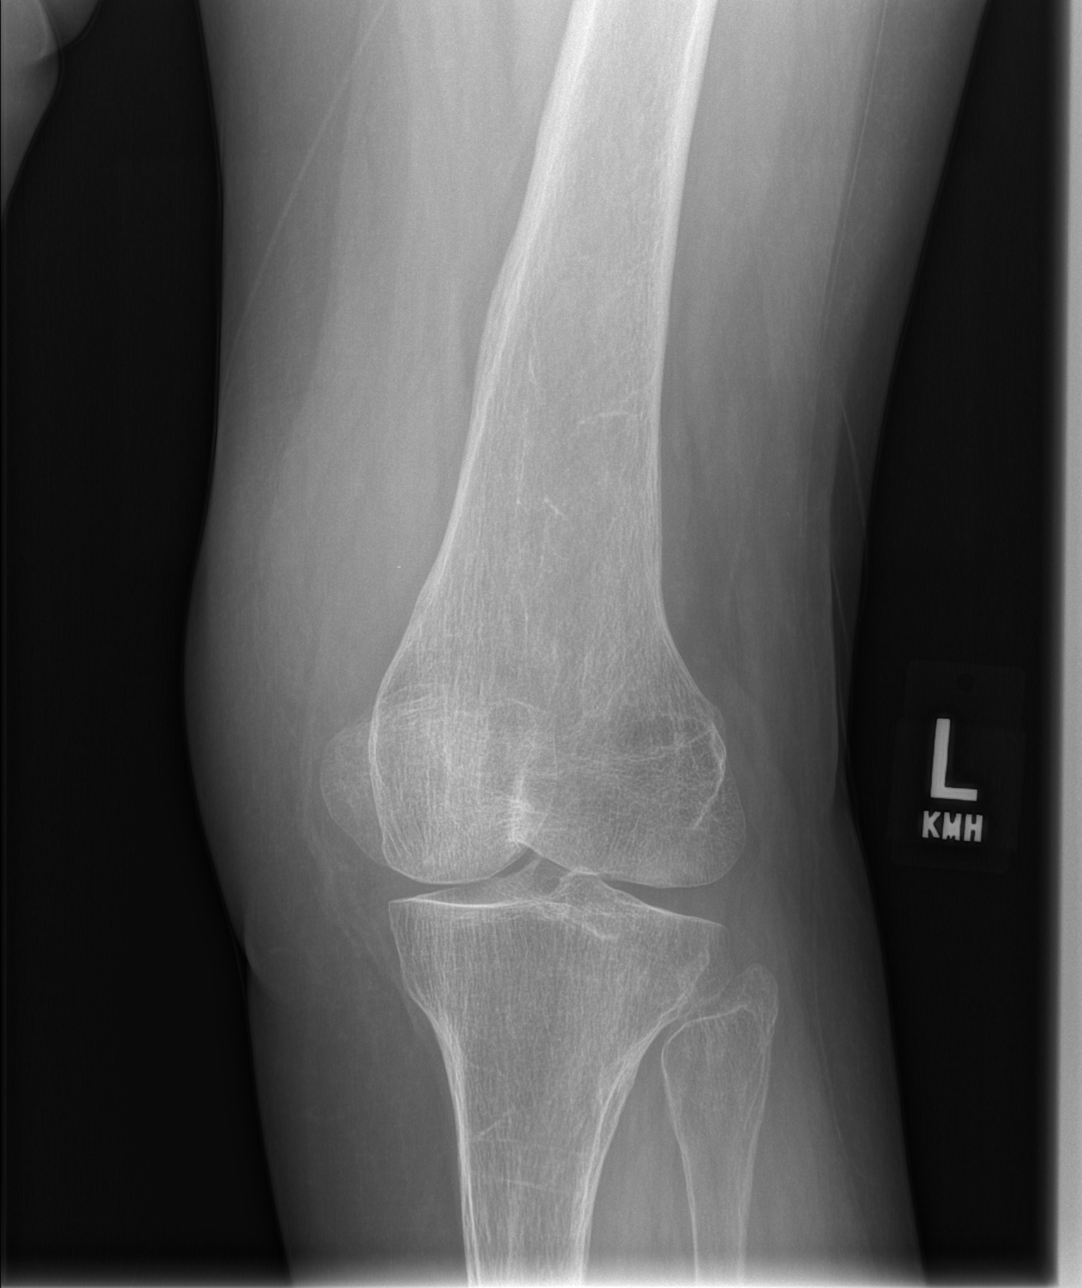

[t knee obl left (2 of 2)]
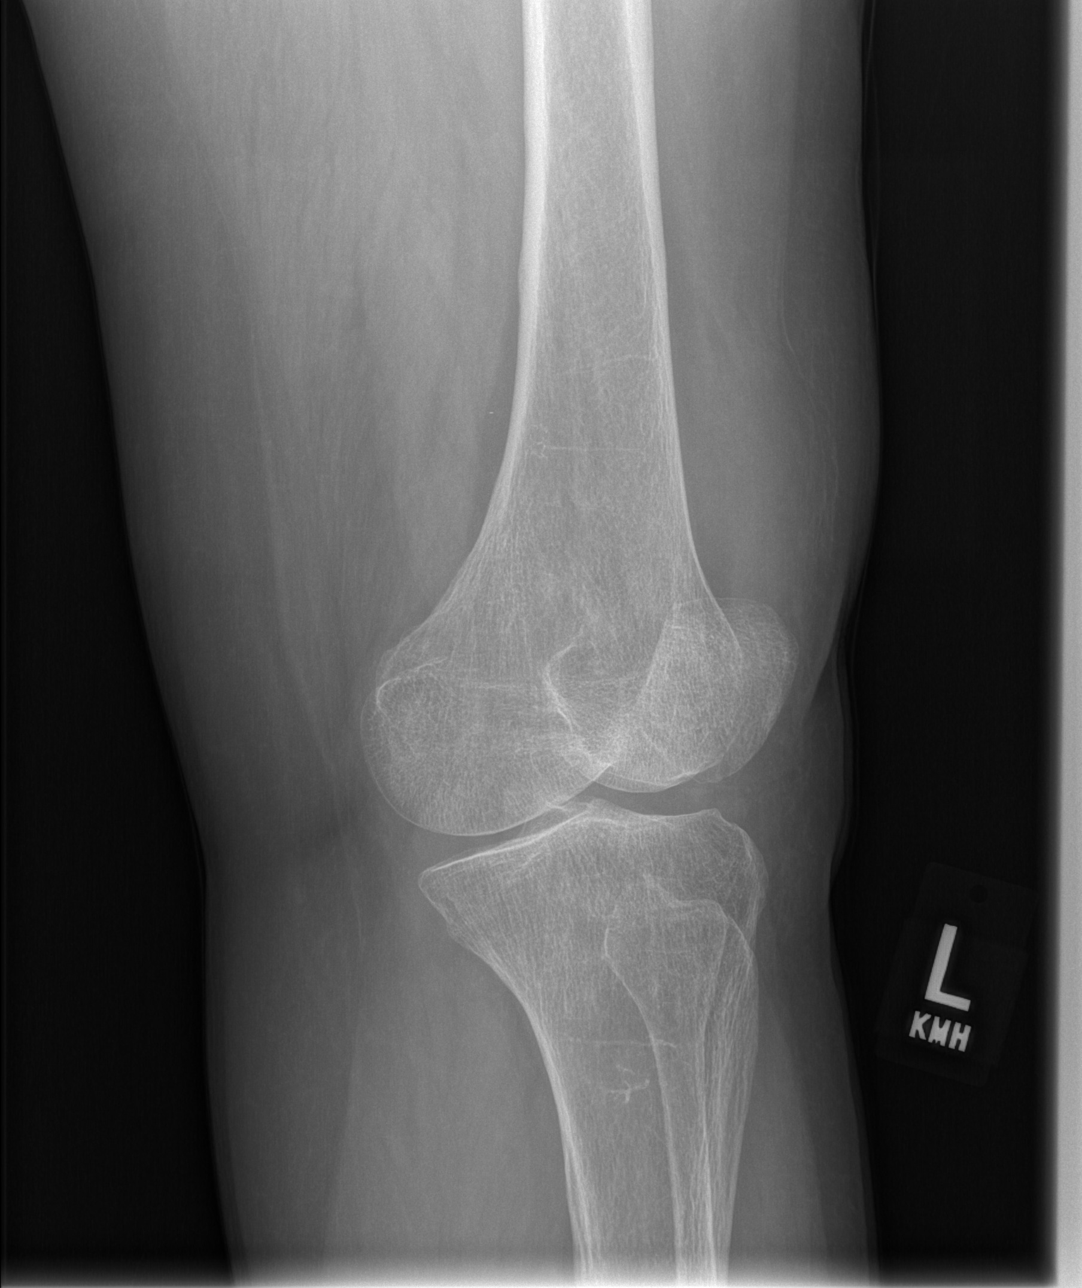

[t knee lat left]
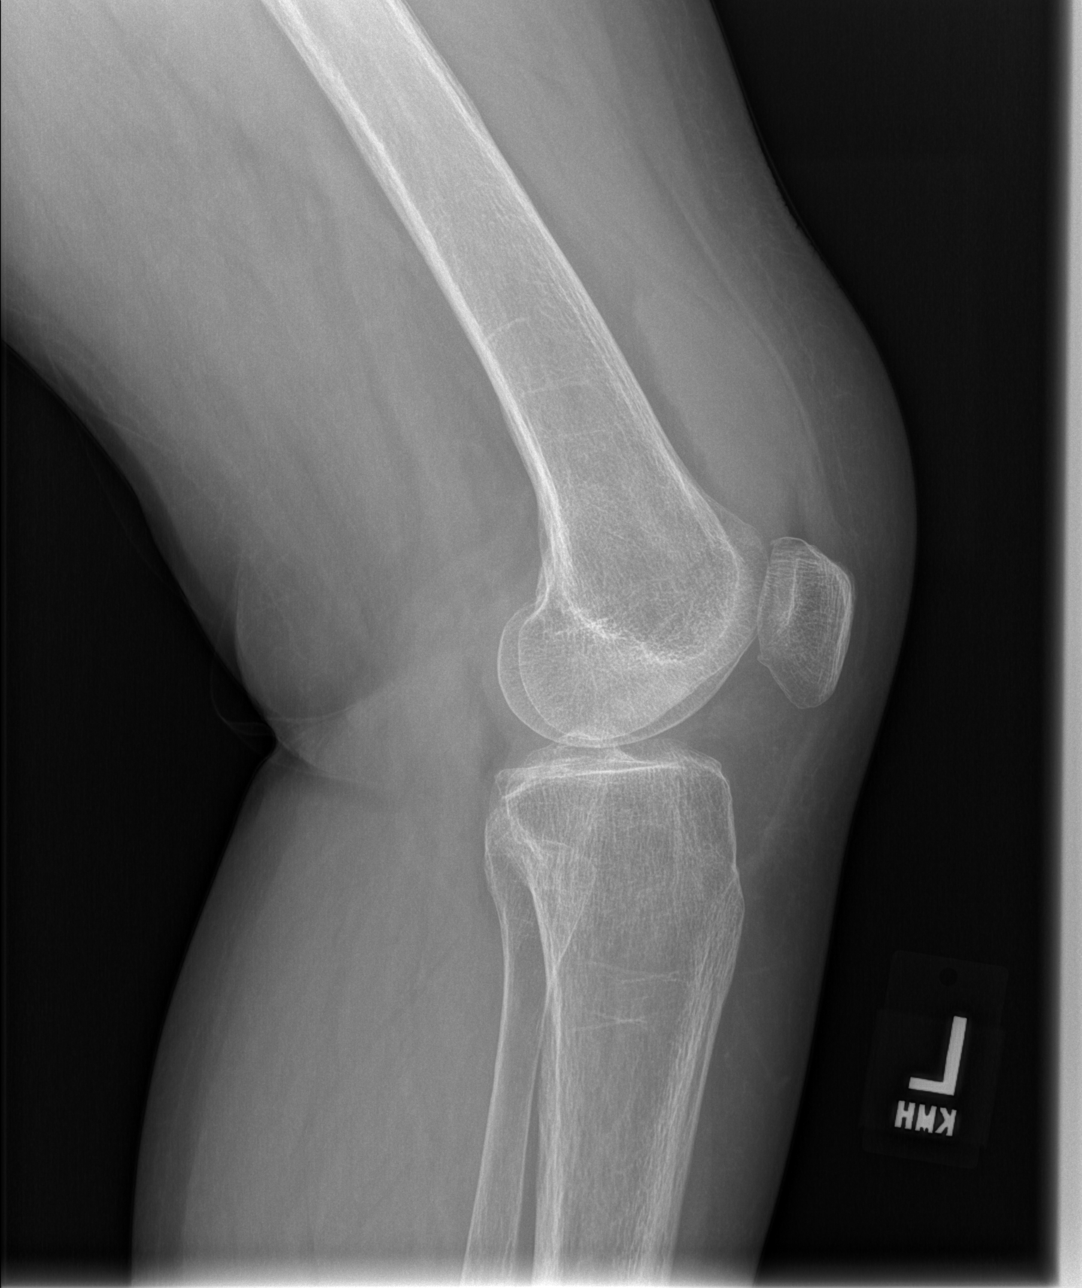

[4 of 4 positions shown; findings below may reference images not displayed]

FINDINGS: No acute fracture or dislocation identified. Moderate suprapatellar
joint effusion. Bones are demineralized. Joint spaces are well
maintained.
IMPRESSION: 1. No acute fracture or dislocation identified.
2. Moderate suprapatellar joint effusion.
3. Bones are demineralized.

By: LOREDO M.D.

## 2017-05-22 NOTE — ED Provider Notes (Signed)
[facility] DEPT Provider Note   CSN: [id number] Arrival date & time: 05/22/17  1714     History   Chief Complaint Chief Complaint  Patient presents with  . Fall    HPI [name] is a 66 y.o. female.  The history is provided by the patient. No language interpreter was used.  Fall  This is a new problem. The current episode started 3 to 5 hours ago. The problem occurs constantly. The problem has not changed since onset.Nothing aggravates the symptoms. Nothing relieves the symptoms. She has tried nothing for the symptoms. The treatment provided no relief.   Pt reports she slipped on a grape in [employer].  Pt reports she fell and hit left knee.   Past Medical History:  Diagnosis Date  . [place] 03/16/2010  . Hypertension   . Hypothyroidism   . Liver cirrhosis ([facility])   . Thyroid disease    S/p Radiation    Patient Active Problem List   Diagnosis Date Noted  . Acute gallstone pancreatitis   . Gallstone pancreatitis 12/05/2015  . Acute pancreatitis 12/05/2015  . Choledocholithiasis   . Fracture of proximal end of right humerus 07/22/2013  . Tibia/fibula fracture 07/18/2013  . Mood disorder ([facility]) 05/19/2013  . Undiagnosed cardiac murmurs 04/13/2013  . Preventive measure 04/13/2013  . Bradycardia 11/21/2011  . Sleep difficulties 11/21/2011  . HYPERTENSION, BENIGN ESSENTIAL 03/22/2009  . TOBACCO ABUSE 10/01/2007  . HYPOTHYROIDISM, POST-RADIOACTIVE IODINE 06/13/2007  . CIRRHOSIS, ALCOHOLIC, LIVER 37/03/6268    Past Surgical History:  Procedure Laterality Date  . BREAST EXCISIONAL BIOPSY Left    benign  . CHOLECYSTECTOMY N/A 12/07/2015   Procedure: LAPAROSCOPIC CHOLECYSTECTOMY WITH INTRAOPERATIVE CHOLANGIOGRAM;  Surgeon: Alphonsa Overall, MD;  Location: [facility];  Service: General;  Laterality: N/A;  . ERCP N/A 12/06/2015   Procedure: ENDOSCOPIC RETROGRADE CHOLANGIOPANCREATOGRAPHY (ERCP);  Surgeon: Milus Banister, MD;  Location: [facility];  Service: Endoscopy;  Laterality: N/A;  . IM NAILING TIBIA Right 07/19/2012   DR Maureen Ralphs  . SHOULDER HEMI-ARTHROPLASTY Right 07/22/2013   Procedure: RIGHT SHOULDER HEMI-ARTHROPLASTY;  Surgeon: Augustin Schooling, MD;  Location: [facility];  Service: Orthopedics;  Laterality: Right;  . TIBIA IM NAIL INSERTION Right 07/19/2013   Procedure: INTRAMEDULLARY (IM) NAIL TIBIAL;  Surgeon: Gearlean Alf, MD;  Location: [facility];  Service: Orthopedics;  Laterality: Right;  . TUBAL LIGATION      OB History    No data available       Home Medications    Prior to Admission medications   Medication Sig Start Date End Date Taking? Authorizing Provider  amLODipine (NORVASC) 10 MG tablet Take 1 tablet (10 mg total) by mouth daily. 12/09/15   Bonnell Public, MD  calcium-vitamin D (OSCAL WITH D) 500-200 MG-UNIT per tablet Take 1 tablet by mouth daily.     [provider]  carvedilol (COREG) 6.25 MG tablet Take 1 tablet (6.25 mg total) by mouth 2 (two) times daily with a meal. 12/09/15   Bonnell Public, MD  citalopram (CELEXA) 20 MG tablet TAKE 1 TABLET BY MOUTH EVERY DAY. 03/17/14   Patrecia Pour, MD  Cyanocobalamin (VITAMIN B12 PO) Take 1 tablet by mouth daily.    [provider]  folic acid (FOLVITE) 485 MCG tablet Take 400 mcg by mouth daily.    [provider]  HYDROcodone-acetaminophen (NORCO/VICODIN) 5-325 MG tablet Take 1 tablet by mouth every 4 (four) hours as needed. Patient taking differently: Take 1  tablet by mouth every 4 (four) hours as needed for moderate pain.  12/02/15   Isla Pence, MD  levothyroxine (SYNTHROID, LEVOTHROID) 125 MCG tablet Take 125 mcg by mouth daily before breakfast.    [provider]  lisinopril (PRINIVIL,ZESTRIL) 20 MG tablet Take 1 tablet (20 mg total) by mouth daily. 12/09/15   Bonnell Public, MD  Multiple Vitamins-Minerals (MULTIVITAMIN & MINERAL PO) Take 1 tablet by mouth daily.    [provider]  Omega-3  Fatty Acids (FISH OIL) 1000 MG CAPS Take 1 capsule by mouth daily.    [provider]  pantoprazole (PROTONIX) 20 MG tablet Take 1 tablet (20 mg total) by mouth daily. 12/02/15   Isla Pence, MD  vitamin E 400 UNIT capsule Take 400 Units by mouth daily.    [provider]    Family History History reviewed. No pertinent family history.  Social History Social History   Tobacco Use  . Smoking status: Current Every Day Smoker    Packs/day: 1.00    Years: 40.00    Pack years: 40.00    Types: Cigarettes  . Smokeless tobacco: Never Used  Substance Use Topics  . Alcohol use: No    Comment: QUIT DRINKING IN 2002  . Drug use: No     Allergies   Oxycodone hcl   Review of Systems Review of Systems  Musculoskeletal: Positive for joint swelling.  All other systems reviewed and are negative.    Physical Exam Updated Vital Signs BP (!) 151/79 (BP Location: Right Arm)   Pulse 88   Temp 98.2 F (36.8 C) (Oral)   Resp 18   Ht 5\' 4"  (1.626 m)   Wt 82.6 kg (182 lb)   SpO2 100%   BMI 31.24 kg/m   Physical Exam  Constitutional: She appears well-developed and well-nourished.  Musculoskeletal: She exhibits edema and tenderness.  Swollen left knee,  Diffusely tender,  Pain with range of motion,  nv and ns intact   Neurological: She is alert.  Skin: Skin is warm.  Psychiatric: She has a normal mood and affect.  Nursing note and vitals reviewed.    ED Treatments / Results  Labs (all labs ordered are listed, but only abnormal results are displayed) Labs Reviewed - No data to display  EKG  EKG Interpretation None       Radiology Dg Knee Complete 4 Views Left  Result Date: 05/22/2017 CLINICAL DATA:  66 y/o  F; fall off wall marked with knee pain. EXAM: LEFT KNEE - COMPLETE 4+ VIEW COMPARISON:  None. FINDINGS: No acute fracture or dislocation identified. Moderate suprapatellar joint effusion. Bones are demineralized. Joint spaces are well maintained.  IMPRESSION: 1. No acute fracture or dislocation identified. 2. Moderate suprapatellar joint effusion. 3. Bones are demineralized. Electronically Signed   By: Kristine Garbe M.D.   On: 05/22/2017 18:32    Procedures Procedures (including critical care time)  Medications Ordered in ED Medications - No data to display   Initial Impression / Assessment and Plan / ED Course  I have reviewed the triage vital signs and the nursing notes.  Pertinent labs & imaging results that were available during my care of the patient were reviewed by me and considered in my medical decision making (see chart for details).     No fracture.  Pt placed in a knee immbolizer,   Pt advised to follow up with her Orthopaedist.   Final Clinical Impressions(s) / ED Diagnoses   Final diagnoses:  Contusion of left knee, initial encounter    ED Discharge Orders    None    An After Visit Summary was printed and given to the patient.    Sidney Ace 05/22/17 2122    Drenda Freeze, MD 05/23/17 Thom Chimes

## 2017-05-22 NOTE — ED Triage Notes (Signed)
Patient reports she fell at [facility]. She reports she slipped on a grape. Patient is complaining of left knee pain with swelling and has a right knee pain.

## 2017-05-28 DIAGNOSIS — S8982XA Other specified injuries of left lower leg, initial encounter: Secondary | ICD-10-CM | POA: Diagnosis not present

## 2017-06-05 DIAGNOSIS — I129 Hypertensive chronic kidney disease with stage 1 through stage 4 chronic kidney disease, or unspecified chronic kidney disease: Secondary | ICD-10-CM | POA: Diagnosis not present

## 2017-06-05 DIAGNOSIS — E039 Hypothyroidism, unspecified: Secondary | ICD-10-CM | POA: Diagnosis not present

## 2017-06-05 DIAGNOSIS — E538 Deficiency of other specified B group vitamins: Secondary | ICD-10-CM | POA: Diagnosis not present

## 2017-06-05 DIAGNOSIS — Z0000 Encounter for general adult medical examination without abnormal findings: Secondary | ICD-10-CM | POA: Diagnosis not present

## 2017-06-05 DIAGNOSIS — I10 Essential (primary) hypertension: Secondary | ICD-10-CM | POA: Diagnosis not present

## 2017-06-05 DIAGNOSIS — Z23 Encounter for immunization: Secondary | ICD-10-CM | POA: Diagnosis not present

## 2017-09-03 DIAGNOSIS — I350 Nonrheumatic aortic (valve) stenosis: Secondary | ICD-10-CM | POA: Diagnosis not present

## 2017-09-03 DIAGNOSIS — I10 Essential (primary) hypertension: Secondary | ICD-10-CM | POA: Diagnosis not present

## 2017-09-03 DIAGNOSIS — R69 Illness, unspecified: Secondary | ICD-10-CM | POA: Diagnosis not present

## 2017-09-03 DIAGNOSIS — I5032 Chronic diastolic (congestive) heart failure: Secondary | ICD-10-CM | POA: Diagnosis not present

## 2017-09-03 DIAGNOSIS — I129 Hypertensive chronic kidney disease with stage 1 through stage 4 chronic kidney disease, or unspecified chronic kidney disease: Secondary | ICD-10-CM | POA: Diagnosis not present

## 2017-09-03 DIAGNOSIS — Z0000 Encounter for general adult medical examination without abnormal findings: Secondary | ICD-10-CM | POA: Diagnosis not present

## 2017-09-03 DIAGNOSIS — J449 Chronic obstructive pulmonary disease, unspecified: Secondary | ICD-10-CM | POA: Diagnosis not present

## 2017-11-05 ENCOUNTER — Ambulatory Visit
Admission: RE | Admit: 2017-11-05 | Discharge: 2017-11-05 | Disposition: A | Discharge status: Home / Self Care | Payer: Medicare HMO | Source: Ambulatory Visit | Attending: Internal Medicine | Admitting: Internal Medicine

## 2017-11-05 ENCOUNTER — Other Ambulatory Visit: Payer: Self-pay | Admitting: Internal Medicine

## 2017-11-05 DIAGNOSIS — N63 Unspecified lump in unspecified breast: Secondary | ICD-10-CM

## 2017-11-05 DIAGNOSIS — N6310 Unspecified lump in the right breast, unspecified quadrant: Secondary | ICD-10-CM

## 2017-11-05 DIAGNOSIS — N6313 Unspecified lump in the right breast, lower outer quadrant: Secondary | ICD-10-CM | POA: Diagnosis not present

## 2017-11-05 DIAGNOSIS — N6320 Unspecified lump in the left breast, unspecified quadrant: Secondary | ICD-10-CM

## 2017-11-05 IMAGING — US ULTRASOUND RIGHT BREAST LIMITED
1 series · 4 of 4 positions shown · non-contrast
Comparison: [DATE] and earlier

CLINICAL DATA: First six-month follow-up for probably benign mass
in the RIGHT breast.

EXAM:
ULTRASOUND OF THE RIGHT BREAST

[Series 1: ultrasound right breast limited · 0.03mm/px · 4 of 4 slices shown]
[im 1/4]
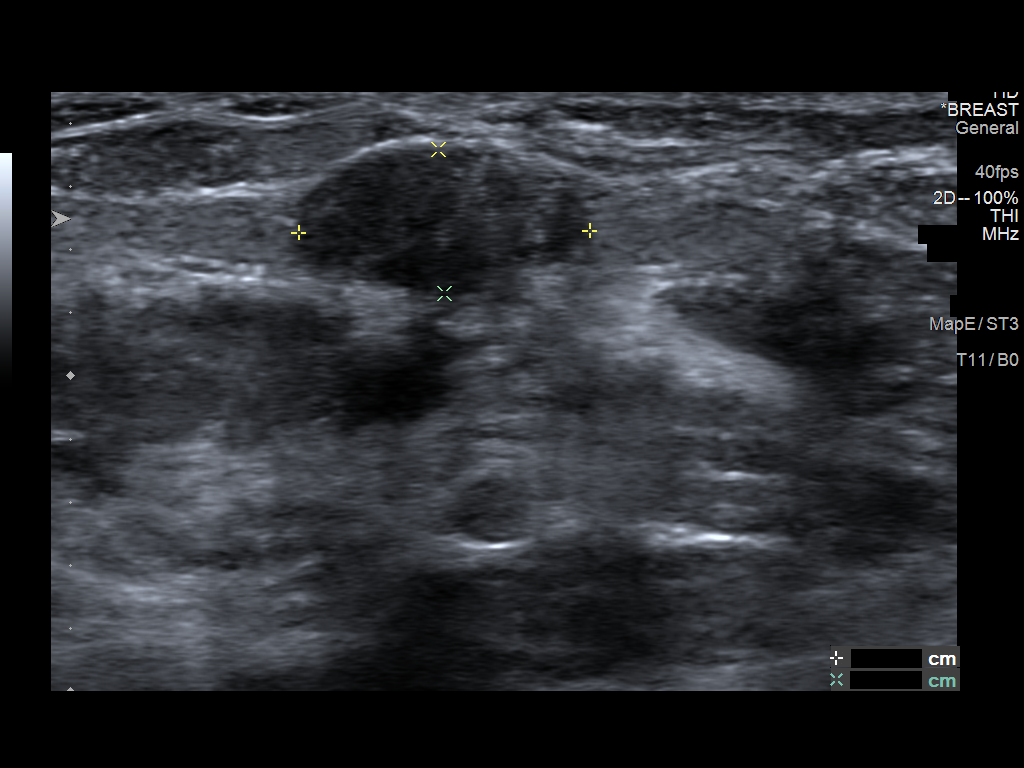
[im 2/4]
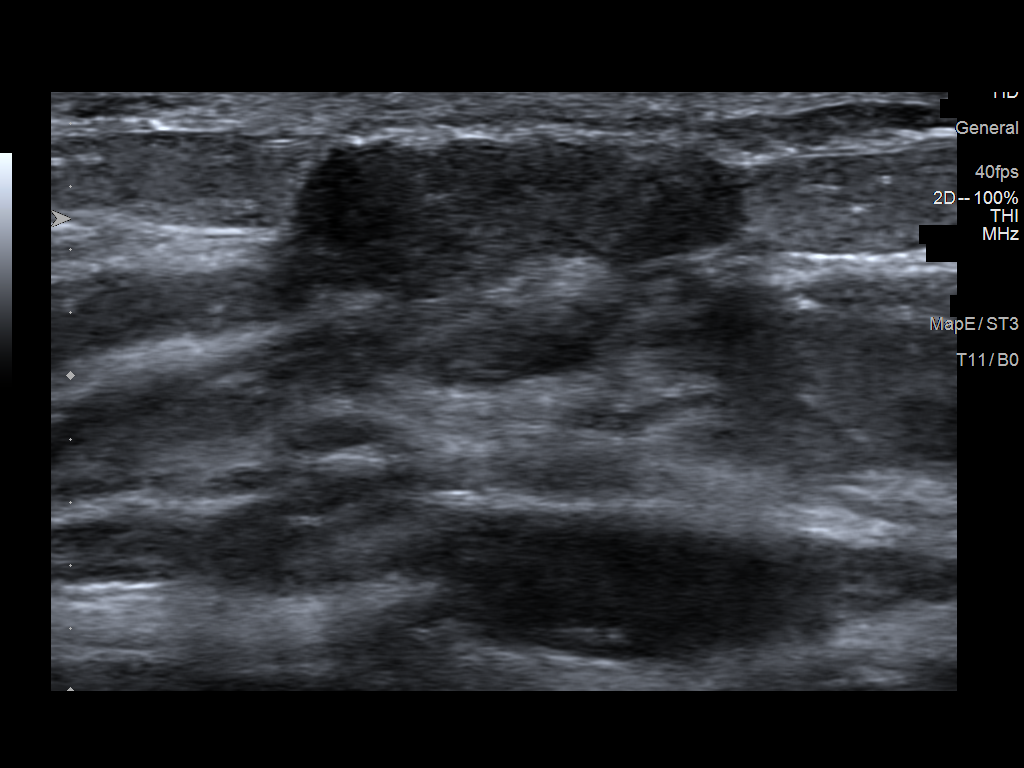
[im 3/4]
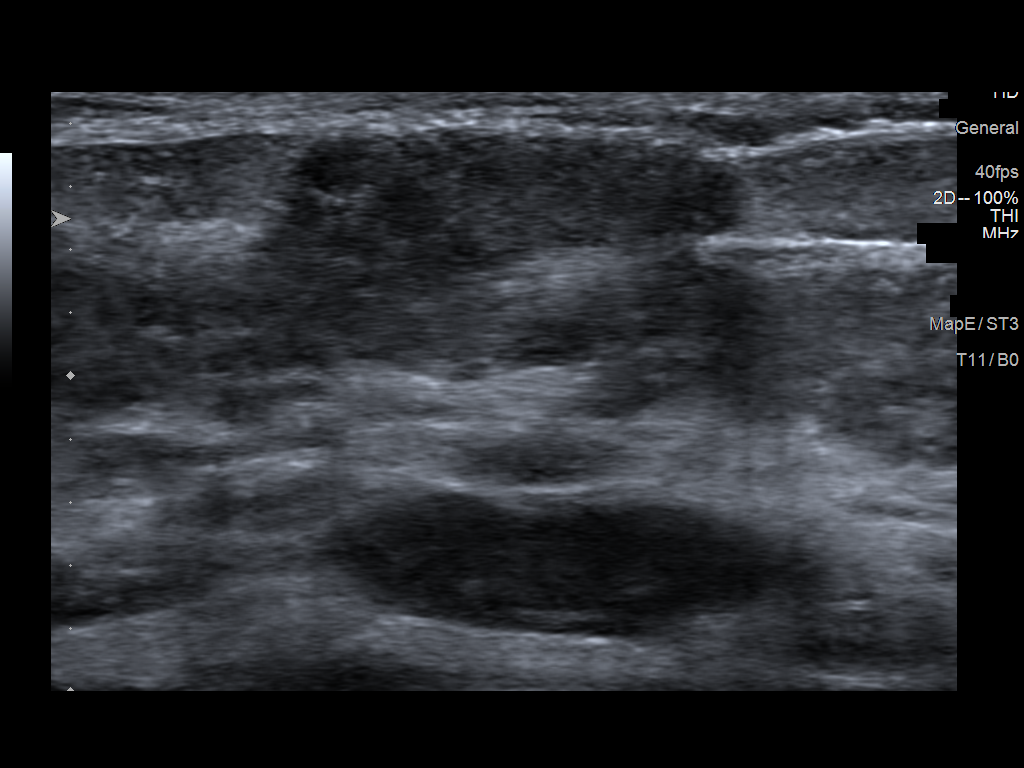
[im 4/4]
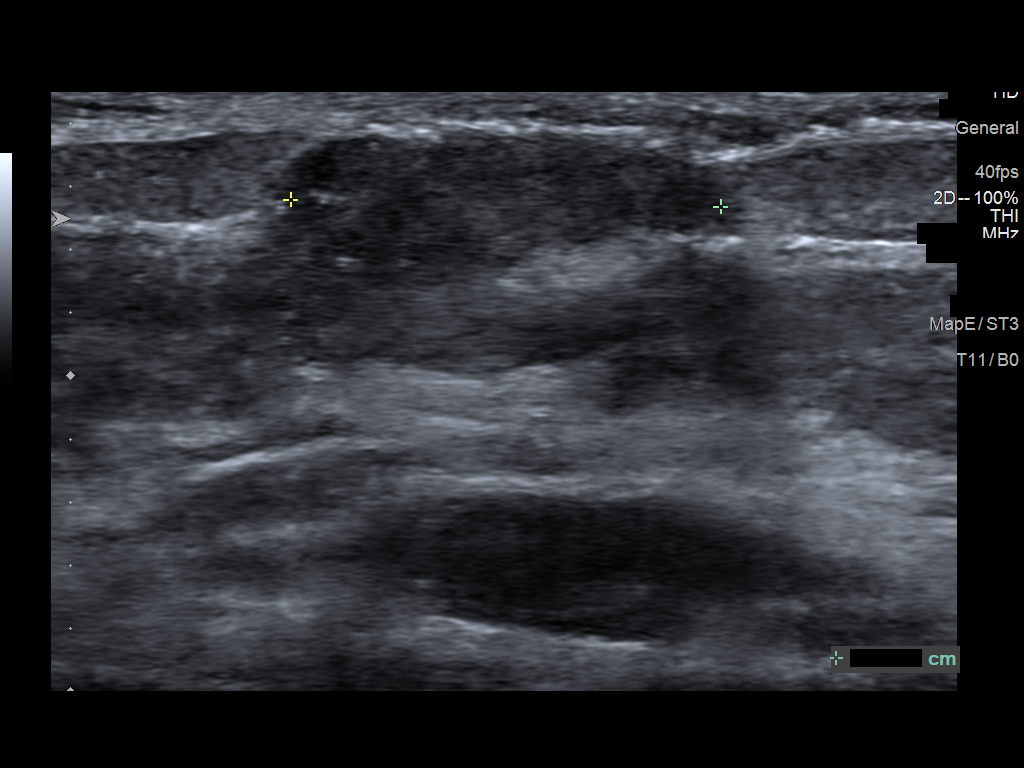

[4 of 4 positions shown; findings below may reference images not displayed]

FINDINGS: Targeted ultrasound is performed, showing a circumscribed parallel
oval mass in the 7 o'clock location RIGHT breast 1 centimeter from
the nipple which measures 1.4 x 0.9 x 0.5 centimeters. Mass is
stable compared to prior study and consistent with benign
fibroadenoma.
IMPRESSION: The stable appearance of a mass in the RIGHT breast. Continued
followup is recommended to document 2 years of stability.

RECOMMENDATION:
Bilateral diagnostic mammogram and RIGHT breast ultrasound
recommended in 6 months.

I have discussed the findings and recommendations with the patient.
Results were also provided in writing at the conclusion of the
visit. If applicable, a reminder letter will be sent to the patient
regarding the next appointment.

BI-RADS CATEGORY  3: Probably benign.

## 2018-02-27 DIAGNOSIS — E039 Hypothyroidism, unspecified: Secondary | ICD-10-CM | POA: Diagnosis not present

## 2018-02-27 DIAGNOSIS — E538 Deficiency of other specified B group vitamins: Secondary | ICD-10-CM | POA: Diagnosis not present

## 2018-02-27 DIAGNOSIS — N182 Chronic kidney disease, stage 2 (mild): Secondary | ICD-10-CM | POA: Diagnosis not present

## 2018-03-06 DIAGNOSIS — I129 Hypertensive chronic kidney disease with stage 1 through stage 4 chronic kidney disease, or unspecified chronic kidney disease: Secondary | ICD-10-CM | POA: Diagnosis not present

## 2018-03-06 DIAGNOSIS — E039 Hypothyroidism, unspecified: Secondary | ICD-10-CM | POA: Diagnosis not present

## 2018-03-06 DIAGNOSIS — N182 Chronic kidney disease, stage 2 (mild): Secondary | ICD-10-CM | POA: Diagnosis not present

## 2018-03-06 DIAGNOSIS — E538 Deficiency of other specified B group vitamins: Secondary | ICD-10-CM | POA: Diagnosis not present

## 2018-03-06 DIAGNOSIS — Z23 Encounter for immunization: Secondary | ICD-10-CM | POA: Diagnosis not present

## 2018-05-13 ENCOUNTER — Other Ambulatory Visit: Payer: Medicare HMO

## 2018-05-19 ENCOUNTER — Other Ambulatory Visit: Payer: Self-pay | Admitting: Internal Medicine

## 2018-05-19 ENCOUNTER — Ambulatory Visit
Admission: RE | Admit: 2018-05-19 | Discharge: 2018-05-19 | Disposition: A | Discharge status: Home / Self Care | Payer: Medicare HMO | Source: Ambulatory Visit | Attending: Internal Medicine | Admitting: Internal Medicine

## 2018-05-19 DIAGNOSIS — N6320 Unspecified lump in the left breast, unspecified quadrant: Secondary | ICD-10-CM

## 2018-05-19 DIAGNOSIS — N6313 Unspecified lump in the right breast, lower outer quadrant: Secondary | ICD-10-CM | POA: Diagnosis not present

## 2018-05-19 DIAGNOSIS — N63 Unspecified lump in unspecified breast: Secondary | ICD-10-CM

## 2018-05-19 DIAGNOSIS — N6310 Unspecified lump in the right breast, unspecified quadrant: Secondary | ICD-10-CM

## 2018-05-19 DIAGNOSIS — R922 Inconclusive mammogram: Secondary | ICD-10-CM | POA: Diagnosis not present

## 2018-05-19 IMAGING — MG DIGITAL DIAGNOSTIC BILATERAL MAMMOGRAM WITH TOMO AND CAD
8 series · 8 of 24 positions shown · non-contrast
Comparison: Previous exam(s).

CLINICAL DATA: 67-year-old female presenting for annual bilateral
mammogram and 1 year follow-up of a probably benign right breast
mass.

EXAM:
DIGITAL DIAGNOSTIC BILATERAL MAMMOGRAM WITH CAD AND TOMO
ULTRASOUND RIGHT BREAST

[R CC synth-2D]
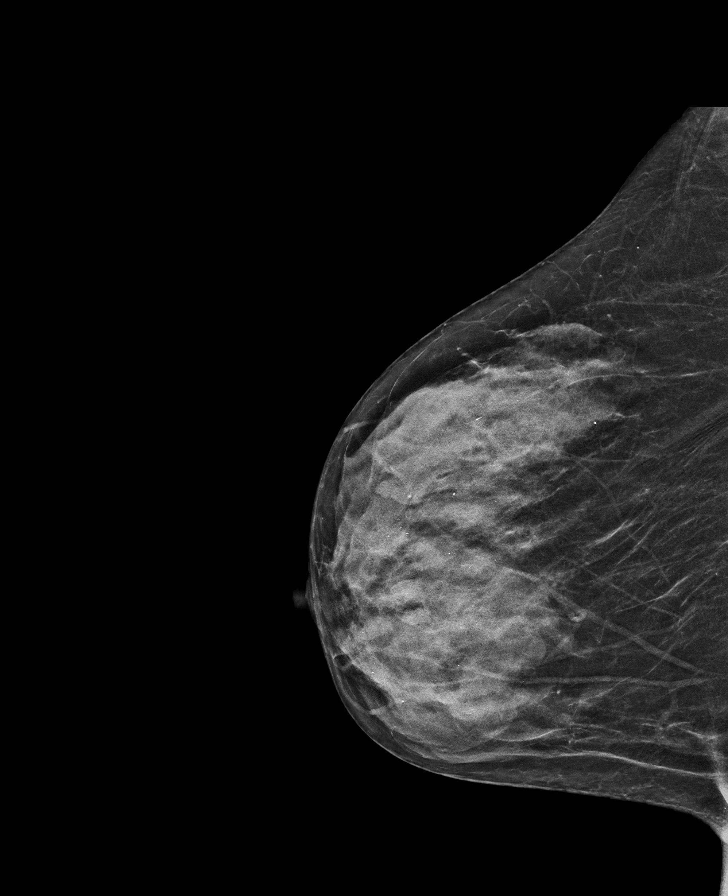

[L CC synth-2D]
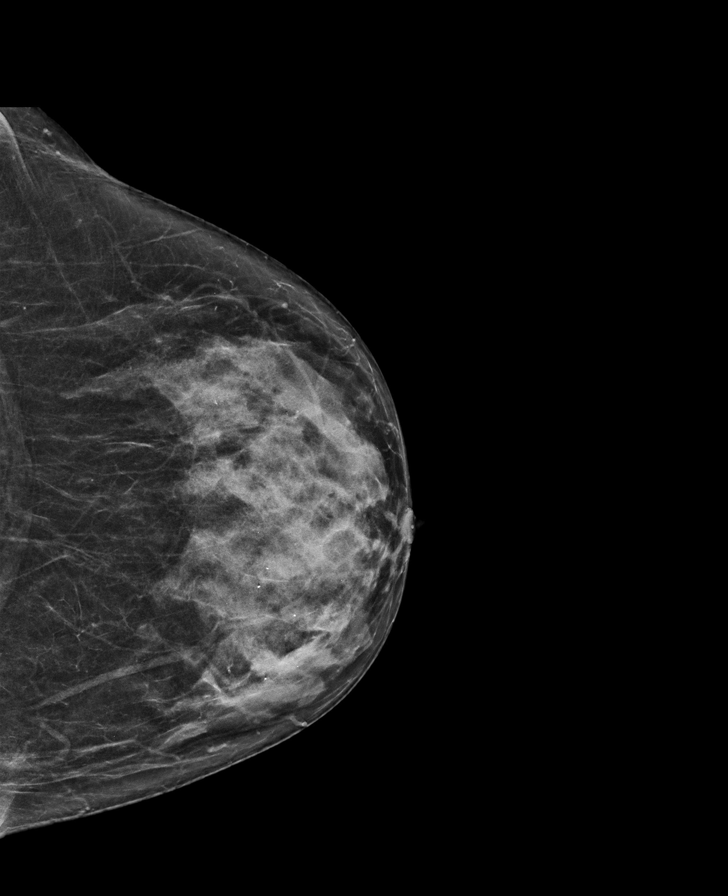

[L MLO synth-2D]
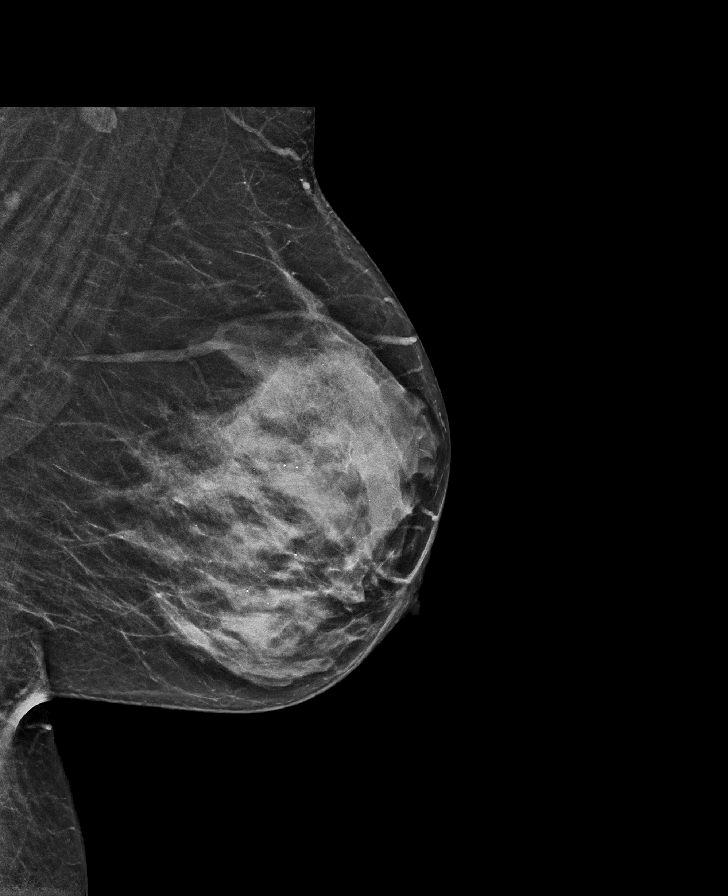

[R MLO synth-2D]
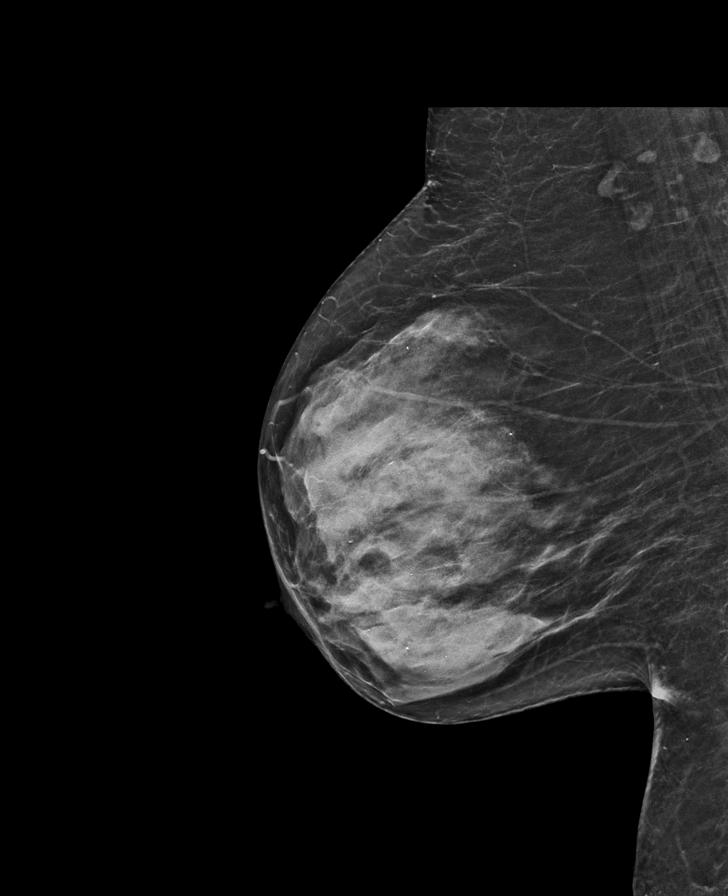

[R CC tomo · tomo slice 28/55.0]
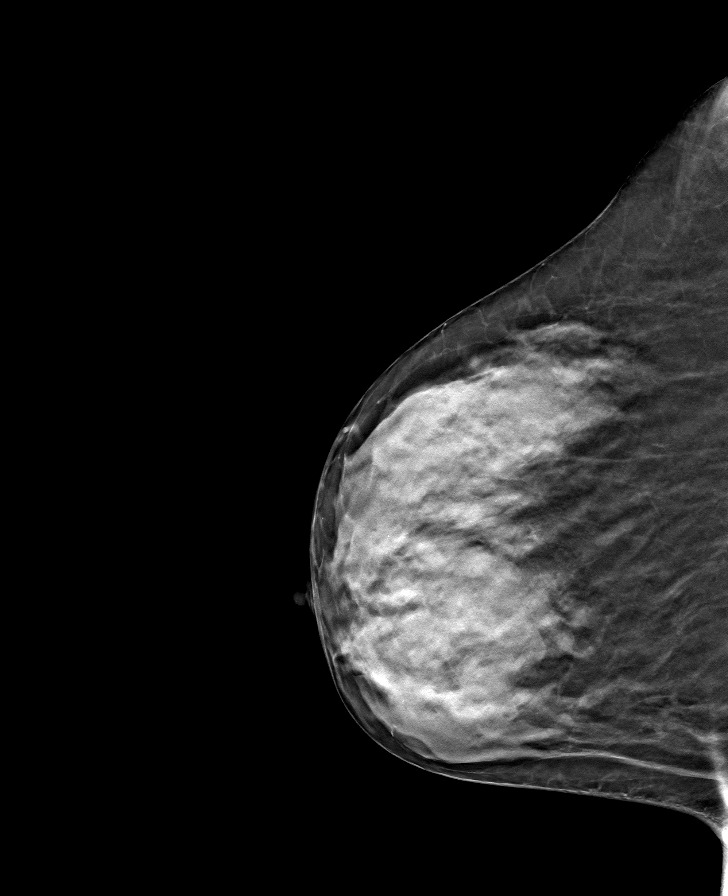

[L MLO tomo · tomo slice 31/62.0]
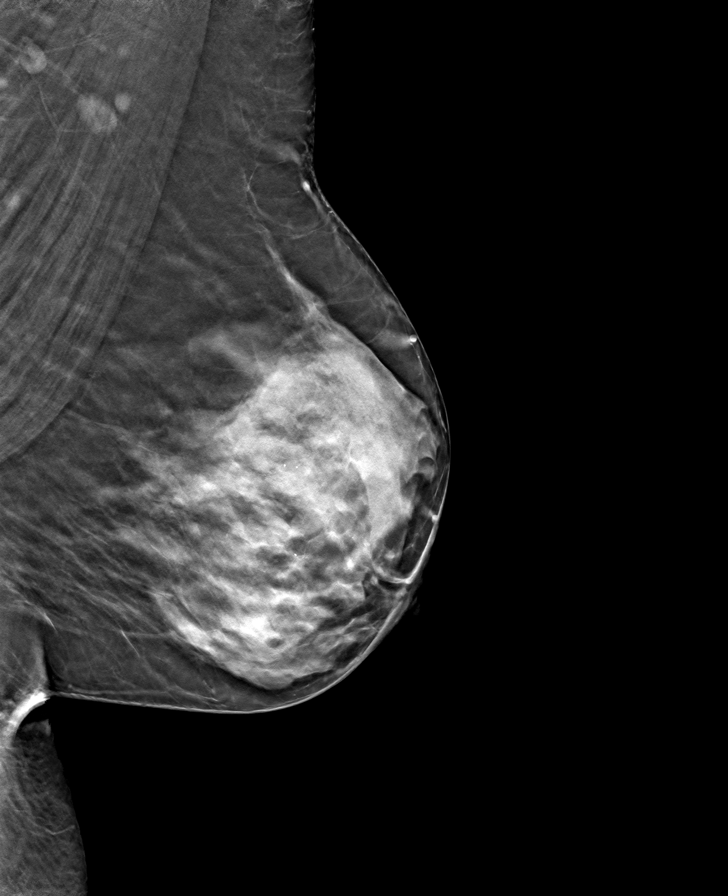

[L CC tomo · tomo slice 31/61.0]
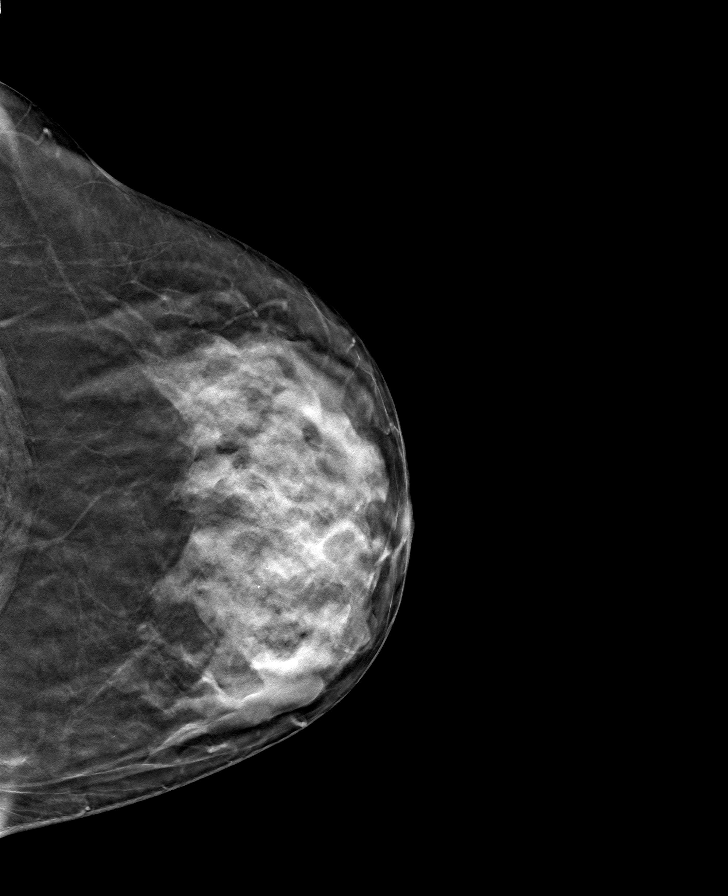

[R MLO tomo · tomo slice 31/61.0]
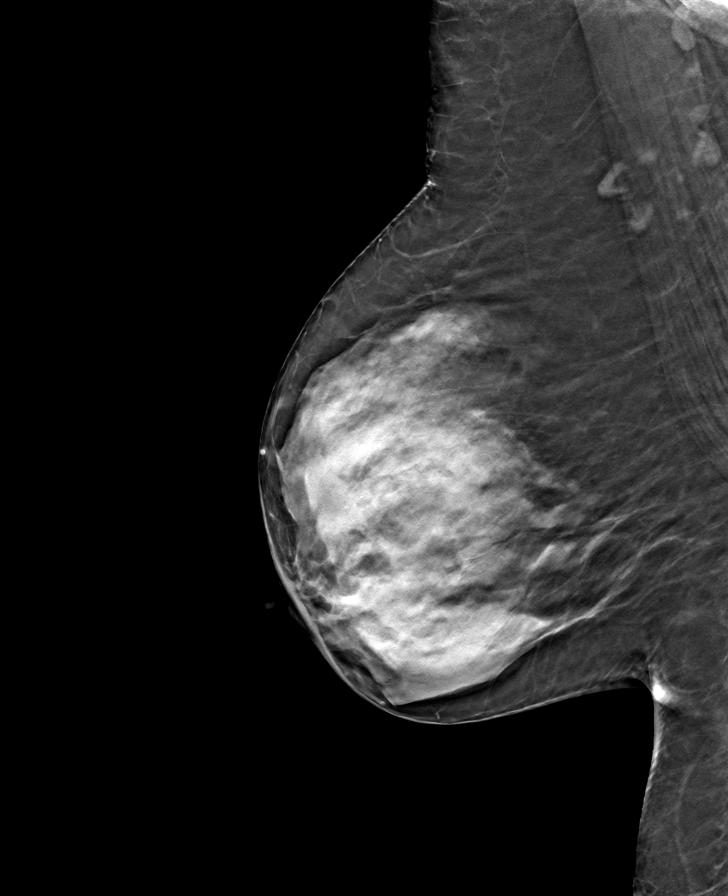

[8 of 24 positions shown; findings below may reference images not displayed]

ACR Breast Density Category d: The breast tissue is extremely dense,
which lowers the sensitivity of mammography.
FINDINGS: Stable, circumscribed masses are noted within the right breast. No
new or suspicious findings are identified in either breast. The
parenchymal pattern is stable.

Mammographic images were processed with CAD.

Targeted ultrasound is performed, showing grossly stable size and
morphology of a circumscribed, hypoechoic mass at the 7 o'clock
position 1 cm from the nipple. It measures 1.6 x 1.0 x 0.5 cm
(previously 1.4 x 0.9 x 0.5 cm). Slight variations are likely due to
positioning and technique.
IMPRESSION: Stable, probably benign right breast mass. Recommendation is for a
final ultrasound follow-up in 1 year to coincide with the patient's
annual bilateral mammogram.

RECOMMENDATION:
Bilateral diagnostic mammogram and right breast ultrasound in 1
year.

I have discussed the findings and recommendations with the patient.
Results were also provided in writing at the conclusion of the
visit. If applicable, a reminder letter will be sent to the patient
regarding the next appointment.

BI-RADS CATEGORY  3: Probably benign.

## 2018-06-25 DIAGNOSIS — K922 Gastrointestinal hemorrhage, unspecified: Secondary | ICD-10-CM

## 2018-06-25 HISTORY — DX: Gastrointestinal hemorrhage, unspecified: K92.2

## 2018-06-30 DIAGNOSIS — J449 Chronic obstructive pulmonary disease, unspecified: Secondary | ICD-10-CM | POA: Diagnosis not present

## 2018-06-30 DIAGNOSIS — E039 Hypothyroidism, unspecified: Secondary | ICD-10-CM | POA: Diagnosis not present

## 2018-06-30 DIAGNOSIS — N182 Chronic kidney disease, stage 2 (mild): Secondary | ICD-10-CM | POA: Diagnosis not present

## 2018-06-30 DIAGNOSIS — R410 Disorientation, unspecified: Secondary | ICD-10-CM | POA: Diagnosis not present

## 2018-06-30 DIAGNOSIS — I10 Essential (primary) hypertension: Secondary | ICD-10-CM | POA: Diagnosis not present

## 2018-06-30 DIAGNOSIS — E538 Deficiency of other specified B group vitamins: Secondary | ICD-10-CM | POA: Diagnosis not present

## 2018-07-01 ENCOUNTER — Encounter: Payer: Self-pay | Admitting: Neurology
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
## 2018-07-09 ENCOUNTER — Other Ambulatory Visit (INDEPENDENT_AMBULATORY_CARE_PROVIDER_SITE_OTHER): Payer: Medicare Other

## 2018-07-09 ENCOUNTER — Ambulatory Visit: Payer: Medicare Other | Admitting: Neurology

## 2018-07-09 ENCOUNTER — Encounter: Payer: Self-pay | Admitting: Neurology

## 2018-07-09 VITALS — BP 114/66 | HR 94 | Ht 64.0 in | Wt 156.0 lb

## 2018-07-09 DIAGNOSIS — E039 Hypothyroidism, unspecified: Secondary | ICD-10-CM | POA: Diagnosis not present

## 2018-07-09 DIAGNOSIS — G459 Transient cerebral ischemic attack, unspecified: Secondary | ICD-10-CM

## 2018-07-09 DIAGNOSIS — I129 Hypertensive chronic kidney disease with stage 1 through stage 4 chronic kidney disease, or unspecified chronic kidney disease: Secondary | ICD-10-CM | POA: Diagnosis not present

## 2018-07-09 DIAGNOSIS — I10 Essential (primary) hypertension: Secondary | ICD-10-CM | POA: Diagnosis not present

## 2018-07-09 DIAGNOSIS — I5032 Chronic diastolic (congestive) heart failure: Secondary | ICD-10-CM | POA: Diagnosis not present

## 2018-07-09 DIAGNOSIS — N182 Chronic kidney disease, stage 2 (mild): Secondary | ICD-10-CM | POA: Diagnosis not present

## 2018-07-09 DIAGNOSIS — J449 Chronic obstructive pulmonary disease, unspecified: Secondary | ICD-10-CM | POA: Diagnosis not present

## 2018-07-09 DIAGNOSIS — F1010 Alcohol abuse, uncomplicated: Secondary | ICD-10-CM | POA: Diagnosis not present

## 2018-07-09 DIAGNOSIS — F17210 Nicotine dependence, cigarettes, uncomplicated: Secondary | ICD-10-CM

## 2018-07-09 DIAGNOSIS — R531 Weakness: Secondary | ICD-10-CM

## 2018-07-09 DIAGNOSIS — G621 Alcoholic polyneuropathy: Secondary | ICD-10-CM | POA: Diagnosis not present

## 2018-07-09 DIAGNOSIS — Z720 Tobacco use: Secondary | ICD-10-CM

## 2018-07-09 LAB — LIPID PANEL
CHOL/HDL RATIO: 2.4 (calc) (ref <5.0)
Cholesterol: 139 mg/dL (ref <200)
HDL: 58 mg/dL (ref >50)
LDL Cholesterol (Calc): 65 mg/dL (calc)
NON-HDL CHOLESTEROL (CALC): 81 mg/dL (ref <130)
TRIGLYCERIDES: 75 mg/dL (ref <150)

## 2018-07-09 NOTE — Patient Instructions (Signed)
1. We have you scheduled for your Carotid Doppler on 07/17/2018 at 11:00 am. Please arrive 15 minutes prior and go to first floor radiology at [facility]. If this is not a good date/time please call [phone] to reschedule.   2. We have sent a referral to [facility] for your MRI and they will call you directly to schedule your appt. They are located at [place]. If you need to contact them directly please call [phone].  3. Your provider has requested that you have labwork completed today. Please go to [facility] ([id number]) on the second floor of this building before leaving the office today. You do not need to check in. If you are not called within 15 minutes please check with the front desk.

## 2018-07-10 ENCOUNTER — Telehealth: Payer: Self-pay | Admitting: Neurology

## 2018-07-10 NOTE — Telephone Encounter (Signed)
-----   Message from [name], DO sent at 07/10/2018  7:29 AM EST ----- pts total chol is better than last time and LDL is less than 70, which is what I wanted.  No changes.  You can let pt know

## 2018-07-10 NOTE — Telephone Encounter (Signed)
Mychart message sent to patient.

## 2018-07-12 ENCOUNTER — Other Ambulatory Visit: Payer: Medicare Other

## 2018-07-15 ENCOUNTER — Inpatient Hospital Stay (HOSPITAL_COMMUNITY)
Admission: EM | Admit: 2018-07-15 | Discharge: 2018-07-18 | DRG: 378 | Disposition: A | Discharge status: Home / Self Care | Payer: Medicare Other | Attending: Internal Medicine | Admitting: Internal Medicine

## 2018-07-15 ENCOUNTER — Other Ambulatory Visit: Payer: Self-pay

## 2018-07-15 ENCOUNTER — Encounter (HOSPITAL_COMMUNITY): Payer: Self-pay | Admitting: Emergency Medicine

## 2018-07-15 DIAGNOSIS — Z9181 History of falling: Secondary | ICD-10-CM

## 2018-07-15 DIAGNOSIS — Z8711 Personal history of peptic ulcer disease: Secondary | ICD-10-CM | POA: Diagnosis not present

## 2018-07-15 DIAGNOSIS — K921 Melena: Secondary | ICD-10-CM | POA: Diagnosis not present

## 2018-07-15 DIAGNOSIS — D62 Acute posthemorrhagic anemia: Secondary | ICD-10-CM | POA: Diagnosis present

## 2018-07-15 DIAGNOSIS — Z801 Family history of malignant neoplasm of trachea, bronchus and lung: Secondary | ICD-10-CM

## 2018-07-15 DIAGNOSIS — E871 Hypo-osmolality and hyponatremia: Secondary | ICD-10-CM | POA: Diagnosis not present

## 2018-07-15 DIAGNOSIS — Z9049 Acquired absence of other specified parts of digestive tract: Secondary | ICD-10-CM

## 2018-07-15 DIAGNOSIS — K5900 Constipation, unspecified: Secondary | ICD-10-CM | POA: Diagnosis present

## 2018-07-15 DIAGNOSIS — E861 Hypovolemia: Secondary | ICD-10-CM | POA: Diagnosis present

## 2018-07-15 DIAGNOSIS — Z79890 Hormone replacement therapy (postmenopausal): Secondary | ICD-10-CM

## 2018-07-15 DIAGNOSIS — I10 Essential (primary) hypertension: Secondary | ICD-10-CM | POA: Diagnosis present

## 2018-07-15 DIAGNOSIS — K264 Chronic or unspecified duodenal ulcer with hemorrhage: Secondary | ICD-10-CM | POA: Diagnosis not present

## 2018-07-15 DIAGNOSIS — X58XXXA Exposure to other specified factors, initial encounter: Secondary | ICD-10-CM | POA: Diagnosis present

## 2018-07-15 DIAGNOSIS — Z66 Do not resuscitate: Secondary | ICD-10-CM | POA: Diagnosis present

## 2018-07-15 DIAGNOSIS — Z8249 Family history of ischemic heart disease and other diseases of the circulatory system: Secondary | ICD-10-CM | POA: Diagnosis not present

## 2018-07-15 DIAGNOSIS — I159 Secondary hypertension, unspecified: Secondary | ICD-10-CM | POA: Diagnosis not present

## 2018-07-15 DIAGNOSIS — I959 Hypotension, unspecified: Secondary | ICD-10-CM | POA: Diagnosis not present

## 2018-07-15 DIAGNOSIS — F17210 Nicotine dependence, cigarettes, uncomplicated: Secondary | ICD-10-CM | POA: Diagnosis present

## 2018-07-15 DIAGNOSIS — T39315A Adverse effect of propionic acid derivatives, initial encounter: Secondary | ICD-10-CM | POA: Diagnosis not present

## 2018-07-15 DIAGNOSIS — E039 Hypothyroidism, unspecified: Secondary | ICD-10-CM

## 2018-07-15 DIAGNOSIS — Z923 Personal history of irradiation: Secondary | ICD-10-CM

## 2018-07-15 DIAGNOSIS — R52 Pain, unspecified: Secondary | ICD-10-CM

## 2018-07-15 DIAGNOSIS — R0902 Hypoxemia: Secondary | ICD-10-CM | POA: Diagnosis not present

## 2018-07-15 DIAGNOSIS — R739 Hyperglycemia, unspecified: Secondary | ICD-10-CM | POA: Diagnosis not present

## 2018-07-15 DIAGNOSIS — Z79899 Other long term (current) drug therapy: Secondary | ICD-10-CM | POA: Diagnosis not present

## 2018-07-15 DIAGNOSIS — K7030 Alcoholic cirrhosis of liver without ascites: Secondary | ICD-10-CM | POA: Diagnosis present

## 2018-07-15 DIAGNOSIS — G8929 Other chronic pain: Secondary | ICD-10-CM | POA: Diagnosis not present

## 2018-07-15 DIAGNOSIS — K922 Gastrointestinal hemorrhage, unspecified: Secondary | ICD-10-CM | POA: Diagnosis not present

## 2018-07-15 DIAGNOSIS — I9589 Other hypotension: Secondary | ICD-10-CM | POA: Diagnosis present

## 2018-07-15 DIAGNOSIS — R531 Weakness: Secondary | ICD-10-CM | POA: Diagnosis not present

## 2018-07-15 DIAGNOSIS — Z885 Allergy status to narcotic agent status: Secondary | ICD-10-CM | POA: Diagnosis not present

## 2018-07-15 DIAGNOSIS — K2971 Gastritis, unspecified, with bleeding: Secondary | ICD-10-CM

## 2018-07-15 DIAGNOSIS — Z791 Long term (current) use of non-steroidal anti-inflammatories (NSAID): Secondary | ICD-10-CM

## 2018-07-15 DIAGNOSIS — S32591A Other specified fracture of right pubis, initial encounter for closed fracture: Secondary | ICD-10-CM | POA: Diagnosis present

## 2018-07-15 DIAGNOSIS — R7989 Other specified abnormal findings of blood chemistry: Secondary | ICD-10-CM | POA: Diagnosis present

## 2018-07-15 DIAGNOSIS — E872 Acidosis: Secondary | ICD-10-CM | POA: Diagnosis not present

## 2018-07-15 DIAGNOSIS — K7010 Alcoholic hepatitis without ascites: Secondary | ICD-10-CM | POA: Diagnosis present

## 2018-07-15 DIAGNOSIS — R740 Nonspecific elevation of levels of transaminase and lactic acid dehydrogenase [LDH]: Secondary | ICD-10-CM | POA: Diagnosis not present

## 2018-07-15 DIAGNOSIS — D72829 Elevated white blood cell count, unspecified: Secondary | ICD-10-CM

## 2018-07-15 DIAGNOSIS — K2970 Gastritis, unspecified, without bleeding: Secondary | ICD-10-CM | POA: Diagnosis present

## 2018-07-15 DIAGNOSIS — K269 Duodenal ulcer, unspecified as acute or chronic, without hemorrhage or perforation: Secondary | ICD-10-CM | POA: Diagnosis not present

## 2018-07-15 DIAGNOSIS — S32511A Fracture of superior rim of right pubis, initial encounter for closed fracture: Secondary | ICD-10-CM | POA: Diagnosis not present

## 2018-07-15 DIAGNOSIS — I213 ST elevation (STEMI) myocardial infarction of unspecified site: Secondary | ICD-10-CM | POA: Diagnosis not present

## 2018-07-15 HISTORY — DX: Gastrointestinal hemorrhage, unspecified: K92.2

## 2018-07-15 HISTORY — DX: Duodenitis with bleeding: K29.81

## 2018-07-15 HISTORY — DX: Cardiac murmur, unspecified: R01.1

## 2018-07-15 LAB — CBC
HCT: 18.8 % — ABNORMAL LOW (ref 36.0–46.0)
HCT: 21.9 % — ABNORMAL LOW (ref 36.0–46.0)
HEMOGLOBIN: 8.2 g/dL — AB (ref 12.0–15.0)
Hemoglobin: 6.7 g/dL — CL (ref 12.0–15.0)
MCH: 31.9 pg (ref 26.0–34.0)
MCH: 32.3 pg (ref 26.0–34.0)
MCHC: 35.6 g/dL (ref 30.0–36.0)
MCHC: 37.4 g/dL — ABNORMAL HIGH (ref 30.0–36.0)
MCV: 89.5 fL (ref 80.0–100.0)
MCV: 86.2 fL (ref 80.0–100.0)
Platelets: 228 10*3/uL (ref 150–400)
Platelets: 162 10*3/uL (ref 150–400)
RBC: 2.1 MIL/uL — ABNORMAL LOW (ref 3.87–5.11)
RBC: 2.54 MIL/uL — ABNORMAL LOW (ref 3.87–5.11)
RDW: 12 % (ref 11.5–15.5)
RDW: 12.5 % (ref 11.5–15.5)
WBC: 11.6 10*3/uL — ABNORMAL HIGH (ref 4.0–10.5)
WBC: 8.3 10*3/uL (ref 4.0–10.5)
nRBC: 0 % (ref 0.0–0.2)
nRBC: 0 % (ref 0.0–0.2)

## 2018-07-15 LAB — URINALYSIS, ROUTINE W REFLEX MICROSCOPIC
Bilirubin Urine: NEGATIVE
GLUCOSE, UA: 50 mg/dL — AB
Hgb urine dipstick: NEGATIVE
Ketones, ur: NEGATIVE mg/dL
Leukocytes, UA: NEGATIVE
Nitrite: NEGATIVE
Protein, ur: NEGATIVE mg/dL
Specific Gravity, Urine: 1.009 (ref 1.005–1.030)
pH: 6 (ref 5.0–8.0)

## 2018-07-15 LAB — COMPREHENSIVE METABOLIC PANEL
ALT: 16 U/L (ref 0–44)
AST: 16 U/L (ref 15–41)
Albumin: 2.6 g/dL — ABNORMAL LOW (ref 3.5–5.0)
Alkaline Phosphatase: 64 U/L (ref 38–126)
Anion gap: 8 (ref 5–15)
BUN: 37 mg/dL — ABNORMAL HIGH (ref 8–23)
CO2: 24 mmol/L (ref 22–32)
Calcium: 9.4 mg/dL (ref 8.9–10.3)
Chloride: 98 mmol/L (ref 98–111)
Creatinine, Ser: 0.82 mg/dL (ref 0.44–1.00)
GFR calc Af Amer: >60 mL/min (ref >60)
GFR calc non Af Amer: >60 mL/min (ref >60)
GLUCOSE: 148 mg/dL — AB (ref 70–99)
Potassium: 3.7 mmol/L (ref 3.5–5.1)
Sodium: 130 mmol/L — ABNORMAL LOW (ref 135–145)
Total Bilirubin: 0.5 mg/dL (ref 0.3–1.2)
Total Protein: 5 g/dL — ABNORMAL LOW (ref 6.5–8.1)

## 2018-07-15 LAB — PROTIME-INR
INR: 1.16
PROTHROMBIN TIME: 14.7 s (ref 11.4–15.2)

## 2018-07-15 LAB — POC OCCULT BLOOD, ED: Fecal Occult Bld: POSITIVE — AB

## 2018-07-15 LAB — LACTIC ACID, PLASMA: Lactic Acid, Venous: 2.6 mmol/L (ref 0.5–1.9)

## 2018-07-15 LAB — PREPARE RBC (CROSSMATCH)

## 2018-07-15 LAB — TSH: TSH: 0.176 u[IU]/mL — ABNORMAL LOW (ref 0.350–4.500)

## 2018-07-15 MED ORDER — SODIUM CHLORIDE 0.9 % IV SOLN
INTRAVENOUS | Status: AC
  Administered 2018-07-15: 04:00:00 via INTRAVENOUS

## 2018-07-15 MED ORDER — SODIUM CHLORIDE 0.9 % IV SOLN: 1.0000 g | Freq: Every day | INTRAVENOUS | Status: DC

## 2018-07-15 MED ORDER — SODIUM CHLORIDE 0.9 % IV SOLN
10.0000 mL/h | Freq: Once | INTRAVENOUS | Status: AC
  Administered 2018-07-15: 10 mL/h via INTRAVENOUS

## 2018-07-15 MED ORDER — SODIUM CHLORIDE 0.9 % IV BOLUS: 1000.0000 mL | Freq: Once | INTRAVENOUS | Status: DC

## 2018-07-15 MED ORDER — LEVOTHYROXINE SODIUM 100 MCG/5ML IV SOLN
68.5000 ug | Freq: Every day | INTRAVENOUS | Status: DC
  Administered 2018-07-15 – 2018-07-16 (×2): 68.5 ug via INTRAVENOUS
  Filled 2018-07-15 (×3): qty 5

## 2018-07-15 MED ORDER — NICOTINE 14 MG/24HR TD PT24
14.0000 mg | MEDICATED_PATCH | Freq: Every day | TRANSDERMAL | Status: DC
  Filled 2018-07-15 (×2): qty 1

## 2018-07-15 MED ORDER — OCTREOTIDE LOAD VIA INFUSION
50.0000 ug | Freq: Once | INTRAVENOUS | Status: AC
  Administered 2018-07-15: 50 ug via INTRAVENOUS
  Filled 2018-07-15: qty 25

## 2018-07-15 MED ORDER — SODIUM CHLORIDE 0.9 % IV SOLN
50.0000 ug/h | INTRAVENOUS | Status: DC
  Administered 2018-07-15 – 2018-07-16 (×4): 50 ug/h via INTRAVENOUS
  Filled 2018-07-15 (×6): qty 1

## 2018-07-15 MED ORDER — SODIUM CHLORIDE 0.9 % IV SOLN
8.0000 mg/h | INTRAVENOUS | Status: AC
  Administered 2018-07-15 – 2018-07-17 (×7): 8 mg/h via INTRAVENOUS
  Filled 2018-07-15 (×9): qty 80

## 2018-07-15 MED ORDER — MORPHINE SULFATE (PF) 2 MG/ML IV SOLN
2.0000 mg | INTRAVENOUS | Status: DC | PRN
  Administered 2018-07-15 – 2018-07-18 (×6): 2 mg via INTRAVENOUS
  Filled 2018-07-15 (×6): qty 1

## 2018-07-15 MED ORDER — SODIUM CHLORIDE 0.9 % IV SOLN
80.0000 mg | Freq: Once | INTRAVENOUS | Status: AC
  Administered 2018-07-15: 80 mg via INTRAVENOUS
  Filled 2018-07-15: qty 80

## 2018-07-15 MED ORDER — SODIUM CHLORIDE 0.9 % IV BOLUS
1000.0000 mL | Freq: Once | INTRAVENOUS | Status: AC
  Administered 2018-07-15: 1000 mL via INTRAVENOUS

## 2018-07-15 MED ORDER — PANTOPRAZOLE SODIUM 40 MG IV SOLR: 40.0000 mg | Freq: Two times a day (BID) | INTRAVENOUS | Status: DC

## 2018-07-15 NOTE — ED Provider Notes (Addendum)
[facility] EMERGENCY DEPARTMENT Provider Note   CSN: [id number] Arrival date & time: 07/15/18  0055     History   Chief Complaint Chief Complaint  Patient presents with  . Rectal Bleeding    HPI [name] is a 68 y.o. female.  The history is provided by the patient.  She has history of hypertension, hypothyroidism, cirrhosis and comes in because of rectal bleeding.  She states that she had been constipated but had 2 normal bowel movements earlier today.  Since then, she has had 3 bowel movements which have had progressively more blood.  Blood is dark red.  She denies abdominal pain, nausea, vomiting.  She did feel weak and clammy at home.  EMS noted hypotension which was not responding to IV fluids.  She denies anticoagulant or antiplatelet agent use, but does admit to regular use of ibuprofen.  Past Medical History:  Diagnosis Date  . ACROCHORDON 03/16/2010  . Heart murmur   . Hypertension   . Hypothyroidism   . Liver cirrhosis ([place])   . Thyroid disease    S/p Radiation    Patient Active Problem List   Diagnosis Date Noted  . Acute gallstone pancreatitis   . Gallstone pancreatitis 12/05/2015  . Acute pancreatitis 12/05/2015  . Choledocholithiasis   . Fracture of proximal end of right humerus 07/22/2013  . Tibia/fibula fracture 07/18/2013  . Mood disorder ([place]) 05/19/2013  . Undiagnosed cardiac murmurs 04/13/2013  . Preventive measure 04/13/2013  . Bradycardia 11/21/2011  . Sleep difficulties 11/21/2011  . HYPERTENSION, BENIGN ESSENTIAL 03/22/2009  . TOBACCO ABUSE 10/01/2007  . HYPOTHYROIDISM, POST-RADIOACTIVE IODINE 06/13/2007  . CIRRHOSIS, ALCOHOLIC, LIVER 32/99/2426    Past Surgical History:  Procedure Laterality Date  . BREAST EXCISIONAL BIOPSY Left    benign  . CHOLECYSTECTOMY N/A 12/07/2015   Procedure: LAPAROSCOPIC CHOLECYSTECTOMY WITH INTRAOPERATIVE CHOLANGIOGRAM;  Surgeon: Alphonsa Overall, MD;  Location: [facility];  Service:  General;  Laterality: N/A;  . ERCP N/A 12/06/2015   Procedure: ENDOSCOPIC RETROGRADE CHOLANGIOPANCREATOGRAPHY (ERCP);  Surgeon: Milus Banister, MD;  Location: [facility];  Service: Endoscopy;  Laterality: N/A;  . IM NAILING TIBIA Right 07/19/2012   DR Maureen Ralphs  . SHOULDER HEMI-ARTHROPLASTY Right 07/22/2013   Procedure: RIGHT SHOULDER HEMI-ARTHROPLASTY;  Surgeon: Augustin Schooling, MD;  Location: [place];  Service: Orthopedics;  Laterality: Right;  . TIBIA IM NAIL INSERTION Right 07/19/2013   Procedure: INTRAMEDULLARY (IM) NAIL TIBIAL;  Surgeon: Gearlean Alf, MD;  Location: [facility];  Service: Orthopedics;  Laterality: Right;  . TUBAL LIGATION       OB History   No obstetric history on file.      Home Medications    Prior to Admission medications   Medication Sig Start Date End Date Taking? Authorizing Provider  amLODipine (NORVASC) 10 MG tablet Take 1 tablet (10 mg total) by mouth daily. 12/09/15   Bonnell Public, MD  Ascorbic Acid (VITAMIN C) 100 MG tablet Take 100 mg by mouth daily.    [provider]  citalopram (CELEXA) 20 MG tablet TAKE 1 TABLET BY MOUTH EVERY DAY. 03/17/14   Patrecia Pour, MD  ferrous sulfate 325 (65 FE) MG EC tablet Take 325 mg by mouth daily.    [provider]  levothyroxine (SYNTHROID, LEVOTHROID) 137 MCG tablet Take 137 mcg by mouth daily. 05/23/18   [provider]  lisinopril-hydrochlorothiazide (PRINZIDE,ZESTORETIC) 20-25 MG tablet Take 1 tablet by mouth daily. 06/24/18   [provider]  Multiple  Vitamins-Minerals (MULTIVITAMIN & MINERAL PO) Take 1 tablet by mouth daily.    [provider]    Family History Family History  Problem Relation Age of Onset  . Heart disease Mother   . Lung cancer Father   . Heart disease Brother   . Psoriasis Brother   . Heart disease Brother   . Healthy Daughter   . Healthy Daughter     Social History Social History   Tobacco Use  . Smoking status: Current Every Day  Smoker    Packs/day: 1.00    Years: 40.00    Pack years: 40.00    Types: Cigarettes  . Smokeless tobacco: Never Used  Substance Use Topics  . Alcohol use: No    Comment: stopped drinking 5 weeks ago/updated 07/09/2018  . Drug use: No     Allergies   Oxycodone hcl   Review of Systems Review of Systems  All other systems reviewed and are negative.    Physical Exam Updated Vital Signs BP (!) 93/59 (BP Location: Right Arm)   Pulse 79   Temp 98.1 F (36.7 C) (Oral)   Resp 20   Ht 5\' 4"  (1.626 m)   Wt 70.8 kg   SpO2 100%   BMI 26.78 kg/m   Physical Exam Vitals signs and nursing note reviewed.    68 year old female, resting comfortably and in no acute distress. Vital signs are significant for borderline low blood pressure 93/59. Oxygen saturation is 100%, which is normal.  General appearance is pale. Head is normocephalic and atraumatic. PERRLA, EOMI. Oropharynx is clear. Neck is nontender and supple without adenopathy or JVD. Back is nontender and there is no CVA tenderness. Lungs are clear without rales, wheezes, or rhonchi. Chest is nontender. Heart has regular rate and rhythm with 3/6 holosystolic murmur best heard along the left sternal border. Abdomen is soft, flat, nontender without masses or hepatosplenomegaly and peristalsis is normoactive. Rectal: Normal sphincter tone.  Moderate amount of maroon blood present Extremities have trace edema, full range of motion is present. Skin is warm and dry without rash. Neurologic: Mental status is normal, cranial nerves are intact, there are no motor or sensory deficits.  ED Treatments / Results  Labs (all labs ordered are listed, but only abnormal results are displayed) Labs Reviewed  COMPREHENSIVE METABOLIC PANEL - Abnormal; Notable for the following components:      Result Value   Sodium 130 (*)    Glucose, Bld 148 (*)    BUN 37 (*)    Total Protein 5.0 (*)    Albumin 2.6 (*)    All other components within  normal limits  CBC - Abnormal; Notable for the following components:   WBC 11.6 (*)    RBC 2.10 (*)    Hemoglobin 6.7 (*)    HCT 18.8 (*)    All other components within normal limits  LACTIC ACID, PLASMA - Abnormal; Notable for the following components:   Lactic Acid, Venous 2.6 (*)    All other components within normal limits  POC OCCULT BLOOD, ED - Abnormal; Notable for the following components:   Fecal Occult Bld POSITIVE (*)    All other components within normal limits  PROTIME-INR  LACTIC ACID, PLASMA  TYPE AND SCREEN  PREPARE RBC (CROSSMATCH)   Procedures Procedures  CRITICAL CARE Performed by: Delora Fuel Total critical care time: 60 minutes Critical care time was exclusive of separately billable procedures and treating other patients. Critical care was necessary to  treat or prevent imminent or life-threatening deterioration. Critical care was time spent personally by me on the following activities: development of treatment plan with patient and/or surrogate as well as nursing, discussions with consultants, evaluation of patient's response to treatment, examination of patient, obtaining history from patient or surrogate, ordering and performing treatments and interventions, ordering and review of laboratory studies, ordering and review of radiographic studies, pulse oximetry and re-evaluation of patient's condition.  Medications Ordered in ED Medications - No data to display   Initial Impression / Assessment and Plan / ED Course  I have reviewed the triage vital signs and the nursing notes.  Pertinent labs & imaging results that were available during my care of the patient were reviewed by me and considered in my medical decision making (see chart for details).  Painless rectal bleeding with hypotension.  Multiple sources possible.  She has history of cirrhosis, so esophageal varices are possible.  Also, with excessive use of ibuprofen, consider gastritis or peptic ulcer.   Painless maroon blood is suggestive of diverticular bleed or AV malformation.  Old records to confirm history of cirrhosis.  She had a prior ED visit for GI bleed at which time she refused hospital admission.  She will be given IV fluids.  Blood pressure is adequate in the ED.  Hemoglobin has come back at 6.7, which is 2.7 g lower than it was in June 2017.  Platelet count is normal as is INR.  BUN is elevated to 37 with normal creatinine consistent with GI bleed and hypotension.  Transaminases and bilirubin are normal.  She is given blood transfusion.  Case is discussed with Dr. Havery Moros of gastroenterology who recommends starting her on octreotide and pantoprazole.  This is ordered.  Case is discussed with Dr. Marlowe Sax of [facility] hospital[facility], who agrees to admit the patient.  Lactic acid level is come back mildly elevated.  This is felt to be due to hypotension and not sepsis.  Final Clinical Impressions(s) / ED Diagnoses   Final diagnoses:  Gastrointestinal hemorrhage, unspecified gastrointestinal hemorrhage type  Hypotension due to hypovolemia    ED Discharge Orders    None       Delora Fuel, MD 79/39/03 0092    Delora Fuel, MD 33/00/76 0225

## 2018-07-15 NOTE — ED Notes (Signed)
Attempted to give report, receiving Rn reported she would return phone call

## 2018-07-15 NOTE — Progress Notes (Addendum)
Patient is a 68 year old female with past medical history of liver cirrhosis secondary to alcohol abuse, hypertension, hypothyroidism who presented to the hospital after multiple episodes of passage of black tarry stools.  Patient reported that she was taking ibuprofen 3 times a day for her chronic pain.  When she presented she was found to be hypotensive, found to be anemic with hemoglobin of 6.7 She is being transfused 2 units of PRBC.  GI consulted.  Currently she is n.p.o. awaiting GI evaluation and possible upper GI endoscopy. Patient seen and examined the bedside in the emergency department.  Currently she is hemodynamically stable.  Continue IV fluids.  PPI and octreotide have been started.  No further episodes of rectal bleeding since admission. Patient seen by Dr. Marlowe Sax this morning.

## 2018-07-15 NOTE — Progress Notes (Signed)
[name] is a 68 y.o. female patient admitted from ED awake, alert - oriented  X 4 - no acute distress noted.  VSS - Blood pressure 124/73, pulse 74, temperature 98.7 F (37.1 C), temperature source Oral, resp. rate (!) 22, height 5\' 4"  (1.626 m), weight 70.8 kg, SpO2 99 %.    IV in place, occlusive dsg intact without redness.  Orientation to room, and floor completed with information packet given to patient/family.  Patient declined safety video at this time.  Admission INP armband ID verified with patient/family, and in place.    SR up x 2, fall assessment complete, with patient and family able to verbalize understanding of risk associated with falls, and verbalized understanding to call nsg before up out of bed.  Call light within reach, patient able to voice, and demonstrate understanding.   Skin, clean-dry- intact without evidence of bruising, or skin tears.   No evidence of skin break down noted on exam.     Will cont to eval and treat per MD orders.  Howard Pouch, RN 07/15/2018 8:34 PM

## 2018-07-15 NOTE — ED Notes (Signed)
Admitting physician bedside. 

## 2018-07-15 NOTE — Consult Note (Addendum)
[facility] Consult: 8:23 AM 07/15/2018  LOS: 0 days    Referring Provider: Dr Tawanna Solo  Primary Care Physician:  Merrilee Seashore, MD Primary Gastroenterologist:  Dr. Deatra Ina    Reason for Consultation:  Melenic stools.  Anemia.     HPI: [name] is a 68 y.o. female.  PMH etoh cirrhosis.  ETOH hepatitis.  ETOH pancreatitis 2001. Quit ETOH within the last month.  Hypothyroidism.  EGD 2004 with NSAID induced large gastric body ulcer.   EGD 05/2006 with large, antral gastric ulcer.  Biopsies: benign ulcerated mucosa, no H pylori   EGD 07/2016 later showed ulcer had healed. Colonoscopy 2007, normal. ERCP with sphincterotomy, stone extraction and lap chole 2017.   Gallstone pancreatitis and cholangitis at the time.  Visit to neuro 07/09/18 for balance issues, word finding difficulty, right leg pain following a fall.  Pt felt she was weak from blood loss of recent dental extractions.  Dr Tat scheduled 08/01/18 MRI to r/o stroke/TIA and planned no antiplatelet meds due to her cirrhosis.     Has been taking 600 -800 mg of ibuprofen/3x daily for several weeks to deal with body pain especially on the right side.  She is not taking any PPI.  In the last couple of days she has had some increased heartburn which resolves with as needed Tums.  Although it is listed as a medication.  The patient is not aware of taking supplemental iron. Earlier yesterday she was a bit constipated but then managed to pass brown stools.  At 6:30 PM last night she passed dark, tarry stools.  No N/V, abdominal pain.  + Positional dizziness, some mild dyspnea all of which has resolved.  BPs 90s/50s-60s, HR 80s at arrival, currently in 1teens/60s-70s, HR 70s - 80s.   Hgb 6.7.  MCV 89.  Platelets, PT/INR normal. Elevated BUN, glucose and  lactic acid.  Low sodium.    She has been started on PPI and octreotide drips.  She has received 2 U PRBCs, repeat hemoglobins pending.  No further melena this morning.  Past Medical History:  Diagnosis Date  . ACROCHORDON 03/16/2010  . Heart murmur   . Hypertension   . Hypothyroidism   . Liver cirrhosis ([place])   . Thyroid disease    S/p Radiation    Past Surgical History:  Procedure Laterality Date  . BREAST EXCISIONAL BIOPSY Left    benign  . CHOLECYSTECTOMY N/A 12/07/2015   Procedure: LAPAROSCOPIC CHOLECYSTECTOMY WITH INTRAOPERATIVE CHOLANGIOGRAM;  Surgeon: Alphonsa Overall, MD;  Location: [facility];  Service: General;  Laterality: N/A;  . ERCP N/A 12/06/2015   Procedure: ENDOSCOPIC RETROGRADE CHOLANGIOPANCREATOGRAPHY (ERCP);  Surgeon: Milus Banister, MD;  Location: [facility];  Service: Endoscopy;  Laterality: N/A;  . IM NAILING TIBIA Right 07/19/2012   DR Maureen Ralphs  . SHOULDER HEMI-ARTHROPLASTY Right 07/22/2013   Procedure: RIGHT SHOULDER HEMI-ARTHROPLASTY;  Surgeon: Augustin Schooling, MD;  Location: [place];  Service: Orthopedics;  Laterality: Right;  . TIBIA IM NAIL INSERTION Right 07/19/2013   Procedure: INTRAMEDULLARY (IM) NAIL TIBIAL;  Surgeon: Pilar Plate  Zella Ball, MD;  Location: [place];  Service: Orthopedics;  Laterality: Right;  . TUBAL LIGATION      Prior to Admission medications  Note that this list may not be accurate.  Medication Sig Start Date End Date Taking? Authorizing Provider  amLODipine (NORVASC) 10 MG tablet Take 1 tablet (10 mg total) by mouth daily. 12/09/15  Yes Dana Allan I, MD  Ascorbic Acid (VITAMIN C) 100 MG tablet Take 100 mg by mouth daily.   Yes [provider]  Bilberry, Vaccinium myrtillus, (BILBERRY FRUIT PO) Take 1 tablet by mouth daily.   Yes [provider]  citalopram (CELEXA) 20 MG tablet TAKE 1 TABLET BY MOUTH EVERY DAY. Patient taking differently: Take 20 mg by mouth daily.  03/17/14  Yes Patrecia Pour, MD      Yes [provider]  Flaxseed, Linseed, (FLAX SEED OIL PO) Take 1 tablet by mouth daily.   Yes [provider]  levothyroxine (SYNTHROID, LEVOTHROID) 137 MCG tablet Take 137 mcg by mouth daily. 05/23/18  Yes [provider]  lisinopril-hydrochlorothiazide (PRINZIDE,ZESTORETIC) 20-25 MG tablet Take 1 tablet by mouth daily. 06/24/18  Yes [provider]  Multiple Vitamins-Minerals (MULTIVITAMIN & MINERAL PO) Take 1 tablet by mouth daily.   Yes [provider]  omega-3 acid ethyl esters (LOVAZA) 1 g capsule Take 1 g by mouth daily.   Yes [provider]  vitamin B-12 (CYANOCOBALAMIN) 1000 MCG tablet Take 1,000 mcg by mouth daily.   Yes [provider]    Scheduled Meds: . levothyroxine  68.5 mcg Intravenous Daily  . nicotine  14 mg Transdermal Daily  . [START ON 07/18/2018] pantoprazole  40 mg Intravenous Q12H   Infusions: . sodium chloride 100 mL/hr at 07/15/18 0807  . octreotide  (SANDOSTATIN)    IV infusion 50 mcg/hr (07/15/18 0259)  . pantoprozole (PROTONIX) infusion 8 mg/hr (07/15/18 0321)   PRN Meds:    Allergies as of 07/15/2018 - Review Complete 07/15/2018  Allergen Reaction Noted  . Oxycodone hcl Other (See Comments) 11/19/2006    Family History  Problem Relation Age of Onset  . Heart disease Mother   . Lung cancer Father   . Heart disease Brother   . Psoriasis Brother   . Heart disease Brother   . Healthy Daughter   . Healthy Daughter     Social History   Socioeconomic History  . Marital status: Widowed    Spouse name: Not on file  . Number of children: Not on file  . Years of education: Not on file  . Highest education level: Not on file  Occupational History  . Not on file  Social Needs  . Financial resource strain: Not on file  . Food insecurity:    Worry: Not on file    Inability: Not on file  . Transportation needs:    Medical: Not on file    Non-medical: Not on file  Tobacco Use  . Smoking status:  Current Every Day Smoker    Packs/day: 1.00    Years: 40.00    Pack years: 40.00    Types: Cigarettes  . Smokeless tobacco: Never Used  Substance and Sexual Activity  . Alcohol use: No    Comment: stopped drinking 5 weeks ago/updated 07/09/2018  . Drug use: No  . Sexual activity: Not on file  Lifestyle  . Physical activity:    Days per week: Not on file    Minutes per session: Not on file  .  Stress: Not on file  Relationships  . Social connections:    Talks on phone: Not on file    Gets together: Not on file    Attends religious service: Not on file    Active member of club or organization: Not on file    Attends meetings of clubs or organizations: Not on file    Relationship status: Not on file  . Intimate partner violence:    Fear of current or ex partner: Not on file    Emotionally abused: Not on file    Physically abused: Not on file    Forced sexual activity: Not on file  Other Topics Concern  . Not on file  Social History Narrative  . Not on file    REVIEW OF SYSTEMS: Constitutional: Patient's activity is limited.  She is able to walk around her house and climb 2 or 3 steps at a time.  Because of a fall a few years ago, she is afraid to climb a staircase.  Does not go out to get her mail.  No weight loss. ENT:  No nose bleeds Pulm: Normally no shortness of breath or cough.  Smokes 30 cigs/day. CV:  No palpitations, no LE edema.  No chest pain. GU:  No hematuria, no frequency GI:  Per HPI Heme: Denies excessive or unusual bleeding or bruising. Transfusions: Transfused 5 units PRBCs in 2012 Neuro:  No headaches, no peripheral tingling or numbness Derm:  No itching, no rash or sores.  Endocrine:  No sweats or chills.  No polyuria or dysuria Immunization: Did not inquire as to recent immunizations. Travel:  None beyond local counties in last few months.    PHYSICAL EXAM: Vital signs in last 24 hours: Vitals:   07/15/18 0715 07/15/18 0730  BP: 119/65 114/80    Pulse: 82 85  Resp: 19 18  Temp:    SpO2: 100% 99%   Wt Readings from Last 3 Encounters:  07/15/18 70.8 kg  07/09/18 70.8 kg  05/22/17 82.6 kg    General: Pleasant, alert WF.  Does not look acutely ill. Head: No facial asymmetry or swelling.  No signs of head trauma. Eyes: No scleral icterus.  No conjunctival pallor. Ears: Hearing deficit in her left ear Nose: No discharge or congestion Mouth: Moist, pink, clear oral mucosa.  Tongue midline.  Dentition fair. Neck: No JVD, no masses, no thyromegaly. Lungs: CTA bilaterally.  No cough or labored breathing Heart: RRR.  Soft 1/6 systolic murmur.  S1, S2 audible. Abdomen: Soft.  Not tender or distended.  No HSM, masses, bruits, hernias.  Bowel sounds active..   Rectal: Deferred rectal exam.  Maroon blood present on DRE per ED physician early this morning. Musc/Skeltl: No joint redness, swelling.  Surgical scar on the right shoulder Extremities: No CCE. Neurologic: Alert.  Some word finding difficulty but no confusion.  Moves all 4 limbs, no gross weakness or deficits.  No asterixis or tremors. Skin: Pale telangiectasias on the upper chest. Tattoos: None observed Nodes: No cervical adenopathy. Psych: Pleasant, cooperative, calm.  Intake/Output from previous day: 01/20 0701 - 01/21 0700 In: 2043.4 [Blood:945; IV Piggyback:1098.4] Out: -  Intake/Output this shift: No intake/output data recorded.  LAB RESULTS: Recent Labs    07/15/18 0104  WBC 11.6*  HGB 6.7*  HCT 18.8*  PLT 228   BMET Lab Results  Component Value Date   NA 130 (L) 07/15/2018   NA 136 12/09/2015   NA 134 (L) 12/08/2015   K 3.7  07/15/2018   K 3.4 (L) 12/09/2015   K 4.2 12/08/2015   CL 98 07/15/2018   CL 103 12/09/2015   CL 102 12/08/2015   CO2 24 07/15/2018   CO2 27 12/09/2015   CO2 27 12/08/2015   GLUCOSE 148 (H) 07/15/2018   GLUCOSE 101 (H) 12/09/2015   GLUCOSE 148 (H) 12/08/2015   BUN 37 (H) 07/15/2018   BUN 10 12/09/2015   BUN 9 12/08/2015    CREATININE 0.82 07/15/2018   CREATININE 0.60 12/09/2015   CREATININE 0.33 (L) 12/08/2015   CALCIUM 9.4 07/15/2018   CALCIUM 8.6 (L) 12/09/2015   CALCIUM 8.2 (L) 12/08/2015   LFT Recent Labs    07/15/18 0104  PROT 5.0*  ALBUMIN 2.6*  AST 16  ALT 16  ALKPHOS 64  BILITOT 0.5   PT/INR Lab Results  Component Value Date   INR 1.16 07/15/2018   INR 1.17 12/06/2015   INR 1.00 12/05/2015   Lipase     Component Value Date/Time   LIPASE 51 12/09/2015 0906    Drugs of Abuse  No results found for: LABOPIA, COCAINSCRNUR, LABBENZ, AMPHETMU, THCU, LABBARB   RADIOLOGY STUDIES: No results found.   IMPRESSION:   *   GIB, melenic stools  Hx bleeds from gastric ulcers in 2004 and 2007.  Suspect she has recurrent NSAID induced ulcer vs gastric varices/portal htn source of bleeding  *   Cirrhosis of liver.  Due to ETOH Platelets and coags ok.    *    ABL anemia.    *    Hyponatremia.  *    Hyperglycemia.     PLAN:     *   EGD, likely tomorrow unless she acutely bleeds or becomes unstable.    *   Next CBC at 1500.  Continue PPI  And octreotide drips.  Clear liquids.   *   Since I do not see a Hep C test in Epic, ordered this along with TSH for next blood draw.      Azucena Freed  07/15/2018, 8:23 AM Phone [phone]   Attending physician's note   I have taken a history, examined the patient and reviewed the chart. I agree with the Advanced Practitioner's note, impression and recommendations.  68 year old female with past history of EtOH, denies ongoing use presented with severe anemia and melena concerning for acute upper GI bleed.  History of significant daily NSAID use  Differential includes NSAID induced gastroduodenal ulcer versus peptic ulcer disease  Less likely to be variceal hemorrhage as physical exam, labs and imaging not suggestive of liver cirrhosis  Monitor hemoglobin twice daily and transfuse as needed to maintain >7  PPI  Plan for EGD  tomorrow morning  N.p.o. after midnight  Discussed in detail with patient to avoid or limit NSAIDs  The risks and benefits as well as alternatives of endoscopic procedure(s) have been discussed and reviewed. All questions answered. The patient agrees to proceed.    Damaris Hippo , MD [phone]

## 2018-07-15 NOTE — ED Triage Notes (Signed)
BIB [facility] from home with c/o of 3 bloody bowel movements that occurred tonight. Pt denies pain or tenderness. Per EMS pt hypotensive PTA, last ems pressure 79/48. Pt diaphoretic, and pale with cap refill greater than 3 seconds on arrival.

## 2018-07-15 NOTE — H&P (Signed)
History and Physical    [name] OZH:[id number] DOB: 02-09-1951 DOA: 07/15/2018  PCP: Merrilee Seashore, MD Patient coming from: Home  Chief Complaint: Dark stools  HPI: [name] is a 68 y.o. female with medical history significant of hypertension, hypothyroidism, liver cirrhosis presenting to the hospital for evaluation of melena.  Patient reports having 3 episodes of dark stools since yesterday afternoon, last episode around 9 PM.  No further episodes since she has been in the ED.  States she had a GI bleed previously secondary to ulcers about 12 years ago.  Denies having any abdominal pain, nausea, or vomiting.  States she quit drinking alcohol before Christmas.  Patient states she fell prior to Christmas and injured her back.  She believes the back pain has improved over time.  Does report taking ibuprofen 400 to 600 mg every 6-8 hours since the fall.  Patient is currently not on a PPI.  Does report having GERD symptoms.  Reports feeling short of breath before EMS arrived and feeling dizzy when standing up.  Denies having any chest pain.  Review of Systems: As per HPI otherwise 10 point review of systems negative.  Past Medical History:  Diagnosis Date  . ACROCHORDON 03/16/2010  . Heart murmur   . Hypertension   . Hypothyroidism   . Liver cirrhosis ([place])   . Thyroid disease    S/p Radiation    Past Surgical History:  Procedure Laterality Date  . BREAST EXCISIONAL BIOPSY Left    benign  . CHOLECYSTECTOMY N/A 12/07/2015   Procedure: LAPAROSCOPIC CHOLECYSTECTOMY WITH INTRAOPERATIVE CHOLANGIOGRAM;  Surgeon: Alphonsa Overall, MD;  Location: [facility];  Service: General;  Laterality: N/A;  . ERCP N/A 12/06/2015   Procedure: ENDOSCOPIC RETROGRADE CHOLANGIOPANCREATOGRAPHY (ERCP);  Surgeon: Milus Banister, MD;  Location: [facility];  Service: Endoscopy;  Laterality: N/A;  . IM NAILING TIBIA Right 07/19/2012   DR Maureen Ralphs  . SHOULDER HEMI-ARTHROPLASTY Right 07/22/2013   Procedure: RIGHT SHOULDER HEMI-ARTHROPLASTY;  Surgeon: Augustin Schooling, MD;  Location: [place];  Service: Orthopedics;  Laterality: Right;  . TIBIA IM NAIL INSERTION Right 07/19/2013   Procedure: INTRAMEDULLARY (IM) NAIL TIBIAL;  Surgeon: Gearlean Alf, MD;  Location: [place];  Service: Orthopedics;  Laterality: Right;  . TUBAL LIGATION       reports that she has been smoking cigarettes. She has a 40.00 pack-year smoking history. She has never used smokeless tobacco. She reports that she does not drink alcohol or use drugs.  Allergies  Allergen Reactions  . Oxycodone Hcl Other (See Comments)    REACTION: hallucinations *PATIENT STATES "NO LONGER AN ALLERGY" 07/09/2018    Family History  Problem Relation Age of Onset  . Heart disease Mother   . Lung cancer Father   . Heart disease Brother   . Psoriasis Brother   . Heart disease Brother   . Healthy Daughter   . Healthy Daughter     Prior to Admission medications   Medication Sig Start Date End Date Taking? Authorizing Provider  amLODipine (NORVASC) 10 MG tablet Take 1 tablet (10 mg total) by mouth daily. 12/09/15  Yes Dana Allan I, MD  Ascorbic Acid (VITAMIN C) 100 MG tablet Take 100 mg by mouth daily.   Yes [provider]  Bilberry, Vaccinium myrtillus, (BILBERRY FRUIT PO) Take 1 tablet by mouth daily.   Yes [provider]  citalopram (CELEXA) 20 MG tablet TAKE 1 TABLET BY MOUTH EVERY DAY. Patient taking differently: Take 20 mg by  mouth daily.  03/17/14  Yes Patrecia Pour, MD  ferrous sulfate 325 (65 FE) MG EC tablet Take 325 mg by mouth daily.   Yes [provider]  Flaxseed, Linseed, (FLAX SEED OIL PO) Take 1 tablet by mouth daily.   Yes [provider]  levothyroxine (SYNTHROID, LEVOTHROID) 137 MCG tablet Take 137 mcg by mouth daily. 05/23/18  Yes [provider]  lisinopril-hydrochlorothiazide (PRINZIDE,ZESTORETIC) 20-25 MG tablet Take 1 tablet by mouth daily. 06/24/18  Yes  [provider]  Multiple Vitamins-Minerals (MULTIVITAMIN & MINERAL PO) Take 1 tablet by mouth daily.   Yes [provider]  omega-3 acid ethyl esters (LOVAZA) 1 g capsule Take 1 g by mouth daily.   Yes [provider]  vitamin B-12 (CYANOCOBALAMIN) 1000 MCG tablet Take 1,000 mcg by mouth daily.   Yes [provider]    Physical Exam: Vitals:   07/15/18 0330 07/15/18 0345 07/15/18 0400 07/15/18 0415  BP: 117/70 102/69 113/69 112/71  Pulse: 86 83 78 85  Resp: 19 18 (!) 24 20  Temp:      TempSrc:      SpO2: 100% 99% 97% 98%  Weight:      Height:        Physical Exam  Constitutional: She is oriented to person, place, and time. She appears well-developed and well-nourished. No distress.  HENT:  Head: Normocephalic.  Mouth/Throat: Oropharynx is clear and moist.  Eyes:  Conjunctival pallor  Neck: Neck supple.  Cardiovascular: Normal rate, regular rhythm and intact distal pulses.  Pulmonary/Chest: Effort normal and breath sounds normal. No respiratory distress. She has no wheezes. She has no rales.  Abdominal: Soft. Bowel sounds are normal. She exhibits no distension. There is no abdominal tenderness. There is no guarding.  Musculoskeletal:        General: No edema.  Neurological: She is alert and oriented to person, place, and time.  Skin: Skin is warm and dry. She is not diaphoretic.  Psychiatric: She has a normal mood and affect. Her behavior is normal.     Labs on Admission: I have personally reviewed following labs and imaging studies  CBC: Recent Labs  Lab 07/15/18 0104  WBC 11.6*  HGB 6.7*  HCT 18.8*  MCV 89.5  PLT 009   Basic Metabolic Panel: Recent Labs  Lab 07/15/18 0104  NA 130*  K 3.7  CL 98  CO2 24  GLUCOSE 148*  BUN 37*  CREATININE 0.82  CALCIUM 9.4   GFR: Estimated Creatinine Clearance: 62.5 mL/min (by C-G formula based on SCr of 0.82 mg/dL). Liver Function Tests: Recent Labs  Lab 07/15/18 0104  AST 16    ALT 16  ALKPHOS 64  BILITOT 0.5  PROT 5.0*  ALBUMIN 2.6*   No results for input(s): LIPASE, AMYLASE in the last 168 hours. No results for input(s): AMMONIA in the last 168 hours. Coagulation Profile: Recent Labs  Lab 07/15/18 0104  INR 1.16   Cardiac Enzymes: No results for input(s): CKTOTAL, CKMB, CKMBINDEX, TROPONINI in the last 168 hours. BNP (last 3 results) No results for input(s): PROBNP in the last 8760 hours. HbA1C: No results for input(s): HGBA1C in the last 72 hours. CBG: No results for input(s): GLUCAP in the last 168 hours. Lipid Profile: No results for input(s): CHOL, HDL, LDLCALC, TRIG, CHOLHDL, LDLDIRECT in the last 72 hours. Thyroid Function Tests: No results for input(s): TSH, T4TOTAL, FREET4, T3FREE, THYROIDAB in the last 72 hours. Anemia Panel: No results for input(s): VITAMINB12,  FOLATE, FERRITIN, TIBC, IRON, RETICCTPCT in the last 72 hours. Urine analysis:    Component Value Date/Time   COLORURINE ORANGE (A) 12/05/2015 1230   APPEARANCEUR CLEAR 12/05/2015 1230   LABSPEC 1.014 12/05/2015 1230   PHURINE 6.5 12/05/2015 1230   GLUCOSEU NEGATIVE 12/05/2015 1230   HGBUR NEGATIVE 12/05/2015 1230   BILIRUBINUR LARGE (A) 12/05/2015 1230   KETONESUR NEGATIVE 12/05/2015 1230   PROTEINUR NEGATIVE 12/05/2015 1230   NITRITE NEGATIVE 12/05/2015 1230   LEUKOCYTESUR TRACE (A) 12/05/2015 1230    Radiological Exams on Admission: No results found.  Assessment/Plan Principal Problem:   GI bleed Active Problems:   Leukocytosis   Hyponatremia   HTN (hypertension)   Hypothyroidism   Suspected upper GI bleed -Patient is presenting with 1 day history of melena.  Hypotensive with blood pressure 79/48 per EMS, now improved with IV fluid.  Not tachycardic.  Hemoglobin 6.7, no recent baseline.  Hemoglobin was 10.4 two years ago.  BUN 37.  FOBT positive.  Patient is not complaining of abdominal pain.  Abdominal exam benign.  No further episodes of melena in the ED.    -She had a normal EGD in November 2009. -Differentials for melena include esophageal varices in the setting of liver cirrhosis, gastritis, and peptic ulcer disease. -ED physician spoke to Dr. Havery Moros from GI who recommended IV PPI and octreotide.  GI will see the patient in the morning. -Keep n.p.o. -IV fluid resuscitation -IV PPI bolus and infusion -IV octreotide bolus and infusion -Type and screen -2 large-bore IVs -2 units PRBCs ordered in the ED for symptomatic anemia -Check H&H after transfusion; continue to monitor CBC  Mild leukocytosis Likely reactive.  Patient is afebrile.  White count 11.6.  Lactic acid mildly elevated at 2.6 in the setting of GI bleed.  Patient reports dyspnea and dizziness at home which could be attributed to severe anemia.  Breathing comfortably on room air and not hypoxic. -Check UA -Continue to monitor CBC -Continue IV fluid -Continue to trend lactate  Hyponatremia Corrected sodium 129. -IV fluid -Continue to monitor BMP  Hypertension -Hold home antihypertensives at this time  Hypothyroidism -Continue home Synthroid in IV form  DVT prophylaxis: SCDs Code Status: Patient wishes to be DNR. Family Communication: Grandson and son-in-law at bedside. Disposition Plan: Anticipate discharge after clinical improvement. Consults called: GI Admission status: Observation, progressive care unit   Shela Leff MD [facility] Pager [phone]  If 7PM-7AM, please contact night-coverage [email or web address] Password [facility]  07/15/2018, 4:41 AM

## 2018-07-15 NOTE — ED Notes (Signed)
MD made aware of Pt's request for pain medication

## 2018-07-15 NOTE — ED Notes (Signed)
Pt placed on purewick 

## 2018-07-15 NOTE — Progress Notes (Signed)
Pt c/o left posterior thigh numbness and pain 5/10, and right breast pain 4/10 that started few days ago per pt. Only pain med, morphine for severe pain. Schorr, NP notified. No redness, no swelling at thigh or breast.

## 2018-07-16 ENCOUNTER — Encounter (HOSPITAL_COMMUNITY): Payer: Self-pay | Admitting: *Deleted

## 2018-07-16 ENCOUNTER — Observation Stay (HOSPITAL_COMMUNITY): Payer: Medicare Other | Admitting: Anesthesiology

## 2018-07-16 ENCOUNTER — Encounter (HOSPITAL_COMMUNITY)
Admission: EM | Disposition: A | Discharge status: Home / Self Care | Payer: Self-pay | Source: Home / Self Care | Attending: Internal Medicine

## 2018-07-16 DIAGNOSIS — Z66 Do not resuscitate: Secondary | ICD-10-CM | POA: Diagnosis present

## 2018-07-16 DIAGNOSIS — E871 Hypo-osmolality and hyponatremia: Secondary | ICD-10-CM | POA: Diagnosis present

## 2018-07-16 DIAGNOSIS — D62 Acute posthemorrhagic anemia: Secondary | ICD-10-CM | POA: Diagnosis present

## 2018-07-16 DIAGNOSIS — Z79890 Hormone replacement therapy (postmenopausal): Secondary | ICD-10-CM | POA: Diagnosis not present

## 2018-07-16 DIAGNOSIS — K269 Duodenal ulcer, unspecified as acute or chronic, without hemorrhage or perforation: Secondary | ICD-10-CM | POA: Diagnosis not present

## 2018-07-16 DIAGNOSIS — I9589 Other hypotension: Secondary | ICD-10-CM | POA: Diagnosis present

## 2018-07-16 DIAGNOSIS — E039 Hypothyroidism, unspecified: Secondary | ICD-10-CM | POA: Diagnosis present

## 2018-07-16 DIAGNOSIS — K264 Chronic or unspecified duodenal ulcer with hemorrhage: Secondary | ICD-10-CM | POA: Diagnosis present

## 2018-07-16 DIAGNOSIS — E861 Hypovolemia: Secondary | ICD-10-CM | POA: Diagnosis present

## 2018-07-16 DIAGNOSIS — X58XXXA Exposure to other specified factors, initial encounter: Secondary | ICD-10-CM | POA: Diagnosis present

## 2018-07-16 DIAGNOSIS — Z8249 Family history of ischemic heart disease and other diseases of the circulatory system: Secondary | ICD-10-CM | POA: Diagnosis not present

## 2018-07-16 DIAGNOSIS — Z79899 Other long term (current) drug therapy: Secondary | ICD-10-CM | POA: Diagnosis not present

## 2018-07-16 DIAGNOSIS — T39315A Adverse effect of propionic acid derivatives, initial encounter: Secondary | ICD-10-CM | POA: Diagnosis present

## 2018-07-16 DIAGNOSIS — Z9181 History of falling: Secondary | ICD-10-CM | POA: Diagnosis not present

## 2018-07-16 DIAGNOSIS — F17210 Nicotine dependence, cigarettes, uncomplicated: Secondary | ICD-10-CM | POA: Diagnosis present

## 2018-07-16 DIAGNOSIS — E872 Acidosis: Secondary | ICD-10-CM | POA: Diagnosis present

## 2018-07-16 DIAGNOSIS — I10 Essential (primary) hypertension: Secondary | ICD-10-CM | POA: Diagnosis present

## 2018-07-16 DIAGNOSIS — K921 Melena: Secondary | ICD-10-CM | POA: Diagnosis not present

## 2018-07-16 DIAGNOSIS — Z8711 Personal history of peptic ulcer disease: Secondary | ICD-10-CM | POA: Diagnosis not present

## 2018-07-16 DIAGNOSIS — Z9049 Acquired absence of other specified parts of digestive tract: Secondary | ICD-10-CM | POA: Diagnosis not present

## 2018-07-16 DIAGNOSIS — R739 Hyperglycemia, unspecified: Secondary | ICD-10-CM | POA: Diagnosis present

## 2018-07-16 DIAGNOSIS — S32591A Other specified fracture of right pubis, initial encounter for closed fracture: Secondary | ICD-10-CM | POA: Diagnosis present

## 2018-07-16 DIAGNOSIS — G8929 Other chronic pain: Secondary | ICD-10-CM | POA: Diagnosis present

## 2018-07-16 DIAGNOSIS — Z801 Family history of malignant neoplasm of trachea, bronchus and lung: Secondary | ICD-10-CM | POA: Diagnosis not present

## 2018-07-16 DIAGNOSIS — R7989 Other specified abnormal findings of blood chemistry: Secondary | ICD-10-CM | POA: Diagnosis present

## 2018-07-16 DIAGNOSIS — I159 Secondary hypertension, unspecified: Secondary | ICD-10-CM | POA: Diagnosis not present

## 2018-07-16 DIAGNOSIS — Z885 Allergy status to narcotic agent status: Secondary | ICD-10-CM | POA: Diagnosis not present

## 2018-07-16 DIAGNOSIS — K7030 Alcoholic cirrhosis of liver without ascites: Secondary | ICD-10-CM | POA: Diagnosis present

## 2018-07-16 DIAGNOSIS — Z923 Personal history of irradiation: Secondary | ICD-10-CM | POA: Diagnosis not present

## 2018-07-16 HISTORY — PX: BIOPSY: SHX5522

## 2018-07-16 HISTORY — PX: ESOPHAGOGASTRODUODENOSCOPY (EGD) WITH PROPOFOL: SHX5813

## 2018-07-16 HISTORY — DX: Duodenal ulcer, unspecified as acute or chronic, without hemorrhage or perforation: K26.9

## 2018-07-16 LAB — MRSA PCR SCREENING: MRSA by PCR: NEGATIVE

## 2018-07-16 LAB — CBC WITH DIFFERENTIAL/PLATELET
ABS IMMATURE GRANULOCYTES: 0.05 10*3/uL (ref 0.00–0.07)
Basophils Absolute: 0 10*3/uL (ref 0.0–0.1)
Basophils Relative: 1 %
Eosinophils Absolute: 0.6 10*3/uL — ABNORMAL HIGH (ref 0.0–0.5)
Eosinophils Relative: 8 %
HCT: 20.7 % — ABNORMAL LOW (ref 36.0–46.0)
Hemoglobin: 7.4 g/dL — ABNORMAL LOW (ref 12.0–15.0)
Immature Granulocytes: 1 %
Lymphocytes Relative: 35 %
Lymphs Abs: 2.5 10*3/uL (ref 0.7–4.0)
MCH: 31 pg (ref 26.0–34.0)
MCHC: 35.7 g/dL (ref 30.0–36.0)
MCV: 86.6 fL (ref 80.0–100.0)
Monocytes Absolute: 0.4 10*3/uL (ref 0.1–1.0)
Monocytes Relative: 5 %
Neutro Abs: 3.7 10*3/uL (ref 1.7–7.7)
Neutrophils Relative %: 50 %
PLATELETS: 155 10*3/uL (ref 150–400)
RBC: 2.39 MIL/uL — ABNORMAL LOW (ref 3.87–5.11)
RDW: 12.7 % (ref 11.5–15.5)
WBC: 7.3 10*3/uL (ref 4.0–10.5)
nRBC: 0 % (ref 0.0–0.2)

## 2018-07-16 LAB — TYPE AND SCREEN
ABO/RH(D): A POS
Antibody Screen: NEGATIVE
Unit division: 0
Unit division: 0

## 2018-07-16 LAB — BASIC METABOLIC PANEL
Anion gap: 6 (ref 5–15)
BUN: 10 mg/dL (ref 8–23)
CO2: 24 mmol/L (ref 22–32)
Calcium: 8.2 mg/dL — ABNORMAL LOW (ref 8.9–10.3)
Chloride: 104 mmol/L (ref 98–111)
Creatinine, Ser: 0.57 mg/dL (ref 0.44–1.00)
GFR calc Af Amer: >60 mL/min (ref >60)
GFR calc non Af Amer: >60 mL/min (ref >60)
Glucose, Bld: 116 mg/dL — ABNORMAL HIGH (ref 70–99)
Potassium: 4 mmol/L (ref 3.5–5.1)
Sodium: 134 mmol/L — ABNORMAL LOW (ref 135–145)

## 2018-07-16 LAB — BPAM RBC
Blood Product Expiration Date: 202002022359
Blood Product Expiration Date: 202002022359
ISSUE DATE / TIME: 202001210219
ISSUE DATE / TIME: 202001210536
UNIT TYPE AND RH: 6200
Unit Type and Rh: 6200

## 2018-07-16 LAB — HEPATITIS C ANTIBODY: HCV Ab: 0.2 s/co ratio (ref 0.0–0.9)

## 2018-07-16 LAB — HIV ANTIBODY (ROUTINE TESTING W REFLEX): HIV Screen 4th Generation wRfx: NONREACTIVE

## 2018-07-16 LAB — LACTIC ACID, PLASMA: Lactic Acid, Venous: 0.8 mmol/L (ref 0.5–1.9)

## 2018-07-16 LAB — T4, FREE: Free T4: 1.67 ng/dL (ref 0.82–1.77)

## 2018-07-16 SURGERY — ESOPHAGOGASTRODUODENOSCOPY (EGD) WITH PROPOFOL
Anesthesia: Monitor Anesthesia Care

## 2018-07-16 MED ORDER — PROPOFOL 500 MG/50ML IV EMUL
INTRAVENOUS | Status: DC | PRN
  Administered 2018-07-16: 100 ug/kg/min via INTRAVENOUS

## 2018-07-16 MED ORDER — LACTATED RINGERS IV SOLN
INTRAVENOUS | Status: DC | PRN
  Administered 2018-07-16: 11:00:00 via INTRAVENOUS

## 2018-07-16 MED ORDER — LEVOTHYROXINE SODIUM 25 MCG PO TABS
137.0000 ug | ORAL_TABLET | Freq: Every day | ORAL | Status: DC
  Administered 2018-07-17 – 2018-07-18 (×2): 137 ug via ORAL
  Filled 2018-07-16 (×2): qty 1

## 2018-07-16 MED ORDER — HYDROCODONE-ACETAMINOPHEN 5-325 MG PO TABS
2.0000 | ORAL_TABLET | Freq: Once | ORAL | Status: AC
  Administered 2018-07-16: 2 via ORAL
  Filled 2018-07-16: qty 2

## 2018-07-16 SURGICAL SUPPLY — 15 items

## 2018-07-16 NOTE — Progress Notes (Signed)
PROGRESS NOTE    [name]  VZC:[id number] DOB: Dec 10, 1950 DOA: 07/15/2018 PCP: Merrilee Seashore, MD   Brief Narrative: Patient is a 68 year old female with past medical history of liver cirrhosis secondary to alcohol abuse, hypertension, hypothyroidism who presented to the hospital after multiple episodes of passage of black tarry stools.  Patient reported that she was taking ibuprofen 3 times a day for her chronic pain.  When she presented she was found to be hypotensive, found to be anemic with hemoglobin of 6.7 She was transfuse with 2 units of PRBC.  GI consulted.  She underwent upper GI endoscopy by GI today.  Found to have duodenal ulcers  suspected to be from NSAIDs.  Plans to continue PPI drip.  Assessment & Plan:   Principal Problem:   GI bleed Active Problems:   Leukocytosis   Hyponatremia   HTN (hypertension)   Hypothyroidism  Upper GI bleed: Presented with multiple episodes of melena.  Hypotensive on presentation.  Currently hemodynamically stable.  Transfused with PRBC.  Hemoglobin today 7.4 Underwent EGD today by GI with finding of multiple ulcers in the duodenum suspected to be from NSAIDs.  Avoid NSAIDs, aspirin .Currently on clear liquid diet.  Plan is to advance to soft diet as tolerated.  Continue PPI drip for total of 72 hours. We will check CBC tomorrow.  Leukocytosis: Reactive.  No indication for antibiotic therapy.  Hyponatremia: Improved with IV fluids.  Hypertension: Hypotensive on presentation.  Home antihypertensives held.  Currently blood pressure stable.  Hypothyroidism: Continue Synthyroid.          DVT prophylaxis:SCD Code Status: DNR Family Communication: None present at the bedside Disposition Plan: Home tomorrow   Consultants: GI  Procedures: EGD  Antimicrobials:  Anti-infectives (From admission, onward)   Start     Dose/Rate Route Frequency Ordered Stop   07/15/18 0900  cefTRIAXone (ROCEPHIN) 1 g in sodium chloride 0.9  % 100 mL IVPB  Status:  Discontinued     1 g 200 mL/hr over 30 Minutes Intravenous Daily 07/15/18 0738 07/15/18 0758      Subjective: Patient seen and examined at the bedside this morning before endoscopy.  She had a bowel movement with some blood last night.  Currently hemodynamically stable.  Did not complain of any abdominal pain..  Objective: Vitals:   07/16/18 0829 07/16/18 1026 07/16/18 1146 07/16/18 1150  BP: 139/75 (!) 161/64 129/65 (!) 144/66  Pulse:  65 63 62  Resp:  19 20 18   Temp: 98.4 F (36.9 C) 97.8 F (36.6 C) 97.8 F (36.6 C)   TempSrc: Oral Oral Oral   SpO2:  100% 99% 98%  Weight:      Height:        Intake/Output Summary (Last 24 hours) at 07/16/2018 1238 Last data filed at 07/16/2018 1228 Gross per 24 hour  Intake 1435.03 ml  Output 2750 ml  Net -1314.97 ml   Filed Weights   07/15/18 0103  Weight: 70.8 kg    Examination:  General exam: Appears calm and comfortable ,Not in distress HEENT:PERRL,Oral mucosa moist, Ear/Nose normal on gross exam Respiratory system: Bilateral equal air entry, normal vesicular breath sounds, no wheezes or crackles  Cardiovascular system: S1 & S2 heard, RRR. No JVD, murmurs, rubs, gallops or clicks. No pedal edema. Gastrointestinal system: Abdomen is nondistended, soft and nontender. No organomegaly or masses felt. Normal bowel sounds heard. Central nervous system: Alert and oriented. No focal neurological deficits. Extremities: No edema, no clubbing ,no cyanosis, distal peripheral  pulses palpable. Skin: No rashes, lesions or ulcers,no icterus ,no pallor MSK: Normal muscle bulk,tone ,power Psychiatry: Judgement and insight appear normal. Mood & affect appropriate.     Data Reviewed: I have personally reviewed following labs and imaging studies  CBC: Recent Labs  Lab 07/15/18 0104 07/15/18 1507 07/16/18 0447  WBC 11.6* 8.3 7.3  NEUTROABS  --   --  3.7  HGB 6.7* 8.2* 7.4*  HCT 18.8* 21.9* 20.7*  MCV 89.5 86.2  86.6  PLT 228 162 294   Basic Metabolic Panel: Recent Labs  Lab 07/15/18 0104 07/16/18 0447  NA 130* 134*  K 3.7 4.0  CL 98 104  CO2 24 24  GLUCOSE 148* 116*  BUN 37* 10  CREATININE 0.82 0.57  CALCIUM 9.4 8.2*   GFR: Estimated Creatinine Clearance: 64 mL/min (by C-G formula based on SCr of 0.57 mg/dL). Liver Function Tests: Recent Labs  Lab 07/15/18 0104  AST 16  ALT 16  ALKPHOS 64  BILITOT 0.5  PROT 5.0*  ALBUMIN 2.6*   No results for input(s): LIPASE, AMYLASE in the last 168 hours. No results for input(s): AMMONIA in the last 168 hours. Coagulation Profile: Recent Labs  Lab 07/15/18 0104  INR 1.16   Cardiac Enzymes: No results for input(s): CKTOTAL, CKMB, CKMBINDEX, TROPONINI in the last 168 hours. BNP (last 3 results) No results for input(s): PROBNP in the last 8760 hours. HbA1C: No results for input(s): HGBA1C in the last 72 hours. CBG: No results for input(s): GLUCAP in the last 168 hours. Lipid Profile: No results for input(s): CHOL, HDL, LDLCALC, TRIG, CHOLHDL, LDLDIRECT in the last 72 hours. Thyroid Function Tests: Recent Labs    07/15/18 1507 07/16/18 0810  TSH 0.176*  --   FREET4  --  1.67   Anemia Panel: No results for input(s): VITAMINB12, FOLATE, FERRITIN, TIBC, IRON, RETICCTPCT in the last 72 hours. Sepsis Labs: Recent Labs  Lab 07/15/18 0122 07/16/18 0447  LATICACIDVEN 2.6* 0.8    Recent Results (from the past 240 hour(s))  MRSA PCR Screening     Status: None   Collection Time: 07/15/18  8:25 PM  Result Value Ref Range Status   MRSA by PCR NEGATIVE NEGATIVE Final    Comment:        The GeneXpert MRSA Assay (FDA approved for NASAL specimens only), is one component of a comprehensive MRSA colonization surveillance program. It is not intended to diagnose MRSA infection nor to guide or monitor treatment for MRSA infections. Performed at [facility], [place] [address], [place], [place] [address]          Radiology  Studies: No results found.      Scheduled Meds: . levothyroxine  68.5 mcg Intravenous Daily  . nicotine  14 mg Transdermal Daily   Continuous Infusions: . pantoprozole (PROTONIX) infusion 8 mg/hr (07/16/18 1228)     LOS: 0 days    Time spent: 35 mins.More than 50% of that time was spent in counseling and/or coordination of care.      Shelly Coss, MD [facility] Pager [phone]  If 7PM-7AM, please contact night-coverage [email or web address] Password [facility] 07/16/2018, 12:38 PM

## 2018-07-16 NOTE — Anesthesia Procedure Notes (Signed)
Procedure Name: MAC Date/Time: 07/16/2018 11:18 AM Performed by: Eligha Bridegroom, CRNA Pre-anesthesia Checklist: Patient identified, Emergency Drugs available, Suction available, Patient being monitored and Timeout performed Patient Re-evaluated:Patient Re-evaluated prior to induction Oxygen Delivery Method: Nasal cannula Preoxygenation: Pre-oxygenation with 100% oxygen Induction Type: IV induction

## 2018-07-16 NOTE — Anesthesia Preprocedure Evaluation (Signed)
Anesthesia Evaluation  Patient identified by MRN, date of birth, ID band Patient awake    Reviewed: Allergy & Precautions, NPO status , Patient's Chart, lab work & pertinent test results  Airway Mallampati: II  TM Distance: >3 FB Neck ROM: Full    Dental  (+) Teeth Intact, Dental Advisory Given   Pulmonary Current Smoker,    Pulmonary exam normal breath sounds clear to auscultation       Cardiovascular hypertension, Pt. on medications (-) angina(-) Past MI Normal cardiovascular exam Rhythm:Regular Rate:Normal     Neuro/Psych PSYCHIATRIC DISORDERS negative neurological ROS     GI/Hepatic negative GI ROS, (+) Cirrhosis       ,   Endo/Other  Hypothyroidism   Renal/GU negative Renal ROS     Musculoskeletal negative musculoskeletal ROS (+)   Abdominal   Peds  Hematology negative hematology ROS (+)   Anesthesia Other Findings Day of surgery medications reviewed with the patient.  Reproductive/Obstetrics                             Anesthesia Physical Anesthesia Plan  ASA: III  Anesthesia Plan: MAC   Post-op Pain Management:    Induction: Intravenous  PONV Risk Score and Plan: 1 and Propofol infusion and Treatment may vary due to age or medical condition  Airway Management Planned: Nasal Cannula and Natural Airway  Additional Equipment:   Intra-op Plan:   Post-operative Plan:   Informed Consent: I have reviewed the patients History and Physical, chart, labs and discussed the procedure including the risks, benefits and alternatives for the proposed anesthesia with the patient or authorized representative who has indicated his/her understanding and acceptance.   Patient has DNR.  Discussed DNR with patient and Suspend DNR.   Dental advisory given  Plan Discussed with: CRNA and Anesthesiologist  Anesthesia Plan Comments:         Anesthesia Quick Evaluation

## 2018-07-16 NOTE — Progress Notes (Addendum)
Daily Rounding Note  07/16/2018, 10:12 AM  LOS: 0 days   SUBJECTIVE:   Chief complaint:  Melena, GIB, anemia.     No BM'S overnight, last dark stool was in the afternoon 1/21.  No nausea or vomiting, no abdominal pain.    OBJECTIVE:         Vital signs in last 24 hours:    Temp:  [98 F (36.7 C)-99.3 F (37.4 C)] 98.4 F (36.9 C) (01/22 0829) Pulse Rate:  [68-74] 68 (01/22 0744) Resp:  [15-22] 20 (01/22 0744) BP: (101-140)/(61-83) 139/75 (01/22 0829) SpO2:  [93 %-100 %] 95 % (01/22 0744) Last BM Date: 07/15/18 Filed Weights   07/15/18 0103  Weight: 70.8 kg   Spoke with pt as she was wheeling down hall to endo unit.  Exam was limited General: looks pale, weak/tired but alert.  Overall looks unwell.     Heart: RRR Chest: no labored breathing, fluid speach Abdomen:  Not examined Extremities: no edema Neuro/Psych:  Pleasant, cooperative, appropriate.    Intake/Output from previous day: 01/21 0701 - 01/22 0700 In: 1407.9 [I.V.:1092.9; Blood:315] Out: 2100 [Urine:2100]  Intake/Output this shift: Total I/O In: -  Out: 650 [Urine:650]  Lab Results: Recent Labs    07/15/18 0104 07/15/18 1507 07/16/18 0447  WBC 11.6* 8.3 7.3  HGB 6.7* 8.2* 7.4*  HCT 18.8* 21.9* 20.7*  PLT 228 162 155   BMET Recent Labs    07/15/18 0104 07/16/18 0447  NA 130* 134*  K 3.7 4.0  CL 98 104  CO2 24 24  GLUCOSE 148* 116*  BUN 37* 10  CREATININE 0.82 0.57  CALCIUM 9.4 8.2*   LFT Recent Labs    07/15/18 0104  PROT 5.0*  ALBUMIN 2.6*  AST 16  ALT 16  ALKPHOS 64  BILITOT 0.5   PT/INR Recent Labs    07/15/18 0104  LABPROT 14.7  INR 1.16   Hepatitis Panel Recent Labs    07/15/18 1507  HCVAB 0.2    Studies/Results: No results found.   Scheduled Meds: . levothyroxine  68.5 mcg Intravenous Daily  . nicotine  14 mg Transdermal Daily   Continuous Infusions: . octreotide  (SANDOSTATIN)    IV infusion  50 mcg/hr (07/16/18 0031)  . pantoprozole (PROTONIX) infusion 8 mg/hr (07/16/18 0032)   PRN Meds:.morphine injection   ASSESMENT:   *   GIB: melena.  no N/V.  Hx bleeds from gastric ulcers in 2004, 2007.  No home PPI. Suspect recurrent NSAID induced ulcer vs gastric varices/portal htn source of bleeding IV Protonix and Octreotide in place.    *   Cirrhosis of liver.  Due to ETOH.  Hep C negative.   Platelets and coags ok.    *    ABL anemia.  s/p 2 U PRBCs, Hgb 6.7 >> 7.4.    *    Hyponatremia.  Improved.    *    Hyperglycemia.  Improved.    *   Lactic acidosis, improved.      PLAN   *   EGD 11 AM today.      Azucena Freed  07/16/2018, 10:12 AM Phone [phone]   Attending physician's note   I have taken an interval history, reviewed the chart and examined the patient. I agree with the Advanced Practitioner's note, impression and recommendations.   The risks and benefits as well as alternatives of endoscopic procedure(s) have been discussed and reviewed. All  questions answered. The patient agrees to proceed.   Damaris Hippo , MD [phone]

## 2018-07-16 NOTE — Op Note (Addendum)
[facility] Patient Name: [name] Procedure Date : 07/16/2018 MRN: [id number] Attending MD: Mauri Pole , MD Date of Birth: 1950-07-07 CSN: [id number] Age: 68 Admit Type: Inpatient Procedure:                Upper GI endoscopy Indications:              Active gastrointestinal bleeding Providers:                Mauri Pole, MD, Carlyn Reichert, RN, Cherylynn Ridges, Technician, Eligha Bridegroom CRNA, CRNA Referring MD:              Medicines:                Monitored Anesthesia Care Complications:            No immediate complications. Estimated Blood Loss:     Estimated blood loss was minimal. Procedure:                Pre-Anesthesia Assessment:                           - Prior to the procedure, a History and Physical                            was performed, and patient medications and                            allergies were reviewed. The patient's tolerance of                            previous anesthesia was also reviewed. The risks                            and benefits of the procedure and the sedation                            options and risks were discussed with the patient.                            All questions were answered, and informed consent                            was obtained. Prior Anticoagulants: The patient has                            taken no previous anticoagulant or antiplatelet                            agents. ASA Grade Assessment: III - A patient with                            severe systemic disease. After reviewing the risks  and benefits, the patient was deemed in                            satisfactory condition to undergo the procedure.                           After obtaining informed consent, the endoscope was                            passed under direct vision. Throughout the                            procedure, the patient's blood pressure, pulse, and                             oxygen saturations were monitored continuously. The                            GIF-H190 ([id number]) Olympus gastroscope was                            introduced through the mouth, and advanced to the                            second part of duodenum. The upper GI endoscopy was                            accomplished without difficulty. The patient                            tolerated the procedure well. Scope In: Scope Out: Findings:      The Z-line was regular and was found 38 cm from the incisors.      No gross lesions were noted in the entire esophagus.      Patchy mild inflammation characterized by congestion (edema), erosions       and erythema was found in the gastric antrum and in the prepyloric       region of the stomach. Biopsies were taken with a cold forceps for       Helicobacter pylori testing.      Few cratered duodenal ulcers were found in the duodenal sweep and second       portion of the duodenum. The largest lesion was 15 mm in largest       dimension, partially obstructing, cratered with large adherent clot,       unable to dislodge clot with lavage. Impression:               - Z-line regular, 38 cm from the incisors.                           - No gross lesions in esophagus.                           - Gastritis. Biopsied.                           -  Multiple partially obstructing duodenal ulcers                            with adherent clot. Suspicious for NSAID induced                            etiology. Recommendation:           - Clear liquid diet today, then advance as                            tolerated to mechanical soft diet.                           - Give Protonix (pantoprazole): 8 mg/hr IV by                            continuous infusion for 72 hours followed by PPI                            twice daily.                           - No aspirin, ibuprofen, naproxen, or other                            non-steroidal  anti-inflammatory drugs.                           - Await pathology results. Procedure Code(s):        --- Professional ---                           [phone], Esophagogastroduodenoscopy, flexible,                            transoral; with biopsy, single or multiple Diagnosis Code(s):        --- Professional ---                           K29.70, Gastritis, unspecified, without bleeding                           K26.4, Chronic or unspecified duodenal ulcer with                            hemorrhage                           K92.2, Gastrointestinal hemorrhage, unspecified CPT copyright 2018 American [employer] Association. All rights reserved. The codes documented in this report are preliminary and upon coder review may  be revised to meet current compliance requirements. Mauri Pole, MD 07/16/2018 11:47:07 AM This report has been signed electronically. Number of Addenda: 0

## 2018-07-16 NOTE — Transfer of Care (Signed)
Immediate Anesthesia Transfer of Care Note  Patient: [name]  Procedure(s) Performed: ESOPHAGOGASTRODUODENOSCOPY (EGD) WITH PROPOFOL (N/A ) BIOPSY  Patient Location: PACU  Anesthesia Type:MAC  Level of Consciousness: awake, alert  and oriented  Airway & Oxygen Therapy: Patient Spontanous Breathing  Post-op Assessment: Report given to RN and Post -op Vital signs reviewed and stable  Post vital signs: Reviewed and stable  Last Vitals:  Vitals Value Taken Time  BP    Temp    Pulse 65 07/16/2018 11:44 AM  Resp 15 07/16/2018 11:44 AM  SpO2 100 % 07/16/2018 11:44 AM  Vitals shown include unvalidated device data.  Last Pain:  Vitals:   07/16/18 1140  TempSrc: Oral  PainSc:          Complications: No apparent anesthesia complications

## 2018-07-16 NOTE — Anesthesia Postprocedure Evaluation (Signed)
Anesthesia Post Note  Patient: [name]  Procedure(s) Performed: ESOPHAGOGASTRODUODENOSCOPY (EGD) WITH PROPOFOL (N/A ) BIOPSY     Patient location during evaluation: Endoscopy Anesthesia Type: MAC Level of consciousness: awake and alert Pain management: pain level controlled Vital Signs Assessment: post-procedure vital signs reviewed and stable Respiratory status: spontaneous breathing, nonlabored ventilation, respiratory function stable and patient connected to nasal cannula oxygen Cardiovascular status: stable and blood pressure returned to baseline Postop Assessment: no apparent nausea or vomiting Anesthetic complications: no    Last Vitals:  Vitals:   07/16/18 1150 07/16/18 1245  BP: (!) 144/66 127/70  Pulse: 62 63  Resp: 18 16  Temp:  36.6 C  SpO2: 98% 100%    Last Pain:  Vitals:   07/16/18 1245  TempSrc: Oral  PainSc: 0-No pain                 Catalina Gravel

## 2018-07-16 NOTE — Progress Notes (Signed)
Patient back from Endo, alert and oriented, no complaints of pain or other discomfort. VS stable. Will continue to monitor.

## 2018-07-17 ENCOUNTER — Ambulatory Visit (HOSPITAL_COMMUNITY): Admission: RE | Admit: 2018-07-17 | Payer: Medicare Other | Source: Ambulatory Visit

## 2018-07-17 ENCOUNTER — Inpatient Hospital Stay (HOSPITAL_COMMUNITY): Payer: Medicare Other

## 2018-07-17 DIAGNOSIS — K921 Melena: Secondary | ICD-10-CM

## 2018-07-17 LAB — CBC WITH DIFFERENTIAL/PLATELET
Abs Immature Granulocytes: 0.02 10*3/uL (ref 0.00–0.07)
Basophils Absolute: 0 10*3/uL (ref 0.0–0.1)
Basophils Relative: 1 %
EOS PCT: 8 %
Eosinophils Absolute: 0.5 10*3/uL (ref 0.0–0.5)
HCT: 21.4 % — ABNORMAL LOW (ref 36.0–46.0)
Hemoglobin: 7.6 g/dL — ABNORMAL LOW (ref 12.0–15.0)
Immature Granulocytes: 0 %
LYMPHS PCT: 27 %
Lymphs Abs: 1.7 10*3/uL (ref 0.7–4.0)
MCH: 30.8 pg (ref 26.0–34.0)
MCHC: 35.5 g/dL (ref 30.0–36.0)
MCV: 86.6 fL (ref 80.0–100.0)
Monocytes Absolute: 0.4 10*3/uL (ref 0.1–1.0)
Monocytes Relative: 7 %
Neutro Abs: 3.6 10*3/uL (ref 1.7–7.7)
Neutrophils Relative %: 57 %
Platelets: 172 10*3/uL (ref 150–400)
RBC: 2.47 MIL/uL — ABNORMAL LOW (ref 3.87–5.11)
RDW: 12.4 % (ref 11.5–15.5)
WBC: 6.2 10*3/uL (ref 4.0–10.5)
nRBC: 0 % (ref 0.0–0.2)

## 2018-07-17 LAB — T3, FREE: T3, Free: 2.2 pg/mL (ref 2.0–4.4)

## 2018-07-17 LAB — CLOTEST (H. PYLORI), BIOPSY: Helicobacter screen: NEGATIVE

## 2018-07-17 IMAGING — DX DG PELVIS 1-2V
1 series · 1 of 1 positions shown · non-contrast
Comparison: None.

CLINICAL DATA: 67-year-old female with history of left hip pain

EXAM:
PELVIS - 1-2 VIEW

[pelvis ap]
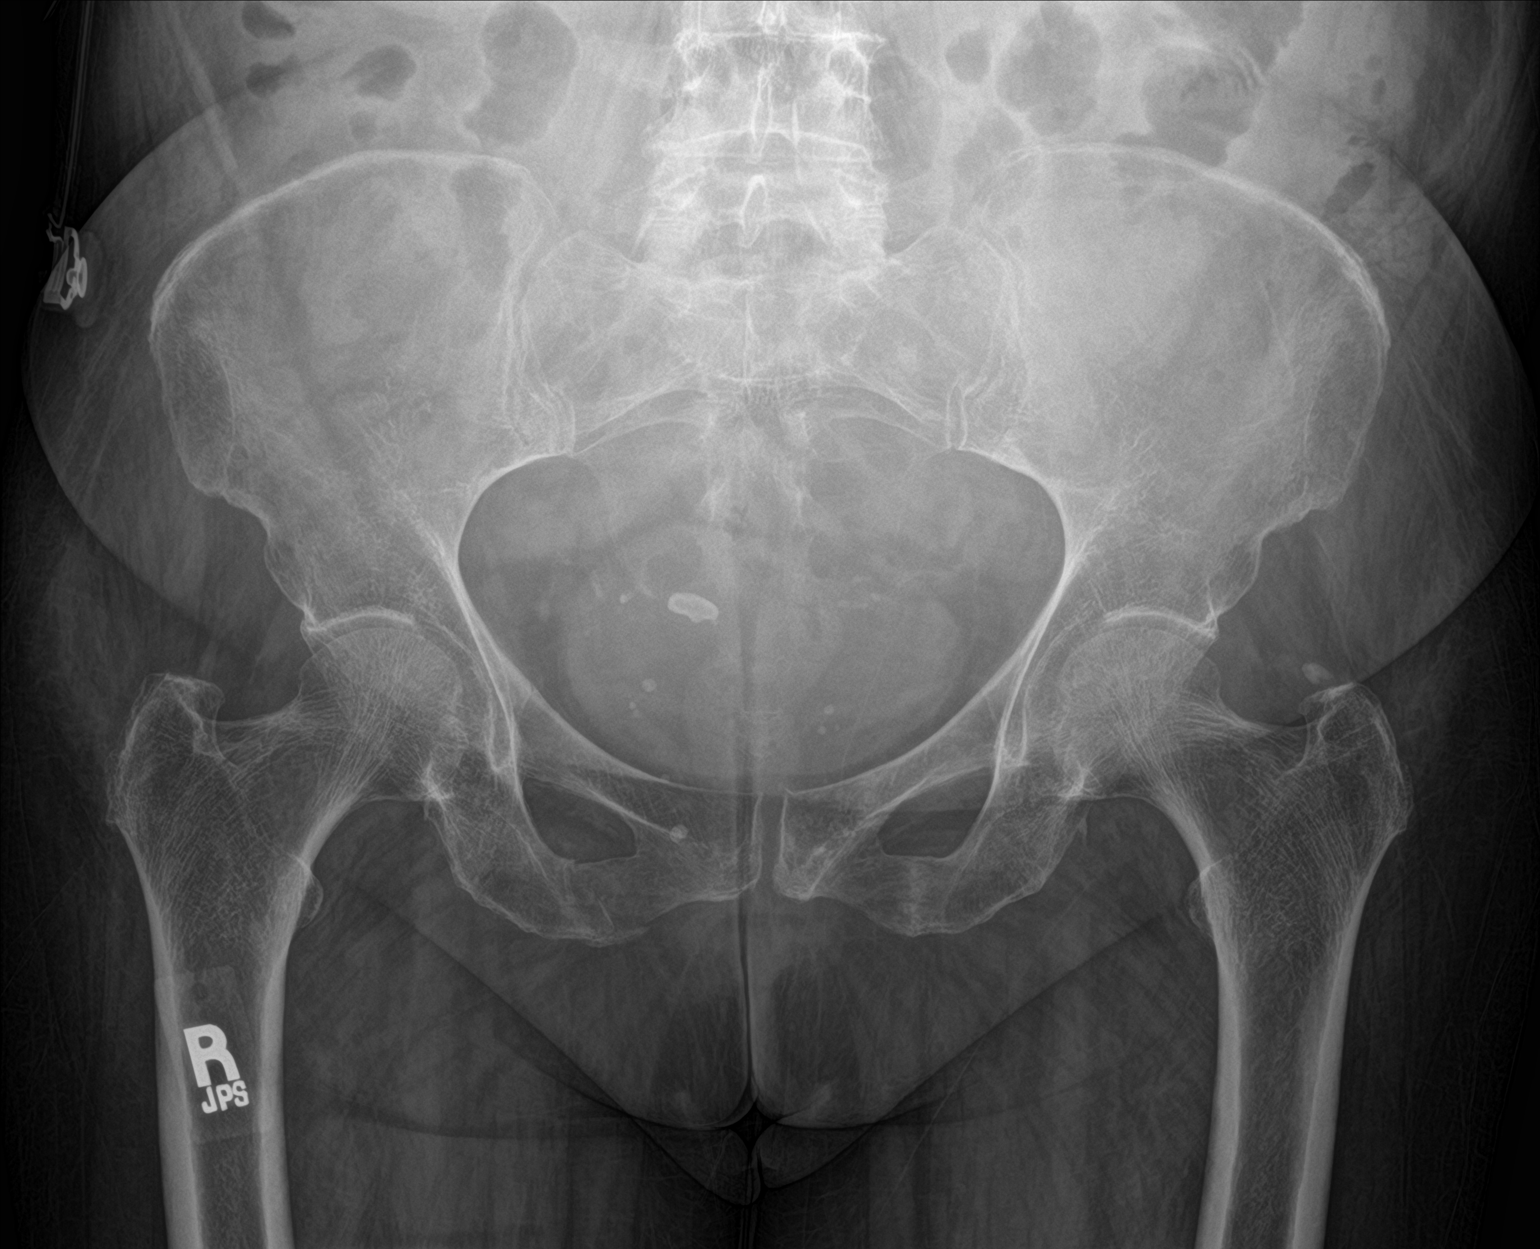

[1 of 1 positions shown; findings below may reference images not displayed]

FINDINGS: Nondisplaced fracture of the right superior and inferior pubic rami.

Osteopenia.

Bilateral hips projects normally over the acetabula. Unremarkable
appearance the proximal femurs. Degenerative changes of the
bilateral hips.

Pelvic calcifications.
IMPRESSION: Nondisplaced fracture of the right superior and inferior pubic rami.

## 2018-07-17 MED ORDER — PANTOPRAZOLE SODIUM 40 MG PO TBEC
40.0000 mg | DELAYED_RELEASE_TABLET | Freq: Two times a day (BID) | ORAL | Status: DC
  Administered 2018-07-18: 40 mg via ORAL
  Filled 2018-07-17: qty 1

## 2018-07-17 NOTE — Progress Notes (Addendum)
     [facility] Progress Note  CC:  Bleeding dulcer ulcer   Subjective: She is s/p EGD 07/16/2018 which showed patchy mild inflammation, erosions and congestion to the gastric antrum and in the prepyloric region, a few cratered duodenal ulcers identified, the largest lesion was 25mm, partially obstructing, cratered with a large adherent clot.  She reports feeling better today. Increased appetite. No abdominal pain. She passed a loose brown stool this morning. She complains of left hip/upper leg pain. Going for xray today.  Objective:  Vital signs in last 24 hours: Temp:  [97.7 F (36.5 C)-99.1 F (37.3 C)] 99.1 F (37.3 C) (01/23 0759) Pulse Rate:  [62-73] 73 (01/23 0759) Resp:  [16-24] 19 (01/23 0759) BP: (115-161)/(64-79) 141/79 (01/23 0759) SpO2:  [96 %-100 %] 96 % (01/23 0759) Last BM Date: 07/16/18 General:   Alert,  Well-developed,    in NAD Heart:  Regular rate and rhythm; no murmurs Pulm: reduced breath sounds throughout.  Abdomen:  Soft, nontender and nondistended. Normal bowel sounds, without guarding, and without rebound.   Extremities:  Without edema. Neurologic:  Alert and  oriented x4;  grossly normal neurologically. Psych:  Alert and cooperative. Normal mood and affect.  Intake/Output from previous day: 01/22 0701 - 01/23 0700 In: 489.7 [I.V.:489.7] Out: 1300 [Urine:1300] Intake/Output this shift: No intake/output data recorded.  Lab Results: Recent Labs    07/15/18 1507 07/16/18 0447 07/17/18 0405  WBC 8.3 7.3 6.2  HGB 8.2* 7.4* 7.6*  HCT 21.9* 20.7* 21.4*  PLT 162 155 172   BMET Recent Labs    07/15/18 0104 07/16/18 0447  NA 130* 134*  K 3.7 4.0  CL 98 104  CO2 24 24  GLUCOSE 148* 116*  BUN 37* 10  CREATININE 0.82 0.57  CALCIUM 9.4 8.2*   LFT Recent Labs    07/15/18 0104  PROT 5.0*  ALBUMIN 2.6*  AST 16  ALT 16  ALKPHOS 64  BILITOT 0.5   PT/INR Recent Labs    07/15/18 0104  LABPROT 14.7  INR 1.16   Hepatitis  Panel Recent Labs    07/15/18 1507  HCVAB 0.2     Assessment: 1. Upper GI Bleed/Duodenal ulcers secondary to NSAID use 2. Acute Anemia secondary to UGI bleeding. Hg 7.6 ( 7.4). HCT 21.4 (20.7) 3. Chronic pain, left hip. Using NSAIDS PTA  Plan: 1. Advance diet cab mod 2. PPI IV until 07/18/2018 then oral PPI BID 3. Repeat CBC 07/18/2018, await Clo test results. 4. Probable discharge home 07/18/2018 5. No NSAIDS.  6. Consider pain management post discharge.     LOS: 1 day   [name]  07/17/2018, 9:37 AM    Attending physician's note   I have taken an interval history, reviewed the chart and examined the patient. I agree with the Advanced Practitioner's note, impression and recommendations.   Continue PPI gtt for total 72 hours and then transition to oral PPI twice daily Avoid NSAID's (discussed in detail with patient) Repeat H&H in 1 week by PMD Oral iron sulphate 325mg  TID with meals Advance diet as tolerated Ok to discharge home tomorrow from GI standpoint Follow up in GI office as needed We will sign off, available if have any questions  K. Denzil Magnuson , MD [phone]

## 2018-07-17 NOTE — Progress Notes (Signed)
Patient reports having had constant pain/discomfort in her left thigh with a feeling of warmth on the inside for the last 4-5 years after breaking her right leg 4-5 years prior. Patient stated she has used multiple forms of various pain relievers to manage pain such as lidocaine patches, Tylenol and Ibuprofen. Patient stated would like an MRI/imaging done of her left leg to help identify source/cause of discomfort if possible. Will inform day nurse and encouraged patient to inform MD/care team during rounds.

## 2018-07-17 NOTE — Progress Notes (Signed)
PROGRESS NOTE    [name]  WUJ:[id number] DOB: [birth year] DOA: 07/15/2018 PCP: Merrilee Seashore, MD   Brief Narrative: Patient is a 68 year old female with past medical history of liver cirrhosis secondary to alcohol abuse, hypertension, hypothyroidism who presented to the hospital after multiple episodes of passage of black tarry stools.  Patient reported that she was taking ibuprofen 3 times a day for her chronic pain.  When she presented she was found to be hypotensive, found to be anemic with hemoglobin of 6.7 She was transfused with 2 units of PRBC.   She underwent upper GI endoscopy by GI .  Found to have duodenal ulcers  suspected to be from NSAIDs.  Plans to continue PPI drip for total of 72 hours.  Plan for discharge tomorrow to home on Protonix twice daily.  Assessment & Plan:   Principal Problem:   GI bleed Active Problems:   Leukocytosis   Hyponatremia   HTN (hypertension)   Hypothyroidism   Duodenal ulcer  Upper GI bleed: Presented with multiple episodes of melena.  Hypotensive on presentation.  Currently hemodynamically stable.  Transfused with PRBC.  Hemoglobin today 7.6 Underwent EGD on 07/16/17 by GI with finding of multiple ulcers in the duodenum suspected to be from NSAIDs.  Avoid NSAIDs, aspirin .Diet advanced to as tolerated.  Continue PPI drip for total of 72 hours. We will check CBC tomorrow.  Will switch Protonix to oral twice daily tomorrow  Pain on the left hip: Patient was complaining of pain on the left hip.  X-ray of the pelvis showed nondisplaced fracture of right superior and inferior pubic rami.  Will consult  physical therapy.  She was not  complaining of any pain on the right side  Leukocytosis: Reactive.  No indication for antibiotic therapy.  Hyponatremia: Improved with IV fluids.  Hypertension: Hypotensive on presentation.  Home antihypertensives held.  Currently blood pressure stable.  Hypothyroidism: Continue Synthyroid.           DVT prophylaxis:SCD Code Status: DNR Family Communication: None present at the bedside Disposition Plan: Home tomorrow   Consultants: GI  Procedures: EGD  Antimicrobials:  Anti-infectives (From admission, onward)   Start     Dose/Rate Route Frequency Ordered Stop   07/15/18 0900  cefTRIAXone (ROCEPHIN) 1 g in sodium chloride 0.9 % 100 mL IVPB  Status:  Discontinued     1 g 200 mL/hr over 30 Minutes Intravenous Daily 07/15/18 0738 07/15/18 0758      Subjective: Patient seen and examined at the bedside this morning .  She had a bowel movement with some blood last night.  Currently hemodynamically stable.  Did not complain of any abdominal pain. She was complaining of pain on her left hip.  X-ray showed fracture of the right sided superior and inferior pubic rami.  She is eager to go home.  Objective: Vitals:   07/16/18 2017 07/16/18 2052 07/16/18 2344 07/17/18 0759  BP:  (!) 152/68 115/72 (!) 141/79  Pulse:  67 72 73  Resp:  19 (!) 24 19  Temp: 97.8 F (36.6 C) 97.7 F (36.5 C) 98.4 F (36.9 C) 99.1 F (37.3 C)  TempSrc: Oral Oral Oral Oral  SpO2:  100% 96% 96%  Weight:      Height:        Intake/Output Summary (Last 24 hours) at 07/17/2018 1124 Last data filed at 07/16/2018 1903 Gross per 24 hour  Intake 405.43 ml  Output 650 ml  Net -244.57 ml   Danley Danker  Weights   07/15/18 0103  Weight: 70.8 kg    Examination:  General exam: Appears calm and comfortable ,Not in distress,average built HEENT:PERRL,Oral mucosa moist, Ear/Nose normal on gross exam Respiratory system: Bilateral equal air entry, normal vesicular breath sounds, no wheezes or crackles  Cardiovascular system: S1 & S2 heard, RRR. No JVD, murmurs, rubs, gallops or clicks. Gastrointestinal system: Abdomen is nondistended, soft and nontender. No organomegaly or masses felt. Normal bowel sounds heard. Central nervous system: Alert and oriented. No focal neurological deficits. Extremities: No edema, no  clubbing ,no cyanosis, distal peripheral pulses palpable. Skin: No rashes, lesions or ulcers,no icterus ,no pallor MSK: Normal muscle bulk,tone ,power Psychiatry: Judgement and insight appear normal. Mood & affect appropriate.     Data Reviewed: I have personally reviewed following labs and imaging studies  CBC: Recent Labs  Lab 07/15/18 0104 07/15/18 1507 07/16/18 0447 07/17/18 0405  WBC 11.6* 8.3 7.3 6.2  NEUTROABS  --   --  3.7 3.6  HGB 6.7* 8.2* 7.4* 7.6*  HCT 18.8* 21.9* 20.7* 21.4*  MCV 89.5 86.2 86.6 86.6  PLT 228 162 155 297   Basic Metabolic Panel: Recent Labs  Lab 07/15/18 0104 07/16/18 0447  NA 130* 134*  K 3.7 4.0  CL 98 104  CO2 24 24  GLUCOSE 148* 116*  BUN 37* 10  CREATININE 0.82 0.57  CALCIUM 9.4 8.2*   GFR: Estimated Creatinine Clearance: 64 mL/min (by C-G formula based on SCr of 0.57 mg/dL). Liver Function Tests: Recent Labs  Lab 07/15/18 0104  AST 16  ALT 16  ALKPHOS 64  BILITOT 0.5  PROT 5.0*  ALBUMIN 2.6*   No results for input(s): LIPASE, AMYLASE in the last 168 hours. No results for input(s): AMMONIA in the last 168 hours. Coagulation Profile: Recent Labs  Lab 07/15/18 0104  INR 1.16   Cardiac Enzymes: No results for input(s): CKTOTAL, CKMB, CKMBINDEX, TROPONINI in the last 168 hours. BNP (last 3 results) No results for input(s): PROBNP in the last 8760 hours. HbA1C: No results for input(s): HGBA1C in the last 72 hours. CBG: No results for input(s): GLUCAP in the last 168 hours. Lipid Profile: No results for input(s): CHOL, HDL, LDLCALC, TRIG, CHOLHDL, LDLDIRECT in the last 72 hours. Thyroid Function Tests: Recent Labs    07/15/18 1507 07/16/18 0810  TSH 0.176*  --   FREET4  --  1.67  T3FREE  --  2.2   Anemia Panel: No results for input(s): VITAMINB12, FOLATE, FERRITIN, TIBC, IRON, RETICCTPCT in the last 72 hours. Sepsis Labs: Recent Labs  Lab 07/15/18 0122 07/16/18 0447  LATICACIDVEN 2.6* 0.8    Recent  Results (from the past 240 hour(s))  MRSA PCR Screening     Status: None   Collection Time: 07/15/18  8:25 PM  Result Value Ref Range Status   MRSA by PCR NEGATIVE NEGATIVE Final    Comment:        The GeneXpert MRSA Assay (FDA approved for NASAL specimens only), is one component of a comprehensive MRSA colonization surveillance program. It is not intended to diagnose MRSA infection nor to guide or monitor treatment for MRSA infections. Performed at [facility], [place] [address], [place], [place] [address]          Radiology Studies: Dg Pelvis 1-2 Views  Result Date: 07/17/2018 CLINICAL DATA:  68 year old female with history of left hip pain EXAM: PELVIS - 1-2 VIEW COMPARISON:  None. FINDINGS: Nondisplaced fracture of the right superior and inferior  pubic rami. Osteopenia. Bilateral hips projects normally over the acetabula. Unremarkable appearance the proximal femurs. Degenerative changes of the bilateral hips. Pelvic calcifications. IMPRESSION: Nondisplaced fracture of the right superior and inferior pubic rami. Electronically Signed   By: Corrie Mckusick D.O.   On: 07/17/2018 10:11        Scheduled Meds: . levothyroxine  137 mcg Oral Q0600  . nicotine  14 mg Transdermal Daily  . [START ON 07/18/2018] pantoprazole  40 mg Oral BID   Continuous Infusions: . pantoprozole (PROTONIX) infusion 8 mg/hr (07/17/18 0940)     LOS: 1 day    Time spent: 25 mins.More than 50% of that time was spent in counseling and/or coordination of care.      Shelly Coss, MD [facility] Pager [phone]  If 7PM-7AM, please contact night-coverage [email or web address] Password [facility] 07/17/2018, 11:24 AM

## 2018-07-17 NOTE — Evaluation (Addendum)
Physical Therapy Evaluation Patient Details Name: [name] MRN: [id number] DOB: February 22, 1951 Today's Date: 07/17/2018   History of Present Illness  Patient is a 68 year old female with past medical history of liver cirrhosis secondary to alcohol abuse, hypertension, hypothyroidism who presented to the hospital after multiple episodes of passage of black tarry stools. Patient reported that she was taking ibuprofen 3 times a day for her chronic pain.When she presented she was found to be hypotensive, found to be anemic with hemoglobin of 6.7  Clinical Impression  Pt admitted with above diagnosis. Pt currently with functional limitations due to the deficits listed below (see PT Problem List). Pt was able to ambulate without device with min guard assist but could not withstand challenges without LOB.  Recommend use of RW at home.  Pt agrees. Of note, pt states she is walking at her baseline.  Declines HH and states she will have 24 hour care.  Willl follow acutely to address balance deficits.   Pt will benefit from skilled PT to increase their independence and safety with mobility to allow discharge to the venue listed below.     Follow Up Recommendations No PT follow up;Supervision/Assistance - 24 hour(Pt declines HH services)    Equipment Recommendations  None recommended by PT    Recommendations for Other Services       Precautions / Restrictions Precautions Precautions: Fall Restrictions Weight Bearing Restrictions: No      Mobility  Bed Mobility Overal bed mobility: Needs Assistance Bed Mobility: Supine to Sit     Supine to sit: Supervision        Transfers Overall transfer level: Needs assistance   Transfers: Sit to/from Stand Sit to Stand: Supervision            Ambulation/Gait Ambulation/Gait assistance: Min guard Gait Distance (Feet): 280 Feet Assistive device: None Gait Pattern/deviations: Step-through pattern;Decreased stride length;Antalgic    Gait velocity interpretation: <1.31 ft/sec, indicative of household ambulator General Gait Details: Pt was able to ambulate without device with min guard assist.  Pt states she is at her baseline gait.  cannot accept more than min challenges to balance. REcommend a RW for ultimate safety and pt is aware.   Stairs            Wheelchair Mobility    Modified Rankin (Stroke Patients Only)       Balance Overall balance assessment: Needs assistance Sitting-balance support: No upper extremity supported;Feet supported Sitting balance-Leahy Scale: Fair     Standing balance support: No upper extremity supported;During functional activity Standing balance-Leahy Scale: Fair                   Standardized Balance Assessment Standardized Balance Assessment : Dynamic Gait Index   Dynamic Gait Index Level Surface: Mild Impairment Change in Gait Speed: Mild Impairment Gait with Horizontal Head Turns: Mild Impairment Gait with Vertical Head Turns: Normal Gait and Pivot Turn: Mild Impairment Step Over Obstacle: Moderate Impairment Step Around Obstacles: Normal Steps: Moderate Impairment Total Score: 16       Pertinent Vitals/Pain Pain Assessment: No/denies pain    Home Living Family/patient expects to be discharged to:: Private residence Living Arrangements: Children Available Help at Discharge: Family;Available 24 hours/day Type of Home: House Home Access: Stairs to enter Entrance Stairs-Rails: Psychiatric nurse of Steps: 3 Home Layout: One level Home Equipment: Cane - single point;Walker - 2 wheels;Shower seat;Wheelchair - manual;Grab bars - tub/shower      Prior Function Level of Independence: Independent with  assistive device(s);Needs assistance   Gait / Transfers Assistance Needed: was using RW and cane because she had been sick and did have 1 fall  ADL's / Homemaking Assistance Needed: B/D self  Comments: Does not drive     Hand  Dominance   Dominant Hand: (ambidextrous)    Extremity/Trunk Assessment   Upper Extremity Assessment Upper Extremity Assessment: Defer to OT evaluation    Lower Extremity Assessment Lower Extremity Assessment: Generalized weakness       Communication   Communication: No difficulties  Cognition Arousal/Alertness: Awake/alert Behavior During Therapy: WFL for tasks assessed/performed Overall Cognitive Status: Within Functional Limits for tasks assessed                                        General Comments General comments (skin integrity, edema, etc.): Pt scored 16/24 on DGI suggesting at risk for falls therefore reocmmend pt use RW at all times and pt agrees.     Exercises     Assessment/Plan    PT Assessment Patient needs continued PT services  PT Problem List Decreased activity tolerance;Decreased balance;Decreased mobility;Decreased knowledge of use of DME;Decreased safety awareness;Decreased knowledge of precautions       PT Treatment Interventions DME instruction;Gait training;Functional mobility training;Therapeutic activities;Therapeutic exercise;Balance training;Patient/family education    PT Goals (Current goals can be found in the Care Plan section)  Acute Rehab PT Goals Patient Stated Goal: to go home PT Goal Formulation: With patient Time For Goal Achievement: 07/31/18 Potential to Achieve Goals: Good    Frequency Min 3X/week   Barriers to discharge        Co-evaluation               AM-PAC PT "6 Clicks" Mobility  Outcome Measure Help needed turning from your back to your side while in a flat bed without using bedrails?: None Help needed moving from lying on your back to sitting on the side of a flat bed without using bedrails?: None Help needed moving to and from a bed to a chair (including a wheelchair)?: None Help needed standing up from a chair using your arms (e.g., wheelchair or bedside chair)?: None Help needed to walk  in hospital room?: A Little Help needed climbing 3-5 steps with a railing? : A Little 6 Click Score: 22    End of Session Equipment Utilized During Treatment: Gait belt Activity Tolerance: Patient limited by fatigue Patient left: with call bell/phone within reach;in bed Nurse Communication: Mobility status PT Visit Diagnosis: Unsteadiness on feet (R26.81);Muscle weakness (generalized) (M62.81)    Time: 6606-3016 PT Time Calculation (min) (ACUTE ONLY): 13 min   Charges:   PT Evaluation $PT Eval Moderate Complexity: [place] Pager:  [phone]  Office:  [phone]    Denice Paradise 07/17/2018, 1:23 PM

## 2018-07-18 DIAGNOSIS — E039 Hypothyroidism, unspecified: Secondary | ICD-10-CM

## 2018-07-18 DIAGNOSIS — I159 Secondary hypertension, unspecified: Secondary | ICD-10-CM

## 2018-07-18 DIAGNOSIS — K269 Duodenal ulcer, unspecified as acute or chronic, without hemorrhage or perforation: Secondary | ICD-10-CM

## 2018-07-18 DIAGNOSIS — K264 Chronic or unspecified duodenal ulcer with hemorrhage: Principal | ICD-10-CM

## 2018-07-18 LAB — CBC
HCT: 20.1 % — ABNORMAL LOW (ref 36.0–46.0)
Hemoglobin: 7.5 g/dL — ABNORMAL LOW (ref 12.0–15.0)
MCH: 32.2 pg (ref 26.0–34.0)
MCHC: 37.3 g/dL — ABNORMAL HIGH (ref 30.0–36.0)
MCV: 86.3 fL (ref 80.0–100.0)
Platelets: 162 10*3/uL (ref 150–400)
RBC: 2.33 MIL/uL — ABNORMAL LOW (ref 3.87–5.11)
RDW: 12.5 % (ref 11.5–15.5)
WBC: 6.7 10*3/uL (ref 4.0–10.5)
nRBC: 0 % (ref 0.0–0.2)

## 2018-07-18 MED ORDER — POLYETHYLENE GLYCOL 3350 17 G PO PACK: 17.0000 g | PACK | Freq: Every day | ORAL | 0 refills | Status: DC | PRN

## 2018-07-18 MED ORDER — PANTOPRAZOLE SODIUM 40 MG PO TBEC: 40.0000 mg | DELAYED_RELEASE_TABLET | Freq: Two times a day (BID) | ORAL | 0 refills | Status: DC

## 2018-07-18 MED ORDER — FERROUS SULFATE 325 (65 FE) MG PO TBEC: 325.0000 mg | DELAYED_RELEASE_TABLET | Freq: Three times a day (TID) | ORAL | 0 refills | Status: DC

## 2018-07-18 MED ORDER — SENNOSIDES-DOCUSATE SODIUM 8.6-50 MG PO TABS: 2.0000 | ORAL_TABLET | Freq: Every evening | ORAL | 0 refills | Status: AC | PRN

## 2018-07-18 MED ORDER — TRAMADOL HCL 50 MG PO TABS: 50.0000 mg | ORAL_TABLET | Freq: Two times a day (BID) | ORAL | 0 refills | Status: DC | PRN

## 2018-07-18 NOTE — Discharge Summary (Signed)
Physician Discharge Summary  [name] GXQ:[id number] DOB: 1950-12-20 DOA: 07/15/2018  PCP: Merrilee Seashore, MD  Admit date: 07/15/2018 Discharge date: 07/18/2018  Admitted From: Home Disposition: Home  Recommendations for Outpatient Follow-up:  1. Follow up with PCP in 1-2 weeks 2. Please obtain BMP/CBC in one week your next doctors visit.  3. Advised to take oral Protonix twice daily before meals for at least 30 days and follow-up outpatient 4. Follow-up outpatient gastroneurology as needed.   Discharge Condition: Stable CODE STATUS: DNR Diet recommendation: 2 g salt diet  Brief/Interim Summary: 68 year old with history of liver cirrhosis due to alcohol abuse, essential hypertension, hypothyroidism came to the hospital with complaints of melanotic stool.  Patient has been on ibuprofen 3 times daily for chronic pain.  She was found to be hypotensive with hemoglobin of 6.7.  She received 2 units of PRBC and underwent upper endoscopy which showed duodenal ulcer.  Patient was started on Protonix drip which was continued for 72 hours and transition to Protonix twice daily.  Patient's bleeding has subsided and hemoglobin now remained stable.  Wishes to go home therefore will discharge with outpatient follow-up recommendations as stated above.   Discharge Diagnoses:  Principal Problem:   GI bleed Active Problems:   Leukocytosis   Hyponatremia   HTN (hypertension)   Hypothyroidism   Duodenal ulcer  Upper GI bleeding secondary to duodenal ulcer from NSAID use -Status post EGD on 1/22 showing multiple duodenal ulcer secondary to NSAID use.  Advised to stop using any NSAIDs.  Status post 3 days of PPI drip.  Now we will transition to Protonix twice daily for 30 days and follow-up outpatient with GI as needed.  She also needs to follow-up outpatient primary care provider in 1 week and get repeat lab work at that time.  Chronic left hip pain -X-ray here showed nondisplaced  fracture of the right superior inferior rami.  Physical therapy recommended no further follow-up at this time.  Pain control with use of tramadol.  Advised to avoid using NSAIDs.  Essential hypertension -Resume home medication  Hypothyroidism -Continue home Synthroid  On SCDs during the hospitalization She is DNR Discharge today in stable condition.  Discharge Instructions   Allergies as of 07/18/2018   No Active Allergies     Medication List    STOP taking these medications   ibuprofen 200 MG tablet Commonly known as:  ADVIL,MOTRIN   lisinopril 20 MG tablet Commonly known as:  PRINIVIL,ZESTRIL     TAKE these medications   amLODipine 10 MG tablet Commonly known as:  NORVASC Take 1 tablet (10 mg total) by mouth daily.   BEN GAY EX Apply 1 application topically as needed (leg pain).   BILBERRY FRUIT PO Take 1 tablet by mouth daily.   citalopram 20 MG tablet Commonly known as:  CELEXA TAKE 1 TABLET BY MOUTH EVERY DAY.   ferrous sulfate 325 (65 FE) MG EC tablet Take 1 tablet (325 mg total) by mouth 3 (three) times daily with meals for 30 days. What changed:  when to take this   FISH OIL CONCENTRATE 1000 MG Caps Take 1,000 mg by mouth daily.   FLAX SEED OIL PO Take 1 tablet by mouth daily.   levothyroxine 137 MCG tablet Commonly known as:  SYNTHROID, LEVOTHROID Take 137 mcg by mouth daily.   lisinopril-hydrochlorothiazide 20-25 MG tablet Commonly known as:  PRINZIDE,ZESTORETIC Take 1 tablet by mouth daily.   MULTIVITAMIN & MINERAL PO Take 1 tablet by mouth daily.  omega-3 acid ethyl esters 1 g capsule Commonly known as:  LOVAZA Take 1 g by mouth daily.   pantoprazole 40 MG tablet Commonly known as:  PROTONIX Take 1 tablet (40 mg total) by mouth 2 (two) times daily before a meal for 30 days.   polyethylene glycol packet Commonly known as:  MIRALAX Take 17 g by mouth daily as needed for moderate constipation or severe constipation.    senna-docusate 8.6-50 MG tablet Commonly known as:  Senokot-S Take 2 tablets by mouth at bedtime as needed for up to 30 days for mild constipation or moderate constipation.   TIGER BALM MUSCLE RUB 08-27-13 % Crea Generic drug:  Camphor-Menthol-Methyl Sal Apply 1 application topically as needed (leg pain).   traMADol 50 MG tablet Commonly known as:  ULTRAM Take 1 tablet (50 mg total) by mouth every 12 (twelve) hours as needed for up to 30 doses for severe pain.   vitamin B-12 1000 MCG tablet Commonly known as:  CYANOCOBALAMIN Take 1,000 mcg by mouth daily.   vitamin C 100 MG tablet Take 100 mg by mouth daily.      Follow-up Information    Merrilee Seashore, MD. Schedule an appointment as soon as possible for a visit in 1 week(s).   Specialty:  Internal Medicine Contact information: [place] [address] [phone]          No Active Allergies  You were cared for by a hospitalist during your hospital stay. If you have any questions about your discharge medications or the care you received while you were in the hospital after you are discharged, you can call the unit and asked to speak with the hospitalist on call if the hospitalist that took care of you is not available. Once you are discharged, your primary care physician will handle any further medical issues. Please note that no refills for any discharge medications will be authorized once you are discharged, as it is imperative that you return to your primary care physician (or establish a relationship with a primary care physician if you do not have one) for your aftercare needs so that they can reassess your need for medications and monitor your lab values.  Consultations:  Gastroenterology   Procedures/Studies: Dg Pelvis 1-2 Views  Result Date: 07/17/2018 CLINICAL DATA:  68 year old female with history of left hip pain EXAM: PELVIS - 1-2 VIEW COMPARISON:  None. FINDINGS:  Nondisplaced fracture of the right superior and inferior pubic rami. Osteopenia. Bilateral hips projects normally over the acetabula. Unremarkable appearance the proximal femurs. Degenerative changes of the bilateral hips. Pelvic calcifications. IMPRESSION: Nondisplaced fracture of the right superior and inferior pubic rami. Electronically Signed   By: Corrie Mckusick D.O.   On: 07/17/2018 10:11      Subjective: No complaints, feels better.  No further bleeding noted.  Wishes to go home.   Discharge Exam: Vitals:   07/18/18 0419 07/18/18 0800  BP:    Pulse:    Resp:    Temp: 98.6 F (37 C) 98.4 F (36.9 C)  SpO2:     Vitals:   07/17/18 1709 07/17/18 2014 07/18/18 0419 07/18/18 0800  BP:      Pulse: 67     Resp: 20     Temp:  98.5 F (36.9 C) 98.6 F (37 C) 98.4 F (36.9 C)  TempSrc:  Oral Oral Oral  SpO2: 99%     Weight:      Height:  General: Pt is alert, awake, not in acute distress Cardiovascular: RRR, S1/S2 +, no rubs, no gallops Respiratory: CTA bilaterally, no wheezing, no rhonchi Abdominal: Soft, NT, ND, bowel sounds + Extremities: no edema, no cyanosis    The results of significant diagnostics from this hospitalization (including imaging, microbiology, ancillary and laboratory) are listed below for reference.     Microbiology: Recent Results (from the past 240 hour(s))  MRSA PCR Screening     Status: None   Collection Time: 07/15/18  8:25 PM  Result Value Ref Range Status   MRSA by PCR NEGATIVE NEGATIVE Final    Comment:        The GeneXpert MRSA Assay (FDA approved for NASAL specimens only), is one component of a comprehensive MRSA colonization surveillance program. It is not intended to diagnose MRSA infection nor to guide or monitor treatment for MRSA infections. Performed at [facility], [place] [address], [place], [place] [address]      Labs: BNP (last 3 results) No results for input(s): BNP in the last 8760 hours. Basic  Metabolic Panel: Recent Labs  Lab 07/15/18 0104 07/16/18 0447  NA 130* 134*  K 3.7 4.0  CL 98 104  CO2 24 24  GLUCOSE 148* 116*  BUN 37* 10  CREATININE 0.82 0.57  CALCIUM 9.4 8.2*   Liver Function Tests: Recent Labs  Lab 07/15/18 0104  AST 16  ALT 16  ALKPHOS 64  BILITOT 0.5  PROT 5.0*  ALBUMIN 2.6*   No results for input(s): LIPASE, AMYLASE in the last 168 hours. No results for input(s): AMMONIA in the last 168 hours. CBC: Recent Labs  Lab 07/15/18 0104 07/15/18 1507 07/16/18 0447 07/17/18 0405 07/18/18 0436  WBC 11.6* 8.3 7.3 6.2 6.7  NEUTROABS  --   --  3.7 3.6  --   HGB 6.7* 8.2* 7.4* 7.6* 7.5*  HCT 18.8* 21.9* 20.7* 21.4* 20.1*  MCV 89.5 86.2 86.6 86.6 86.3  PLT 228 162 155 172 162   Cardiac Enzymes: No results for input(s): CKTOTAL, CKMB, CKMBINDEX, TROPONINI in the last 168 hours. BNP: Invalid input(s): POCBNP CBG: No results for input(s): GLUCAP in the last 168 hours. D-Dimer No results for input(s): DDIMER in the last 72 hours. Hgb A1c No results for input(s): HGBA1C in the last 72 hours. Lipid Profile No results for input(s): CHOL, HDL, LDLCALC, TRIG, CHOLHDL, LDLDIRECT in the last 72 hours. Thyroid function studies Recent Labs    07/15/18 1507 07/16/18 0810  TSH 0.176*  --   T3FREE  --  2.2   Anemia work up No results for input(s): VITAMINB12, FOLATE, FERRITIN, TIBC, IRON, RETICCTPCT in the last 72 hours. Urinalysis    Component Value Date/Time   COLORURINE STRAW (A) 07/15/2018 2200   APPEARANCEUR CLEAR 07/15/2018 2200   LABSPEC 1.009 07/15/2018 2200   PHURINE 6.0 07/15/2018 2200   GLUCOSEU 50 (A) 07/15/2018 2200   HGBUR NEGATIVE 07/15/2018 2200   BILIRUBINUR NEGATIVE 07/15/2018 2200   KETONESUR NEGATIVE 07/15/2018 2200   PROTEINUR NEGATIVE 07/15/2018 2200   NITRITE NEGATIVE 07/15/2018 2200   LEUKOCYTESUR NEGATIVE 07/15/2018 2200   Sepsis Labs Invalid input(s): PROCALCITONIN,  WBC,  LACTICIDVEN Microbiology Recent Results  (from the past 240 hour(s))  MRSA PCR Screening     Status: None   Collection Time: 07/15/18  8:25 PM  Result Value Ref Range Status   MRSA by PCR NEGATIVE NEGATIVE Final    Comment:        The GeneXpert MRSA Assay (  FDA approved for NASAL specimens only), is one component of a comprehensive MRSA colonization surveillance program. It is not intended to diagnose MRSA infection nor to guide or monitor treatment for MRSA infections. Performed at [facility], [place] [address] [place], [place] [address]      Time coordinating discharge:  I have spent 35 minutes face to face with the patient and on the ward discussing the patients care, assessment, plan and disposition with other care givers. >50% of the time was devoted counseling the patient about the risks and benefits of treatment/Discharge disposition and coordinating care.   SIGNED:   Damita Lack, MD  [facility] 07/18/2018, 12:01 PM   If 7PM-7AM, please contact night-coverage [email or web address]

## 2018-07-18 NOTE — Care Management Important Message (Signed)
Important Message  Patient Details  Name: [name] MRN: [id number] Date of Birth: 02-Mar-1951   Medicare Important Message Given:  Yes    Gurkaran Rahm P [place] 07/18/2018, 11:28 AM

## 2018-07-29 ENCOUNTER — Encounter (HOSPITAL_COMMUNITY): Payer: Medicare Other

## 2018-08-01 ENCOUNTER — Inpatient Hospital Stay: Admission: RE | Admit: 2018-08-01 | Payer: Medicare Other | Source: Ambulatory Visit

## 2018-08-05 ENCOUNTER — Other Ambulatory Visit: Payer: Self-pay | Admitting: Family Medicine

## 2018-08-05 DIAGNOSIS — R52 Pain, unspecified: Secondary | ICD-10-CM

## 2018-08-05 DIAGNOSIS — E2839 Other primary ovarian failure: Secondary | ICD-10-CM

## 2018-09-30 ENCOUNTER — Other Ambulatory Visit: Payer: Medicare Other

## 2018-12-01 ENCOUNTER — Other Ambulatory Visit: Payer: Self-pay | Admitting: Family Medicine

## 2018-12-01 DIAGNOSIS — Z780 Asymptomatic menopausal state: Secondary | ICD-10-CM

## 2018-12-03 ENCOUNTER — Other Ambulatory Visit: Payer: Medicare Other

## 2019-02-17 ENCOUNTER — Other Ambulatory Visit: Payer: Medicare Other

## 2020-05-03 NOTE — Progress Notes (Signed)
No show

## 2020-05-04 ENCOUNTER — Ambulatory Visit: Payer: Self-pay | Admitting: Cardiology

## 2020-05-04 DIAGNOSIS — I499 Cardiac arrhythmia, unspecified: Secondary | ICD-10-CM | POA: Insufficient documentation

## 2020-05-05 ENCOUNTER — Other Ambulatory Visit: Payer: Self-pay | Admitting: Family Medicine

## 2020-05-09 ENCOUNTER — Ambulatory Visit: Payer: Self-pay | Admitting: Cardiology

## 2020-10-08 ENCOUNTER — Other Ambulatory Visit: Payer: Self-pay

## 2020-10-08 ENCOUNTER — Encounter (HOSPITAL_COMMUNITY): Payer: Self-pay | Admitting: Emergency Medicine

## 2020-10-08 ENCOUNTER — Inpatient Hospital Stay (HOSPITAL_COMMUNITY)
Admission: EM | Admit: 2020-10-08 | Discharge: 2020-10-18 | DRG: 515 | Disposition: A | Discharge status: Home Health | Payer: Medicare Other | Attending: Family Medicine | Admitting: Family Medicine

## 2020-10-08 ENCOUNTER — Emergency Department (HOSPITAL_COMMUNITY): Payer: Medicare Other

## 2020-10-08 DIAGNOSIS — R109 Unspecified abdominal pain: Secondary | ICD-10-CM

## 2020-10-08 DIAGNOSIS — J449 Chronic obstructive pulmonary disease, unspecified: Secondary | ICD-10-CM | POA: Diagnosis present

## 2020-10-08 DIAGNOSIS — G9341 Metabolic encephalopathy: Secondary | ICD-10-CM | POA: Diagnosis not present

## 2020-10-08 DIAGNOSIS — Z66 Do not resuscitate: Secondary | ICD-10-CM | POA: Diagnosis not present

## 2020-10-08 DIAGNOSIS — K219 Gastro-esophageal reflux disease without esophagitis: Secondary | ICD-10-CM | POA: Diagnosis present

## 2020-10-08 DIAGNOSIS — F05 Delirium due to known physiological condition: Secondary | ICD-10-CM | POA: Diagnosis present

## 2020-10-08 DIAGNOSIS — S22088A Other fracture of T11-T12 vertebra, initial encounter for closed fracture: Secondary | ICD-10-CM | POA: Diagnosis present

## 2020-10-08 DIAGNOSIS — S22000A Wedge compression fracture of unspecified thoracic vertebra, initial encounter for closed fracture: Secondary | ICD-10-CM | POA: Diagnosis not present

## 2020-10-08 DIAGNOSIS — I4819 Other persistent atrial fibrillation: Secondary | ICD-10-CM | POA: Diagnosis present

## 2020-10-08 DIAGNOSIS — S32018A Other fracture of first lumbar vertebra, initial encounter for closed fracture: Secondary | ICD-10-CM | POA: Diagnosis present

## 2020-10-08 DIAGNOSIS — I10 Essential (primary) hypertension: Secondary | ICD-10-CM | POA: Diagnosis present

## 2020-10-08 DIAGNOSIS — R739 Hyperglycemia, unspecified: Secondary | ICD-10-CM | POA: Diagnosis not present

## 2020-10-08 DIAGNOSIS — E871 Hypo-osmolality and hyponatremia: Secondary | ICD-10-CM | POA: Diagnosis present

## 2020-10-08 DIAGNOSIS — E861 Hypovolemia: Secondary | ICD-10-CM | POA: Diagnosis present

## 2020-10-08 DIAGNOSIS — E039 Hypothyroidism, unspecified: Secondary | ICD-10-CM | POA: Diagnosis present

## 2020-10-08 DIAGNOSIS — Z923 Personal history of irradiation: Secondary | ICD-10-CM

## 2020-10-08 DIAGNOSIS — F17210 Nicotine dependence, cigarettes, uncomplicated: Secondary | ICD-10-CM | POA: Diagnosis present

## 2020-10-08 DIAGNOSIS — W19XXXA Unspecified fall, initial encounter: Secondary | ICD-10-CM | POA: Diagnosis present

## 2020-10-08 DIAGNOSIS — Z9049 Acquired absence of other specified parts of digestive tract: Secondary | ICD-10-CM

## 2020-10-08 DIAGNOSIS — Z79890 Hormone replacement therapy (postmenopausal): Secondary | ICD-10-CM | POA: Diagnosis not present

## 2020-10-08 DIAGNOSIS — R0603 Acute respiratory distress: Secondary | ICD-10-CM | POA: Diagnosis present

## 2020-10-08 DIAGNOSIS — M545 Low back pain, unspecified: Secondary | ICD-10-CM | POA: Diagnosis present

## 2020-10-08 DIAGNOSIS — Z20822 Contact with and (suspected) exposure to covid-19: Secondary | ICD-10-CM | POA: Diagnosis present

## 2020-10-08 DIAGNOSIS — E876 Hypokalemia: Secondary | ICD-10-CM | POA: Diagnosis present

## 2020-10-08 DIAGNOSIS — S22078A Other fracture of T9-T10 vertebra, initial encounter for closed fracture: Secondary | ICD-10-CM | POA: Diagnosis present

## 2020-10-08 DIAGNOSIS — E785 Hyperlipidemia, unspecified: Secondary | ICD-10-CM | POA: Diagnosis present

## 2020-10-08 DIAGNOSIS — Z8719 Personal history of other diseases of the digestive system: Secondary | ICD-10-CM | POA: Diagnosis not present

## 2020-10-08 DIAGNOSIS — E8809 Other disorders of plasma-protein metabolism, not elsewhere classified: Secondary | ICD-10-CM | POA: Diagnosis present

## 2020-10-08 DIAGNOSIS — R0902 Hypoxemia: Secondary | ICD-10-CM | POA: Diagnosis present

## 2020-10-08 DIAGNOSIS — Z79899 Other long term (current) drug therapy: Secondary | ICD-10-CM | POA: Diagnosis not present

## 2020-10-08 DIAGNOSIS — J441 Chronic obstructive pulmonary disease with (acute) exacerbation: Secondary | ICD-10-CM | POA: Diagnosis not present

## 2020-10-08 DIAGNOSIS — E559 Vitamin D deficiency, unspecified: Secondary | ICD-10-CM | POA: Diagnosis present

## 2020-10-08 DIAGNOSIS — Z801 Family history of malignant neoplasm of trachea, bronchus and lung: Secondary | ICD-10-CM | POA: Diagnosis not present

## 2020-10-08 DIAGNOSIS — R0602 Shortness of breath: Secondary | ICD-10-CM

## 2020-10-08 DIAGNOSIS — I428 Other cardiomyopathies: Secondary | ICD-10-CM | POA: Diagnosis not present

## 2020-10-08 DIAGNOSIS — I480 Paroxysmal atrial fibrillation: Secondary | ICD-10-CM | POA: Diagnosis not present

## 2020-10-08 DIAGNOSIS — K766 Portal hypertension: Secondary | ICD-10-CM | POA: Diagnosis present

## 2020-10-08 DIAGNOSIS — R0600 Dyspnea, unspecified: Secondary | ICD-10-CM

## 2020-10-08 DIAGNOSIS — I4891 Unspecified atrial fibrillation: Secondary | ICD-10-CM | POA: Diagnosis not present

## 2020-10-08 DIAGNOSIS — K7030 Alcoholic cirrhosis of liver without ascites: Secondary | ICD-10-CM | POA: Diagnosis present

## 2020-10-08 DIAGNOSIS — F1010 Alcohol abuse, uncomplicated: Secondary | ICD-10-CM | POA: Diagnosis present

## 2020-10-08 DIAGNOSIS — R7401 Elevation of levels of liver transaminase levels: Secondary | ICD-10-CM

## 2020-10-08 DIAGNOSIS — I272 Pulmonary hypertension, unspecified: Secondary | ICD-10-CM | POA: Diagnosis present

## 2020-10-08 DIAGNOSIS — M549 Dorsalgia, unspecified: Secondary | ICD-10-CM

## 2020-10-08 DIAGNOSIS — J90 Pleural effusion, not elsewhere classified: Secondary | ICD-10-CM | POA: Diagnosis present

## 2020-10-08 LAB — CBC WITH DIFFERENTIAL/PLATELET
Abs Immature Granulocytes: 0.03 10*3/uL (ref 0.00–0.07)
Basophils Absolute: 0 10*3/uL (ref 0.0–0.1)
Basophils Relative: 0 %
Eosinophils Absolute: 0.1 10*3/uL (ref 0.0–0.5)
Eosinophils Relative: 1 %
Hemoglobin: 14 g/dL (ref 12.0–15.0)
Immature Granulocytes: 0 %
Lymphocytes Relative: 10 %
Lymphs Abs: 0.9 10*3/uL (ref 0.7–4.0)
Monocytes Absolute: 0.6 10*3/uL (ref 0.1–1.0)
Monocytes Relative: 7 %
Neutro Abs: 7.1 10*3/uL (ref 1.7–7.7)
Neutrophils Relative %: 82 %
Platelets: 213 10*3/uL (ref 150–400)
WBC: 8.7 10*3/uL (ref 4.0–10.5)
nRBC: 0 % (ref 0.0–0.2)

## 2020-10-08 LAB — COMPREHENSIVE METABOLIC PANEL
ALT: 15 U/L (ref 0–44)
AST: 26 U/L (ref 15–41)
Albumin: 3.3 g/dL — ABNORMAL LOW (ref 3.5–5.0)
Alkaline Phosphatase: 130 U/L — ABNORMAL HIGH (ref 38–126)
Anion gap: 13 (ref 5–15)
BUN: <5 mg/dL — ABNORMAL LOW (ref 8–23)
CO2: 30 mmol/L (ref 22–32)
Calcium: 8.7 mg/dL — ABNORMAL LOW (ref 8.9–10.3)
Chloride: 81 mmol/L — ABNORMAL LOW (ref 98–111)
Creatinine, Ser: 0.47 mg/dL (ref 0.44–1.00)
GFR, Estimated: >60 mL/min (ref >60)
Glucose, Bld: 109 mg/dL — ABNORMAL HIGH (ref 70–99)
Potassium: 2.5 mmol/L — CL (ref 3.5–5.1)
Sodium: 124 mmol/L — ABNORMAL LOW (ref 135–145)
Total Bilirubin: 1 mg/dL (ref 0.3–1.2)
Total Protein: 6 g/dL — ABNORMAL LOW (ref 6.5–8.1)

## 2020-10-08 LAB — RESP PANEL BY RT-PCR (FLU A&B, COVID) ARPGX2
Influenza A by PCR: NEGATIVE
Influenza B by PCR: NEGATIVE
SARS Coronavirus 2 by RT PCR: NEGATIVE

## 2020-10-08 LAB — LACTIC ACID, PLASMA
Lactic Acid, Venous: 1.8 mmol/L (ref 0.5–1.9)
Lactic Acid, Venous: 1.2 mmol/L (ref 0.5–1.9)

## 2020-10-08 LAB — LIPASE, BLOOD: Lipase: 29 U/L (ref 11–51)

## 2020-10-08 LAB — MAGNESIUM: Magnesium: 1.1 mg/dL — ABNORMAL LOW (ref 1.7–2.4)

## 2020-10-08 IMAGING — CT CT ABD-PELV W/ CM
2 of 5 series · 15 of 46 positions shown, 17 images · IV contrast (omnipaque)
Comparison: CT abdomen pelvis [DATE]

CLINICAL DATA: Nonlocalized acute abdominal pain. Left-sided
abdominal pain for 2 months.

EXAM:
CT ABDOMEN AND PELVIS WITH CONTRAST
TECHNIQUE: Multidetector CT imaging of the abdomen and pelvis was performed
using the standard protocol following bolus administration of
intravenous contrast.
CONTRAST:  100mL OMNIPAQUE IOHEXOL 300 MG/ML  SOLN

[Series 3: abdomen 5.0 · axial · 0.92mm/px · z∈[+692,+1097]mm · 12 of 93 slices shown, 14 images]
[im 6/93  soft-tissue]
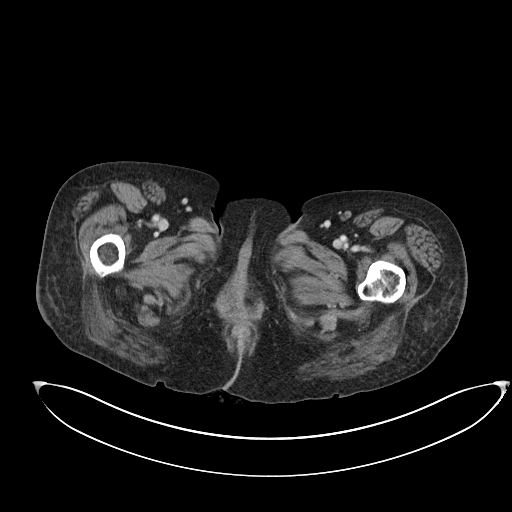
[im 6/93  bone]
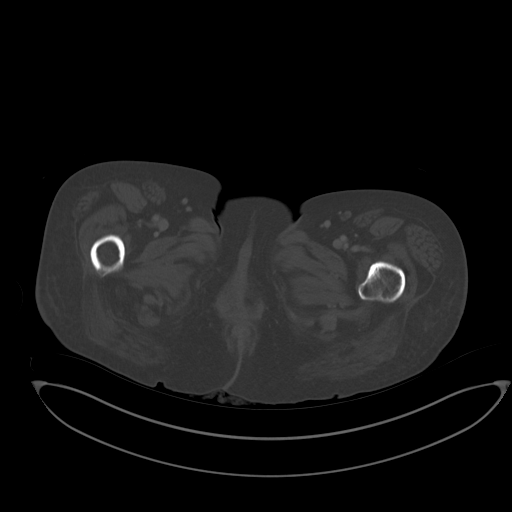
[im 12/93  soft-tissue]
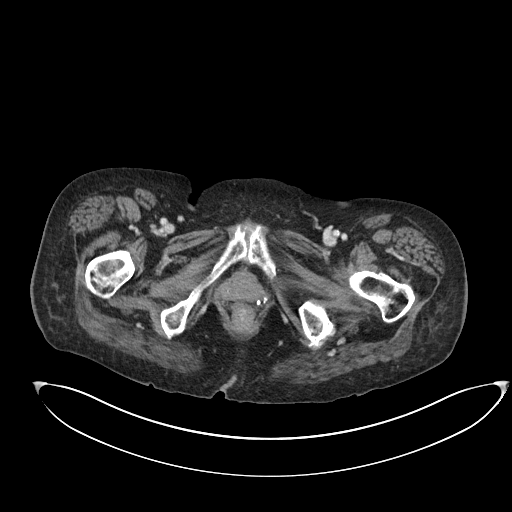
[im 24/93  soft-tissue]
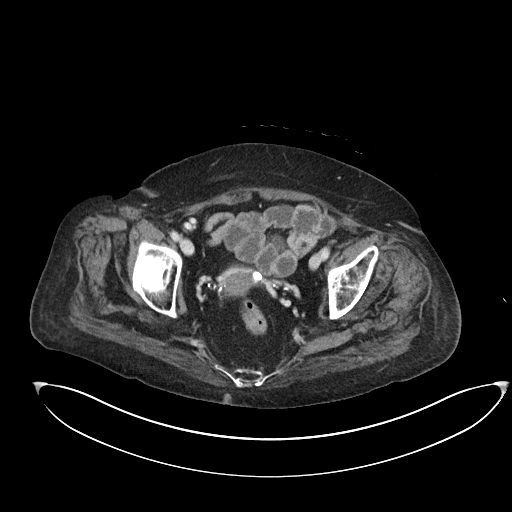
[im 29/93  soft-tissue]
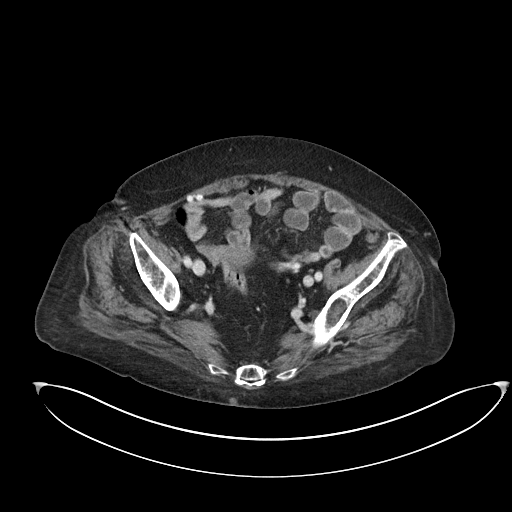
[im 35/93  soft-tissue]
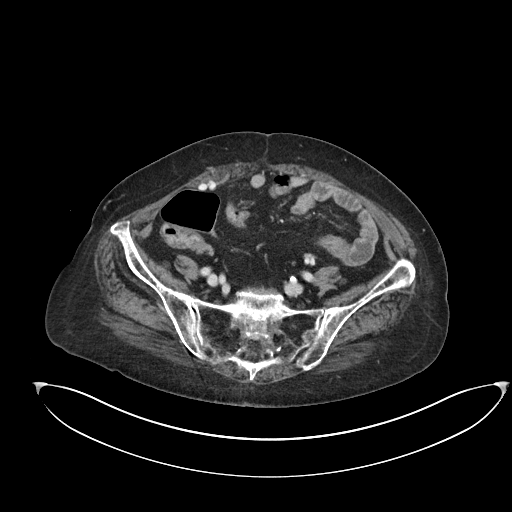
[im 41/93  soft-tissue]
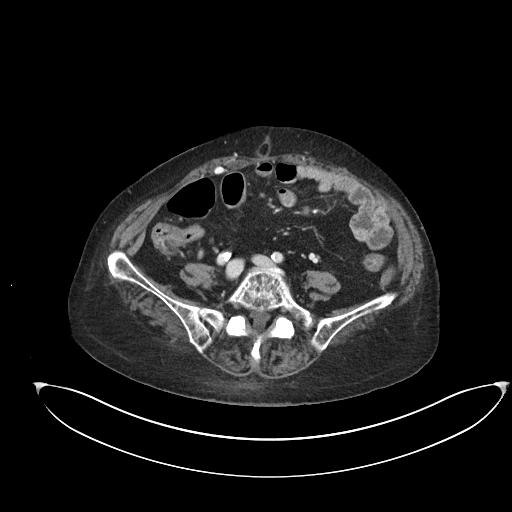
[im 52/93  soft-tissue]
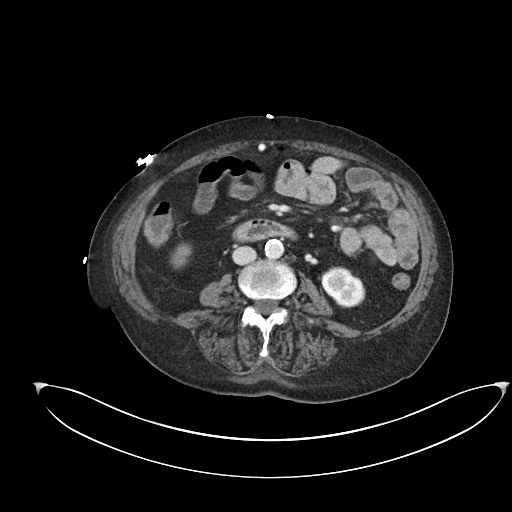
[im 58/93  soft-tissue]
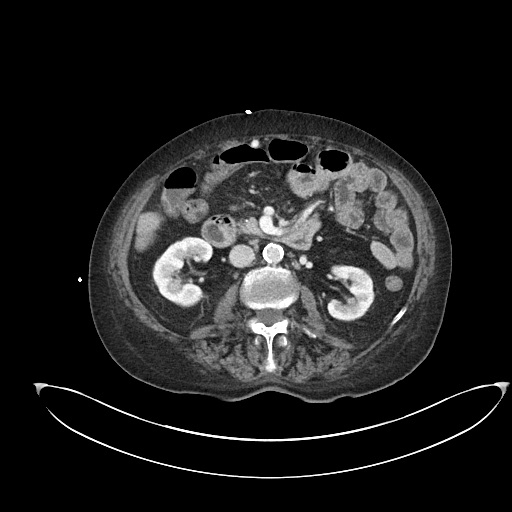
[im 64/93  soft-tissue]
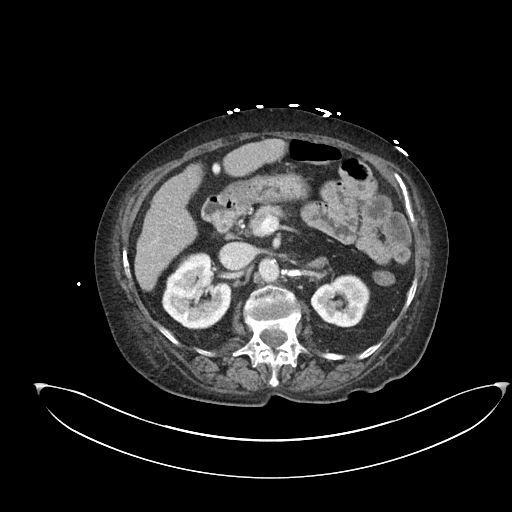
[im 64/93  bone]
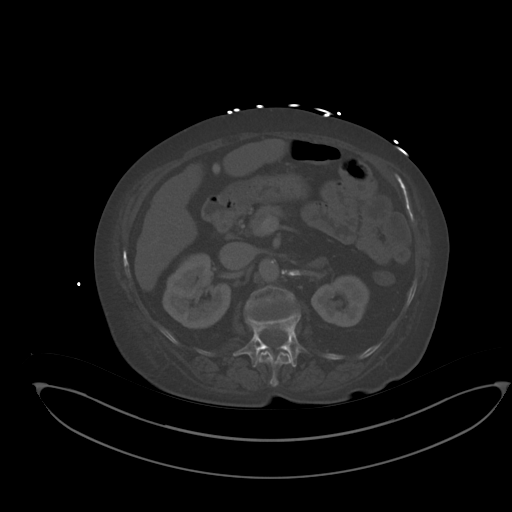
[im 70/93  soft-tissue]
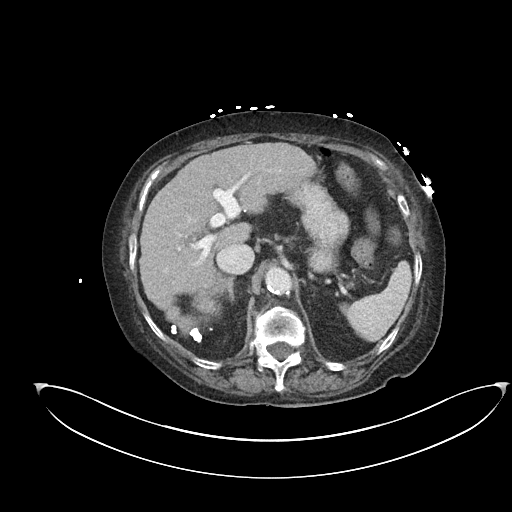
[im 81/93  soft-tissue]
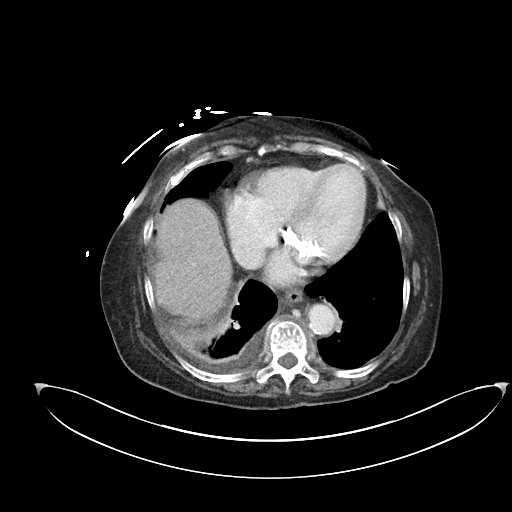
[im 87/93  soft-tissue]
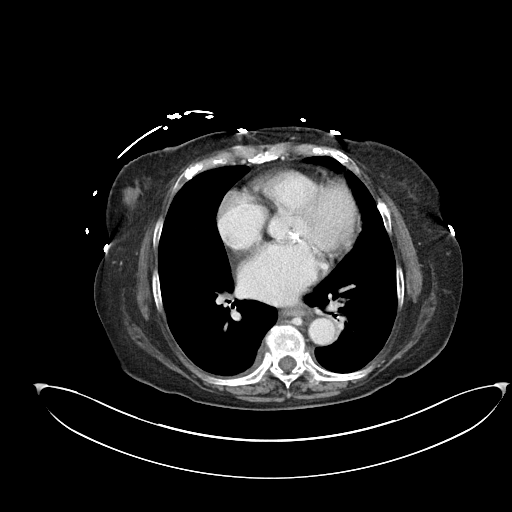

[Series 6: abdomen 3.0 mpr cor · coronal · 0.81mm/px · 3 of 92 slices shown]
[im 31/92  soft-tissue]
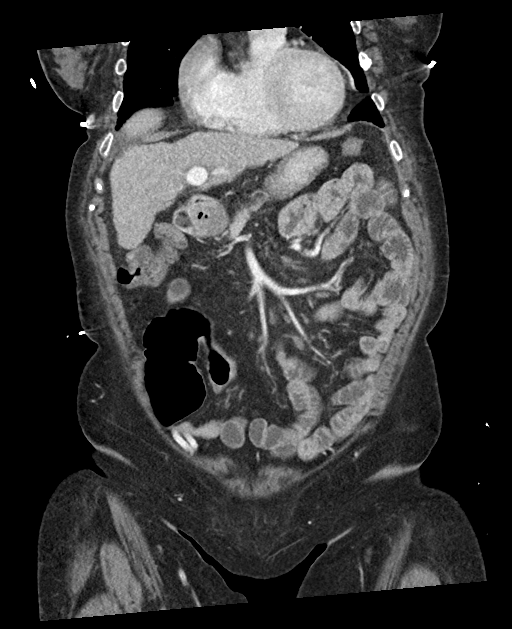
[im 41/92  soft-tissue]
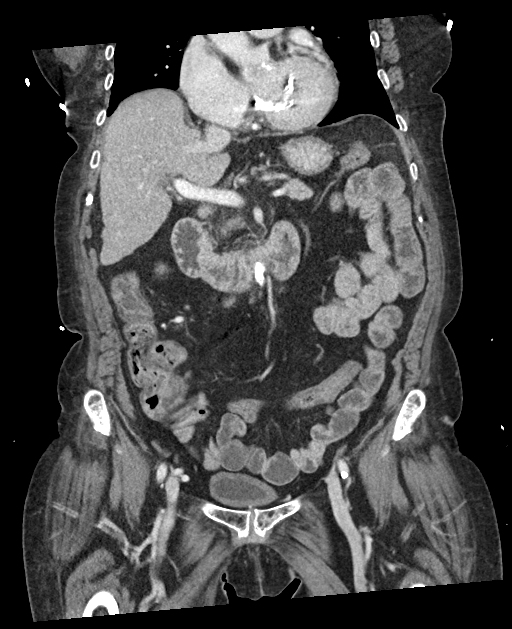
[im 51/92  soft-tissue]
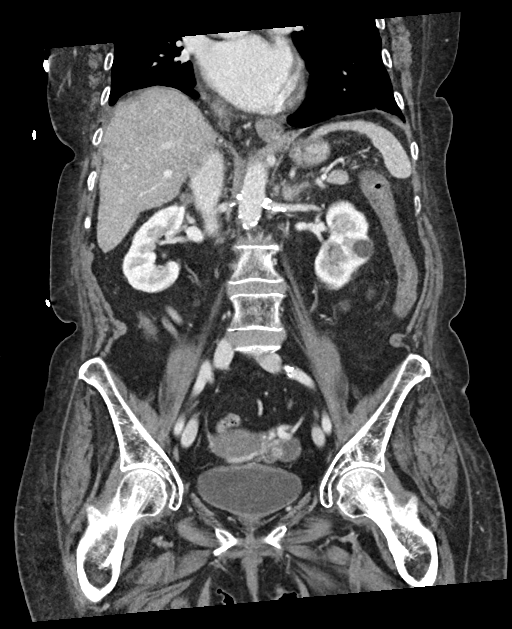

[15 of 46 positions shown; findings below may reference images not displayed]

FINDINGS: Lower chest: Trace right pleural effusion with associated right
lower lobe passive atelectasis. Mitral annular calcifications.
Possible tiny hiatal hernia.

Hepatobiliary: Nodular hepatic contour. The hepatic parenchyma is
diffusely hypodense compared to the splenic parenchyma consistent
with fatty infiltration. No focal liver abnormality. Status post
cholecystectomy. No biliary dilatation.

Pancreas: No focal lesion. Normal pancreatic contour. No surrounding
inflammatory changes. No main pancreatic ductal dilatation.

Spleen: Normal in size without focal abnormality.

Adrenals/Urinary Tract:

No adrenal nodule bilaterally.

Bilateral kidneys enhance symmetrically. 1.7 cm fluid density lesion
within the left kidney likely represents a simple renal cyst. No
hydronephrosis. No hydroureter.

The urinary bladder is unremarkable.

On delayed imaging, there is no urothelial wall thickening and there
are no filling defects in the opacified portions of the bilateral
collecting systems or ureters.

Stomach/Bowel: Stomach is within normal limits. No evidence of bowel
wall thickening or dilatation. Appendix appears normal.

Vascular/Lymphatic: Recanalized paraumbilical vein. Venous
collateral are noted. Slight left ovarian venous congestion. The
main portal, splenic, superior mesenteric veins are patent. No
abdominal aorta or iliac aneurysm. Moderate to severe
atherosclerotic plaque of the aorta and its branches. No abdominal,
pelvic, or inguinal lymphadenopathy.

Reproductive: Uterus and bilateral adnexa are unremarkable.

Other: No intraperitoneal free fluid. No intraperitoneal free gas.
No organized fluid collection.

Musculoskeletal:

No abdominal wall hernia or abnormality.

Diffusely decreased bone density. No suspicious lytic or blastic
osseous lesions. No acute displaced fracture. Multilevel
degenerative changes of the spine with interval development of
compression fractures of the T10, T12, L1 with greater than 90%
height loss.
IMPRESSION: 1. Cirrhosis with portal hypertension. No definite focal hepatic
lesion; however limited evaluation on this single phase contrast
study. Recommend MRI liver protocol for further evaluation.
2. Trace volume right pleural effusion.
3. Possible tiny hiatal hernia.
4. Interval development of T10, T12, L1 compression fractures with
greater than 90% height loss. Correlate with point tenderness to
palpation to evaluate for acute component.
5. Aortic Atherosclerosis ([2L]-[2L]) including mitral annular
calcifications.

## 2020-10-08 MED ORDER — ENOXAPARIN SODIUM 40 MG/0.4ML ~~LOC~~ SOLN
40.0000 mg | SUBCUTANEOUS | Status: DC
  Filled 2020-10-08: qty 0.4

## 2020-10-08 MED ORDER — FENTANYL CITRATE (PF) 100 MCG/2ML IJ SOLN
50.0000 ug | Freq: Once | INTRAMUSCULAR | Status: AC
  Administered 2020-10-08: 50 ug via INTRAVENOUS
  Filled 2020-10-08: qty 2

## 2020-10-08 MED ORDER — POTASSIUM CHLORIDE 10 MEQ/100ML IV SOLN
10.0000 meq | INTRAVENOUS | Status: AC
  Administered 2020-10-08 (×4): 10 meq via INTRAVENOUS
  Filled 2020-10-08 (×5): qty 100

## 2020-10-08 MED ORDER — SODIUM CHLORIDE 0.9 % IV SOLN: Freq: Once | INTRAVENOUS | Status: AC

## 2020-10-08 MED ORDER — SODIUM CHLORIDE 0.9 % IV BOLUS
500.0000 mL | Freq: Once | INTRAVENOUS | Status: AC
  Administered 2020-10-08: 500 mL via INTRAVENOUS

## 2020-10-08 MED ORDER — MORPHINE SULFATE (PF) 2 MG/ML IV SOLN
2.0000 mg | INTRAVENOUS | Status: DC | PRN
Start: 2020-10-08 — End: 2020-10-16
  Administered 2020-10-09 – 2020-10-11 (×5): 2 mg via INTRAVENOUS
  Filled 2020-10-08 (×5): qty 1

## 2020-10-08 MED ORDER — OXYCODONE HCL 5 MG PO TABS
5.0000 mg | ORAL_TABLET | ORAL | Status: DC | PRN
  Administered 2020-10-08: 5 mg via ORAL
  Filled 2020-10-08: qty 1

## 2020-10-08 MED ORDER — ONDANSETRON HCL 4 MG/2ML IJ SOLN
4.0000 mg | Freq: Once | INTRAMUSCULAR | Status: AC
  Administered 2020-10-08: 4 mg via INTRAVENOUS
  Filled 2020-10-08: qty 2

## 2020-10-08 MED ORDER — ENSURE ENLIVE PO LIQD
237.0000 mL | Freq: Two times a day (BID) | ORAL | Status: DC
  Administered 2020-10-09 – 2020-10-18 (×12): 237 mL via ORAL

## 2020-10-08 MED ORDER — IOHEXOL 300 MG/ML  SOLN
100.0000 mL | Freq: Once | INTRAMUSCULAR | Status: AC | PRN
  Administered 2020-10-08: 100 mL via INTRAVENOUS

## 2020-10-08 MED ORDER — PANTOPRAZOLE SODIUM 40 MG IV SOLR
40.0000 mg | Freq: Every day | INTRAVENOUS | Status: DC
  Administered 2020-10-08 – 2020-10-09 (×2): 40 mg via INTRAVENOUS
  Filled 2020-10-08 (×2): qty 40

## 2020-10-08 MED ORDER — MAGNESIUM SULFATE 4 GM/100ML IV SOLN
4.0000 g | Freq: Once | INTRAVENOUS | Status: AC
  Administered 2020-10-08: 4 g via INTRAVENOUS
  Filled 2020-10-08: qty 100

## 2020-10-08 NOTE — ED Triage Notes (Signed)
Emergency Medicine Provider Triage Evaluation Note  [name] , a 70 y.o. female  was evaluated in triage.  Pt complains of lower back pain for 2 months,. No trauma. Doesn't radiate. New urinary and bowel incontinence for the last two weeks. No fevers  Review of Systems  Positive: Back pain, bowel and bladder incotince  Negative: fever  Physical Exam  There were no vitals taken for this visit. Gen:   Awake, no distress   HEENT:  Atraumatic Resp:  Normal effort Cardiac:  Normal rate Abd:   Nondistended, nontender  MSK:   Pain in thoracic area, spans midline Neuro:  Speech clear   Medical Decision Making  Medically screening exam initiated at 2:44 PM.  Appropriate orders placed.  ANETTE BARRA was informed that the remainder of the evaluation will be completed by another provider, this initial triage assessment does not replace that evaluation, and the importance of remaining in the ED until their evaluation is complete.  Clinical Impression  MSE was initiated and I personally evaluated the patient and placed orders (if any) at  2:46 PM on October 08, 2020.  The patient appears stable so that the remainder of the MSE may be completed by another provider.    Alfredia Client, PA-C 10/08/20 1448

## 2020-10-08 NOTE — ED Provider Notes (Signed)
[facility] DEPARTMENT Provider Note   CSN: [id number] Arrival date & time: 10/08/20  1419     History Chief Complaint  Patient presents with  . Back Pain  . Abdominal Pain    [name] is a 70 y.o. female.  The history is provided by the patient and medical records.  Back Pain Associated symptoms: abdominal pain   Abdominal Pain  [name] is a 70 y.o. female who presents to the Emergency Department complaining of abdominal pain. She presents the emergency department for two months of progressive intermittent sharp abdominal pain. Pain is located throughout her entire abdomen and at times radiates to her back. Over the last two days she is developed diarrhea. She is having to nonbloody bowel movements daily. She denies any fevers, vomiting, dysuria. She does report poor appetite but no nausea. No prior similar symptoms.    Past Medical History:  Diagnosis Date  . ACROCHORDON 03/16/2010  . GI bleeding 06/2018  . Heart murmur   . Hypertension   . Hypothyroidism   . Liver cirrhosis ([place])   . Multiple duodenal ulcers 07/16/2018   associated with melena, anemia, gastritis. due to NSAID.  hx gastric ulcers and bleeding 2004, 2007  . Thyroid disease    S/p Radiation    Patient Active Problem List   Diagnosis Date Noted  . Irregular heart beat 05/04/2020  . Duodenal ulcer   . GI bleed 07/15/2018  . Leukocytosis 07/15/2018  . Hyponatremia 07/15/2018  . Essential hypertension 07/15/2018  . Hypothyroidism 07/15/2018  . Melena   . Acute gallstone pancreatitis   . Gallstone pancreatitis 12/05/2015  . Acute pancreatitis 12/05/2015  . Choledocholithiasis   . Fracture of proximal end of right humerus 07/22/2013  . Tibia/fibula fracture 07/18/2013  . Mood disorder ([place]) 05/19/2013  . Undiagnosed cardiac murmurs 04/13/2013  . Preventive measure 04/13/2013  . Bradycardia 11/21/2011  . Sleep difficulties 11/21/2011  . HYPERTENSION,  BENIGN ESSENTIAL 03/22/2009  . TOBACCO ABUSE 10/01/2007  . HYPOTHYROIDISM, POST-RADIOACTIVE IODINE 06/13/2007  . CIRRHOSIS, ALCOHOLIC, LIVER 93/57/0177    Past Surgical History:  Procedure Laterality Date  . BIOPSY  07/16/2018   Procedure: BIOPSY;  Surgeon: Mauri Pole, MD;  Location: [facility];  Service: Endoscopy;;  . BREAST EXCISIONAL BIOPSY Left    benign  . CHOLECYSTECTOMY N/A 12/07/2015   Procedure: LAPAROSCOPIC CHOLECYSTECTOMY WITH INTRAOPERATIVE CHOLANGIOGRAM;  Surgeon: Alphonsa Overall, MD;  Location: [facility];  Service: General;  Laterality: N/A;  . ERCP N/A 12/06/2015   Procedure: ENDOSCOPIC RETROGRADE CHOLANGIOPANCREATOGRAPHY (ERCP);  Surgeon: Milus Banister, MD;  Location: [facility];  Service: Endoscopy;  Laterality: N/A;  . ESOPHAGOGASTRODUODENOSCOPY (EGD) WITH PROPOFOL N/A 07/16/2018   Procedure: ESOPHAGOGASTRODUODENOSCOPY (EGD) WITH PROPOFOL;  Surgeon: Mauri Pole, MD;  Location: [facility];  Service: Endoscopy;  Laterality: N/A;  . IM NAILING TIBIA Right 07/19/2012   DR Maureen Ralphs  . SHOULDER HEMI-ARTHROPLASTY Right 07/22/2013   Procedure: RIGHT SHOULDER HEMI-ARTHROPLASTY;  Surgeon: Augustin Schooling, MD;  Location: [facility];  Service: Orthopedics;  Laterality: Right;  . TIBIA IM NAIL INSERTION Right 07/19/2013   Procedure: INTRAMEDULLARY (IM) NAIL TIBIAL;  Surgeon: Gearlean Alf, MD;  Location: [facility];  Service: Orthopedics;  Laterality: Right;  . TUBAL LIGATION       OB History   No obstetric history on file.     Family History  Problem Relation Age of Onset  . Heart disease Mother   . Lung cancer Father   .  Heart disease Brother   . Psoriasis Brother   . Heart disease Brother   . Healthy Daughter   . Healthy Daughter     Social History   Tobacco Use  . Smoking status: Current Every Day Smoker    Packs/day: 1.00    Years: 40.00    Pack years: 40.00    Types: Cigarettes  . Smokeless tobacco: Never Used  Vaping Use  . Vaping Use: Never used   Substance Use Topics  . Alcohol use: No    Comment: stopped drinking 5 weeks ago/updated 07/09/2018  . Drug use: No    Home Medications Prior to Admission medications   Medication Sig Start Date End Date Taking? Authorizing Provider  amLODipine (NORVASC) 10 MG tablet Take 1 tablet (10 mg total) by mouth daily. 12/09/15   Bonnell Public, MD  Ascorbic Acid (VITAMIN C) 100 MG tablet Take 100 mg by mouth daily.    [provider]  Bilberry, Vaccinium myrtillus, (BILBERRY FRUIT PO) Take 1 tablet by mouth daily.    [provider]  Camphor-Menthol-Methyl Sal (TIGER BALM MUSCLE RUB) 08-27-13 % CREA Apply 1 application topically as needed (leg pain).    [provider]  citalopram (CELEXA) 20 MG tablet TAKE 1 TABLET BY MOUTH EVERY DAY. Patient taking differently: Take 20 mg by mouth daily.  03/17/14   Patrecia Pour, MD  ferrous sulfate 325 (65 FE) MG EC tablet Take 1 tablet (325 mg total) by mouth 3 (three) times daily with meals for 30 days. 07/18/18 08/17/18  Amin, Ankit Chirag, MD  Flaxseed, Linseed, (FLAX SEED OIL PO) Take 1 tablet by mouth daily.    [provider]  levothyroxine (SYNTHROID, LEVOTHROID) 137 MCG tablet Take 137 mcg by mouth daily. 05/23/18   [provider]  Liniments (BEN GAY EX) Apply 1 application topically as needed (leg pain).    [provider]  Multiple Vitamins-Minerals (MULTIVITAMIN & MINERAL PO) Take 1 tablet by mouth daily.    [provider]  omega-3 acid ethyl esters (LOVAZA) 1 g capsule Take 1 g by mouth daily.    [provider]  Omega-3 Fatty Acids (FISH OIL CONCENTRATE) 1000 MG CAPS Take 1,000 mg by mouth daily.    [provider]  pantoprazole (PROTONIX) 40 MG tablet Take 1 tablet (40 mg total) by mouth 2 (two) times daily before a meal for 30 days. 07/18/18 08/17/18  Amin, Jeanella Flattery, MD  polyethylene glycol (MIRALAX) packet Take 17 g by mouth daily as needed for moderate  constipation or severe constipation. 07/18/18   Amin, Jeanella Flattery, MD  traMADol (ULTRAM) 50 MG tablet Take 1 tablet (50 mg total) by mouth every 12 (twelve) hours as needed for up to 30 doses for severe pain. 07/18/18   Amin, Jeanella Flattery, MD  vitamin B-12 (CYANOCOBALAMIN) 1000 MCG tablet Take 1,000 mcg by mouth daily.    [provider]    Allergies    Patient has no active allergies.  Review of Systems   Review of Systems  Gastrointestinal: Positive for abdominal pain.  Musculoskeletal: Positive for back pain.  All other systems reviewed and are negative.   Physical Exam Updated Vital Signs BP (!) 146/102   Pulse (!) 109   Temp 98.2 F (36.8 C) (Oral)   Resp (!) 23   SpO2 93%   Physical Exam Vitals and nursing note reviewed.  Constitutional:      Appearance: She is well-developed.  HENT:  Head: Normocephalic and atraumatic.  Cardiovascular:     Rate and Rhythm: Tachycardia present. Rhythm irregular.     Heart sounds: No murmur heard.   Pulmonary:     Effort: Pulmonary effort is normal. No respiratory distress.     Breath sounds: Normal breath sounds.  Abdominal:     Palpations: Abdomen is soft.     Tenderness: There is no guarding or rebound.     Comments: Mild generalized abdominal tenderness  Musculoskeletal:        General: No tenderness.  Skin:    General: Skin is warm and dry.  Neurological:     Mental Status: She is alert and oriented to person, place, and time.  Psychiatric:        Behavior: Behavior normal.     ED Results / Procedures / Treatments   Labs (all labs ordered are listed, but only abnormal results are displayed) Labs Reviewed  COMPREHENSIVE METABOLIC PANEL - Abnormal; Notable for the following components:      Result Value   Sodium 124 (*)    Potassium 2.5 (*)    Chloride 81 (*)    Glucose, Bld 109 (*)    BUN <5 (*)    Calcium 8.7 (*)    Total Protein 6.0 (*)    Albumin 3.3 (*)    Alkaline Phosphatase 130 (*)    All  other components within normal limits  MAGNESIUM - Abnormal; Notable for the following components:   Magnesium 1.1 (*)    All other components within normal limits  RESP PANEL BY RT-PCR (FLU A&B, COVID) ARPGX2  LIPASE, BLOOD  LACTIC ACID, PLASMA  CBC WITH DIFFERENTIAL/PLATELET  URINALYSIS, ROUTINE W REFLEX MICROSCOPIC  LACTIC ACID, PLASMA    EKG EKG Interpretation  Date/Time:  Saturday October 08 2020 16:21:39 EDT Ventricular Rate:  116 PR Interval:    QRS Duration: 90 QT Interval:  342 QTC Calculation: 476 R Axis:   47 Text Interpretation: Atrial fibrillation Ventricular premature complex Probable anteroseptal infarct, old Borderline ST depression, diffuse leads Confirmed by Quintella Reichert [phone]) on 10/08/2020 4:31:50 PM   Radiology CT Abdomen Pelvis W Contrast  Result Date: 10/08/2020 CLINICAL DATA:  Nonlocalized acute abdominal pain. Left-sided abdominal pain for 2 months. EXAM: CT ABDOMEN AND PELVIS WITH CONTRAST TECHNIQUE: Multidetector CT imaging of the abdomen and pelvis was performed using the standard protocol following bolus administration of intravenous contrast. CONTRAST:  164mL OMNIPAQUE IOHEXOL 300 MG/ML  SOLN COMPARISON:  CT abdomen pelvis 12/05/2015 FINDINGS: Lower chest: Trace right pleural effusion with associated right lower lobe passive atelectasis. Mitral annular calcifications. Possible tiny hiatal hernia. Hepatobiliary: Nodular hepatic contour. The hepatic parenchyma is diffusely hypodense compared to the splenic parenchyma consistent with fatty infiltration. No focal liver abnormality. Status post cholecystectomy. No biliary dilatation. Pancreas: No focal lesion. Normal pancreatic contour. No surrounding inflammatory changes. No main pancreatic ductal dilatation. Spleen: Normal in size without focal abnormality. Adrenals/Urinary Tract: No adrenal nodule bilaterally. Bilateral kidneys enhance symmetrically. 1.7 cm fluid density lesion within the left kidney likely  represents a simple renal cyst. No hydronephrosis. No hydroureter. The urinary bladder is unremarkable. On delayed imaging, there is no urothelial wall thickening and there are no filling defects in the opacified portions of the bilateral collecting systems or ureters. Stomach/Bowel: Stomach is within normal limits. No evidence of bowel wall thickening or dilatation. Appendix appears normal. Vascular/Lymphatic: Recanalized paraumbilical vein. Venous collateral are noted. Slight left ovarian venous congestion. The main portal, splenic, superior mesenteric veins are  patent. No abdominal aorta or iliac aneurysm. Moderate to severe atherosclerotic plaque of the aorta and its branches. No abdominal, pelvic, or inguinal lymphadenopathy. Reproductive: Uterus and bilateral adnexa are unremarkable. Other: No intraperitoneal free fluid. No intraperitoneal free gas. No organized fluid collection. Musculoskeletal: No abdominal wall hernia or abnormality. Diffusely decreased bone density. No suspicious lytic or blastic osseous lesions. No acute displaced fracture. Multilevel degenerative changes of the spine with interval development of compression fractures of the T10, T12, L1 with greater than 90% height loss. IMPRESSION: 1. Cirrhosis with portal hypertension. No definite focal hepatic lesion; however limited evaluation on this single phase contrast study. Recommend MRI liver protocol for further evaluation. 2. Trace volume right pleural effusion. 3. Possible tiny hiatal hernia. 4. Interval development of T10, T12, L1 compression fractures with greater than 90% height loss. Correlate with point tenderness to palpation to evaluate for acute component. 5. Aortic Atherosclerosis (ICD10-I70.0) including mitral annular calcifications. Electronically Signed   By: Iven Finn M.D.   On: 10/08/2020 19:58    Procedures Procedures  CRITICAL CARE Performed by: Quintella Reichert   Total critical care time: 45 minutes  Critical  care time was exclusive of separately billable procedures and treating other patients.  Critical care was necessary to treat or prevent imminent or life-threatening deterioration.  Critical care was time spent personally by me on the following activities: development of treatment plan with patient and/or surrogate as well as nursing, discussions with consultants, evaluation of patient's response to treatment, examination of patient, obtaining history from patient or surrogate, ordering and performing treatments and interventions, ordering and review of laboratory studies, ordering and review of radiographic studies, pulse oximetry and re-evaluation of patient's condition.   Medications Ordered in ED Medications  potassium chloride 10 mEq in 100 mL IVPB (10 mEq Intravenous New Bag/Given 10/08/20 1752)  magnesium sulfate IVPB 4 g 100 mL (4 g Intravenous New Bag/Given 10/08/20 1928)  fentaNYL (SUBLIMAZE) injection 50 mcg (50 mcg Intravenous Given 10/08/20 1644)  ondansetron (ZOFRAN) injection 4 mg (4 mg Intravenous Given 10/08/20 1643)  sodium chloride 0.9 % bolus 500 mL (500 mLs Intravenous New Bag/Given 10/08/20 1649)  iohexol (OMNIPAQUE) 300 MG/ML solution 100 mL (100 mLs Intravenous Contrast Given 10/08/20 1915)    ED Course  I have reviewed the triage vital signs and the nursing notes.  Pertinent labs & imaging results that were available during my care of the patient were reviewed by me and considered in my medical decision making (see chart for details).    MDM Rules/Calculators/A&P                         patient here for evaluation of two months of abdominal pain/back pain now with diarrhea. She is tachycardic on ED presentation in rapid atrial fibrillation. Labs with significant hypomag as well as hypokalemia and hyponatremia. She was treated with electrolyte replacement. Her heart rate did improve with medications. CTabd pelvis obtained which demonstrates interval development of thoracic  compression fractures. Unclear if this is a source of her pain. Plan to admit for ongoing treatment for her electrolyte abnormalities. Patient updated of findings of studies and she is in agreement with treatment plan. Medicine consulted for admission. Final Clinical Impression(s) / ED Diagnoses Final diagnoses:  Hypomagnesemia  Hypokalemia  Compression fracture of body of thoracic vertebra [facility])    Rx / DC Orders ED Discharge Orders    None       Quintella Reichert, MD 10/08/20  2022  

## 2020-10-08 NOTE — ED Notes (Signed)
Attempted report.  Unit to call me back.

## 2020-10-08 NOTE — ED Notes (Signed)
Patient transported to CT 

## 2020-10-08 NOTE — ED Notes (Signed)
Critical lab result K 2.5.  Dr. Jeanell Sparrow notified.

## 2020-10-08 NOTE — H&P (Signed)
History and Physical  MADISAN Murray ERX:[id number] DOB: 1950/09/02 DOA: 10/08/2020  Referring physician: Quintella Reichert, MD PCP: [name], MD  Patient coming from: Home  Chief Complaint: Abdominal and back pain  HPI: [name] is a 70 y.o. female with medical history significant for hypertension, GERD, hypothyroidism, liver cirrhosis who presents to the emergency department due to 34-month history of abdominal and back pain.  Abdominal pain was diffuse and was described as sharp and intermittent, she complained of 2-day onset of nonbloody watery stools which was twice daily and last bowel movement was this morning.  She denies fever, chills, nausea, vomiting, chest pain or shortness of breath.  Patient endorsed decreased appetite.  ED Course:  In the emergency department, she was tachycardic and tachypneic.  BP was 155/115.  O2 sat was 97% on room air.  Work-up in the ED showed hyponatremia, hypokalemia, hypoalbuminemia, hypomagnesemia.  ALP 130.  Influenza A, B and SARS coronavirus 2 was negative. CT abdomen and pelvis with contrast showed Cirrhosis with portal hypertension. No definite focal hepatic lesion.  Trace volume right pleural effusion and interval development of T10, T12, L1 compression fractures with greater than 90% height loss. She was treated with IV fentanyl, magnesium and potassium were replenished, IV hydration was given.  Hospitalist was asked to admit patient for further evaluation and management.   Review of Systems: Constitutional: Negative for chills and fever.  HENT: Negative for ear pain and sore throat.   Eyes: Negative for pain and visual disturbance.  Respiratory: Negative for cough, chest tightness and shortness of breath.   Cardiovascular: Negative for chest pain and palpitations.  Gastrointestinal: Positive for abdominal pain and negative for vomiting.  Endocrine: Negative for polyphagia and polyuria.  Genitourinary: Negative for  decreased urine volume, dysuria, enuresis Musculoskeletal: Positive for back pain.  Skin: Negative for color change and rash.  Allergic/Immunologic: Negative for immunocompromised state.  Neurological: Negative for tremors, syncope, speech difficulty, weakness, light-headedness and headaches.  Hematological: Does not bruise/bleed easily.  All other systems reviewed and are negative    Past Medical History:  Diagnosis Date  . ACROCHORDON 03/16/2010  . GI bleeding 06/2018  . Heart murmur   . Hypertension   . Hypothyroidism   . Liver cirrhosis ([place])   . Multiple duodenal ulcers 07/16/2018   associated with melena, anemia, gastritis. due to NSAID.  hx gastric ulcers and bleeding 2004, 2007  . Thyroid disease    S/p Radiation   Past Surgical History:  Procedure Laterality Date  . BIOPSY  07/16/2018   Procedure: BIOPSY;  Surgeon: Mauri Pole, MD;  Location: [facility];  Service: Endoscopy;;  . BREAST EXCISIONAL BIOPSY Left    benign  . CHOLECYSTECTOMY N/A 12/07/2015   Procedure: LAPAROSCOPIC CHOLECYSTECTOMY WITH INTRAOPERATIVE CHOLANGIOGRAM;  Surgeon: Alphonsa Overall, MD;  Location: [facility];  Service: General;  Laterality: N/A;  . ERCP N/A 12/06/2015   Procedure: ENDOSCOPIC RETROGRADE CHOLANGIOPANCREATOGRAPHY (ERCP);  Surgeon: Milus Banister, MD;  Location: [facility];  Service: Endoscopy;  Laterality: N/A;  . ESOPHAGOGASTRODUODENOSCOPY (EGD) WITH PROPOFOL N/A 07/16/2018   Procedure: ESOPHAGOGASTRODUODENOSCOPY (EGD) WITH PROPOFOL;  Surgeon: Mauri Pole, MD;  Location: [facility];  Service: Endoscopy;  Laterality: N/A;  . IM NAILING TIBIA Right 07/19/2012   DR Maureen Ralphs  . SHOULDER HEMI-ARTHROPLASTY Right 07/22/2013   Procedure: RIGHT SHOULDER HEMI-ARTHROPLASTY;  Surgeon: Augustin Schooling, MD;  Location: [facility];  Service: Orthopedics;  Laterality: Right;  . TIBIA IM NAIL INSERTION Right 07/19/2013   Procedure:  INTRAMEDULLARY (IM) NAIL TIBIAL;  Surgeon: Gearlean Alf, MD;   Location: [facility];  Service: Orthopedics;  Laterality: Right;  . TUBAL LIGATION      Social History:  reports that she has been smoking cigarettes. She has a 40.00 pack-year smoking history. She has never used smokeless tobacco. She reports that she does not drink alcohol and does not use drugs.   No Active Allergies  Family History  Problem Relation Age of Onset  . Heart disease Mother   . Lung cancer Father   . Heart disease Brother   . Psoriasis Brother   . Heart disease Brother   . Healthy Daughter   . Healthy Daughter     Prior to Admission medications   Medication Sig Start Date End Date Taking? Authorizing Provider  citalopram (CELEXA) 20 MG tablet TAKE 1 TABLET BY MOUTH EVERY DAY. Patient taking differently: Take 20 mg by mouth daily. 03/17/14  Yes Patrecia Pour, MD  hydrALAZINE (APRESOLINE) 10 MG tablet Take 10 mg by mouth 3 (three) times daily.   Yes [provider]  levothyroxine (SYNTHROID, LEVOTHROID) 137 MCG tablet Take 137 mcg by mouth daily. 05/23/18  Yes [provider]  lisinopril (ZESTRIL) 40 MG tablet Take 40 mg by mouth daily.   Yes [provider]  amLODipine (NORVASC) 10 MG tablet Take 1 tablet (10 mg total) by mouth daily. 12/09/15   Bonnell Public, MD  Ascorbic Acid (VITAMIN C) 100 MG tablet Take 100 mg by mouth daily.    [provider]  Bilberry, Vaccinium myrtillus, (BILBERRY FRUIT PO) Take 1 tablet by mouth daily.    [provider]  Camphor-Menthol-Methyl Sal (TIGER BALM MUSCLE RUB) 08-27-13 % CREA Apply 1 application topically as needed (leg pain).    [provider]  ferrous sulfate 325 (65 FE) MG EC tablet Take 1 tablet (325 mg total) by mouth 3 (three) times daily with meals for 30 days. 07/18/18 08/17/18  Amin, Ankit Chirag, MD  Flaxseed, Linseed, (FLAX SEED OIL PO) Take 1 tablet by mouth daily.    [provider]  Liniments (BEN GAY EX) Apply 1 application topically as needed (leg pain).     [provider]  Multiple Vitamins-Minerals (MULTIVITAMIN & MINERAL PO) Take 1 tablet by mouth daily.    [provider]  omega-3 acid ethyl esters (LOVAZA) 1 g capsule Take 1 g by mouth daily.    [provider]  Omega-3 Fatty Acids (FISH OIL CONCENTRATE) 1000 MG CAPS Take 1,000 mg by mouth daily.    [provider]  pantoprazole (PROTONIX) 40 MG tablet Take 1 tablet (40 mg total) by mouth 2 (two) times daily before a meal for 30 days. 07/18/18 08/17/18  Amin, Jeanella Flattery, MD  polyethylene glycol (MIRALAX) packet Take 17 g by mouth daily as needed for moderate constipation or severe constipation. 07/18/18   Amin, Jeanella Flattery, MD  traMADol (ULTRAM) 50 MG tablet Take 1 tablet (50 mg total) by mouth every 12 (twelve) hours as needed for up to 30 doses for severe pain. 07/18/18   Amin, Jeanella Flattery, MD  vitamin B-12 (CYANOCOBALAMIN) 1000 MCG tablet Take 1,000 mcg by mouth daily.    [provider]    Physical Exam: BP (!) 139/102   Pulse 93   Temp 98.4 F (36.9 C) (Oral)   Resp (!) 22   SpO2 92%   . General: 70 y.o. year-old female well developed well nourished in no acute distress.  Alert and oriented x3. Marland Kitchen HEENT: NCAT, EOMI . Neck: Supple, trachea medial . Cardiovascular: Tachycardia, irregular rate and rhythm with no rubs or gallops.  No thyromegaly or JVD noted.  2/4 pulses in all 4 extremities. Marland Kitchen Respiratory: Tachypnea.  Clear to auscultation with no wheezes or rales.  . Abdomen: Soft, mild generalized tenderness to palpation.  Nondistended with normal bowel sounds x4 quadrants. . Muskuloskeletal: Tender to palpation of T10- L1.  No cyanosis, clubbing or edema noted bilaterally . Neuro: CN II-XII intact, sensation, reflexes intact . Skin: No ulcerative lesions noted or rashes . Psychiatry: Mood is appropriate for condition and setting          Labs on Admission:  Basic Metabolic Panel: Recent Labs  Lab 10/08/20 1454 10/08/20 1641  NA  124*  --   K 2.5*  --   CL 81*  --   CO2 30  --   GLUCOSE 109*  --   BUN <5*  --   CREATININE 0.47  --   CALCIUM 8.7*  --   MG  --  1.1*   Liver Function Tests: Recent Labs  Lab 10/08/20 1454  AST 26  ALT 15  ALKPHOS 130*  BILITOT 1.0  PROT 6.0*  ALBUMIN 3.3*   Recent Labs  Lab 10/08/20 1454  LIPASE 29   No results for input(s): AMMONIA in the last 168 hours. CBC: Recent Labs  Lab 10/08/20 1641  WBC 8.7  NEUTROABS 7.1  HGB 14.0  HCT RESULTS UNAVAILABLE DUE TO INTERFERING SUBSTANCE  MCV RESULTS UNAVAILABLE DUE TO INTERFERING SUBSTANCE  PLT 213   Cardiac Enzymes: No results for input(s): CKTOTAL, CKMB, CKMBINDEX, TROPONINI in the last 168 hours.  BNP (last 3 results) No results for input(s): BNP in the last 8760 hours.  ProBNP (last 3 results) No results for input(s): PROBNP in the last 8760 hours.  CBG: No results for input(s): GLUCAP in the last 168 hours.  Radiological Exams on Admission: CT Abdomen Pelvis W Contrast  Result Date: 10/08/2020 CLINICAL DATA:  Nonlocalized acute abdominal pain. Left-sided abdominal pain for 2 months. EXAM: CT ABDOMEN AND PELVIS WITH CONTRAST TECHNIQUE: Multidetector CT imaging of the abdomen and pelvis was performed using the standard protocol following bolus administration of intravenous contrast. CONTRAST:  127mL OMNIPAQUE IOHEXOL 300 MG/ML  SOLN COMPARISON:  CT abdomen pelvis 12/05/2015 FINDINGS: Lower chest: Trace right pleural effusion with associated right lower lobe passive atelectasis. Mitral annular calcifications. Possible tiny hiatal hernia. Hepatobiliary: Nodular hepatic contour. The hepatic parenchyma is diffusely hypodense compared to the splenic parenchyma consistent with fatty infiltration. No focal liver abnormality. Status post cholecystectomy. No biliary dilatation. Pancreas: No focal lesion. Normal pancreatic contour. No surrounding inflammatory changes. No main pancreatic ductal dilatation. Spleen: Normal in  size without focal abnormality. Adrenals/Urinary Tract: No adrenal nodule bilaterally. Bilateral kidneys enhance symmetrically. 1.7 cm fluid density lesion within the left kidney likely represents a simple renal cyst. No hydronephrosis. No hydroureter. The urinary bladder is unremarkable. On delayed imaging, there is no urothelial wall thickening and there are no filling defects in the opacified portions of the bilateral collecting systems or ureters. Stomach/Bowel: Stomach is within normal limits. No evidence of bowel wall thickening or dilatation. Appendix appears normal. Vascular/Lymphatic: Recanalized paraumbilical vein. Venous collateral are noted. Slight left ovarian venous congestion. The main portal, splenic, superior mesenteric veins are patent. No abdominal aorta or iliac aneurysm. Moderate to severe atherosclerotic plaque of the aorta and its branches. No abdominal, pelvic, or inguinal lymphadenopathy. Reproductive:  Uterus and bilateral adnexa are unremarkable. Other: No intraperitoneal free fluid. No intraperitoneal free gas. No organized fluid collection. Musculoskeletal: No abdominal wall hernia or abnormality. Diffusely decreased bone density. No suspicious lytic or blastic osseous lesions. No acute displaced fracture. Multilevel degenerative changes of the spine with interval development of compression fractures of the T10, T12, L1 with greater than 90% height loss. IMPRESSION: 1. Cirrhosis with portal hypertension. No definite focal hepatic lesion; however limited evaluation on this single phase contrast study. Recommend MRI liver protocol for further evaluation. 2. Trace volume right pleural effusion. 3. Possible tiny hiatal hernia. 4. Interval development of T10, T12, L1 compression fractures with greater than 90% height loss. Correlate with point tenderness to palpation to evaluate for acute component. 5. Aortic Atherosclerosis (ICD10-I70.0) including mitral annular calcifications. Electronically  Signed   By: Iven Finn M.D.   On: 10/08/2020 19:58    EKG: I independently viewed the EKG done and my findings are as followed: A-Fib with RVR and PVCs  Assessment/Plan Present on Admission: . Hypokalemia due to excessive gastrointestinal loss of potassium . Hypothyroidism . Hyponatremia . HYPERTENSION, BENIGN ESSENTIAL  Principal Problem:   Back pain Active Problems:   HYPERTENSION, BENIGN ESSENTIAL   Hyponatremia   Hypothyroidism   Hypokalemia due to excessive gastrointestinal loss of potassium   Abdominal pain   Hypomagnesemia   Compression fracture of body of thoracic vertebra (HCC)   History of cirrhosis of liver  Back pain possibly due to compression fracture CT abdomen and pelvis showed interval development of T10, T12, L1 compression fractures with greater than 90% height loss. Continue oxycodone as needed Continue PT/OT eval and treat IR will be consulted and we shall await further recommendation  Abdominal pain Continue Protonix Continue IV morphine every 4 hours as needed  Hyponatremia Continue IV hydration Continue to monitor sodium with serial BMPs Urine, serum osmolality and urine sodium will be checked   Hypokalemia K+ 2.5; this was replenished  Hypomagnesemia Magnesium 1.1; this was replenished  Elevated ALP ALP 130, continue to monitor liver enzymes  Hypothyroidism Continue Synthroid  Essential hypertension Continue hydralazine and lisinopril  Hyperlipidemia Continue Lovaza  Hyponatremia possibly secondary to mild protein calorie malnutrition Protein supplements will be provided  DVT prophylaxis: SCDs, no chemoprophylaxis at this time due to anticipated IR intervention in the morning  Code Status: Full code  Family Communication: None at bedside  Disposition Plan:  Patient is from:                        home Anticipated DC to:                   SNF or family members home Anticipated DC date:               2-3  days Anticipated DC barriers:           Inpatient management needed due to electrolyte abnormalities requiring replenishment and pending IR consult   Consults called: IR  Admission status: Inpatient    Bernadette Hoit MD [facility]  10/08/2020, 10:58 PM

## 2020-10-08 NOTE — ED Triage Notes (Addendum)
C/o lower back pain and L sided abd pain x 2 months.  No radiation.  States she is wearing a diaper because she can't make it to the bathroom fast enough x 2 weeks.

## 2020-10-09 ENCOUNTER — Inpatient Hospital Stay (HOSPITAL_COMMUNITY): Payer: Medicare Other

## 2020-10-09 ENCOUNTER — Other Ambulatory Visit (HOSPITAL_COMMUNITY): Payer: Medicare Other

## 2020-10-09 DIAGNOSIS — M545 Low back pain, unspecified: Secondary | ICD-10-CM

## 2020-10-09 DIAGNOSIS — J441 Chronic obstructive pulmonary disease with (acute) exacerbation: Secondary | ICD-10-CM

## 2020-10-09 LAB — VITAMIN D 25 HYDROXY (VIT D DEFICIENCY, FRACTURES): Vit D, 25-Hydroxy: 22.17 ng/mL — ABNORMAL LOW (ref 30–100)

## 2020-10-09 LAB — OSMOLALITY, URINE: Osmolality, Ur: 177 mOsm/kg — ABNORMAL LOW (ref 300–900)

## 2020-10-09 LAB — BASIC METABOLIC PANEL
Anion gap: 9 (ref 5–15)
BUN: <5 mg/dL — ABNORMAL LOW (ref 8–23)
CO2: 33 mmol/L — ABNORMAL HIGH (ref 22–32)
Calcium: 8 mg/dL — ABNORMAL LOW (ref 8.9–10.3)
Chloride: 86 mmol/L — ABNORMAL LOW (ref 98–111)
Creatinine, Ser: 0.53 mg/dL (ref 0.44–1.00)
GFR, Estimated: >60 mL/min (ref >60)
Glucose, Bld: 89 mg/dL (ref 70–99)
Potassium: 3.2 mmol/L — ABNORMAL LOW (ref 3.5–5.1)
Sodium: 128 mmol/L — ABNORMAL LOW (ref 135–145)

## 2020-10-09 LAB — CBC
Hemoglobin: 13 g/dL (ref 12.0–15.0)
Platelets: 209 10*3/uL (ref 150–400)
WBC: 8.9 10*3/uL (ref 4.0–10.5)

## 2020-10-09 LAB — COMPREHENSIVE METABOLIC PANEL
ALT: 15 U/L (ref 0–44)
AST: 25 U/L (ref 15–41)
Albumin: 3.2 g/dL — ABNORMAL LOW (ref 3.5–5.0)
Alkaline Phosphatase: 127 U/L — ABNORMAL HIGH (ref 38–126)
Anion gap: 10 (ref 5–15)
BUN: <5 mg/dL — ABNORMAL LOW (ref 8–23)
CO2: 29 mmol/L (ref 22–32)
Calcium: 8.2 mg/dL — ABNORMAL LOW (ref 8.9–10.3)
Chloride: 85 mmol/L — ABNORMAL LOW (ref 98–111)
Creatinine, Ser: 0.51 mg/dL (ref 0.44–1.00)
GFR, Estimated: >60 mL/min (ref >60)
Glucose, Bld: 106 mg/dL — ABNORMAL HIGH (ref 70–99)
Potassium: 3.6 mmol/L (ref 3.5–5.1)
Sodium: 124 mmol/L — ABNORMAL LOW (ref 135–145)
Total Bilirubin: 1.2 mg/dL (ref 0.3–1.2)
Total Protein: 5.9 g/dL — ABNORMAL LOW (ref 6.5–8.1)

## 2020-10-09 LAB — URINALYSIS, ROUTINE W REFLEX MICROSCOPIC
Bilirubin Urine: NEGATIVE
Glucose, UA: NEGATIVE mg/dL
Hgb urine dipstick: NEGATIVE
Ketones, ur: NEGATIVE mg/dL
Leukocytes,Ua: NEGATIVE
Nitrite: NEGATIVE
Protein, ur: NEGATIVE mg/dL
Specific Gravity, Urine: 1.032 — ABNORMAL HIGH (ref 1.005–1.030)
pH: 7 (ref 5.0–8.0)

## 2020-10-09 LAB — HEPATITIS PANEL, ACUTE
HCV Ab: NONREACTIVE
Hep A IgM: NONREACTIVE
Hep B C IgM: NONREACTIVE
Hepatitis B Surface Ag: NONREACTIVE

## 2020-10-09 LAB — SODIUM, URINE, RANDOM: Sodium, Ur: <10 mmol/L

## 2020-10-09 LAB — MAGNESIUM: Magnesium: 2.6 mg/dL — ABNORMAL HIGH (ref 1.7–2.4)

## 2020-10-09 LAB — PROTIME-INR
INR: 1 (ref 0.8–1.2)
Prothrombin Time: 13 seconds (ref 11.4–15.2)

## 2020-10-09 LAB — APTT: aPTT: 34 seconds (ref 24–36)

## 2020-10-09 LAB — OSMOLALITY: Osmolality: 261 mOsm/kg — ABNORMAL LOW (ref 275–295)

## 2020-10-09 LAB — PHOSPHORUS: Phosphorus: 3.4 mg/dL (ref 2.5–4.6)

## 2020-10-09 LAB — TSH: TSH: 0.228 u[IU]/mL — ABNORMAL LOW (ref 0.350–4.500)

## 2020-10-09 LAB — GLUCOSE, CAPILLARY: Glucose-Capillary: 149 mg/dL — ABNORMAL HIGH (ref 70–99)

## 2020-10-09 IMAGING — MR MR THORACIC SPINE W/O CM
5 of 10 series · 18 of 48 positions shown · non-contrast
Comparison: Prior CT from [DATE].

CLINICAL DATA: Initial evaluation for acute spine fracture.

EXAM:
MRI THORACIC AND LUMBAR SPINE WITHOUT CONTRAST
TECHNIQUE: Multiplanar and multiecho pulse sequences of the thoracic and lumbar
spine were obtained without intravenous contrast.

[Series 16: T1 · sagittal · 3.0mm · 0.62mm/px · 1 of 9 slices shown (1 of 3)]
[im 1/9]
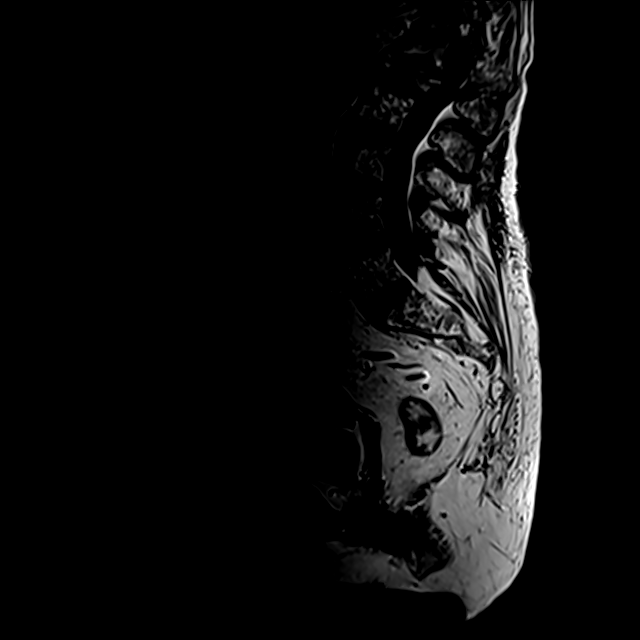

[Series 17: T1 · sagittal · 3.0mm · 0.62mm/px · 2 of 9 slices shown (2 of 3)]
[im 1/9]
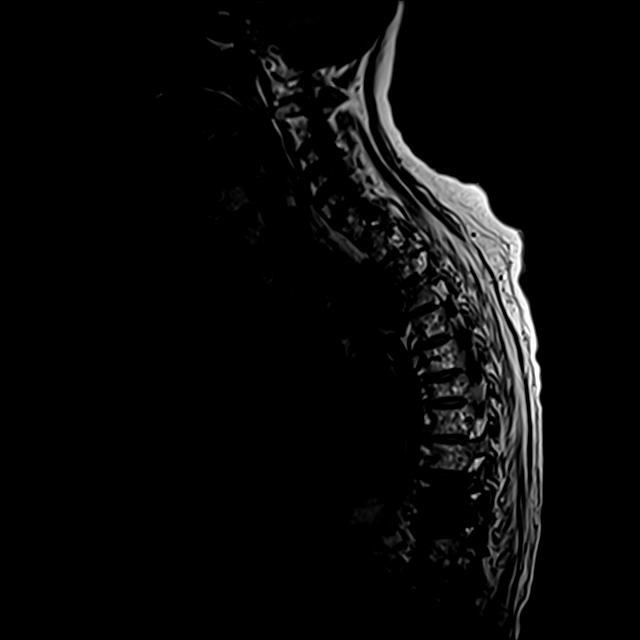
[im 9/9]
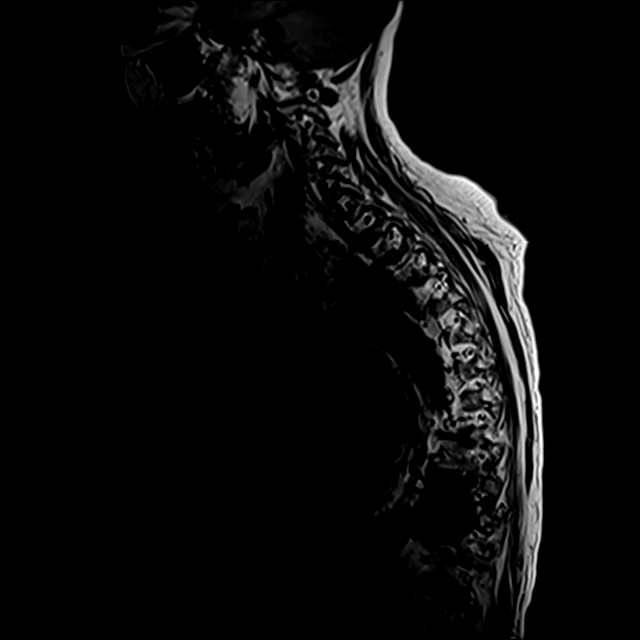

[Series 18: T1 · sagittal · 3.3mm · 0.62mm/px · 2 of 9 slices shown (3 of 3)]
[im 1/9]
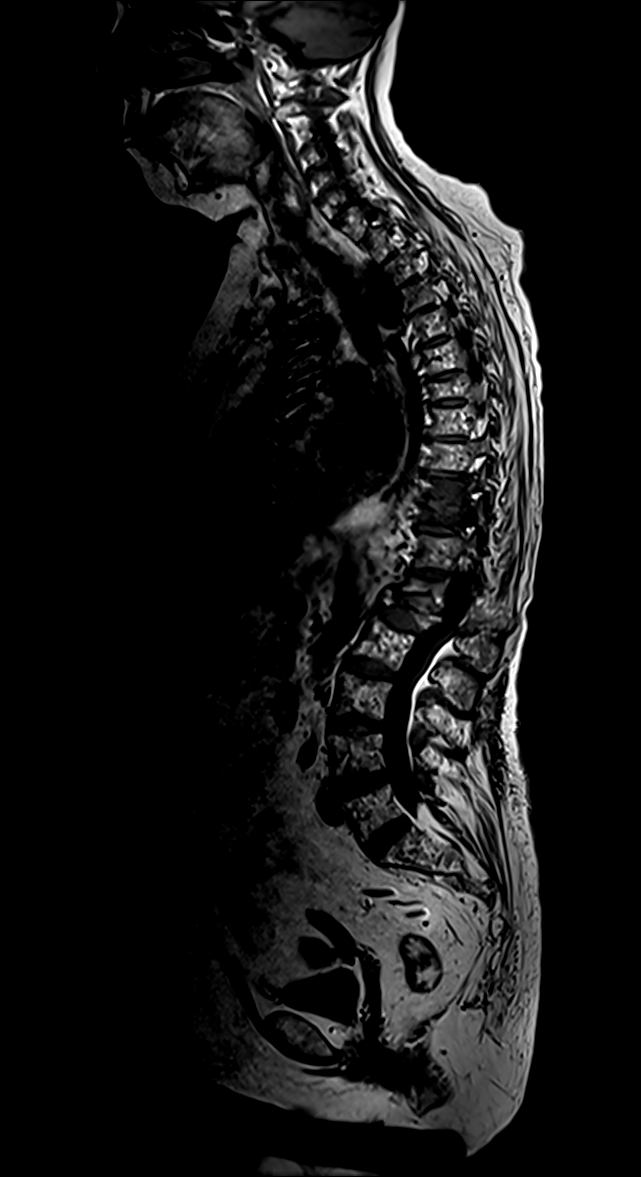
[im 9/9]
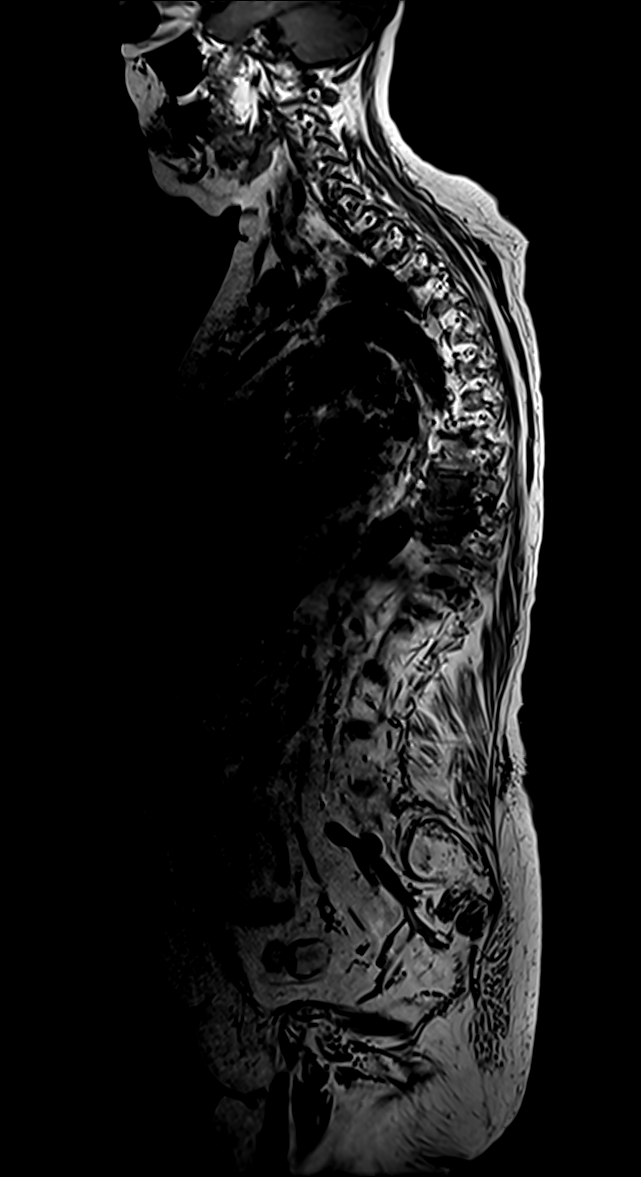

[Series 19: T2 · sagittal · 3.0mm · 0.76mm/px · 4 of 19 slices shown (1 of 2)]
[im 1/19]
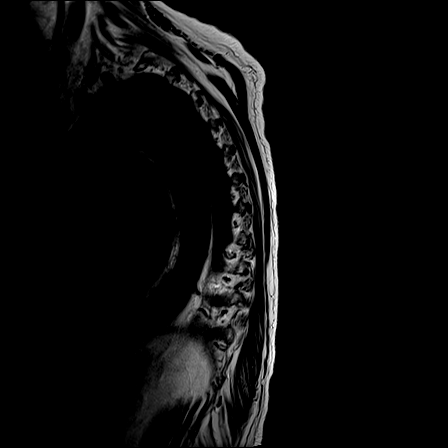
[im 7/19]
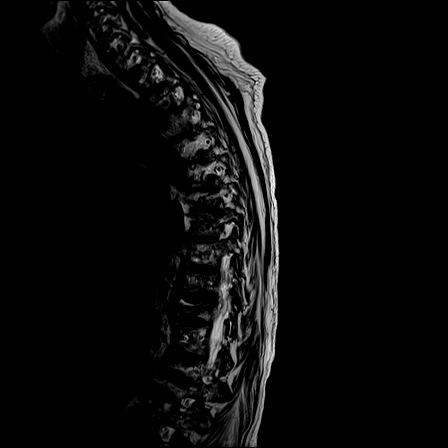
[im 13/19]
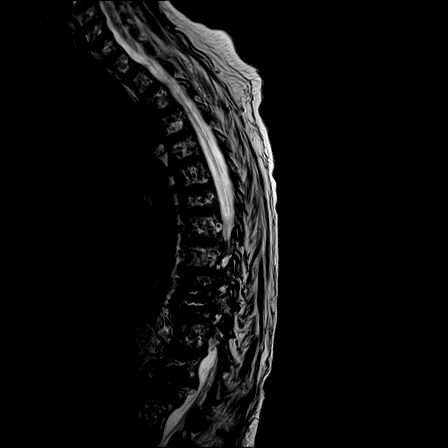
[im 19/19]
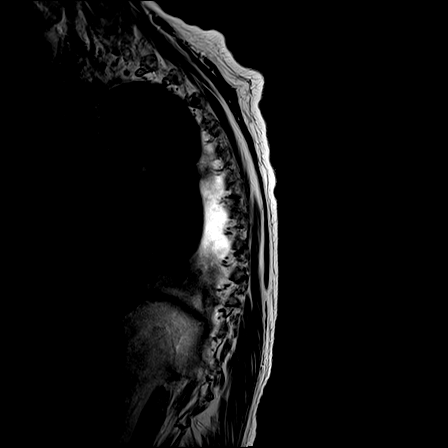

[Series 23: T2 · axial · 5.0mm · 0.59mm/px · z∈[-191,+1]mm · 9 of 41 slices shown (2 of 2)]
[im 1/41]
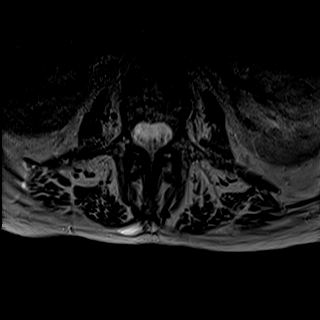
[im 6/41]
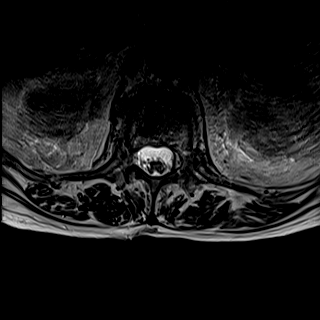
[im 11/41]
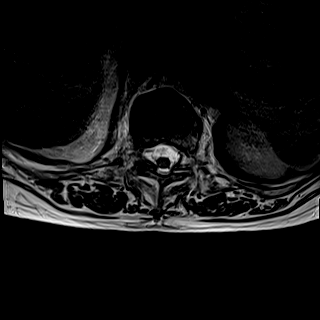
[im 16/41]
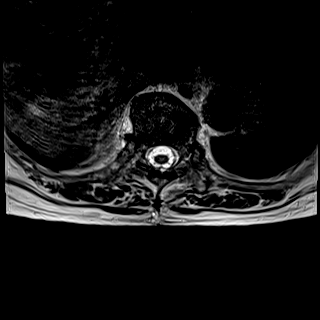
[im 21/41]
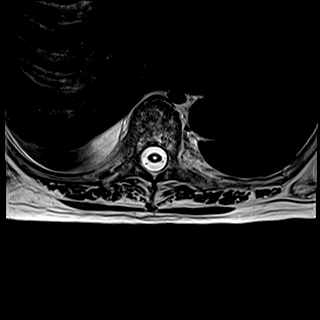
[im 26/41]
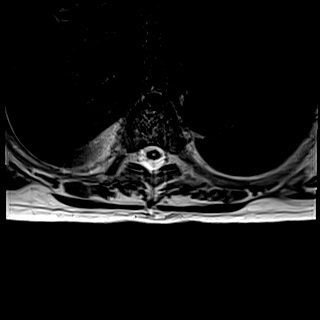
[im 31/41]
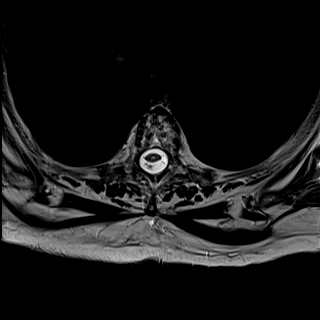
[im 36/41]
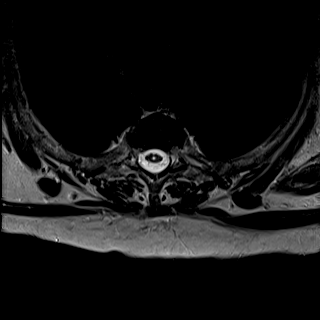
[im 41/41]
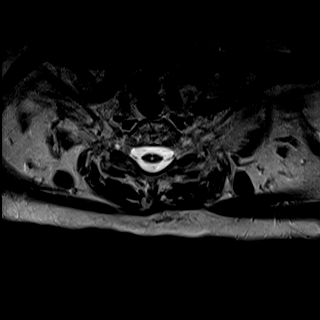

[18 of 48 positions shown; findings below may reference images not displayed]

FINDINGS: MRI THORACIC SPINE FINDINGS

Alignment: Trace sigmoid scoliosis. Alignment otherwise normal with
preservation of the normal thoracic kyphosis. No listhesis.

Vertebrae: Acute biconcave compression fracture seen involving the
T9 vertebral body. Associated mild 25% central height loss with
trace 3 mm bony retropulsion. Additional acute appearing compression
fracture involving the T10 vertebral body with up to 60% height loss
and 4 mm bony retropulsion. Associated marrow edema about these
fractures extend to involve the posterior elements.

Severe compression deformities involving the T12 and L1 vertebral
bodies with up to 80-90% height loss and 4 mm bony retropulsion are
late subacute to chronic in appearance with only trace residual
marrow edema. Additional mild chronic compression fracture involving
the T6 vertebral body with up to 30% height loss without bony
retropulsion.

Vertebral body height otherwise maintained with no other acute or
chronic fracture. Underlying bone marrow signal intensity is
markedly heterogeneous, nonspecific, but could reflect underlying
osteoporosis. No discrete or worrisome osseous lesions. No other
abnormal marrow edema.

Cord: Small syrinx involving the thoracic spinal cord measures up to
3 mm in maximal diameter, extending from the visualized upper cord
to approximately the T8-9 level. Signal intensity within the cord is
otherwise normal. Underlying normal cord caliber and morphology.

Paraspinal and other soft tissues: Mild paraspinous edema adjacent
to the acute T9 and T10 compression fractures. Paraspinous soft
tissues demonstrate no other acute finding. Moderate layering right
pleural effusion. Visualized visceral structures otherwise
unremarkable.

Disc levels:

Normal expected for age multilevel disc desiccation seen throughout
the thoracic spine. No significant disc bulge or focal disc
herniation. [C1] mm bony retropulsion at the levels of T10, T12,
and L1 related to the underlying compression fractures without
significant spinal stenosis. No other significant spinal stenosis
within the thoracic spine. Foramina remain patent.

MRI LUMBAR SPINE FINDINGS

Segmentation: Standard. Lowest well-formed disc space labeled the
L5-S1 level.

Alignment: Mild exaggeration of the normal lumbar lordosis. No
listhesis.

Vertebrae: Late subacute to chronic compression fractures involving
the T12 and L1 vertebral bodies again noted. Vertebral body height
otherwise maintained with no other acute, subacute, or chronic
compression fracture. Underlying bone marrow signal intensity is
markedly heterogeneous without discrete or worrisome osseous lesion.
No other abnormal marrow edema.

Conus medullaris and cauda equina: Conus extends to the T12-L1
level. Conus and cauda equina appear normal.

Paraspinal and other soft tissues: Chronic fatty atrophy noted
throughout the posterior paraspinous musculature. Paraspinous soft
tissues demonstrate no acute finding. 1.8 cm simple cyst noted
within the interpolar left kidney. Additional 9 mm T1 hyperintense
lesion at the inferior pole the left kidney noted, nonspecific, but
likely a small proteinaceous and/or hemorrhagic cyst. Hepatic
cirrhosis partially visualized.

Disc levels:

L1-2: Minimal annular disc bulge. Mild facet hypertrophy. No spinal
stenosis. Foramina remain patent.

L2-3: Disc desiccation with minimal annular disc bulge. Mild facet
hypertrophy. No stenosis.

L3-4: Minimal annular disc bulge. Mild facet hypertrophy. No canal
or foraminal stenosis.

L4-5: Disc desiccation with minimal annular disc bulge. Mild facet
hypertrophy. No stenosis.

L5-S1: Negative interspace. Mild facet hypertrophy. No canal or
foraminal stenosis.
IMPRESSION: 1. Acute compression fracture involving the T10 vertebral body with
up to 60% height loss and 4 mm bony retropulsion. No significant
spinal stenosis.
2. Acute compression fracture involving the T9 vertebral body with
up to 25% height loss without significant bony retropulsion.
3. Late subacute to chronic severe compression fractures involving
the T12 and L1 vertebral bodies with 80-90% height loss and up to 4
mm bony retropulsion. No significant spinal stenosis.
4. Markedly heterogeneous appearance of the visualized bone marrow,
nonspecific, but could reflect underlying osteoporosis.
5. Small syrinx measuring up to 3 mm involving the majority of the
thoracic spinal cord, terminating at the level of T8-9.
6. Moderate layering right pleural effusion.
7. Hepatic cirrhosis.

## 2020-10-09 IMAGING — US US ABDOMEN LIMITED RUQ/ASCITES
1 series · 14 of 25 positions shown · non-contrast
Comparison: CT abdomen pelvis [DATE]

CLINICAL DATA: Transaminitis

EXAM:
ULTRASOUND ABDOMEN LIMITED RIGHT UPPER QUADRANT

[Series 1: us abdomen limited ruq (liver/gb) · 14 of 25 slices shown]
[im 1/25]
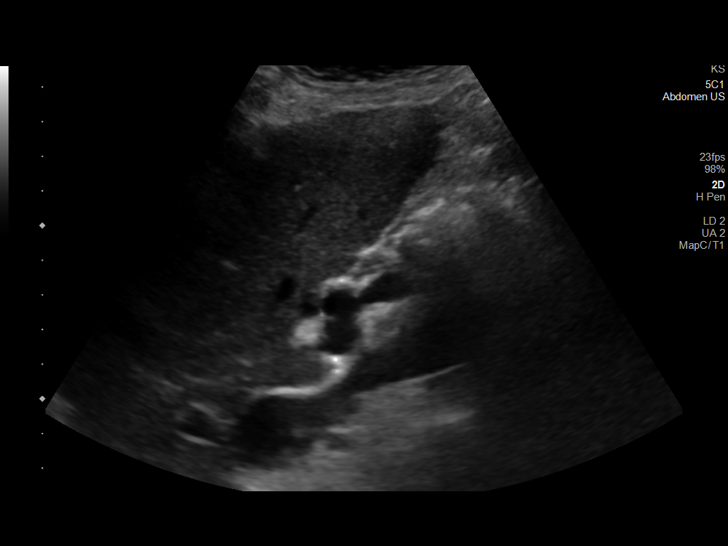
[im 3/25]
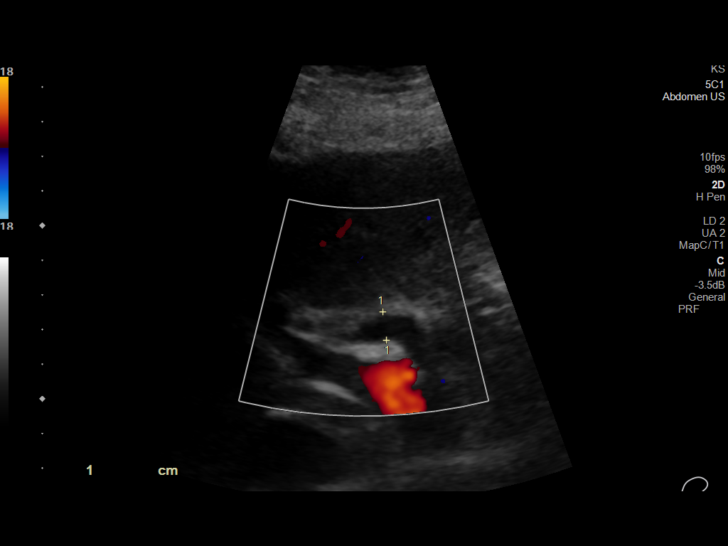
[im 5/25]
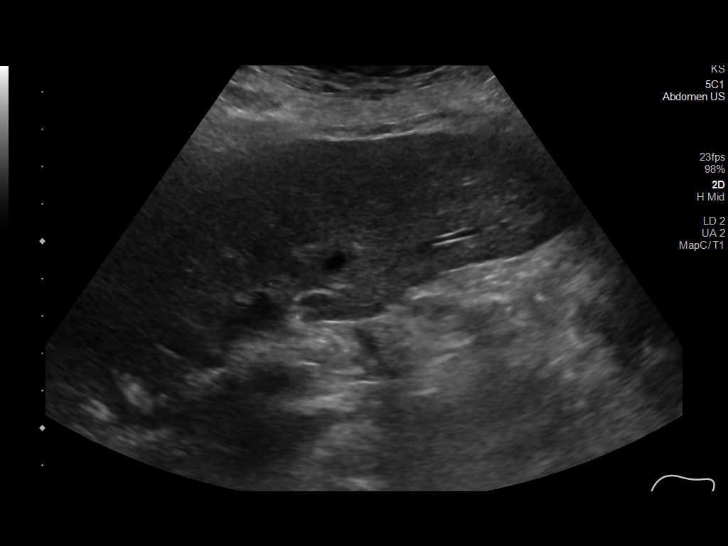
[im 7/25]
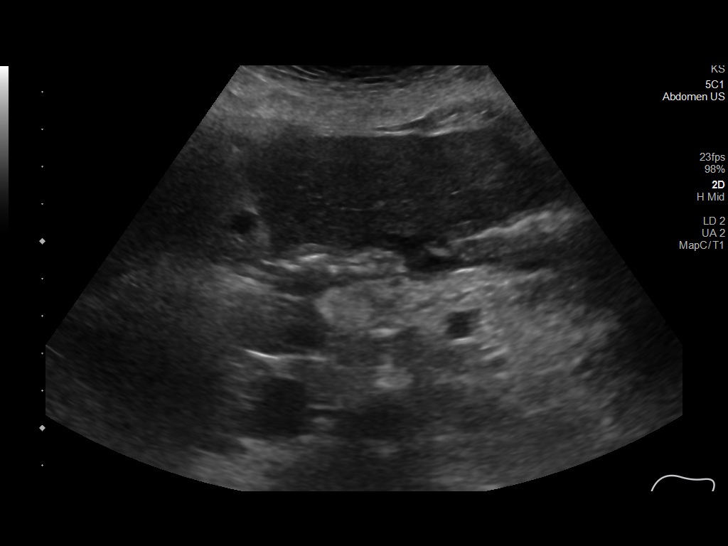
[im 9/25]
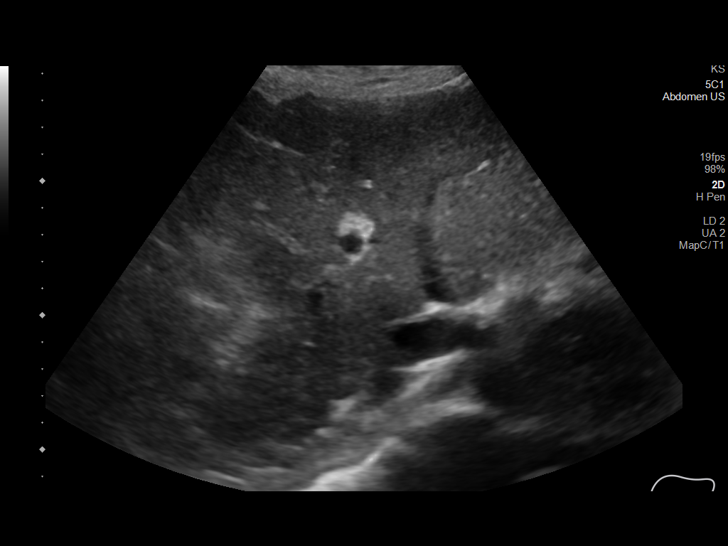
[im 10/25]
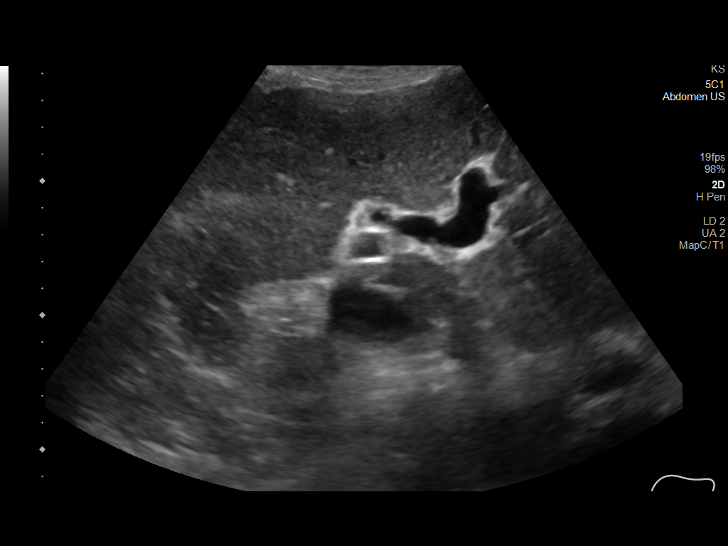
[im 12/25]
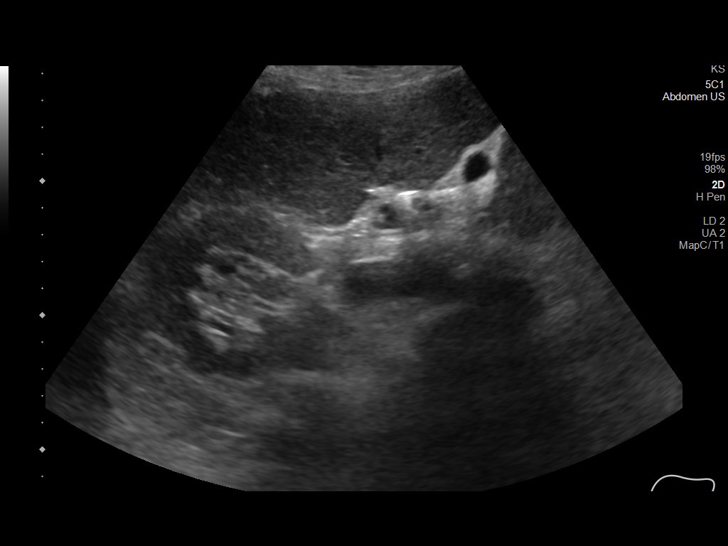
[im 14/25]
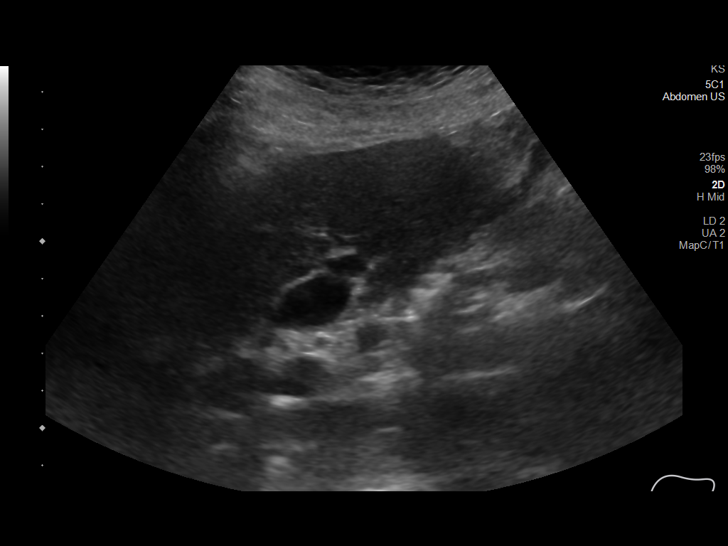
[im 16/25]
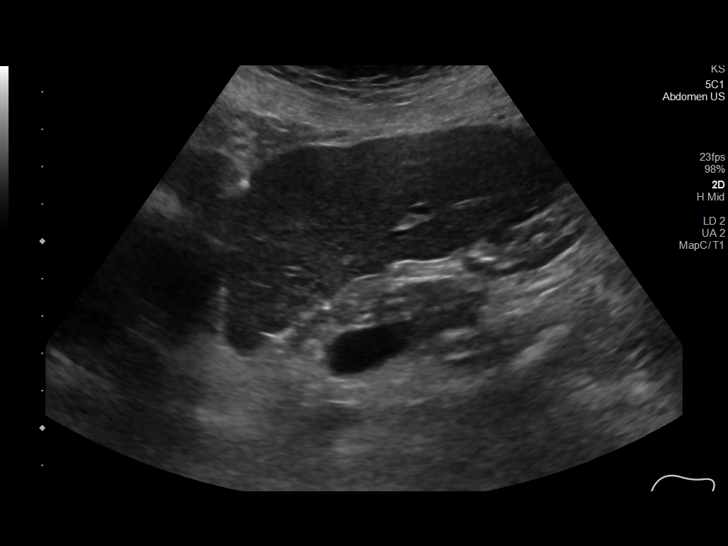
[im 17/25]
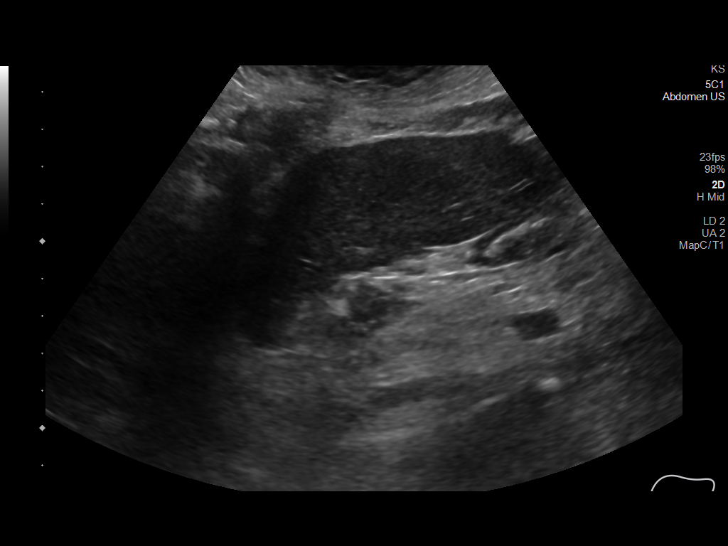
[im 19/25]
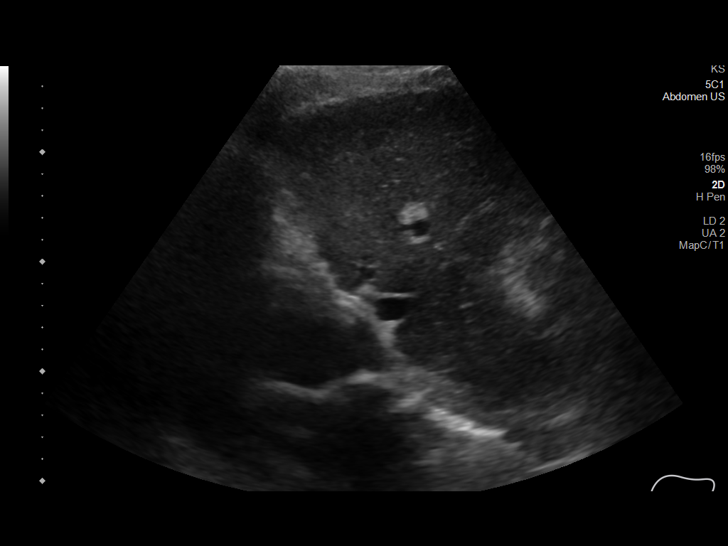
[im 21/25]
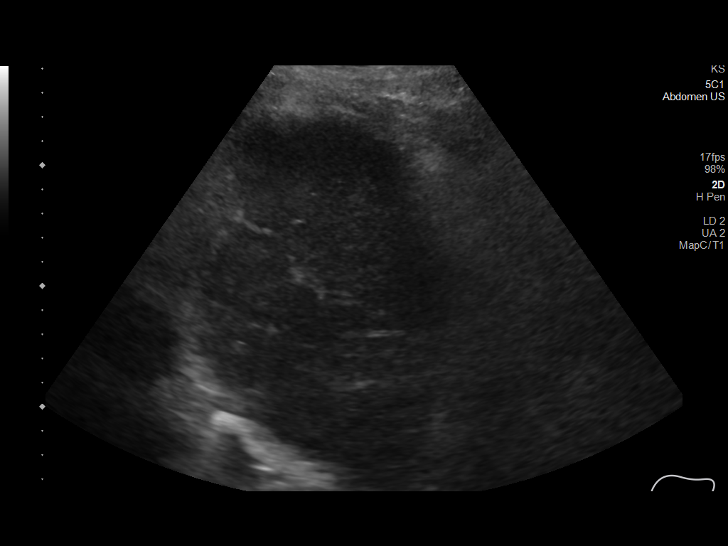
[im 23/25]
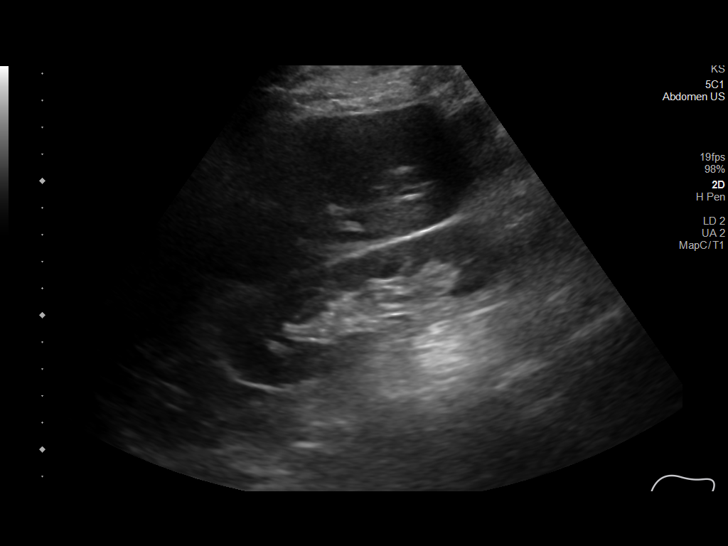
[im 25/25]
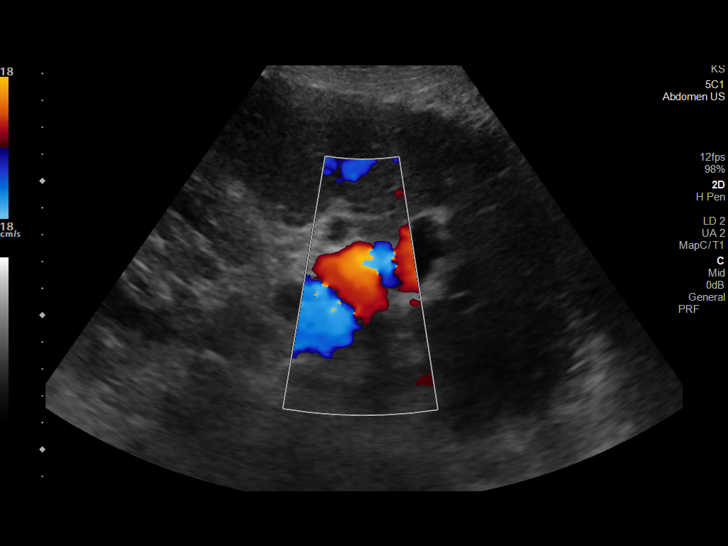

[14 of 25 positions shown; findings below may reference images not displayed]

FINDINGS: Gallbladder:

No gallstones or wall thickening visualized. No sonographic Murphy
sign noted by sonographer.

Common bile duct:

Diameter: 8 mm

Liver:

Nodular hepatic contour suspicious for cirrhosis. No focal hepatic
lesion. Portal vein is patent on color Doppler imaging with normal
direction of blood flow towards the liver.

Other: None.
IMPRESSION: Cirrhotic liver morphology without focal hepatic lesion.

## 2020-10-09 IMAGING — MR MR LUMBAR SPINE W/O CM
4 of 5 series · 26 of 48 positions shown · non-contrast
Comparison: Prior CT from [DATE].

CLINICAL DATA: Initial evaluation for acute spine fracture.

EXAM:
MRI THORACIC AND LUMBAR SPINE WITHOUT CONTRAST
TECHNIQUE: Multiplanar and multiecho pulse sequences of the thoracic and lumbar
spine were obtained without intravenous contrast.

[Series 1: T2 · sagittal · 4.0mm · 0.73mm/px · 8 of 16 slices shown (1 of 2)]
[im 1/16]
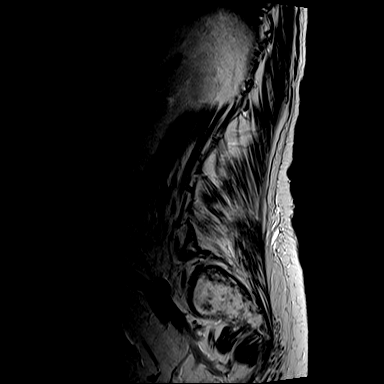
[im 3/16]
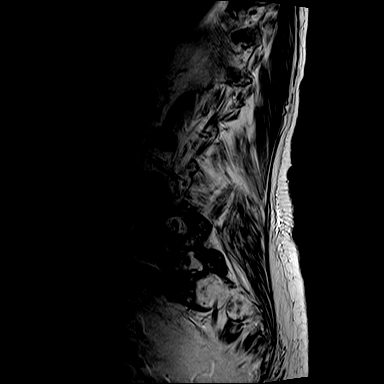
[im 5/16]
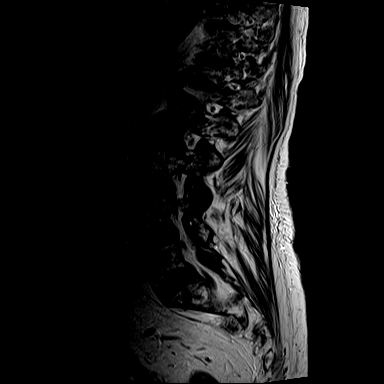
[im 7/16]
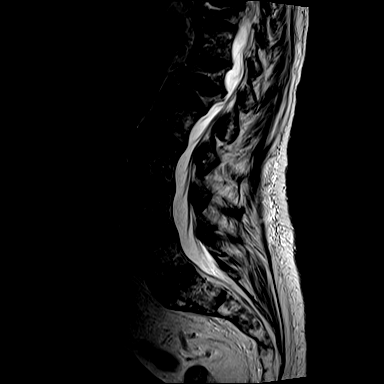
[im 9/16]
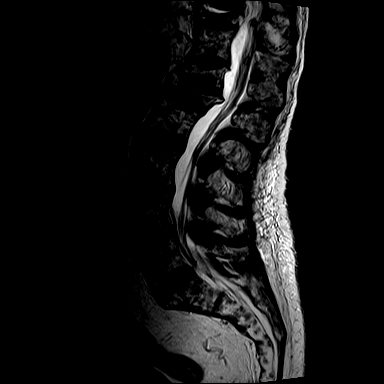
[im 11/16]
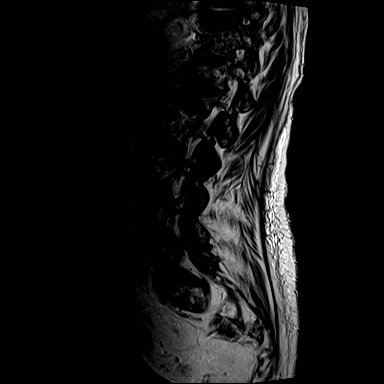
[im 13/16]
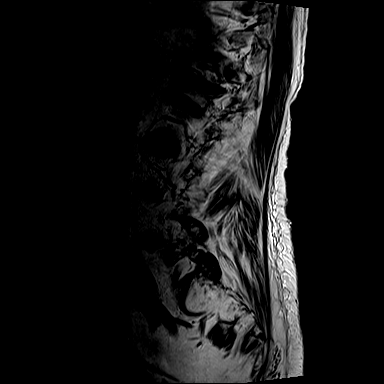
[im 16/16]
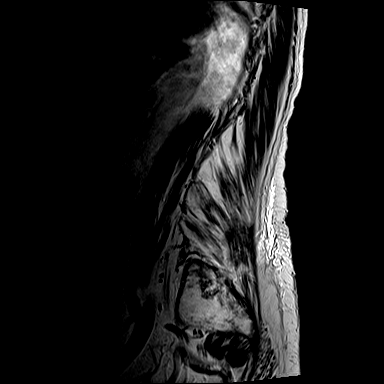

[Series 3: T1 · sagittal · 4.0mm · 0.88mm/px · 6 of 16 slices shown (1 of 2)]
[im 1/16]
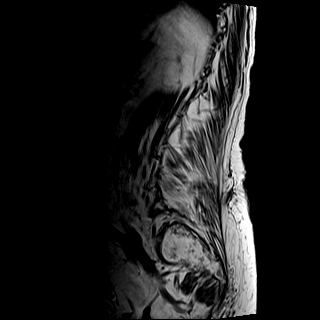
[im 3/16]
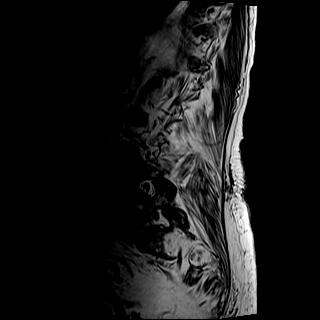
[im 5/16]
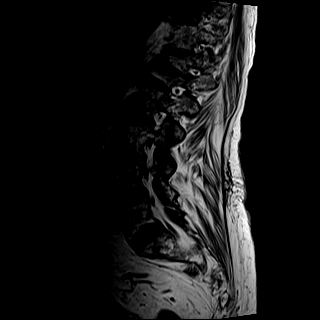
[im 7/16]
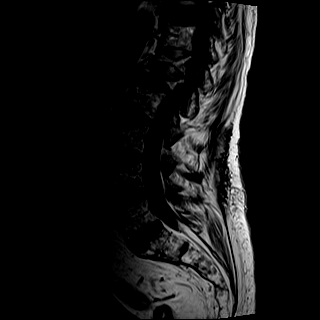
[im 9/16]
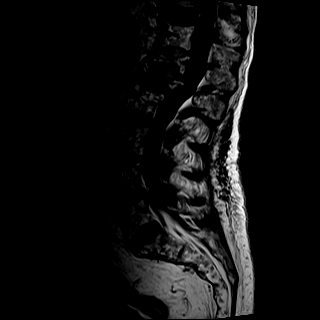
[im 13/16]
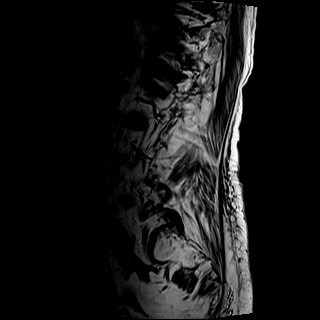

[Series 4: T2 · axial · 5.0mm · 0.57mm/px · z∈[-347,-122]mm · 9 of 26 slices shown (2 of 2)]
[im 1/26]
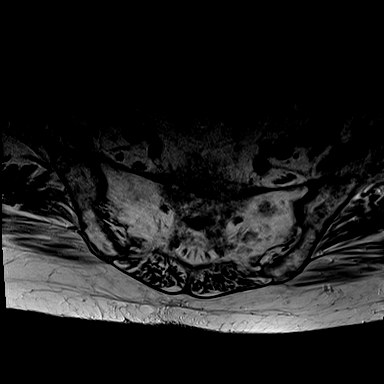
[im 5/26]
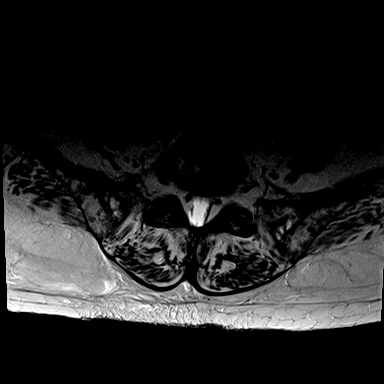
[im 7/26]
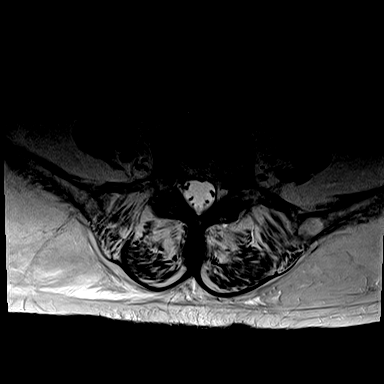
[im 12/26]
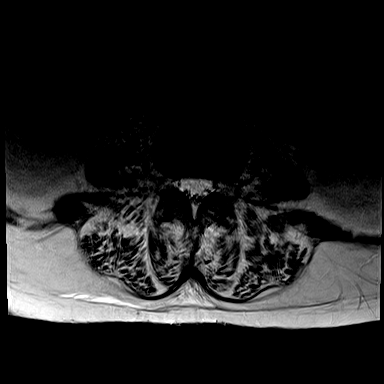
[im 14/26]
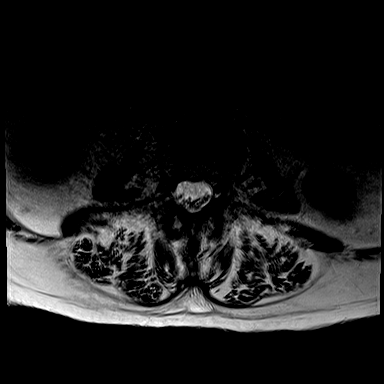
[im 19/26]
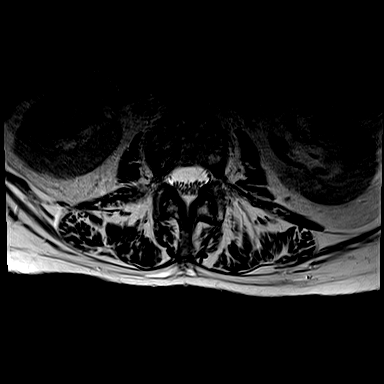
[im 21/26]
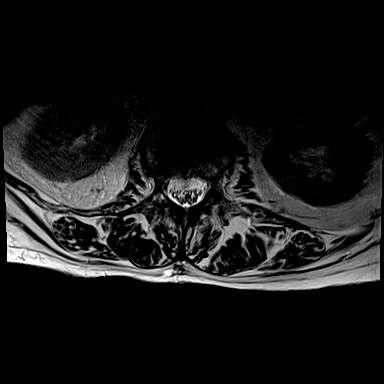
[im 23/26]
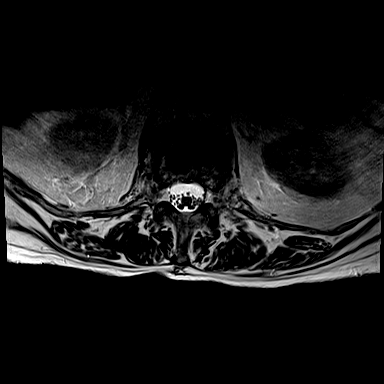
[im 26/26]
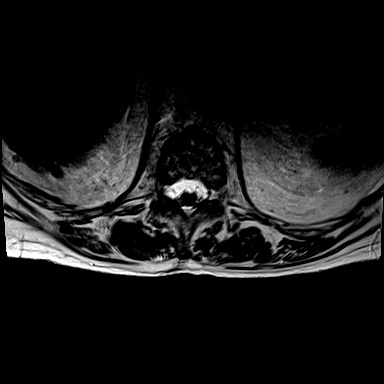

[Series 5: T1 · axial · 5.0mm · 0.34mm/px · z∈[-317,-143]mm · 3 of 26 slices shown (2 of 2)]
[im 5/26]
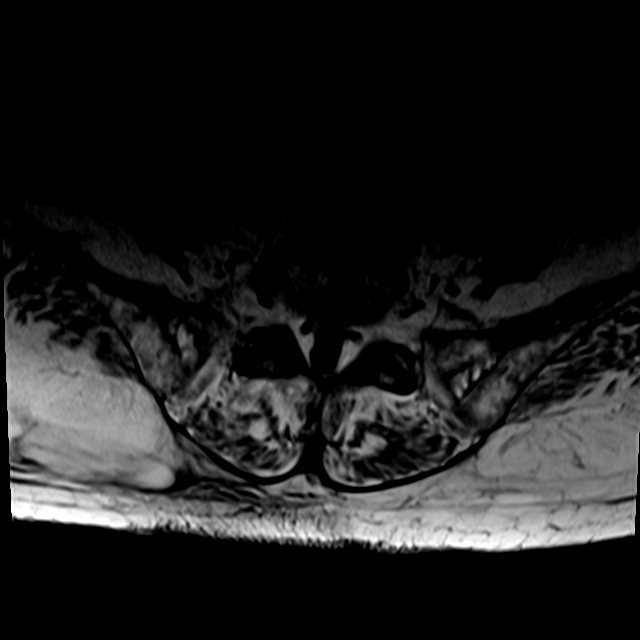
[im 14/26]
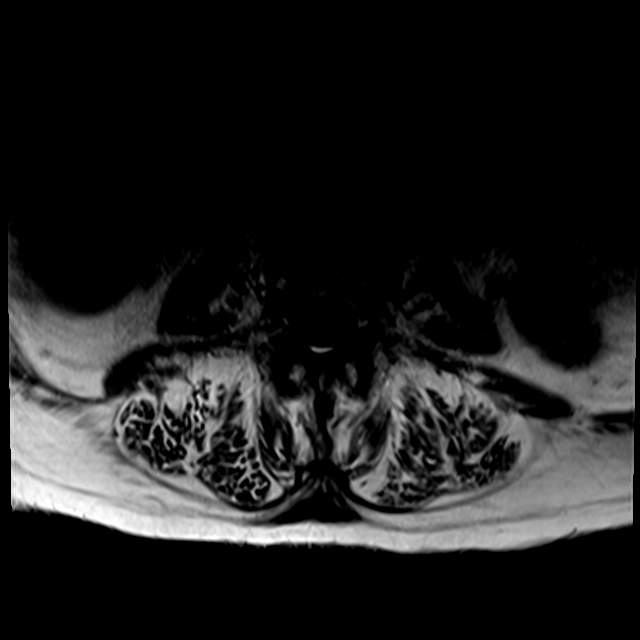
[im 23/26]
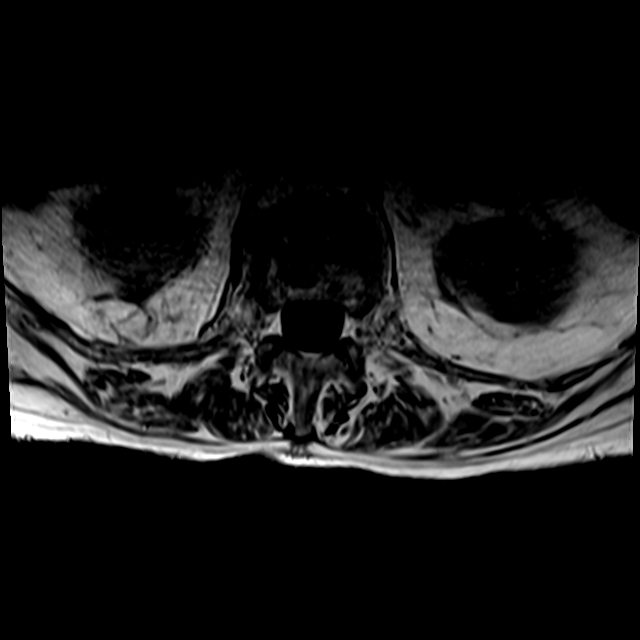

[26 of 48 positions shown; findings below may reference images not displayed]

FINDINGS: MRI THORACIC SPINE FINDINGS

Alignment: Trace sigmoid scoliosis. Alignment otherwise normal with
preservation of the normal thoracic kyphosis. No listhesis.

Vertebrae: Acute biconcave compression fracture seen involving the
T9 vertebral body. Associated mild 25% central height loss with
trace 3 mm bony retropulsion. Additional acute appearing compression
fracture involving the T10 vertebral body with up to 60% height loss
and 4 mm bony retropulsion. Associated marrow edema about these
fractures extend to involve the posterior elements.

Severe compression deformities involving the T12 and L1 vertebral
bodies with up to 80-90% height loss and 4 mm bony retropulsion are
late subacute to chronic in appearance with only trace residual
marrow edema. Additional mild chronic compression fracture involving
the T6 vertebral body with up to 30% height loss without bony
retropulsion.

Vertebral body height otherwise maintained with no other acute or
chronic fracture. Underlying bone marrow signal intensity is
markedly heterogeneous, nonspecific, but could reflect underlying
osteoporosis. No discrete or worrisome osseous lesions. No other
abnormal marrow edema.

Cord: Small syrinx involving the thoracic spinal cord measures up to
3 mm in maximal diameter, extending from the visualized upper cord
to approximately the T8-9 level. Signal intensity within the cord is
otherwise normal. Underlying normal cord caliber and morphology.

Paraspinal and other soft tissues: Mild paraspinous edema adjacent
to the acute T9 and T10 compression fractures. Paraspinous soft
tissues demonstrate no other acute finding. Moderate layering right
pleural effusion. Visualized visceral structures otherwise
unremarkable.

Disc levels:

Normal expected for age multilevel disc desiccation seen throughout
the thoracic spine. No significant disc bulge or focal disc
herniation. [C1] mm bony retropulsion at the levels of T10, T12,
and L1 related to the underlying compression fractures without
significant spinal stenosis. No other significant spinal stenosis
within the thoracic spine. Foramina remain patent.

MRI LUMBAR SPINE FINDINGS

Segmentation: Standard. Lowest well-formed disc space labeled the
L5-S1 level.

Alignment: Mild exaggeration of the normal lumbar lordosis. No
listhesis.

Vertebrae: Late subacute to chronic compression fractures involving
the T12 and L1 vertebral bodies again noted. Vertebral body height
otherwise maintained with no other acute, subacute, or chronic
compression fracture. Underlying bone marrow signal intensity is
markedly heterogeneous without discrete or worrisome osseous lesion.
No other abnormal marrow edema.

Conus medullaris and cauda equina: Conus extends to the T12-L1
level. Conus and cauda equina appear normal.

Paraspinal and other soft tissues: Chronic fatty atrophy noted
throughout the posterior paraspinous musculature. Paraspinous soft
tissues demonstrate no acute finding. 1.8 cm simple cyst noted
within the interpolar left kidney. Additional 9 mm T1 hyperintense
lesion at the inferior pole the left kidney noted, nonspecific, but
likely a small proteinaceous and/or hemorrhagic cyst. Hepatic
cirrhosis partially visualized.

Disc levels:

L1-2: Minimal annular disc bulge. Mild facet hypertrophy. No spinal
stenosis. Foramina remain patent.

L2-3: Disc desiccation with minimal annular disc bulge. Mild facet
hypertrophy. No stenosis.

L3-4: Minimal annular disc bulge. Mild facet hypertrophy. No canal
or foraminal stenosis.

L4-5: Disc desiccation with minimal annular disc bulge. Mild facet
hypertrophy. No stenosis.

L5-S1: Negative interspace. Mild facet hypertrophy. No canal or
foraminal stenosis.
IMPRESSION: 1. Acute compression fracture involving the T10 vertebral body with
up to 60% height loss and 4 mm bony retropulsion. No significant
spinal stenosis.
2. Acute compression fracture involving the T9 vertebral body with
up to 25% height loss without significant bony retropulsion.
3. Late subacute to chronic severe compression fractures involving
the T12 and L1 vertebral bodies with 80-90% height loss and up to 4
mm bony retropulsion. No significant spinal stenosis.
4. Markedly heterogeneous appearance of the visualized bone marrow,
nonspecific, but could reflect underlying osteoporosis.
5. Small syrinx measuring up to 3 mm involving the majority of the
thoracic spinal cord, terminating at the level of T8-9.
6. Moderate layering right pleural effusion.
7. Hepatic cirrhosis.

## 2020-10-09 MED ORDER — LEVOTHYROXINE SODIUM 25 MCG PO TABS
137.0000 ug | ORAL_TABLET | Freq: Every day | ORAL | Status: DC
  Administered 2020-10-10: 137 ug via ORAL
  Filled 2020-10-09: qty 1

## 2020-10-09 MED ORDER — IPRATROPIUM-ALBUTEROL 0.5-2.5 (3) MG/3ML IN SOLN: 3.0000 mL | RESPIRATORY_TRACT | Status: DC | PRN

## 2020-10-09 MED ORDER — OMEGA-3-ACID ETHYL ESTERS 1 G PO CAPS
1.0000 g | ORAL_CAPSULE | Freq: Every day | ORAL | Status: DC
  Administered 2020-10-09 – 2020-10-18 (×10): 1 g via ORAL
  Filled 2020-10-09 (×10): qty 1

## 2020-10-09 MED ORDER — OXYCODONE HCL 5 MG PO TABS
5.0000 mg | ORAL_TABLET | ORAL | Status: DC | PRN
  Administered 2020-10-09 – 2020-10-10 (×2): 5 mg via ORAL
  Filled 2020-10-09 (×2): qty 1

## 2020-10-09 MED ORDER — IPRATROPIUM-ALBUTEROL 0.5-2.5 (3) MG/3ML IN SOLN
3.0000 mL | Freq: Four times a day (QID) | RESPIRATORY_TRACT | Status: DC
  Administered 2020-10-09: 3 mL via RESPIRATORY_TRACT
  Filled 2020-10-09: qty 3

## 2020-10-09 MED ORDER — HYDRALAZINE HCL 10 MG PO TABS
10.0000 mg | ORAL_TABLET | Freq: Three times a day (TID) | ORAL | Status: DC
  Administered 2020-10-09 – 2020-10-10 (×5): 10 mg via ORAL
  Filled 2020-10-09 (×5): qty 1

## 2020-10-09 MED ORDER — PREDNISONE 20 MG PO TABS
40.0000 mg | ORAL_TABLET | Freq: Every day | ORAL | Status: DC
  Administered 2020-10-09 – 2020-10-11 (×3): 40 mg via ORAL
  Filled 2020-10-09 (×3): qty 2

## 2020-10-09 MED ORDER — CITALOPRAM HYDROBROMIDE 20 MG PO TABS
20.0000 mg | ORAL_TABLET | Freq: Every day | ORAL | Status: DC
  Administered 2020-10-09 – 2020-10-18 (×10): 20 mg via ORAL
  Filled 2020-10-09 (×10): qty 1

## 2020-10-09 MED ORDER — POTASSIUM CHLORIDE CRYS ER 20 MEQ PO TBCR
40.0000 meq | EXTENDED_RELEASE_TABLET | Freq: Once | ORAL | Status: AC
  Administered 2020-10-09: 40 meq via ORAL
  Filled 2020-10-09: qty 2

## 2020-10-09 MED ORDER — SODIUM CHLORIDE 0.9 % IV SOLN: INTRAVENOUS | Status: DC

## 2020-10-09 MED ORDER — POLYETHYLENE GLYCOL 3350 17 G PO PACK: 17.0000 g | PACK | Freq: Every day | ORAL | Status: DC | PRN

## 2020-10-09 MED ORDER — SENNOSIDES-DOCUSATE SODIUM 8.6-50 MG PO TABS
1.0000 | ORAL_TABLET | Freq: Every evening | ORAL | Status: DC | PRN
  Administered 2020-10-10: 1 via ORAL
  Filled 2020-10-09: qty 1

## 2020-10-09 MED ORDER — ACETAMINOPHEN 325 MG PO TABS
650.0000 mg | ORAL_TABLET | Freq: Four times a day (QID) | ORAL | Status: DC | PRN
  Administered 2020-10-17 – 2020-10-18 (×2): 650 mg via ORAL
  Filled 2020-10-09 (×2): qty 2

## 2020-10-09 MED ORDER — DM-GUAIFENESIN ER 30-600 MG PO TB12: 1.0000 | ORAL_TABLET | Freq: Two times a day (BID) | ORAL | Status: DC | PRN

## 2020-10-09 MED ORDER — LISINOPRIL 40 MG PO TABS
40.0000 mg | ORAL_TABLET | Freq: Every day | ORAL | Status: DC
  Administered 2020-10-09 – 2020-10-18 (×10): 40 mg via ORAL
  Filled 2020-10-09 (×10): qty 1

## 2020-10-09 NOTE — Progress Notes (Signed)
PROGRESS NOTE    [name]  LGX:[id number] DOB: 05-Mar-1951 DOA: 10/08/2020 PCP: [name], MD   Brief Narrative:  70 year old with history of HTN, hypothyroidism, GERD, liver cirrhosis presents with abdominal and low back pain.  Upon admission patient had hyponatremia, hypokalemia, hypomagnesemia CT abdomen pelvis showed cirrhosis with portal hypertension, right pleural effusion with interval development of T10, T8 T12 and L1 compression fracture.   Assessment & Plan:   Principal Problem:   Back pain Active Problems:   HYPERTENSION, BENIGN ESSENTIAL   Hyponatremia   Hypothyroidism   Hypokalemia due to excessive gastrointestinal loss of potassium   Abdominal pain   Hypomagnesemia   Compression fracture of body of thoracic vertebra (HCC)   History of cirrhosis of liver    Back pain due Thoracic compression fracture -Significant amount of pain.  CT abdomen pelvis shows compression fracture T10, T12 and L1 -Pain control, bowel regimen, PT/OT -IR consulted -Check vitamin D levels -MRI lumbar and thoracic spine ordered  Abdominal pain Liver cirrhosis with history of alcohol use History of cholecystectomy -Continue PPI.  Pain control -LFTs relatively normal, lipase normal -Right upper quadrant ultrasound -Acute hepatitis panel -Echo  Daily tobacco use with history of COPD, mild exacerbation -Diffuse diminished breath sounds.  Will order bronchodilators and steroids.  Hyponatremia, hypovolemia -Urine sodium less than 10.  Will provide gentle hydration and closely monitor sodium levels  Hypokalemia -Replete as needed  Hypomagnesemia -Replete as needed  Hypothyroidism -Continue Synthroid  Essential hypertension -Continue hydralazine and lisinopril  Hyperlipidemia -Continue Lovaza   DVT prophylaxis: SCDs Start: 10/08/20 2128 Code Status: Full code Family Communication:    Status is: Inpatient  Remains inpatient appropriate  because:Inpatient level of care appropriate due to severity of illness.  Need evaluation for thoracic fractures causing severe pain and difficulty ambulation.  Also undergoing evaluation for new diagnosis of cirrhosis   Dispo: The patient is from: Home              Anticipated d/c is to: Home              Patient currently is not medically stable to d/c.   Difficult to place patient No      Subjective: Seen and examined at bedside, no new complaints besides back pain.  Admits of drinking daily.  Review of Systems Otherwise negative except as per HPI, including: General: Denies fever, chills, night sweats or unintended weight loss. Resp: Denies cough, wheezing, shortness of breath. Cardiac: Denies chest pain, palpitations, orthopnea, paroxysmal nocturnal dyspnea. GI: Denies abdominal pain, nausea, vomiting, diarrhea or constipation GU: Denies dysuria, frequency, hesitancy or incontinence MS: Denies muscle aches, joint pain or swelling Neuro: Denies headache, neurologic deficits (focal weakness, numbness, tingling), abnormal gait Psych: Denies anxiety, depression, SI/HI/AVH Skin: Denies new rashes or lesions ID: Denies sick contacts, exotic exposures, travel  Examination:  General exam: Appears calm and comfortable  Respiratory system: Bilateral diminished breath sounds. Cardiovascular system: S1 & S2 heard, RRR. No JVD, murmurs, rubs, gallops or clicks. No pedal edema. Gastrointestinal system: Abdomen is nondistended, soft and nontender. No organomegaly or masses felt. Normal bowel sounds heard. Central nervous system: Alert and oriented. No focal neurological deficits. Extremities: Symmetric 5 x 5 power. Skin: No rashes, lesions or ulcers Psychiatry: Judgement and insight appear normal. Mood & affect appropriate.     Objective: Vitals:   10/08/20 2215 10/08/20 2325 10/09/20 0005 10/09/20 0447  BP: (!) 139/102 (!) 163/99  (!) 159/96  Pulse: 93 65  78  Resp: (!) 22 (!) 26   20  Temp: 98.4 F (36.9 C) 98.5 F (36.9 C)  98.1 F (36.7 C)  TempSrc: Oral Oral  Oral  SpO2: 92% 99%  100%  Weight:   81.4 kg   Height:   5\' 4"  (1.626 m)     Intake/Output Summary (Last 24 hours) at 10/09/2020 0723 Last data filed at 10/08/2020 2330 Gross per 24 hour  Intake 740 ml  Output 150 ml  Net 590 ml   Filed Weights   10/09/20 0005  Weight: 81.4 kg     Data Reviewed:   CBC: Recent Labs  Lab 10/08/20 1641 10/09/20 0020  WBC 8.7 8.9  NEUTROABS 7.1  --   HGB 14.0 13.0  HCT RESULTS UNAVAILABLE DUE TO INTERFERING SUBSTANCE RESULTS UNAVAILABLE DUE TO INTERFERING SUBSTANCE  MCV RESULTS UNAVAILABLE DUE TO INTERFERING SUBSTANCE RESULTS UNAVAILABLE DUE TO INTERFERING SUBSTANCE  PLT 213 295   Basic Metabolic Panel: Recent Labs  Lab 10/08/20 1454 10/08/20 1641 10/09/20 0020  NA 124*  --  124*  K 2.5*  --  3.6  CL 81*  --  85*  CO2 30  --  29  GLUCOSE 109*  --  106*  BUN <5*  --  <5*  CREATININE 0.47  --  0.51  CALCIUM 8.7*  --  8.2*  MG  --  1.1* 2.6*  PHOS  --   --  3.4   GFR: Estimated Creatinine Clearance: 66.6 mL/min (by C-G formula based on SCr of 0.51 mg/dL). Liver Function Tests: Recent Labs  Lab 10/08/20 1454 10/09/20 0020  AST 26 25  ALT 15 15  ALKPHOS 130* 127*  BILITOT 1.0 1.2  PROT 6.0* 5.9*  ALBUMIN 3.3* 3.2*   Recent Labs  Lab 10/08/20 1454  LIPASE 29   No results for input(s): AMMONIA in the last 168 hours. Coagulation Profile: Recent Labs  Lab 10/09/20 0020  INR 1.0   Cardiac Enzymes: No results for input(s): CKTOTAL, CKMB, CKMBINDEX, TROPONINI in the last 168 hours. BNP (last 3 results) No results for input(s): PROBNP in the last 8760 hours. HbA1C: No results for input(s): HGBA1C in the last 72 hours. CBG: No results for input(s): GLUCAP in the last 168 hours. Lipid Profile: No results for input(s): CHOL, HDL, LDLCALC, TRIG, CHOLHDL, LDLDIRECT in the last 72 hours. Thyroid Function Tests: No results for input(s):  TSH, T4TOTAL, FREET4, T3FREE, THYROIDAB in the last 72 hours. Anemia Panel: No results for input(s): VITAMINB12, FOLATE, FERRITIN, TIBC, IRON, RETICCTPCT in the last 72 hours. Sepsis Labs: Recent Labs  Lab 10/08/20 1642 10/08/20 2258  LATICACIDVEN 1.8 1.2    Recent Results (from the past 240 hour(s))  Resp Panel by RT-PCR (Flu A&B, Covid) Nasopharyngeal Swab     Status: None   Collection Time: 10/08/20  5:33 PM   Specimen: Nasopharyngeal Swab; Nasopharyngeal(NP) swabs in vial transport medium  Result Value Ref Range Status   SARS Coronavirus 2 by RT PCR NEGATIVE NEGATIVE Final    Comment: (NOTE) SARS-CoV-2 target nucleic acids are NOT DETECTED.  The SARS-CoV-2 RNA is generally detectable in upper respiratory specimens during the acute phase of infection. The lowest concentration of SARS-CoV-2 viral copies this assay can detect is 138 copies/mL. A negative result does not preclude SARS-Cov-2 infection and should not be used as the sole basis for treatment or other patient management decisions. A negative result may occur with  improper specimen collection/handling, submission of specimen other than nasopharyngeal swab, presence of viral  mutation(s) within the areas targeted by this assay, and inadequate number of viral copies(<138 copies/mL). A negative result must be combined with clinical observations, patient history, and epidemiological information. The expected result is Negative.  Fact Sheet for Patients:  EntrepreneurPulse.com.au  Fact Sheet for Healthcare Providers:  IncredibleEmployment.be  This test is no t yet approved or cleared by the [place] FDA and  has been authorized for detection and/or diagnosis of SARS-CoV-2 by FDA under an Emergency Use Authorization (EUA). This EUA will remain  in effect (meaning this test can be used) for the duration of the COVID-19 declaration under Section 564(b)(1) of the Act,  21 U.S.C.section 360bbb-3(b)(1), unless the authorization is terminated  or revoked sooner.       Influenza A by PCR NEGATIVE NEGATIVE Final   Influenza B by PCR NEGATIVE NEGATIVE Final    Comment: (NOTE) The Xpert Xpress SARS-CoV-2/FLU/RSV plus assay is intended as an aid in the diagnosis of influenza from Nasopharyngeal swab specimens and should not be used as a sole basis for treatment. Nasal washings and aspirates are unacceptable for Xpert Xpress SARS-CoV-2/FLU/RSV testing.  Fact Sheet for Patients: EntrepreneurPulse.com.au  Fact Sheet for Healthcare Providers: IncredibleEmployment.be  This test is not yet approved or cleared by the [place] FDA and has been authorized for detection and/or diagnosis of SARS-CoV-2 by FDA under an Emergency Use Authorization (EUA). This EUA will remain in effect (meaning this test can be used) for the duration of the COVID-19 declaration under Section 564(b)(1) of the Act, 21 U.S.C. section 360bbb-3(b)(1), unless the authorization is terminated or revoked.  Performed at [facility], [place] [address] [place], [place] [address]          Radiology Studies: CT Abdomen Pelvis W Contrast  Result Date: 10/08/2020 CLINICAL DATA:  Nonlocalized acute abdominal pain. Left-sided abdominal pain for 2 months. EXAM: CT ABDOMEN AND PELVIS WITH CONTRAST TECHNIQUE: Multidetector CT imaging of the abdomen and pelvis was performed using the standard protocol following bolus administration of intravenous contrast. CONTRAST:  162mL OMNIPAQUE IOHEXOL 300 MG/ML  SOLN COMPARISON:  CT abdomen pelvis 12/05/2015 FINDINGS: Lower chest: Trace right pleural effusion with associated right lower lobe passive atelectasis. Mitral annular calcifications. Possible tiny hiatal hernia. Hepatobiliary: Nodular hepatic contour. The hepatic parenchyma is diffusely hypodense compared to the splenic parenchyma consistent with fatty  infiltration. No focal liver abnormality. Status post cholecystectomy. No biliary dilatation. Pancreas: No focal lesion. Normal pancreatic contour. No surrounding inflammatory changes. No main pancreatic ductal dilatation. Spleen: Normal in size without focal abnormality. Adrenals/Urinary Tract: No adrenal nodule bilaterally. Bilateral kidneys enhance symmetrically. 1.7 cm fluid density lesion within the left kidney likely represents a simple renal cyst. No hydronephrosis. No hydroureter. The urinary bladder is unremarkable. On delayed imaging, there is no urothelial wall thickening and there are no filling defects in the opacified portions of the bilateral collecting systems or ureters. Stomach/Bowel: Stomach is within normal limits. No evidence of bowel wall thickening or dilatation. Appendix appears normal. Vascular/Lymphatic: Recanalized paraumbilical vein. Venous collateral are noted. Slight left ovarian venous congestion. The main portal, splenic, superior mesenteric veins are patent. No abdominal aorta or iliac aneurysm. Moderate to severe atherosclerotic plaque of the aorta and its branches. No abdominal, pelvic, or inguinal lymphadenopathy. Reproductive: Uterus and bilateral adnexa are unremarkable. Other: No intraperitoneal free fluid. No intraperitoneal free gas. No organized fluid collection. Musculoskeletal: No abdominal wall hernia or abnormality. Diffusely decreased bone density. No suspicious lytic or blastic osseous lesions. No acute displaced fracture.  Multilevel degenerative changes of the spine with interval development of compression fractures of the T10, T12, L1 with greater than 90% height loss. IMPRESSION: 1. Cirrhosis with portal hypertension. No definite focal hepatic lesion; however limited evaluation on this single phase contrast study. Recommend MRI liver protocol for further evaluation. 2. Trace volume right pleural effusion. 3. Possible tiny hiatal hernia. 4. Interval development of  T10, T12, L1 compression fractures with greater than 90% height loss. Correlate with point tenderness to palpation to evaluate for acute component. 5. Aortic Atherosclerosis (ICD10-I70.0) including mitral annular calcifications. Electronically Signed   By: Iven Finn M.D.   On: 10/08/2020 19:58        Scheduled Meds: . citalopram  20 mg Oral Daily  . feeding supplement  237 mL Oral BID BM  . hydrALAZINE  10 mg Oral TID  . levothyroxine  137 mcg Oral Q0600  . lisinopril  40 mg Oral Daily  . omega-3 acid ethyl esters  1 g Oral Daily  . pantoprazole (PROTONIX) IV  40 mg Intravenous QHS   Continuous Infusions:   LOS: 1 day   Time spent= 35 mins    Taylor Nettleton Arsenio Loader, MD [facility]  If 7PM-7AM, please contact night-coverage  10/09/2020, 7:23 AM

## 2020-10-09 NOTE — Progress Notes (Signed)
   10/08/20 2325  Assess: MEWS Score  Temp 98.5 F (36.9 C)  BP (!) 163/99  Pulse Rate 65  Resp (!) 26  Level of Consciousness Alert  SpO2 99 %  O2 Device Nasal Cannula  O2 Flow Rate (L/min) 2 L/min  Assess: MEWS Score  MEWS Temp 0  MEWS Systolic 0  MEWS Pulse 0  MEWS RR 2  MEWS LOC 0  MEWS Score 2  MEWS Score Color Yellow  Assess: if the MEWS score is Yellow or Red  Were vital signs taken at a resting state? Yes  Focused Assessment No change from prior assessment  Early Detection of Sepsis Score *See Row Information* Low  MEWS guidelines implemented *See Row Information* No, previously yellow, continue vital signs every 4 hours  Treat  Pain Scale 0-10  Pain Score 10  Escalate  MEWS: Escalate Yellow: discuss with charge nurse/RN and consider discussing with provider and RRT  Notify: Charge Nurse/RN  Name of Charge Nurse/RN Notified Fred RN  Date Charge Nurse/RN Notified 10/08/20  Time Charge Nurse/RN Notified 2330  Document  Patient Outcome Not stable and remains on department  Progress note created (see row info) Yes

## 2020-10-09 NOTE — Evaluation (Signed)
Physical Therapy Evaluation Patient Details Name: [name] MRN: [id number] DOB: Jan 15, 1951 Today's Date: 10/09/2020   History of Present Illness  Patient is a 70 y/o female who presents on 10/09/20 with low back/abdominal pain and new urinary/bowel incontinence. Found to have hyponatremia, hypokalemia and hypomagnesemia. CT abdomen/pelvis showed cirrhosis with portal HTN, right pleural effusion with interval development of T10, T8 T12 and L1 compression fractures.  PMH includes liver cirrhosis secondary to alcohol abuse, HTN.  Clinical Impression  Patient presents with dyspnea on exertion, pain, generalized weakness, decreased activity tolerance and impaired mobility s/p above. Pt lives at home with daughter and uses [facility] for ambulation. Does her own ADLs but needs assist with IADLs. Reports hx of falls. Today, pt requires Min A for bed mobility, Min guard assist for standing and taking a f ew steps in room with RW. Will likely need RW at home for support due to back pain, which was limiting distance today. Sp02 dropped to 70% on RA with minimal activity; donned 2L/min 02 [place] and able to rebound quickly. Education re: back precautions, positioning due to fxs. Will follow acutely to maximize independence and mobility prior to return home.    Follow Up Recommendations Home health PT;Supervision for mobility/OOB    Equipment Recommendations  None recommended by PT    Recommendations for Other Services       Precautions / Restrictions Precautions Precautions: Back;Fall;Other (comment) Precaution Booklet Issued: No Precaution Comments: Reviewed precautions for comfort, watch 02 Restrictions Weight Bearing Restrictions: No      Mobility  Bed Mobility Overal bed mobility: Needs Assistance Bed Mobility: Rolling;Sidelying to Sit Rolling: Min guard Sidelying to sit: HOB elevated;Min assist       General bed mobility comments: Cues for sequencing for log roll technique and use of  rail; Min A to roll and elevate trunk.    Transfers Overall transfer level: Needs assistance Equipment used: Rolling walker (2 wheeled) Transfers: Sit to/from Stand Sit to Stand: Min guard         General transfer comment: Min guard for safety. Stood from [employer] with cues for hand placement/technique. Transferred to chair.  Ambulation/Gait Ambulation/Gait assistance: Min guard Gait Distance (Feet): 4 Feet Assistive device: Rolling walker (2 wheeled) Gait Pattern/deviations: Trunk flexed;Step-through pattern;Decreased stride length Gait velocity: decreased   General Gait Details: Able to take a few steps to get to chair with Min guard assist and use of RW. Distance limited due to pain in back. 3/4 DOE. Sp02 dropped to 70% on RA, donned 2L/min 02 [place] and recovered in 1-2 minutes.  Stairs            Wheelchair Mobility    Modified Rankin (Stroke Patients Only)       Balance Overall balance assessment: History of Falls;Needs assistance Sitting-balance support: Feet supported;No upper extremity supported Sitting balance-Leahy Scale: Fair     Standing balance support: During functional activity Standing balance-Leahy Scale: Poor Standing balance comment: Requires UE support in standing.                             Pertinent Vitals/Pain Pain Assessment: Faces Faces Pain Scale: Hurts even more Pain Location: back with movement Pain Descriptors / Indicators: Aching;Grimacing;Guarding;Sore Pain Intervention(s): Monitored during session;Repositioned;Premedicated before session;Limited activity within patient's tolerance    Home Living Family/patient expects to be discharged to:: Private residence Living Arrangements: Children (daughter who is on disability) Available Help at Discharge: Family;Available 24 hours/day  Type of Home: House Home Access: Stairs to enter Entrance Stairs-Rails: Right Entrance Stairs-Number of Steps: 5-6 Home Layout: One  level Home Equipment: Cane - single point;Walker - 2 wheels;Shower seat;Grab bars - tub/shower      Prior Function Level of Independence: Needs assistance   Gait / Transfers Assistance Needed: Uses SPC for ambulation; reports 1 fall vs passing out  ADL's / Homemaking Assistance Needed: Does own ADLs. Daughter does IADLs and cooking/driving.        Hand Dominance        Extremity/Trunk Assessment   Upper Extremity Assessment Upper Extremity Assessment: Defer to OT evaluation    Lower Extremity Assessment Lower Extremity Assessment: Generalized weakness       Communication   Communication: No difficulties  Cognition Arousal/Alertness: Awake/alert Behavior During Therapy: WFL for tasks assessed/performed Overall Cognitive Status: Within Functional Limits for tasks assessed                                 General Comments: for basic mobility tasks      General Comments General comments (skin integrity, edema, etc.): SP02 dropped to 70% on RA, donned 2L/min 02 [place] end of session and rebounded quickly to >90%.    Exercises     Assessment/Plan    PT Assessment Patient needs continued PT services  PT Problem List Decreased strength;Decreased mobility;Pain;Decreased balance;Decreased activity tolerance;Cardiopulmonary status limiting activity;Decreased knowledge of precautions       PT Treatment Interventions Gait training;Patient/family education;Therapeutic activities;Functional mobility training;Stair training;Balance training;Therapeutic exercise    PT Goals (Current goals can be found in the Care Plan section)  Acute Rehab PT Goals Patient Stated Goal: improve pain PT Goal Formulation: With patient Time For Goal Achievement: 10/23/20 Potential to Achieve Goals: Good    Frequency Min 3X/week   Barriers to discharge Inaccessible home environment stairs    Co-evaluation               AM-PAC PT "6 Clicks" Mobility  Outcome Measure Help  needed turning from your back to your side while in a flat bed without using bedrails?: A Little Help needed moving from lying on your back to sitting on the side of a flat bed without using bedrails?: A Little Help needed moving to and from a bed to a chair (including a wheelchair)?: A Little Help needed standing up from a chair using your arms (e.g., wheelchair or bedside chair)?: A Little Help needed to walk in hospital room?: A Little Help needed climbing 3-5 steps with a railing? : A Lot 6 Click Score: 17    End of Session Equipment Utilized During Treatment: Oxygen;Gait belt Activity Tolerance: Treatment limited secondary to medical complications (Comment) (drop in Sp02\) Patient left: in chair;with call bell/phone within reach;with chair alarm set Nurse Communication: Mobility status PT Visit Diagnosis: Pain;Difficulty in walking, not elsewhere classified (R26.2);Muscle weakness (generalized) (M62.81) Pain - part of body:  (back)    Time: 7425-9563 PT Time Calculation (min) (ACUTE ONLY): 15 min   Charges:   PT Evaluation $PT Eval Moderate Complexity: 1 Mod          Marisa Severin, PT, DPT Acute Rehabilitation Services Pager [phone] Office [phone]      Marguarite Arbour A Sabra Heck 10/09/2020, 12:54 PM

## 2020-10-10 ENCOUNTER — Encounter (HOSPITAL_COMMUNITY): Payer: Self-pay | Admitting: Internal Medicine

## 2020-10-10 ENCOUNTER — Inpatient Hospital Stay (HOSPITAL_COMMUNITY): Payer: Medicare Other

## 2020-10-10 DIAGNOSIS — I428 Other cardiomyopathies: Secondary | ICD-10-CM | POA: Diagnosis not present

## 2020-10-10 LAB — CBC
Hemoglobin: 11.6 g/dL — ABNORMAL LOW (ref 12.0–15.0)
Platelets: 160 10*3/uL (ref 150–400)
WBC: 7.2 10*3/uL (ref 4.0–10.5)

## 2020-10-10 LAB — ECHOCARDIOGRAM COMPLETE
Area-P 1/2: 5.66 cm2
Height: 64 in
S' Lateral: 3 cm
Weight: 2871.27 oz

## 2020-10-10 LAB — GLUCOSE, CAPILLARY
Glucose-Capillary: 104 mg/dL — ABNORMAL HIGH (ref 70–99)
Glucose-Capillary: 179 mg/dL — ABNORMAL HIGH (ref 70–99)
Glucose-Capillary: 152 mg/dL — ABNORMAL HIGH (ref 70–99)

## 2020-10-10 LAB — COMPREHENSIVE METABOLIC PANEL
ALT: 16 U/L (ref 0–44)
AST: 18 U/L (ref 15–41)
Albumin: 2.8 g/dL — ABNORMAL LOW (ref 3.5–5.0)
Alkaline Phosphatase: 101 U/L (ref 38–126)
Anion gap: 5 (ref 5–15)
BUN: 7 mg/dL — ABNORMAL LOW (ref 8–23)
CO2: 31 mmol/L (ref 22–32)
Calcium: 8.5 mg/dL — ABNORMAL LOW (ref 8.9–10.3)
Chloride: 89 mmol/L — ABNORMAL LOW (ref 98–111)
Creatinine, Ser: 0.63 mg/dL (ref 0.44–1.00)
GFR, Estimated: >60 mL/min (ref >60)
Glucose, Bld: 106 mg/dL — ABNORMAL HIGH (ref 70–99)
Potassium: 4.3 mmol/L (ref 3.5–5.1)
Sodium: 125 mmol/L — ABNORMAL LOW (ref 135–145)
Total Bilirubin: 0.9 mg/dL (ref 0.3–1.2)
Total Protein: 5.3 g/dL — ABNORMAL LOW (ref 6.5–8.1)

## 2020-10-10 LAB — T4, FREE: Free T4: 1.48 ng/dL — ABNORMAL HIGH (ref 0.61–1.12)

## 2020-10-10 LAB — MAGNESIUM: Magnesium: 1.8 mg/dL (ref 1.7–2.4)

## 2020-10-10 MED ORDER — VITAMIN D 25 MCG (1000 UNIT) PO TABS
1000.0000 [IU] | ORAL_TABLET | Freq: Every day | ORAL | Status: DC
  Administered 2020-10-10 – 2020-10-18 (×9): 1000 [IU] via ORAL
  Filled 2020-10-10 (×9): qty 1

## 2020-10-10 MED ORDER — SODIUM CHLORIDE 0.9 % IV SOLN: INTRAVENOUS | Status: DC

## 2020-10-10 MED ORDER — OXYCODONE HCL 5 MG PO TABS
10.0000 mg | ORAL_TABLET | ORAL | Status: DC | PRN
  Administered 2020-10-10 – 2020-10-18 (×21): 10 mg via ORAL
  Filled 2020-10-10 (×22): qty 2

## 2020-10-10 MED ORDER — CEFAZOLIN SODIUM-DEXTROSE 2-4 GM/100ML-% IV SOLN
2.0000 g | INTRAVENOUS | Status: AC
  Filled 2020-10-10: qty 100

## 2020-10-10 MED ORDER — PANTOPRAZOLE SODIUM 40 MG PO TBEC
40.0000 mg | DELAYED_RELEASE_TABLET | Freq: Every day | ORAL | Status: DC
  Administered 2020-10-10 – 2020-10-17 (×8): 40 mg via ORAL
  Filled 2020-10-10 (×8): qty 1

## 2020-10-10 MED ORDER — LABETALOL HCL 5 MG/ML IV SOLN
5.0000 mg | INTRAVENOUS | Status: DC | PRN
  Administered 2020-10-11 (×3): 5 mg via INTRAVENOUS
  Filled 2020-10-10 (×3): qty 4

## 2020-10-10 MED ORDER — LEVOTHYROXINE SODIUM 100 MCG PO TABS
100.0000 ug | ORAL_TABLET | Freq: Every day | ORAL | Status: DC
  Administered 2020-10-12 – 2020-10-18 (×7): 100 ug via ORAL
  Filled 2020-10-10 (×7): qty 1

## 2020-10-10 MED ORDER — DOCUSATE SODIUM 100 MG PO CAPS
100.0000 mg | ORAL_CAPSULE | Freq: Every day | ORAL | Status: DC
  Administered 2020-10-10 – 2020-10-18 (×9): 100 mg via ORAL
  Filled 2020-10-10 (×9): qty 1

## 2020-10-10 NOTE — Consult Note (Addendum)
Chief Complaint: Patient was seen in consultation today for  Chief Complaint  Patient presents with  . Back Pain  . Abdominal Pain    Referring Physician(s): Dr. Josephine Cables   Supervising Physician: Luanne Bras  Patient Status: [facility] - In-pt  History of Present Illness: [name] is a 70 y.o. female with a medical history significant for HTN, GI bleed, thyroid disease (s/p radiation) and cirrhosis. She presented to the [facility] ED 10/08/20 with abdominal and low back pain. Her labs in the ED were significant for low sodium, potassium and magnesium. CT abdomen/pelvis showed cirrhosis with portal hypertension, right pleural effusion and multiple thoracic and lumbar compression fractures. IR was consulted for possible kyphoplasty and additional imaging was requested for further work up.   MR Thoracic/Lumbar Spine 10/09/20 IMPRESSION: 1. Acute compression fracture involving the T10 vertebral body with up to 60% height loss and 4 mm bony retropulsion. No significant spinal stenosis. 2. Acute compression fracture involving the T9 vertebral body with up to 25% height loss without significant bony retropulsion. 3. Late subacute to chronic severe compression fractures involving the T12 and L1 vertebral bodies with 80-90% height loss and up to 4 mm bony retropulsion. No significant spinal stenosis. 4. Markedly heterogeneous appearance of the visualized bone marrow, nonspecific, but could reflect underlying osteoporosis. 5. Small syrinx measuring up to 3 mm involving the majority of the thoracic spinal cord, terminating at the level of T8-9. 6. Moderate layering right pleural effusion. 7. Hepatic cirrhosis.  Dr. Estanislado Pandy has reviewed these images and has approved the patient for T9 and T10 kyphoplasty/vertebroplasty.   Past Medical History:  Diagnosis Date  . ACROCHORDON 03/16/2010  . GI bleeding 06/2018  . Heart murmur   . Hypertension   . Hypothyroidism   . Liver cirrhosis ([place])    . Multiple duodenal ulcers 07/16/2018   associated with melena, anemia, gastritis. due to NSAID.  hx gastric ulcers and bleeding 2004, 2007  . Thyroid disease    S/p Radiation    Past Surgical History:  Procedure Laterality Date  . BIOPSY  07/16/2018   Procedure: BIOPSY;  Surgeon: Mauri Pole, MD;  Location: [place] ENDOSCOPY;  Service: Endoscopy;;  . BREAST EXCISIONAL BIOPSY Left    benign  . CHOLECYSTECTOMY N/A 12/07/2015   Procedure: LAPAROSCOPIC CHOLECYSTECTOMY WITH INTRAOPERATIVE CHOLANGIOGRAM;  Surgeon: Alphonsa Overall, MD;  Location: [facility] ORS;  Service: General;  Laterality: N/A;  . ERCP N/A 12/06/2015   Procedure: ENDOSCOPIC RETROGRADE CHOLANGIOPANCREATOGRAPHY (ERCP);  Surgeon: Milus Banister, MD;  Location: [facility];  Service: Endoscopy;  Laterality: N/A;  . ESOPHAGOGASTRODUODENOSCOPY (EGD) WITH PROPOFOL N/A 07/16/2018   Procedure: ESOPHAGOGASTRODUODENOSCOPY (EGD) WITH PROPOFOL;  Surgeon: Mauri Pole, MD;  Location: [place] [facility];  Service: Endoscopy;  Laterality: N/A;  . IM NAILING TIBIA Right 07/19/2012   DR Maureen Ralphs  . SHOULDER HEMI-ARTHROPLASTY Right 07/22/2013   Procedure: RIGHT SHOULDER HEMI-ARTHROPLASTY;  Surgeon: Augustin Schooling, MD;  Location: [place];  Service: Orthopedics;  Laterality: Right;  . TIBIA IM NAIL INSERTION Right 07/19/2013   Procedure: INTRAMEDULLARY (IM) NAIL TIBIAL;  Surgeon: Gearlean Alf, MD;  Location: [place];  Service: Orthopedics;  Laterality: Right;  . TUBAL LIGATION      Allergies: Patient has no active allergies.  Medications: Prior to Admission medications   Medication Sig Start Date End Date Taking? Authorizing Provider  Ascorbic Acid (VITAMIN C) 100 MG tablet Take 100 mg by mouth daily.   Yes [provider]  B Complex Vitamins (B COMPLEX  1 PO) Take 1 tablet by mouth daily.   Yes [provider]  Bilberry, Vaccinium myrtillus, (BILBERRY FRUIT PO) Take 1 tablet by mouth daily.   Yes [provider]  calcium  carbonate (OSCAL) 1500 (600 Ca) MG TABS tablet Take 600 mg of elemental calcium by mouth daily with breakfast.   Yes [provider]  citalopram (CELEXA) 20 MG tablet TAKE 1 TABLET BY MOUTH EVERY DAY. Patient taking differently: Take 20 mg by mouth daily. 03/17/14  Yes Patrecia Pour, MD  Flaxseed, Linseed, (FLAX SEED OIL PO) Take 1 tablet by mouth daily.   Yes [provider]  folic acid (FOLVITE) 284 MCG tablet Take 400 mcg by mouth daily.   Yes [provider]  hydrALAZINE (APRESOLINE) 10 MG tablet Take 10 mg by mouth 3 (three) times daily.   Yes [provider]  levothyroxine (SYNTHROID, LEVOTHROID) 137 MCG tablet Take 137 mcg by mouth daily. 05/23/18  Yes [provider]  lisinopril (ZESTRIL) 40 MG tablet Take 40 mg by mouth daily.   Yes [provider]  LUTEIN PO Take 1 tablet by mouth daily.   Yes [provider]  Multiple Vitamin (MULTIVITAMIN WITH MINERALS) TABS tablet Take 1 tablet by mouth daily.   Yes [provider]  Multiple Vitamins-Minerals (MULTIVITAMIN & MINERAL PO) Take 1 tablet by mouth daily.   Yes [provider]  multivitamin-lutein (OCUVITE-LUTEIN) CAPS capsule Take 1 capsule by mouth daily.   Yes [provider]  Omega-3 Fatty Acids (FISH OIL CONCENTRATE) 1000 MG CAPS Take 1,000 mg by mouth daily.   Yes [provider]  VITAMIN E PO Take 1 tablet by mouth daily.   Yes [provider]  amLODipine (NORVASC) 10 MG tablet Take 1 tablet (10 mg total) by mouth daily. Patient not taking: Reported on 10/09/2020 12/09/15   Dana Allan I, MD  ferrous sulfate 325 (65 FE) MG EC tablet Take 1 tablet (325 mg total) by mouth 3 (three) times daily with meals for 30 days. Patient not taking: Reported on 10/09/2020 07/18/18 08/17/18  Damita Lack, MD     Family History  Problem Relation Age of Onset  . Heart disease Mother   . Lung cancer Father   . Heart disease Brother    . Psoriasis Brother   . Heart disease Brother   . Healthy Daughter   . Healthy Daughter     Social History   Socioeconomic History  . Marital status: Widowed    Spouse name: Not on file  . Number of children: Not on file  . Years of education: Not on file  . Highest education level: Not on file  Occupational History  . Not on file  Tobacco Use  . Smoking status: Current Every Day Smoker    Packs/day: 1.00    Years: 40.00    Pack years: 40.00    Types: Cigarettes  . Smokeless tobacco: Never Used  Vaping Use  . Vaping Use: Never used  Substance and Sexual Activity  . Alcohol use: No    Comment: stopped drinking 5 weeks ago/updated 07/09/2018  . Drug use: No  . Sexual activity: Not on file  Other Topics Concern  . Not on file  Social History Narrative  . Not on file   Social Determinants of Health   Financial Resource Strain: Not on file  Food Insecurity: Not on file  Transportation Needs: Not on file  Physical Activity: Not on file  Stress: Not  on file  Social Connections: Not on file    Review of Systems: A 12 point ROS discussed and pertinent positives are indicated in the HPI above.  All other systems are negative.  Review of Systems  Constitutional: Negative for appetite change, fatigue and fever.  Respiratory: Negative for cough and shortness of breath.   Cardiovascular: Negative for chest pain and leg swelling.  Gastrointestinal: Negative for abdominal pain, diarrhea, nausea and vomiting.  Musculoskeletal: Positive for back pain.  Neurological: Negative for dizziness and headaches.    Vital Signs: BP (!) 154/83 (BP Location: Left Arm)   Pulse 93   Temp 98.4 F (36.9 C) (Oral)   Resp 18   Ht 5\' 4"  (1.626 m)   Wt 179 lb 7.3 oz (81.4 kg)   SpO2 99%   BMI 30.80 kg/m   Physical Exam Constitutional:      General: She is not in acute distress.    Appearance: She is not ill-appearing.  HENT:     Mouth/Throat:     Mouth: Mucous membranes are  moist.     Pharynx: Oropharynx is clear.  Cardiovascular:     Rate and Rhythm: Normal rate and regular rhythm.     Pulses: Normal pulses.     Heart sounds: Normal heart sounds.  Pulmonary:     Comments: Diminished breath sounds, some rhonchi.  Abdominal:     General: Bowel sounds are normal.     Palpations: Abdomen is soft.     Tenderness: There is no abdominal tenderness.  Skin:    General: Skin is warm and dry.  Neurological:     Mental Status: She is alert and oriented to person, place, and time.     Imaging: MR THORACIC SPINE WO CONTRAST  Result Date: 10/09/2020 CLINICAL DATA:  Initial evaluation for acute spine fracture. EXAM: MRI THORACIC AND LUMBAR SPINE WITHOUT CONTRAST TECHNIQUE: Multiplanar and multiecho pulse sequences of the thoracic and lumbar spine were obtained without intravenous contrast. COMPARISON:  Prior CT from 10/08/2020. FINDINGS: MRI THORACIC SPINE FINDINGS Alignment: Trace sigmoid scoliosis. Alignment otherwise normal with preservation of the normal thoracic kyphosis. No listhesis. Vertebrae: Acute biconcave compression fracture seen involving the T9 vertebral body. Associated mild 25% central height loss with trace 3 mm bony retropulsion. Additional acute appearing compression fracture involving the T10 vertebral body with up to 60% height loss and 4 mm bony retropulsion. Associated marrow edema about these fractures extend to involve the posterior elements. Severe compression deformities involving the T12 and L1 vertebral bodies with up to 80-90% height loss and 4 mm bony retropulsion are late subacute to chronic in appearance with only trace residual marrow edema. Additional mild chronic compression fracture involving the T6 vertebral body with up to 30% height loss without bony retropulsion. Vertebral body height otherwise maintained with no other acute or chronic fracture. Underlying bone marrow signal intensity is markedly heterogeneous, nonspecific, but could  reflect underlying osteoporosis. No discrete or worrisome osseous lesions. No other abnormal marrow edema. Cord: Small syrinx involving the thoracic spinal cord measures up to 3 mm in maximal diameter, extending from the visualized upper cord to approximately the T8-9 level. Signal intensity within the cord is otherwise normal. Underlying normal cord caliber and morphology. Paraspinal and other soft tissues: Mild paraspinous edema adjacent to the acute T9 and T10 compression fractures. Paraspinous soft tissues demonstrate no other acute finding. Moderate layering right pleural effusion. Visualized visceral structures otherwise unremarkable. Disc levels: Normal expected for age multilevel disc desiccation seen  throughout the thoracic spine. No significant disc bulge or focal disc herniation. Three-4 mm bony retropulsion at the levels of T10, T12, and L1 related to the underlying compression fractures without significant spinal stenosis. No other significant spinal stenosis within the thoracic spine. Foramina remain patent. MRI LUMBAR SPINE FINDINGS Segmentation: Standard. Lowest well-formed disc space labeled the L5-S1 level. Alignment: Mild exaggeration of the normal lumbar lordosis. No listhesis. Vertebrae: Late subacute to chronic compression fractures involving the T12 and L1 vertebral bodies again noted. Vertebral body height otherwise maintained with no other acute, subacute, or chronic compression fracture. Underlying bone marrow signal intensity is markedly heterogeneous without discrete or worrisome osseous lesion. No other abnormal marrow edema. Conus medullaris and cauda equina: Conus extends to the T12-L1 level. Conus and cauda equina appear normal. Paraspinal and other soft tissues: Chronic fatty atrophy noted throughout the posterior paraspinous musculature. Paraspinous soft tissues demonstrate no acute finding. 1.8 cm simple cyst noted within the interpolar left kidney. Additional 9 mm T1 hyperintense  lesion at the inferior pole the left kidney noted, nonspecific, but likely a small proteinaceous and/or hemorrhagic cyst. Hepatic cirrhosis partially visualized. Disc levels: L1-2: Minimal annular disc bulge. Mild facet hypertrophy. No spinal stenosis. Foramina remain patent. L2-3: Disc desiccation with minimal annular disc bulge. Mild facet hypertrophy. No stenosis. L3-4: Minimal annular disc bulge. Mild facet hypertrophy. No canal or foraminal stenosis. L4-5: Disc desiccation with minimal annular disc bulge. Mild facet hypertrophy. No stenosis. L5-S1: Negative interspace. Mild facet hypertrophy. No canal or foraminal stenosis. IMPRESSION: 1. Acute compression fracture involving the T10 vertebral body with up to 60% height loss and 4 mm bony retropulsion. No significant spinal stenosis. 2. Acute compression fracture involving the T9 vertebral body with up to 25% height loss without significant bony retropulsion. 3. Late subacute to chronic severe compression fractures involving the T12 and L1 vertebral bodies with 80-90% height loss and up to 4 mm bony retropulsion. No significant spinal stenosis. 4. Markedly heterogeneous appearance of the visualized bone marrow, nonspecific, but could reflect underlying osteoporosis. 5. Small syrinx measuring up to 3 mm involving the majority of the thoracic spinal cord, terminating at the level of T8-9. 6. Moderate layering right pleural effusion. 7. Hepatic cirrhosis. Electronically Signed   By: Jeannine Boga M.D.   On: 10/09/2020 20:23   MR LUMBAR SPINE WO CONTRAST  Result Date: 10/09/2020 CLINICAL DATA:  Initial evaluation for acute spine fracture. EXAM: MRI THORACIC AND LUMBAR SPINE WITHOUT CONTRAST TECHNIQUE: Multiplanar and multiecho pulse sequences of the thoracic and lumbar spine were obtained without intravenous contrast. COMPARISON:  Prior CT from 10/08/2020. FINDINGS: MRI THORACIC SPINE FINDINGS Alignment: Trace sigmoid scoliosis. Alignment otherwise normal  with preservation of the normal thoracic kyphosis. No listhesis. Vertebrae: Acute biconcave compression fracture seen involving the T9 vertebral body. Associated mild 25% central height loss with trace 3 mm bony retropulsion. Additional acute appearing compression fracture involving the T10 vertebral body with up to 60% height loss and 4 mm bony retropulsion. Associated marrow edema about these fractures extend to involve the posterior elements. Severe compression deformities involving the T12 and L1 vertebral bodies with up to 80-90% height loss and 4 mm bony retropulsion are late subacute to chronic in appearance with only trace residual marrow edema. Additional mild chronic compression fracture involving the T6 vertebral body with up to 30% height loss without bony retropulsion. Vertebral body height otherwise maintained with no other acute or chronic fracture. Underlying bone marrow signal intensity is markedly heterogeneous, nonspecific, but could  reflect underlying osteoporosis. No discrete or worrisome osseous lesions. No other abnormal marrow edema. Cord: Small syrinx involving the thoracic spinal cord measures up to 3 mm in maximal diameter, extending from the visualized upper cord to approximately the T8-9 level. Signal intensity within the cord is otherwise normal. Underlying normal cord caliber and morphology. Paraspinal and other soft tissues: Mild paraspinous edema adjacent to the acute T9 and T10 compression fractures. Paraspinous soft tissues demonstrate no other acute finding. Moderate layering right pleural effusion. Visualized visceral structures otherwise unremarkable. Disc levels: Normal expected for age multilevel disc desiccation seen throughout the thoracic spine. No significant disc bulge or focal disc herniation. Three-4 mm bony retropulsion at the levels of T10, T12, and L1 related to the underlying compression fractures without significant spinal stenosis. No other significant spinal  stenosis within the thoracic spine. Foramina remain patent. MRI LUMBAR SPINE FINDINGS Segmentation: Standard. Lowest well-formed disc space labeled the L5-S1 level. Alignment: Mild exaggeration of the normal lumbar lordosis. No listhesis. Vertebrae: Late subacute to chronic compression fractures involving the T12 and L1 vertebral bodies again noted. Vertebral body height otherwise maintained with no other acute, subacute, or chronic compression fracture. Underlying bone marrow signal intensity is markedly heterogeneous without discrete or worrisome osseous lesion. No other abnormal marrow edema. Conus medullaris and cauda equina: Conus extends to the T12-L1 level. Conus and cauda equina appear normal. Paraspinal and other soft tissues: Chronic fatty atrophy noted throughout the posterior paraspinous musculature. Paraspinous soft tissues demonstrate no acute finding. 1.8 cm simple cyst noted within the interpolar left kidney. Additional 9 mm T1 hyperintense lesion at the inferior pole the left kidney noted, nonspecific, but likely a small proteinaceous and/or hemorrhagic cyst. Hepatic cirrhosis partially visualized. Disc levels: L1-2: Minimal annular disc bulge. Mild facet hypertrophy. No spinal stenosis. Foramina remain patent. L2-3: Disc desiccation with minimal annular disc bulge. Mild facet hypertrophy. No stenosis. L3-4: Minimal annular disc bulge. Mild facet hypertrophy. No canal or foraminal stenosis. L4-5: Disc desiccation with minimal annular disc bulge. Mild facet hypertrophy. No stenosis. L5-S1: Negative interspace. Mild facet hypertrophy. No canal or foraminal stenosis. IMPRESSION: 1. Acute compression fracture involving the T10 vertebral body with up to 60% height loss and 4 mm bony retropulsion. No significant spinal stenosis. 2. Acute compression fracture involving the T9 vertebral body with up to 25% height loss without significant bony retropulsion. 3. Late subacute to chronic severe compression  fractures involving the T12 and L1 vertebral bodies with 80-90% height loss and up to 4 mm bony retropulsion. No significant spinal stenosis. 4. Markedly heterogeneous appearance of the visualized bone marrow, nonspecific, but could reflect underlying osteoporosis. 5. Small syrinx measuring up to 3 mm involving the majority of the thoracic spinal cord, terminating at the level of T8-9. 6. Moderate layering right pleural effusion. 7. Hepatic cirrhosis. Electronically Signed   By: Jeannine Boga M.D.   On: 10/09/2020 20:23   CT Abdomen Pelvis W Contrast  Result Date: 10/08/2020 CLINICAL DATA:  Nonlocalized acute abdominal pain. Left-sided abdominal pain for 2 months. EXAM: CT ABDOMEN AND PELVIS WITH CONTRAST TECHNIQUE: Multidetector CT imaging of the abdomen and pelvis was performed using the standard protocol following bolus administration of intravenous contrast. CONTRAST:  140mL OMNIPAQUE IOHEXOL 300 MG/ML  SOLN COMPARISON:  CT abdomen pelvis 12/05/2015 FINDINGS: Lower chest: Trace right pleural effusion with associated right lower lobe passive atelectasis. Mitral annular calcifications. Possible tiny hiatal hernia. Hepatobiliary: Nodular hepatic contour. The hepatic parenchyma is diffusely hypodense compared to the splenic parenchyma consistent  with fatty infiltration. No focal liver abnormality. Status post cholecystectomy. No biliary dilatation. Pancreas: No focal lesion. Normal pancreatic contour. No surrounding inflammatory changes. No main pancreatic ductal dilatation. Spleen: Normal in size without focal abnormality. Adrenals/Urinary Tract: No adrenal nodule bilaterally. Bilateral kidneys enhance symmetrically. 1.7 cm fluid density lesion within the left kidney likely represents a simple renal cyst. No hydronephrosis. No hydroureter. The urinary bladder is unremarkable. On delayed imaging, there is no urothelial wall thickening and there are no filling defects in the opacified portions of the  bilateral collecting systems or ureters. Stomach/Bowel: Stomach is within normal limits. No evidence of bowel wall thickening or dilatation. Appendix appears normal. Vascular/Lymphatic: Recanalized paraumbilical vein. Venous collateral are noted. Slight left ovarian venous congestion. The main portal, splenic, superior mesenteric veins are patent. No abdominal aorta or iliac aneurysm. Moderate to severe atherosclerotic plaque of the aorta and its branches. No abdominal, pelvic, or inguinal lymphadenopathy. Reproductive: Uterus and bilateral adnexa are unremarkable. Other: No intraperitoneal free fluid. No intraperitoneal free gas. No organized fluid collection. Musculoskeletal: No abdominal wall hernia or abnormality. Diffusely decreased bone density. No suspicious lytic or blastic osseous lesions. No acute displaced fracture. Multilevel degenerative changes of the spine with interval development of compression fractures of the T10, T12, L1 with greater than 90% height loss. IMPRESSION: 1. Cirrhosis with portal hypertension. No definite focal hepatic lesion; however limited evaluation on this single phase contrast study. Recommend MRI liver protocol for further evaluation. 2. Trace volume right pleural effusion. 3. Possible tiny hiatal hernia. 4. Interval development of T10, T12, L1 compression fractures with greater than 90% height loss. Correlate with point tenderness to palpation to evaluate for acute component. 5. Aortic Atherosclerosis (ICD10-I70.0) including mitral annular calcifications. Electronically Signed   By: Iven Finn M.D.   On: 10/08/2020 19:58   US Abdomen Limited RUQ (LIVER/GB)  Result Date: 10/09/2020 CLINICAL DATA:  Transaminitis EXAM: ULTRASOUND ABDOMEN LIMITED RIGHT UPPER QUADRANT COMPARISON:  CT abdomen pelvis 10/08/2020 FINDINGS: Gallbladder: No gallstones or wall thickening visualized. No sonographic Murphy sign noted by sonographer. Common bile duct: Diameter: 8 mm Liver: Nodular  hepatic contour suspicious for cirrhosis. No focal hepatic lesion. Portal vein is patent on color Doppler imaging with normal direction of blood flow towards the liver. Other: None. IMPRESSION: Cirrhotic liver morphology without focal hepatic lesion. Electronically Signed   By: Miachel Roux M.D.   On: 10/09/2020 09:06    Labs:  CBC: Recent Labs    10/08/20 1641 10/09/20 0020 10/10/20 0346  WBC 8.7 8.9 7.2  HGB 14.0 13.0 11.6*  HCT RESULTS UNAVAILABLE DUE TO INTERFERING SUBSTANCE RESULTS UNAVAILABLE DUE TO INTERFERING SUBSTANCE RESULTS UNAVAILABLE DUE TO INTERFERING SUBSTANCE  PLT 213 209 160    COAGS: Recent Labs    10/09/20 0020  INR 1.0  APTT 34    BMP: Recent Labs    10/08/20 1454 10/09/20 0020 10/09/20 0807 10/10/20 0346  NA 124* 124* 128* 125*  K 2.5* 3.6 3.2* 4.3  CL 81* 85* 86* 89*  CO2 30 29 33* 31  GLUCOSE 109* 106* 89 106*  BUN <5* <5* <5* 7*  CALCIUM 8.7* 8.2* 8.0* 8.5*  CREATININE 0.47 0.51 0.53 0.63  GFRNONAA >60 >60 >60 >60    LIVER FUNCTION TESTS: Recent Labs    10/08/20 1454 10/09/20 0020 10/10/20 0346  BILITOT 1.0 1.2 0.9  AST 26 25 18   ALT 15 15 16   ALKPHOS 130* 127* 101  PROT 6.0* 5.9* 5.3*  ALBUMIN 3.3* 3.2* 2.8*  TUMOR MARKERS: No results for input(s): AFPTM, CEA, CA199, CHROMGRNA in the last 8760 hours.  Assessment and Plan:  Spinal Compression Fractures: [name], 70 year old female, is tentatively scheduled for a T9 and T10 kyphoplasty/vertebroplasty 10/11/20. The patient may be a candidate for a T12 kyphoplasty/vertebroplasty at a later date. L1 compression fracture is not considered a procedural candidate at this time. The patient was seen at the bedside to discuss the procedure and obtain consent. Her daughter [name] was also contacted at the patient's request and the procedure was discussed with her over the phone.   Risks and benefits of T9/T10 kyphoplasty/vertebroplasty were discussed with the patient including,  but not limited to education regarding the natural healing process of compression fractures without intervention, bleeding, infection, cement migration which may cause spinal cord damage, paralysis, pulmonary embolism or even death. This interventional procedure involves the use of X-rays and because of the nature of the planned procedure, it is possible that we will have prolonged use of X-ray fluoroscopy. Potential radiation risks to you include (but are not limited to) the following: - A slightly elevated risk for cancer  several years later in life. This risk is typically less than 0.5% percent. This risk is low in comparison to the normal incidence of human cancer, which is 33% for women and 50% for men according to the [employer]. - Radiation induced injury can include skin redness, resembling a rash, tissue breakdown / ulcers and hair loss (which can be temporary or permanent).  The likelihood of either of these occurring depends on the difficulty of the procedure and whether you are sensitive to radiation due to previous procedures, disease, or genetic conditions.  IF your procedure requires a prolonged use of radiation, you will be notified and given written instructions for further action.  It is your responsibility to monitor the irradiated area for the 2 weeks following the procedure and to notify your physician if you are concerned that you have suffered a radiation induced injury.    All of the patient's questions were answered, patient is agreeable to proceed. Patient will be NPO after midnight. AM labs have been ordered.   Consent signed and in the IR control room.   Thank you for this interesting consult.  I greatly enjoyed meeting DANIYA ARAMBURO and look forward to participating in their care.  A copy of this report was sent to the requesting provider on this date.  Electronically Signed: Soyla Dryer, AGACNP-BC [phone] 10/10/2020, 11:03 AM   I spent a  total of 40 Minutes    in face to face in clinical consultation, greater than 50% of which was counseling/coordinating care for T9 and T10 kyphoplasty/vertebroplasty

## 2020-10-10 NOTE — Plan of Care (Signed)

## 2020-10-10 NOTE — Evaluation (Signed)
Occupational Therapy Evaluation Patient Details Name: [name] MRN: [id number] DOB: [birth year] Today's Date: 10/10/2020    History of Present Illness Patient is a 70 y/o female who presents on 10/09/20 with low back/abdominal pain and new urinary/bowel incontinence. Found to have hyponatremia, hypokalemia and hypomagnesemia. CT abdomen/pelvis showed cirrhosis with portal HTN, right pleural effusion with interval development of T10, T8 T12 and L1 compression fractures.  PMH includes liver cirrhosis secondary to alcohol abuse, HTN.   Clinical Impression   Pt presents with decline in function and safety with ADLs and ADL mobility with impaired strength, balance and endurance. Pt limited by back pain. Pt required encouragement to participate with OT for EOB and refused any OOB activity. PTA pt was Ind with ADLs, used cane for mobility and her daughter assists with hmoe mgt/IADLs. Pt currently requires min guard A to sit EOB and max A with LB ADLs. Pt would benefit from acute OT services to address impairments to maximize level of function and safety    Follow Up Recommendations  Home health OT;Supervision - Intermittent    Equipment Recommendations  3 in 1 bedside commode;Other (comment) (ADL A/E kit)    Recommendations for Other Services       Precautions / Restrictions Precautions Precautions: Back;Fall Precaution Booklet Issued: No Precaution Comments: reviewed back precautions Restrictions Weight Bearing Restrictions: No      Mobility Bed Mobility Overal bed mobility: Needs Assistance Bed Mobility: Rolling;Sidelying to Sit;Sit to Sidelying Rolling: Min guard Sidelying to sit: HOB elevated;Min assist     Sit to sidelying: Min assist General bed mobility comments: cues for log roll technique and to use rail, min A to elevate trunk    Transfers Overall transfer level: Needs assistance               General transfer comment: pt refused to stand from bed or SPT  to [facility]. Pt was min min guard A per last PT note for transfers    Balance Overall balance assessment: History of Falls;Needs assistance Sitting-balance support: Feet supported;No upper extremity supported Sitting balance-Leahy Scale: Fair         Standing balance comment: refused to stand                           ADL either performed or assessed with clinical judgement   ADL Overall ADL's : Needs assistance/impaired Eating/Feeding: Supervision/ safety;Independent;Sitting   Grooming: Wash/dry hands;Wash/dry face;Min guard;Sitting Grooming Details (indicate cue type and reason): sitting EOB Upper Body Bathing: Min guard;Sitting   Lower Body Bathing: Maximal assistance   Upper Body Dressing : Min guard;Sitting   Lower Body Dressing: Maximal assistance Lower Body Dressing Details (indicate cue type and reason): doffed and don socks seated EOB by crossing legs   Toilet Transfer Details (indicate cue type and reason): refused Toileting- Clothing Manipulation and Hygiene: Total assistance;Bed level         General ADL Comments: pt required encouragement to participate with OT for EOB and refused any OOB activity     Vision Baseline Vision/History: Wears glasses Wears Glasses: At all times Patient Visual Report: No change from baseline       Perception     Praxis      Pertinent Vitals/Pain Pain Assessment: Faces Faces Pain Scale: Hurts little more Pain Location: back with movement Pain Descriptors / Indicators: Aching;Grimacing;Guarding;Sore Pain Intervention(s): Monitored during session;Limited activity within patient's tolerance;Premedicated before session;Repositioned     Hand Dominance Right  Extremity/Trunk Assessment Upper Extremity Assessment Upper Extremity Assessment: Generalized weakness   Lower Extremity Assessment Lower Extremity Assessment: Defer to PT evaluation       Communication Communication Communication: No difficulties    Cognition Arousal/Alertness: Awake/alert Behavior During Therapy: WFL for tasks assessed/performed Overall Cognitive Status: Within Functional Limits for tasks assessed                                     General Comments       Exercises     Shoulder Instructions      Home Living Family/patient expects to be discharged to:: Private residence Living Arrangements: Children Available Help at Discharge: Family;Available 24 hours/day Type of Home: House Home Access: Stairs to enter [employer] of Steps: 5-6 Entrance Stairs-Rails: Right Home Layout: One level     Bathroom Shower/Tub: Tub/shower unit         Home Equipment: Cane - single point;Walker - 2 wheels;Shower seat;Grab bars - tub/shower          Prior Functioning/Environment Level of Independence: Needs assistance  Gait / Transfers Assistance Needed: Uses SPC for ambulation; reports 1 fall vs passing out ADL's / Homemaking Assistance Needed: Ind with selfcare/ADLs. Daughter does IADLs and cooking/driving.            OT Problem List: Impaired balance (sitting and/or standing);Decreased strength;Pain;Decreased activity tolerance;Decreased knowledge of use of DME or AE      OT Treatment/Interventions: Self-care/ADL training;DME and/or AE instruction;Therapeutic activities;Balance training;Therapeutic exercise;Patient/family education    OT Goals(Current goals can be found in the care plan section) Acute Rehab OT Goals Patient Stated Goal: less pain OT Goal Formulation: With patient Time For Goal Achievement: 10/24/20 Potential to Achieve Goals: Good ADL Goals Pt Will Perform Grooming: with min guard assist;with supervision;with set-up;standing;with caregiver independent in assisting Pt Will Perform Upper Body Bathing: with supervision;with set-up;sitting;with caregiver independent in assisting Pt Will Perform Lower Body Bathing: with min assist;with adaptive equipment;with caregiver  independent in assisting Pt Will Perform Upper Body Dressing: with supervision;with set-up;with caregiver independent in assisting;sitting Pt Will Perform Lower Body Dressing: with min assist;with adaptive equipment;with caregiver independent in assisting Pt Will Transfer to Toilet: with min guard assist;ambulating Pt Will Perform Toileting - Clothing Manipulation and hygiene: with mod assist;with min assist;sit to/from stand;with caregiver independent in assisting Pt Will Perform Tub/Shower Transfer: with min guard assist;with supervision;ambulating;shower seat;3 in 1;grab bars;rolling walker  OT Frequency: Min 2X/week   Barriers to D/C:            Co-evaluation              AM-PAC OT "6 Clicks" Daily Activity     Outcome Measure Help from another person eating meals?: None Help from another person taking care of personal grooming?: A Little Help from another person toileting, which includes using toliet, bedpan, or urinal?: A Lot Help from another person bathing (including washing, rinsing, drying)?: A Lot Help from another person to put on and taking off regular upper body clothing?: A Little Help from another person to put on and taking off regular lower body clothing?: A Lot 6 Click Score: 16   End of Session    Activity Tolerance: Patient limited by pain;Patient limited by fatigue Patient left: in bed  OT Visit Diagnosis: Other abnormalities of gait and mobility (R26.89);Pain Pain - part of body:  (back)  Time: 4301-4840 OT Time Calculation (min): 18 min Charges:  OT General Charges $OT Visit: 1 Visit OT Evaluation $OT Eval Moderate Complexity: 1 Mod   Britt Bottom 10/10/2020, 4:18 PM

## 2020-10-10 NOTE — Progress Notes (Signed)
PROGRESS NOTE    LAKITA SAHLIN  ZOX:[id number] DOB: Oct 10, 1950 DOA: 10/08/2020 PCP: [name], MD   Brief Narrative:  70 year old with history of HTN, hypothyroidism, GERD, liver cirrhosis presents with abdominal and low back pain.  Upon admission patient had hyponatremia, hypokalemia, hypomagnesemia CT abdomen pelvis showed cirrhosis with portal hypertension, right pleural effusion with interval development of T10, T8 T12 and L1 compression fracture.  MRI confirmed these findings.  IR consulted who recommended kyphoplasty.   Assessment & Plan:   Principal Problem:   Back pain Active Problems:   HYPERTENSION, BENIGN ESSENTIAL   Hyponatremia   Hypothyroidism   Hypokalemia due to excessive gastrointestinal loss of potassium   Abdominal pain   Hypomagnesemia   Compression fracture of body of thoracic vertebra (HCC)   History of cirrhosis of liver    Back pain due Thoracic compression fracture -Significant amount of pain.  CT abdomen pelvis shows compression fracture T10, T12 and L1 -Pain control, bowel regimen, PT/OT.  Still having severe pain therefore increase oxycodone IR 10 mg, IV morphine as needed -IR consulted, MRI performed.  Plans for kyphoplasty tomorrow  Abdominal pain, improved Liver cirrhosis with history of alcohol use History of cholecystectomy -Continue PPI.  Pain control -LFTs relatively normal, lipase normal -Right upper quadrant ultrasound-showed cirrhotic pathology -Acute hepatitis-negative -Echo - Outpatient GI follow-up -Endoscopy 06/2018- showed gastritis and multiple duodenal ulcer secondary to NSAID - Spoke with [facility], will arrange for outpatient follow-up  Vitamin D deficiency - Supplement started  Daily tobacco use with history of COPD, mild exacerbation -Diffuse diminished breath sounds.  Will order bronchodilators and steroids.  Hyponatremia, hypovolemia -Urine sodium less than 10.  Will provide gentle hydration and  closely monitor sodium levels  Hypokalemia, resolved -Replete as needed  Hypomagnesemia -Replete as needed  Hypothyroidism -Low TSH elevated T4.  Will reduce to Synthroid 100 mcg daily  Essential hypertension -Continue hydralazine and lisinopril  Hyperlipidemia -Continue Lovaza   DVT prophylaxis: SCDs Start: 10/08/20 2128 Code Status: Full code Family Communication:    Status is: Inpatient  Remains inpatient appropriate because:Inpatient level of care appropriate due to severity of illness.  Ongoing evaluation for her thoracic and lumbar fractures causing significant ambulatory dysfunction and back pain.  Evaluation for kyphoplasty ongoing.   Dispo: The patient is from: Home              Anticipated d/c is to: Home              Patient currently is not medically stable to d/c.   Difficult to place patient No   Subjective: Still complaining of lot of mid back pain especially with mobility.  Requesting to adjust her pain medications appropriately  Review of Systems Otherwise negative except as per HPI, including: General: Denies fever, chills, night sweats or unintended weight loss. Resp: Denies cough, wheezing, shortness of breath. Cardiac: Denies chest pain, palpitations, orthopnea, paroxysmal nocturnal dyspnea. GI: Denies abdominal pain, nausea, vomiting, diarrhea or constipation GU: Denies dysuria, frequency, hesitancy or incontinence MS: Denies muscle aches, joint pain or swelling Neuro: Denies headache, neurologic deficits (focal weakness, numbness, tingling), abnormal gait Psych: Denies anxiety, depression, SI/HI/AVH Skin: Denies new rashes or lesions ID: Denies sick contacts, exotic exposures, travel  Examination: Constitutional: Not in acute distress Respiratory: Clear to auscultation bilaterally Cardiovascular: Normal sinus rhythm, no rubs Abdomen: Nontender nondistended good bowel sounds Musculoskeletal: No edema noted Skin: No rashes  seen Neurologic: CN 2-12 grossly intact.  And nonfocal Psychiatric: Normal judgment and insight.  Alert and oriented x 3. Normal mood.    Objective: Vitals:   10/09/20 2037 10/09/20 2355 10/10/20 0354 10/10/20 0729  BP: (!) 158/88 (!) 169/97 (!) 159/71 (!) 174/92  Pulse: 91 87 89 85  Resp:   16 19  Temp: 98.3 F (36.8 C) 98.1 F (36.7 C) 97.6 F (36.4 C) 97.8 F (36.6 C)  TempSrc: Oral Oral Oral Oral  SpO2: 99%  100% 100%  Weight:      Height:        Intake/Output Summary (Last 24 hours) at 10/10/2020 0734 Last data filed at 10/09/2020 2000 Gross per 24 hour  Intake 246.4 ml  Output 450 ml  Net -203.6 ml   Filed Weights   10/09/20 0005  Weight: 81.4 kg     Data Reviewed:   CBC: Recent Labs  Lab 10/08/20 1641 10/09/20 0020 10/10/20 0346  WBC 8.7 8.9 7.2  NEUTROABS 7.1  --   --   HGB 14.0 13.0 11.6*  HCT RESULTS UNAVAILABLE DUE TO INTERFERING SUBSTANCE RESULTS UNAVAILABLE DUE TO INTERFERING SUBSTANCE RESULTS UNAVAILABLE DUE TO INTERFERING SUBSTANCE  MCV RESULTS UNAVAILABLE DUE TO INTERFERING SUBSTANCE RESULTS UNAVAILABLE DUE TO INTERFERING SUBSTANCE RESULTS UNAVAILABLE DUE TO INTERFERING SUBSTANCE  PLT 213 209 001   Basic Metabolic Panel: Recent Labs  Lab 10/08/20 1454 10/08/20 1641 10/09/20 0020 10/09/20 0807 10/10/20 0346  NA 124*  --  124* 128* 125*  K 2.5*  --  3.6 3.2* 4.3  CL 81*  --  85* 86* 89*  CO2 30  --  29 33* 31  GLUCOSE 109*  --  106* 89 106*  BUN <5*  --  <5* <5* 7*  CREATININE 0.47  --  0.51 0.53 0.63  CALCIUM 8.7*  --  8.2* 8.0* 8.5*  MG  --  1.1* 2.6*  --  1.8  PHOS  --   --  3.4  --   --    GFR: Estimated Creatinine Clearance: 66.6 mL/min (by C-G formula based on SCr of 0.63 mg/dL). Liver Function Tests: Recent Labs  Lab 10/08/20 1454 10/09/20 0020 10/10/20 0346  AST 26 25 18   ALT 15 15 16   ALKPHOS 130* 127* 101  BILITOT 1.0 1.2 0.9  PROT 6.0* 5.9* 5.3*  ALBUMIN 3.3* 3.2* 2.8*   Recent Labs  Lab 10/08/20 1454   LIPASE 29   No results for input(s): AMMONIA in the last 168 hours. Coagulation Profile: Recent Labs  Lab 10/09/20 0020  INR 1.0   Cardiac Enzymes: No results for input(s): CKTOTAL, CKMB, CKMBINDEX, TROPONINI in the last 168 hours. BNP (last 3 results) No results for input(s): PROBNP in the last 8760 hours. HbA1C: No results for input(s): HGBA1C in the last 72 hours. CBG: Recent Labs  Lab 10/09/20 2350 10/10/20 0503  GLUCAP 149* 104*   Lipid Profile: No results for input(s): CHOL, HDL, LDLCALC, TRIG, CHOLHDL, LDLDIRECT in the last 72 hours. Thyroid Function Tests: Recent Labs    10/09/20 0736  TSH 0.228*   Anemia Panel: No results for input(s): VITAMINB12, FOLATE, FERRITIN, TIBC, IRON, RETICCTPCT in the last 72 hours. Sepsis Labs: Recent Labs  Lab 10/08/20 1642 10/08/20 2258  LATICACIDVEN 1.8 1.2    Recent Results (from the past 240 hour(s))  Resp Panel by RT-PCR (Flu A&B, Covid) Nasopharyngeal Swab     Status: None   Collection Time: 10/08/20  5:33 PM   Specimen: Nasopharyngeal Swab; Nasopharyngeal(NP) swabs in vial transport medium  Result Value Ref Range Status   SARS  Coronavirus 2 by RT PCR NEGATIVE NEGATIVE Final    Comment: (NOTE) SARS-CoV-2 target nucleic acids are NOT DETECTED.  The SARS-CoV-2 RNA is generally detectable in upper respiratory specimens during the acute phase of infection. The lowest concentration of SARS-CoV-2 viral copies this assay can detect is 138 copies/mL. A negative result does not preclude SARS-Cov-2 infection and should not be used as the sole basis for treatment or other patient management decisions. A negative result may occur with  improper specimen collection/handling, submission of specimen other than nasopharyngeal swab, presence of viral mutation(s) within the areas targeted by this assay, and inadequate number of viral copies(<138 copies/mL). A negative result must be combined with clinical observations, patient  history, and epidemiological information. The expected result is Negative.  Fact Sheet for Patients:  EntrepreneurPulse.com.au  Fact Sheet for Healthcare Providers:  IncredibleEmployment.be  This test is no t yet approved or cleared by the [place] FDA and  has been authorized for detection and/or diagnosis of SARS-CoV-2 by FDA under an Emergency Use Authorization (EUA). This EUA will remain  in effect (meaning this test can be used) for the duration of the COVID-19 declaration under Section 564(b)(1) of the Act, 21 U.S.C.section 360bbb-3(b)(1), unless the authorization is terminated  or revoked sooner.       Influenza A by PCR NEGATIVE NEGATIVE Final   Influenza B by PCR NEGATIVE NEGATIVE Final    Comment: (NOTE) The Xpert Xpress SARS-CoV-2/FLU/RSV plus assay is intended as an aid in the diagnosis of influenza from Nasopharyngeal swab specimens and should not be used as a sole basis for treatment. Nasal washings and aspirates are unacceptable for Xpert Xpress SARS-CoV-2/FLU/RSV testing.  Fact Sheet for Patients: EntrepreneurPulse.com.au  Fact Sheet for Healthcare Providers: IncredibleEmployment.be  This test is not yet approved or cleared by the [place] FDA and has been authorized for detection and/or diagnosis of SARS-CoV-2 by FDA under an Emergency Use Authorization (EUA). This EUA will remain in effect (meaning this test can be used) for the duration of the COVID-19 declaration under Section 564(b)(1) of the Act, 21 U.S.C. section 360bbb-3(b)(1), unless the authorization is terminated or revoked.  Performed at [facility], [place] [address] [place], [place] [address]          Radiology Studies: MR THORACIC SPINE WO CONTRAST  Result Date: 10/09/2020 CLINICAL DATA:  Initial evaluation for acute spine fracture. EXAM: MRI THORACIC AND LUMBAR SPINE WITHOUT CONTRAST TECHNIQUE:  Multiplanar and multiecho pulse sequences of the thoracic and lumbar spine were obtained without intravenous contrast. COMPARISON:  Prior CT from 10/08/2020. FINDINGS: MRI THORACIC SPINE FINDINGS Alignment: Trace sigmoid scoliosis. Alignment otherwise normal with preservation of the normal thoracic kyphosis. No listhesis. Vertebrae: Acute biconcave compression fracture seen involving the T9 vertebral body. Associated mild 25% central height loss with trace 3 mm bony retropulsion. Additional acute appearing compression fracture involving the T10 vertebral body with up to 60% height loss and 4 mm bony retropulsion. Associated marrow edema about these fractures extend to involve the posterior elements. Severe compression deformities involving the T12 and L1 vertebral bodies with up to 80-90% height loss and 4 mm bony retropulsion are late subacute to chronic in appearance with only trace residual marrow edema. Additional mild chronic compression fracture involving the T6 vertebral body with up to 30% height loss without bony retropulsion. Vertebral body height otherwise maintained with no other acute or chronic fracture. Underlying bone marrow signal intensity is markedly heterogeneous, nonspecific, but could reflect underlying osteoporosis. No discrete  or worrisome osseous lesions. No other abnormal marrow edema. Cord: Small syrinx involving the thoracic spinal cord measures up to 3 mm in maximal diameter, extending from the visualized upper cord to approximately the T8-9 level. Signal intensity within the cord is otherwise normal. Underlying normal cord caliber and morphology. Paraspinal and other soft tissues: Mild paraspinous edema adjacent to the acute T9 and T10 compression fractures. Paraspinous soft tissues demonstrate no other acute finding. Moderate layering right pleural effusion. Visualized visceral structures otherwise unremarkable. Disc levels: Normal expected for age multilevel disc desiccation seen  throughout the thoracic spine. No significant disc bulge or focal disc herniation. Three-4 mm bony retropulsion at the levels of T10, T12, and L1 related to the underlying compression fractures without significant spinal stenosis. No other significant spinal stenosis within the thoracic spine. Foramina remain patent. MRI LUMBAR SPINE FINDINGS Segmentation: Standard. Lowest well-formed disc space labeled the L5-S1 level. Alignment: Mild exaggeration of the normal lumbar lordosis. No listhesis. Vertebrae: Late subacute to chronic compression fractures involving the T12 and L1 vertebral bodies again noted. Vertebral body height otherwise maintained with no other acute, subacute, or chronic compression fracture. Underlying bone marrow signal intensity is markedly heterogeneous without discrete or worrisome osseous lesion. No other abnormal marrow edema. Conus medullaris and cauda equina: Conus extends to the T12-L1 level. Conus and cauda equina appear normal. Paraspinal and other soft tissues: Chronic fatty atrophy noted throughout the posterior paraspinous musculature. Paraspinous soft tissues demonstrate no acute finding. 1.8 cm simple cyst noted within the interpolar left kidney. Additional 9 mm T1 hyperintense lesion at the inferior pole the left kidney noted, nonspecific, but likely a small proteinaceous and/or hemorrhagic cyst. Hepatic cirrhosis partially visualized. Disc levels: L1-2: Minimal annular disc bulge. Mild facet hypertrophy. No spinal stenosis. Foramina remain patent. L2-3: Disc desiccation with minimal annular disc bulge. Mild facet hypertrophy. No stenosis. L3-4: Minimal annular disc bulge. Mild facet hypertrophy. No canal or foraminal stenosis. L4-5: Disc desiccation with minimal annular disc bulge. Mild facet hypertrophy. No stenosis. L5-S1: Negative interspace. Mild facet hypertrophy. No canal or foraminal stenosis. IMPRESSION: 1. Acute compression fracture involving the T10 vertebral body with up  to 60% height loss and 4 mm bony retropulsion. No significant spinal stenosis. 2. Acute compression fracture involving the T9 vertebral body with up to 25% height loss without significant bony retropulsion. 3. Late subacute to chronic severe compression fractures involving the T12 and L1 vertebral bodies with 80-90% height loss and up to 4 mm bony retropulsion. No significant spinal stenosis. 4. Markedly heterogeneous appearance of the visualized bone marrow, nonspecific, but could reflect underlying osteoporosis. 5. Small syrinx measuring up to 3 mm involving the majority of the thoracic spinal cord, terminating at the level of T8-9. 6. Moderate layering right pleural effusion. 7. Hepatic cirrhosis. Electronically Signed   By: Jeannine Boga M.D.   On: 10/09/2020 20:23   MR LUMBAR SPINE WO CONTRAST  Result Date: 10/09/2020 CLINICAL DATA:  Initial evaluation for acute spine fracture. EXAM: MRI THORACIC AND LUMBAR SPINE WITHOUT CONTRAST TECHNIQUE: Multiplanar and multiecho pulse sequences of the thoracic and lumbar spine were obtained without intravenous contrast. COMPARISON:  Prior CT from 10/08/2020. FINDINGS: MRI THORACIC SPINE FINDINGS Alignment: Trace sigmoid scoliosis. Alignment otherwise normal with preservation of the normal thoracic kyphosis. No listhesis. Vertebrae: Acute biconcave compression fracture seen involving the T9 vertebral body. Associated mild 25% central height loss with trace 3 mm bony retropulsion. Additional acute appearing compression fracture involving the T10 vertebral body with up to 60%  height loss and 4 mm bony retropulsion. Associated marrow edema about these fractures extend to involve the posterior elements. Severe compression deformities involving the T12 and L1 vertebral bodies with up to 80-90% height loss and 4 mm bony retropulsion are late subacute to chronic in appearance with only trace residual marrow edema. Additional mild chronic compression fracture involving  the T6 vertebral body with up to 30% height loss without bony retropulsion. Vertebral body height otherwise maintained with no other acute or chronic fracture. Underlying bone marrow signal intensity is markedly heterogeneous, nonspecific, but could reflect underlying osteoporosis. No discrete or worrisome osseous lesions. No other abnormal marrow edema. Cord: Small syrinx involving the thoracic spinal cord measures up to 3 mm in maximal diameter, extending from the visualized upper cord to approximately the T8-9 level. Signal intensity within the cord is otherwise normal. Underlying normal cord caliber and morphology. Paraspinal and other soft tissues: Mild paraspinous edema adjacent to the acute T9 and T10 compression fractures. Paraspinous soft tissues demonstrate no other acute finding. Moderate layering right pleural effusion. Visualized visceral structures otherwise unremarkable. Disc levels: Normal expected for age multilevel disc desiccation seen throughout the thoracic spine. No significant disc bulge or focal disc herniation. Three-4 mm bony retropulsion at the levels of T10, T12, and L1 related to the underlying compression fractures without significant spinal stenosis. No other significant spinal stenosis within the thoracic spine. Foramina remain patent. MRI LUMBAR SPINE FINDINGS Segmentation: Standard. Lowest well-formed disc space labeled the L5-S1 level. Alignment: Mild exaggeration of the normal lumbar lordosis. No listhesis. Vertebrae: Late subacute to chronic compression fractures involving the T12 and L1 vertebral bodies again noted. Vertebral body height otherwise maintained with no other acute, subacute, or chronic compression fracture. Underlying bone marrow signal intensity is markedly heterogeneous without discrete or worrisome osseous lesion. No other abnormal marrow edema. Conus medullaris and cauda equina: Conus extends to the T12-L1 level. Conus and cauda equina appear normal. Paraspinal  and other soft tissues: Chronic fatty atrophy noted throughout the posterior paraspinous musculature. Paraspinous soft tissues demonstrate no acute finding. 1.8 cm simple cyst noted within the interpolar left kidney. Additional 9 mm T1 hyperintense lesion at the inferior pole the left kidney noted, nonspecific, but likely a small proteinaceous and/or hemorrhagic cyst. Hepatic cirrhosis partially visualized. Disc levels: L1-2: Minimal annular disc bulge. Mild facet hypertrophy. No spinal stenosis. Foramina remain patent. L2-3: Disc desiccation with minimal annular disc bulge. Mild facet hypertrophy. No stenosis. L3-4: Minimal annular disc bulge. Mild facet hypertrophy. No canal or foraminal stenosis. L4-5: Disc desiccation with minimal annular disc bulge. Mild facet hypertrophy. No stenosis. L5-S1: Negative interspace. Mild facet hypertrophy. No canal or foraminal stenosis. IMPRESSION: 1. Acute compression fracture involving the T10 vertebral body with up to 60% height loss and 4 mm bony retropulsion. No significant spinal stenosis. 2. Acute compression fracture involving the T9 vertebral body with up to 25% height loss without significant bony retropulsion. 3. Late subacute to chronic severe compression fractures involving the T12 and L1 vertebral bodies with 80-90% height loss and up to 4 mm bony retropulsion. No significant spinal stenosis. 4. Markedly heterogeneous appearance of the visualized bone marrow, nonspecific, but could reflect underlying osteoporosis. 5. Small syrinx measuring up to 3 mm involving the majority of the thoracic spinal cord, terminating at the level of T8-9. 6. Moderate layering right pleural effusion. 7. Hepatic cirrhosis. Electronically Signed   By: Jeannine Boga M.D.   On: 10/09/2020 20:23   CT Abdomen Pelvis W Contrast  Result  Date: 10/08/2020 CLINICAL DATA:  Nonlocalized acute abdominal pain. Left-sided abdominal pain for 2 months. EXAM: CT ABDOMEN AND PELVIS WITH CONTRAST  TECHNIQUE: Multidetector CT imaging of the abdomen and pelvis was performed using the standard protocol following bolus administration of intravenous contrast. CONTRAST:  136mL OMNIPAQUE IOHEXOL 300 MG/ML  SOLN COMPARISON:  CT abdomen pelvis 12/05/2015 FINDINGS: Lower chest: Trace right pleural effusion with associated right lower lobe passive atelectasis. Mitral annular calcifications. Possible tiny hiatal hernia. Hepatobiliary: Nodular hepatic contour. The hepatic parenchyma is diffusely hypodense compared to the splenic parenchyma consistent with fatty infiltration. No focal liver abnormality. Status post cholecystectomy. No biliary dilatation. Pancreas: No focal lesion. Normal pancreatic contour. No surrounding inflammatory changes. No main pancreatic ductal dilatation. Spleen: Normal in size without focal abnormality. Adrenals/Urinary Tract: No adrenal nodule bilaterally. Bilateral kidneys enhance symmetrically. 1.7 cm fluid density lesion within the left kidney likely represents a simple renal cyst. No hydronephrosis. No hydroureter. The urinary bladder is unremarkable. On delayed imaging, there is no urothelial wall thickening and there are no filling defects in the opacified portions of the bilateral collecting systems or ureters. Stomach/Bowel: Stomach is within normal limits. No evidence of bowel wall thickening or dilatation. Appendix appears normal. Vascular/Lymphatic: Recanalized paraumbilical vein. Venous collateral are noted. Slight left ovarian venous congestion. The main portal, splenic, superior mesenteric veins are patent. No abdominal aorta or iliac aneurysm. Moderate to severe atherosclerotic plaque of the aorta and its branches. No abdominal, pelvic, or inguinal lymphadenopathy. Reproductive: Uterus and bilateral adnexa are unremarkable. Other: No intraperitoneal free fluid. No intraperitoneal free gas. No organized fluid collection. Musculoskeletal: No abdominal wall hernia or abnormality.  Diffusely decreased bone density. No suspicious lytic or blastic osseous lesions. No acute displaced fracture. Multilevel degenerative changes of the spine with interval development of compression fractures of the T10, T12, L1 with greater than 90% height loss. IMPRESSION: 1. Cirrhosis with portal hypertension. No definite focal hepatic lesion; however limited evaluation on this single phase contrast study. Recommend MRI liver protocol for further evaluation. 2. Trace volume right pleural effusion. 3. Possible tiny hiatal hernia. 4. Interval development of T10, T12, L1 compression fractures with greater than 90% height loss. Correlate with point tenderness to palpation to evaluate for acute component. 5. Aortic Atherosclerosis (ICD10-I70.0) including mitral annular calcifications. Electronically Signed   By: Iven Finn M.D.   On: 10/08/2020 19:58   US Abdomen Limited RUQ (LIVER/GB)  Result Date: 10/09/2020 CLINICAL DATA:  Transaminitis EXAM: ULTRASOUND ABDOMEN LIMITED RIGHT UPPER QUADRANT COMPARISON:  CT abdomen pelvis 10/08/2020 FINDINGS: Gallbladder: No gallstones or wall thickening visualized. No sonographic Murphy sign noted by sonographer. Common bile duct: Diameter: 8 mm Liver: Nodular hepatic contour suspicious for cirrhosis. No focal hepatic lesion. Portal vein is patent on color Doppler imaging with normal direction of blood flow towards the liver. Other: None. IMPRESSION: Cirrhotic liver morphology without focal hepatic lesion. Electronically Signed   By: Miachel Roux M.D.   On: 10/09/2020 09:06        Scheduled Meds: . citalopram  20 mg Oral Daily  . feeding supplement  237 mL Oral BID BM  . hydrALAZINE  10 mg Oral TID  . levothyroxine  137 mcg Oral Q0600  . lisinopril  40 mg Oral Daily  . omega-3 acid ethyl esters  1 g Oral Daily  . pantoprazole (PROTONIX) IV  40 mg Intravenous QHS  . predniSONE  40 mg Oral Q breakfast   Continuous Infusions: . sodium chloride 75 mL/hr at  10/09/20 1112  LOS: 2 days   Time spent= 35 mins    Marlicia Sroka Arsenio Loader, MD [facility]  If 7PM-7AM, please contact night-coverage  10/10/2020, 7:34 AM

## 2020-10-10 NOTE — Progress Notes (Signed)
OT Cancellation Note  Patient Details Name: [name] MRN: [id number] DOB: 1951-02-12   Cancelled Treatment:    Reason Eval/Treat Not Completed: Pain limiting ability to participate. Pt requested OT return later, will follow up as time allows  Britt Bottom 10/10/2020, 10:30 AM

## 2020-10-10 NOTE — Progress Notes (Signed)
  Echocardiogram 2D Echocardiogram has been performed.  [name] 10/10/2020, 9:54 AM

## 2020-10-11 ENCOUNTER — Inpatient Hospital Stay (HOSPITAL_COMMUNITY): Payer: Medicare Other

## 2020-10-11 LAB — GLUCOSE, CAPILLARY
Glucose-Capillary: 103 mg/dL — ABNORMAL HIGH (ref 70–99)
Glucose-Capillary: 119 mg/dL — ABNORMAL HIGH (ref 70–99)
Glucose-Capillary: 122 mg/dL — ABNORMAL HIGH (ref 70–99)
Glucose-Capillary: 123 mg/dL — ABNORMAL HIGH (ref 70–99)
Glucose-Capillary: 281 mg/dL — ABNORMAL HIGH (ref 70–99)

## 2020-10-11 LAB — CBC WITH DIFFERENTIAL/PLATELET
Abs Immature Granulocytes: 0.05 10*3/uL (ref 0.00–0.07)
Basophils Absolute: 0 10*3/uL (ref 0.0–0.1)
Basophils Relative: 0 %
Eosinophils Absolute: 0.1 10*3/uL (ref 0.0–0.5)
Eosinophils Relative: 1 %
HCT: 31.2 % — ABNORMAL LOW (ref 36.0–46.0)
Hemoglobin: 11.5 g/dL — ABNORMAL LOW (ref 12.0–15.0)
Immature Granulocytes: 1 %
Lymphocytes Relative: 14 %
Lymphs Abs: 1.5 10*3/uL (ref 0.7–4.0)
MCH: 33.3 pg (ref 26.0–34.0)
MCHC: 36.9 g/dL — ABNORMAL HIGH (ref 30.0–36.0)
MCV: 90.4 fL (ref 80.0–100.0)
Monocytes Absolute: 0.7 10*3/uL (ref 0.1–1.0)
Monocytes Relative: 6 %
Neutro Abs: 8.2 10*3/uL — ABNORMAL HIGH (ref 1.7–7.7)
Neutrophils Relative %: 78 %
Platelets: 195 10*3/uL (ref 150–400)
RBC: 3.45 MIL/uL — ABNORMAL LOW (ref 3.87–5.11)
RDW: 13 % (ref 11.5–15.5)
WBC: 10.5 10*3/uL (ref 4.0–10.5)
nRBC: 0 % (ref 0.0–0.2)

## 2020-10-11 LAB — COMPREHENSIVE METABOLIC PANEL
ALT: 13 U/L (ref 0–44)
AST: 17 U/L (ref 15–41)
Albumin: 3.1 g/dL — ABNORMAL LOW (ref 3.5–5.0)
Alkaline Phosphatase: 101 U/L (ref 38–126)
Anion gap: 5 (ref 5–15)
BUN: 10 mg/dL (ref 8–23)
CO2: 32 mmol/L (ref 22–32)
Calcium: 8.9 mg/dL (ref 8.9–10.3)
Chloride: 90 mmol/L — ABNORMAL LOW (ref 98–111)
Creatinine, Ser: 0.48 mg/dL (ref 0.44–1.00)
GFR, Estimated: >60 mL/min (ref >60)
Glucose, Bld: 97 mg/dL (ref 70–99)
Potassium: 3.9 mmol/L (ref 3.5–5.1)
Sodium: 127 mmol/L — ABNORMAL LOW (ref 135–145)
Total Bilirubin: 0.7 mg/dL (ref 0.3–1.2)
Total Protein: 5.6 g/dL — ABNORMAL LOW (ref 6.5–8.1)

## 2020-10-11 LAB — CBC
HCT: 33.8 % — ABNORMAL LOW (ref 36.0–46.0)
Hemoglobin: 12.6 g/dL (ref 12.0–15.0)
MCH: 33.3 pg (ref 26.0–34.0)
MCHC: 37.3 g/dL — ABNORMAL HIGH (ref 30.0–36.0)
MCV: 89.4 fL (ref 80.0–100.0)
Platelets: 213 10*3/uL (ref 150–400)
RBC: 3.78 MIL/uL — ABNORMAL LOW (ref 3.87–5.11)
RDW: 13.1 % (ref 11.5–15.5)
WBC: 7.9 10*3/uL (ref 4.0–10.5)
nRBC: 0 % (ref 0.0–0.2)

## 2020-10-11 LAB — PROTIME-INR
INR: 1 (ref 0.8–1.2)
Prothrombin Time: 12.9 seconds (ref 11.4–15.2)

## 2020-10-11 LAB — MAGNESIUM: Magnesium: 1.6 mg/dL — ABNORMAL LOW (ref 1.7–2.4)

## 2020-10-11 IMAGING — CT CT HEAD W/O CM
4 series · 15 of 47 positions shown, 17 images · non-contrast
Comparison: None.

CLINICAL DATA: Delirium

EXAM:
CT HEAD WITHOUT CONTRAST
TECHNIQUE: Contiguous axial images were obtained from the base of the skull
through the vertex without intravenous contrast.

[Series 3: head wo · axial · 0.39mm/px · z∈[+1172,+1287]mm · 7 of 31 slices shown, 9 images]
[im 4/31  brain]
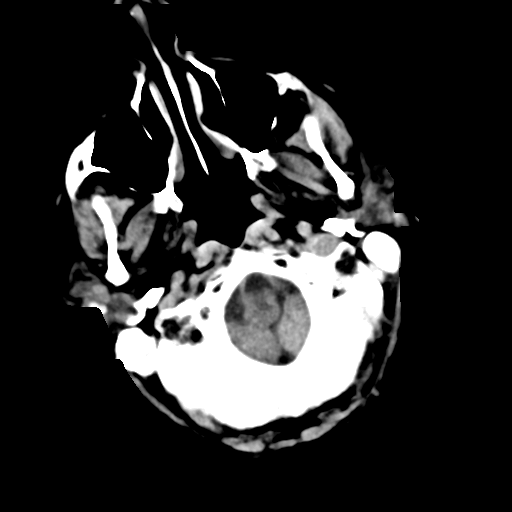
[im 4/31  bone]
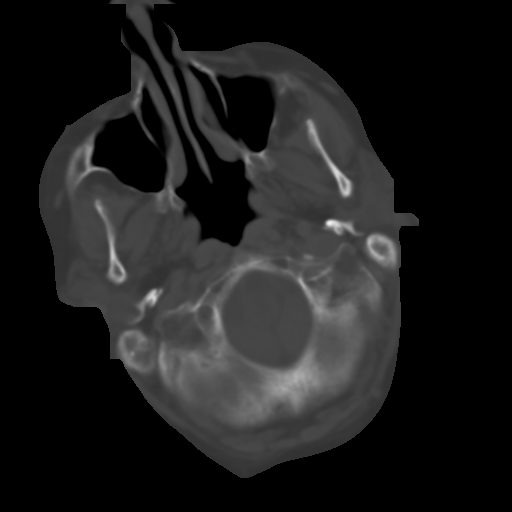
[im 8/31  brain]
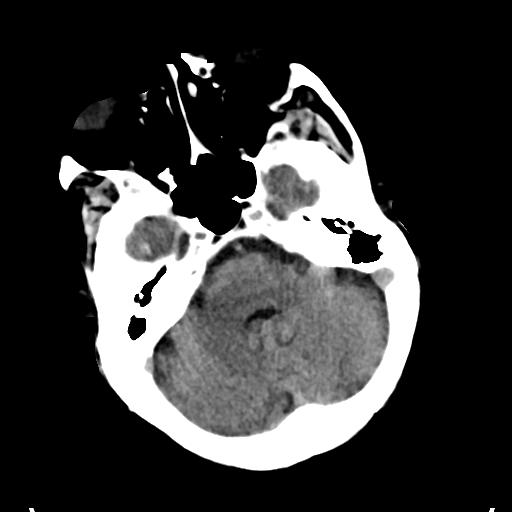
[im 12/31  brain]
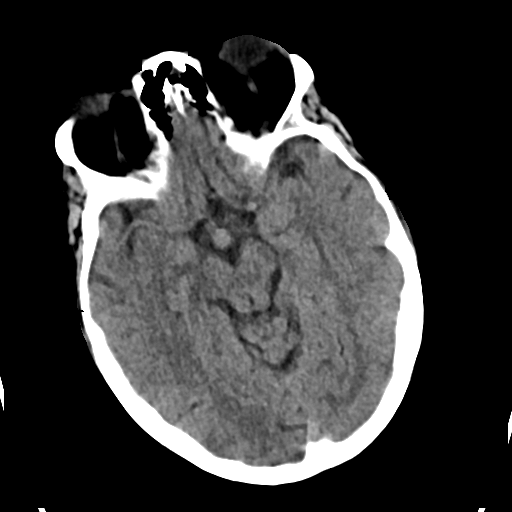
[im 16/31  brain]
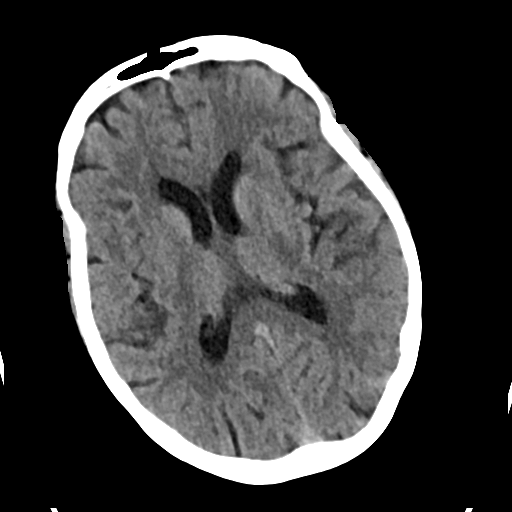
[im 19/31  brain]
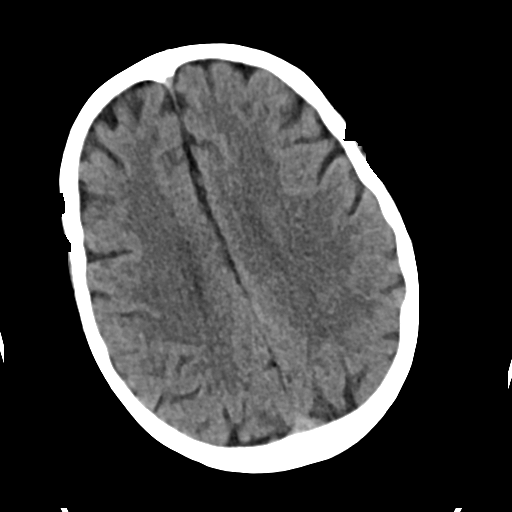
[im 19/31  bone]
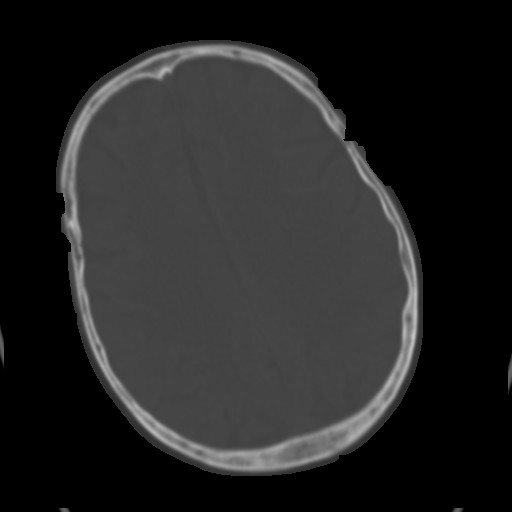
[im 23/31  brain]
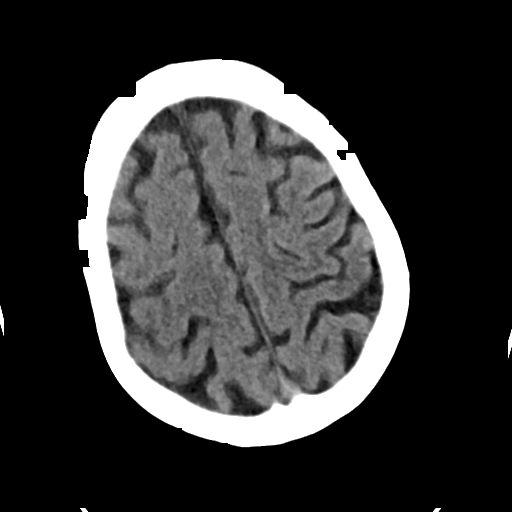
[im 27/31  brain]
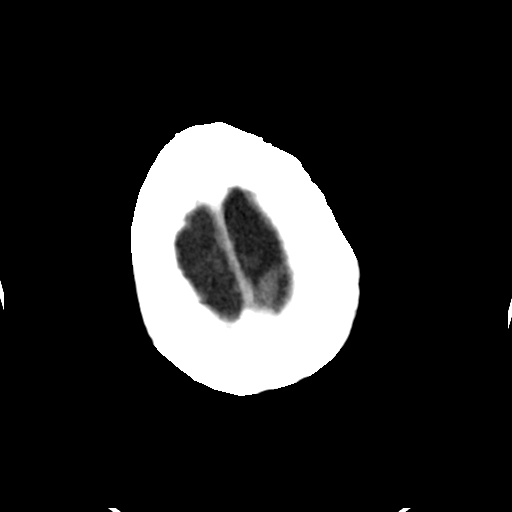

[Series 4: head bone · axial · 0.39mm/px · z∈[+1171,+1187]mm · 2 of 76 slices shown]
[im 8/76  bone]
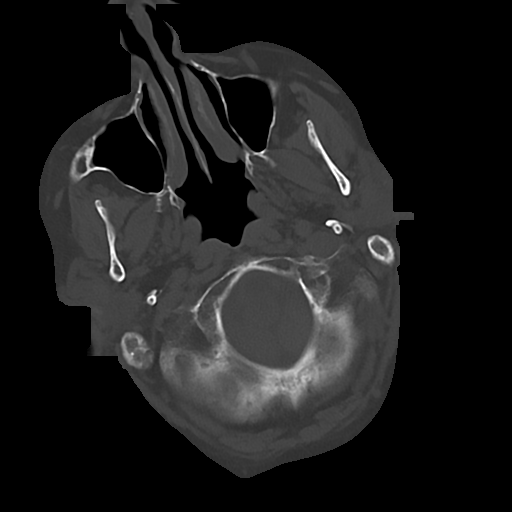
[im 16/76  bone]
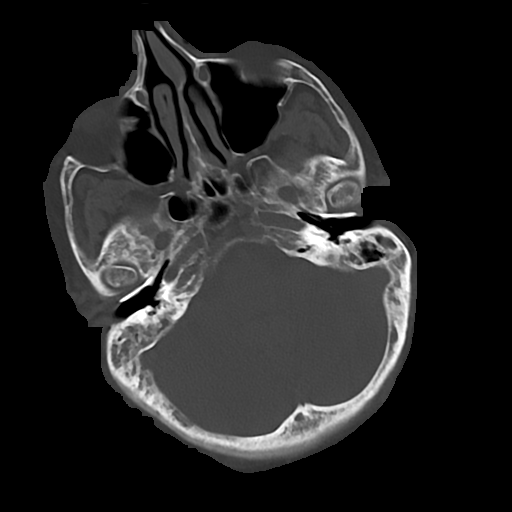

[Series 5: cor soft · coronal · 0.31mm/px · 3 of 73 slices shown]
[im 25/73  brain]
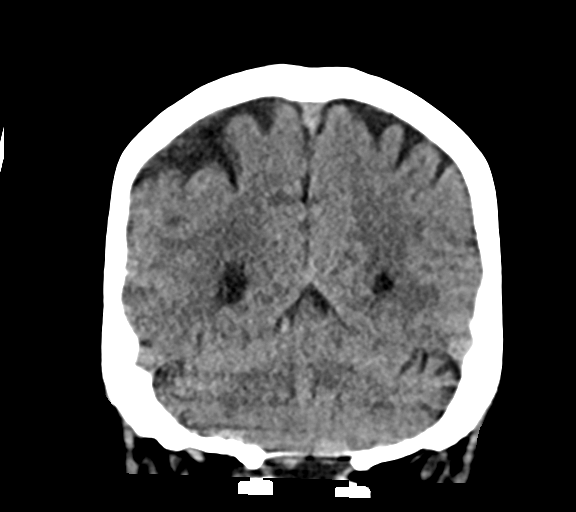
[im 33/73  brain]
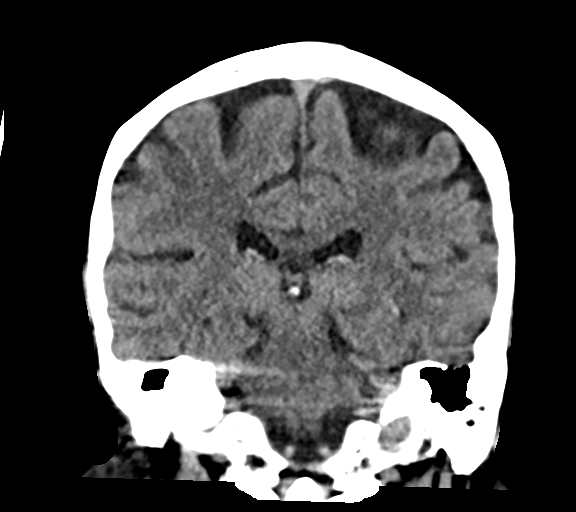
[im 41/73  brain]
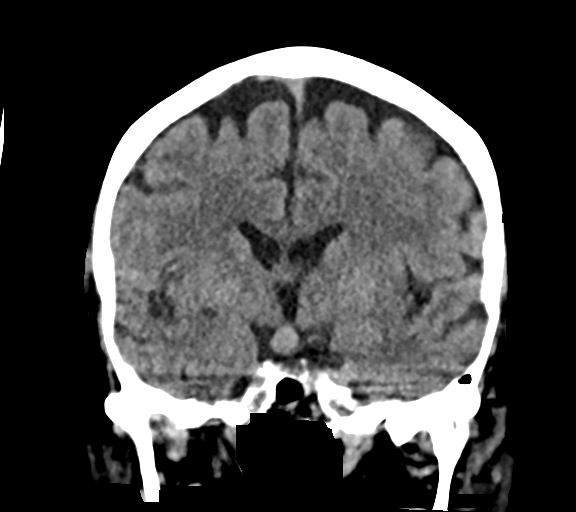

[Series 6: sag soft · sagittal · 0.29mm/px · 3 of 56 slices shown]
[im 19/56  brain]
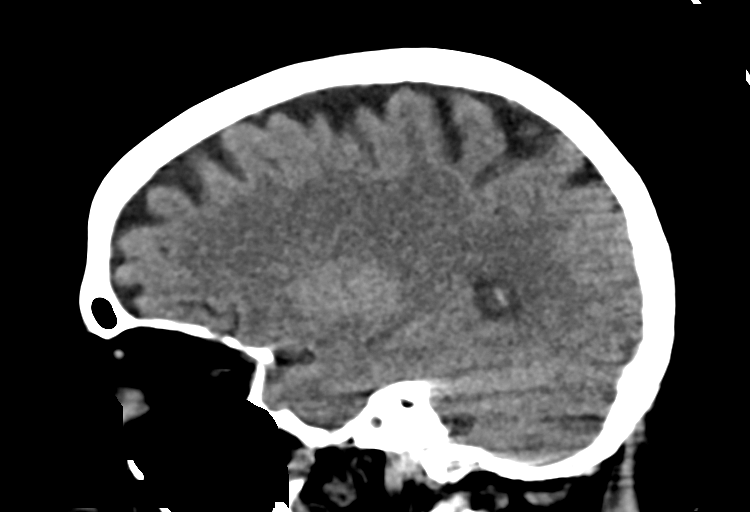
[im 28/56  brain]
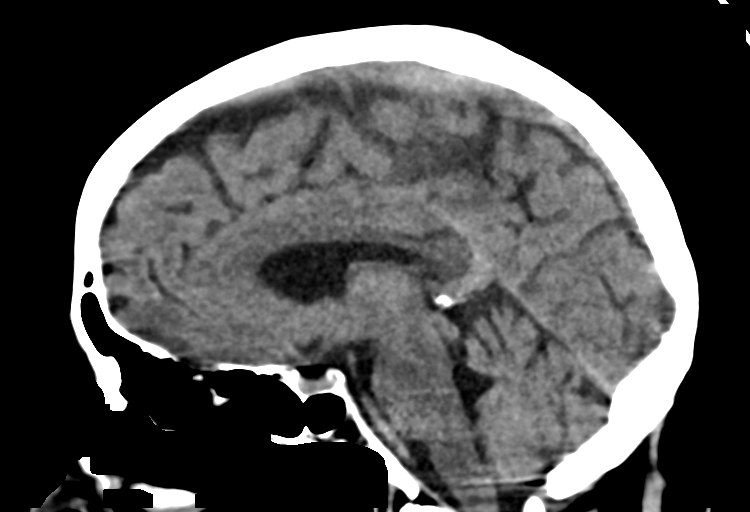
[im 37/56  brain]
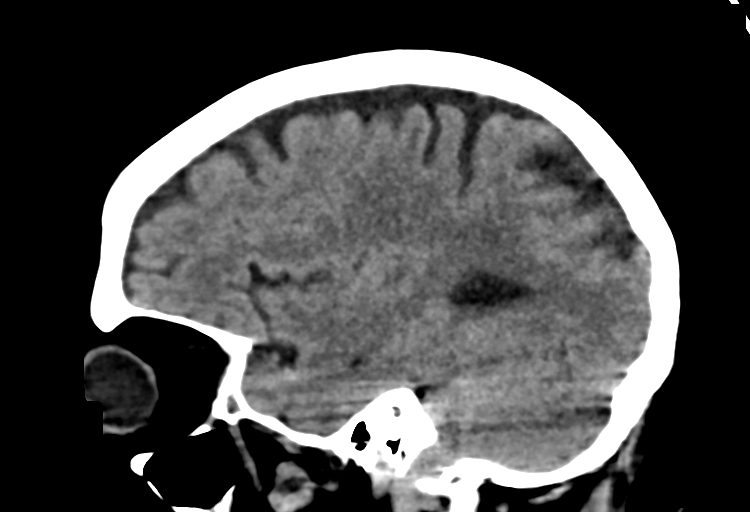

[15 of 47 positions shown; findings below may reference images not displayed]

FINDINGS: Brain:

Cerebral ventricle sizes are concordant with the degree of cerebral
volume loss. Patchy and confluent areas of decreased attenuation are
noted throughout the deep and periventricular white matter of the
cerebral hemispheres bilaterally, compatible with chronic
microvascular ischemic disease.

No evidence of large-territorial acute infarction. No parenchymal
hemorrhage. No mass lesion. No extra-axial collection.

No mass effect or midline shift. No hydrocephalus. Basilar cisterns
are patent.

Vascular: Aneurysmal dilatation of the basilar artery ([DATE], [DATE]).

Skull: Heterogeneous appearance of the osseous structures. No acute
fracture or focal lesion.

Sinuses/Orbits: Paranasal sinuses and mastoid air cells are clear.
The orbits are unremarkable.

Other: None.
IMPRESSION: 1. Aneurysmal dilatation of the basilar artery. Recommend CT
angioagraphy for further evaluation.
2. Nonspecific heterogeneous appearance of the osseous structures.
3. No acute intracranial abnormality.

These results will be called to the ordering clinician or
representative by the Radiologist Assistant, and communication
documented in the PACS or [REDACTED].

## 2020-10-11 MED ORDER — FUROSEMIDE 10 MG/ML IJ SOLN
40.0000 mg | Freq: Four times a day (QID) | INTRAMUSCULAR | Status: AC
  Administered 2020-10-11 (×2): 40 mg via INTRAVENOUS
  Filled 2020-10-11 (×2): qty 4

## 2020-10-11 MED ORDER — METHYLPREDNISOLONE SODIUM SUCC 40 MG IJ SOLR
40.0000 mg | Freq: Three times a day (TID) | INTRAMUSCULAR | Status: DC
  Administered 2020-10-11 – 2020-10-12 (×4): 40 mg via INTRAVENOUS
  Filled 2020-10-11 (×4): qty 1

## 2020-10-11 MED ORDER — AMLODIPINE BESYLATE 5 MG PO TABS
5.0000 mg | ORAL_TABLET | Freq: Every day | ORAL | Status: DC
  Administered 2020-10-11 – 2020-10-12 (×2): 5 mg via ORAL
  Filled 2020-10-11 (×2): qty 1

## 2020-10-11 MED ORDER — SODIUM CHLORIDE 0.9 % IV SOLN: INTRAVENOUS | Status: DC

## 2020-10-11 MED ORDER — HYDRALAZINE HCL 25 MG PO TABS
25.0000 mg | ORAL_TABLET | Freq: Three times a day (TID) | ORAL | Status: DC
  Administered 2020-10-11 – 2020-10-14 (×10): 25 mg via ORAL
  Filled 2020-10-11 (×10): qty 1

## 2020-10-11 NOTE — Progress Notes (Signed)
Rounded on patient to find abdominal breathing, slurred speech and change in mentition. No obvious deficits to indicate stroke. Apical pulse noted to be high. Obtained EKG, reads afib w/ rvr. On call hospitalist notified.

## 2020-10-11 NOTE — Progress Notes (Signed)
Patient daughter Seleny Allbright) called to ask about the time of the anticipated IR procedure. Explained to [name] that though the procedure is planned for today, it is not scheduled for a particular time. [name] expressed dissatisfaction with this answer, expressing concerns about her availability without knowing a time and insisted that she receive a call with an availability. RN expressed empathy for the difficulty of the situation but further explained that an exact time cannot be offered at that time. [name] responded becoming irate and verbally abusive with several obscenities. RN asked [name] to call back for further information.

## 2020-10-11 NOTE — Progress Notes (Addendum)
Overnight floor coverage progress note  RN concerned that patient is confused and disoriented.  She claimed that the TV removed her covers and chairs were moving in the room.  No focal neurodeficit otherwise.  Satting 100% on 2 L supplemental oxygen.  She has not received any sedating medications.  -Stat head CT -Stat ABG -Stat ammonia level  Addendum/update 1:17 AM: Blood gas reassuring.  No significant elevation of ammonia.  Head CT negative for acute intracranial abnormality.  Does show aneurysmal dilation of the basilar artery.  CT angiography has been ordered for further evaluation.

## 2020-10-11 NOTE — Progress Notes (Signed)
Overnight floor coverage progress note  Received a page from RN around 3:45 AM informing me that he felt that the patient's speech was slurred and there was change in mentation.  EKG done by RN at that time showing sinus rhythm with PVCs versus ?Afib.  No documented history of A. fib.  Her blood glucose was checked at that time and she was not hypoglycemic (CBG 119).  I spoke to the patient over the phone and was able to understand her speech.  She was AAO x4 and answering questions appropriately.  I subsequently saw the patient in person.  She was resting comfortably, no signs of distress.  No change in vital signs.  I was able to understand her speech.  Face symmetric, no facial droop.  Pupils equal and reactive to light.  Extraocular movements intact.  No focal motor or sensory deficit.  Strength 5 out of 5 in bilateral upper and lower extremities.  Sensation to light touch intact throughout.  AAO x4 and answering questions appropriately.  Her only complaint was that she is very frustrated from being in the hospital and not being able to ambulate much as she left her cane at home.  States she had one physical therapy session here but when the therapist came to see her yesterday she was not able to participate.  She is interested in continuing physical therapy sessions so she does not lay in bed all the time. -Continue to monitor closely -Continue physical therapy sessions -Repeat EKG to assess rhythm

## 2020-10-11 NOTE — Progress Notes (Signed)
Physical Therapy Treatment Patient Details Name: [name] MRN: [id number] DOB: June 01, 1951 Today's Date: 10/11/2020    History of Present Illness Patient is a 70 y/o female who presents on 10/09/20 with low back/abdominal pain and new urinary/bowel incontinence. Found to have hyponatremia, hypokalemia and hypomagnesemia. CT abdomen/pelvis showed cirrhosis with portal HTN, right pleural effusion with interval development of T10, T8 T12 and L1 compression fractures. Plan for kyphoplasty 10/12/20. PMH includes liver cirrhosis secondary to alcohol abuse, HTN.    PT Comments    Patient progressing slowly towards PT goals. Pt tolerated bed mobility, transfers and short distance gait training with Min A for balance/safety and use of RW. Noted to have 2-3/4 DOE with HR up to 146 bpm max with activity. Limited by fatigue and pain as well. Kyphoplasty cancelled today due to elevated BP. BP 143/90 at time of session. Pt noted to have episodes of confusion and impaired memory especially relating to precautions for back. Pt with worsened weakness and activity tolerance due to lack of mobility in the hospital. Will continue to follow and progress as able.   Follow Up Recommendations  Home health PT;Supervision for mobility/OOB     Equipment Recommendations  None recommended by PT    Recommendations for Other Services       Precautions / Restrictions Precautions Precautions: Back;Fall;Other (comment) Precaution Booklet Issued: No Precaution Comments: reviewed back precautions, watch BP (been running high) Restrictions Weight Bearing Restrictions: No    Mobility  Bed Mobility Overal bed mobility: Needs Assistance Bed Mobility: Rolling;Sidelying to Sit;Supine to Sit Rolling: Min assist Sidelying to sit: Min assist;HOB elevated Supine to sit: Min guard;HOB elevated   Sit to sidelying: Min guard;HOB elevated General bed mobility comments: Initially pt sitting straight up in bed to urinate;  cues to log roll and step by step assist for sequencing on second attempt. DId not tolerate sitting up initially due to pain in RUE with taking BP so returned self to supine.    Transfers Overall transfer level: Needs assistance Equipment used: Rolling walker (2 wheeled) Transfers: Sit to/from [facility] Sit to Stand: Min assist Stand pivot transfers: Min assist       General transfer comment: Assist to power to standing with cues for hand placement/technique. Performed x2 from EOB, x1 from [facility]. SPT bed to/from [facility] with Min A.  Ambulation/Gait Ambulation/Gait assistance: Min guard Gait Distance (Feet): 20 Feet Assistive device: Rolling walker (2 wheeled) Gait Pattern/deviations: Step-through pattern;Decreased stride length;Decreased step length - right;Decreased step length - left Gait velocity: decreased   General Gait Details: Slow, guarded and shortened step lengths with assist for balance and RW management' 2-3/4 DOE. SP02 remained >92% on 3L/min 02 [place]. Limited by pain/fatigue. HR up to 146 bpm max.   Stairs             Wheelchair Mobility    Modified Rankin (Stroke Patients Only)       Balance Overall balance assessment: History of Falls;Needs assistance Sitting-balance support: Feet supported;No upper extremity supported Sitting balance-Leahy Scale: Fair     Standing balance support: During functional activity Standing balance-Leahy Scale: Poor Standing balance comment: Requires Ue support in standing or Min A.                            Cognition Arousal/Alertness: Awake/alert Behavior During Therapy: WFL for tasks assessed/performed Overall Cognitive Status: Impaired/Different from baseline Area of Impairment: Memory  Memory: Decreased short-term memory         General Comments: Episodes of confusion noted.      Exercises      General Comments General comments (skin integrity, edema,  etc.): SP02 stayed >92% on 3L/min 02 [place]; HR up to 146 bpm max with activity. BP 143/90.      Pertinent Vitals/Pain Pain Assessment: Faces Faces Pain Scale: Hurts even more Pain Location: back with movement, RUE when taking BP Pain Descriptors / Indicators: Aching;Grimacing;Guarding;Sore Pain Intervention(s): Monitored during session;Repositioned;Limited activity within patient's tolerance    Home Living                      Prior Function            PT Goals (current goals can now be found in the care plan section) Progress towards PT goals: Progressing toward goals (slowly)    Frequency    Min 3X/week      PT Plan Current plan remains appropriate    Co-evaluation              AM-PAC PT "6 Clicks" Mobility   Outcome Measure  Help needed turning from your back to your side while in a flat bed without using bedrails?: A Little Help needed moving from lying on your back to sitting on the side of a flat bed without using bedrails?: A Little Help needed moving to and from a bed to a chair (including a wheelchair)?: A Little Help needed standing up from a chair using your arms (e.g., wheelchair or bedside chair)?: A Little Help needed to walk in hospital room?: A Little Help needed climbing 3-5 steps with a railing? : A Lot 6 Click Score: 17    End of Session Equipment Utilized During Treatment: Oxygen;Gait belt Activity Tolerance: Patient limited by fatigue;Patient limited by pain Patient left: in bed;with call bell/phone within reach;with family/visitor present;with nursing/sitter in room Nurse Communication: Mobility status;Other (comment) (improved BP) PT Visit Diagnosis: Pain;Difficulty in walking, not elsewhere classified (R26.2);Muscle weakness (generalized) (M62.81) Pain - part of body:  (back)     Time: 0814-4818 PT Time Calculation (min) (ACUTE ONLY): 27 min  Charges:  $Therapeutic Activity: 23-37 mins                     Marisa Severin, PT,  DPT Acute Rehabilitation Services Pager [phone] Office [phone]       Marguarite Arbour A Sabra Heck 10/11/2020, 2:47 PM

## 2020-10-11 NOTE — Progress Notes (Signed)
Document left with Network engineer.

## 2020-10-11 NOTE — Progress Notes (Signed)
PROGRESS NOTE    [name]  BSW:[id number] DOB: [birth year] DOA: 10/08/2020 PCP: [name], MD   Brief Narrative:  70 year old with history of HTN, hypothyroidism, GERD, liver cirrhosis presents with abdominal and low back pain.  Upon admission patient had hyponatremia, hypokalemia, hypomagnesemia CT abdomen pelvis showed cirrhosis with portal hypertension, right pleural effusion with interval development of T10, T8 T12 and L1 compression fracture.  MRI confirmed these findings.  IR consulted who recommended kyphoplasty. Plans for it today.    Assessment & Plan:   Principal Problem:   Back pain Active Problems:   HYPERTENSION, BENIGN ESSENTIAL   Hyponatremia   Hypothyroidism   Hypokalemia due to excessive gastrointestinal loss of potassium   Abdominal pain   Hypomagnesemia   Compression fracture of body of thoracic vertebra (HCC)   History of cirrhosis of liver    Back pain due Thoracic compression fracture -Significant amount of pain.  CT abdomen pelvis shows compression fracture T10, T12 and L1 -Pain control-oxycodone IR 10 mg and IV morphine as needed.  Bowel regimen. -IR consulted, MRI performed.  Plans for kyphoplasty today  Acute respiratory distress with bilateral diminished breath sounds - Stop IV fluids.  Solu-Medrol increased to 40 mg every 8 hours.  Lasix 40 mg IV every 6 hours X2 doses  Abdominal pain, improved Liver cirrhosis with history of alcohol use History of cholecystectomy -Continue PPI.  Pain control -LFTs relatively normal, lipase normal -Right upper quadrant ultrasound-showed cirrhotic pathology -Acute hepatitis-negative -Echo- EF 55 to 60%, grade 1 DD, severely elevated pulmonary artery pressure - Outpatient GI follow-up, notified about her GI to arrange for follow-up -Endoscopy 06/2018- showed gastritis and multiple duodenal ulcer secondary to NSAID  Vitamin D deficiency - Supplement started  Daily tobacco use with history  of COPD, mild exacerbation -Solu-Medrol 40 mg every 8 hours, bronchodilators, I-S, flutter valve  Hyponatremia -Secondary to cirrhosis.  Initially due to intravascular volume depletion required fluids with due to signs of volume overload and has been stopped for now  Hypokalemia, resolved -Replete as needed  Hypomagnesemia -Replete as needed  Hypothyroidism -Low TSH elevated T4.  Reduced to Synthroid 100 mcg daily  Essential hypertension: UnControlled -Continue lisinopril 40 mg daily.  Increase hydralazine 25 mg 3 times daily.  Add Norvasc 5 mg daily  Hyperlipidemia -Continue Lovaza   DVT prophylaxis: SCDs Start: 10/08/20 2128 Code Status: Full code Family Communication: Family at bedside  Status is: Inpatient  Remains inpatient appropriate because:Inpatient level of care appropriate due to severity of illness.  Ongoing evaluation for her thoracic and lumbar fractures causing significant ambulatory dysfunction and back pain.  Plans for kyphoplasty today.  Thereafter she will need PT/OT   Dispo: The patient is from: Home              Anticipated d/c is to: Home              Patient currently is not medically stable to d/c.   Difficult to place patient No   Subjective: Patient seen and examined at bedside reporting of back pain from her fracture.  She also appears slightly dyspneic.  Review of Systems Otherwise negative except as per HPI, including: General: Denies fever, chills, night sweats or unintended weight loss. Resp: Denies cough Cardiac: Denies chest pain, palpitations, orthopnea, paroxysmal nocturnal dyspnea. GI: Denies abdominal pain, nausea, vomiting, diarrhea or constipation GU: Denies dysuria, frequency, hesitancy or incontinence MS: Denies muscle aches, joint pain or swelling Neuro: Denies headache, neurologic deficits (focal weakness, numbness,  tingling), abnormal gait Psych: Denies anxiety, depression, SI/HI/AVH Skin: Denies new rashes or  lesions ID: Denies sick contacts, exotic exposures, travel  Examination: Constitutional: Not in acute distress Respiratory: Diminished breath sounds bilaterally Cardiovascular: Normal sinus rhythm, no rubs Abdomen: Nontender nondistended good bowel sounds Musculoskeletal: No edema noted Skin: No rashes seen Neurologic: CN 2-12 grossly intact.  And nonfocal Psychiatric: Normal judgment and insight. Alert and oriented x 3. Normal mood.   Objective: Vitals:   10/11/20 0056 10/11/20 0151 10/11/20 0155 10/11/20 0349  BP: (!) 188/115 (!) 155/111  (!) 164/102  Pulse:  (!) 114 (!) 101 96  Resp:   20 (!) 21  Temp:    97.8 F (36.6 C)  TempSrc:    Oral  SpO2:  100%  100%  Weight:    84.2 kg  Height:        Intake/Output Summary (Last 24 hours) at 10/11/2020 0739 Last data filed at 10/11/2020 0618 Gross per 24 hour  Intake 2164.98 ml  Output 450 ml  Net 1714.98 ml   Filed Weights   10/09/20 0005 10/11/20 0349  Weight: 81.4 kg 84.2 kg     Data Reviewed:   CBC: Recent Labs  Lab 10/08/20 1641 10/09/20 0020 10/10/20 0346 10/11/20 0403  WBC 8.7 8.9 7.2 10.5  NEUTROABS 7.1  --   --  8.2*  HGB 14.0 13.0 11.6* 11.5*  HCT RESULTS UNAVAILABLE DUE TO INTERFERING SUBSTANCE RESULTS UNAVAILABLE DUE TO INTERFERING SUBSTANCE RESULTS UNAVAILABLE DUE TO INTERFERING SUBSTANCE 31.2*  MCV RESULTS UNAVAILABLE DUE TO INTERFERING SUBSTANCE RESULTS UNAVAILABLE DUE TO INTERFERING SUBSTANCE RESULTS UNAVAILABLE DUE TO INTERFERING SUBSTANCE 90.4  PLT 213 209 160 389   Basic Metabolic Panel: Recent Labs  Lab 10/08/20 1454 10/08/20 1641 10/09/20 0020 10/09/20 0807 10/10/20 0346 10/11/20 0403  NA 124*  --  124* 128* 125* 127*  K 2.5*  --  3.6 3.2* 4.3 3.9  CL 81*  --  85* 86* 89* 90*  CO2 30  --  29 33* 31 32  GLUCOSE 109*  --  106* 89 106* 97  BUN <5*  --  <5* <5* 7* 10  CREATININE 0.47  --  0.51 0.53 0.63 0.48  CALCIUM 8.7*  --  8.2* 8.0* 8.5* 8.9  MG  --  1.1* 2.6*  --  1.8 1.6*   PHOS  --   --  3.4  --   --   --    GFR: Estimated Creatinine Clearance: 67.7 mL/min (by C-G formula based on SCr of 0.48 mg/dL). Liver Function Tests: Recent Labs  Lab 10/08/20 1454 10/09/20 0020 10/10/20 0346 10/11/20 0403  AST 26 25 18 17   ALT 15 15 16 13   ALKPHOS 130* 127* 101 101  BILITOT 1.0 1.2 0.9 0.7  PROT 6.0* 5.9* 5.3* 5.6*  ALBUMIN 3.3* 3.2* 2.8* 3.1*   Recent Labs  Lab 10/08/20 1454  LIPASE 29   No results for input(s): AMMONIA in the last 168 hours. Coagulation Profile: Recent Labs  Lab 10/09/20 0020 10/11/20 0403  INR 1.0 1.0   Cardiac Enzymes: No results for input(s): CKTOTAL, CKMB, CKMBINDEX, TROPONINI in the last 168 hours. BNP (last 3 results) No results for input(s): PROBNP in the last 8760 hours. HbA1C: No results for input(s): HGBA1C in the last 72 hours. CBG: Recent Labs  Lab 10/10/20 1332 10/10/20 1539 10/11/20 0044 10/11/20 0351 10/11/20 0648  GLUCAP 179* 152* 123* 119* 103*   Lipid Profile: No results for input(s): CHOL, HDL, LDLCALC, TRIG, CHOLHDL,  LDLDIRECT in the last 72 hours. Thyroid Function Tests: Recent Labs    10/09/20 0736 10/10/20 0844  TSH 0.228*  --   FREET4  --  1.48*   Anemia Panel: No results for input(s): VITAMINB12, FOLATE, FERRITIN, TIBC, IRON, RETICCTPCT in the last 72 hours. Sepsis Labs: Recent Labs  Lab 10/08/20 1642 10/08/20 2258  LATICACIDVEN 1.8 1.2    Recent Results (from the past 240 hour(s))  Resp Panel by RT-PCR (Flu A&B, Covid) Nasopharyngeal Swab     Status: None   Collection Time: 10/08/20  5:33 PM   Specimen: Nasopharyngeal Swab; Nasopharyngeal(NP) swabs in vial transport medium  Result Value Ref Range Status   SARS Coronavirus 2 by RT PCR NEGATIVE NEGATIVE Final    Comment: (NOTE) SARS-CoV-2 target nucleic acids are NOT DETECTED.  The SARS-CoV-2 RNA is generally detectable in upper respiratory specimens during the acute phase of infection. The lowest concentration of SARS-CoV-2  viral copies this assay can detect is 138 copies/mL. A negative result does not preclude SARS-Cov-2 infection and should not be used as the sole basis for treatment or other patient management decisions. A negative result may occur with  improper specimen collection/handling, submission of specimen other than nasopharyngeal swab, presence of viral mutation(s) within the areas targeted by this assay, and inadequate number of viral copies(<138 copies/mL). A negative result must be combined with clinical observations, patient history, and epidemiological information. The expected result is Negative.  Fact Sheet for Patients:  EntrepreneurPulse.com.au  Fact Sheet for Healthcare Providers:  IncredibleEmployment.be  This test is no t yet approved or cleared by the [place] FDA and  has been authorized for detection and/or diagnosis of SARS-CoV-2 by FDA under an Emergency Use Authorization (EUA). This EUA will remain  in effect (meaning this test can be used) for the duration of the COVID-19 declaration under Section 564(b)(1) of the Act, 21 U.S.C.section 360bbb-3(b)(1), unless the authorization is terminated  or revoked sooner.       Influenza A by PCR NEGATIVE NEGATIVE Final   Influenza B by PCR NEGATIVE NEGATIVE Final    Comment: (NOTE) The Xpert Xpress SARS-CoV-2/FLU/RSV plus assay is intended as an aid in the diagnosis of influenza from Nasopharyngeal swab specimens and should not be used as a sole basis for treatment. Nasal washings and aspirates are unacceptable for Xpert Xpress SARS-CoV-2/FLU/RSV testing.  Fact Sheet for Patients: EntrepreneurPulse.com.au  Fact Sheet for Healthcare Providers: IncredibleEmployment.be  This test is not yet approved or cleared by the [place] FDA and has been authorized for detection and/or diagnosis of SARS-CoV-2 by FDA under an Emergency Use Authorization  (EUA). This EUA will remain in effect (meaning this test can be used) for the duration of the COVID-19 declaration under Section 564(b)(1) of the Act, 21 U.S.C. section 360bbb-3(b)(1), unless the authorization is terminated or revoked.  Performed at [facility], [place] [address], [place], [place] [address]          Radiology Studies: MR THORACIC SPINE WO CONTRAST  Result Date: 10/09/2020 CLINICAL DATA:  Initial evaluation for acute spine fracture. EXAM: MRI THORACIC AND LUMBAR SPINE WITHOUT CONTRAST TECHNIQUE: Multiplanar and multiecho pulse sequences of the thoracic and lumbar spine were obtained without intravenous contrast. COMPARISON:  Prior CT from 10/08/2020. FINDINGS: MRI THORACIC SPINE FINDINGS Alignment: Trace sigmoid scoliosis. Alignment otherwise normal with preservation of the normal thoracic kyphosis. No listhesis. Vertebrae: Acute biconcave compression fracture seen involving the T9 vertebral body. Associated mild 25% central height loss with trace 3  mm bony retropulsion. Additional acute appearing compression fracture involving the T10 vertebral body with up to 60% height loss and 4 mm bony retropulsion. Associated marrow edema about these fractures extend to involve the posterior elements. Severe compression deformities involving the T12 and L1 vertebral bodies with up to 80-90% height loss and 4 mm bony retropulsion are late subacute to chronic in appearance with only trace residual marrow edema. Additional mild chronic compression fracture involving the T6 vertebral body with up to 30% height loss without bony retropulsion. Vertebral body height otherwise maintained with no other acute or chronic fracture. Underlying bone marrow signal intensity is markedly heterogeneous, nonspecific, but could reflect underlying osteoporosis. No discrete or worrisome osseous lesions. No other abnormal marrow edema. Cord: Small syrinx involving the thoracic spinal cord measures up to 3 mm in  maximal diameter, extending from the visualized upper cord to approximately the T8-9 level. Signal intensity within the cord is otherwise normal. Underlying normal cord caliber and morphology. Paraspinal and other soft tissues: Mild paraspinous edema adjacent to the acute T9 and T10 compression fractures. Paraspinous soft tissues demonstrate no other acute finding. Moderate layering right pleural effusion. Visualized visceral structures otherwise unremarkable. Disc levels: Normal expected for age multilevel disc desiccation seen throughout the thoracic spine. No significant disc bulge or focal disc herniation. Three-4 mm bony retropulsion at the levels of T10, T12, and L1 related to the underlying compression fractures without significant spinal stenosis. No other significant spinal stenosis within the thoracic spine. Foramina remain patent. MRI LUMBAR SPINE FINDINGS Segmentation: Standard. Lowest well-formed disc space labeled the L5-S1 level. Alignment: Mild exaggeration of the normal lumbar lordosis. No listhesis. Vertebrae: Late subacute to chronic compression fractures involving the T12 and L1 vertebral bodies again noted. Vertebral body height otherwise maintained with no other acute, subacute, or chronic compression fracture. Underlying bone marrow signal intensity is markedly heterogeneous without discrete or worrisome osseous lesion. No other abnormal marrow edema. Conus medullaris and cauda equina: Conus extends to the T12-L1 level. Conus and cauda equina appear normal. Paraspinal and other soft tissues: Chronic fatty atrophy noted throughout the posterior paraspinous musculature. Paraspinous soft tissues demonstrate no acute finding. 1.8 cm simple cyst noted within the interpolar left kidney. Additional 9 mm T1 hyperintense lesion at the inferior pole the left kidney noted, nonspecific, but likely a small proteinaceous and/or hemorrhagic cyst. Hepatic cirrhosis partially visualized. Disc levels: L1-2:  Minimal annular disc bulge. Mild facet hypertrophy. No spinal stenosis. Foramina remain patent. L2-3: Disc desiccation with minimal annular disc bulge. Mild facet hypertrophy. No stenosis. L3-4: Minimal annular disc bulge. Mild facet hypertrophy. No canal or foraminal stenosis. L4-5: Disc desiccation with minimal annular disc bulge. Mild facet hypertrophy. No stenosis. L5-S1: Negative interspace. Mild facet hypertrophy. No canal or foraminal stenosis. IMPRESSION: 1. Acute compression fracture involving the T10 vertebral body with up to 60% height loss and 4 mm bony retropulsion. No significant spinal stenosis. 2. Acute compression fracture involving the T9 vertebral body with up to 25% height loss without significant bony retropulsion. 3. Late subacute to chronic severe compression fractures involving the T12 and L1 vertebral bodies with 80-90% height loss and up to 4 mm bony retropulsion. No significant spinal stenosis. 4. Markedly heterogeneous appearance of the visualized bone marrow, nonspecific, but could reflect underlying osteoporosis. 5. Small syrinx measuring up to 3 mm involving the majority of the thoracic spinal cord, terminating at the level of T8-9. 6. Moderate layering right pleural effusion. 7. Hepatic cirrhosis. Electronically Signed   By: Marland Kitchen  Jeannine Boga M.D.   On: 10/09/2020 20:23   MR LUMBAR SPINE WO CONTRAST  Result Date: 10/09/2020 CLINICAL DATA:  Initial evaluation for acute spine fracture. EXAM: MRI THORACIC AND LUMBAR SPINE WITHOUT CONTRAST TECHNIQUE: Multiplanar and multiecho pulse sequences of the thoracic and lumbar spine were obtained without intravenous contrast. COMPARISON:  Prior CT from 10/08/2020. FINDINGS: MRI THORACIC SPINE FINDINGS Alignment: Trace sigmoid scoliosis. Alignment otherwise normal with preservation of the normal thoracic kyphosis. No listhesis. Vertebrae: Acute biconcave compression fracture seen involving the T9 vertebral body. Associated mild 25% central  height loss with trace 3 mm bony retropulsion. Additional acute appearing compression fracture involving the T10 vertebral body with up to 60% height loss and 4 mm bony retropulsion. Associated marrow edema about these fractures extend to involve the posterior elements. Severe compression deformities involving the T12 and L1 vertebral bodies with up to 80-90% height loss and 4 mm bony retropulsion are late subacute to chronic in appearance with only trace residual marrow edema. Additional mild chronic compression fracture involving the T6 vertebral body with up to 30% height loss without bony retropulsion. Vertebral body height otherwise maintained with no other acute or chronic fracture. Underlying bone marrow signal intensity is markedly heterogeneous, nonspecific, but could reflect underlying osteoporosis. No discrete or worrisome osseous lesions. No other abnormal marrow edema. Cord: Small syrinx involving the thoracic spinal cord measures up to 3 mm in maximal diameter, extending from the visualized upper cord to approximately the T8-9 level. Signal intensity within the cord is otherwise normal. Underlying normal cord caliber and morphology. Paraspinal and other soft tissues: Mild paraspinous edema adjacent to the acute T9 and T10 compression fractures. Paraspinous soft tissues demonstrate no other acute finding. Moderate layering right pleural effusion. Visualized visceral structures otherwise unremarkable. Disc levels: Normal expected for age multilevel disc desiccation seen throughout the thoracic spine. No significant disc bulge or focal disc herniation. Three-4 mm bony retropulsion at the levels of T10, T12, and L1 related to the underlying compression fractures without significant spinal stenosis. No other significant spinal stenosis within the thoracic spine. Foramina remain patent. MRI LUMBAR SPINE FINDINGS Segmentation: Standard. Lowest well-formed disc space labeled the L5-S1 level. Alignment: Mild  exaggeration of the normal lumbar lordosis. No listhesis. Vertebrae: Late subacute to chronic compression fractures involving the T12 and L1 vertebral bodies again noted. Vertebral body height otherwise maintained with no other acute, subacute, or chronic compression fracture. Underlying bone marrow signal intensity is markedly heterogeneous without discrete or worrisome osseous lesion. No other abnormal marrow edema. Conus medullaris and cauda equina: Conus extends to the T12-L1 level. Conus and cauda equina appear normal. Paraspinal and other soft tissues: Chronic fatty atrophy noted throughout the posterior paraspinous musculature. Paraspinous soft tissues demonstrate no acute finding. 1.8 cm simple cyst noted within the interpolar left kidney. Additional 9 mm T1 hyperintense lesion at the inferior pole the left kidney noted, nonspecific, but likely a small proteinaceous and/or hemorrhagic cyst. Hepatic cirrhosis partially visualized. Disc levels: L1-2: Minimal annular disc bulge. Mild facet hypertrophy. No spinal stenosis. Foramina remain patent. L2-3: Disc desiccation with minimal annular disc bulge. Mild facet hypertrophy. No stenosis. L3-4: Minimal annular disc bulge. Mild facet hypertrophy. No canal or foraminal stenosis. L4-5: Disc desiccation with minimal annular disc bulge. Mild facet hypertrophy. No stenosis. L5-S1: Negative interspace. Mild facet hypertrophy. No canal or foraminal stenosis. IMPRESSION: 1. Acute compression fracture involving the T10 vertebral body with up to 60% height loss and 4 mm bony retropulsion. No significant spinal stenosis. 2. Acute  compression fracture involving the T9 vertebral body with up to 25% height loss without significant bony retropulsion. 3. Late subacute to chronic severe compression fractures involving the T12 and L1 vertebral bodies with 80-90% height loss and up to 4 mm bony retropulsion. No significant spinal stenosis. 4. Markedly heterogeneous appearance of the  visualized bone marrow, nonspecific, but could reflect underlying osteoporosis. 5. Small syrinx measuring up to 3 mm involving the majority of the thoracic spinal cord, terminating at the level of T8-9. 6. Moderate layering right pleural effusion. 7. Hepatic cirrhosis. Electronically Signed   By: Jeannine Boga M.D.   On: 10/09/2020 20:23   ECHOCARDIOGRAM COMPLETE  Result Date: 10/10/2020    ECHOCARDIOGRAM REPORT   Patient Name:   [name] Date of Exam: 10/10/2020 Medical Rec #:  [id number]         Height:       64.0 in Accession #:    [id number]        Weight:       179.5 lb Date of Birth:  06-23-1951         BSA:          1.868 m Patient Age:    10 years          BP:           174/92 mmHg Patient Gender: F                 HR:           72 bpm. Exam Location:  Inpatient Procedure: 2D Echo, Cardiac Doppler and Color Doppler Indications:    Cardiomyopathy-Unspecified I42.9  History:        Patient has prior history of Echocardiogram examinations, most                 recent 06/14/2015. Risk Factors:Hypertension.  Sonographer:    Bernadene Person RDCS Referring Phys: [id number] Cicley Ganesh CHIRAG Camil Wilhelmsen IMPRESSIONS  1. Left ventricular ejection fraction, by estimation, is 55 to 60%. The left ventricle has normal function. The left ventricle has no regional wall motion abnormalities. There is moderate asymmetric hypertrophy of the basal septal segment. The rest of the LV segments demonstrate mild left ventricular hypertrophy. Left ventricular diastolic parameters are consistent with Grade I diastolic dysfunction (impaired relaxation). Elevated left atrial pressure.  2. Right ventricular systolic function is normal. The right ventricular size is mildly enlarged. There is severely elevated pulmonary artery systolic pressure. The estimated right ventricular systolic pressure is 29.7 mmHg.  3. Left atrial size was severely dilated.  4. Right atrial size was mildly dilated.  5. The mitral valve is abnormal. There are  mildly elevated gradients with flow acceleration noted across the mitral valve with mean gradient 68mmHg at HR 77bpm. MVA by continuity 2.7cm2 with no significant mitral stenosis. Mild mitral valve regurgitation.  6. The aortic valve is tricuspid. There is mild calcification of the aortic valve. There is moderate thickening of the aortic valve. The [place] appears fixed with trivial AR. Mild to moderate aortic valve sclerosis/calcification is present, without any evidence of aortic stenosis.  7. The inferior vena cava is normal in size with greater than 50% respiratory variability, suggesting right atrial pressure of 3 mmHg. Comparison(s): Compared to prior TTE in 2016, there is now severe pulmonary HTN present with PASP 49mmHg (previously 61mmHg). FINDINGS  Left Ventricle: Left ventricular ejection fraction, by estimation, is 55 to 60%. The left ventricle has normal function. The left ventricle has no regional  wall motion abnormalities. The left ventricular internal cavity size was normal in size. There is  moderate asymmetric hypertrophy of the basal septal segment. The rest of the LV segments demonstrate mild left ventricular hypertrophy. Left ventricular diastolic parameters are consistent with Grade I diastolic dysfunction (impaired relaxation). Elevated left atrial pressure. Right Ventricle: The right ventricular size is mildly enlarged. No increase in right ventricular wall thickness. Right ventricular systolic function is normal. There is severely elevated pulmonary artery systolic pressure. The tricuspid regurgitant velocity is 3.80 m/s, and with an assumed right atrial pressure of 3 mmHg, the estimated right ventricular systolic pressure is 34.1 mmHg. Left Atrium: Left atrial size was severely dilated. Right Atrium: Right atrial size was mildly dilated. Pericardium: There is no evidence of pericardial effusion. Mitral Valve: There are mildly elevated gradients with flow acceleration noted across the mitral  valve with mean gradient 57mmHg at HR 77bpm. MVA by continuity 2.7cm2 with no significant stenosis. The mitral valve is abnormal. There is moderate thickening  of the mitral valve leaflet(s). There is moderate calcification of the mitral valve leaflet(s). Moderate mitral annular calcification. Mild mitral valve regurgitation. No evidence of mitral valve stenosis. Tricuspid Valve: The tricuspid valve is normal in structure. Tricuspid valve regurgitation is mild. Aortic Valve: The aortic valve is tricuspid. There is mild calcification of the aortic valve. There is moderate thickening of the aortic valve. Aortic valve regurgitation LCC is fixed with trivial AR. Mild to moderate aortic valve sclerosis/calcification  is present, without any evidence of aortic stenosis. Pulmonic Valve: The pulmonic valve was normal in structure. Pulmonic valve regurgitation is trivial. Aorta: The aortic root and ascending aorta are structurally normal, with no evidence of dilitation. Venous: The inferior vena cava is normal in size with greater than 50% respiratory variability, suggesting right atrial pressure of 3 mmHg. IAS/Shunts: No atrial level shunt detected by color flow Doppler.  LEFT VENTRICLE PLAX 2D LVIDd:         4.50 cm  Diastology LVIDs:         3.00 cm  LV e' medial:    5.33 cm/s LV PW:         1.10 cm  LV E/e' medial:  22.7 LV IVS:        1.10 cm  LV e' lateral:   7.80 cm/s LVOT diam:     2.10 cm  LV E/e' lateral: 15.5 LV SV:         103 LV SV Index:   55 LVOT Area:     3.46 cm  RIGHT VENTRICLE RV S prime:     17.40 cm/s TAPSE (M-mode): 2.6 cm LEFT ATRIUM             Index       RIGHT ATRIUM           Index LA diam:        5.60 cm 3.00 cm/m  RA Area:     19.20 cm LA Vol (A2C):   86.0 ml 46.03 ml/m RA Volume:   54.70 ml  29.28 ml/m LA Vol (A4C):   90.6 ml 48.49 ml/m LA Biplane Vol: 88.2 ml 47.21 ml/m  AORTIC VALVE LVOT Vmax:   119.00 cm/s LVOT Vmean:  78.800 cm/s LVOT VTI:    0.296 m  AORTA Ao Root diam: 3.00 cm Ao Asc  diam:  3.30 cm MITRAL VALVE                TRICUSPID VALVE MV Area (PHT): 5.66  cm     TR Peak grad:   57.8 mmHg MV Decel Time: 134 msec     TR Vmax:        380.00 cm/s MV E velocity: 121.00 cm/s MV A velocity: 144.00 cm/s  SHUNTS MV E/A ratio:  0.84         Systemic VTI:  0.30 m                             Systemic Diam: 2.10 cm Gwyndolyn Kaufman MD Electronically signed by Gwyndolyn Kaufman MD Signature Date/Time: 10/10/2020/12:13:50 PM    Final    US Abdomen Limited RUQ (LIVER/GB)  Result Date: 10/09/2020 CLINICAL DATA:  Transaminitis EXAM: ULTRASOUND ABDOMEN LIMITED RIGHT UPPER QUADRANT COMPARISON:  CT abdomen pelvis 10/08/2020 FINDINGS: Gallbladder: No gallstones or wall thickening visualized. No sonographic Murphy sign noted by sonographer. Common bile duct: Diameter: 8 mm Liver: Nodular hepatic contour suspicious for cirrhosis. No focal hepatic lesion. Portal vein is patent on color Doppler imaging with normal direction of blood flow towards the liver. Other: None. IMPRESSION: Cirrhotic liver morphology without focal hepatic lesion. Electronically Signed   By: Miachel Roux M.D.   On: 10/09/2020 09:06        Scheduled Meds: . cholecalciferol  1,000 Units Oral Daily  . citalopram  20 mg Oral Daily  . docusate sodium  100 mg Oral Daily  . feeding supplement  237 mL Oral BID BM  . hydrALAZINE  10 mg Oral TID  . levothyroxine  100 mcg Oral Q0600  . lisinopril  40 mg Oral Daily  . omega-3 acid ethyl esters  1 g Oral Daily  . pantoprazole  40 mg Oral QHS  . predniSONE  40 mg Oral Q breakfast   Continuous Infusions: . sodium chloride 75 mL/hr at 10/10/20 2141  .  ceFAZolin (ANCEF) IV       LOS: 3 days   Time spent= 35 mins    [place] Arsenio Loader, MD [facility]  If 7PM-7AM, please contact night-coverage  10/11/2020, 7:39 AM

## 2020-10-11 NOTE — Progress Notes (Signed)
   10/11/20 0904  Assess: MEWS Score  Level of Consciousness Alert  Assess: MEWS Score  MEWS Temp 0  MEWS Systolic 0  MEWS Pulse 2  MEWS RR 0  MEWS LOC 0  MEWS Score 2  MEWS Score Color Yellow  Assess: if the MEWS score is Yellow or Red  Were vital signs taken at a resting state? Yes  Focused Assessment Change from prior assessment (see assessment flowsheet)  Early Detection of Sepsis Score *See Row Information* Low  MEWS guidelines implemented *See Row Information* Yes  Treat  MEWS Interventions Administered scheduled meds/treatments  Pain Scale 0-10  Pain Score 0  Escalate  MEWS: Escalate Yellow: discuss with charge nurse/RN and consider discussing with provider and RRT  Notify: Charge Nurse/RN  Name of Charge Nurse/RN Notified [facility]  Date Charge Nurse/RN Notified 10/11/20  Time Charge Nurse/RN Notified 0900

## 2020-10-12 ENCOUNTER — Inpatient Hospital Stay (HOSPITAL_COMMUNITY): Payer: Medicare Other

## 2020-10-12 ENCOUNTER — Encounter (HOSPITAL_COMMUNITY): Payer: Self-pay | Admitting: Internal Medicine

## 2020-10-12 DIAGNOSIS — I4891 Unspecified atrial fibrillation: Secondary | ICD-10-CM

## 2020-10-12 DIAGNOSIS — I480 Paroxysmal atrial fibrillation: Secondary | ICD-10-CM

## 2020-10-12 LAB — GLUCOSE, CAPILLARY
Glucose-Capillary: 136 mg/dL — ABNORMAL HIGH (ref 70–99)
Glucose-Capillary: 137 mg/dL — ABNORMAL HIGH (ref 70–99)
Glucose-Capillary: 142 mg/dL — ABNORMAL HIGH (ref 70–99)
Glucose-Capillary: 145 mg/dL — ABNORMAL HIGH (ref 70–99)
Glucose-Capillary: 148 mg/dL — ABNORMAL HIGH (ref 70–99)

## 2020-10-12 LAB — CBC WITH DIFFERENTIAL/PLATELET
Abs Immature Granulocytes: 0.08 10*3/uL — ABNORMAL HIGH (ref 0.00–0.07)
Basophils Absolute: 0 10*3/uL (ref 0.0–0.1)
Basophils Relative: 0 %
Eosinophils Absolute: 0 10*3/uL (ref 0.0–0.5)
Eosinophils Relative: 0 %
HCT: 33.4 % — ABNORMAL LOW (ref 36.0–46.0)
Hemoglobin: 12.3 g/dL (ref 12.0–15.0)
Immature Granulocytes: 1 %
Lymphocytes Relative: 3 %
Lymphs Abs: 0.3 10*3/uL — ABNORMAL LOW (ref 0.7–4.0)
MCH: 33 pg (ref 26.0–34.0)
MCHC: 36.8 g/dL — ABNORMAL HIGH (ref 30.0–36.0)
MCV: 89.5 fL (ref 80.0–100.0)
Monocytes Absolute: 0.2 10*3/uL (ref 0.1–1.0)
Monocytes Relative: 3 %
Neutro Abs: 8.2 10*3/uL — ABNORMAL HIGH (ref 1.7–7.7)
Neutrophils Relative %: 93 %
Platelets: 234 10*3/uL (ref 150–400)
RBC: 3.73 MIL/uL — ABNORMAL LOW (ref 3.87–5.11)
RDW: 12.9 % (ref 11.5–15.5)
WBC: 8.8 10*3/uL (ref 4.0–10.5)
nRBC: 0 % (ref 0.0–0.2)

## 2020-10-12 LAB — COMPREHENSIVE METABOLIC PANEL
ALT: 14 U/L (ref 0–44)
ALT: 14 U/L (ref 0–44)
AST: 21 U/L (ref 15–41)
AST: 22 U/L (ref 15–41)
Albumin: 3.3 g/dL — ABNORMAL LOW (ref 3.5–5.0)
Albumin: 3.4 g/dL — ABNORMAL LOW (ref 3.5–5.0)
Alkaline Phosphatase: 97 U/L (ref 38–126)
Alkaline Phosphatase: 98 U/L (ref 38–126)
Anion gap: 9 (ref 5–15)
Anion gap: 8 (ref 5–15)
BUN: 11 mg/dL (ref 8–23)
BUN: 11 mg/dL (ref 8–23)
CO2: 34 mmol/L — ABNORMAL HIGH (ref 22–32)
CO2: 35 mmol/L — ABNORMAL HIGH (ref 22–32)
Calcium: 9.1 mg/dL (ref 8.9–10.3)
Calcium: 9.4 mg/dL (ref 8.9–10.3)
Chloride: 86 mmol/L — ABNORMAL LOW (ref 98–111)
Chloride: 88 mmol/L — ABNORMAL LOW (ref 98–111)
Creatinine, Ser: 0.49 mg/dL (ref 0.44–1.00)
Creatinine, Ser: 0.52 mg/dL (ref 0.44–1.00)
GFR, Estimated: >60 mL/min (ref >60)
GFR, Estimated: >60 mL/min (ref >60)
Glucose, Bld: 135 mg/dL — ABNORMAL HIGH (ref 70–99)
Glucose, Bld: 140 mg/dL — ABNORMAL HIGH (ref 70–99)
Potassium: 4 mmol/L (ref 3.5–5.1)
Potassium: 4 mmol/L (ref 3.5–5.1)
Sodium: 129 mmol/L — ABNORMAL LOW (ref 135–145)
Sodium: 131 mmol/L — ABNORMAL LOW (ref 135–145)
Total Bilirubin: 0.6 mg/dL (ref 0.3–1.2)
Total Bilirubin: 1 mg/dL (ref 0.3–1.2)
Total Protein: 6 g/dL — ABNORMAL LOW (ref 6.5–8.1)
Total Protein: 6.1 g/dL — ABNORMAL LOW (ref 6.5–8.1)

## 2020-10-12 LAB — BLOOD GAS, ARTERIAL
Acid-Base Excess: 11.3 mmol/L — ABNORMAL HIGH (ref 0.0–2.0)
Bicarbonate: 35.5 mmol/L — ABNORMAL HIGH (ref 20.0–28.0)
FIO2: 28
O2 Saturation: 99 %
Patient temperature: 36.6
pCO2 arterial: 46.9 mmHg (ref 32.0–48.0)
pH, Arterial: 7.489 — ABNORMAL HIGH (ref 7.350–7.450)
pO2, Arterial: 135 mmHg — ABNORMAL HIGH (ref 83.0–108.0)

## 2020-10-12 LAB — FOLATE: Folate: 9 ng/mL (ref >5.9)

## 2020-10-12 LAB — MAGNESIUM: Magnesium: 1.4 mg/dL — ABNORMAL LOW (ref 1.7–2.4)

## 2020-10-12 LAB — VITAMIN B12: Vitamin B-12: 660 pg/mL (ref 180–914)

## 2020-10-12 LAB — AMMONIA
Ammonia: 39 umol/L — ABNORMAL HIGH (ref 9–35)
Ammonia: 25 umol/L (ref 9–35)

## 2020-10-12 LAB — BRAIN NATRIURETIC PEPTIDE
B Natriuretic Peptide: 949.4 pg/mL — ABNORMAL HIGH (ref 0.0–100.0)
B Natriuretic Peptide: 902.7 pg/mL — ABNORMAL HIGH (ref 0.0–100.0)

## 2020-10-12 LAB — HEMOGLOBIN A1C
Hgb A1c MFr Bld: 4.7 % — ABNORMAL LOW (ref 4.8–5.6)
Mean Plasma Glucose: 88.19 mg/dL

## 2020-10-12 LAB — TSH: TSH: 0.241 u[IU]/mL — ABNORMAL LOW (ref 0.350–4.500)

## 2020-10-12 IMAGING — DX DG CHEST 1V PORT
1 series · 1 of 1 positions shown · non-contrast
Comparison: [DATE]

CLINICAL DATA: Shortness of breath.

EXAM:
PORTABLE CHEST 1 VIEW

[chest ap]
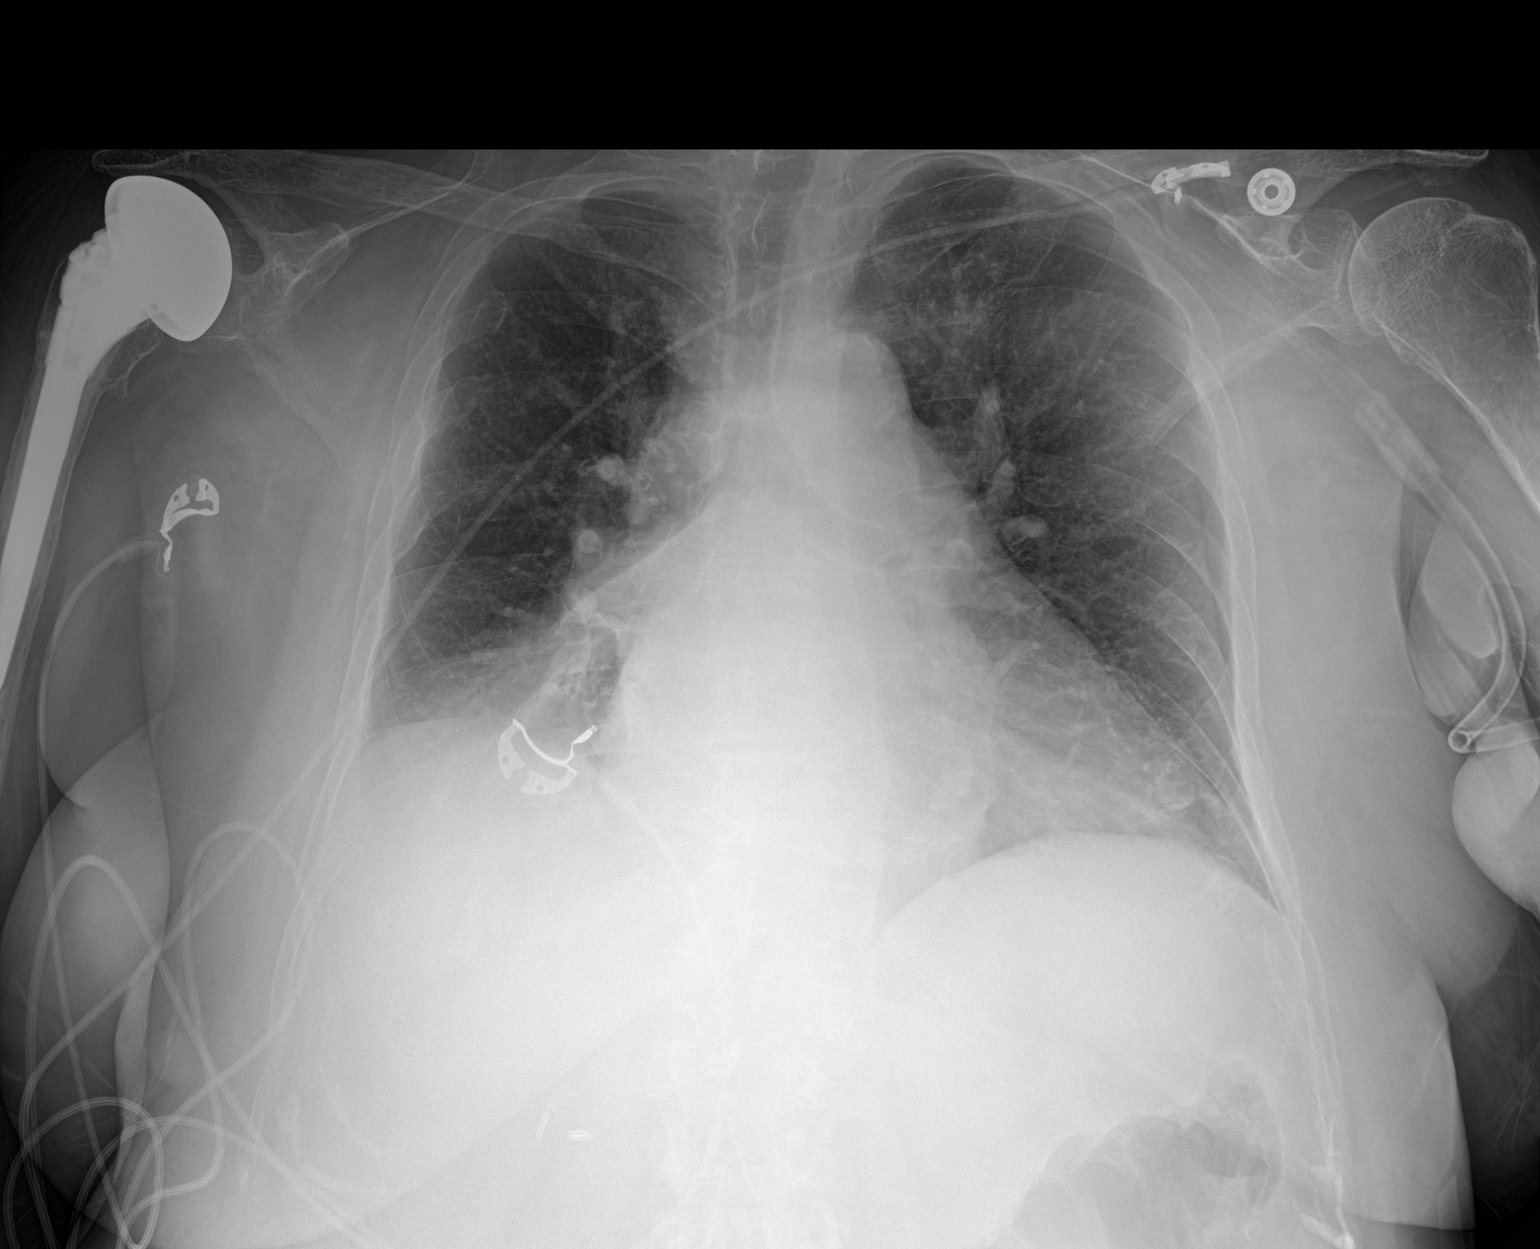

[1 of 1 positions shown; findings below may reference images not displayed]

FINDINGS: Mild cardiomegaly noted as well as pulmonary vascular congestion.
Infiltrate or atelectasis is seen at the right lung base, and small
right pleural effusion cannot be excluded. Left lung is clear.
Multiple old right rib fracture deformities are noted, as well as
right shoulder prosthesis.
IMPRESSION: Right basilar atelectasis versus infiltrate, and possible small
right pleural effusion.

Mild cardiomegaly and pulmonary vascular congestion.

## 2020-10-12 IMAGING — XA IR VERTEBROPLASTY ADDL INJECTION
1 series · 14 of 24 positions shown · IV contrast (IODINE)
Comparison: Recent MRI of the thoracolumbar spine [DATE].

INDICATION: Severe midthoracic pain secondary to compression fractures at T9 and
T10.

EXAM:
VERTEBROPLASTIES AT T9 AND T10
TECHNIQUE: Informed written consent was obtained from the patient after a
thorough discussion of the procedural risks, benefits and
alternatives. All questions were addressed. Maximal Sterile Barrier
Technique was utilized including caps, mask, sterile gowns, sterile
gloves, sterile drape, hand hygiene and skin antiseptic. A timeout
was performed prior to the initiation of the procedure.

[Series 300: spine · 14 of 58 slices shown]
[im 1/58]
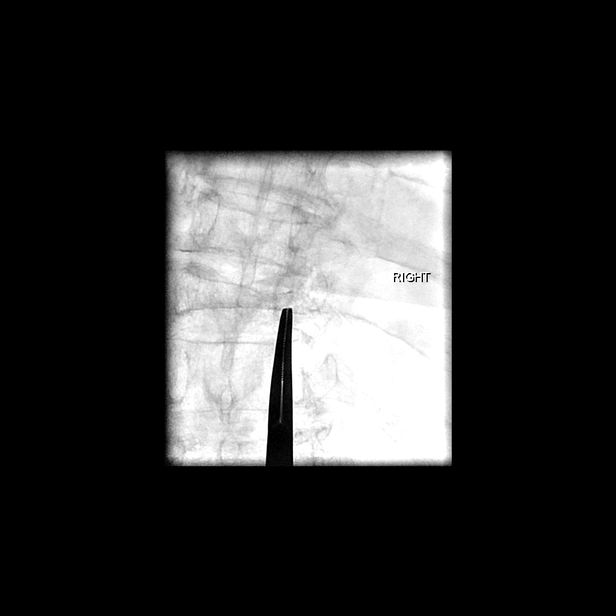
[im 5/58]
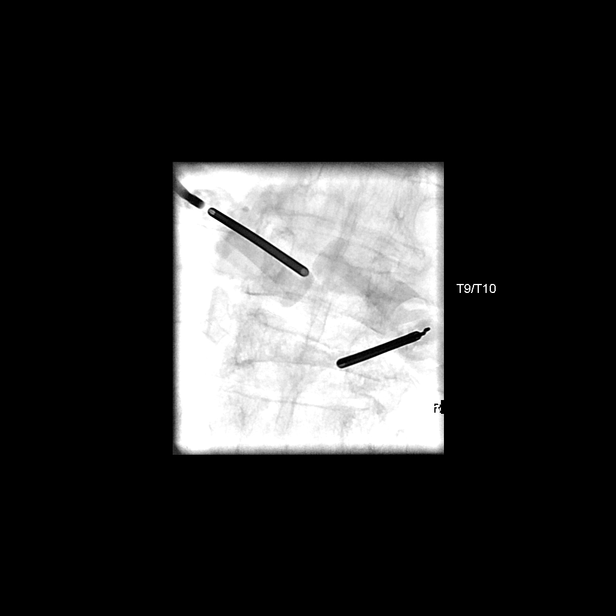
[im 10/58]
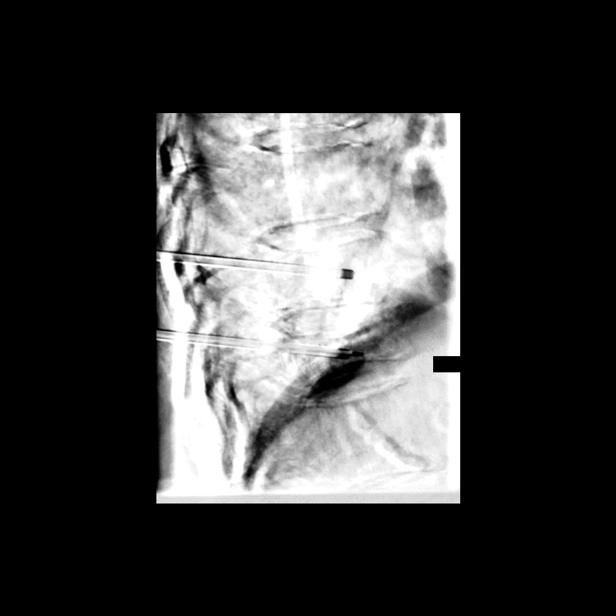
[im 15/58]
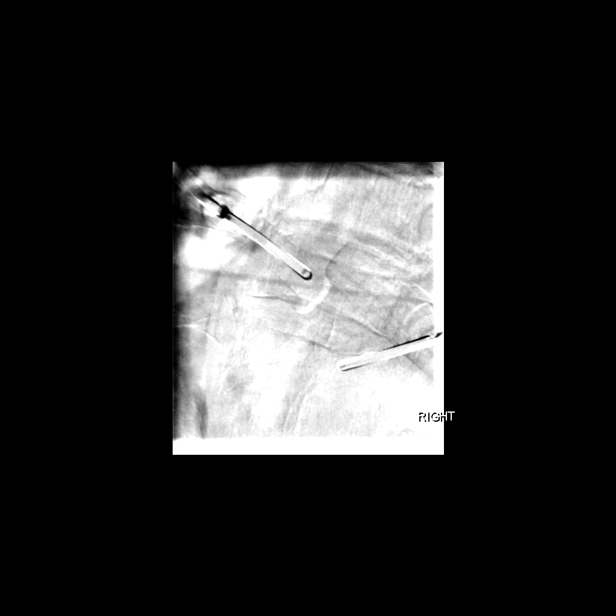
[im 18/58]
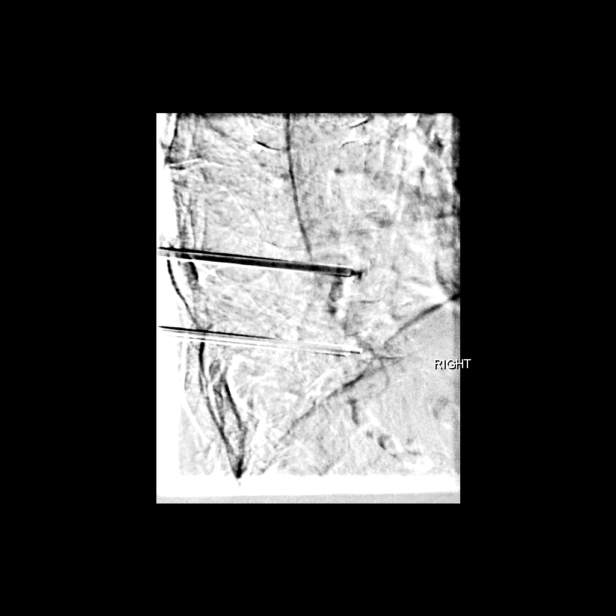
[im 23/58]
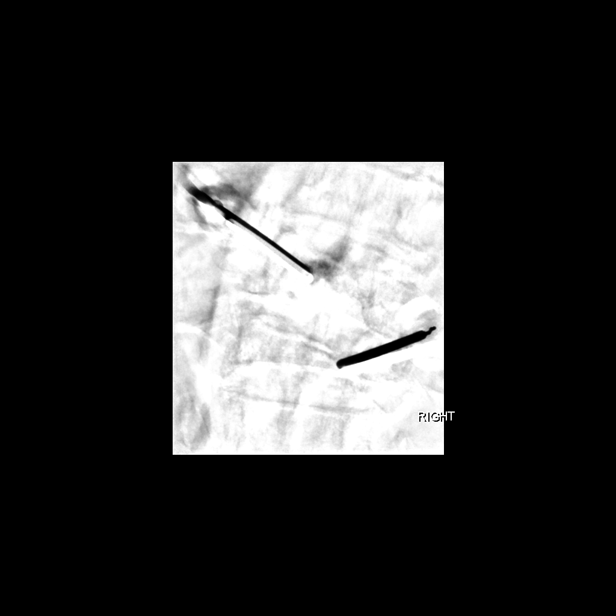
[im 28/58]
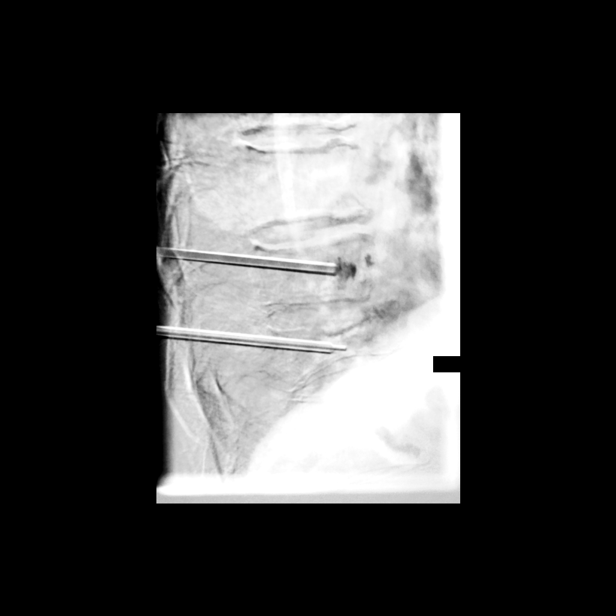
[im 30/58]
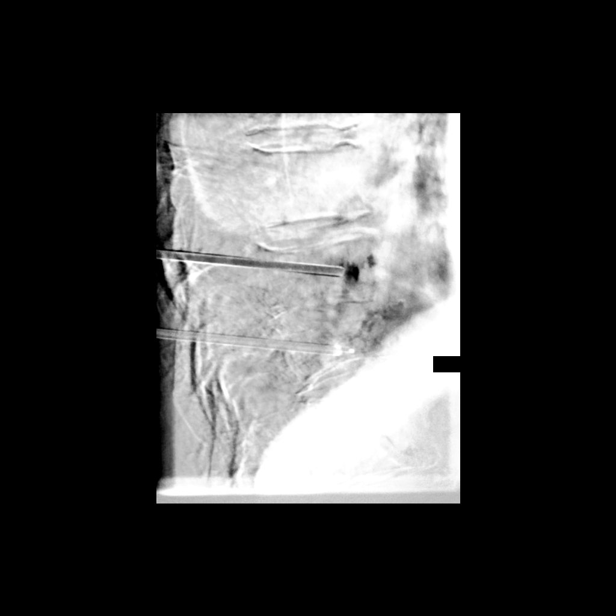
[im 35/58]
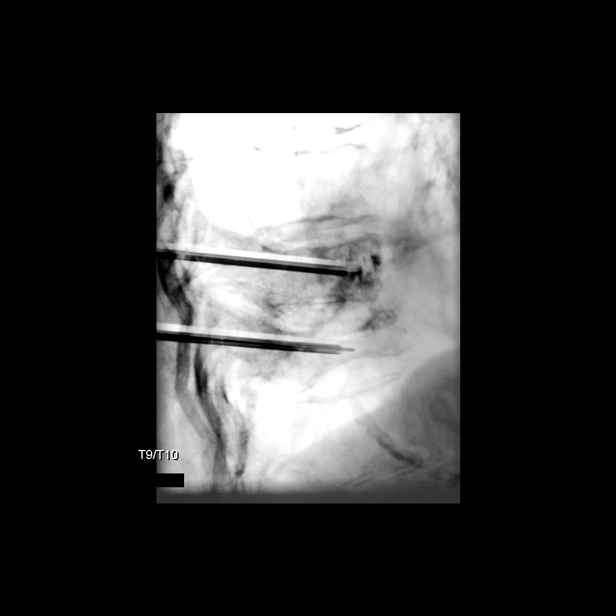
[im 40/58]
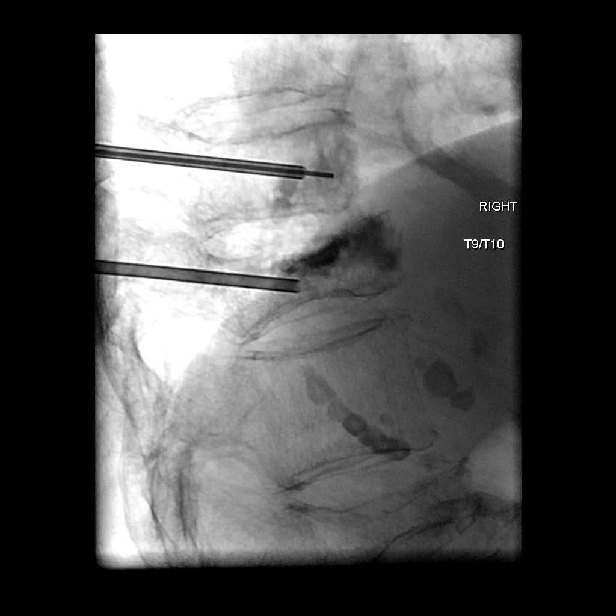
[im 45/58]
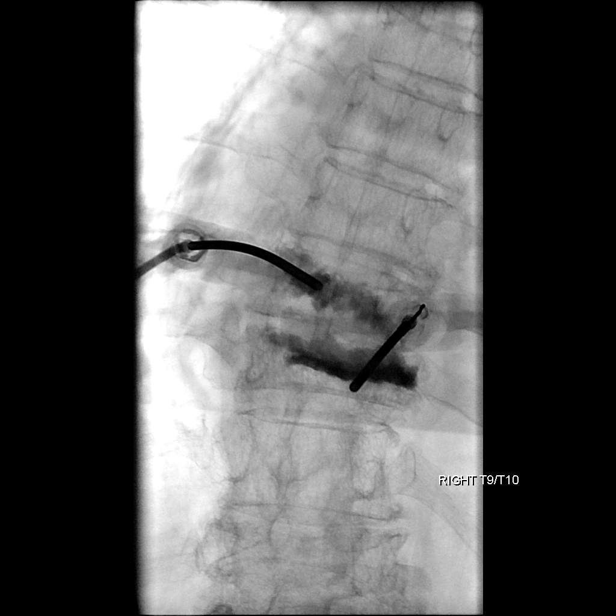
[im 48/58]
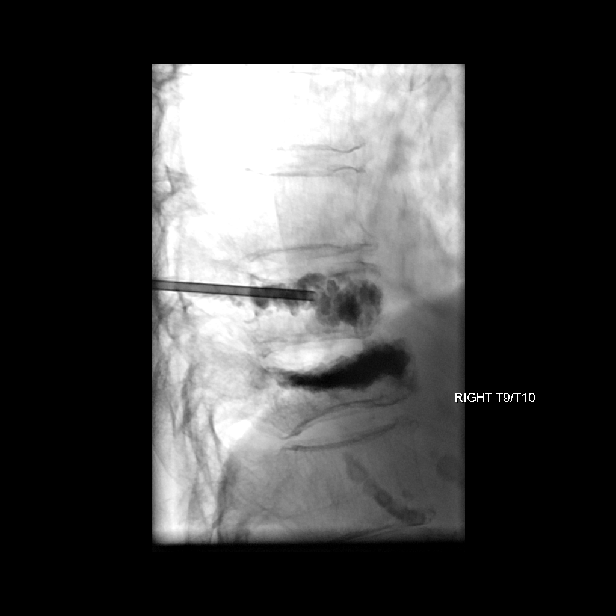
[im 53/58]
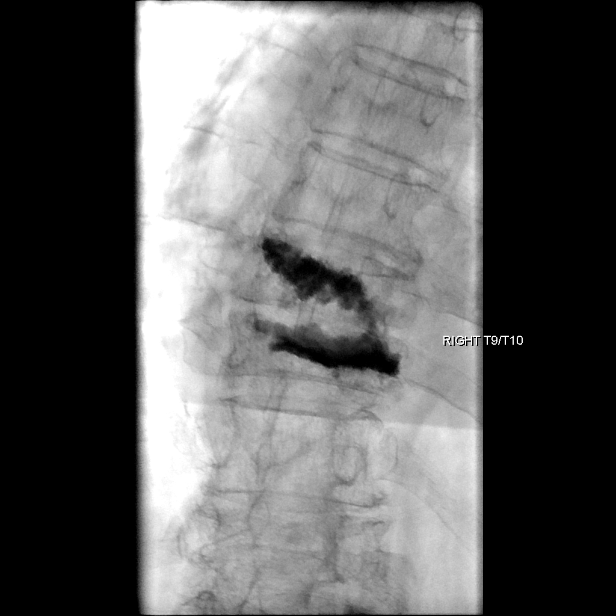
[im 58/58]
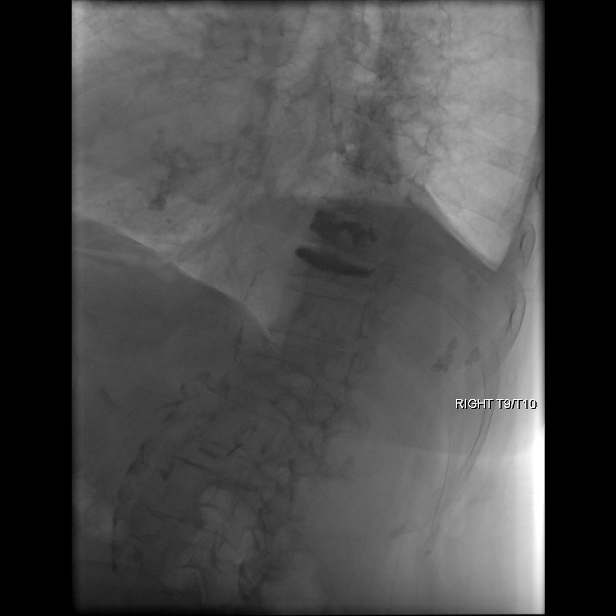

[14 of 24 positions shown; findings below may reference images not displayed]

MEDICATIONS:
As antibiotic prophylaxis, Ancef 2 g IV was ordered pre-procedure
and administered intravenously within 1 hour of incision.

ANESTHESIA/SEDATION:
Moderate (conscious) sedation was employed during this procedure. A
total of Versed 1 mg and Fentanyl 37.5 mcg was administered
intravenously.

Moderate Sedation Time: 40 minutes. The patient's level of
consciousness and vital signs were monitored continuously by
radiology nursing throughout the procedure under my direct
supervision.

FLUOROSCOPY TIME:  Fluoroscopy Time: 14 minutes 6 seconds ([N3] mGy)

COMPLICATIONS:
None immediate.
PROCEDURE:
The patient was placed prone on the fluoroscopic table. Nasal oxygen
was administered. Physiologic monitoring was performed throughout
the duration of the procedure. The skin overlying the thoracic
region was prepped and draped in the usual sterile fashion. The T9
and T10 vertebral bodies were identified and the right pedicle at
T10 and the left pedicle at T9 were infiltrated with 0.25%
Bupivacaine. This was then followed by the advancement of a 13-gauge
Cook needle through the pedicles into the anterior one-third at both
levels. A gentle contrast injection demonstrated a trabecular
pattern of contrast both levels, with early opacification of a
paraspinous vein at the level of T9. This prompted the use of
Gelfoam pledgets into both the SAXMANPR spinal needles prior to the
delivery of the methylmethacrylate mixture.

At this time, methylmethacrylate mixture was reconstituted. Under
biplane intermittent fluoroscopy, the methylmethacrylate was then
injected into the T9 and T10 vertebral bodies with filling of the
vertebral bodies.

No extravasation was noted into the disk spaces or posteriorly into
the spinal canal. No epidural venous contamination was seen.

The needles were then removed. Hemostasis was achieved at the skin
entry sites.

There were no acute complications. Patient tolerated the procedure
well. The patient was returned to the floor in good condition.
IMPRESSION: 1. Status post vertebral body augmentation for painful compression
fracture at T9, and T10 using vertebroplasty technique as described
without event.

## 2020-10-12 IMAGING — CT CT ANGIO NECK
2 of 6 series · 10 of 33 positions shown · IV contrast (OMNI 350)
Comparison: CT head [DATE]

CLINICAL DATA: Delirium.  Cerebral aneurysm.

EXAM:
CT ANGIOGRAPHY HEAD AND NECK
TECHNIQUE: Multidetector CT imaging of the head and neck was performed using
the standard protocol during bolus administration of intravenous
contrast. Multiplanar CT image reconstructions and MIPs were
obtained to evaluate the vascular anatomy. Carotid stenosis
measurements (when applicable) are obtained utilizing NASCET
criteria, using the distal internal carotid diameter as the
denominator.
CONTRAST:  75mL OMNIPAQUE IOHEXOL 350 MG/ML SOLN

[Series 6: cta neck · axial · 0.44mm/px · z∈[-258,-20]mm · 8 of 154 slices shown]
[im 18/154  soft-tissue]
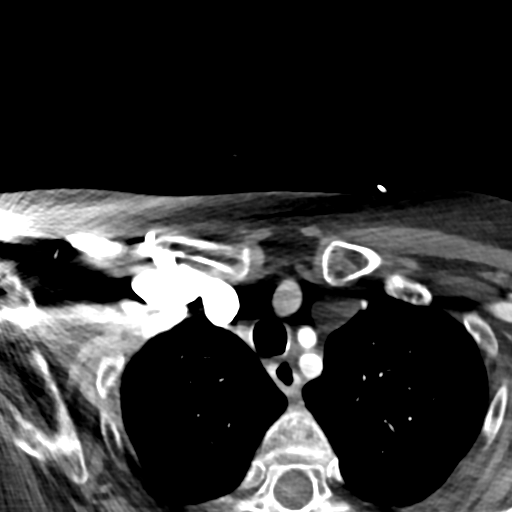
[im 35/154  bone]
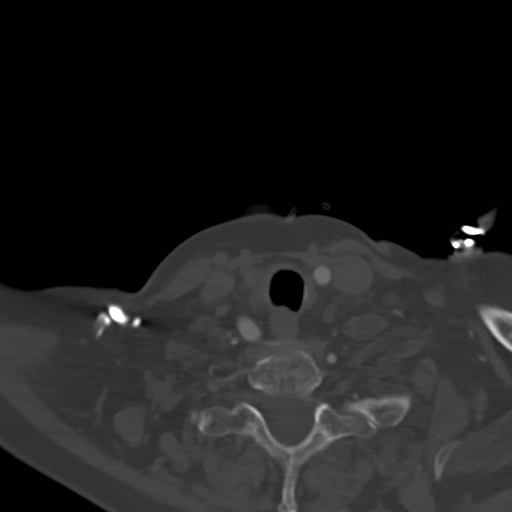
[im 52/154  soft-tissue]
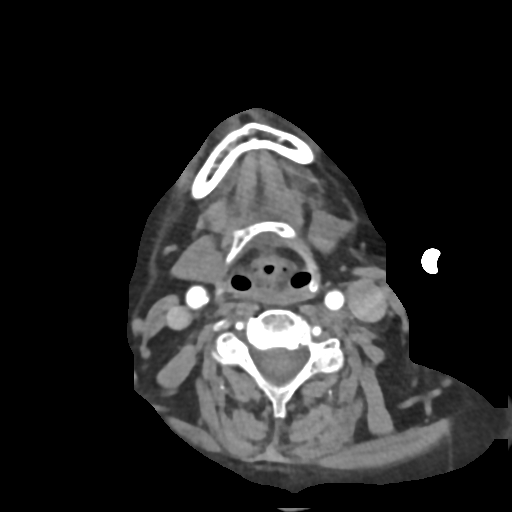
[im 69/154  bone]
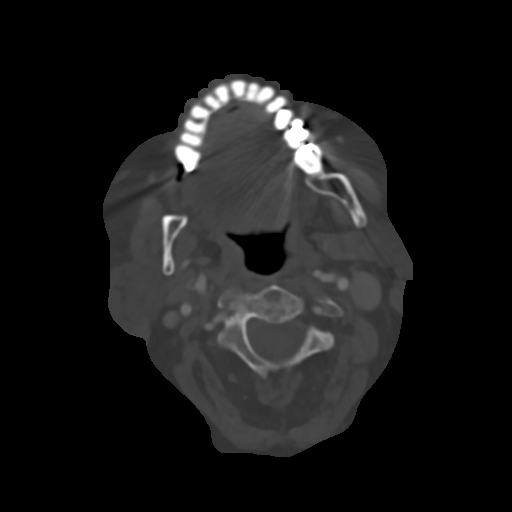
[im 86/154  soft-tissue]
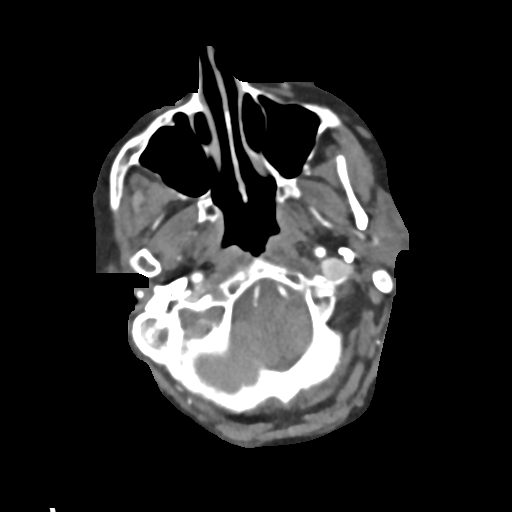
[im 103/154  bone]
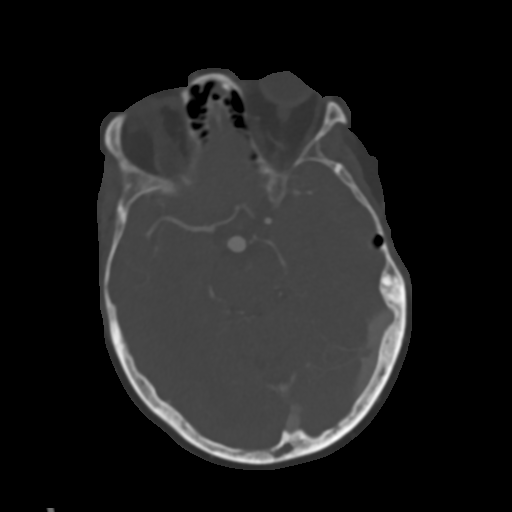
[im 120/154  soft-tissue]
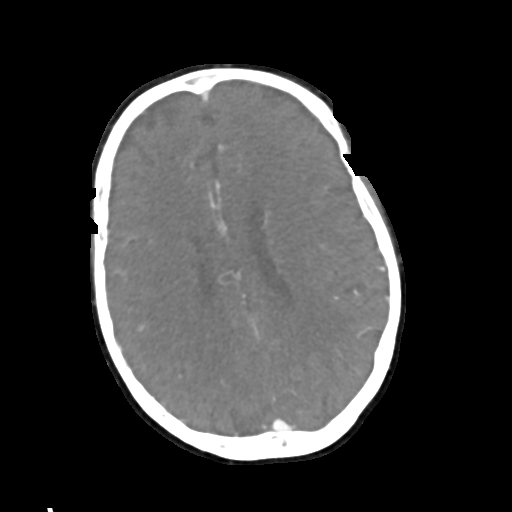
[im 137/154  bone]
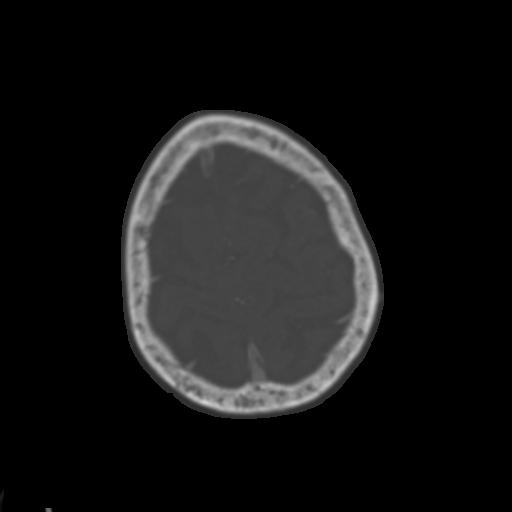

[Series 11: cta neck axial thick · axial · 0.39mm/px · z∈[-199,-100]mm · 2 of 62 slices shown]
[im 21/62  soft-tissue]
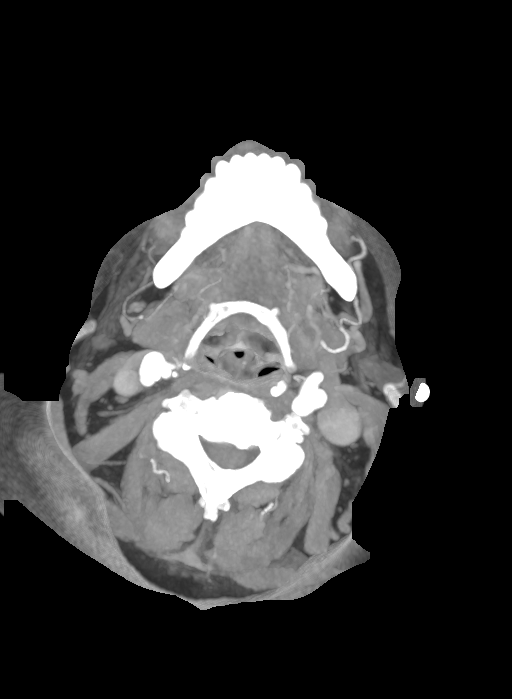
[im 41/62  soft-tissue]
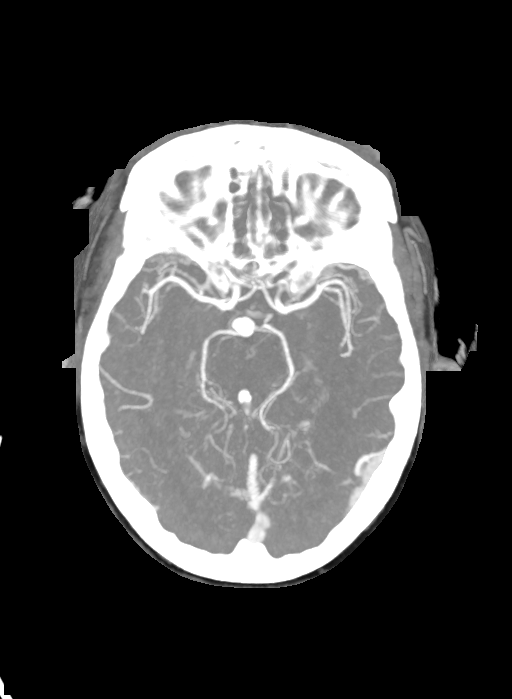

[10 of 33 positions shown; findings below may reference images not displayed]

FINDINGS: CTA NECK FINDINGS

Aortic arch: Standard branching. Imaged portion shows no evidence of
aneurysm or dissection. No significant stenosis of the major arch
vessel origins. Mild atherosclerotic disease aortic arch and
proximal great vessels.

Right carotid system: Mild atherosclerotic disease right carotid
bifurcation. Negative for stenosis.

Left carotid system: Mild atherosclerotic disease left carotid
bifurcation. Negative for stenosis.

Vertebral arteries: Both vertebral arteries patent to the basilar.
Mild calcific stenosis distal left vertebral artery at the skull
base.

Skeleton: Mild degenerative change cervical spine. No acute skeletal
abnormality.

Other neck: Negative

Upper chest: Lung apices clear bilaterally.

Review of the MIP images confirms the above findings

CTA HEAD FINDINGS

Anterior circulation: Cavernous carotid widely patent bilaterally.
Anterior middle cerebral arteries normal bilaterally. No stenosis or
aneurysm in the anterior circulation.

Posterior circulation: Both vertebral arteries patent to the
basilar. Mild stenosis distal left vertebral artery at the skull
base. Right PICA patent. Left PICA not visualized. Basilar widely
patent. AICA, superior cerebellar, and posterior cerebral arteries
widely patent without stenosis.

Aneurysm of the terminal basilar projecting superiorly. Mildly
lobulated or margins. Aneurysm measures approximately 8 x 9 mm in
diameter. No evidence of rupture or thrombus.

Venous sinuses: Normal venous enhancement

Anatomic variants: None

Review of the MIP images confirms the above findings
IMPRESSION: 1. 8 x 9 mm aneurysm of the terminal basilar without evidence of
rupture. No other aneurysm
2. No intracranial stenosis or large vessel occlusion
3. No significant carotid or vertebral artery stenosis.

## 2020-10-12 IMAGING — CT CT ANGIO HEAD
1 of 8 series · 6 of 33 positions shown · IV contrast (OMNI 350)
Comparison: CT head [DATE]

CLINICAL DATA: Delirium.  Cerebral aneurysm.

EXAM:
CT ANGIOGRAPHY HEAD AND NECK
TECHNIQUE: Multidetector CT imaging of the head and neck was performed using
the standard protocol during bolus administration of intravenous
contrast. Multiplanar CT image reconstructions and MIPs were
obtained to evaluate the vascular anatomy. Carotid stenosis
measurements (when applicable) are obtained utilizing NASCET
criteria, using the distal internal carotid diameter as the
denominator.
CONTRAST:  75mL OMNIPAQUE IOHEXOL 350 MG/ML SOLN

[Series 7: cta neck thins · axial · 0.44mm/px · z∈[-248,-29]mm · 6 of 768 slices shown]
[im 110/768  soft-tissue]
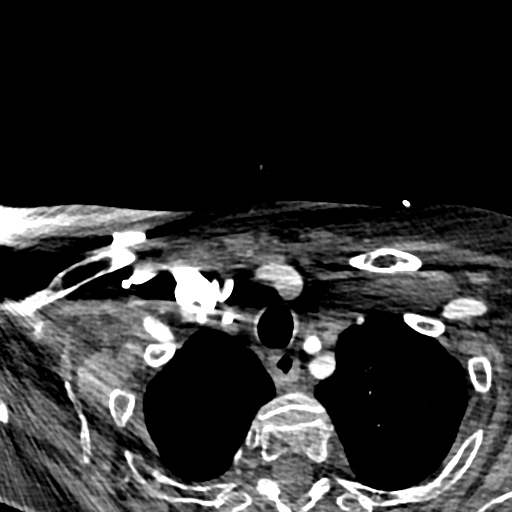
[im 220/768  bone]
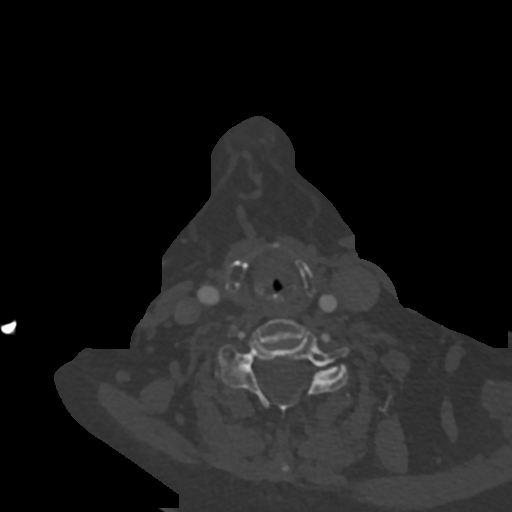
[im 329/768  soft-tissue]
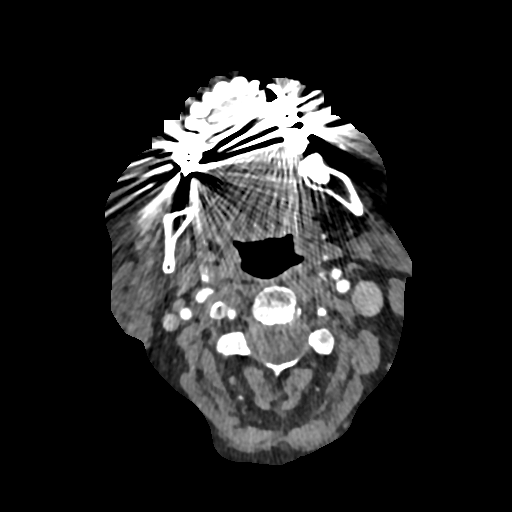
[im 439/768  bone]
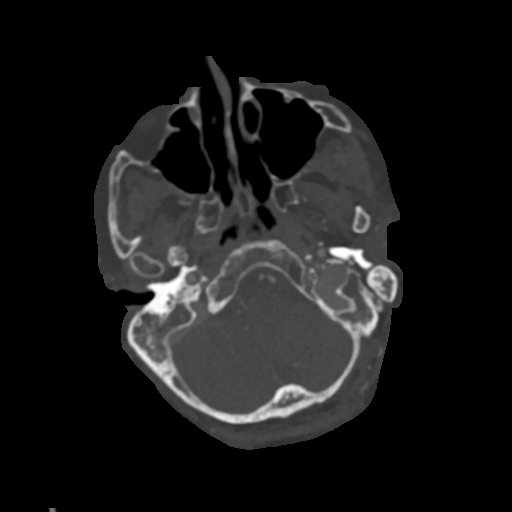
[im 548/768  soft-tissue]
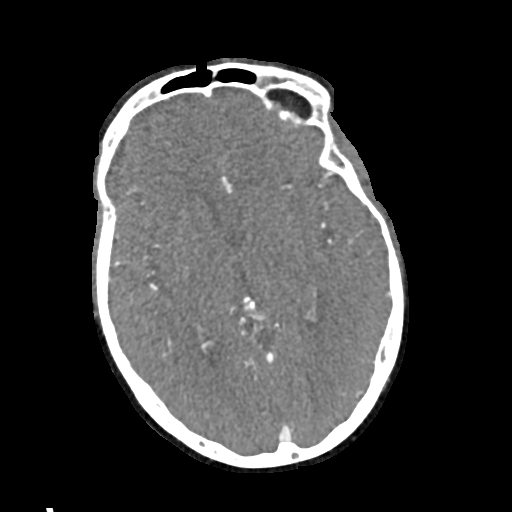
[im 658/768  bone]
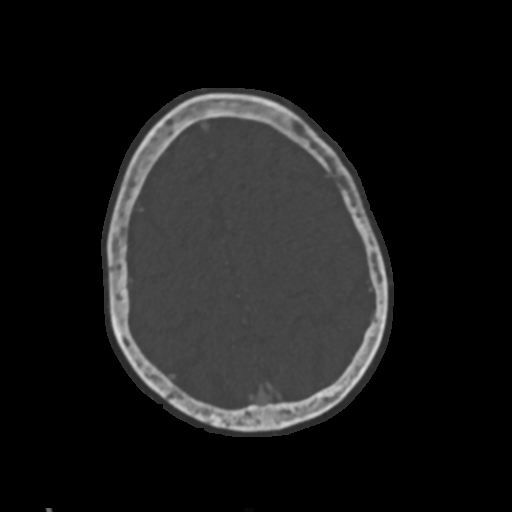

[6 of 33 positions shown; findings below may reference images not displayed]

FINDINGS: CTA NECK FINDINGS

Aortic arch: Standard branching. Imaged portion shows no evidence of
aneurysm or dissection. No significant stenosis of the major arch
vessel origins. Mild atherosclerotic disease aortic arch and
proximal great vessels.

Right carotid system: Mild atherosclerotic disease right carotid
bifurcation. Negative for stenosis.

Left carotid system: Mild atherosclerotic disease left carotid
bifurcation. Negative for stenosis.

Vertebral arteries: Both vertebral arteries patent to the basilar.
Mild calcific stenosis distal left vertebral artery at the skull
base.

Skeleton: Mild degenerative change cervical spine. No acute skeletal
abnormality.

Other neck: Negative

Upper chest: Lung apices clear bilaterally.

Review of the MIP images confirms the above findings

CTA HEAD FINDINGS

Anterior circulation: Cavernous carotid widely patent bilaterally.
Anterior middle cerebral arteries normal bilaterally. No stenosis or
aneurysm in the anterior circulation.

Posterior circulation: Both vertebral arteries patent to the
basilar. Mild stenosis distal left vertebral artery at the skull
base. Right PICA patent. Left PICA not visualized. Basilar widely
patent. AICA, superior cerebellar, and posterior cerebral arteries
widely patent without stenosis.

Aneurysm of the terminal basilar projecting superiorly. Mildly
lobulated or margins. Aneurysm measures approximately 8 x 9 mm in
diameter. No evidence of rupture or thrombus.

Venous sinuses: Normal venous enhancement

Anatomic variants: None

Review of the MIP images confirms the above findings
IMPRESSION: 1. 8 x 9 mm aneurysm of the terminal basilar without evidence of
rupture. No other aneurysm
2. No intracranial stenosis or large vessel occlusion
3. No significant carotid or vertebral artery stenosis.

## 2020-10-12 MED ORDER — IOHEXOL 350 MG/ML SOLN
75.0000 mL | Freq: Once | INTRAVENOUS | Status: AC | PRN
  Administered 2020-10-12: 75 mL via INTRAVENOUS

## 2020-10-12 MED ORDER — METOPROLOL TARTRATE 5 MG/5ML IV SOLN
5.0000 mg | INTRAVENOUS | Status: DC | PRN
Start: 2020-10-12 — End: 2020-10-18
  Administered 2020-10-12: 5 mg via INTRAVENOUS
  Filled 2020-10-12: qty 5

## 2020-10-12 MED ORDER — DICLOFENAC SODIUM 1 % EX GEL
2.0000 g | Freq: Four times a day (QID) | CUTANEOUS | Status: DC
  Administered 2020-10-12 – 2020-10-18 (×20): 2 g via TOPICAL
  Filled 2020-10-12: qty 100

## 2020-10-12 MED ORDER — HEPARIN BOLUS VIA INFUSION
3550.0000 [IU] | Freq: Once | INTRAVENOUS | Status: AC
  Administered 2020-10-12: 3550 [IU] via INTRAVENOUS
  Filled 2020-10-12: qty 3550

## 2020-10-12 MED ORDER — DILTIAZEM HCL-DEXTROSE 125-5 MG/125ML-% IV SOLN (PREMIX)
5.0000 mg/h | INTRAVENOUS | Status: DC
  Administered 2020-10-12 – 2020-10-13 (×2): 5 mg/h via INTRAVENOUS
  Filled 2020-10-12 (×3): qty 125

## 2020-10-12 MED ORDER — METHYLPREDNISOLONE SODIUM SUCC 40 MG IJ SOLR
40.0000 mg | Freq: Every day | INTRAMUSCULAR | Status: DC
  Administered 2020-10-13: 40 mg via INTRAVENOUS
  Filled 2020-10-12: qty 1

## 2020-10-12 MED ORDER — HEPARIN (PORCINE) 25000 UT/250ML-% IV SOLN
1150.0000 [IU]/h | INTRAVENOUS | Status: DC
  Administered 2020-10-12 – 2020-10-13 (×2): 1000 [IU]/h via INTRAVENOUS
  Administered 2020-10-14 – 2020-10-16 (×3): 1150 [IU]/h via INTRAVENOUS
  Filled 2020-10-12 (×5): qty 250

## 2020-10-12 MED ORDER — METOPROLOL TARTRATE 25 MG PO TABS
25.0000 mg | ORAL_TABLET | Freq: Two times a day (BID) | ORAL | Status: DC
  Administered 2020-10-12: 25 mg via ORAL
  Filled 2020-10-12: qty 1

## 2020-10-12 MED ORDER — DILTIAZEM LOAD VIA INFUSION
10.0000 mg | Freq: Once | INTRAVENOUS | Status: AC
  Administered 2020-10-12: 10 mg via INTRAVENOUS
  Filled 2020-10-12: qty 10

## 2020-10-12 MED ORDER — IOHEXOL 350 MG/ML SOLN
75.0000 mL | Freq: Once | INTRAVENOUS | Status: DC | PRN
Start: 2020-10-12 — End: 2020-10-18

## 2020-10-12 NOTE — Consult Note (Addendum)
Cardiology Consultation:   Patient ID: [name] MRN: [id number]; DOB: Feb 04, 1951  Admit date: 10/08/2020 Date of Consult: 10/12/2020  PCP:  [name], MD   [place]  Cardiologist:  No primary care provider on file.New Advanced Practice Provider:  No care team member to display Electrophysiologist:  None        Patient Profile:   [name] is a 70 y.o. female with a hx of HTN, GERD, hypothyroid, liver cirrhosis, + tobacco use, COPD, admitted 10/08/20 with back and abd pain along with hypokalemia,(K+ 2.5, Mg+ 1.1) hypomg+ hyponatremia who is being seen today for the evaluation of atrial fib new diagnosis with RVR at the request of Dr. Florene Glen.  History of Present Illness:   [name] with hx as above and recent diarrhea, and admitted 10/08/20 found to be in a fib with RVR, cirrhosis with portal hypertension, rt pl effusion and interval development of T10, T8 T12 and L1 compression fracture. MRI confirmed these findings.  Plan for kyphoplasty once a fib improved.   She was hypoxic as well, .her electrolytes were replaced. And treated for pain.  She was placed on prn metoprolol for HR > 120  Her EKGs all a fib with RVR, her tele strips she does go in and out of atrial fib. I do not see any anticoagulation.  Since admit she has had AMS changes. And CT head was done. No acute process, and AMS frelt to be due to hospital delirium.  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   No prior cardiac hx. No chest pain, she does vacuum at home at times, gets tired but no chest pain and no SOB, she lives with her daughter  EKG:  The EKG was personally reviewed and demonstrates:  Admit a fib with RVR at 116 no acute ST changes, a fib is new,  Follow up EKG with SR with PACs and PVCs.  No acute sT changes.  Then yesterday with a fib with RVR  Telemetry:  Telemetry was personally reviewed and demonstrates:  SR and PAF with RVR.  Na  131, K+ 4.0, BUN 11, Cr 0.52,  BNP 902 HGB 12.3, WBC 8.8 pts 234 TSH 0.241 Mg+, Na and K+ have been corrected.   cha2ds2 vasc of 3  ECHO 10/10/20  EF 55-60% no RWMA, moderate asymmetric hypertrophy mild LVH, G1DD.  Severely elevated PA systolic pressure 32.9 mmHg LA severely dilated, RA mildly dilated.  Mild MR, trivial AR and no AS.    Venous dopplers neg today for DVT of lower ext  PCXR  IMPRESSION: Right basilar atelectasis versus infiltrate, and possible small right pleural effusion  BP 165/111 to 155/125 P 151 to 114  Afebrile.  meds now amlodipine 5 mg, hydralazine 25 TID, lopressor 25 BID  At home amlodipine was 10 hydralazine 10 mg TID, zestril 40 daily.   Past Medical History:  Diagnosis Date  . ACROCHORDON 03/16/2010  . GI bleeding 06/2018  . Heart murmur   . Hypertension   . Hypothyroidism   . Liver cirrhosis ([place])   . Multiple duodenal ulcers 07/16/2018   associated with melena, anemia, gastritis. due to NSAID.  hx gastric ulcers and bleeding 2004, 2007  . Thyroid disease    S/p Radiation    Past Surgical History:  Procedure Laterality Date  . BIOPSY  07/16/2018   Procedure: BIOPSY;  Surgeon: Mauri Pole, MD;  Location: [facility];  Service: Endoscopy;;  .  BREAST EXCISIONAL BIOPSY Left    benign  . CHOLECYSTECTOMY N/A 12/07/2015   Procedure: LAPAROSCOPIC CHOLECYSTECTOMY WITH INTRAOPERATIVE CHOLANGIOGRAM;  Surgeon: Alphonsa Overall, MD;  Location: [facility];  Service: General;  Laterality: N/A;  . ERCP N/A 12/06/2015   Procedure: ENDOSCOPIC RETROGRADE CHOLANGIOPANCREATOGRAPHY (ERCP);  Surgeon: Milus Banister, MD;  Location: [facility];  Service: Endoscopy;  Laterality: N/A;  . ESOPHAGOGASTRODUODENOSCOPY (EGD) WITH PROPOFOL N/A 07/16/2018   Procedure: ESOPHAGOGASTRODUODENOSCOPY (EGD) WITH PROPOFOL;  Surgeon: Mauri Pole, MD;  Location: [facility];  Service: Endoscopy;  Laterality: N/A;  . IM NAILING TIBIA Right 07/19/2012   DR Maureen Ralphs  . SHOULDER  HEMI-ARTHROPLASTY Right 07/22/2013   Procedure: RIGHT SHOULDER HEMI-ARTHROPLASTY;  Surgeon: Augustin Schooling, MD;  Location: [facility];  Service: Orthopedics;  Laterality: Right;  . TIBIA IM NAIL INSERTION Right 07/19/2013   Procedure: INTRAMEDULLARY (IM) NAIL TIBIAL;  Surgeon: Gearlean Alf, MD;  Location: [facility];  Service: Orthopedics;  Laterality: Right;  . TUBAL LIGATION       Home Medications:  Prior to Admission medications   Medication Sig Start Date End Date Taking? Authorizing Provider  Ascorbic Acid (VITAMIN C) 100 MG tablet Take 100 mg by mouth daily.   Yes [provider]  B Complex Vitamins (B COMPLEX 1 PO) Take 1 tablet by mouth daily.   Yes [provider]  Bilberry, Vaccinium myrtillus, (BILBERRY FRUIT PO) Take 1 tablet by mouth daily.   Yes [provider]  calcium carbonate (OSCAL) 1500 (600 Ca) MG TABS tablet Take 600 mg of elemental calcium by mouth daily with breakfast.   Yes [provider]  citalopram (CELEXA) 20 MG tablet TAKE 1 TABLET BY MOUTH EVERY DAY. Patient taking differently: Take 20 mg by mouth daily. 03/17/14  Yes Patrecia Pour, MD  Flaxseed, Linseed, (FLAX SEED OIL PO) Take 1 tablet by mouth daily.   Yes [provider]  folic acid (FOLVITE) 621 MCG tablet Take 400 mcg by mouth daily.   Yes [provider]  hydrALAZINE (APRESOLINE) 10 MG tablet Take 10 mg by mouth 3 (three) times daily.   Yes [provider]  levothyroxine (SYNTHROID, LEVOTHROID) 137 MCG tablet Take 137 mcg by mouth daily. 05/23/18  Yes [provider]  lisinopril (ZESTRIL) 40 MG tablet Take 40 mg by mouth daily.   Yes [provider]  LUTEIN PO Take 1 tablet by mouth daily.   Yes [provider]  Multiple Vitamin (MULTIVITAMIN WITH MINERALS) TABS tablet Take 1 tablet by mouth daily.   Yes [provider]  Multiple Vitamins-Minerals (MULTIVITAMIN & MINERAL PO) Take 1 tablet by mouth daily.   Yes  [provider]  multivitamin-lutein (OCUVITE-LUTEIN) CAPS capsule Take 1 capsule by mouth daily.   Yes [provider]  Omega-3 Fatty Acids (FISH OIL CONCENTRATE) 1000 MG CAPS Take 1,000 mg by mouth daily.   Yes [provider]  VITAMIN E PO Take 1 tablet by mouth daily.   Yes [provider]  amLODipine (NORVASC) 10 MG tablet Take 1 tablet (10 mg total) by mouth daily. Patient not taking: Reported on 10/09/2020 12/09/15   Dana Allan I, MD  ferrous sulfate 325 (65 FE) MG EC tablet Take 1 tablet (325 mg total) by mouth 3 (three) times daily with meals for 30 days. Patient not taking: Reported on 10/09/2020 07/18/18 08/17/18  Damita Lack, MD    Inpatient Medications: Scheduled Meds: . amLODipine  5 mg Oral Daily  . cholecalciferol  1,000 Units Oral Daily  . citalopram  20 mg Oral Daily  . diclofenac Sodium  2 g Topical QID  . docusate sodium  100 mg Oral Daily  . feeding supplement  237 mL Oral BID BM  . hydrALAZINE  25 mg Oral TID  . levothyroxine  100 mcg Oral Q0600  . lisinopril  40 mg Oral Daily  . [START ON 10/13/2020] methylPREDNISolone (SOLU-MEDROL) injection  40 mg Intravenous Daily  . metoprolol tartrate  25 mg Oral BID  . omega-3 acid ethyl esters  1 g Oral Daily  . pantoprazole  40 mg Oral QHS   Continuous Infusions:  PRN Meds: acetaminophen, dextromethorphan-guaiFENesin, iohexol, ipratropium-albuterol, labetalol, metoprolol tartrate, morphine injection, oxyCODONE, polyethylene glycol, senna-docusate  Allergies:   No Active Allergies  Social History:   Social History   Socioeconomic History  . Marital status: Widowed    Spouse name: Not on file  . Number of children: Not on file  . Years of education: Not on file  . Highest education level: Not on file  Occupational History  . Not on file  Tobacco Use  . Smoking status: Current Every Day Smoker    Packs/day: 1.00    Years: 40.00    Pack years: 40.00    Types:  Cigarettes  . Smokeless tobacco: Never Used  Vaping Use  . Vaping Use: Never used  Substance and Sexual Activity  . Alcohol use: No    Comment: stopped drinking 5 weeks ago/updated 07/09/2018  . Drug use: No  . Sexual activity: Not on file  Other Topics Concern  . Not on file  Social History Narrative  . Not on file   Social Determinants of Health   Financial Resource Strain: Not on file  Food Insecurity: Not on file  Transportation Needs: Not on file  Physical Activity: Not on file  Stress: Not on file  Social Connections: Not on file  Intimate Partner Violence: Not on file    Family History:    Family History  Problem Relation Age of Onset  . Heart disease Mother   . Lung cancer Father   . Heart disease Brother   . Psoriasis Brother   . Heart disease Brother   . Healthy Daughter   . Healthy Daughter      ROS:  Please see the history of present illness.  General:no colds or fevers, no weight changes Skin:no rashes or ulcers HEENT:no blurred vision, no congestion CV:see HPI PUL:see HPI GI:no diarrhea constipation or melena, no indigestion though hx of GI bleed GU:no hematuria, no dysuria MS:no joint pain, no claudication Neuro:no syncope, no lightheadedness Endo:no diabetes, + thyroid disease  All other ROS reviewed and negative.     Physical Exam/Data:   Vitals:   10/12/20 0448 10/12/20 1336 10/12/20 1411 10/12/20 1416  BP: (!) 167/115 (!) 165/111 (!) 154/111 (!) 155/125  Pulse: (!) 129 (!) 151 (!) 136 (!) 114  Resp: (!) 24  (!) 22 20  Temp: 98.2 F (36.8 C) 97.8 F (36.6 C)    TempSrc: Oral Oral    SpO2: 97% 94% 100% 99%  Weight: 79 kg     Height:        Intake/Output Summary (Last 24 hours) at 10/12/2020 1709 Last data filed at 10/11/2020 2140 Gross per 24 hour  Intake 360 ml  Output 250 ml  Net 110 ml   Last 3 Weights 10/12/2020 10/11/2020 10/09/2020  Weight (lbs) 174 lb 2.6 oz 185 lb 10 oz 179 lb  7.3 oz  Weight (kg) 79 kg 84.2 kg 81.4 kg      Body mass index is 29.9 kg/m.  General:  Well nourished, well developed, in no acute distress, some dyspnea with talking HEENT: normal Lymph: no adenopathy Neck: no JVD Endocrine:  No thryomegaly Vascular: No carotid bruits; pedal pulses 2+ bilaterally  Cardiac:  normal S1, S2; RRR; no murmur gallup rub or click Lungs:  Diminished breath sounds to auscultation bilaterally, occ wheezing, no rhonchi or rales  Abd: soft, nontender, no hepatomegaly  Ext: no edema Musculoskeletal:  No deformities, BUE and BLE strength normal and equal Skin: warm and dry  Neuro:  Alert and oriented X 3 MAE follows commands, no focal abnormalities noted Psych:  Normal affect   Relevant CV Studies: Echo 10/10/20  IMPRESSIONS    1. Left ventricular ejection fraction, by estimation, is 55 to 60%. The  left ventricle has normal function. The left ventricle has no regional  wall motion abnormalities. There is moderate asymmetric hypertrophy of the  basal septal segment. The rest of  the LV segments demonstrate mild left ventricular hypertrophy. Left  ventricular diastolic parameters are consistent with Grade I diastolic  dysfunction (impaired relaxation). Elevated left atrial pressure.  2. Right ventricular systolic function is normal. The right ventricular  size is mildly enlarged. There is severely elevated pulmonary artery  systolic pressure. The estimated right ventricular systolic pressure is  19.1 mmHg.  3. Left atrial size was severely dilated.  4. Right atrial size was mildly dilated.  5. The mitral valve is abnormal. There are mildly elevated gradients with  flow acceleration noted across the mitral valve with mean gradient 42mmHg  at HR 77bpm. MVA by continuity 2.7cm2 with no significant mitral stenosis.  Mild mitral valve  regurgitation.  6. The aortic valve is tricuspid. There is mild calcification of the  aortic valve. There is moderate thickening of the aortic valve. The [place]  appears  fixed with trivial AR. Mild to moderate aortic valve  sclerosis/calcification is present, without any  evidence of aortic stenosis.  7. The inferior vena cava is normal in size with greater than 50%  respiratory variability, suggesting right atrial pressure of 3 mmHg.   Comparison(s): Compared to prior TTE in 2016, there is now severe  pulmonary HTN present with PASP 105mmHg (previously 55mmHg).   FINDINGS  Left Ventricle: Left ventricular ejection fraction, by estimation, is 55  to 60%. The left ventricle has normal function. The left ventricle has no  regional wall motion abnormalities. The left ventricular internal cavity  size was normal in size. There is  moderate asymmetric hypertrophy of the basal septal segment. The rest of  the LV segments demonstrate mild left ventricular hypertrophy. Left  ventricular diastolic parameters are consistent with Grade I diastolic  dysfunction (impaired relaxation).  Elevated left atrial pressure.   Right Ventricle: The right ventricular size is mildly enlarged. No  increase in right ventricular wall thickness. Right ventricular systolic  function is normal. There is severely elevated pulmonary artery systolic  pressure. The tricuspid regurgitant  velocity is 3.80 m/s, and with an assumed right atrial pressure of 3 mmHg,  the estimated right ventricular systolic pressure is 47.8 mmHg.   Left Atrium: Left atrial size was severely dilated.   Right Atrium: Right atrial size was mildly dilated.   Pericardium: There is no evidence of pericardial effusion.   Mitral Valve: There are mildly elevated gradients with flow acceleration  noted across the mitral valve with mean  gradient 78mmHg at HR 77bpm. MVA by  continuity 2.7cm2 with no significant stenosis. The mitral valve is  abnormal. There is moderate thickening  of the mitral valve leaflet(s). There is moderate calcification of the  mitral valve leaflet(s). Moderate mitral annular  calcification. Mild  mitral valve regurgitation. No evidence of mitral valve stenosis.   Tricuspid Valve: The tricuspid valve is normal in structure. Tricuspid  valve regurgitation is mild.   Aortic Valve: The aortic valve is tricuspid. There is mild calcification  of the aortic valve. There is moderate thickening of the aortic valve.  Aortic valve regurgitation LCC is fixed with trivial AR. Mild to moderate  aortic valve sclerosis/calcification  is present, without any evidence of aortic stenosis.   Pulmonic Valve: The pulmonic valve was normal in structure. Pulmonic valve  regurgitation is trivial.   Aorta: The aortic root and ascending aorta are structurally normal, with  no evidence of dilitation.   Venous: The inferior vena cava is normal in size with greater than 50%  respiratory variability, suggesting right atrial pressure of 3 mmHg.   IAS/Shunts: No atrial level shunt detected by color flow Doppler.      Laboratory Data:  High Sensitivity Troponin:  No results for input(s): TROPONINIHS in the last 720 hours.   Chemistry Recent Labs  Lab 10/11/20 0403 10/11/20 2302 10/12/20 0802  NA 127* 129* 131*  K 3.9 4.0 4.0  CL 90* 86* 88*  CO2 32 34* 35*  GLUCOSE 97 135* 140*  BUN 10 11 11   CREATININE 0.48 0.49 0.52  CALCIUM 8.9 9.1 9.4  GFRNONAA >60 >60 >60  ANIONGAP 5 9 8     Recent Labs  Lab 10/11/20 0403 10/11/20 2302 10/12/20 0802  PROT 5.6* 6.0* 6.1*  ALBUMIN 3.1* 3.3* 3.4*  AST 17 21 22   ALT 13 14 14   ALKPHOS 101 97 98  BILITOT 0.7 0.6 1.0   Hematology Recent Labs  Lab 10/11/20 0403 10/11/20 2302 10/12/20 0802  WBC 10.5 7.9 8.8  RBC 3.45* 3.78* 3.73*  HGB 11.5* 12.6 12.3  HCT 31.2* 33.8* 33.4*  MCV 90.4 89.4 89.5  MCH 33.3 33.3 33.0  MCHC 36.9* 37.3* 36.8*  RDW 13.0 13.1 12.9  PLT 195 213 234   BNP Recent Labs  Lab 10/11/20 2305 10/12/20 0802  BNP 949.4* 902.7*    DDimer No results for input(s): DDIMER in the last 168  hours.   Radiology/Studies:  CT ANGIO HEAD W OR WO CONTRAST  Result Date: 10/12/2020 CLINICAL DATA:  Delirium.  Cerebral aneurysm. EXAM: CT ANGIOGRAPHY HEAD AND NECK TECHNIQUE: Multidetector CT imaging of the head and neck was performed using the standard protocol during bolus administration of intravenous contrast. Multiplanar CT image reconstructions and MIPs were obtained to evaluate the vascular anatomy. Carotid stenosis measurements (when applicable) are obtained utilizing NASCET criteria, using the distal internal carotid diameter as the denominator. CONTRAST:  57mL OMNIPAQUE IOHEXOL 350 MG/ML SOLN COMPARISON:  CT head 10/11/2020 FINDINGS: CTA NECK FINDINGS Aortic arch: Standard branching. Imaged portion shows no evidence of aneurysm or dissection. No significant stenosis of the major arch vessel origins. Mild atherosclerotic disease aortic arch and proximal great vessels. Right carotid system: Mild atherosclerotic disease right carotid bifurcation. Negative for stenosis. Left carotid system: Mild atherosclerotic disease left carotid bifurcation. Negative for stenosis. Vertebral arteries: Both vertebral arteries patent to the basilar. Mild calcific stenosis distal left vertebral artery at the skull base. Skeleton: Mild degenerative change cervical spine. No acute skeletal abnormality. Other neck: Negative Upper  chest: Lung apices clear bilaterally. Review of the MIP images confirms the above findings CTA HEAD FINDINGS Anterior circulation: Cavernous carotid widely patent bilaterally. Anterior middle cerebral arteries normal bilaterally. No stenosis or aneurysm in the anterior circulation. Posterior circulation: Both vertebral arteries patent to the basilar. Mild stenosis distal left vertebral artery at the skull base. Right PICA patent. Left PICA not visualized. Basilar widely patent. AICA, superior cerebellar, and posterior cerebral arteries widely patent without stenosis. Aneurysm of the terminal  basilar projecting superiorly. Mildly lobulated or margins. Aneurysm measures approximately 8 x 9 mm in diameter. No evidence of rupture or thrombus. Venous sinuses: Normal venous enhancement Anatomic variants: None Review of the MIP images confirms the above findings IMPRESSION: 1. 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 No other aneurysm 2. No intracranial stenosis or large vessel occlusion 3. No significant carotid or vertebral artery stenosis. Electronically Signed   By: Franchot Gallo M.D.   On: 10/12/2020 11:38   CT HEAD WO CONTRAST  Result Date: 10/12/2020 CLINICAL DATA:  Delirium EXAM: CT HEAD WITHOUT CONTRAST TECHNIQUE: Contiguous axial images were obtained from the base of the skull through the vertex without intravenous contrast. COMPARISON:  None. FINDINGS: Brain: Cerebral ventricle sizes are concordant with the degree of cerebral volume loss. Patchy and confluent areas of decreased attenuation are noted throughout the deep and periventricular white matter of the cerebral hemispheres bilaterally, compatible with chronic microvascular ischemic disease. No evidence of large-territorial acute infarction. No parenchymal hemorrhage. No mass lesion. No extra-axial collection. No mass effect or midline shift. No hydrocephalus. Basilar cisterns are patent. Vascular: Aneurysmal dilatation of the basilar artery (6:26, 3:11). Skull: Heterogeneous appearance of the osseous structures. No acute fracture or focal lesion. Sinuses/Orbits: Paranasal sinuses and mastoid air cells are clear. The orbits are unremarkable. Other: None. IMPRESSION: 1. Aneurysmal dilatation of the basilar artery. Recommend CT angioagraphy for further evaluation. 2. Nonspecific heterogeneous appearance of the osseous structures. 3. No acute intracranial abnormality. These results will be called to the ordering clinician or representative by the Radiologist Assistant, and communication documented in the PACS or  [employer]. Electronically Signed   By: Iven Finn M.D.   On: 10/12/2020 00:00   CT ANGIO NECK W OR WO CONTRAST  Result Date: 10/12/2020 CLINICAL DATA:  Delirium.  Cerebral aneurysm. EXAM: CT ANGIOGRAPHY HEAD AND NECK TECHNIQUE: Multidetector CT imaging of the head and neck was performed using the standard protocol during bolus administration of intravenous contrast. Multiplanar CT image reconstructions and MIPs were obtained to evaluate the vascular anatomy. Carotid stenosis measurements (when applicable) are obtained utilizing NASCET criteria, using the distal internal carotid diameter as the denominator. CONTRAST:  16mL OMNIPAQUE IOHEXOL 350 MG/ML SOLN COMPARISON:  CT head 10/11/2020 FINDINGS: CTA NECK FINDINGS Aortic arch: Standard branching. Imaged portion shows no evidence of aneurysm or dissection. No significant stenosis of the major arch vessel origins. Mild atherosclerotic disease aortic arch and proximal great vessels. Right carotid system: Mild atherosclerotic disease right carotid bifurcation. Negative for stenosis. Left carotid system: Mild atherosclerotic disease left carotid bifurcation. Negative for stenosis. Vertebral arteries: Both vertebral arteries patent to the basilar. Mild calcific stenosis distal left vertebral artery at the skull base. Skeleton: Mild degenerative change cervical spine. No acute skeletal abnormality. Other neck: Negative Upper chest: Lung apices clear bilaterally. Review of the MIP images confirms the above findings CTA HEAD FINDINGS Anterior circulation: Cavernous carotid widely patent bilaterally. Anterior middle cerebral arteries normal bilaterally. No stenosis or aneurysm  in the anterior circulation. Posterior circulation: Both vertebral arteries patent to the basilar. Mild stenosis distal left vertebral artery at the skull base. Right PICA patent. Left PICA not visualized. Basilar widely patent. AICA, superior cerebellar, and posterior cerebral arteries  widely patent without stenosis. 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 Mildly lobulated or margins. Aneurysm measures approximately 8 x 9 mm in diameter. No evidence of rupture or thrombus. Venous sinuses: Normal venous enhancement Anatomic variants: None Review of the MIP images confirms the above findings IMPRESSION: 1. 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 No other aneurysm 2. No intracranial stenosis or large vessel occlusion 3. No significant carotid or vertebral artery stenosis. Electronically Signed   By: Franchot Gallo M.D.   On: 10/12/2020 11:38   MR THORACIC SPINE WO CONTRAST  Result Date: 10/09/2020 CLINICAL DATA:  Initial evaluation for acute spine fracture. EXAM: MRI THORACIC AND LUMBAR SPINE WITHOUT CONTRAST TECHNIQUE: Multiplanar and multiecho pulse sequences of the thoracic and lumbar spine were obtained without intravenous contrast. COMPARISON:  Prior CT from 10/08/2020. FINDINGS: MRI THORACIC SPINE FINDINGS Alignment: Trace sigmoid scoliosis. Alignment otherwise normal with preservation of the normal thoracic kyphosis. No listhesis. Vertebrae: Acute biconcave compression fracture seen involving the T9 vertebral body. Associated mild 25% central height loss with trace 3 mm bony retropulsion. Additional acute appearing compression fracture involving the T10 vertebral body with up to 60% height loss and 4 mm bony retropulsion. Associated marrow edema about these fractures extend to involve the posterior elements. Severe compression deformities involving the T12 and L1 vertebral bodies with up to 80-90% height loss and 4 mm bony retropulsion are late subacute to chronic in appearance with only trace residual marrow edema. Additional mild chronic compression fracture involving the T6 vertebral body with up to 30% height loss without bony retropulsion. Vertebral body height otherwise maintained with no other acute or chronic fracture. Underlying bone  marrow signal intensity is markedly heterogeneous, nonspecific, but could reflect underlying osteoporosis. No discrete or worrisome osseous lesions. No other abnormal marrow edema. Cord: Small syrinx involving the thoracic spinal cord measures up to 3 mm in maximal diameter, extending from the visualized upper cord to approximately the T8-9 level. Signal intensity within the cord is otherwise normal. Underlying normal cord caliber and morphology. Paraspinal and other soft tissues: Mild paraspinous edema adjacent to the acute T9 and T10 compression fractures. Paraspinous soft tissues demonstrate no other acute finding. Moderate layering right pleural effusion. Visualized visceral structures otherwise unremarkable. Disc levels: Normal expected for age multilevel disc desiccation seen throughout the thoracic spine. No significant disc bulge or focal disc herniation. Three-4 mm bony retropulsion at the levels of T10, T12, and L1 related to the underlying compression fractures without significant spinal stenosis. No other significant spinal stenosis within the thoracic spine. Foramina remain patent. MRI LUMBAR SPINE FINDINGS Segmentation: Standard. Lowest well-formed disc space labeled the L5-S1 level. Alignment: Mild exaggeration of the normal lumbar lordosis. No listhesis. Vertebrae: Late subacute to chronic compression fractures involving the T12 and L1 vertebral bodies again noted. Vertebral body height otherwise maintained with no other acute, subacute, or chronic compression fracture. Underlying bone marrow signal intensity is markedly heterogeneous without discrete or worrisome osseous lesion. No other abnormal marrow edema. Conus medullaris and cauda equina: Conus extends to the T12-L1 level. Conus and cauda equina appear normal. Paraspinal and other soft tissues: Chronic fatty atrophy noted throughout the posterior paraspinous musculature. Paraspinous soft tissues demonstrate no acute finding. 1.8 cm  simple cyst  noted within the interpolar left kidney. Additional 9 mm T1 hyperintense lesion at the inferior pole the left kidney noted, nonspecific, but likely a small proteinaceous and/or hemorrhagic cyst. Hepatic cirrhosis partially visualized. Disc levels: L1-2: Minimal annular disc bulge. Mild facet hypertrophy. No spinal stenosis. Foramina remain patent. L2-3: Disc desiccation with minimal annular disc bulge. Mild facet hypertrophy. No stenosis. L3-4: Minimal annular disc bulge. Mild facet hypertrophy. No canal or foraminal stenosis. L4-5: Disc desiccation with minimal annular disc bulge. Mild facet hypertrophy. No stenosis. L5-S1: Negative interspace. Mild facet hypertrophy. No canal or foraminal stenosis. IMPRESSION: 1. Acute compression fracture involving the T10 vertebral body with up to 60% height loss and 4 mm bony retropulsion. No significant spinal stenosis. 2. Acute compression fracture involving the T9 vertebral body with up to 25% height loss without significant bony retropulsion. 3. Late subacute to chronic severe compression fractures involving the T12 and L1 vertebral bodies with 80-90% height loss and up to 4 mm bony retropulsion. No significant spinal stenosis. 4. Markedly heterogeneous appearance of the visualized bone marrow, nonspecific, but could reflect underlying osteoporosis. 5. Small syrinx measuring up to 3 mm involving the majority of the thoracic spinal cord, terminating at the level of T8-9. 6. Moderate layering right pleural effusion. 7. Hepatic cirrhosis. Electronically Signed   By: Jeannine Boga M.D.   On: 10/09/2020 20:23   MR LUMBAR SPINE WO CONTRAST  Result Date: 10/09/2020 CLINICAL DATA:  Initial evaluation for acute spine fracture. EXAM: MRI THORACIC AND LUMBAR SPINE WITHOUT CONTRAST TECHNIQUE: Multiplanar and multiecho pulse sequences of the thoracic and lumbar spine were obtained without intravenous contrast. COMPARISON:  Prior CT from 10/08/2020. FINDINGS: MRI THORACIC  SPINE FINDINGS Alignment: Trace sigmoid scoliosis. Alignment otherwise normal with preservation of the normal thoracic kyphosis. No listhesis. Vertebrae: Acute biconcave compression fracture seen involving the T9 vertebral body. Associated mild 25% central height loss with trace 3 mm bony retropulsion. Additional acute appearing compression fracture involving the T10 vertebral body with up to 60% height loss and 4 mm bony retropulsion. Associated marrow edema about these fractures extend to involve the posterior elements. Severe compression deformities involving the T12 and L1 vertebral bodies with up to 80-90% height loss and 4 mm bony retropulsion are late subacute to chronic in appearance with only trace residual marrow edema. Additional mild chronic compression fracture involving the T6 vertebral body with up to 30% height loss without bony retropulsion. Vertebral body height otherwise maintained with no other acute or chronic fracture. Underlying bone marrow signal intensity is markedly heterogeneous, nonspecific, but could reflect underlying osteoporosis. No discrete or worrisome osseous lesions. No other abnormal marrow edema. Cord: Small syrinx involving the thoracic spinal cord measures up to 3 mm in maximal diameter, extending from the visualized upper cord to approximately the T8-9 level. Signal intensity within the cord is otherwise normal. Underlying normal cord caliber and morphology. Paraspinal and other soft tissues: Mild paraspinous edema adjacent to the acute T9 and T10 compression fractures. Paraspinous soft tissues demonstrate no other acute finding. Moderate layering right pleural effusion. Visualized visceral structures otherwise unremarkable. Disc levels: Normal expected for age multilevel disc desiccation seen throughout the thoracic spine. No significant disc bulge or focal disc herniation. Three-4 mm bony retropulsion at the levels of T10, T12, and L1 related to the underlying compression  fractures without significant spinal stenosis. No other significant spinal stenosis within the thoracic spine. Foramina remain patent. MRI LUMBAR SPINE FINDINGS Segmentation: Standard. Lowest well-formed disc space labeled the  L5-S1 level. Alignment: Mild exaggeration of the normal lumbar lordosis. No listhesis. Vertebrae: Late subacute to chronic compression fractures involving the T12 and L1 vertebral bodies again noted. Vertebral body height otherwise maintained with no other acute, subacute, or chronic compression fracture. Underlying bone marrow signal intensity is markedly heterogeneous without discrete or worrisome osseous lesion. No other abnormal marrow edema. Conus medullaris and cauda equina: Conus extends to the T12-L1 level. Conus and cauda equina appear normal. Paraspinal and other soft tissues: Chronic fatty atrophy noted throughout the posterior paraspinous musculature. Paraspinous soft tissues demonstrate no acute finding. 1.8 cm simple cyst noted within the interpolar left kidney. Additional 9 mm T1 hyperintense lesion at the inferior pole the left kidney noted, nonspecific, but likely a small proteinaceous and/or hemorrhagic cyst. Hepatic cirrhosis partially visualized. Disc levels: L1-2: Minimal annular disc bulge. Mild facet hypertrophy. No spinal stenosis. Foramina remain patent. L2-3: Disc desiccation with minimal annular disc bulge. Mild facet hypertrophy. No stenosis. L3-4: Minimal annular disc bulge. Mild facet hypertrophy. No canal or foraminal stenosis. L4-5: Disc desiccation with minimal annular disc bulge. Mild facet hypertrophy. No stenosis. L5-S1: Negative interspace. Mild facet hypertrophy. No canal or foraminal stenosis. IMPRESSION: 1. Acute compression fracture involving the T10 vertebral body with up to 60% height loss and 4 mm bony retropulsion. No significant spinal stenosis. 2. Acute compression fracture involving the T9 vertebral body with up to 25% height loss without  significant bony retropulsion. 3. Late subacute to chronic severe compression fractures involving the T12 and L1 vertebral bodies with 80-90% height loss and up to 4 mm bony retropulsion. No significant spinal stenosis. 4. Markedly heterogeneous appearance of the visualized bone marrow, nonspecific, but could reflect underlying osteoporosis. 5. Small syrinx measuring up to 3 mm involving the majority of the thoracic spinal cord, terminating at the level of T8-9. 6. Moderate layering right pleural effusion. 7. Hepatic cirrhosis. Electronically Signed   By: Jeannine Boga M.D.   On: 10/09/2020 20:23   CT Abdomen Pelvis W Contrast  Result Date: 10/08/2020 CLINICAL DATA:  Nonlocalized acute abdominal pain. Left-sided abdominal pain for 2 months. EXAM: CT ABDOMEN AND PELVIS WITH CONTRAST TECHNIQUE: Multidetector CT imaging of the abdomen and pelvis was performed using the standard protocol following bolus administration of intravenous contrast. CONTRAST:  158mL OMNIPAQUE IOHEXOL 300 MG/ML  SOLN COMPARISON:  CT abdomen pelvis 12/05/2015 FINDINGS: Lower chest: Trace right pleural effusion with associated right lower lobe passive atelectasis. Mitral annular calcifications. Possible tiny hiatal hernia. Hepatobiliary: Nodular hepatic contour. The hepatic parenchyma is diffusely hypodense compared to the splenic parenchyma consistent with fatty infiltration. No focal liver abnormality. Status post cholecystectomy. No biliary dilatation. Pancreas: No focal lesion. Normal pancreatic contour. No surrounding inflammatory changes. No main pancreatic ductal dilatation. Spleen: Normal in size without focal abnormality. Adrenals/Urinary Tract: No adrenal nodule bilaterally. Bilateral kidneys enhance symmetrically. 1.7 cm fluid density lesion within the left kidney likely represents a simple renal cyst. No hydronephrosis. No hydroureter. The urinary bladder is unremarkable. On delayed imaging, there is no urothelial wall  thickening and there are no filling defects in the opacified portions of the bilateral collecting systems or ureters. Stomach/Bowel: Stomach is within normal limits. No evidence of bowel wall thickening or dilatation. Appendix appears normal. Vascular/Lymphatic: Recanalized paraumbilical vein. Venous collateral are noted. Slight left ovarian venous congestion. The main portal, splenic, superior mesenteric veins are patent. No abdominal aorta or iliac aneurysm. Moderate to severe atherosclerotic plaque of the aorta and its branches. No abdominal, pelvic, or inguinal lymphadenopathy.  Reproductive: Uterus and bilateral adnexa are unremarkable. Other: No intraperitoneal free fluid. No intraperitoneal free gas. No organized fluid collection. Musculoskeletal: No abdominal wall hernia or abnormality. Diffusely decreased bone density. No suspicious lytic or blastic osseous lesions. No acute displaced fracture. Multilevel degenerative changes of the spine with interval development of compression fractures of the T10, T12, L1 with greater than 90% height loss. IMPRESSION: 1. Cirrhosis with portal hypertension. No definite focal hepatic lesion; however limited evaluation on this single phase contrast study. Recommend MRI liver protocol for further evaluation. 2. Trace volume right pleural effusion. 3. Possible tiny hiatal hernia. 4. Interval development of T10, T12, L1 compression fractures with greater than 90% height loss. Correlate with point tenderness to palpation to evaluate for acute component. 5. Aortic Atherosclerosis (ICD10-I70.0) including mitral annular calcifications. Electronically Signed   By: Iven Finn M.D.   On: 10/08/2020 19:58   DG CHEST PORT 1 VIEW  Result Date: 10/12/2020 CLINICAL DATA:  Shortness of breath. EXAM: PORTABLE CHEST 1 VIEW COMPARISON:  07/18/2013 FINDINGS: Mild cardiomegaly noted as well as pulmonary vascular congestion. Infiltrate or atelectasis is seen at the right lung base, and  small right pleural effusion cannot be excluded. Left lung is clear. Multiple old right rib fracture deformities are noted, as well as right shoulder prosthesis. IMPRESSION: Right basilar atelectasis versus infiltrate, and possible small right pleural effusion. Mild cardiomegaly and pulmonary vascular congestion. Electronically Signed   By: Marlaine Hind M.D.   On: 10/12/2020 08:21   ECHOCARDIOGRAM COMPLETE  Result Date: 10/10/2020    ECHOCARDIOGRAM REPORT   Patient Name:   HOLLIS OH Lipsett Date of Exam: 10/10/2020 Medical Rec #:  [id number]         Height:       64.0 in Accession #:    [id number]        Weight:       179.5 lb Date of Birth:  07-29-1950         BSA:          1.868 m Patient Age:    53 years          BP:           174/92 mmHg Patient Gender: F                 HR:           72 bpm. Exam Location:  Inpatient Procedure: 2D Echo, Cardiac Doppler and Color Doppler Indications:    Cardiomyopathy-Unspecified I42.9  History:        Patient has prior history of Echocardiogram examinations, most                 recent 06/14/2015. Risk Factors:Hypertension.  Sonographer:    Bernadene Person RDCS Referring Phys: [id number] ANKIT CHIRAG AMIN IMPRESSIONS  1. Left ventricular ejection fraction, by estimation, is 55 to 60%. The left ventricle has normal function. The left ventricle has no regional wall motion abnormalities. There is moderate asymmetric hypertrophy of the basal septal segment. The rest of the LV segments demonstrate mild left ventricular hypertrophy. Left ventricular diastolic parameters are consistent with Grade I diastolic dysfunction (impaired relaxation). Elevated left atrial pressure.  2. Right ventricular systolic function is normal. The right ventricular size is mildly enlarged. There is severely elevated pulmonary artery systolic pressure. The estimated right ventricular systolic pressure is 25.4 mmHg.  3. Left atrial size was severely dilated.  4. Right atrial size was mildly dilated.  5. The  mitral valve  is abnormal. There are mildly elevated gradients with flow acceleration noted across the mitral valve with mean gradient 70mmHg at HR 77bpm. MVA by continuity 2.7cm2 with no significant mitral stenosis. Mild mitral valve regurgitation.  6. The aortic valve is tricuspid. There is mild calcification of the aortic valve. There is moderate thickening of the aortic valve. The [place] appears fixed with trivial AR. Mild to moderate aortic valve sclerosis/calcification is present, without any evidence of aortic stenosis.  7. The inferior vena cava is normal in size with greater than 50% respiratory variability, suggesting right atrial pressure of 3 mmHg. Comparison(s): Compared to prior TTE in 2016, there is now severe pulmonary HTN present with PASP 76mmHg (previously 38mmHg). FINDINGS  Left Ventricle: Left ventricular ejection fraction, by estimation, is 55 to 60%. The left ventricle has normal function. The left ventricle has no regional wall motion abnormalities. The left ventricular internal cavity size was normal in size. There is  moderate asymmetric hypertrophy of the basal septal segment. The rest of the LV segments demonstrate mild left ventricular hypertrophy. Left ventricular diastolic parameters are consistent with Grade I diastolic dysfunction (impaired relaxation). Elevated left atrial pressure. Right Ventricle: The right ventricular size is mildly enlarged. No increase in right ventricular wall thickness. Right ventricular systolic function is normal. There is severely elevated pulmonary artery systolic pressure. The tricuspid regurgitant velocity is 3.80 m/s, and with an assumed right atrial pressure of 3 mmHg, the estimated right ventricular systolic pressure is 65.4 mmHg. Left Atrium: Left atrial size was severely dilated. Right Atrium: Right atrial size was mildly dilated. Pericardium: There is no evidence of pericardial effusion. Mitral Valve: There are mildly elevated gradients with flow  acceleration noted across the mitral valve with mean gradient 19mmHg at HR 77bpm. MVA by continuity 2.7cm2 with no significant stenosis. The mitral valve is abnormal. There is moderate thickening  of the mitral valve leaflet(s). There is moderate calcification of the mitral valve leaflet(s). Moderate mitral annular calcification. Mild mitral valve regurgitation. No evidence of mitral valve stenosis. Tricuspid Valve: The tricuspid valve is normal in structure. Tricuspid valve regurgitation is mild. Aortic Valve: The aortic valve is tricuspid. There is mild calcification of the aortic valve. There is moderate thickening of the aortic valve. Aortic valve regurgitation LCC is fixed with trivial AR. Mild to moderate aortic valve sclerosis/calcification  is present, without any evidence of aortic stenosis. Pulmonic Valve: The pulmonic valve was normal in structure. Pulmonic valve regurgitation is trivial. Aorta: The aortic root and ascending aorta are structurally normal, with no evidence of dilitation. Venous: The inferior vena cava is normal in size with greater than 50% respiratory variability, suggesting right atrial pressure of 3 mmHg. IAS/Shunts: No atrial level shunt detected by color flow Doppler.  LEFT VENTRICLE PLAX 2D LVIDd:         4.50 cm  Diastology LVIDs:         3.00 cm  LV e' medial:    5.33 cm/s LV PW:         1.10 cm  LV E/e' medial:  22.7 LV IVS:        1.10 cm  LV e' lateral:   7.80 cm/s LVOT diam:     2.10 cm  LV E/e' lateral: 15.5 LV SV:         103 LV SV Index:   55 LVOT Area:     3.46 cm  RIGHT VENTRICLE RV S prime:     17.40 cm/s TAPSE (M-mode): 2.6 cm LEFT  ATRIUM             Index       RIGHT ATRIUM           Index LA diam:        5.60 cm 3.00 cm/m  RA Area:     19.20 cm LA Vol (A2C):   86.0 ml 46.03 ml/m RA Volume:   54.70 ml  29.28 ml/m LA Vol (A4C):   90.6 ml 48.49 ml/m LA Biplane Vol: 88.2 ml 47.21 ml/m  AORTIC VALVE LVOT Vmax:   119.00 cm/s LVOT Vmean:  78.800 cm/s LVOT VTI:    0.296  m  AORTA Ao Root diam: 3.00 cm Ao Asc diam:  3.30 cm MITRAL VALVE                TRICUSPID VALVE MV Area (PHT): 5.66 cm     TR Peak grad:   57.8 mmHg MV Decel Time: 134 msec     TR Vmax:        380.00 cm/s MV E velocity: 121.00 cm/s MV A velocity: 144.00 cm/s  SHUNTS MV E/A ratio:  0.84         Systemic VTI:  0.30 m                             Systemic Diam: 2.10 cm Gwyndolyn Kaufman MD Electronically signed by Gwyndolyn Kaufman MD Signature Date/Time: 10/10/2020/12:13:50 PM    Final    VAS [place] LOWER EXTREMITY VENOUS (DVT)  Result Date: 10/12/2020  Lower Venous DVT Study Indications: Atrial fibrillation.  Limitations: Poor ultrasound/tissue interface. Comparison Study: No prior studies. Performing Technologist: Darlin Coco RDMS,RVT  Examination Guidelines: A complete evaluation includes B-mode imaging, spectral Doppler, color Doppler, and power Doppler as needed of all accessible portions of each vessel. Bilateral testing is considered an integral part of a complete examination. Limited examinations for reoccurring indications may be performed as noted. The reflux portion of the exam is performed with the patient in reverse Trendelenburg.  +---------+---------------+---------+-----------+----------+-------------------+ RIGHT    CompressibilityPhasicitySpontaneityPropertiesThrombus Aging      +---------+---------------+---------+-----------+----------+-------------------+ CFV      Full           Yes      Yes                                      +---------+---------------+---------+-----------+----------+-------------------+ SFJ      Full                                                             +---------+---------------+---------+-----------+----------+-------------------+ FV Prox  Full                                                             +---------+---------------+---------+-----------+----------+-------------------+ FV Mid   Full                                                              +---------+---------------+---------+-----------+----------+-------------------+  FV DistalFull                                                             +---------+---------------+---------+-----------+----------+-------------------+ PFV      Full                                                             +---------+---------------+---------+-----------+----------+-------------------+ POP      Full           Yes      Yes                                      +---------+---------------+---------+-----------+----------+-------------------+ PTV      Full                                                             +---------+---------------+---------+-----------+----------+-------------------+ PERO                                                  Not well visualized +---------+---------------+---------+-----------+----------+-------------------+   +---------+---------------+---------+-----------+----------+-------------------+ LEFT     CompressibilityPhasicitySpontaneityPropertiesThrombus Aging      +---------+---------------+---------+-----------+----------+-------------------+ CFV      Full           Yes      Yes                                      +---------+---------------+---------+-----------+----------+-------------------+ SFJ      Full                                                             +---------+---------------+---------+-----------+----------+-------------------+ FV Prox  Full                                                             +---------+---------------+---------+-----------+----------+-------------------+ FV Mid   Full                                                             +---------+---------------+---------+-----------+----------+-------------------+ FV DistalFull                                                              +---------+---------------+---------+-----------+----------+-------------------+  PFV      Full                                                             +---------+---------------+---------+-----------+----------+-------------------+ POP      Full           Yes      Yes                                      +---------+---------------+---------+-----------+----------+-------------------+ PTV      Full                                                             +---------+---------------+---------+-----------+----------+-------------------+ PERO                                                  Not well visualized +---------+---------------+---------+-----------+----------+-------------------+     Summary: RIGHT: - There is no evidence of deep vein thrombosis in the lower extremity. However, portions of this examination were limited- see technologist comments above.  - No cystic structure found in the popliteal fossa.  LEFT: - There is no evidence of deep vein thrombosis in the lower extremity. However, portions of this examination were limited- see technologist comments above.  - No cystic structure found in the popliteal fossa.  *See table(s) above for measurements and observations. Electronically signed by Jamelle Haring on 10/12/2020 at 4:31:33 PM.    Final    US Abdomen Limited RUQ (LIVER/GB)  Result Date: 10/09/2020 CLINICAL DATA:  Transaminitis EXAM: ULTRASOUND ABDOMEN LIMITED RIGHT UPPER QUADRANT COMPARISON:  CT abdomen pelvis 10/08/2020 FINDINGS: Gallbladder: No gallstones or wall thickening visualized. No sonographic Murphy sign noted by sonographer. Common bile duct: Diameter: 8 mm Liver: Nodular hepatic contour suspicious for cirrhosis. No focal hepatic lesion. Portal vein is patent on color Doppler imaging with normal direction of blood flow towards the liver. Other: None. IMPRESSION: Cirrhotic liver morphology without focal hepatic lesion. Electronically Signed   By: Miachel Roux M.D.   On: 10/09/2020 09:06     Assessment and Plan:   1. Atrial fib with RVR, at times up to 143 has been in and out of afib to SR.  But mostly a fib.  Has not rec'd anticoagulation with need for Kyphoplasty add heparin for now defer to Dr. Margaretann Loveless.  May need to hold amlodipine and place on IV dilt with normal EF. No hx of CAD  Vs add BB though BB has been added.   but with hx of COPD and hypoxia on arrival would be hesitant. Will defer to Dr. Margaretann Loveless. 2. Elevated PA systolic pressure and RV systolic pressure in 98.9 mmHg   3. HTN poorly controlled IV dilt will help and stop amlodipine. At home she was on lisinopril as well. This is just being added back.   4. Hypoxia planning for CT PE protocol her venous doppler  neg for DVT. She is on steroids  5. Back pain due to thoracic compression fracture plan for kyphoplasty once HR and BP controlled 6. 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 abd pain/liver cirrhosis/ETOG abuse. Per IM  8. + tobacco- 1ppd wants to stop but has been unable.  use wit hx of COPD on steroids, + ETOH 2-3 glasses of wine per day.   9. Hypothyroidism wit TSH of 0.241  And synthroid just reduced today to 100 mcg daily.  10. HLD on lovaza continue   Risk Assessment/Risk Scores:          CHA2DS2-VASc Score = 3  This indicates a 3.2% annual risk of stroke. The patient's score is based upon: CHF History: No HTN History: Yes Diabetes History: No Stroke History: No Vascular Disease History: No Age Score: 1 Gender Score: 1         For questions or updates, please contact [facility] Please consult [email or web address] for contact info under    Signed, Cecilie Kicks, NP  10/12/2020 5:09 PM  Patient seen and examined with Cecilie Kicks NP.  Agree as above, with the following exceptions and changes as noted below.  This is a 70 year old female with hypertension, GERD, hypothyroidism, liver cirrhosis, COPD with tobacco use, admitted with back pain which will  require kyphoplasty found to be in atrial fibrillation with rapid ventricular response.  Rates are labile with RVR and she has had hypertension as well. Gen: NAD, CV: Irregular rhythm and tachycardic, no murmurs, Lungs: clear, Abd: soft, Extrem: Warm, well perfused, no edema, Neuro/Psych: alert and oriented x 3, normal mood and affect. All available labs, radiology testing, previous records reviewed.  I reviewed with the primary service that we will utilize diltiazem infusion IV for the benefit of her blood pressure as well as rate control.  This can be easily titrated, and if kyphoplasty is planned in hospital, this may be beneficial in the preoperative course.  In addition she has a CHA2DS2-VASc score of approximately 3 and therefore has an elevated stroke risk.  We would not want to start [place] prior to surgery given the need to then eventually hold, therefore we will use IV heparin at this time.  We will monitor closely for bleeding given her history of cirrhosis with portal hypertension, however she does not have signs of a coagulopathy on laboratory studies today, and hopefully this will be low risk.  Cardiology will follow along.  Elouise Munroe, MD 10/12/20 6:32 PM

## 2020-10-12 NOTE — Plan of Care (Signed)

## 2020-10-12 NOTE — Progress Notes (Signed)
Lower extremity venous bilateral study completed.   Please see CV Proc for preliminary results.   Deidra Spease, RDMS, RVT  

## 2020-10-12 NOTE — Progress Notes (Signed)
RN received phone call from patients family. Multiple people on the phone yelling and cursing at Probation officer. Phone call was terminated to allow them to calm down to have a conversation. Call returned to [place] who states she is very worried about her mother. She was updated to the best of my ability. [name] would like providers to call family if no one is present so they can stay informed.

## 2020-10-12 NOTE — Progress Notes (Signed)
ANTICOAGULATION CONSULT NOTE - Initial Consult  Pharmacy Consult for IV Heparin Indication: atrial fibrillation  No Active Allergies  Patient Measurements: Height: 5\' 4"  (162.6 cm) Weight: 79 kg (174 lb 2.6 oz) IBW/kg (Calculated) : 54.7 Heparin Dosing Weight: 71.6 kg  Vital Signs: Temp: 97.8 F (36.6 C) (04/20 1336) Temp Source: Oral (04/20 1336) BP: 155/125 (04/20 1416) Pulse Rate: 114 (04/20 1416)  Labs: Recent Labs    10/11/20 0403 10/11/20 2302 10/12/20 0802  HGB 11.5* 12.6 12.3  HCT 31.2* 33.8* 33.4*  PLT 195 213 234  LABPROT 12.9  --   --   INR 1.0  --   --   CREATININE 0.48 0.49 0.52    Estimated Creatinine Clearance: 65.6 mL/min (by C-G formula based on SCr of 0.52 mg/dL).   Medical History: Past Medical History:  Diagnosis Date  . [place] 03/16/2010  . GI bleeding 06/2018  . Heart murmur   . Hypertension   . Hypothyroidism   . Liver cirrhosis ([place])   . Multiple duodenal ulcers 07/16/2018   associated with melena, anemia, gastritis. due to NSAID.  hx gastric ulcers and bleeding 2004, 2007  . Thyroid disease    S/p Radiation    Assessment: 70 yr old woman admitted on 10/08/20 with back/abd pain, along with hypokalemia (2.5) and hypomag (1.1). Pt with new dx of afib with RVR. Plan for kyphoplasty; pharmacy was consulted to dose IV heparin for a fib. Of note, pt with cirrhosis and portal hypertension. Pt was on no anticoagulation PTA.  H/H 12.3/33.4, plt 234; INR 1.0  Goal of Therapy:  Heparin level 0.3-0.7 units/ml Monitor platelets by anticoagulation protocol: Yes   Plan:  Heparin 3550 units IV bolus X 1, followed by heparin infusion at 1000 units/hr Check heparin level in ~7 hrs Monitor daily heparin level, CBC Monitor for bleeding F/U plans for kyphoplasty  Gillermina Hu, PharmD, BCPS, [facility] Clinical Pharmacist 10/12/2020,5:47 PM

## 2020-10-12 NOTE — Progress Notes (Signed)
OT Cancellation Note  Patient Details Name: [name] MRN: [id number] DOB: [birth year]   Cancelled Treatment:    Reason Eval/Treat Not Completed: Patient not medically ready.  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  Possible procedure today.  OT to continue efforts.    Mrytle Bento D Yanira Tolsma 10/12/2020, 10:26 AM

## 2020-10-12 NOTE — Progress Notes (Signed)
   10/12/20 0026  Assess: MEWS Score  Temp 98.2 F (36.8 C)  BP (!) 146/108  Pulse Rate (!) 117  Resp (!) 22  SpO2 100 %  O2 Device Nasal Cannula  O2 Flow Rate (L/min) 2 L/min  Assess: MEWS Score  MEWS Temp 0  MEWS Systolic 0  MEWS Pulse 2  MEWS RR 1  MEWS LOC 0  MEWS Score 3  MEWS Score Color Yellow  Assess: if the MEWS score is Yellow or Red  Were vital signs taken at a resting state? Yes  Focused Assessment No change from prior assessment  Early Detection of Sepsis Score *See Row Information* Low  MEWS guidelines implemented *See Row Information* No, previously yellow, continue vital signs every 4 hours  Document  Patient Outcome Not stable and remains on department  Progress note created (see row info) Yes

## 2020-10-12 NOTE — Progress Notes (Signed)
PROGRESS NOTE    [name]  MVE:[id number] DOB: [birth year] DOA: 10/08/2020 PCP: [name], MD   Chief Complaint  Patient presents with  . Back Pain  . Abdominal Pain   Brief Narrative:  70 year old with history of HTN, hypothyroidism, GERD, liver cirrhosis presents with abdominal and low back pain.  Upon admission patient had hyponatremia, hypokalemia, hypomagnesemia CT abdomen pelvis showed cirrhosis with portal hypertension, right pleural effusion with interval development of T10, T8 T12 and L1 compression fracture.  MRI confirmed these findings.  IR consulted who recommended kyphoplasty. Plan pending improvement in afib with RVR.   Assessment & Plan:   Principal Problem:   Back pain Active Problems:   HYPERTENSION, BENIGN ESSENTIAL   Hyponatremia   Hypothyroidism   Hypokalemia due to excessive gastrointestinal loss of potassium   Abdominal pain   Hypomagnesemia   Compression fracture of body of thoracic vertebra (HCC)   History of cirrhosis of liver  Atrial Fibrillation with RVR No prior hx, appears new onset Follow TSH (on low side, synthroid reduced), echo - will follow LE [place] and consider CT PE protocol given O2 requirement Has elevated BNP as well chadvasc at least 3, would be candidate for anticoagulation, will hold off for now pending procedure IV metop prn.  Oral metop scheduled. Will ask cardiology to see as well  Acute Metabolic Encephalopathy Suspect this is 2/2 hospital delirium in setting of opiate use Blood gas with normal pCO2 TSH low, synthroid reduced.  Follow b12, folate, ammonia.  Delirium precautions  Back pain due Thoracic compression fracture -Significant amount of pain.  CT abdomen pelvis shows compression fracture T10, T12 and L1 -Pain control-oxycodone IR 10 mg and IV morphine as needed.  Bowel regimen. -IR consulted, MRI performed -> acute compression fx involving the T10 vertebral body with up to 60% height loss and 4 mm  bony retropulsion, no significant spinal stenosis, acute compression fx involving T9 vertebral body with up to 25% height loss without significant bony retropulsion, late to subacute to chronic severe compression fractures involving the T12 and L1 vertebral bodies with 80-90% height loss and up to 4 mm bony retropulsion, no significant spinal stenosis.  Small syrinx measuring up to 3 mm involving the majority of the thoracic spinal cord, terminating at T8-9.  Plans for kyphoplasty - pending improvement in HR.   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  Pain control -LFTs relatively normal, lipase normal -Right upper quadrant ultrasound-showed cirrhotic pathology -Acute hepatitis-negative -Echo- EF 55 to 60%, grade 1 DD, severely elevated pulmonary artery pressure - Outpatient GI follow-up, notified about her GI to arrange for follow-up -Endoscopy 06/2018- showed gastritis and multiple duodenal ulcer secondary to NSAID  Vitamin D deficiency - Supplement started  Daily tobacco use with history of COPD, mild exacerbation -Solu-Medrol 40 mg every 8 hours, bronchodilators, I-S, flutter valve -- discussed smoking cessation and discussed possibilities of meds for assistance with cessation, she declined  Hyponatremia -Secondary to cirrhosis.  Initially due to intravascular volume depletion required fluids with due to signs of volume overload and has been stopped for now  Hypokalemia, resolved - replace and follow as needed  Hypomagnesemia - replace and follow as needed  Hypothyroidism -Low TSH elevated T4.  Reduced to Synthroid 100  mcg daily  Essential hypertension: UnControlled -Continue lisinopril 40 mg daily.   Increase hydralazine 25 mg 3 times daily.  Add Norvasc 5 mg daily  Hyperlipidemia -Continue [place]  Hyperglycemia Follow a1c  DVT prophylaxis: SCD Code Status: full  Family Communication: daughter at bedside Disposition:   Status is: Inpatient  Remains inpatient appropriate because:Inpatient level of care appropriate due to severity of illness   Dispo: The patient is from: Home              Anticipated d/c is to: Home              Patient currently is not medically stable to d/c.   Difficult to place patient No       Consultants:   IR  cardiology  Procedures none  Antimicrobials: Anti-infectives (From admission, onward)   Start     Dose/Rate Route Frequency Ordered Stop   10/11/20 0600  ceFAZolin (ANCEF) IVPB 2g/100 mL premix        2 g 200 mL/hr over 30 Minutes Intravenous To Radiology 10/10/20 1448 10/12/20 0600         Subjective: No back pain at rest, 8-9 in intensity when up and about Been going on for about 2 months  Objective: Vitals:   10/12/20 0448 10/12/20 1336 10/12/20 1411 10/12/20 1416  BP: (!) 167/115 (!) 165/111 (!) 154/111 (!) 155/125  Pulse: (!) 129 (!) 151 (!) 136 (!) 114  Resp: (!) 24  (!) 22 20  Temp: 98.2 F (36.8 C) 97.8 F (36.6 C)    TempSrc: Oral Oral    SpO2: 97% 94% 100% 99%  Weight: 79 kg     Height:        Intake/Output Summary (Last 24 hours) at 10/12/2020 1429 Last data filed at 10/11/2020 2140 Gross per 24 hour  Intake 760 ml  Output 850 ml  Net -90 ml   Filed Weights   10/09/20 0005 10/11/20 0349 10/12/20 0448  Weight: 81.4 kg 84.2 kg 79 kg    Examination:  General exam: Appears calm and comfortable  Respiratory system: Clear to auscultation. Respiratory effort normal. Cardiovascular system: tachycardic, irregularly irregular Gastrointestinal system: Abdomen is nondistended, soft and nontender. Central nervous system: Alert and oriented. CN 2-12 intact.  Sensation intact to light touch.  Symmetric  strength to all extremities. Extremities: no lee Skin: No rashes, lesions or ulcers Psychiatry: Judgement and insight appear normal. Mood & affect appropriate.     Data Reviewed: I have personally reviewed following labs and imaging studies  CBC: Recent Labs  Lab 10/08/20 1641 10/09/20 0020 10/10/20 0346 10/11/20 0403 10/11/20 2302 10/12/20 0802  WBC 8.7 8.9 7.2 10.5 7.9 8.8  NEUTROABS 7.1  --   --  8.2*  --  8.2*  HGB 14.0 13.0 11.6* 11.5* 12.6 12.3  HCT RESULTS UNAVAILABLE DUE TO INTERFERING SUBSTANCE RESULTS UNAVAILABLE DUE TO INTERFERING SUBSTANCE RESULTS UNAVAILABLE DUE TO INTERFERING SUBSTANCE 31.2* 33.8* 33.4*  MCV RESULTS UNAVAILABLE DUE TO INTERFERING SUBSTANCE RESULTS UNAVAILABLE DUE TO INTERFERING SUBSTANCE RESULTS UNAVAILABLE DUE TO INTERFERING SUBSTANCE 90.4 89.4 89.5  PLT 213 209 160 195 213 607    Basic Metabolic Panel: Recent Labs  Lab 10/08/20 1641 10/09/20 0020 10/09/20 0807 10/10/20 0346 10/11/20 0403 10/11/20 2302 10/12/20 0802  NA  --  124* 128* 125* 127* 129* 131*  K  --  3.6 3.2* 4.3 3.9 4.0 4.0  CL  --  85* 86* 89* 90* 86* 88*  CO2  --  29 33*  31 32 34* 35*  GLUCOSE  --  106* 89 106* 97 135* 140*  BUN  --  <5* <5* 7* 10 11 11   CREATININE  --  0.51 0.53 0.63 0.48 0.49 0.52  CALCIUM  --  8.2* 8.0* 8.5* 8.9 9.1 9.4  MG 1.1* 2.6*  --  1.8 1.6* 1.4*  --   PHOS  --  3.4  --   --   --   --   --     GFR: Estimated Creatinine Clearance: 65.6 mL/min (by C-G formula based on SCr of 0.52 mg/dL).  Liver Function Tests: Recent Labs  Lab 10/09/20 0020 10/10/20 0346 10/11/20 0403 10/11/20 2302 10/12/20 0802  AST 25 18 17 21 22   ALT 15 16 13 14 14   ALKPHOS 127* 101 101 97 98  BILITOT 1.2 0.9 0.7 0.6 1.0  PROT 5.9* 5.3* 5.6* 6.0* 6.1*  ALBUMIN 3.2* 2.8* 3.1* 3.3* 3.4*    CBG: Recent Labs  Lab 10/11/20 1151 10/11/20 1556 10/12/20 0026 10/12/20 0612 10/12/20 1118  GLUCAP 122* 281* 137* 148* 145*     Recent Results (from the past 240  hour(s))  Resp Panel by RT-PCR (Flu [place]&B, Covid) Nasopharyngeal Swab     Status: None   Collection Time: 10/08/20  5:33 PM   Specimen: Nasopharyngeal Swab; Nasopharyngeal(NP) swabs in vial transport medium  Result Value Ref Range Status   SARS Coronavirus 2 by RT PCR NEGATIVE NEGATIVE Final    Comment: (NOTE) SARS-CoV-2 target nucleic acids are NOT DETECTED.  The SARS-CoV-2 RNA is generally detectable in upper respiratory specimens during the acute phase of infection. The lowest concentration of SARS-CoV-2 viral copies this assay can detect is 138 copies/mL. Keylan Costabile negative result does not preclude SARS-Cov-2 infection and should not be used as the sole basis for treatment or other patient management decisions. Jlynn Ly negative result may occur with  improper specimen collection/handling, submission of specimen other than nasopharyngeal swab, presence of viral mutation(s) within the areas targeted by this assay, and inadequate number of viral copies(<138 copies/mL). Bobbye Petti negative result must be combined with clinical observations, patient history, and epidemiological information. The expected result is Negative.  Fact Sheet for Patients:  EntrepreneurPulse.com.au  Fact Sheet for Healthcare Providers:  IncredibleEmployment.be  This test is no t yet approved or cleared by the [place] FDA and  has been authorized for detection and/or diagnosis of SARS-CoV-2 by FDA under an Emergency Use Authorization (EUA). This EUA will remain  in effect (meaning this test can be used) for the duration of the COVID-19 declaration under Section 564(b)(1) of the Act, 21 U.S.C.section 360bbb-3(b)(1), unless the authorization is terminated  or revoked sooner.       Influenza Sinead Hockman by PCR NEGATIVE NEGATIVE Final   Influenza B by PCR NEGATIVE NEGATIVE Final    Comment: (NOTE) The Xpert Xpress SARS-CoV-2/FLU/RSV plus assay is intended as an aid in the diagnosis of influenza from  Nasopharyngeal swab specimens and should not be used as [place]anan sole basis for treatment. Nasal washings and aspirates are unacceptable for Xpert Xpress SARS-CoV-2/FLU/RSV testing.  Fact Sheet for Patients: EntrepreneurPulse.com.au  Fact Sheet for Healthcare Providers: IncredibleEmployment.be  This test is not yet approved or cleared by the [place] FDA and has been authorized for detection and/or diagnosis of SARS-CoV-2 by FDA under an Emergency Use Authorization (EUA). This EUA will remain in effect (meaning this test can be used) for the duration of the COVID-19 declaration under Section 564(b)(1) of the Act, 21 U.S.C. section  360bbb-3(b)(1), unless the authorization is terminated or revoked.  Performed at [facility], [place] [address] [place], [place] [address]          Radiology Studies: CT ANGIO HEAD W OR WO CONTRAST  Result Date: 10/12/2020 CLINICAL DATA:  Delirium.  Cerebral aneurysm. EXAM: CT ANGIOGRAPHY HEAD AND NECK TECHNIQUE: Multidetector CT imaging of the head and neck was performed using the standard protocol during bolus administration of intravenous contrast. Multiplanar CT image reconstructions and MIPs were obtained to evaluate the vascular anatomy. Carotid stenosis measurements (when applicable) are obtained utilizing NASCET criteria, using the distal internal carotid diameter as the denominator. CONTRAST:  69mL OMNIPAQUE IOHEXOL 350 MG/ML SOLN COMPARISON:  CT head 10/11/2020 FINDINGS: CTA NECK FINDINGS Aortic arch: Standard branching. Imaged portion shows no evidence of aneurysm or dissection. No significant stenosis of the major arch vessel origins. Mild atherosclerotic disease aortic arch and proximal great vessels. Right carotid system: Mild atherosclerotic disease right carotid bifurcation. Negative for stenosis. Left carotid system: Mild atherosclerotic disease left carotid bifurcation. Negative for stenosis. Vertebral  arteries: Both vertebral arteries patent to the basilar. Mild calcific stenosis distal left vertebral artery at the skull base. Skeleton: Mild degenerative change cervical spine. No acute skeletal abnormality. Other neck: Negative Upper chest: Lung apices clear bilaterally. Review of the MIP images confirms the above findings CTA HEAD FINDINGS Anterior circulation: Cavernous carotid widely patent bilaterally. Anterior middle cerebral arteries normal bilaterally. No stenosis or aneurysm in the anterior circulation. Posterior circulation: Both vertebral arteries patent to the basilar. Mild stenosis distal left vertebral artery at the skull base. Right PICA patent. Left PICA not visualized. Basilar widely patent. AICA, superior cerebellar, and posterior cerebral arteries widely patent without stenosis. 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 Mildly lobulated or margins. Aneurysm measures approximately 8 x 9 mm in diameter. No evidence of rupture or thrombus. Venous sinuses: Normal venous enhancement Anatomic variants: None Review of the MIP images confirms the above findings IMPRESSION: 1. 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 No other aneurysm 2. No intracranial stenosis or large vessel occlusion 3. No significant carotid or vertebral artery stenosis. Electronically Signed   By: Franchot Gallo M.D.   On: 10/12/2020 11:38   CT HEAD WO CONTRAST  Result Date: 10/12/2020 CLINICAL DATA:  Delirium EXAM: CT HEAD WITHOUT CONTRAST TECHNIQUE: Contiguous axial images were obtained from the base of the skull through the vertex without intravenous contrast. COMPARISON:  None. FINDINGS: Brain: Cerebral ventricle sizes are concordant with the degree of cerebral volume loss. Patchy and confluent areas of decreased attenuation are noted throughout the deep and periventricular white matter of the cerebral hemispheres bilaterally, compatible with chronic microvascular ischemic  disease. No evidence of large-territorial acute infarction. No parenchymal hemorrhage. No mass lesion. No extra-axial collection. No mass effect or midline shift. No hydrocephalus. Basilar cisterns are patent. Vascular: Aneurysmal dilatation of the basilar artery (6:26, 3:11). Skull: Heterogeneous appearance of the osseous structures. No acute fracture or focal lesion. Sinuses/Orbits: Paranasal sinuses and mastoid air cells are clear. The orbits are unremarkable. Other: None. IMPRESSION: 1. Aneurysmal dilatation of the basilar artery. Recommend CT angioagraphy for further evaluation. 2. Nonspecific heterogeneous appearance of the osseous structures. 3. No acute intracranial abnormality. These results will be called to the ordering clinician or representative by the Radiologist Assistant, and communication documented in the PACS or [employer]. Electronically Signed   By: Iven Finn M.D.   On: 10/12/2020 00:00   CT  ANGIO NECK W OR WO CONTRAST  Result Date: 10/12/2020 CLINICAL DATA:  Delirium.  Cerebral aneurysm. EXAM: CT ANGIOGRAPHY HEAD AND NECK TECHNIQUE: Multidetector CT imaging of the head and neck was performed using the standard protocol during bolus administration of intravenous contrast. Multiplanar CT image reconstructions and MIPs were obtained to evaluate the vascular anatomy. Carotid stenosis measurements (when applicable) are obtained utilizing NASCET criteria, using the distal internal carotid diameter as the denominator. CONTRAST:  82mL OMNIPAQUE IOHEXOL 350 MG/ML SOLN COMPARISON:  CT head 10/11/2020 FINDINGS: CTA NECK FINDINGS Aortic arch: Standard branching. Imaged portion shows no evidence of aneurysm or dissection. No significant stenosis of the major arch vessel origins. Mild atherosclerotic disease aortic arch and proximal great vessels. Right carotid system: Mild atherosclerotic disease right carotid bifurcation. Negative for stenosis. Left carotid system: Mild atherosclerotic  disease left carotid bifurcation. Negative for stenosis. Vertebral arteries: Both vertebral arteries patent to the basilar. Mild calcific stenosis distal left vertebral artery at the skull base. Skeleton: Mild degenerative change cervical spine. No acute skeletal abnormality. Other neck: Negative Upper chest: Lung apices clear bilaterally. Review of the MIP images confirms the above findings CTA HEAD FINDINGS Anterior circulation: Cavernous carotid widely patent bilaterally. Anterior middle cerebral arteries normal bilaterally. No stenosis or aneurysm in the anterior circulation. Posterior circulation: Both vertebral arteries patent to the basilar. Mild stenosis distal left vertebral artery at the skull base. Right PICA patent. Left PICA not visualized. Basilar widely patent. AICA, superior cerebellar, and posterior cerebral arteries widely patent without stenosis. 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 Mildly lobulated or margins. Aneurysm measures approximately 8 x 9 mm in diameter. No evidence of rupture or thrombus. Venous sinuses: Normal venous enhancement Anatomic variants: None Review of the MIP images confirms the above findings IMPRESSION: 1. 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 No other aneurysm 2. No intracranial stenosis or large vessel occlusion 3. No significant carotid or vertebral artery stenosis. Electronically Signed   By: Franchot Gallo M.D.   On: 10/12/2020 11:38   DG CHEST PORT 1 VIEW  Result Date: 10/12/2020 CLINICAL DATA:  Shortness of breath. EXAM: PORTABLE CHEST 1 VIEW COMPARISON:  07/18/2013 FINDINGS: Mild cardiomegaly noted as well as pulmonary vascular congestion. Infiltrate or atelectasis is seen at the right lung base, and small right pleural effusion cannot be excluded. Left lung is clear. Multiple old right rib fracture deformities are noted, as well as right shoulder prosthesis. IMPRESSION: Right basilar atelectasis versus  infiltrate, and possible small right pleural effusion. Mild cardiomegaly and pulmonary vascular congestion. Electronically Signed   By: Marlaine Hind M.D.   On: 10/12/2020 08:21        Scheduled Meds: . amLODipine  5 mg Oral Daily  . cholecalciferol  1,000 Units Oral Daily  . citalopram  20 mg Oral Daily  . diclofenac Sodium  2 g Topical QID  . docusate sodium  100 mg Oral Daily  . feeding supplement  237 mL Oral BID BM  . hydrALAZINE  25 mg Oral TID  . levothyroxine  100 mcg Oral Q0600  . lisinopril  40 mg Oral Daily  . methylPREDNISolone (SOLU-MEDROL) injection  40 mg Intravenous Q8H  . metoprolol tartrate  25 mg Oral BID  . omega-3 acid ethyl esters  1 g Oral Daily  . pantoprazole  40 mg Oral QHS   Continuous Infusions:   LOS: 4 days    Time spent: over 30 min    Fayrene Helper,  MD [facility]   To contact the attending provider between 7A-7P or the covering provider during after hours 7P-7A, please log into the web site [email or web address] and access using universal [place] password for that web site. If you do not have the password, please call the hospital operator.  10/12/2020, 2:29 PM

## 2020-10-13 ENCOUNTER — Inpatient Hospital Stay (HOSPITAL_COMMUNITY): Payer: Medicare Other

## 2020-10-13 LAB — COMPREHENSIVE METABOLIC PANEL
ALT: 14 U/L (ref 0–44)
AST: 24 U/L (ref 15–41)
Albumin: 3.2 g/dL — ABNORMAL LOW (ref 3.5–5.0)
Alkaline Phosphatase: 77 U/L (ref 38–126)
Anion gap: 13 (ref 5–15)
BUN: 16 mg/dL (ref 8–23)
CO2: 31 mmol/L (ref 22–32)
Calcium: 9.5 mg/dL (ref 8.9–10.3)
Chloride: 88 mmol/L — ABNORMAL LOW (ref 98–111)
Creatinine, Ser: 0.62 mg/dL (ref 0.44–1.00)
GFR, Estimated: >60 mL/min (ref >60)
Glucose, Bld: 105 mg/dL — ABNORMAL HIGH (ref 70–99)
Potassium: 4.4 mmol/L (ref 3.5–5.1)
Sodium: 132 mmol/L — ABNORMAL LOW (ref 135–145)
Total Bilirubin: 0.9 mg/dL (ref 0.3–1.2)
Total Protein: 5.7 g/dL — ABNORMAL LOW (ref 6.5–8.1)

## 2020-10-13 LAB — CBC
HCT: 32.2 % — ABNORMAL LOW (ref 36.0–46.0)
Hemoglobin: 11.9 g/dL — ABNORMAL LOW (ref 12.0–15.0)
MCH: 33.5 pg (ref 26.0–34.0)
MCHC: 37 g/dL — ABNORMAL HIGH (ref 30.0–36.0)
MCV: 90.7 fL (ref 80.0–100.0)
Platelets: 192 10*3/uL (ref 150–400)
RBC: 3.55 MIL/uL — ABNORMAL LOW (ref 3.87–5.11)
RDW: 13.1 % (ref 11.5–15.5)
WBC: 8.9 10*3/uL (ref 4.0–10.5)
nRBC: 0.2 % (ref 0.0–0.2)

## 2020-10-13 LAB — GLUCOSE, CAPILLARY
Glucose-Capillary: 100 mg/dL — ABNORMAL HIGH (ref 70–99)
Glucose-Capillary: 130 mg/dL — ABNORMAL HIGH (ref 70–99)
Glucose-Capillary: 150 mg/dL — ABNORMAL HIGH (ref 70–99)

## 2020-10-13 LAB — MAGNESIUM: Magnesium: 1.6 mg/dL — ABNORMAL LOW (ref 1.7–2.4)

## 2020-10-13 LAB — HEPARIN LEVEL (UNFRACTIONATED)
Heparin Unfractionated: 0.3 IU/mL (ref 0.30–0.70)
Heparin Unfractionated: 0.32 IU/mL (ref 0.30–0.70)

## 2020-10-13 IMAGING — MR MR CERVICAL SPINE W/O CM
5 series · 39 of 48 positions shown · non-contrast
Comparison: Thoracic MRI [DATE]

CLINICAL DATA: Neck and back pain after fall.  [facility].

EXAM:
MRI CERVICAL SPINE WITHOUT CONTRAST
TECHNIQUE: Multiplanar, multisequence MR imaging of the cervical spine was
performed. No intravenous contrast was administered.

[Series 5: T2 · sagittal · 3.0mm · 0.69mm/px · 6 of 15 slices shown (1 of 2)]
[im 1/15]
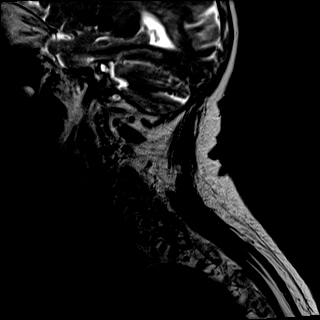
[im 3/15]
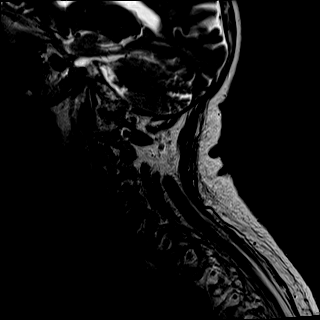
[im 6/15]
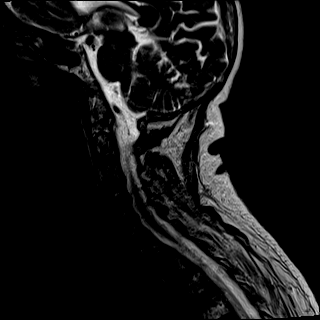
[im 9/15]
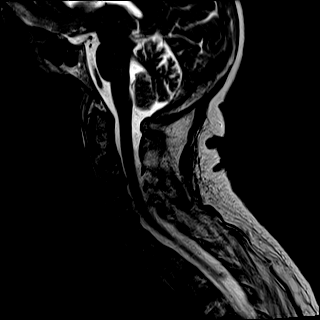
[im 12/15]
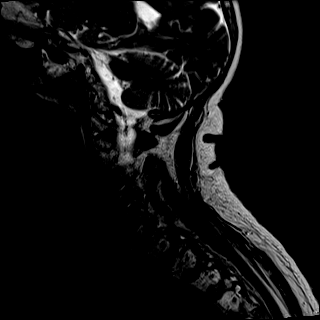
[im 15/15]
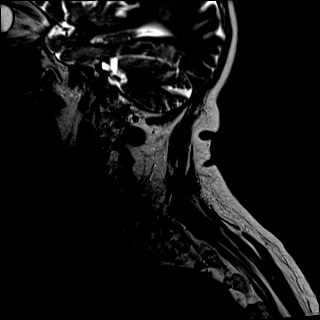

[Series 6: T1 · sagittal · 3.0mm · 0.69mm/px · 6 of 15 slices shown]
[im 1/15]
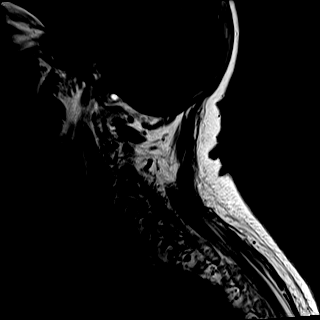
[im 3/15]
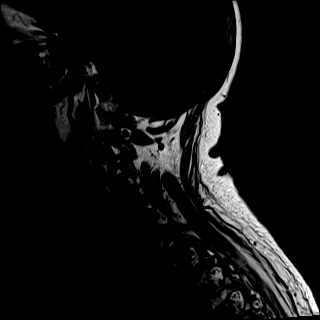
[im 6/15]
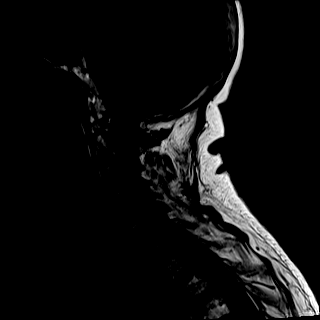
[im 9/15]
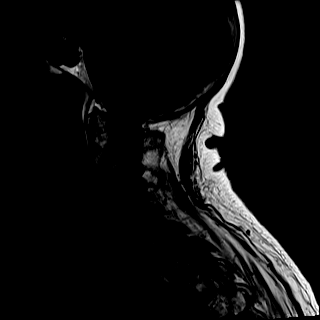
[im 12/15]
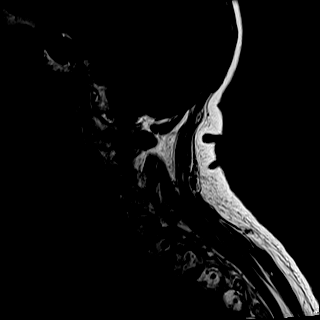
[im 15/15]
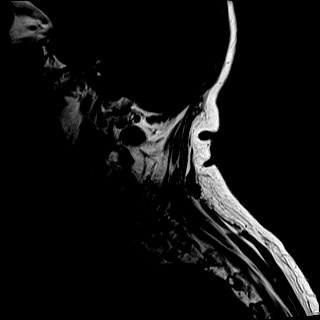

[Series 7: STIR · sagittal · 3.0mm · 0.86mm/px · 6 of 15 slices shown]
[im 1/15]
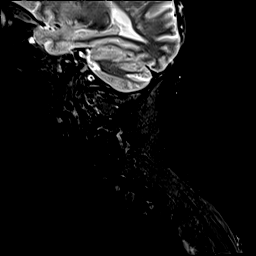
[im 3/15]
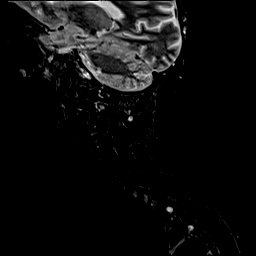
[im 6/15]
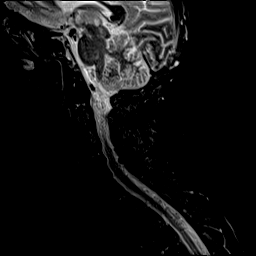
[im 9/15]
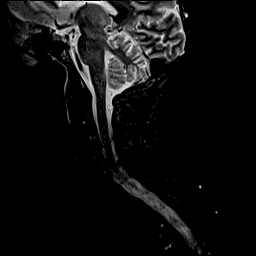
[im 12/15]
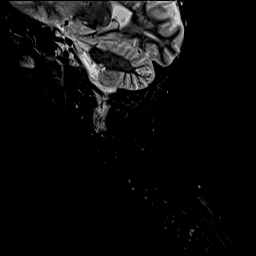
[im 15/15]
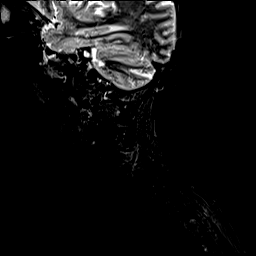

[Series 8: T2 · axial · 3.0mm · 0.66mm/px · z∈[-153,-34]mm · 13 of 40 slices shown (2 of 2)]
[im 1/40]
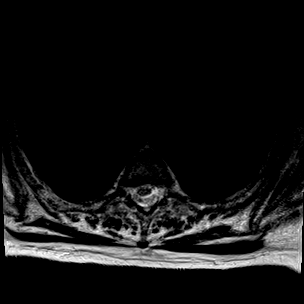
[im 3/40]
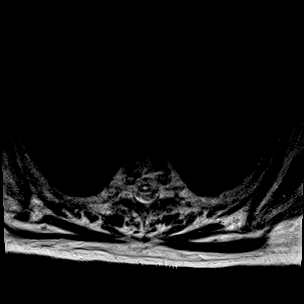
[im 6/40]
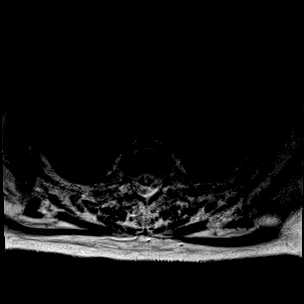
[im 9/40]
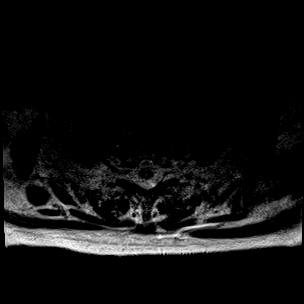
[im 12/40]
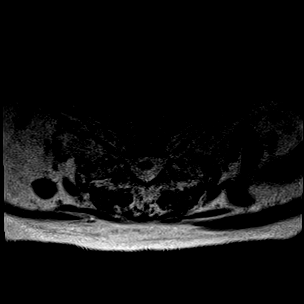
[im 14/40]
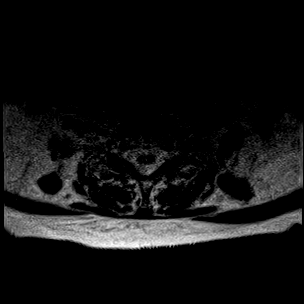
[im 17/40]
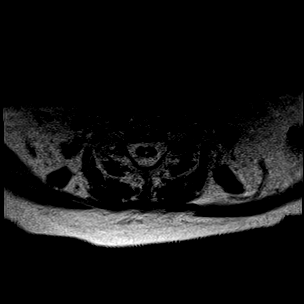
[im 20/40]
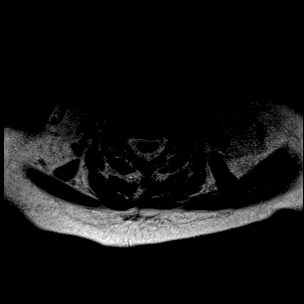
[im 23/40]
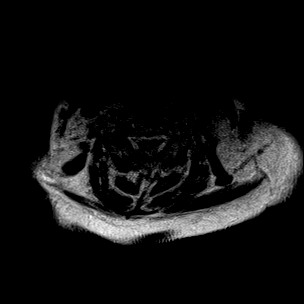
[im 26/40]
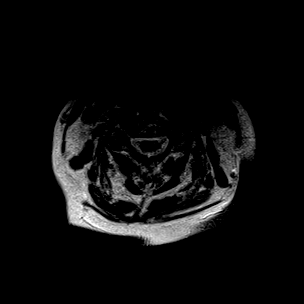
[im 28/40]
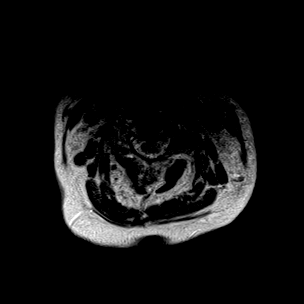
[im 34/40]
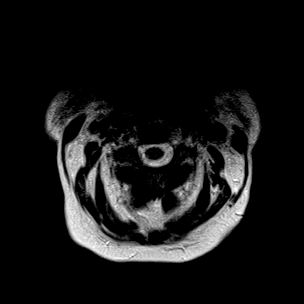
[im 40/40]
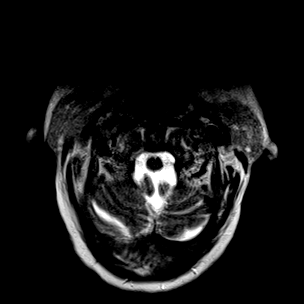

[Series 9: GRE · axial · 3.0mm · 0.39mm/px · z∈[-153,-34]mm · 8 of 40 slices shown]
[im 1/40]
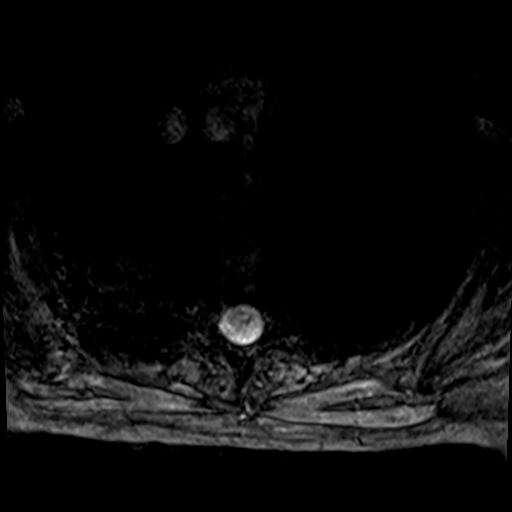
[im 6/40]
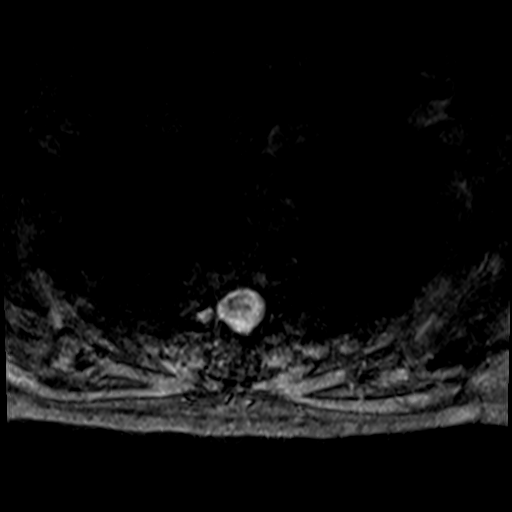
[im 12/40]
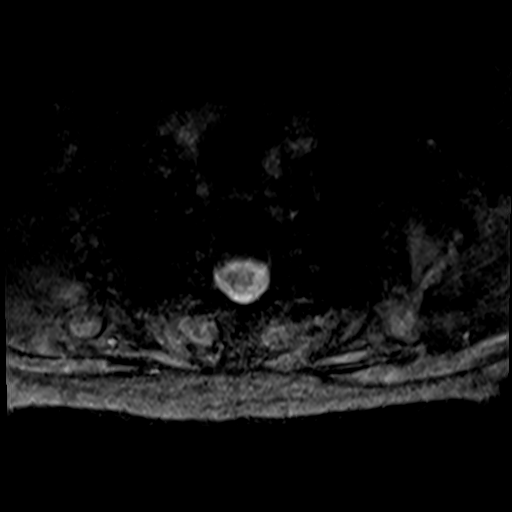
[im 17/40]
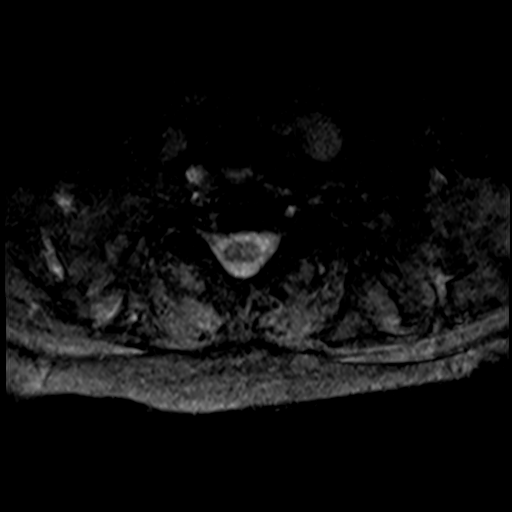
[im 23/40]
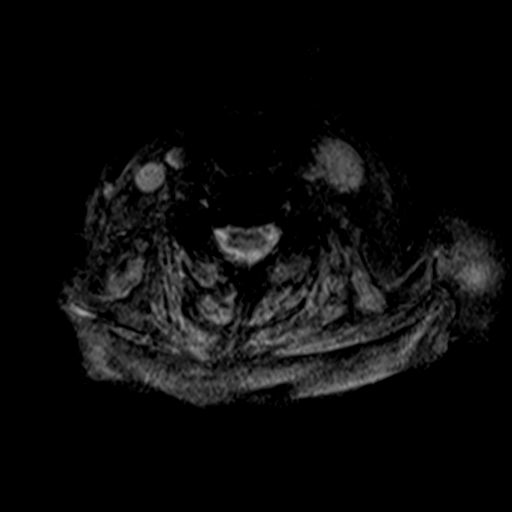
[im 28/40]
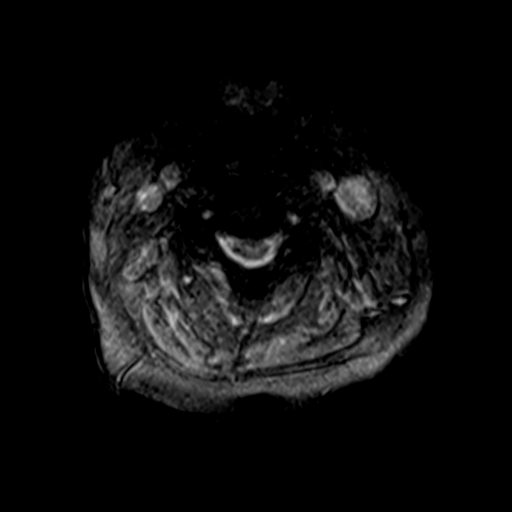
[im 34/40]
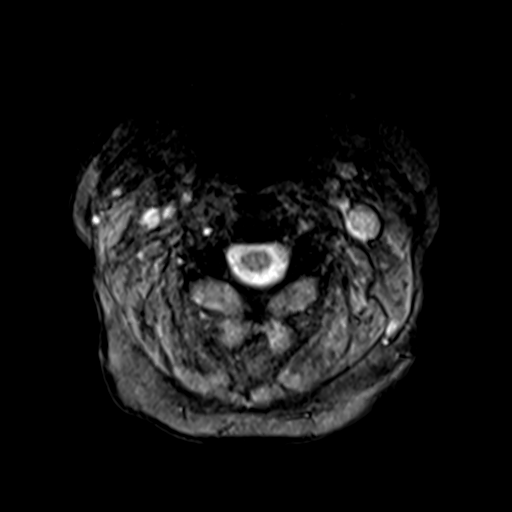
[im 40/40]
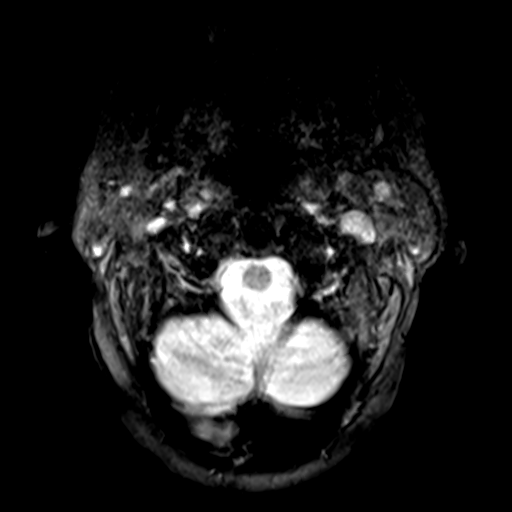

[39 of 48 positions shown; findings below may reference images not displayed]

FINDINGS: Alignment: Slight C6-7 anterolisthesis.

Vertebrae: No fracture, evidence of discitis, or bone lesion.

Cord: Hydromyelia seen from C4-5 into the thoracic spine, measuring
up to 2-3 mm in the thoracic spine. No underlying cord swelling,
mass, or impingement.

Posterior Fossa, vertebral arteries, paraspinal tissues: Mild T2
hyperintensity in the pons attributed to chronic small vessel
ischemia.

Disc levels:

Disc narrowing and bulging especially at C3-4 to C5-6. Mild facet
spurring and ligamentum flavum buckling at the same levels.
Diffusely patent spinal canal.
IMPRESSION: Cervicothoracic hydromyelia measuring up to 2-3 mm in diameter. No
underlying cord lesion, compression, or edema.

Ordinary cervical spine degeneration.

## 2020-10-13 IMAGING — CT CT ANGIO CHEST
2 of 6 series · 19 of 36 positions shown · IV contrast (omnipaque)
Comparison: None.

CLINICAL DATA: Positive D-dimer.  Shortness of breath

EXAM:
CT ANGIOGRAPHY CHEST WITH CONTRAST
TECHNIQUE: Multidetector CT imaging of the chest was performed using the
standard protocol during bolus administration of intravenous
contrast. Multiplanar CT image reconstructions and MIPs were
obtained to evaluate the vascular anatomy.
CONTRAST:  50mL OMNIPAQUE IOHEXOL 350 MG/ML SOLN

[Series 7: pe thins · axial · 0.70mm/px · z∈[+1140,+1377]mm · 18 of 376 slices shown]
[im 19/376  lung]
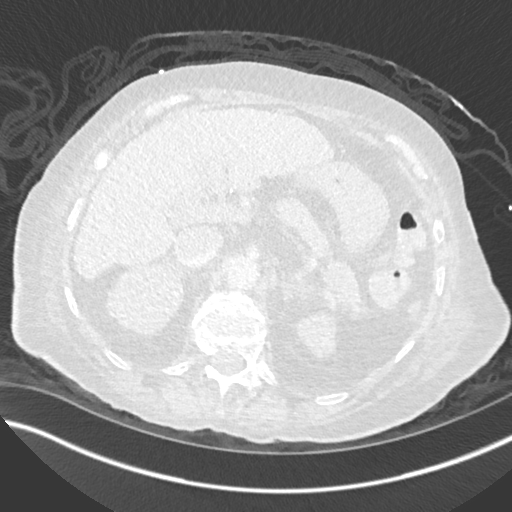
[im 38/376  mediastinal]
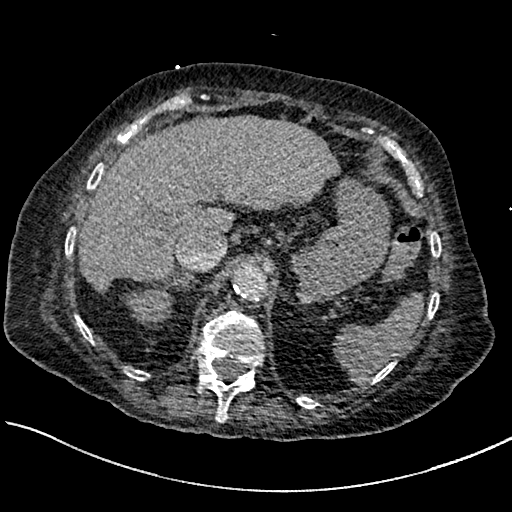
[im 57/376  lung]
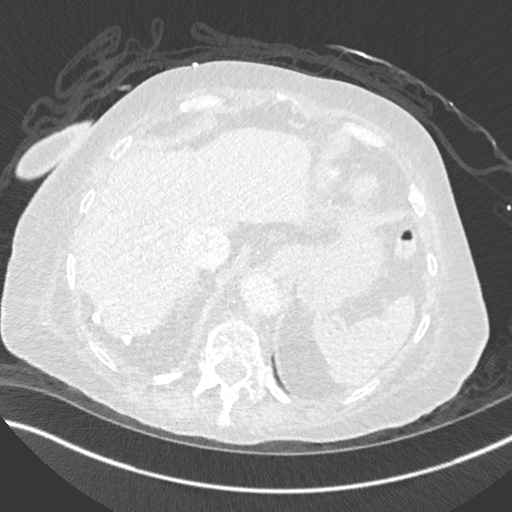
[im 76/376  mediastinal]
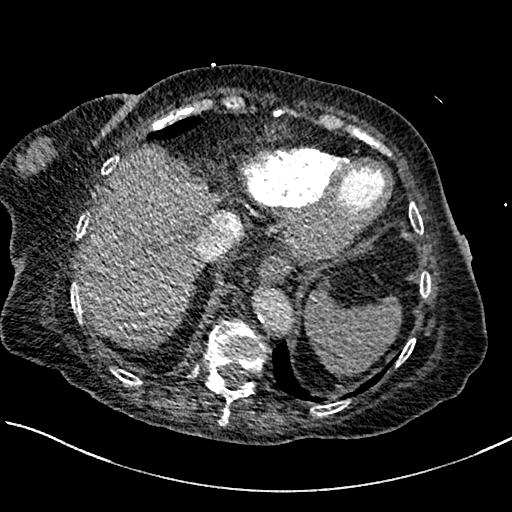
[im 94/376  lung]
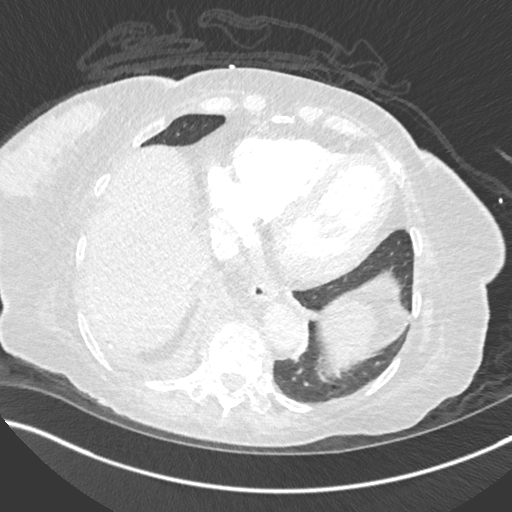
[im 113/376  mediastinal]
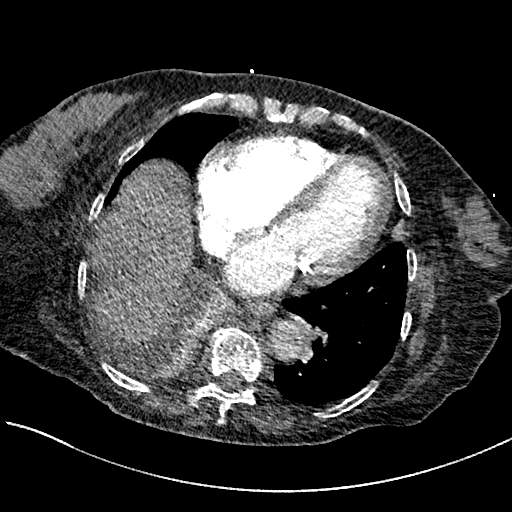
[im 132/376  lung]
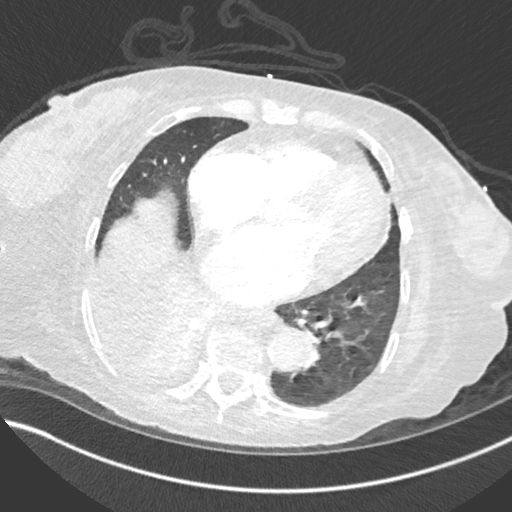
[im 151/376  mediastinal]
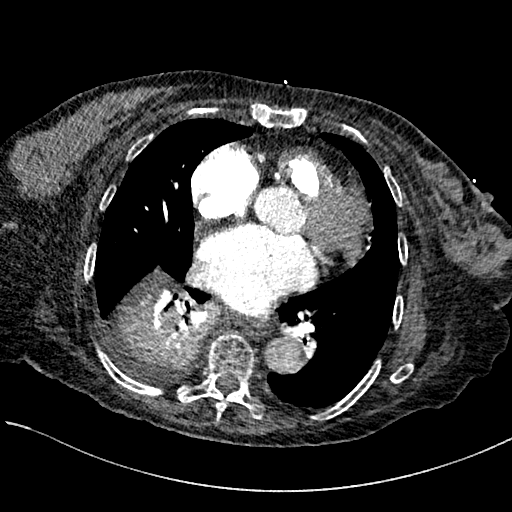
[im 169/376  lung]
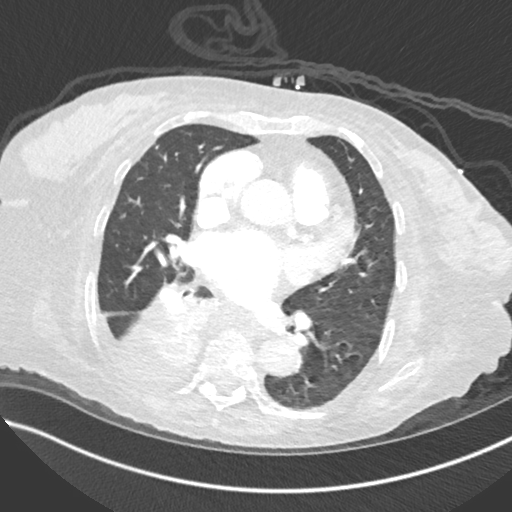
[im 207/376  mediastinal]
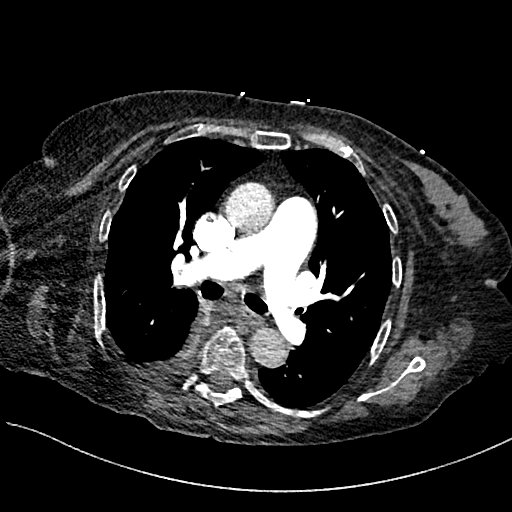
[im 226/376  lung]
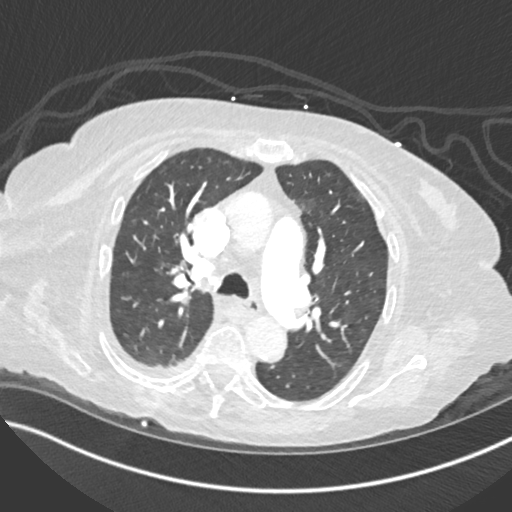
[im 244/376  mediastinal]
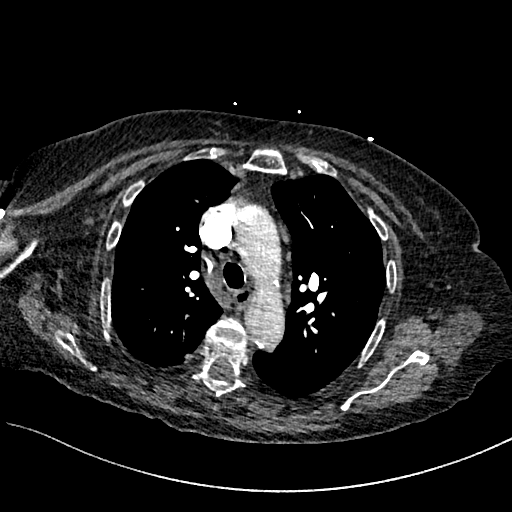
[im 263/376  lung]
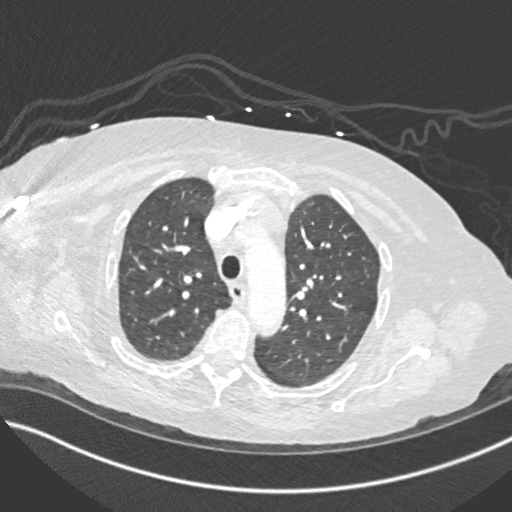
[im 282/376  mediastinal]
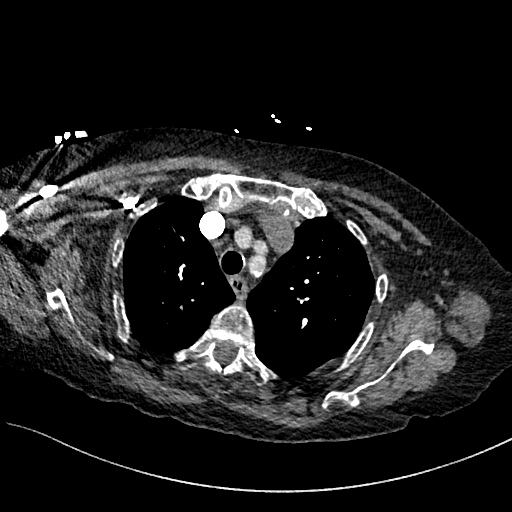
[im 301/376  lung]
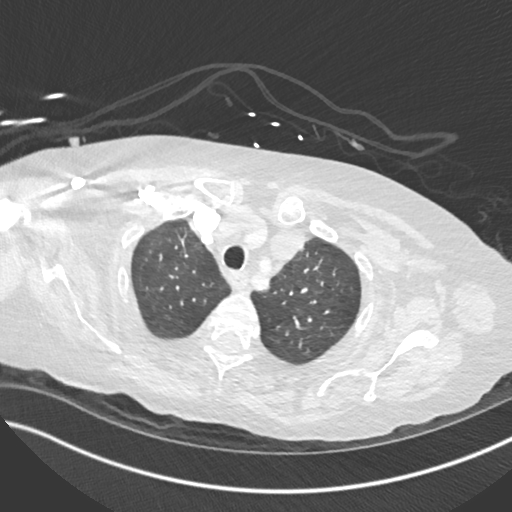
[im 319/376  mediastinal]
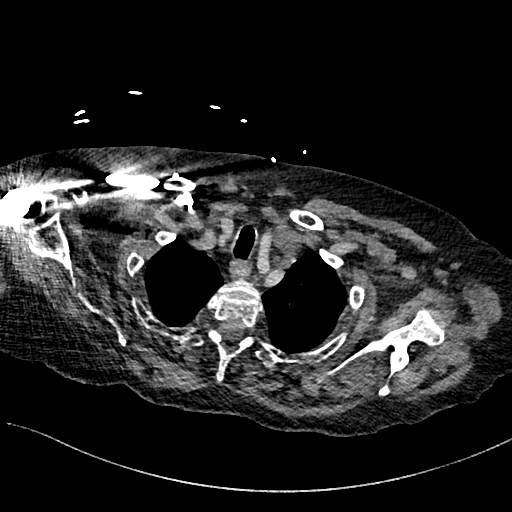
[im 338/376  lung]
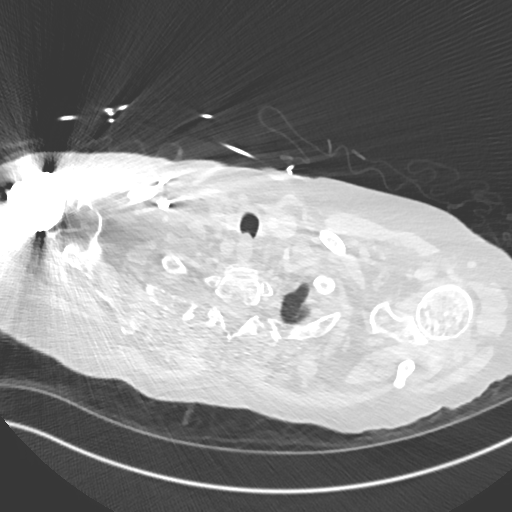
[im 357/376  mediastinal]
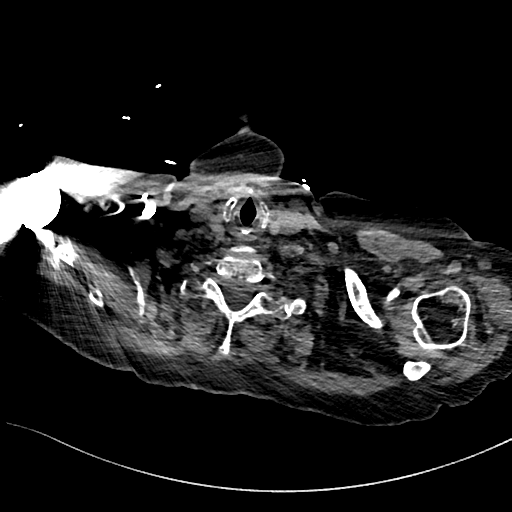

[Series 8: pe 2mm cor · coronal · 0.59mm/px · 1 of 151 slices shown]
[im 76/151  mediastinal]
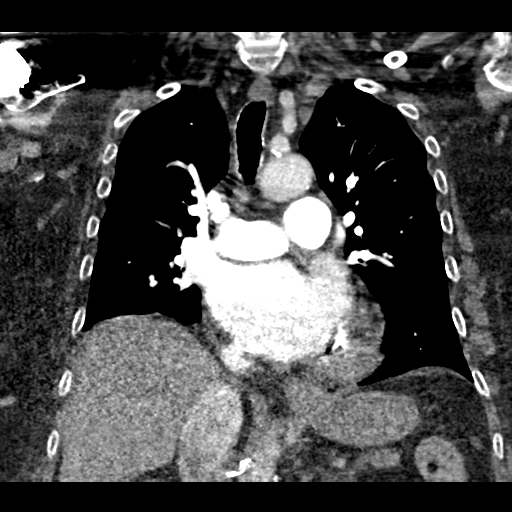

[19 of 36 positions shown; findings below may reference images not displayed]

FINDINGS: Cardiovascular: No filling defects in the pulmonary arteries to
suggest pulmonary emboli. Heart is mildly enlarged. Aorta normal
caliber. Scattered aortic calcifications.

Mediastinum/Nodes: No mediastinal, hilar, or axillary adenopathy.
Trachea and esophagus are unremarkable. Thyroid unremarkable.

Lungs/Pleura: Small right pleural effusion. Atelectasis or
consolidation in the right lower lobe. No confluent opacity on the
left.

Upper Abdomen: Nodular contours of the liver suggest cirrhosis.

Musculoskeletal: Chest wall soft tissues are unremarkable. Multiple
compression fractures in the mid to lower thoracic spine and upper
lumbar spine.

Review of the MIP images confirms the above findings.
IMPRESSION: Cardiomegaly.

No evidence of pulmonary embolus.

Small right pleural effusion with right lower lobe atelectasis or
infiltrate.

Multiple compression fractures in the mid to lower thoracic spine
and upper lumbar spine.

Suspect changes of cirrhosis.

Aortic Atherosclerosis ([4Y]-[4Y]).

## 2020-10-13 MED ORDER — MAGNESIUM SULFATE 2 GM/50ML IV SOLN
2.0000 g | Freq: Once | INTRAVENOUS | Status: AC
  Administered 2020-10-13: 2 g via INTRAVENOUS
  Filled 2020-10-13: qty 50

## 2020-10-13 MED ORDER — PREDNISONE 20 MG PO TABS
40.0000 mg | ORAL_TABLET | Freq: Every day | ORAL | Status: AC
  Administered 2020-10-14 – 2020-10-15 (×2): 40 mg via ORAL
  Filled 2020-10-13 (×2): qty 2

## 2020-10-13 MED ORDER — IOHEXOL 350 MG/ML SOLN
50.0000 mL | Freq: Once | INTRAVENOUS | Status: AC | PRN
  Administered 2020-10-13: 50 mL via INTRAVENOUS

## 2020-10-13 NOTE — Progress Notes (Signed)
ANTICOAGULATION CONSULT NOTE - Follow Up Consult  Pharmacy Consult for Heparin Indication: atrial fibrillation  No Active Allergies  Patient Measurements: Height: 5\' 4"  (162.6 cm) Weight: 81.4 kg (179 lb 7.3 oz) IBW/kg (Calculated) : 54.7 Heparin Dosing Weight: 72 kg  Vital Signs: Temp: 97.9 F (36.6 C) (04/21 1119) Temp Source: Oral (04/21 1119) BP: 126/77 (04/21 1119) Pulse Rate: 94 (04/21 1119)  Labs: Recent Labs    10/11/20 0403 10/11/20 2302 10/12/20 0802 10/13/20 0153 10/13/20 1033  HGB 11.5* 12.6 12.3 11.9*  --   HCT 31.2* 33.8* 33.4* 32.2*  --   PLT 195 213 234 192  --   LABPROT 12.9  --   --   --   --   INR 1.0  --   --   --   --   HEPARINUNFRC  --   --   --  0.30 0.32  CREATININE 0.48 0.49 0.52 0.62  --     Estimated Creatinine Clearance: 66.6 mL/min (by C-G formula based on SCr of 0.62 mg/dL).  Assessment: 70 yr old female admitted on 10/08/20 with back/abd pain, along with hypokalemia (2.5) and hypomag (1.1). Pt with new dx of afib with RVR. Plan for kyphoplasty; pharmacy was consulted to dose IV heparin for atrial fibrillation. Of note, pt with cirrhosis and portal hypertension. Pt was on no anticoagulation PTA. LFTs normal.    Initial heparin level low therapeutic (0.30) and remains low therapeutic (0.32) on 1000 units/hr. CBC stable.  Goal of Therapy:  Heparin level 0.3-0.7 units/ml Monitor platelets by anticoagulation protocol: Yes   Plan:   Continue heparin drip at 1000 units/hr.  Daily heparin level and CBC.  Follow up timing of kyphoplasty and holding heparin for procedure.  [name], [place] 10/13/2020,11:54 AM

## 2020-10-13 NOTE — Progress Notes (Addendum)
PROGRESS NOTE    [name]  TZG:[id number] DOB: 30-Jul-1950 DOA: 10/08/2020 PCP: Taylor Han, MD   Chief Complaint  Patient presents with  . Back Pain  . Abdominal Pain   Brief Narrative:  70 year old with history of HTN, hypothyroidism, GERD, liver cirrhosis presents with abdominal and low back pain.  Upon admission patient had hyponatremia, hypokalemia, hypomagnesemia CT abdomen pelvis showed cirrhosis with portal hypertension, right pleural effusion with interval development of T10, T8 T12 and L1 compression fracture.  MRI confirmed these findings.  IR consulted who recommended kyphoplasty. Plan pending improvement in afib with RVR.   Assessment & Plan:   Principal Problem:   Back pain Active Problems:   HYPERTENSION, BENIGN ESSENTIAL   Hyponatremia   Hypothyroidism   Hypokalemia due to excessive gastrointestinal loss of potassium   Abdominal pain   Hypomagnesemia   Compression fracture of body of thoracic vertebra (HCC)   History of cirrhosis of liver  Atrial Fibrillation with RVR No prior hx, appears new onset Follow TSH (on low side, synthroid reduced), echo - will follow LE [place] (negative) and consider CT PE protocol given O2 requirement Has elevated BNP as well chadvasc at least 3 IV metop prn.  Oral metop scheduled. Will ask cardiology to see as well - appreciate assistance - rate improved with dilt gtt, continue heparin gtt  Acute Metabolic Encephalopathy Suspect this is 2/2 hospital delirium in setting of opiate use Blood gas with normal pCO2 TSH low, synthroid reduced.  Follow b12 (wnl), folate (wnl), ammonia (wnl).  Delirium precautions Improved today, though per discussion with daughter, this continues to wax/wane  Back pain due Thoracic compression fracture -Significant amount of pain.  CT abdomen pelvis shows compression fracture T10, T12 and L1 -Pain control-oxycodone IR 10 mg and IV morphine as needed.  Bowel regimen. -IR consulted, MRI  performed -> acute compression fx involving the T10 vertebral body with up to 60% height loss and 4 mm bony retropulsion, no significant spinal stenosis, acute compression fx involving T9 vertebral body with up to 25% height loss without significant bony retropulsion, late to subacute to chronic severe compression fractures involving the T12 and L1 vertebral bodies with 80-90% height loss and up to 4 mm bony retropulsion, no significant spinal stenosis.  Small syrinx measuring up to 3 mm involving the majority of the thoracic spinal cord, terminating at T8-9.   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 Kathyrn Murray  Abdominal pain, improved Liver cirrhosis with history of alcohol use History of cholecystectomy -Continue PPI.  Pain control -LFTs relatively normal, lipase normal -Right upper quadrant ultrasound-showed cirrhotic pathology -Acute hepatitis-negative -Echo- EF 55 to 60%, grade 1 DD, severely elevated pulmonary artery pressure - Outpatient GI follow-up, notified about her GI to arrange for follow-up -Endoscopy 06/2018- showed gastritis and multiple duodenal ulcer secondary to NSAID  Vitamin D deficiency - Supplement started  Daily tobacco use with history of COPD, mild exacerbation - steroids, bronchodilators, I-S, flutter valve -- discussed smoking cessation and discussed possibilities of meds for assistance with cessation, she declined  Hyponatremia -Secondary to cirrhosis.  Initially due to  intravascular volume depletion required fluids with due to  signs of volume overload and has been stopped for now  Hypokalemia, resolved - replace and follow as needed  Hypomagnesemia - replace and follow as needed  Hypothyroidism -Low TSH elevated T4.  Reduced to Synthroid 100 mcg daily  Essential hypertension: UnControlled -Continue lisinopril 40 mg daily.  Increase hydralazine 25 mg 3 times daily.  Add Norvasc 5 mg daily  Hyperlipidemia -Continue Lovaza  Hyperglycemia Follow a1c 4.7  DVT prophylaxis: SCD Code Status: full  Family Communication: none at bedside - daughter over phone Disposition:   Status is: Inpatient  Remains inpatient appropriate because:Inpatient level of care appropriate due to severity of illness   Dispo: The patient is from: Home              Anticipated d/c is to: Home              Patient currently is not medically stable to d/c.   Difficult to place patient No       Consultants:   IR  cardiology  Procedures none  Antimicrobials: Anti-infectives (From admission, onward)   Start     Dose/Rate Route Frequency Ordered Stop   10/11/20 0600  ceFAZolin (ANCEF) IVPB 2g/100 mL premix        2 g 200 mL/hr over 30 Minutes Intravenous To Radiology 10/10/20 1448 10/12/20 0600         Subjective: Continued back pain with activity  Objective: Vitals:   10/13/20 0418 10/13/20 0834 10/13/20 1040 10/13/20 1119  BP: (!) 155/96 129/82 (!) 157/96 126/77  Pulse: 90 80 88 94  Resp: 20   20  Temp: 97.7 F (36.5 C) 98 F (36.7 C)  97.9 F (36.6 C)  TempSrc: Oral Oral  Oral  SpO2: 98% 100%  100%  Weight:      Height:        Intake/Output Summary (Last 24 hours) at 10/13/2020 1425 Last data filed at 10/13/2020 1058 Gross per 24 hour  Intake 632.09 ml  Output --  Net 632.09 ml   Filed Weights   10/11/20 0349 10/12/20 0448 10/13/20 0404  Weight: 84.2 kg 79 kg 81.4 kg    Examination:  General: No acute distress. Cardiovascular: Heart sounds show [name] regular rate, and rhythm.  Lungs:  Clear to auscultation bilaterally  Abdomen: Soft, nontender, nondistended  Neurological: Alert and oriented 3. Moves all extremities 4. Cranial nerves II through XII grossly intact. Skin: Warm and dry. No rashes or lesions. Extremities: No clubbing or cyanosis. No edema.    Data Reviewed: I have personally reviewed following labs and imaging studies  CBC: Recent Labs  Lab 10/08/20 1641 10/09/20 0020 10/10/20 0346 10/11/20 0403 10/11/20 2302 10/12/20 0802 10/13/20 0153  WBC 8.7   < > 7.2 10.5 7.9 8.8 8.9  NEUTROABS 7.1  --   --  8.2*  --  8.2*  --   HGB 14.0   < > 11.6* 11.5* 12.6 12.3 11.9*  HCT RESULTS UNAVAILABLE DUE TO INTERFERING SUBSTANCE   < > RESULTS UNAVAILABLE DUE TO INTERFERING SUBSTANCE 31.2* 33.8* 33.4* 32.2*  MCV RESULTS UNAVAILABLE DUE TO INTERFERING SUBSTANCE   < > RESULTS UNAVAILABLE DUE TO INTERFERING SUBSTANCE 90.4 89.4 89.5 90.7  PLT 213   < > 160 195 213 234 192   < > = values in this interval not displayed.    Basic Metabolic Panel: Recent Labs  Lab 10/09/20 0020 10/09/20 0807 10/10/20 0346 10/11/20 0403 10/11/20 2302 10/12/20  0802 10/13/20 0153  NA 124*   < > 125* 127* 129* 131* 132*  K 3.6   < > 4.3 3.9 4.0 4.0 4.4  CL 85*   < > 89* 90* 86* 88* 88*  CO2 29   < > 31 32 34* 35* 31  GLUCOSE 106*   < > 106* 97 135* 140* 105*  BUN <5*   < > 7* 10 11 11 16   CREATININE 0.51   < > 0.63 0.48 0.49 0.52 0.62  CALCIUM 8.2*   < > 8.5* 8.9 9.1 9.4 9.5  MG 2.6*  --  1.8 1.6* 1.4*  --  1.6*  PHOS 3.4  --   --   --   --   --   --    < > = values in this interval not displayed.    GFR: Estimated Creatinine Clearance: 66.6 mL/min (by C-G formula based on SCr of 0.62 mg/dL).  Liver Function Tests: Recent Labs  Lab 10/10/20 0346 10/11/20 0403 10/11/20 2302 10/12/20 0802 10/13/20 0153  AST 18 17 21 22 24   ALT 16 13 14 14 14   ALKPHOS 101 101 97 98 77  BILITOT 0.9 0.7 0.6 1.0 0.9  PROT 5.3* 5.6* 6.0* 6.1* 5.7*  ALBUMIN 2.8* 3.1* 3.3* 3.4* 3.2*     CBG: Recent Labs  Lab 10/12/20 1118 10/12/20 1606 10/12/20 2353 10/13/20 0610 10/13/20 1118  GLUCAP 145* 142* 136* 100* 130*     Recent Results (from the past 240 hour(s))  Resp Panel by RT-PCR (Flu Colburn Asper&B, Covid) Nasopharyngeal Swab     Status: None   Collection Time: 10/08/20  5:33 PM   Specimen: Nasopharyngeal Swab; Nasopharyngeal(NP) swabs in vial transport medium  Result Value Ref Range Status   SARS Coronavirus 2 by RT PCR NEGATIVE NEGATIVE Final    Comment: (NOTE) SARS-CoV-2 target nucleic acids are NOT DETECTED.  The SARS-CoV-2 RNA is generally detectable in upper respiratory specimens during the acute phase of infection. The lowest concentration of SARS-CoV-2 viral copies this assay can detect is 138 copies/mL. Naviah Belfield negative result does not preclude SARS-Cov-2 infection and should not be used as the sole basis for treatment or other patient management decisions. Kyiah Canepa negative result may occur with  improper specimen collection/handling, submission of specimen other than nasopharyngeal swab, presence of viral mutation(s) within the areas targeted by this assay, and inadequate number of viral copies(<138 copies/mL). Aaidyn San negative result must be combined with clinical observations, patient history, and epidemiological information. The expected result is Negative.  Fact Sheet for Patients:  EntrepreneurPulse.com.au  Fact Sheet for Healthcare Providers:  IncredibleEmployment.be  This test is no t yet approved or cleared by the [place] FDA and  has been authorized for detection and/or diagnosis of SARS-CoV-2 by FDA under an Emergency Use Authorization (EUA). This EUA will remain  in effect (meaning this test can be used) for the duration of the COVID-19 declaration under Section 564(b)(1) of the Act, 21 U.S.C.section 360bbb-3(b)(1), unless the authorization is terminated  or revoked sooner.       Influenza Phelix Fudala by PCR NEGATIVE  NEGATIVE Final   Influenza B by PCR NEGATIVE NEGATIVE Final    Comment: (NOTE) The Xpert Xpress SARS-CoV-2/FLU/RSV plus assay is intended as an aid in the diagnosis of influenza from Nasopharyngeal swab specimens and should not be used as Kada Friesen sole basis for treatment. Nasal washings and aspirates are unacceptable for Xpert Xpress SARS-CoV-2/FLU/RSV testing.  Fact Sheet for Patients: EntrepreneurPulse.com.au  Fact Sheet for Healthcare  Providers: IncredibleEmployment.be  This test is not yet approved or cleared by the [place] and has been authorized for detection and/or diagnosis of SARS-CoV-2 by FDA under an Emergency Use Authorization (EUA). This EUA will remain in effect (meaning this test can be used) for the duration of the COVID-19 declaration under Section 564(b)(1) of the Act, 21 U.S.C. section 360bbb-3(b)(1), unless the authorization is terminated or revoked.  Performed at [facility], [place] [address] [place],  [address]          Radiology Studies: CT ANGIO HEAD W OR WO CONTRAST  Result Date: 10/12/2020 CLINICAL DATA:  Delirium.  Cerebral aneurysm. EXAM: CT ANGIOGRAPHY HEAD AND NECK TECHNIQUE: Multidetector CT imaging of the head and neck was performed using the standard protocol during bolus administration of intravenous contrast. Multiplanar CT image reconstructions and MIPs were obtained to evaluate the vascular anatomy. Carotid stenosis measurements (when applicable) are obtained utilizing NASCET criteria, using the distal internal carotid diameter as the denominator. CONTRAST:  17mL OMNIPAQUE IOHEXOL 350 MG/ML SOLN COMPARISON:  CT head 10/11/2020 FINDINGS: CTA NECK FINDINGS Aortic arch: Standard branching. Imaged portion shows no evidence of aneurysm or dissection. No significant stenosis of the major arch vessel origins. Mild atherosclerotic disease aortic arch and proximal great vessels. Right carotid system:  Mild atherosclerotic disease right carotid bifurcation. Negative for stenosis. Left carotid system: Mild atherosclerotic disease left carotid bifurcation. Negative for stenosis. Vertebral arteries: Both vertebral arteries patent to the basilar. Mild calcific stenosis distal left vertebral artery at the skull base. Skeleton: Mild degenerative change cervical spine. No acute skeletal abnormality. Other neck: Negative Upper chest: Lung apices clear bilaterally. Review of the MIP images confirms the above findings CTA HEAD FINDINGS Anterior circulation: Cavernous carotid widely patent bilaterally. Anterior middle cerebral arteries normal bilaterally. No stenosis or aneurysm in the anterior circulation. Posterior circulation: Both vertebral arteries patent to the basilar. Mild stenosis distal left vertebral artery at the skull base. Right PICA patent. Left PICA not visualized. Basilar widely patent. AICA, superior cerebellar, and posterior cerebral arteries widely patent without stenosis. 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 Mildly lobulated or margins. Aneurysm measures approximately 8 x 9 mm in diameter. No evidence of rupture or thrombus. Venous sinuses: Normal venous enhancement Anatomic variants: None Review of the MIP images confirms the above findings IMPRESSION: 1. 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 No other aneurysm 2. No intracranial stenosis or large vessel occlusion 3. No significant carotid or vertebral artery stenosis. Electronically Signed   By: Franchot Gallo M.D.   On: 10/12/2020 11:38   CT HEAD WO CONTRAST  Result Date: 10/12/2020 CLINICAL DATA:  Delirium EXAM: CT HEAD WITHOUT CONTRAST TECHNIQUE: Contiguous axial images were obtained from the base of the skull through the vertex without intravenous contrast. COMPARISON:  None. FINDINGS: Brain: Cerebral ventricle sizes are concordant with the degree of cerebral volume loss. Patchy and confluent  areas of decreased attenuation are noted throughout the deep and periventricular white matter of the cerebral hemispheres bilaterally, compatible with chronic microvascular ischemic disease. No evidence of large-territorial acute infarction. No parenchymal hemorrhage. No mass lesion. No extra-axial collection. No mass effect or midline shift. No hydrocephalus. Basilar cisterns are patent. Vascular: Aneurysmal dilatation of the basilar artery (6:26, 3:11). Skull: Heterogeneous appearance of the osseous structures. No acute fracture or focal lesion. Sinuses/Orbits: Paranasal sinuses and mastoid air cells are clear. The orbits are unremarkable. Other: None. IMPRESSION: 1. Aneurysmal dilatation of the  basilar artery. Recommend CT angioagraphy for further evaluation. 2. Nonspecific heterogeneous appearance of the osseous structures. 3. No acute intracranial abnormality. These results will be called to the ordering clinician or representative by the Radiologist Assistant, and communication documented in the PACS or [employer]. Electronically Signed   By: Iven Finn M.D.   On: 10/12/2020 00:00   CT ANGIO NECK W OR WO CONTRAST  Result Date: 10/12/2020 CLINICAL DATA:  Delirium.  Cerebral aneurysm. EXAM: CT ANGIOGRAPHY HEAD AND NECK TECHNIQUE: Multidetector CT imaging of the head and neck was performed using the standard protocol during bolus administration of intravenous contrast. Multiplanar CT image reconstructions and MIPs were obtained to evaluate the vascular anatomy. Carotid stenosis measurements (when applicable) are obtained utilizing NASCET criteria, using the distal internal carotid diameter as the denominator. CONTRAST:  43mL OMNIPAQUE IOHEXOL 350 MG/ML SOLN COMPARISON:  CT head 10/11/2020 FINDINGS: CTA NECK FINDINGS Aortic arch: Standard branching. Imaged portion shows no evidence of aneurysm or dissection. No significant stenosis of the major arch vessel origins. Mild atherosclerotic disease  aortic arch and proximal great vessels. Right carotid system: Mild atherosclerotic disease right carotid bifurcation. Negative for stenosis. Left carotid system: Mild atherosclerotic disease left carotid bifurcation. Negative for stenosis. Vertebral arteries: Both vertebral arteries patent to the basilar. Mild calcific stenosis distal left vertebral artery at the skull base. Skeleton: Mild degenerative change cervical spine. No acute skeletal abnormality. Other neck: Negative Upper chest: Lung apices clear bilaterally. Review of the MIP images confirms the above findings CTA HEAD FINDINGS Anterior circulation: Cavernous carotid widely patent bilaterally. Anterior middle cerebral arteries normal bilaterally. No stenosis or aneurysm in the anterior circulation. Posterior circulation: Both vertebral arteries patent to the basilar. Mild stenosis distal left vertebral artery at the skull base. Right PICA patent. Left PICA not visualized. Basilar widely patent. AICA, superior cerebellar, and posterior cerebral arteries widely patent without stenosis. 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 Mildly lobulated or margins. Aneurysm measures approximately 8 x 9 mm in diameter. No evidence of rupture or thrombus. Venous sinuses: Normal venous enhancement Anatomic variants: None Review of the MIP images confirms the above findings IMPRESSION: 1. 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 No other aneurysm 2. No intracranial stenosis or large vessel occlusion 3. No significant carotid or vertebral artery stenosis. Electronically Signed   By: Franchot Gallo M.D.   On: 10/12/2020 11:38   MR CERVICAL SPINE WO CONTRAST  Result Date: 10/13/2020 CLINICAL DATA:  Neck and back pain after fall.  Syrinx. EXAM: MRI CERVICAL SPINE WITHOUT CONTRAST TECHNIQUE: Multiplanar, multisequence MR imaging of the cervical spine was performed. No intravenous contrast was administered. COMPARISON:  Thoracic  MRI 10/09/2020 FINDINGS: Alignment: Slight C6-7 anterolisthesis. Vertebrae: No fracture, evidence of discitis, or bone lesion. Cord: Hydromyelia seen from C4-5 into the thoracic spine, measuring up to 2-3 mm in the thoracic spine. No underlying cord swelling, mass, or impingement. Posterior Fossa, vertebral arteries, paraspinal tissues: Mild T2 hyperintensity in the pons attributed to chronic small vessel ischemia. Disc levels: Disc narrowing and bulging especially at C3-4 to C5-6. Mild facet spurring and ligamentum flavum buckling at the same levels. Diffusely patent spinal canal. 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 No underlying cord lesion, compression, or edema. Ordinary cervical spine degeneration. Electronically Signed   By: Monte Fantasia M.D.   On: 10/13/2020 09:19   DG CHEST PORT 1 VIEW  Result Date: 10/12/2020 CLINICAL DATA:  Shortness of breath. EXAM:  PORTABLE CHEST 1 VIEW COMPARISON:  07/18/2013 FINDINGS: Mild cardiomegaly noted as well as pulmonary vascular congestion. Infiltrate or atelectasis is seen at the right lung base, and small right pleural effusion cannot be excluded. Left lung is clear. Multiple old right rib fracture deformities are noted, as well as right shoulder prosthesis. IMPRESSION: Right basilar atelectasis versus infiltrate, and possible small right pleural effusion. Mild cardiomegaly and pulmonary vascular congestion. Electronically Signed   By: Marlaine Hind M.D.   On: 10/12/2020 08:21   VAS [place] LOWER EXTREMITY VENOUS (DVT)  Result Date: 10/12/2020  Lower Venous DVT Study Indications: Atrial fibrillation.  Limitations: Poor ultrasound/tissue interface. Comparison Study: No prior studies. Performing Technologist: Darlin Coco RDMS,RVT  Examination Guidelines: Tavyn Kurka complete evaluation includes B-mode imaging, spectral Doppler, color Doppler, and power Doppler as needed of all accessible portions of each vessel. Bilateral testing is  considered an integral part of Marja Adderley complete examination. Limited examinations for reoccurring indications may be performed as noted. The reflux portion of the exam is performed with the patient in reverse Trendelenburg.  +---------+---------------+---------+-----------+----------+-------------------+ RIGHT    CompressibilityPhasicitySpontaneityPropertiesThrombus Aging      +---------+---------------+---------+-----------+----------+-------------------+ CFV      Full           Yes      Yes                                      +---------+---------------+---------+-----------+----------+-------------------+ SFJ      Full                                                             +---------+---------------+---------+-----------+----------+-------------------+ FV Prox  Full                                                             +---------+---------------+---------+-----------+----------+-------------------+ FV Mid   Full                                                             +---------+---------------+---------+-----------+----------+-------------------+ FV DistalFull                                                             +---------+---------------+---------+-----------+----------+-------------------+ PFV      Full                                                             +---------+---------------+---------+-----------+----------+-------------------+ POP      Full  Yes      Yes                                      +---------+---------------+---------+-----------+----------+-------------------+ PTV      Full                                                             +---------+---------------+---------+-----------+----------+-------------------+ PERO                                                  Not well visualized +---------+---------------+---------+-----------+----------+-------------------+    +---------+---------------+---------+-----------+----------+-------------------+ LEFT     CompressibilityPhasicitySpontaneityPropertiesThrombus Aging      +---------+---------------+---------+-----------+----------+-------------------+ CFV      Full           Yes      Yes                                      +---------+---------------+---------+-----------+----------+-------------------+ SFJ      Full                                                             +---------+---------------+---------+-----------+----------+-------------------+ FV Prox  Full                                                             +---------+---------------+---------+-----------+----------+-------------------+ FV Mid   Full                                                             +---------+---------------+---------+-----------+----------+-------------------+ FV DistalFull                                                             +---------+---------------+---------+-----------+----------+-------------------+ PFV      Full                                                             +---------+---------------+---------+-----------+----------+-------------------+ POP      Full           Yes      Yes                                      +---------+---------------+---------+-----------+----------+-------------------+  PTV      Full                                                             +---------+---------------+---------+-----------+----------+-------------------+ PERO                                                  Not well visualized +---------+---------------+---------+-----------+----------+-------------------+     Summary: RIGHT: - There is no evidence of deep vein thrombosis in the lower extremity. However, portions of this examination were limited- see technologist comments above.  - No cystic structure found in the popliteal fossa.  LEFT: - There is  no evidence of deep vein thrombosis in the lower extremity. However, portions of this examination were limited- see technologist comments above.  - No cystic structure found in the popliteal fossa.  *See table(s) above for measurements and observations. Electronically signed by Jamelle Haring on 10/12/2020 at 4:31:33 PM.    Final         Scheduled Meds: . cholecalciferol  1,000 Units Oral Daily  . citalopram  20 mg Oral Daily  . diclofenac Sodium  2 g Topical QID  . docusate sodium  100 mg Oral Daily  . feeding supplement  237 mL Oral BID BM  . hydrALAZINE  25 mg Oral TID  . levothyroxine  100 mcg Oral Q0600  . lisinopril  40 mg Oral Daily  . methylPREDNISolone (SOLU-MEDROL) injection  40 mg Intravenous Daily  . omega-3 acid ethyl esters  1 g Oral Daily  . pantoprazole  40 mg Oral QHS   Continuous Infusions: . diltiazem (CARDIZEM) infusion 5 mg/hr (10/13/20 1322)  . heparin 1,000 Units/hr (10/12/20 1914)     LOS: 5 days    Time spent: over 17 min    Fayrene Helper, MD [facility]   To contact the attending provider between 7A-7P or the covering provider during after hours 7P-7A, please log into the web site [email or web address] and access using universal [place] password for that web site. If you do not have the password, please call the hospital operator.  10/13/2020, 2:25 PM

## 2020-10-13 NOTE — Progress Notes (Signed)
Occupational Therapy Treatment Patient Details Name: [name] MRN: [id number] DOB: March 03, 1951 Today's Date: 10/13/2020    History of present illness Patient is a 70 y/o female who presents on 10/09/20 with low back/abdominal pain and new urinary/bowel incontinence. Found to have hyponatremia, hypokalemia and hypomagnesemia. CT abdomen/pelvis showed cirrhosis with portal HTN, right pleural effusion with interval development of T10, T8 T12 and L1 compression fractures. Plan for kyphoplasty when cardiac status improves.  PMH includes liver cirrhosis secondary to alcohol abuse, HTN.   OT comments  Pt. Was ed on back precautions for ADLs and with bed mobility. Pt. Was able to preform supine to sit using log roll with Min A. Pt. Was Min A with sit to stand from bed. Pt. Was Min guard assist with transfer to walker.  Pt. Was able to use cross leg technique for LE ADLs for back precautions and for energy conservation. Pt. Was cooperative with OT and acute OT to follow.   Follow Up Recommendations  Home health OT;Supervision - Intermittent    Equipment Recommendations  3 in 1 bedside commode;Other (comment)    Recommendations for Other Services      Precautions / Restrictions Precautions Precautions: Back;Fall;Other (comment) Precaution Booklet Issued: No Precaution Comments: reviewed back precautions, watch BP (been running high) Restrictions Weight Bearing Restrictions: No       Mobility Bed Mobility Overal bed mobility: Needs Assistance     Sidelying to sit: Min assist;HOB elevated            Transfers       Sit to Stand: Min assist Stand pivot transfers: Min guard            Balance     Sitting balance-Leahy Scale: Fair       Standing balance-Leahy Scale: Fair                             ADL either performed or assessed with clinical judgement   ADL Overall ADL's : Needs assistance/impaired     Grooming: Wash/dry hands;Wash/dry  face;Oral care;Standing           Upper Body Dressing : Supervision/safety;Set up;Sitting   Lower Body Dressing: Min guard;Sit to/from stand   Toilet Transfer: Minimal assistance;BSC;RW           Functional mobility during ADLs: Minimal assistance;Rolling walker General ADL Comments: Patient ed on back precautions using cross leg technique.     Vision   Vision Assessment?: No apparent visual deficits   Perception     Praxis      Cognition Arousal/Alertness: Awake/alert Behavior During Therapy: WFL for tasks assessed/performed Overall Cognitive Status: Impaired/Different from baseline Area of Impairment: Memory                     Memory: Decreased short-term memory         General Comments: Episodes of confusion noted.        Exercises     Shoulder Instructions       General Comments      Pertinent Vitals/ Pain       Pain Assessment: 0-10 Pain Score: 5  Pain Location: back Pain Descriptors / Indicators: Aching Pain Intervention(s): Premedicated before session;Monitored during session  Home Living  Prior Functioning/Environment              Frequency  Min 2X/week        Progress Toward Goals  OT Goals(current goals can now be found in the care plan section)  Progress towards OT goals: Progressing toward goals  Acute Rehab OT Goals Patient Stated Goal: get better OT Goal Formulation: With patient Time For Goal Achievement: 10/24/20 Potential to Achieve Goals: Good ADL Goals Pt Will Perform Grooming: with min guard assist;with supervision;with set-up;standing;with caregiver independent in assisting Pt Will Perform Upper Body Bathing: with supervision;with set-up;sitting;with caregiver independent in assisting Pt Will Perform Lower Body Bathing: with min assist;with adaptive equipment;with caregiver independent in assisting Pt Will Perform Upper Body Dressing: with  supervision;with set-up;with caregiver independent in assisting;sitting Pt Will Perform Lower Body Dressing: with min assist;with adaptive equipment;with caregiver independent in assisting Pt Will Transfer to Toilet: with min guard assist;ambulating Pt Will Perform Toileting - Clothing Manipulation and hygiene: with mod assist;with min assist;sit to/from stand;with caregiver independent in assisting Pt Will Perform Tub/Shower Transfer: with min guard assist;with supervision;ambulating;shower seat;3 in 1;grab bars;rolling walker  Plan Discharge plan remains appropriate    Co-evaluation                 AM-PAC OT "6 Clicks" Daily Activity     Outcome Measure   Help from another person eating meals?: None Help from another person taking care of personal grooming?: A Little Help from another person toileting, which includes using toliet, bedpan, or urinal?: A Little Help from another person bathing (including washing, rinsing, drying)?: A Lot Help from another person to put on and taking off regular upper body clothing?: A Little Help from another person to put on and taking off regular lower body clothing?: A Little 6 Click Score: 18    End of Session Equipment Utilized During Treatment: Rolling walker;Oxygen  OT Visit Diagnosis: Other abnormalities of gait and mobility (R26.89);Pain   Activity Tolerance Patient tolerated treatment well   Patient Left in chair;with call bell/phone within reach;with chair alarm set   Nurse Communication  (ok therapy.)        Time: 0915-1000 OT Time Calculation (min): 45 min  Charges: OT General Charges $OT Visit: 1 Visit OT Treatments $Self Care/Home Management : 23-37 mins $Therapeutic Activity: 8-22 mins  Reece Packer OT/L   Elnora Quizon 10/13/2020, 10:15 AM

## 2020-10-13 NOTE — Progress Notes (Signed)
   10/12/20 1411  Vitals  BP (!) 154/111  MAP (mmHg) 124  BP Location Left Arm  BP Method Automatic  Patient Position (if appropriate) Lying  Pulse Rate (!) 136  Pulse Rate Source Monitor  ECG Heart Rate (!) 136  Resp (!) 22  Level of Consciousness  Level of Consciousness Alert  MEWS COLOR  MEWS Score Color Red  Oxygen Therapy  SpO2 100 %  O2 Device Nasal Cannula  O2 Flow Rate (L/min) 2 L/min  MEWS Score  MEWS Temp 0  MEWS Systolic 0  MEWS Pulse 3  MEWS RR 1  MEWS LOC 0  MEWS Score 4   IV metoprolol given as ordered by MD.  Follow up V/S show initial slight improvement in pulse / B/P, but this was transient.  Will follow- up as appropriate as per MD.

## 2020-10-13 NOTE — Progress Notes (Addendum)
Progress Note  Patient Name: [name] Date of Encounter: 10/13/2020  [facility] Cardiologist: Elouise Munroe, MD   Subjective   Pt without complaints, her back feels better.  Her daughter is present asking about aneurysm found on CT.   Briefly discussed a fib and then she rec'd phone call. Waiting on call from Dr. Florene Glen  Inpatient Medications    Scheduled Meds: . cholecalciferol  1,000 Units Oral Daily  . citalopram  20 mg Oral Daily  . diclofenac Sodium  2 g Topical QID  . docusate sodium  100 mg Oral Daily  . feeding supplement  237 mL Oral BID BM  . hydrALAZINE  25 mg Oral TID  . levothyroxine  100 mcg Oral Q0600  . lisinopril  40 mg Oral Daily  . methylPREDNISolone (SOLU-MEDROL) injection  40 mg Intravenous Daily  . omega-3 acid ethyl esters  1 g Oral Daily  . pantoprazole  40 mg Oral QHS   Continuous Infusions: . diltiazem (CARDIZEM) infusion 5 mg/hr (10/13/20 1322)  . heparin 1,000 Units/hr (10/12/20 1914)   PRN Meds: acetaminophen, dextromethorphan-guaiFENesin, iohexol, ipratropium-albuterol, labetalol, metoprolol tartrate, morphine injection, oxyCODONE, polyethylene glycol, senna-docusate   Vital Signs    Vitals:   10/13/20 0418 10/13/20 0834 10/13/20 1040 10/13/20 1119  BP: (!) 155/96 129/82 (!) 157/96 126/77  Pulse: 90 80 88 94  Resp: 20   20  Temp: 97.7 F (36.5 C) 98 F (36.7 C)  97.9 F (36.6 C)  TempSrc: Oral Oral  Oral  SpO2: 98% 100%  100%  Weight:      Height:        Intake/Output Summary (Last 24 hours) at 10/13/2020 1408 Last data filed at 10/13/2020 1058 Gross per 24 hour  Intake 632.09 ml  Output --  Net 632.09 ml   Last 3 Weights 10/13/2020 10/12/2020 10/11/2020  Weight (lbs) 179 lb 7.3 oz 174 lb 2.6 oz 185 lb 10 oz  Weight (kg) 81.4 kg 79 kg 84.2 kg      Telemetry    In and out of atrial fib but improved rate control - Personally Reviewed  ECG    No new - Personally Reviewed  Physical Exam   GEN: No acute  distress.   Neck: No JVD Cardiac: irreg irreg, no murmurs, rubs, or gallops.  Respiratory: Clear to auscultation bilaterally. GI: Soft, nontender, non-distended  MS: No edema; No deformity. Neuro:  Nonfocal  Psych: Normal affect   Labs    High Sensitivity Troponin:  No results for input(s): TROPONINIHS in the last 720 hours.    Chemistry Recent Labs  Lab 10/11/20 2302 10/12/20 0802 10/13/20 0153  NA 129* 131* 132*  K 4.0 4.0 4.4  CL 86* 88* 88*  CO2 34* 35* 31  GLUCOSE 135* 140* 105*  BUN 11 11 16   CREATININE 0.49 0.52 0.62  CALCIUM 9.1 9.4 9.5  PROT 6.0* 6.1* 5.7*  ALBUMIN 3.3* 3.4* 3.2*  AST 21 22 24   ALT 14 14 14   ALKPHOS 97 98 77  BILITOT 0.6 1.0 0.9  GFRNONAA >60 >60 >60  ANIONGAP 9 8 13      Hematology Recent Labs  Lab 10/11/20 2302 10/12/20 0802 10/13/20 0153  WBC 7.9 8.8 8.9  RBC 3.78* 3.73* 3.55*  HGB 12.6 12.3 11.9*  HCT 33.8* 33.4* 32.2*  MCV 89.4 89.5 90.7  MCH 33.3 33.0 33.5  MCHC 37.3* 36.8* 37.0*  RDW 13.1 12.9 13.1  PLT 213 234 192    BNP Recent  Labs  Lab 10/11/20 2305 10/12/20 0802  BNP 949.4* 902.7*     DDimer No results for input(s): DDIMER in the last 168 hours.   Radiology    CT ANGIO HEAD W OR WO CONTRAST  Result Date: 10/12/2020 CLINICAL DATA:  Delirium.  Cerebral aneurysm. EXAM: CT ANGIOGRAPHY HEAD AND NECK TECHNIQUE: Multidetector CT imaging of the head and neck was performed using the standard protocol during bolus administration of intravenous contrast. Multiplanar CT image reconstructions and MIPs were obtained to evaluate the vascular anatomy. Carotid stenosis measurements (when applicable) are obtained utilizing NASCET criteria, using the distal internal carotid diameter as the denominator. CONTRAST:  79mL OMNIPAQUE IOHEXOL 350 MG/ML SOLN COMPARISON:  CT head 10/11/2020 FINDINGS: CTA NECK FINDINGS Aortic arch: Standard branching. Imaged portion shows no evidence of aneurysm or dissection. No significant stenosis of the  major arch vessel origins. Mild atherosclerotic disease aortic arch and proximal great vessels. Right carotid system: Mild atherosclerotic disease right carotid bifurcation. Negative for stenosis. Left carotid system: Mild atherosclerotic disease left carotid bifurcation. Negative for stenosis. Vertebral arteries: Both vertebral arteries patent to the basilar. Mild calcific stenosis distal left vertebral artery at the skull base. Skeleton: Mild degenerative change cervical spine. No acute skeletal abnormality. Other neck: Negative Upper chest: Lung apices clear bilaterally. Review of the MIP images confirms the above findings CTA HEAD FINDINGS Anterior circulation: Cavernous carotid widely patent bilaterally. Anterior middle cerebral arteries normal bilaterally. No stenosis or aneurysm in the anterior circulation. Posterior circulation: Both vertebral arteries patent to the basilar. Mild stenosis distal left vertebral artery at the skull base. Right PICA patent. Left PICA not visualized. Basilar widely patent. AICA, superior cerebellar, and posterior cerebral arteries widely patent without stenosis. 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 Mildly lobulated or margins. Aneurysm measures approximately 8 x 9 mm in diameter. No evidence of rupture or thrombus. Venous sinuses: Normal venous enhancement Anatomic variants: None Review of the MIP images confirms the above findings IMPRESSION: 1. 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 No other aneurysm 2. No intracranial stenosis or large vessel occlusion 3. No significant carotid or vertebral artery stenosis. Electronically Signed   By: Franchot Gallo M.D.   On: 10/12/2020 11:38   CT HEAD WO CONTRAST  Result Date: 10/12/2020 CLINICAL DATA:  Delirium EXAM: CT HEAD WITHOUT CONTRAST TECHNIQUE: Contiguous axial images were obtained from the base of the skull through the vertex without intravenous contrast. COMPARISON:  None.  FINDINGS: Brain: Cerebral ventricle sizes are concordant with the degree of cerebral volume loss. Patchy and confluent areas of decreased attenuation are noted throughout the deep and periventricular white matter of the cerebral hemispheres bilaterally, compatible with chronic microvascular ischemic disease. No evidence of large-territorial acute infarction. No parenchymal hemorrhage. No mass lesion. No extra-axial collection. No mass effect or midline shift. No hydrocephalus. Basilar cisterns are patent. Vascular: Aneurysmal dilatation of the basilar artery (6:26, 3:11). Skull: Heterogeneous appearance of the osseous structures. No acute fracture or focal lesion. Sinuses/Orbits: Paranasal sinuses and mastoid air cells are clear. The orbits are unremarkable. Other: None. IMPRESSION: 1. Aneurysmal dilatation of the basilar artery. Recommend CT angioagraphy for further evaluation. 2. Nonspecific heterogeneous appearance of the osseous structures. 3. No acute intracranial abnormality. These results will be called to the ordering clinician or representative by the Radiologist Assistant, and communication documented in the PACS or [employer]. Electronically Signed   By: Iven Finn M.D.   On: 10/12/2020 00:00   CT ANGIO  NECK W OR WO CONTRAST  Result Date: 10/12/2020 CLINICAL DATA:  Delirium.  Cerebral aneurysm. EXAM: CT ANGIOGRAPHY HEAD AND NECK TECHNIQUE: Multidetector CT imaging of the head and neck was performed using the standard protocol during bolus administration of intravenous contrast. Multiplanar CT image reconstructions and MIPs were obtained to evaluate the vascular anatomy. Carotid stenosis measurements (when applicable) are obtained utilizing NASCET criteria, using the distal internal carotid diameter as the denominator. CONTRAST:  60mL OMNIPAQUE IOHEXOL 350 MG/ML SOLN COMPARISON:  CT head 10/11/2020 FINDINGS: CTA NECK FINDINGS Aortic arch: Standard branching. Imaged portion shows no evidence  of aneurysm or dissection. No significant stenosis of the major arch vessel origins. Mild atherosclerotic disease aortic arch and proximal great vessels. Right carotid system: Mild atherosclerotic disease right carotid bifurcation. Negative for stenosis. Left carotid system: Mild atherosclerotic disease left carotid bifurcation. Negative for stenosis. Vertebral arteries: Both vertebral arteries patent to the basilar. Mild calcific stenosis distal left vertebral artery at the skull base. Skeleton: Mild degenerative change cervical spine. No acute skeletal abnormality. Other neck: Negative Upper chest: Lung apices clear bilaterally. Review of the MIP images confirms the above findings CTA HEAD FINDINGS Anterior circulation: Cavernous carotid widely patent bilaterally. Anterior middle cerebral arteries normal bilaterally. No stenosis or aneurysm in the anterior circulation. Posterior circulation: Both vertebral arteries patent to the basilar. Mild stenosis distal left vertebral artery at the skull base. Right PICA patent. Left PICA not visualized. Basilar widely patent. AICA, superior cerebellar, and posterior cerebral arteries widely patent without stenosis. 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 Mildly lobulated or margins. Aneurysm measures approximately 8 x 9 mm in diameter. No evidence of rupture or thrombus. Venous sinuses: Normal venous enhancement Anatomic variants: None Review of the MIP images confirms the above findings IMPRESSION: 1. 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 No other aneurysm 2. No intracranial stenosis or large vessel occlusion 3. No significant carotid or vertebral artery stenosis. Electronically Signed   By: Franchot Gallo M.D.   On: 10/12/2020 11:38   MR CERVICAL SPINE WO CONTRAST  Result Date: 10/13/2020 CLINICAL DATA:  Neck and back pain after fall.  [place]. EXAM: MRI CERVICAL SPINE WITHOUT CONTRAST TECHNIQUE: Multiplanar,  multisequence MR imaging of the cervical spine was performed. No intravenous contrast was administered. COMPARISON:  Thoracic MRI 10/09/2020 FINDINGS: Alignment: Slight C6-7 anterolisthesis. Vertebrae: No fracture, evidence of discitis, or bone lesion. Cord: Hydromyelia seen from C4-5 into the thoracic spine, measuring up to 2-3 mm in the thoracic spine. No underlying cord swelling, mass, or impingement. Posterior Fossa, vertebral arteries, paraspinal tissues: Mild T2 hyperintensity in the pons attributed to chronic small vessel ischemia. Disc levels: Disc narrowing and bulging especially at C3-4 to C5-6. Mild facet spurring and ligamentum flavum buckling at the same levels. Diffusely patent spinal canal. 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 No underlying cord lesion, compression, or edema. Ordinary cervical spine degeneration. Electronically Signed   By: Monte Fantasia M.D.   On: 10/13/2020 09:19   DG CHEST PORT 1 VIEW  Result Date: 10/12/2020 CLINICAL DATA:  Shortness of breath. EXAM: PORTABLE CHEST 1 VIEW COMPARISON:  07/18/2013 FINDINGS: Mild cardiomegaly noted as well as pulmonary vascular congestion. Infiltrate or atelectasis is seen at the right lung base, and small right pleural effusion cannot be excluded. Left lung is clear. Multiple old right rib fracture deformities are noted, as well as right shoulder prosthesis. IMPRESSION: Right basilar atelectasis versus infiltrate, and possible small right  pleural effusion. Mild cardiomegaly and pulmonary vascular congestion. Electronically Signed   By: Marlaine Hind M.D.   On: 10/12/2020 08:21   VAS [place] LOWER EXTREMITY VENOUS (DVT)  Result Date: 10/12/2020  Lower Venous DVT Study Indications: Atrial fibrillation.  Limitations: Poor ultrasound/tissue interface. Comparison Study: No prior studies. Performing Technologist: Darlin Coco RDMS,RVT  Examination Guidelines: A complete evaluation includes B-mode imaging,  spectral Doppler, color Doppler, and power Doppler as needed of all accessible portions of each vessel. Bilateral testing is considered an integral part of a complete examination. Limited examinations for reoccurring indications may be performed as noted. The reflux portion of the exam is performed with the patient in reverse Trendelenburg.  +---------+---------------+---------+-----------+----------+-------------------+ RIGHT    CompressibilityPhasicitySpontaneityPropertiesThrombus Aging      +---------+---------------+---------+-----------+----------+-------------------+ CFV      Full           Yes      Yes                                      +---------+---------------+---------+-----------+----------+-------------------+ SFJ      Full                                                             +---------+---------------+---------+-----------+----------+-------------------+ FV Prox  Full                                                             +---------+---------------+---------+-----------+----------+-------------------+ FV Mid   Full                                                             +---------+---------------+---------+-----------+----------+-------------------+ FV DistalFull                                                             +---------+---------------+---------+-----------+----------+-------------------+ PFV      Full                                                             +---------+---------------+---------+-----------+----------+-------------------+ POP      Full           Yes      Yes                                      +---------+---------------+---------+-----------+----------+-------------------+ PTV      Full                                                             +---------+---------------+---------+-----------+----------+-------------------+  PERO                                                  Not  well visualized +---------+---------------+---------+-----------+----------+-------------------+   +---------+---------------+---------+-----------+----------+-------------------+ LEFT     CompressibilityPhasicitySpontaneityPropertiesThrombus Aging      +---------+---------------+---------+-----------+----------+-------------------+ CFV      Full           Yes      Yes                                      +---------+---------------+---------+-----------+----------+-------------------+ SFJ      Full                                                             +---------+---------------+---------+-----------+----------+-------------------+ FV Prox  Full                                                             +---------+---------------+---------+-----------+----------+-------------------+ FV Mid   Full                                                             +---------+---------------+---------+-----------+----------+-------------------+ FV DistalFull                                                             +---------+---------------+---------+-----------+----------+-------------------+ PFV      Full                                                             +---------+---------------+---------+-----------+----------+-------------------+ POP      Full           Yes      Yes                                      +---------+---------------+---------+-----------+----------+-------------------+ PTV      Full                                                             +---------+---------------+---------+-----------+----------+-------------------+ PERO  Not well visualized +---------+---------------+---------+-----------+----------+-------------------+     Summary: RIGHT: - There is no evidence of deep vein thrombosis in the lower extremity. However, portions of this examination were limited- see  technologist comments above.  - No cystic structure found in the popliteal fossa.  LEFT: - There is no evidence of deep vein thrombosis in the lower extremity. However, portions of this examination were limited- see technologist comments above.  - No cystic structure found in the popliteal fossa.  *See table(s) above for measurements and observations. Electronically signed by Jamelle Haring on 10/12/2020 at 4:31:33 PM.    Final     Cardiac Studies   Echo 10/10/20  IMPRESSIONS    1. Left ventricular ejection fraction, by estimation, is 55 to 60%. The  left ventricle has normal function. The left ventricle has no regional  wall motion abnormalities. There is moderate asymmetric hypertrophy of the  basal septal segment. The rest of  the LV segments demonstrate mild left ventricular hypertrophy. Left  ventricular diastolic parameters are consistent with Grade I diastolic  dysfunction (impaired relaxation). Elevated left atrial pressure.  2. Right ventricular systolic function is normal. The right ventricular  size is mildly enlarged. There is severely elevated pulmonary artery  systolic pressure. The estimated right ventricular systolic pressure is  62.3 mmHg.  3. Left atrial size was severely dilated.  4. Right atrial size was mildly dilated.  5. The mitral valve is abnormal. There are mildly elevated gradients with  flow acceleration noted across the mitral valve with mean gradient 58mmHg  at HR 77bpm. MVA by continuity 2.7cm2 with no significant mitral stenosis.  Mild mitral valve  regurgitation.  6. The aortic valve is tricuspid. There is mild calcification of the  aortic valve. There is moderate thickening of the aortic valve. The [place]  appears fixed with trivial AR. Mild to moderate aortic valve  sclerosis/calcification is present, without any  evidence of aortic stenosis.  7. The inferior vena cava is normal in size with greater than 50%  respiratory variability, suggesting  right atrial pressure of 3 mmHg.   Comparison(s): Compared to prior TTE in 2016, there is now severe  pulmonary HTN present with PASP 40mmHg (previously 67mmHg).   FINDINGS  Left Ventricle: Left ventricular ejection fraction, by estimation, is 55  to 60%. The left ventricle has normal function. The left ventricle has no  regional wall motion abnormalities. The left ventricular internal cavity  size was normal in size. There is  moderate asymmetric hypertrophy of the basal septal segment. The rest of  the LV segments demonstrate mild left ventricular hypertrophy. Left  ventricular diastolic parameters are consistent with Grade I diastolic  dysfunction (impaired relaxation).  Elevated left atrial pressure.   Right Ventricle: The right ventricular size is mildly enlarged. No  increase in right ventricular wall thickness. Right ventricular systolic  function is normal. There is severely elevated pulmonary artery systolic  pressure. The tricuspid regurgitant  velocity is 3.80 m/s, and with an assumed right atrial pressure of 3 mmHg,  the estimated right ventricular systolic pressure is 76.2 mmHg.   Left Atrium: Left atrial size was severely dilated.   Right Atrium: Right atrial size was mildly dilated.   Pericardium: There is no evidence of pericardial effusion.   Mitral Valve: There are mildly elevated gradients with flow acceleration  noted across the mitral valve with mean gradient 63mmHg at HR 77bpm. MVA by  continuity 2.7cm2 with no significant stenosis. The mitral valve is  abnormal. There is  moderate thickening  of the mitral valve leaflet(s). There is moderate calcification of the  mitral valve leaflet(s). Moderate mitral annular calcification. Mild  mitral valve regurgitation. No evidence of mitral valve stenosis.   Tricuspid Valve: The tricuspid valve is normal in structure. Tricuspid  valve regurgitation is mild.   Aortic Valve: The aortic valve is tricuspid. There is  mild calcification  of the aortic valve. There is moderate thickening of the aortic valve.  Aortic valve regurgitation LCC is fixed with trivial AR. Mild to moderate  aortic valve sclerosis/calcification  is present, without any evidence of aortic stenosis.   Pulmonic Valve: The pulmonic valve was normal in structure. Pulmonic valve  regurgitation is trivial.   Aorta: The aortic root and ascending aorta are structurally normal, with  no evidence of dilitation.   Venous: The inferior vena cava is normal in size with greater than 50%  respiratory variability, suggesting right atrial pressure of 3 mmHg.   IAS/Shunts: No atrial level shunt detected by color flow Doppler.    Patient Profile     70 y.o. female with a hx of HTN, GERD, hypothyroid, liver cirrhosis, + tobacco use, COPD, admitted 10/08/20 with back and abd pain along with hypokalemia,(K+ 2.5, Mg+ 1.1) hypomg+ hyponatremia and new diagnosis of atrial fib RVR.   Assessment & Plan    1. Atrial fib with RVR, at times up to 143 has been in and out of afib to SR.  But mostly a fib.  Has not rec'd anticoagulation with need for Kyphoplasty add heparin for now.  IV dilt with normal EF. No hx of CAD  Vs add BB though BB has been added.   but with hx of COPD and hypoxia on arrival would be hesitant. With dilt drip HR improved and pt in SR at times now a fib. Would leave on dilt drip until post kyphoplasty and then po 2. Elevated PA systolic pressure and RV systolic pressure in 94.4 mmHg   3. HTN poorly controlled IV dilt will help and stop amlodipine. At home she was on lisinopril as well. This is just being added back.  BP improved today 4. Hypoxia planning for CT PE protocol her venous doppler neg for DVT. She is on steroids  5. Back pain due to thoracic compression fracture plan for kyphoplasty once HR and BP controlled 6. 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 Florene Glen and neurosurgery 7. abd pain/liver cirrhosis/ETOG abuse. Per  IM  8. + tobacco- 1ppd wants to stop but has been unable.  use wit hx of COPD on steroids, + ETOH 2-3 glasses of wine per day.   9. Hypothyroidism wit TSH of 0.241  And synthroid just reduced today to 100 mcg daily.  10. HLD on lovaza continue      For questions or updates, please contact [place] Please consult [email or web address] for contact info under        Signed, Cecilie Kicks, NP  10/13/2020, 2:08 PM    Patient seen and examined with Cecilie Kicks, NP.  Agree as above, with the following exceptions and changes as noted below.  Patient feels better today and heart rates have improved on IV diltiazem. Gen: NAD, CV: iRRR, no murmurs, Lungs: clear, Abd: soft, Extrem: Warm, well perfused, no edema, Neuro/Psych: alert and oriented x 3, normal mood and affect. All available labs, radiology testing, previous records reviewed.  Patient is on IV diltiazem for rate control and IV heparin for anticoagulation in the perioperative period.  Would recommend continuing IV diltiazem and this timeframe for best rate control given variable rates at times.  This will also help with blood pressure control.  Continue IV heparin at this time, if no concerns for bleeding.  Cardiology will follow along.  Elouise Munroe, MD 10/13/20 6:27 PM

## 2020-10-13 NOTE — Progress Notes (Signed)
It was reported to this nurse that patient had been increasingly confused / agitated throughout the night.  Patient presents as in no acute physical distress, but is confused and disoriented;  Demanding cigarettes and not comprehending that she is in the hospital.  MD aware of new onset delirium and onset of increased HR, which has also progressed over the past 24 hours.  Will be seen by cardiology today and further evaluation to be forthcoming.

## 2020-10-13 NOTE — Progress Notes (Signed)
   10/12/20 1900  Vitals  BP (!) 160/115  BP Location Left Arm  BP Method Automatic  Patient Position (if appropriate) Lying  Pulse Rate (!) 120  Pulse Rate Source Monitor  ECG Heart Rate (!) 110  Resp (!) 24  Level of Consciousness  Level of Consciousness New agitation confusion  MEWS COLOR  MEWS Score Color Yellow  MEWS Score  MEWS Temp 0  MEWS Systolic 0  MEWS Pulse 1  MEWS RR 1  MEWS LOC 1  MEWS Score 3  Cardizem and heparin gtts initiated via RUA.  Patient confused.  Area wrapped with [place] to protect integrity of site.

## 2020-10-13 NOTE — Progress Notes (Signed)
Physical Therapy Treatment Patient Details Name: [name] MRN: [id number] DOB: Apr 23, 1951 Today's Date: 10/13/2020    History of Present Illness Patient is a 70 y/o female who presents on 10/09/20 with low back/abdominal pain and new urinary/bowel incontinence. Found to have hyponatremia, hypokalemia and hypomagnesemia. CT abdomen/pelvis showed cirrhosis with portal HTN, right pleural effusion with interval development of T10, T8 T12 and L1 compression fractures. Plan for kyphoplasty when cardiac status improves.  PMH includes liver cirrhosis secondary to alcohol abuse, HTN.    PT Comments    Pt was seen for mobility with RW initially used for standing practice with struggle to stay upright.  Pt is able to control balance with min assist, and required min assist to side step to the bed.  Repositioning on the bed with mod max assist, and used bed features to do a dependent scoot up in the bed.  Follow for goals of acute PT.  Pt may be having a procedure for spinal fracture repair tomorrow if BP is controlled.   Follow Up Recommendations  Home health PT;Supervision for mobility/OOB     Equipment Recommendations  None recommended by PT    Recommendations for Other Services       Precautions / Restrictions Precautions Precautions: Back;Fall;Other (comment) Precaution Booklet Issued: No Precaution Comments: reviewed back precautions, watch BP (been running high) Restrictions Weight Bearing Restrictions: No    Mobility  Bed Mobility               General bed mobility comments: up in chair    Transfers Overall transfer level: Needs assistance Equipment used: Rolling walker (2 wheeled)   Sit to Stand: Min assist Stand pivot transfers: Min assist       General transfer comment: transfer from chair to bed with min assist  Ambulation/Gait             General Gait Details: steps to transfer to chair   Stairs             Wheelchair Mobility     Modified Rankin (Stroke Patients Only)       Balance Overall balance assessment: History of Falls;Needs assistance Sitting-balance support: Feet supported;No upper extremity supported Sitting balance-Leahy Scale: Fair       Standing balance-Leahy Scale: Poor                              Cognition Arousal/Alertness: Awake/alert Behavior During Therapy: WFL for tasks assessed/performed Overall Cognitive Status: Impaired/Different from baseline Area of Impairment: Awareness;Problem solving                     Memory: Decreased short-term memory;Decreased recall of precautions     Awareness: Intellectual Problem Solving: Slow processing;Requires verbal cues General Comments: alternating clear answers and some confusion      Exercises      General Comments General comments (skin integrity, edema, etc.): pt was up in chair when PT arrived and was a bit unsteady on her feet with pain to stand and move      Pertinent Vitals/Pain Pain Assessment: 0-10 Pain Score: 6  Pain Location: back Pain Descriptors / Indicators: Guarding Pain Intervention(s): Limited activity within patient's tolerance;Premedicated before session;Repositioned    Home Living                      Prior Function  PT Goals (current goals can now be found in the care plan section) Acute Rehab PT Goals Patient Stated Goal: reduce back pain Progress towards PT goals: Progressing toward goals    Frequency    Min 3X/week      PT Plan Current plan remains appropriate    Co-evaluation              AM-PAC PT "6 Clicks" Mobility   Outcome Measure  Help needed turning from your back to your side while in a flat bed without using bedrails?: A Little Help needed moving from lying on your back to sitting on the side of a flat bed without using bedrails?: A Little Help needed moving to and from a bed to a chair (including a wheelchair)?: A Little Help  needed standing up from a chair using your arms (e.g., wheelchair or bedside chair)?: A Little Help needed to walk in hospital room?: A Little Help needed climbing 3-5 steps with a railing? : A Lot 6 Click Score: 17    End of Session Equipment Utilized During Treatment: Oxygen;Gait belt Activity Tolerance: Patient limited by fatigue;Patient limited by pain Patient left: in bed;with call bell/phone within reach;with family/visitor present;with nursing/sitter in room Nurse Communication: Mobility status;Other (comment) PT Visit Diagnosis: Pain;Difficulty in walking, not elsewhere classified (R26.2);Muscle weakness (generalized) (M62.81) Pain - part of body:  (back)     Time: 9563-8756 PT Time Calculation (min) (ACUTE ONLY): 24 min  Charges:  $Therapeutic Activity: 23-37 mins                 Ramond Dial 10/13/2020, 9:05 PM  Mee Hives, PT MS Acute Rehab Dept. Number: [place] [facility] [place]

## 2020-10-13 NOTE — Care Management Important Message (Signed)
Important Message  Patient Details  Name: [name] MRN: [id number] Date of Birth: [birth year]   Medicare Important Message Given:  Yes     Shelda Altes 10/13/2020, 11:06 AM

## 2020-10-14 LAB — HEPARIN LEVEL (UNFRACTIONATED)
Heparin Unfractionated: 0.25 IU/mL — ABNORMAL LOW (ref 0.30–0.70)
Heparin Unfractionated: 0.58 IU/mL (ref 0.30–0.70)

## 2020-10-14 LAB — COMPREHENSIVE METABOLIC PANEL
ALT: 17 U/L (ref 0–44)
AST: 22 U/L (ref 15–41)
Albumin: 3.1 g/dL — ABNORMAL LOW (ref 3.5–5.0)
Alkaline Phosphatase: 75 U/L (ref 38–126)
Anion gap: 6 (ref 5–15)
BUN: 20 mg/dL (ref 8–23)
CO2: 37 mmol/L — ABNORMAL HIGH (ref 22–32)
Calcium: 9.1 mg/dL (ref 8.9–10.3)
Chloride: 87 mmol/L — ABNORMAL LOW (ref 98–111)
Creatinine, Ser: 0.63 mg/dL (ref 0.44–1.00)
GFR, Estimated: >60 mL/min (ref >60)
Glucose, Bld: 99 mg/dL (ref 70–99)
Potassium: 4.1 mmol/L (ref 3.5–5.1)
Sodium: 130 mmol/L — ABNORMAL LOW (ref 135–145)
Total Bilirubin: 1 mg/dL (ref 0.3–1.2)
Total Protein: 5.5 g/dL — ABNORMAL LOW (ref 6.5–8.1)

## 2020-10-14 LAB — PHOSPHORUS: Phosphorus: 3.3 mg/dL (ref 2.5–4.6)

## 2020-10-14 LAB — CBC WITH DIFFERENTIAL/PLATELET
Abs Immature Granulocytes: 0.02 10*3/uL (ref 0.00–0.07)
Basophils Absolute: 0 10*3/uL (ref 0.0–0.1)
Basophils Relative: 0 %
Eosinophils Absolute: 0 10*3/uL (ref 0.0–0.5)
Eosinophils Relative: 0 %
HCT: 31.5 % — ABNORMAL LOW (ref 36.0–46.0)
Hemoglobin: 11.6 g/dL — ABNORMAL LOW (ref 12.0–15.0)
Immature Granulocytes: 0 %
Lymphocytes Relative: 8 %
Lymphs Abs: 0.5 10*3/uL — ABNORMAL LOW (ref 0.7–4.0)
MCH: 33.1 pg (ref 26.0–34.0)
MCHC: 36.8 g/dL — ABNORMAL HIGH (ref 30.0–36.0)
MCV: 90 fL (ref 80.0–100.0)
Monocytes Absolute: 0.6 10*3/uL (ref 0.1–1.0)
Monocytes Relative: 8 %
Neutro Abs: 6 10*3/uL (ref 1.7–7.7)
Neutrophils Relative %: 84 %
Platelets: 203 10*3/uL (ref 150–400)
RBC: 3.5 MIL/uL — ABNORMAL LOW (ref 3.87–5.11)
RDW: 13.1 % (ref 11.5–15.5)
WBC: 7.2 10*3/uL (ref 4.0–10.5)
nRBC: 0 % (ref 0.0–0.2)

## 2020-10-14 LAB — MAGNESIUM: Magnesium: 1.9 mg/dL (ref 1.7–2.4)

## 2020-10-14 LAB — GLUCOSE, CAPILLARY
Glucose-Capillary: 105 mg/dL — ABNORMAL HIGH (ref 70–99)
Glucose-Capillary: 123 mg/dL — ABNORMAL HIGH (ref 70–99)

## 2020-10-14 MED ORDER — HYDRALAZINE HCL 50 MG PO TABS
50.0000 mg | ORAL_TABLET | Freq: Three times a day (TID) | ORAL | Status: DC
  Administered 2020-10-14 – 2020-10-18 (×12): 50 mg via ORAL
  Filled 2020-10-14 (×12): qty 1

## 2020-10-14 MED ORDER — CEFAZOLIN SODIUM-DEXTROSE 2-4 GM/100ML-% IV SOLN
2.0000 g | INTRAVENOUS | Status: AC
  Administered 2020-10-17: 2 g via INTRAVENOUS

## 2020-10-14 MED ORDER — DILTIAZEM HCL-DEXTROSE 125-5 MG/125ML-% IV SOLN (PREMIX)
5.0000 mg/h | INTRAVENOUS | Status: DC
  Administered 2020-10-14: 5 mg/h via INTRAVENOUS
  Filled 2020-10-14 (×4): qty 125

## 2020-10-14 MED ORDER — DILTIAZEM HCL 30 MG PO TABS
30.0000 mg | ORAL_TABLET | Freq: Four times a day (QID) | ORAL | Status: DC
  Administered 2020-10-14 – 2020-10-17 (×12): 30 mg via ORAL
  Filled 2020-10-14 (×12): qty 1

## 2020-10-14 NOTE — Progress Notes (Addendum)
PROGRESS NOTE    [name]  ZOX:[id number] DOB: 1950/08/04 DOA: 10/08/2020 PCP: [name], MD   Chief Complaint  Patient presents with  . Back Pain  . Abdominal Pain   Brief Narrative:  70 year old with history of HTN, hypothyroidism, GERD, liver cirrhosis presents with abdominal and low back pain.  Upon admission patient had hyponatremia, hypokalemia, hypomagnesemia CT abdomen pelvis showed cirrhosis with portal hypertension, right pleural effusion with interval development of T10, T8 T12 and L1 compression fracture.  MRI confirmed these findings.  IR consulted who recommended kyphoplasty. Plan pending improvement in afib with RVR.   Assessment & Plan:   Principal Problem:   Back pain Active Problems:   HYPERTENSION, BENIGN ESSENTIAL   Hyponatremia   Hypothyroidism   Hypokalemia due to excessive gastrointestinal loss of potassium   Abdominal pain   Hypomagnesemia   Compression fracture of body of thoracic vertebra (HCC)   History of cirrhosis of liver  Atrial Fibrillation with RVR No prior hx, appears new onset Follow TSH (on low side, synthroid reduced), echo - will follow LE [place] (negative), CT PE protocol negative for PE, small right pleural effusion with right lower lobe atelectasis/infiltrate Has elevated BNP as well chadvasc at least 3 dilt gtt, start oral dilt Will ask cardiology to see as well - appreciate assistance - PO and dilt gtt, heparin gtt  Acute Metabolic Encephalopathy Suspect this is 2/2 hospital delirium in setting of opiate use Blood gas with normal pCO2 TSH low, synthroid reduced.  Follow b12 (wnl), folate (wnl), ammonia (wnl).  Delirium precautions Improved today, though per discussion with daughter, this continues to wax/wane  Back pain due Thoracic compression fracture -Significant amount of pain.  CT abdomen pelvis shows compression fracture T10, T12 and L1 -Pain control-oxycodone IR 10 mg and IV morphine as needed.  Bowel  regimen. -IR consulted, MRI performed -> acute compression fx involving the T10 vertebral body with up to 60% height loss and 4 mm bony retropulsion, no significant spinal stenosis, acute compression fx involving T9 vertebral body with up to 25% height loss without significant bony retropulsion, late to subacute to chronic severe compression fractures involving the T12 and L1 vertebral bodies with 80-90% height loss and up to 4 mm bony retropulsion, no significant spinal stenosis.  Small syrinx measuring up to 3 mm involving the majority of the thoracic spinal cord, terminating at T8-9.   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 Kathyrn Sheriff  Abdominal pain, improved Liver cirrhosis with history of alcohol use History of cholecystectomy -Continue PPI.  Pain control -LFTs relatively normal, lipase normal -Right upper quadrant ultrasound-showed cirrhotic pathology -Acute hepatitis-negative -Echo- EF 55 to 60%, grade 1 DD, severely elevated pulmonary artery pressure - Outpatient GI follow-up, notified about her GI to arrange for follow-up -Endoscopy 06/2018- showed gastritis and multiple duodenal ulcer secondary to NSAID  Vitamin D deficiency - Supplement started  Daily tobacco use with history of COPD, mild exacerbation - steroids, bronchodilators, I-S, flutter valve -- discussed smoking cessation and discussed possibilities of meds for assistance with  cessation, she declined  Hyponatremia -Secondary  to cirrhosis.  Initially due to intravascular volume depletion required fluids with due to signs of volume overload and has been stopped for now  Hypokalemia, resolved - replace and follow as needed  Hypomagnesemia - replace and follow as needed  Hypothyroidism -Low TSH elevated T4.  Reduced to Synthroid 100 mcg daily  Essential hypertension: UnControlled -Continue lisinopril 40 mg daily.  Increase hydralazine 25 mg 3 times daily.  Add Norvasc 5 mg daily  Hyperlipidemia -Continue Lovaza  Hyperglycemia Follow a1c 4.7  DVT prophylaxis: SCD Code Status: full  Family Communication: none at bedside - daughter over phone 4/22 Disposition:   Status is: Inpatient  Remains inpatient appropriate because:Inpatient level of care appropriate due to severity of illness   Dispo: The patient is from: Home              Anticipated d/c is to: Home              Patient currently is not medically stable to d/c.   Difficult to place patient No       Consultants:   IR  cardiology  Procedures none  Antimicrobials: Anti-infectives (From admission, onward)   Start     Dose/Rate Route Frequency Ordered Stop   10/17/20 0800  ceFAZolin (ANCEF) IVPB 2g/100 mL premix        2 g 200 mL/hr over 30 Minutes Intravenous To Radiology 10/14/20 1335 10/18/20 0800   10/11/20 0600  ceFAZolin (ANCEF) IVPB 2g/100 mL premix        2 g 200 mL/hr over 30 Minutes Intravenous To Radiology 10/10/20 1448 10/12/20 0600      Subjective: Continued back pain   Objective: Vitals:   10/13/20 2258 10/14/20 0439 10/14/20 0727 10/14/20 1324  BP: (!) 151/81 (!) 143/82 (!) 150/97 117/81  Pulse: 86 84 93 60  Resp: 18 18 17 18   Temp: 98.4 F (36.9 C) 97.9 F (36.6 C) 98.1 F (36.7 C) 98.6 F (37 C)  TempSrc: Oral Oral Oral Oral  SpO2: 94% 95% 92% 94%  Weight:  80.7 kg    Height:        Intake/Output Summary (Last 24 hours) at 10/14/2020 1852 Last data filed at 10/14/2020 1325 Gross per 24  hour  Intake 644.95 ml  Output 250 ml  Net 394.95 ml   Filed Weights   10/12/20 0448 10/13/20 0404 10/14/20 0439  Weight: 79 kg 81.4 kg 80.7 kg    Examination:  General: No acute distress. Cardiovascular: Heart sounds show Jasiel Belisle regular rate, and rhythm.  Lungs: Clear to auscultation bilaterally  Abdomen: Soft, nontender, nondistended  Neurological: Alert and oriented 3. Moves all extremities 4. Cranial nerves II through XII grossly intact. Skin: Warm and dry. No rashes or lesions. Extremities: No clubbing or cyanosis. No edema.    Data Reviewed: I have personally reviewed following labs and imaging studies  CBC: Recent Labs  Lab 10/08/20 1641 10/09/20 0020 10/11/20 0403 10/11/20 2302 10/12/20 0802 10/13/20 0153 10/14/20 0336  WBC 8.7   < > 10.5 7.9 8.8 8.9 7.2  NEUTROABS 7.1  --  8.2*  --  8.2*  --  6.0  HGB 14.0   < > 11.5* 12.6 12.3 11.9* 11.6*  HCT RESULTS UNAVAILABLE DUE TO INTERFERING SUBSTANCE   < > 31.2* 33.8* 33.4* 32.2* 31.5*  MCV RESULTS UNAVAILABLE DUE TO INTERFERING SUBSTANCE   < > 90.4 89.4 89.5 90.7 90.0  PLT 213   < > 195 213  234 192 203   < > = values in this interval not displayed.    Basic Metabolic Panel: Recent Labs  Lab 10/09/20 0020 10/09/20 0807 10/10/20 0346 10/11/20 0403 10/11/20 2302 10/12/20 0802 10/13/20 0153 10/14/20 0336  NA 124*   < > 125* 127* 129* 131* 132* 130*  K 3.6   < > 4.3 3.9 4.0 4.0 4.4 4.1  CL 85*   < > 89* 90* 86* 88* 88* 87*  CO2 29   < > 31 32 34* 35* 31 37*  GLUCOSE 106*   < > 106* 97 135* 140* 105* 99  BUN <5*   < > 7* 10 11 11 16 20   CREATININE 0.51   < > 0.63 0.48 0.49 0.52 0.62 0.63  CALCIUM 8.2*   < > 8.5* 8.9 9.1 9.4 9.5 9.1  MG 2.6*  --  1.8 1.6* 1.4*  --  1.6* 1.9  PHOS 3.4  --   --   --   --   --   --  3.3   < > = values in this interval not displayed.    GFR: Estimated Creatinine Clearance: 66.3 mL/min (by C-G formula based on SCr of 0.63 mg/dL).  Liver Function Tests: Recent Labs  Lab  10/11/20 0403 10/11/20 2302 10/12/20 0802 10/13/20 0153 10/14/20 0336  AST 17 21 22 24 22   ALT 13 14 14 14 17   ALKPHOS 101 97 98 77 75  BILITOT 0.7 0.6 1.0 0.9 1.0  PROT 5.6* 6.0* 6.1* 5.7* 5.5*  ALBUMIN 3.1* 3.3* 3.4* 3.2* 3.1*    CBG: Recent Labs  Lab 10/13/20 0610 10/13/20 1118 10/13/20 1602 10/14/20 0607 10/14/20 1147  GLUCAP 100* 130* 150* 105* 123*     Recent Results (from the past 240 hour(s))  Resp Panel by RT-PCR (Flu Yaretsi Humphres&B, Covid) Nasopharyngeal Swab     Status: None   Collection Time: 10/08/20  5:33 PM   Specimen: Nasopharyngeal Swab; Nasopharyngeal(NP) swabs in vial transport medium  Result Value Ref Range Status   SARS Coronavirus 2 by RT PCR NEGATIVE NEGATIVE Final    Comment: (NOTE) SARS-CoV-2 target nucleic acids are NOT DETECTED.  The SARS-CoV-2 RNA is generally detectable in upper respiratory specimens during the acute phase of infection. The lowest concentration of SARS-CoV-2 viral copies this assay can detect is 138 copies/mL. Rhone Ozaki negative result does not preclude SARS-Cov-2 infection and should not be used as the sole basis for treatment or other patient management decisions. Nigil Braman negative result may occur with  improper specimen collection/handling, submission of specimen other than nasopharyngeal swab, presence of viral mutation(s) within the areas targeted by this assay, and inadequate number of viral copies(<138 copies/mL). Nykiah Ma negative result must be combined with clinical observations, patient history, and epidemiological information. The expected result is Negative.  Fact Sheet for Patients:  EntrepreneurPulse.com.au  Fact Sheet for Healthcare Providers:  IncredibleEmployment.be  This test is no t yet approved or cleared by the [place] FDA and  has been authorized for detection and/or diagnosis of SARS-CoV-2 by FDA under an Emergency Use Authorization (EUA). This EUA will remain  in effect (meaning  this test can be used) for the duration of the COVID-19 declaration under Section 564(b)(1) of the Act, 21 U.S.C.section 360bbb-3(b)(1), unless the authorization is terminated  or revoked sooner.       Influenza [place] by PCR NEGATIVE NEGATIVE Final   Influenza B by PCR NEGATIVE NEGATIVE Final    Comment: (NOTE) The Xpert Xpress SARS-CoV-2/FLU/RSV plus assay  is intended as an aid in the diagnosis of influenza from Nasopharyngeal swab specimens and should not be used as Briahnna Harries sole basis for treatment. Nasal washings and aspirates are unacceptable for Xpert Xpress SARS-CoV-2/FLU/RSV testing.  Fact Sheet for Patients: BloggerCourse.com  Fact Sheet for Healthcare Providers: SeriousBroker.it  This test is not yet approved or cleared by the [place] FDA and has been authorized for detection and/or diagnosis of SARS-CoV-2 by FDA under an Emergency Use Authorization (EUA). This EUA will remain in effect (meaning this test can be used) for the duration of the COVID-19 declaration under Section 564(b)(1) of the Act, 21 U.S.C. section 360bbb-3(b)(1), unless the authorization is terminated or revoked.  Performed at [facility], [address], [place], [place] [address]          Radiology Studies: CT ANGIO CHEST PE W OR WO CONTRAST  Result Date: 10/13/2020 CLINICAL DATA:  Positive D-dimer.  Shortness of breath EXAM: CT ANGIOGRAPHY CHEST WITH CONTRAST TECHNIQUE: Multidetector CT imaging of the chest was performed using the standard protocol during bolus administration of intravenous contrast. Multiplanar CT image reconstructions and MIPs were obtained to evaluate the vascular anatomy. CONTRAST:  5mL OMNIPAQUE IOHEXOL 350 MG/ML SOLN COMPARISON:  None. FINDINGS: Cardiovascular: No filling defects in the pulmonary arteries to suggest pulmonary emboli. Heart is mildly enlarged. Aorta normal caliber. Scattered aortic calcifications.  Mediastinum/Nodes: No mediastinal, hilar, or axillary adenopathy. Trachea and esophagus are unremarkable. Thyroid unremarkable. Lungs/Pleura: Small right pleural effusion. Atelectasis or consolidation in the right lower lobe. No confluent opacity on the left. Upper Abdomen: Nodular contours of the liver suggest cirrhosis. Musculoskeletal: Chest wall soft tissues are unremarkable. Multiple compression fractures in the mid to lower thoracic spine and upper lumbar spine. Review of the MIP images confirms the above findings. IMPRESSION: Cardiomegaly. No evidence of pulmonary embolus. Small right pleural effusion with right lower lobe atelectasis or infiltrate. Multiple compression fractures in the mid to lower thoracic spine and upper lumbar spine. Suspect changes of cirrhosis. Aortic Atherosclerosis (ICD10-I70.0). Electronically Signed   By: Charlett Nose M.D.   On: 10/13/2020 17:07   MR CERVICAL SPINE WO CONTRAST  Result Date: 10/13/2020 CLINICAL DATA:  Neck and back pain after fall.  [place]. EXAM: MRI CERVICAL SPINE WITHOUT CONTRAST TECHNIQUE: Multiplanar, multisequence MR imaging of the cervical spine was performed. No intravenous contrast was administered. COMPARISON:  Thoracic MRI 10/09/2020 FINDINGS: Alignment: Slight C6-7 anterolisthesis. Vertebrae: No fracture, evidence of discitis, or bone lesion. Cord: Hydromyelia seen from C4-5 into the thoracic spine, measuring up to 2-3 mm in the thoracic spine. No underlying cord swelling, mass, or impingement. Posterior Fossa, vertebral arteries, paraspinal tissues: Mild T2 hyperintensity in the pons attributed to chronic small vessel ischemia. Disc levels: Disc narrowing and bulging especially at C3-4 to C5-6. Mild facet spurring and ligamentum flavum buckling at the same levels. Diffusely patent spinal canal. 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 No underlying cord lesion, compression, or edema. Ordinary cervical spine  degeneration. Electronically Signed   By: Marnee Spring M.D.   On: 10/13/2020 09:19        Scheduled Meds: . cholecalciferol  1,000 Units Oral Daily  . citalopram  20 mg Oral Daily  . diclofenac Sodium  2 g Topical QID  . diltiazem  30 mg Oral Q6H  . docusate sodium  100 mg Oral Daily  . feeding supplement  237 mL Oral BID BM  . hydrALAZINE  50 mg Oral TID  . levothyroxine  100 mcg Oral Q0600  . lisinopril  40 mg Oral Daily  . omega-3 acid ethyl esters  1 g Oral Daily  . pantoprazole  40 mg Oral QHS  . predniSONE  40 mg Oral Q breakfast   Continuous Infusions: . [START ON 10/17/2020]  ceFAZolin (ANCEF) IV    . diltiazem (CARDIZEM) infusion 5 mg/hr (10/14/20 1342)  . heparin 1,150 Units/hr (10/14/20 1621)     LOS: 6 days    Time spent: over 28 min    Fayrene Helper, MD [facility]   To contact the attending provider between 7A-7P or the covering provider during after hours 7P-7A, please log into the web site [email or web address] and access using universal  password for that web site. If you do not have the password, please call the hospital operator.  10/14/2020, 6:52 PM

## 2020-10-14 NOTE — Progress Notes (Signed)
ANTICOAGULATION CONSULT NOTE - Follow Up Consult  Pharmacy Consult for Heparin Indication: atrial fibrillation  No Active Allergies  Patient Measurements: Height: 5\' 4"  (162.6 cm) Weight: 80.7 kg (177 lb 14.6 oz) IBW/kg (Calculated) : 54.7 Heparin Dosing Weight: 72 kg  Vital Signs: Temp: 98.1 F (36.7 C) (04/22 0727) Temp Source: Oral (04/22 0727) BP: 150/97 (04/22 0727) Pulse Rate: 93 (04/22 0727)  Labs: Recent Labs    10/12/20 0802 10/13/20 0153 10/13/20 1033 10/14/20 0336  HGB 12.3 11.9*  --  11.6*  HCT 33.4* 32.2*  --  31.5*  PLT 234 192  --  203  HEPARINUNFRC  --  0.30 0.32 0.25*  CREATININE 0.52 0.62  --  0.63    Estimated Creatinine Clearance: 66.3 mL/min (by C-G formula based on SCr of 0.63 mg/dL).  Assessment: 70 yr old female admitted on 10/08/20 with back/abd pain, along with hypokalemia (2.5) and hypomag (1.1). Pt with new dx of afib with RVR. Plan for kyphoplasty; pharmacy was consulted to dose IV heparin for atrial fibrillation. Of note, pt with cirrhosis and portal hypertension. Pt was on no anticoagulation PTA. LFTs normal.    Heparin level was low therapeutic (0.32) on 1000 units/hr yesterday, now just below goal (0.25). CBC stable.  Goal of Therapy:  Heparin level 0.3-0.7 units/ml Monitor platelets by anticoagulation protocol: Yes   Plan:   Increase heparin drip to 1150 units/hr.  Heparin level ~8 hrs after rate change.  Daily heparin level and CBC.  Follow up timing of kyphoplasty and holding heparin for procedure.  [name], [place] 10/14/2020,7:34 AM

## 2020-10-14 NOTE — Progress Notes (Signed)
ANTICOAGULATION CONSULT NOTE - Follow Up Consult  Pharmacy Consult for Heparin Indication: atrial fibrillation  No Active Allergies  Patient Measurements: Height: 5\' 4"  (162.6 cm) Weight: 80.7 kg (177 lb 14.6 oz) IBW/kg (Calculated) : 54.7 Heparin Dosing Weight: 72 kg  Vital Signs: Temp: 98.6 F (37 C) (04/22 1324) Temp Source: Oral (04/22 1324) BP: 117/81 (04/22 1324) Pulse Rate: 60 (04/22 1324)  Labs: Recent Labs    10/12/20 0802 10/12/20 0802 10/13/20 0153 10/13/20 1033 10/14/20 0336 10/14/20 1614  HGB 12.3  --  11.9*  --  11.6*  --   HCT 33.4*  --  32.2*  --  31.5*  --   PLT 234  --  192  --  203  --   HEPARINUNFRC  --    < > 0.30 0.32 0.25* 0.58  CREATININE 0.52  --  0.62  --  0.63  --    < > = values in this interval not displayed.    Estimated Creatinine Clearance: 66.3 mL/min (by C-G formula based on SCr of 0.63 mg/dL).  Assessment: 70 yr old female admitted on 10/08/20 with back/abd pain, along with hypokalemia (2.5) and hypomag (1.1). Pt with new dx of afib with RVR. Plan for kyphoplasty; pharmacy was consulted to dose IV heparin for atrial fibrillation. Of note, pt with cirrhosis and portal hypertension. Pt was on no anticoagulation PTA. LFTs normal.  Heparin level is therapeutic (0.58) on 1150 units/hr. CBC stable.  Goal of Therapy:  Heparin level 0.3-0.7 units/ml Monitor platelets by anticoagulation protocol: Yes   Plan:  Continue heparin drip at 1150 units/hr.  Daily heparin level and CBC.  Kyphoplasty tentatively planned for 10/17/20 - f/u holding heparin for procedure.  Alanda Slim, PharmD, [facility] Clinical Pharmacist Please see AMION for all Pharmacists' Contact Phone Numbers 10/14/2020, 6:16 PM

## 2020-10-14 NOTE — Progress Notes (Signed)
Progress Note  Patient Name: [name] Date of Encounter: 10/14/2020  [facility] Cardiologist: Elouise Munroe, MD   Subjective   Feels same, no CP, SOB or palpitations Inpatient Medications    Scheduled Meds: . cholecalciferol  1,000 Units Oral Daily  . citalopram  20 mg Oral Daily  . diclofenac Sodium  2 g Topical QID  . docusate sodium  100 mg Oral Daily  . feeding supplement  237 mL Oral BID BM  . hydrALAZINE  25 mg Oral TID  . levothyroxine  100 mcg Oral Q0600  . lisinopril  40 mg Oral Daily  . omega-3 acid ethyl esters  1 g Oral Daily  . pantoprazole  40 mg Oral QHS  . predniSONE  40 mg Oral Q breakfast   Continuous Infusions: . diltiazem (CARDIZEM) infusion 5 mg/hr (10/13/20 1322)  . heparin 1,150 Units/hr (10/14/20 0758)   PRN Meds: acetaminophen, dextromethorphan-guaiFENesin, iohexol, ipratropium-albuterol, labetalol, metoprolol tartrate, morphine injection, oxyCODONE, polyethylene glycol, senna-docusate   Vital Signs    Vitals:   10/13/20 1604 10/13/20 2258 10/14/20 0439 10/14/20 0727  BP: (!) 155/99 (!) 151/81 (!) 143/82 (!) 150/97  Pulse: (!) 108 86 84 93  Resp: 18 18 18 17   Temp: 97.8 F (36.6 C) 98.4 F (36.9 C) 97.9 F (36.6 C) 98.1 F (36.7 C)  TempSrc: Oral Oral Oral Oral  SpO2: 92% 94% 95% 92%  Weight:   80.7 kg   Height:        Intake/Output Summary (Last 24 hours) at 10/14/2020 1134 Last data filed at 10/14/2020 1324 Gross per 24 hour  Intake 764.08 ml  Output 250 ml  Net 514.08 ml   Last 3 Weights 10/14/2020 10/13/2020 10/12/2020  Weight (lbs) 177 lb 14.6 oz 179 lb 7.3 oz 174 lb 2.6 oz  Weight (kg) 80.7 kg 81.4 kg 79 kg      Telemetry    Afib, rate controlled - Personally Reviewed  ECG    No new - Personally Reviewed  Physical Exam   GEN: No acute distress.   Neck: No JVD Cardiac: iRRR, no murmurs, rubs, or gallops.  Respiratory: Clear to auscultation bilaterally. GI: Soft, nontender, non-distended  MS: No  edema; No deformity. Neuro:  Nonfocal  Psych: Normal affect    Labs    High Sensitivity Troponin:  No results for input(s): TROPONINIHS in the last 720 hours.    Chemistry Recent Labs  Lab 10/12/20 0802 10/13/20 0153 10/14/20 0336  NA 131* 132* 130*  K 4.0 4.4 4.1  CL 88* 88* 87*  CO2 35* 31 37*  GLUCOSE 140* 105* 99  BUN 11 16 20   CREATININE 0.52 0.62 0.63  CALCIUM 9.4 9.5 9.1  PROT 6.1* 5.7* 5.5*  ALBUMIN 3.4* 3.2* 3.1*  AST 22 24 22   ALT 14 14 17   ALKPHOS 98 77 75  BILITOT 1.0 0.9 1.0  GFRNONAA >60 >60 >60  ANIONGAP 8 13 6      Hematology Recent Labs  Lab 10/12/20 0802 10/13/20 0153 10/14/20 0336  WBC 8.8 8.9 7.2  RBC 3.73* 3.55* 3.50*  HGB 12.3 11.9* 11.6*  HCT 33.4* 32.2* 31.5*  MCV 89.5 90.7 90.0  MCH 33.0 33.5 33.1  MCHC 36.8* 37.0* 36.8*  RDW 12.9 13.1 13.1  PLT 234 192 203    BNP Recent Labs  Lab 10/11/20 2305 10/12/20 0802  BNP 949.4* 902.7*     DDimer No results for input(s): DDIMER in the last 168 hours.   Radiology  CT ANGIO CHEST PE W OR WO CONTRAST  Result Date: 10/13/2020 CLINICAL DATA:  Positive D-dimer.  Shortness of breath EXAM: CT ANGIOGRAPHY CHEST WITH CONTRAST TECHNIQUE: Multidetector CT imaging of the chest was performed using the standard protocol during bolus administration of intravenous contrast. Multiplanar CT image reconstructions and MIPs were obtained to evaluate the vascular anatomy. CONTRAST:  63mL OMNIPAQUE IOHEXOL 350 MG/ML SOLN COMPARISON:  None. FINDINGS: Cardiovascular: No filling defects in the pulmonary arteries to suggest pulmonary emboli. Heart is mildly enlarged. Aorta normal caliber. Scattered aortic calcifications. Mediastinum/Nodes: No mediastinal, hilar, or axillary adenopathy. Trachea and esophagus are unremarkable. Thyroid unremarkable. Lungs/Pleura: Small right pleural effusion. Atelectasis or consolidation in the right lower lobe. No confluent opacity on the left. Upper Abdomen: Nodular contours of the  liver suggest cirrhosis. Musculoskeletal: Chest wall soft tissues are unremarkable. Multiple compression fractures in the mid to lower thoracic spine and upper lumbar spine. Review of the MIP images confirms the above findings. IMPRESSION: Cardiomegaly. No evidence of pulmonary embolus. Small right pleural effusion with right lower lobe atelectasis or infiltrate. Multiple compression fractures in the mid to lower thoracic spine and upper lumbar spine. Suspect changes of cirrhosis. Aortic Atherosclerosis (ICD10-I70.0). Electronically Signed   By: Rolm Baptise M.D.   On: 10/13/2020 17:07   MR CERVICAL SPINE WO CONTRAST  Result Date: 10/13/2020 CLINICAL DATA:  Neck and back pain after fall.  [place]. EXAM: MRI CERVICAL SPINE WITHOUT CONTRAST TECHNIQUE: Multiplanar, multisequence MR imaging of the cervical spine was performed. No intravenous contrast was administered. COMPARISON:  Thoracic MRI 10/09/2020 FINDINGS: Alignment: Slight C6-7 anterolisthesis. Vertebrae: No fracture, evidence of discitis, or bone lesion. Cord: Hydromyelia seen from C4-5 into the thoracic spine, measuring up to 2-3 mm in the thoracic spine. No underlying cord swelling, mass, or impingement. Posterior Fossa, vertebral arteries, paraspinal tissues: Mild T2 hyperintensity in the pons attributed to chronic small vessel ischemia. Disc levels: Disc narrowing and bulging especially at C3-4 to C5-6. Mild facet spurring and ligamentum flavum buckling at the same levels. Diffusely patent spinal canal. 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 No underlying cord lesion, compression, or edema. Ordinary cervical spine degeneration. Electronically Signed   By: Monte Fantasia M.D.   On: 10/13/2020 09:19   VAS [place] LOWER EXTREMITY VENOUS (DVT)  Result Date: 10/12/2020  Lower Venous DVT Study Indications: Atrial fibrillation.  Limitations: Poor ultrasound/tissue interface. Comparison Study: No prior studies. Performing  Technologist: Darlin Coco RDMS,RVT  Examination Guidelines: A complete evaluation includes B-mode imaging, spectral Doppler, color Doppler, and power Doppler as needed of all accessible portions of each vessel. Bilateral testing is considered an integral part of a complete examination. Limited examinations for reoccurring indications may be performed as noted. The reflux portion of the exam is performed with the patient in reverse Trendelenburg.  +---------+---------------+---------+-----------+----------+-------------------+ RIGHT    CompressibilityPhasicitySpontaneityPropertiesThrombus Aging      +---------+---------------+---------+-----------+----------+-------------------+ CFV      Full           Yes      Yes                                      +---------+---------------+---------+-----------+----------+-------------------+ SFJ      Full                                                             +---------+---------------+---------+-----------+----------+-------------------+  FV Prox  Full                                                             +---------+---------------+---------+-----------+----------+-------------------+ FV Mid   Full                                                             +---------+---------------+---------+-----------+----------+-------------------+ FV DistalFull                                                             +---------+---------------+---------+-----------+----------+-------------------+ PFV      Full                                                             +---------+---------------+---------+-----------+----------+-------------------+ POP      Full           Yes      Yes                                      +---------+---------------+---------+-----------+----------+-------------------+ PTV      Full                                                              +---------+---------------+---------+-----------+----------+-------------------+ PERO                                                  Not well visualized +---------+---------------+---------+-----------+----------+-------------------+   +---------+---------------+---------+-----------+----------+-------------------+ LEFT     CompressibilityPhasicitySpontaneityPropertiesThrombus Aging      +---------+---------------+---------+-----------+----------+-------------------+ CFV      Full           Yes      Yes                                      +---------+---------------+---------+-----------+----------+-------------------+ SFJ      Full                                                             +---------+---------------+---------+-----------+----------+-------------------+ FV Prox  Full                                                             +---------+---------------+---------+-----------+----------+-------------------+  FV Mid   Full                                                             +---------+---------------+---------+-----------+----------+-------------------+ FV DistalFull                                                             +---------+---------------+---------+-----------+----------+-------------------+ PFV      Full                                                             +---------+---------------+---------+-----------+----------+-------------------+ POP      Full           Yes      Yes                                      +---------+---------------+---------+-----------+----------+-------------------+ PTV      Full                                                             +---------+---------------+---------+-----------+----------+-------------------+ PERO                                                  Not well visualized +---------+---------------+---------+-----------+----------+-------------------+      Summary: RIGHT: - There is no evidence of deep vein thrombosis in the lower extremity. However, portions of this examination were limited- see technologist comments above.  - No cystic structure found in the popliteal fossa.  LEFT: - There is no evidence of deep vein thrombosis in the lower extremity. However, portions of this examination were limited- see technologist comments above.  - No cystic structure found in the popliteal fossa.  *See table(s) above for measurements and observations. Electronically signed by Jamelle Haring on 10/12/2020 at 4:31:33 PM.    Final     Cardiac Studies   Echo 10/10/20  IMPRESSIONS    1. Left ventricular ejection fraction, by estimation, is 55 to 60%. The  left ventricle has normal function. The left ventricle has no regional  wall motion abnormalities. There is moderate asymmetric hypertrophy of the  basal septal segment. The rest of  the LV segments demonstrate mild left ventricular hypertrophy. Left  ventricular diastolic parameters are consistent with Grade I diastolic  dysfunction (impaired relaxation). Elevated left atrial pressure.  2. Right ventricular systolic function is normal. The right ventricular  size is mildly enlarged. There is severely elevated pulmonary artery  systolic pressure. The estimated right ventricular systolic pressure is  [id number] mmHg.  3. Left atrial size was severely dilated.  4. Right atrial size was mildly dilated.  5. The mitral valve is abnormal. There are mildly elevated gradients with  flow acceleration noted across the mitral valve with mean gradient  at HR 77bpm. MVA by continuity 2.7cm2 with no significant mitral stenosis.  Mild mitral valve  regurgitation.  6. The aortic valve is tricuspid. There is mild calcification of the  aortic valve. There is moderate thickening of the aortic valve. The LCC  appears fixed with trivial AR. Mild to moderate aortic valve  sclerosis/calcification is present, without any   evidence of aortic stenosis.  7. The inferior vena cava is normal in size with greater than 50%  respiratory variability, suggesting right atrial pressure of 3 mmHg.   Comparison(s): Compared to prior TTE in 2016, there is now severe  pulmonary HTN present with PASP (previously ).   FINDINGS  Left Ventricle: Left ventricular ejection fraction, by estimation, is 55  to 60%. The left ventricle has normal function. The left ventricle has no  regional wall motion abnormalities. The left ventricular internal cavity  size was normal in size. There is  moderate asymmetric hypertrophy of the basal septal segment. The rest of  the LV segments demonstrate mild left ventricular hypertrophy. Left  ventricular diastolic parameters are consistent with Grade I diastolic  dysfunction (impaired relaxation).  Elevated left atrial pressure.   Right Ventricle: The right ventricular size is mildly enlarged. No  increase in right ventricular wall thickness. Right ventricular systolic  function is normal. There is severely elevated pulmonary artery systolic  pressure. The tricuspid regurgitant  velocity is 3.80 m/s, and with an assumed right atrial pressure of 3 mmHg,  the estimated right ventricular systolic pressure is 60.8 mmHg.   Left Atrium: Left atrial size was severely dilated.   Right Atrium: Right atrial size was mildly dilated.   Pericardium: There is no evidence of pericardial effusion.   Mitral Valve: There are mildly elevated gradients with flow acceleration  noted across the mitral valve with mean gradient at HR 77bpm. MVA by  continuity 2.7cm2 with no significant stenosis. The mitral valve is  abnormal. There is moderate thickening  of the mitral valve leaflet(s). There is moderate calcification of the  mitral valve leaflet(s). Moderate mitral annular calcification. Mild  mitral valve regurgitation. No evidence of mitral valve stenosis.   Tricuspid Valve: The  tricuspid valve is normal in structure. Tricuspid  valve regurgitation is mild.   Aortic Valve: The aortic valve is tricuspid. There is mild calcification  of the aortic valve. There is moderate thickening of the aortic valve.  Aortic valve regurgitation LCC is fixed with trivial AR. Mild to moderate  aortic valve sclerosis/calcification  is present, without any evidence of aortic stenosis.   Pulmonic Valve: The pulmonic valve was normal in structure. Pulmonic valve  regurgitation is trivial.   Aorta: The aortic root and ascending aorta are structurally normal, with  no evidence of dilitation.   Venous: The inferior vena cava is normal in size with greater than 50%  respiratory variability, suggesting right atrial pressure of 3 mmHg.   IAS/Shunts: No atrial level shunt detected by color flow Doppler.    Patient Profile     70 y.o. female with a hx of HTN, GERD, hypothyroid, liver cirrhosis, + tobacco use, COPD, admitted 10/08/20 with back and abd pain along with hypokalemia,(K+ 2.5, Mg+ 1.1) hypomg+ hyponatremia and new diagnosis of atrial fib RVR.  Assessment & Plan    Atrial fib with RVR - now rate controlled on IV diltiazem - plan to continue through perioperative period to stabilize HR for procedure. Also on IV heparin. Continue in perioperative period. Consider adding diltiazem 30 mg QID with a goal of trying to eventually wean diltiazem drip.   PHTN with RVSP 61 mmHg - may be multifactorial from COPD, cirrhosis, suspect possible OSA as well - needs further outpatient workup.   HTN - will increase dose of hydralazine to 50 mg TID, continue lisinopril and diltiazem IV.     For questions or updates, please contact [place] Please consult [email or web address] for contact info under        Signed, Elouise Munroe, MD  10/14/2020, 11:34 AM

## 2020-10-14 NOTE — Progress Notes (Signed)
Patient tentatively scheduled for kyphoplasty procedure with Dr. Estanislado Pandy 10/17/20. Procedure had been on hold secondary to atrial fibrillation with RVR. This is now under control.   Patient will be NPO at midnight on 10/17/20. AM lab orders have been placed; 2G ancef ordered which will be given during the procedure.   Patient has IV heparin infusing. This will need to be turned off the morning of the procedure. An IR nurse will call the bedside nurse with the timing of this.  Please call IR with any questions.  Soyla Dryer, [place] [phone] 10/14/2020, 1:48 PM

## 2020-10-15 DIAGNOSIS — I4819 Other persistent atrial fibrillation: Secondary | ICD-10-CM

## 2020-10-15 LAB — CBC
HCT: 31.5 % — ABNORMAL LOW (ref 36.0–46.0)
Hemoglobin: 11.7 g/dL — ABNORMAL LOW (ref 12.0–15.0)
MCH: 33.4 pg (ref 26.0–34.0)
MCHC: 37.1 g/dL — ABNORMAL HIGH (ref 30.0–36.0)
MCV: 90 fL (ref 80.0–100.0)
Platelets: 190 10*3/uL (ref 150–400)
RBC: 3.5 MIL/uL — ABNORMAL LOW (ref 3.87–5.11)
RDW: 13.2 % (ref 11.5–15.5)
WBC: 8.4 10*3/uL (ref 4.0–10.5)
nRBC: 0 % (ref 0.0–0.2)

## 2020-10-15 LAB — BASIC METABOLIC PANEL
Anion gap: 5 (ref 5–15)
BUN: 15 mg/dL (ref 8–23)
CO2: 36 mmol/L — ABNORMAL HIGH (ref 22–32)
Calcium: 9 mg/dL (ref 8.9–10.3)
Chloride: 88 mmol/L — ABNORMAL LOW (ref 98–111)
Creatinine, Ser: 0.62 mg/dL (ref 0.44–1.00)
GFR, Estimated: >60 mL/min (ref >60)
Glucose, Bld: 93 mg/dL (ref 70–99)
Potassium: 4 mmol/L (ref 3.5–5.1)
Sodium: 129 mmol/L — ABNORMAL LOW (ref 135–145)

## 2020-10-15 LAB — HEPARIN LEVEL (UNFRACTIONATED): Heparin Unfractionated: 0.47 IU/mL (ref 0.30–0.70)

## 2020-10-15 MED ORDER — DILTIAZEM HCL-DEXTROSE 125-5 MG/125ML-% IV SOLN (PREMIX)
5.0000 mg/h | INTRAVENOUS | Status: DC
  Administered 2020-10-15 – 2020-10-16 (×2): 5 mg/h via INTRAVENOUS
  Filled 2020-10-15 (×2): qty 125

## 2020-10-15 NOTE — Progress Notes (Signed)
ANTICOAGULATION CONSULT NOTE - Follow Up Consult  Pharmacy Consult for Heparin Indication: atrial fibrillation  No Active Allergies  Patient Measurements: Height: 5\' 4"  (162.6 cm) Weight: 80.9 kg (178 lb 5.6 oz) IBW/kg (Calculated) : 54.7 Heparin Dosing Weight: 72 kg  Vital Signs: Temp: 98.2 F (36.8 C) (04/23 0334) Temp Source: Oral (04/23 0334) BP: 156/84 (04/23 0809) Pulse Rate: 85 (04/23 0809)  Labs: Recent Labs    10/13/20 0153 10/13/20 1033 10/14/20 0336 10/14/20 1614 10/15/20 0324  HGB 11.9*  --  11.6*  --   --   HCT 32.2*  --  31.5*  --   --   PLT 192  --  203  --   --   HEPARINUNFRC 0.30   < > 0.25* 0.58 0.47  CREATININE 0.62  --  0.63  --   --    < > = values in this interval not displayed.    Estimated Creatinine Clearance: 66.4 mL/min (by C-G formula based on SCr of 0.63 mg/dL).  Assessment: 70 yr old female admitted on 10/08/20 with back/abd pain, along with hypokalemia (2.5) and hypomag (1.1). Pt with new dx of afib with RVR. Plan for kyphoplasty; pharmacy was consulted to dose IV heparin for atrial fibrillation. Of note, pt with cirrhosis and portal hypertension. Pt was on no anticoagulation PTA. LFTs normal.  Heparin level is therapeutic (0.47) on 1150 units/hr. CBC stable.  Goal of Therapy:  Heparin level 0.3-0.7 units/ml Monitor platelets by anticoagulation protocol: Yes   Plan:  Continue heparin drip at 1150 units/hr.  Daily heparin level and CBC.  Kyphoplasty tentatively planned for 10/17/20 - f/u holding heparin for procedure.  Per cards  they are not convinced patient will be a good candidate for long-term anticoagulation given history of alcohol abuse, cirrhosis as well as history of GI bleed.    Norina Buzzard, PharmD PGY1 Pharmacy Resident 10/15/2020 8:20 AM

## 2020-10-15 NOTE — Progress Notes (Signed)
PROGRESS NOTE    [name]  BZJ:[id number] DOB: 1951/03/30 DOA: 10/08/2020 PCP: [name], MD   Chief Complaint  Patient presents with  . Back Pain  . Abdominal Pain   Brief Narrative:  70 year old with history of HTN, hypothyroidism, GERD, liver cirrhosis presents with abdominal and low back pain.  Upon admission patient had hyponatremia, hypokalemia, hypomagnesemia CT abdomen pelvis showed cirrhosis with portal hypertension, right pleural effusion with interval development of T10, T8 T12 and L1 compression fracture.  MRI confirmed these findings.  IR consulted who recommended kyphoplasty. Plan pending improvement in afib with RVR.   Assessment & Plan:   Principal Problem:   Back pain Active Problems:   HYPERTENSION, BENIGN ESSENTIAL   Hyponatremia   Hypothyroidism   Hypokalemia due to excessive gastrointestinal loss of potassium   Abdominal pain   Hypomagnesemia   Compression fracture of body of thoracic vertebra (HCC)   History of cirrhosis of liver  Atrial Fibrillation with RVR No prior hx, appears new onset Follow TSH (on low side, synthroid reduced), echo - will follow LE [place] (negative), CT PE protocol negative for PE, small right pleural effusion with right lower lobe atelectasis/infiltrate Has elevated BNP as well chadvasc at least 3 dilt gtt, start oral dilt - per cards, continue dilt gtt until kyphoplasty - may need to reeval anticoagulation in setting of hx etoh abuse, cirrhosis, GI bleed Will ask cardiology to see as well - appreciate assistance - PO and dilt gtt, heparin gtt   Acute Metabolic Encephalopathy Suspect this is 2/2 hospital delirium in setting of opiate use Blood gas with normal pCO2 TSH low, synthroid reduced.  Follow b12 (wnl), folate (wnl), ammonia (wnl).  Delirium precautions Improved today, though per discussion with daughter, this continues to wax/wane  Back pain due Thoracic compression fracture -Significant amount of  pain.  CT abdomen pelvis shows compression fracture T10, T12 and L1 -Pain control-oxycodone IR 10 mg and IV morphine as needed.  Bowel regimen. -IR consulted, MRI performed -> acute compression fx involving the T10 vertebral body with up to 60% height loss and 4 mm bony retropulsion, no significant spinal stenosis, acute compression fx involving T9 vertebral body with up to 25% height loss without significant bony retropulsion, late to subacute to chronic severe compression fractures involving the T12 and L1 vertebral bodies with 80-90% height loss and up to 4 mm bony retropulsion, no significant spinal stenosis.  Small syrinx measuring up to 3 mm involving the majority of the thoracic spinal cord, terminating at T8-9.   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 Kathyrn Sheriff  Abdominal pain, improved Liver cirrhosis with history of alcohol use History of cholecystectomy -Continue PPI.  Pain control -LFTs relatively normal, lipase normal -Right upper quadrant ultrasound-showed cirrhotic pathology -Acute hepatitis-negative -Echo- EF 55 to 60%, grade 1 DD, severely elevated pulmonary artery pressure - Outpatient GI follow-up, notified about her GI to arrange for follow-up -Endoscopy 06/2018- showed gastritis and multiple duodenal ulcer secondary to NSAID  Vitamin D deficiency - Supplement started  Daily tobacco use with  history of COPD, mild exacerbation - steroids, bronchodilators, I-S, flutter valve -- discussed  smoking cessation and discussed possibilities of meds for assistance with cessation, she declined  Hyponatremia -Secondary to cirrhosis.  Initially due to intravascular volume depletion required fluids with due to signs of volume overload and has been stopped for now  Hypokalemia, resolved - replace and follow as needed  Hypomagnesemia - replace and follow as needed  Hypothyroidism -Low TSH elevated T4.  Reduced to Synthroid 100 mcg daily  Essential hypertension: UnControlled -Continue lisinopril 40 mg daily.  Increase hydralazine 25 mg 3 times daily.  Add Norvasc 5 mg daily  Hyperlipidemia -Continue Lovaza  Hyperglycemia Follow a1c 4.7  DVT prophylaxis: SCD Code Status: full  Family Communication: none at bedside - daughter over phone 4/22 Disposition:   Status is: Inpatient  Remains inpatient appropriate because:Inpatient level of care appropriate due to severity of illness   Dispo: The patient is from: Home              Anticipated d/c is to: Home              Patient currently is not medically stable to d/c.   Difficult to place patient No       Consultants:   IR  cardiology  Procedures none  Antimicrobials: Anti-infectives (From admission, onward)   Start     Dose/Rate Route Frequency Ordered Stop   10/17/20 0800  ceFAZolin (ANCEF) IVPB 2g/100 mL premix        2 g 200 mL/hr over 30 Minutes Intravenous To Radiology 10/14/20 1335 10/18/20 0800   10/11/20 0600  ceFAZolin (ANCEF) IVPB 2g/100 mL premix        2 g 200 mL/hr over 30 Minutes Intravenous To Radiology 10/10/20 1448 10/12/20 0600      Subjective: C/o continued back pain, better when at rest  Objective: Vitals:   10/14/20 1938 10/15/20 0334 10/15/20 0809 10/15/20 1339  BP: 134/83 (!) 153/91 (!) 156/84 115/63  Pulse: 89 85 85 84  Resp: 18 18 18 17   Temp: 98.2 F (36.8 C) 98.2 F (36.8 C)  97.8 F (36.6 C)  TempSrc: Oral Oral  Oral  SpO2: 92% 93% 91% 94%  Weight:  80.9 kg     Height:        Intake/Output Summary (Last 24 hours) at 10/15/2020 1551 Last data filed at 10/15/2020 1340 Gross per 24 hour  Intake 480 ml  Output 600 ml  Net -120 ml   Filed Weights   10/13/20 0404 10/14/20 0439 10/15/20 0334  Weight: 81.4 kg 80.7 kg 80.9 kg    Examination:  General: No acute distress. Cardiovascular: Heart sounds show Genecis Veley regular rate, and rhythm.  Lungs: Clear to auscultation bilaterally  Abdomen: Soft, nontender, nondistended  Neurological: Alert and oriented 3. Moves all extremities 4. Cranial nerves II through XII grossly intact. Skin: Warm and dry. No rashes or lesions. Extremities: No clubbing or cyanosis. No edema.    Data Reviewed: I have personally reviewed following labs and imaging studies  CBC: Recent Labs  Lab 10/08/20 1641 10/09/20 0020 10/11/20 0403 10/11/20 2302 10/12/20 0802 10/13/20 0153 10/14/20 0336 10/15/20 0324  WBC 8.7   < > 10.5 7.9 8.8 8.9 7.2 8.4  NEUTROABS 7.1  --  8.2*  --  8.2*  --  6.0  --   HGB 14.0   < > 11.5* 12.6 12.3 11.9* 11.6* 11.7*  HCT RESULTS UNAVAILABLE DUE TO INTERFERING SUBSTANCE   < > 31.2*  33.8* 33.4* 32.2* 31.5* 31.5*  MCV RESULTS UNAVAILABLE DUE TO INTERFERING SUBSTANCE   < > 90.4 89.4 89.5 90.7 90.0 90.0  PLT 213   < > 195 213 234 192 203 190   < > = values in this interval not displayed.    Basic Metabolic Panel: Recent Labs  Lab 10/09/20 0020 10/09/20 8676 10/10/20 0346 10/11/20 0403 10/11/20 2302 10/12/20 0802 10/13/20 0153 10/14/20 0336 10/15/20 0852  NA 124*   < > 125* 127* 129* 131* 132* 130* 129*  K 3.6   < > 4.3 3.9 4.0 4.0 4.4 4.1 4.0  CL 85*   < > 89* 90* 86* 88* 88* 87* 88*  CO2 29   < > 31 32 34* 35* 31 37* 36*  GLUCOSE 106*   < > 106* 97 135* 140* 105* 99 93  BUN <5*   < > 7* 10 11 11 16 20 15   CREATININE 0.51   < > 0.63 0.48 0.49 0.52 0.62 0.63 0.62  CALCIUM 8.2*   < > 8.5* 8.9 9.1 9.4 9.5 9.1 9.0  MG 2.6*  --  1.8 1.6* 1.4*  --  1.6* 1.9  --   PHOS 3.4  --   --   --    --   --   --  3.3  --    < > = values in this interval not displayed.    GFR: Estimated Creatinine Clearance: 66.4 mL/min (by C-G formula based on SCr of 0.62 mg/dL).  Liver Function Tests: Recent Labs  Lab 10/11/20 0403 10/11/20 2302 10/12/20 0802 10/13/20 0153 10/14/20 0336  AST 17 21 22 24 22   ALT 13 14 14 14 17   ALKPHOS 101 97 98 77 75  BILITOT 0.7 0.6 1.0 0.9 1.0  PROT 5.6* 6.0* 6.1* 5.7* 5.5*  ALBUMIN 3.1* 3.3* 3.4* 3.2* 3.1*    CBG: Recent Labs  Lab 10/13/20 0610 10/13/20 1118 10/13/20 1602 10/14/20 0607 10/14/20 1147  GLUCAP 100* 130* 150* 105* 123*     Recent Results (from the past 240 hour(s))  Resp Panel by RT-PCR (Flu Tytianna Greenley&B, Covid) Nasopharyngeal Swab     Status: None   Collection Time: 10/08/20  5:33 PM   Specimen: Nasopharyngeal Swab; Nasopharyngeal(NP) swabs in vial transport medium  Result Value Ref Range Status   SARS Coronavirus 2 by RT PCR NEGATIVE NEGATIVE Final    Comment: (NOTE) SARS-CoV-2 target nucleic acids are NOT DETECTED.  The SARS-CoV-2 RNA is generally detectable in upper respiratory specimens during the acute phase of infection. The lowest concentration of SARS-CoV-2 viral copies this assay can detect is 138 copies/mL. Kateria Cutrona negative result does not preclude SARS-Cov-2 infection and should not be used as the sole basis for treatment or other patient management decisions. Chestina Komatsu negative result may occur with  improper specimen collection/handling, submission of specimen other than nasopharyngeal swab, presence of viral mutation(s) within the areas targeted by this assay, and inadequate number of viral copies(<138 copies/mL). Arbor Cohen negative result must be combined with clinical observations, patient history, and epidemiological information. The expected result is Negative.  Fact Sheet for Patients:  EntrepreneurPulse.com.au  Fact Sheet for Healthcare Providers:  IncredibleEmployment.be  This test is no  t yet approved or cleared by the [place] FDA and  has been authorized for detection and/or diagnosis of SARS-CoV-2 by FDA under an Emergency Use Authorization (EUA). This EUA will remain  in effect (meaning this test can be used) for the duration of the COVID-19 declaration under Section  564(b)(1) of the Act, 21 U.S.C.section 360bbb-3(b)(1), unless the authorization is terminated  or revoked sooner.       Influenza Raylee Strehl by PCR NEGATIVE NEGATIVE Final   Influenza B by PCR NEGATIVE NEGATIVE Final    Comment: (NOTE) The Xpert Xpress SARS-CoV-2/FLU/RSV plus assay is intended as an aid in the diagnosis of influenza from Nasopharyngeal swab specimens and should not be used as Lexianna Weinrich sole basis for treatment. Nasal washings and aspirates are unacceptable for Xpert Xpress SARS-CoV-2/FLU/RSV testing.  Fact Sheet for Patients: EntrepreneurPulse.com.au  Fact Sheet for Healthcare Providers: IncredibleEmployment.be  This test is not yet approved or cleared by the [place] FDA and has been authorized for detection and/or diagnosis of SARS-CoV-2 by FDA under an Emergency Use Authorization (EUA). This EUA will remain in effect (meaning this test can be used) for the duration of the COVID-19 declaration under Section 564(b)(1) of the Act, 21 U.S.C. section 360bbb-3(b)(1), unless the authorization is terminated or revoked.  Performed at [facility], [place] [address] [place], [place] [address]          Radiology Studies: CT ANGIO CHEST PE W OR WO CONTRAST  Result Date: 10/13/2020 CLINICAL DATA:  Positive D-dimer.  Shortness of breath EXAM: CT ANGIOGRAPHY CHEST WITH CONTRAST TECHNIQUE: Multidetector CT imaging of the chest was performed using the standard protocol during bolus administration of intravenous contrast. Multiplanar CT image reconstructions and MIPs were obtained to evaluate the vascular anatomy. CONTRAST:  90mL OMNIPAQUE IOHEXOL 350  MG/ML SOLN COMPARISON:  None. FINDINGS: Cardiovascular: No filling defects in the pulmonary arteries to suggest pulmonary emboli. Heart is mildly enlarged. Aorta normal caliber. Scattered aortic calcifications. Mediastinum/Nodes: No mediastinal, hilar, or axillary adenopathy. Trachea and esophagus are unremarkable. Thyroid unremarkable. Lungs/Pleura: Small right pleural effusion. Atelectasis or consolidation in the right lower lobe. No confluent opacity on the left. Upper Abdomen: Nodular contours of the liver suggest cirrhosis. Musculoskeletal: Chest wall soft tissues are unremarkable. Multiple compression fractures in the mid to lower thoracic spine and upper lumbar spine. Review of the MIP images confirms the above findings. IMPRESSION: Cardiomegaly. No evidence of pulmonary embolus. Small right pleural effusion with right lower lobe atelectasis or infiltrate. Multiple compression fractures in the mid to lower thoracic spine and upper lumbar spine. Suspect changes of cirrhosis. Aortic Atherosclerosis (ICD10-I70.0). Electronically Signed   By: Rolm Baptise M.D.   On: 10/13/2020 17:07        Scheduled Meds: . cholecalciferol  1,000 Units Oral Daily  . citalopram  20 mg Oral Daily  . diclofenac Sodium  2 g Topical QID  . diltiazem  30 mg Oral Q6H  . docusate sodium  100 mg Oral Daily  . feeding supplement  237 mL Oral BID BM  . hydrALAZINE  50 mg Oral TID  . levothyroxine  100 mcg Oral Q0600  . lisinopril  40 mg Oral Daily  . omega-3 acid ethyl esters  1 g Oral Daily  . pantoprazole  40 mg Oral QHS   Continuous Infusions: . [START ON 10/17/2020]  ceFAZolin (ANCEF) IV    . heparin 1,150 Units/hr (10/14/20 1621)     LOS: 7 days    Time spent: over 30 min    Fayrene Helper, MD [facility]   To contact the attending provider between 7A-7P or the covering provider during after hours 7P-7A, please log into the web site [email or web address] and access using universal [place] password  for that web site. If you do not have the password,  please call the hospital operator.  10/15/2020, 3:51 PM

## 2020-10-15 NOTE — Progress Notes (Signed)
Progress Note  Patient Name: [name] Date of Encounter: 10/15/2020  [facility] Cardiologist: Elouise Munroe, MD   Subjective   No CP or dyspnea  Inpatient Medications    Scheduled Meds: . cholecalciferol  1,000 Units Oral Daily  . citalopram  20 mg Oral Daily  . diclofenac Sodium  2 g Topical QID  . diltiazem  30 mg Oral Q6H  . docusate sodium  100 mg Oral Daily  . feeding supplement  237 mL Oral BID BM  . hydrALAZINE  50 mg Oral TID  . levothyroxine  100 mcg Oral Q0600  . lisinopril  40 mg Oral Daily  . omega-3 acid ethyl esters  1 g Oral Daily  . pantoprazole  40 mg Oral QHS  . predniSONE  40 mg Oral Q breakfast   Continuous Infusions: . [START ON 10/17/2020]  ceFAZolin (ANCEF) IV    . diltiazem (CARDIZEM) infusion 5 mg/hr (10/14/20 1342)  . heparin 1,150 Units/hr (10/14/20 1621)   PRN Meds: acetaminophen, dextromethorphan-guaiFENesin, iohexol, ipratropium-albuterol, labetalol, metoprolol tartrate, morphine injection, oxyCODONE, polyethylene glycol, senna-docusate   Vital Signs    Vitals:   10/14/20 0727 10/14/20 1324 10/14/20 1938 10/15/20 0334  BP: (!) 150/97 117/81 134/83 (!) 153/91  Pulse: 93 60 89 85  Resp: 17 18 18 18   Temp: 98.1 F (36.7 C) 98.6 F (37 C) 98.2 F (36.8 C) 98.2 F (36.8 C)  TempSrc: Oral Oral Oral Oral  SpO2: 92% 94% 92% 93%  Weight:    80.9 kg  Height:        Intake/Output Summary (Last 24 hours) at 10/15/2020 0629 Last data filed at 10/15/2020 0336 Gross per 24 hour  Intake 480 ml  Output 750 ml  Net -270 ml   Last 3 Weights 10/15/2020 10/14/2020 10/13/2020  Weight (lbs) 178 lb 5.6 oz 177 lb 14.6 oz 179 lb 7.3 oz  Weight (kg) 80.9 kg 80.7 kg 81.4 kg      Telemetry    Atrial fibrillation rate upper normal - Personally Reviewed   Physical Exam   GEN: No acute distress.   Neck: No JVD Cardiac: irregular Respiratory: Clear to auscultation bilaterally. GI: Soft, nontender, non-distended  MS: No  edema Neuro:  Nonfocal  Psych: Normal affect   Labs     Chemistry Recent Labs  Lab 10/12/20 0802 10/13/20 0153 10/14/20 0336  NA 131* 132* 130*  K 4.0 4.4 4.1  CL 88* 88* 87*  CO2 35* 31 37*  GLUCOSE 140* 105* 99  BUN 11 16 20   CREATININE 0.52 0.62 0.63  CALCIUM 9.4 9.5 9.1  PROT 6.1* 5.7* 5.5*  ALBUMIN 3.4* 3.2* 3.1*  AST 22 24 22   ALT 14 14 17   ALKPHOS 98 77 75  BILITOT 1.0 0.9 1.0  GFRNONAA >60 >60 >60  ANIONGAP 8 13 6      Hematology Recent Labs  Lab 10/12/20 0802 10/13/20 0153 10/14/20 0336  WBC 8.8 8.9 7.2  RBC 3.73* 3.55* 3.50*  HGB 12.3 11.9* 11.6*  HCT 33.4* 32.2* 31.5*  MCV 89.5 90.7 90.0  MCH 33.0 33.5 33.1  MCHC 36.8* 37.0* 36.8*  RDW 12.9 13.1 13.1  PLT 234 192 203    BNP Recent Labs  Lab 10/11/20 2305 10/12/20 0802  BNP 949.4* 902.7*     Radiology    CT ANGIO CHEST PE W OR WO CONTRAST  Result Date: 10/13/2020 CLINICAL DATA:  Positive D-dimer.  Shortness of breath EXAM: CT ANGIOGRAPHY CHEST WITH CONTRAST TECHNIQUE: Multidetector  CT imaging of the chest was performed using the standard protocol during bolus administration of intravenous contrast. Multiplanar CT image reconstructions and MIPs were obtained to evaluate the vascular anatomy. CONTRAST:  52mL OMNIPAQUE IOHEXOL 350 MG/ML SOLN COMPARISON:  None. FINDINGS: Cardiovascular: No filling defects in the pulmonary arteries to suggest pulmonary emboli. Heart is mildly enlarged. Aorta normal caliber. Scattered aortic calcifications. Mediastinum/Nodes: No mediastinal, hilar, or axillary adenopathy. Trachea and esophagus are unremarkable. Thyroid unremarkable. Lungs/Pleura: Small right pleural effusion. Atelectasis or consolidation in the right lower lobe. No confluent opacity on the left. Upper Abdomen: Nodular contours of the liver suggest cirrhosis. Musculoskeletal: Chest wall soft tissues are unremarkable. Multiple compression fractures in the mid to lower thoracic spine and upper lumbar spine.  Review of the MIP images confirms the above findings. IMPRESSION: Cardiomegaly. No evidence of pulmonary embolus. Small right pleural effusion with right lower lobe atelectasis or infiltrate. Multiple compression fractures in the mid to lower thoracic spine and upper lumbar spine. Suspect changes of cirrhosis. Aortic Atherosclerosis (ICD10-I70.0). Electronically Signed   By: Rolm Baptise M.D.   On: 10/13/2020 17:07   MR CERVICAL SPINE WO CONTRAST  Result Date: 10/13/2020 CLINICAL DATA:  Neck and back pain after fall.  [place]. EXAM: MRI CERVICAL SPINE WITHOUT CONTRAST TECHNIQUE: Multiplanar, multisequence MR imaging of the cervical spine was performed. No intravenous contrast was administered. COMPARISON:  Thoracic MRI 10/09/2020 FINDINGS: Alignment: Slight C6-7 anterolisthesis. Vertebrae: No fracture, evidence of discitis, or bone lesion. Cord: Hydromyelia seen from C4-5 into the thoracic spine, measuring up to 2-3 mm in the thoracic spine. No underlying cord swelling, mass, or impingement. Posterior Fossa, vertebral arteries, paraspinal tissues: Mild T2 hyperintensity in the pons attributed to chronic small vessel ischemia. Disc levels: Disc narrowing and bulging especially at C3-4 to C5-6. Mild facet spurring and ligamentum flavum buckling at the same levels. Diffusely patent spinal canal. 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 No underlying cord lesion, compression, or edema. Ordinary cervical spine degeneration. Electronically Signed   By: Monte Fantasia M.D.   On: 10/13/2020 09:19    Patient Profile     70 y.o. female with a hx of HTN, GERD, hypothyroid, liver cirrhosis, + tobacco use, COPD, admitted 10/08/20 with back and abd pain along with hypokalemia,(K+ 2.5, Mg+ 1.1) hypomg+ hyponatremiaand new diagnosis of atrial fib RVR.  Patient found to have T10, T8, T12 and L1 compression fracture.  Interventional radiology consulted who recommended kyphoplasty.   Echocardiogram this admission shows ejection fraction 55 to 91%, grade 1 diastolic dysfunction, mild right ventricular enlargement, severe pulmonary hypertension, severe left atrial enlargement, mild right atrial enlargement, mild mitral regurgitation.  TSH 0.241.  Free T4 1.48.  Assessment & Plan    1 persistent atrial fibrillation-heart rate appears to be reasonable.  We will continue IV Cardizem.  Plan to transition to oral Cardizem following kyphoplasty on Monday.  We will continue IV heparin for now.  I am not convinced patient will be a good candidate for long-term anticoagulation given history of alcohol abuse, cirrhosis as well as history of GI bleed.    2 vertebral fracture-plan is for kyphoplasty on Monday.  3 pulmonary hypertension-felt to be multifactorial including COPD, possible obstructive sleep apnea and cirrhosis.  4 hypertension-blood pressure is controlled.  Continue present medications and follow.  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  6 cirrhosis follow-up GI as an outpatient.  We will see again Monday.  Please call prior to then with questions.  For questions or updates, please contact [place] Please consult [email or web address] for contact info under        Signed, Kirk Ruths, MD  10/15/2020, 6:29 AM

## 2020-10-16 LAB — CBC WITH DIFFERENTIAL/PLATELET
Abs Immature Granulocytes: 0.04 10*3/uL (ref 0.00–0.07)
Basophils Absolute: 0 10*3/uL (ref 0.0–0.1)
Basophils Relative: 0 %
Eosinophils Absolute: 0 10*3/uL (ref 0.0–0.5)
Eosinophils Relative: 0 %
Hemoglobin: 11.2 g/dL — ABNORMAL LOW (ref 12.0–15.0)
Immature Granulocytes: 0 %
Lymphocytes Relative: 8 %
Lymphs Abs: 0.8 10*3/uL (ref 0.7–4.0)
Monocytes Absolute: 0.6 10*3/uL (ref 0.1–1.0)
Monocytes Relative: 6 %
Neutro Abs: 8.3 10*3/uL — ABNORMAL HIGH (ref 1.7–7.7)
Neutrophils Relative %: 86 %
Platelets: 125 10*3/uL — ABNORMAL LOW (ref 150–400)
WBC: 9.8 10*3/uL (ref 4.0–10.5)

## 2020-10-16 LAB — PHOSPHORUS: Phosphorus: 3.6 mg/dL (ref 2.5–4.6)

## 2020-10-16 LAB — COMPREHENSIVE METABOLIC PANEL
ALT: 25 U/L (ref 0–44)
AST: 26 U/L (ref 15–41)
Albumin: 3.2 g/dL — ABNORMAL LOW (ref 3.5–5.0)
Alkaline Phosphatase: 80 U/L (ref 38–126)
Anion gap: 7 (ref 5–15)
BUN: 15 mg/dL (ref 8–23)
CO2: 35 mmol/L — ABNORMAL HIGH (ref 22–32)
Calcium: 9.1 mg/dL (ref 8.9–10.3)
Chloride: 86 mmol/L — ABNORMAL LOW (ref 98–111)
Creatinine, Ser: 0.61 mg/dL (ref 0.44–1.00)
GFR, Estimated: >60 mL/min (ref >60)
Glucose, Bld: 92 mg/dL (ref 70–99)
Potassium: 4.2 mmol/L (ref 3.5–5.1)
Sodium: 128 mmol/L — ABNORMAL LOW (ref 135–145)
Total Bilirubin: 0.8 mg/dL (ref 0.3–1.2)
Total Protein: 5.8 g/dL — ABNORMAL LOW (ref 6.5–8.1)

## 2020-10-16 LAB — CBC
Hemoglobin: 12.1 g/dL (ref 12.0–15.0)
Platelets: 182 10*3/uL (ref 150–400)
WBC: 10.5 10*3/uL (ref 4.0–10.5)

## 2020-10-16 LAB — HEPARIN LEVEL (UNFRACTIONATED): Heparin Unfractionated: 0.57 IU/mL (ref 0.30–0.70)

## 2020-10-16 LAB — MAGNESIUM: Magnesium: 1.8 mg/dL (ref 1.7–2.4)

## 2020-10-16 LAB — OSMOLALITY: Osmolality: 266 mOsm/kg — ABNORMAL LOW (ref 275–295)

## 2020-10-16 NOTE — Progress Notes (Signed)
ANTICOAGULATION CONSULT NOTE - Follow Up Consult  Pharmacy Consult for Heparin Indication: atrial fibrillation  No Active Allergies  Patient Measurements: Height: 5\' 4"  (162.6 cm) Weight: 82.9 kg (182 lb 12.2 oz) IBW/kg (Calculated) : 54.7 Heparin Dosing Weight: 72 kg  Vital Signs: Temp: 98 F (36.7 C) (04/24 0311) Temp Source: Oral (04/24 0311) BP: 154/85 (04/24 0311) Pulse Rate: 84 (04/24 0311)  Labs: Recent Labs    10/14/20 0336 10/14/20 1614 10/15/20 0324 10/15/20 0852 10/16/20 0213 10/16/20 0250 10/16/20 0908  HGB 11.6*  --  11.7*  --  11.2*  --  12.1  HCT 31.5*  --  31.5*  --  RESULTS UNAVAILABLE DUE TO INTERFERING SUBSTANCE  --  RESULTS UNAVAILABLE DUE TO INTERFERING SUBSTANCE  PLT 203  --  190  --  125*  --  182  HEPARINUNFRC 0.25* 0.58 0.47  --   --  0.57  --   CREATININE 0.63  --   --  0.62 0.61  --   --     Estimated Creatinine Clearance: 67.2 mL/min (by C-G formula based on SCr of 0.61 mg/dL).  Assessment: 70 yr old female admitted on 10/08/20 with back/abd pain, along with hypokalemia (2.5) and hypomag (1.1). Pt with new dx of afib with RVR. Plan for kyphoplasty; pharmacy was consulted to dose IV heparin for atrial fibrillation. Of note, pt with cirrhosis and portal hypertension. Pt was on no anticoagulation PTA. LFTs normal.  Heparin level is therapeutic (0.57) on 1150 units/hr. First CBC this morning had lab interference, so repeated CBC. Repeat CBC has stable Hgb and PLT.   No signs of bleeding noted or infusion issues on nursing exam.   Goal of Therapy:  Heparin level 0.3-0.7 units/ml Monitor platelets by anticoagulation protocol: Yes   Plan:  Continue heparin drip at 1150 units/hr. Daily heparin level and CBC. Kyphoplasty tentatively planned for 10/17/20 - f/u holding heparin for procedure.  Per cards  they are not convinced patient will be a good candidate for long-term anticoagulation given history of alcohol abuse, cirrhosis as well as history  of GI bleed.    Norina Buzzard, PharmD PGY1 Pharmacy Resident 10/16/2020 8:09 AM

## 2020-10-16 NOTE — Progress Notes (Addendum)
PROGRESS NOTE    [name]  JOI:[id number] DOB: 19-Jul-1950 DOA: 10/08/2020 PCP: [name], MD   Chief Complaint  Patient presents with  . Back Pain  . Abdominal Pain   Brief Narrative:  70 year old with history of HTN, hypothyroidism, GERD, liver cirrhosis presents with abdominal and low back pain.  Upon admission patient had hyponatremia, hypokalemia, hypomagnesemia CT abdomen pelvis showed cirrhosis with portal hypertension, right pleural effusion with interval development of T10, T8 T12 and L1 compression fracture.  MRI confirmed these findings.  IR consulted who recommended kyphoplasty. Plan pending improvement in afib with RVR.   Assessment & Plan:   Principal Problem:   Back pain Active Problems:   HYPERTENSION, BENIGN ESSENTIAL   Hyponatremia   Hypothyroidism   Hypokalemia due to excessive gastrointestinal loss of potassium   Abdominal pain   Hypomagnesemia   Compression fracture of body of thoracic vertebra (HCC)   History of cirrhosis of liver  Atrial Fibrillation with RVR No prior hx, appears new onset Follow TSH (on low side, synthroid reduced), echo - will follow LE [place] (negative), CT PE protocol negative for PE, small right pleural effusion with right lower lobe atelectasis/infiltrate Has elevated BNP as well chadvasc at least 3 dilt gtt, start oral dilt - per cards, continue dilt gtt until kyphoplasty - may need to reeval anticoagulation in setting of hx etoh abuse, cirrhosis, GI bleed Will ask cardiology to see as well - appreciate assistance - PO and dilt gtt, heparin gtt   Acute Metabolic Encephalopathy Suspect this is 2/2 hospital delirium in setting of opiate use Blood gas with normal pCO2 TSH low, synthroid reduced.  Follow b12 (wnl), folate (wnl), ammonia (wnl).  Delirium precautions Improved   Back pain due Thoracic compression fracture -Significant amount of pain.  CT abdomen pelvis shows compression fracture T10, T12 and  L1 -Pain control-oxycodone IR 10 mg and IV morphine as needed.  Bowel regimen. -IR consulted, MRI performed -> acute compression fx involving the T10 vertebral body with up to 60% height loss and 4 mm bony retropulsion, no significant spinal stenosis, acute compression fx involving T9 vertebral body with up to 25% height loss without significant bony retropulsion, late to subacute to chronic severe compression fractures involving the T12 and L1 vertebral bodies with 80-90% height loss and up to 4 mm bony retropulsion, no significant spinal stenosis.  Small syrinx measuring up to 3 mm involving the majority of the thoracic spinal cord, terminating at T8-9.   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 Kathyrn Sheriff  Abdominal pain, improved Liver cirrhosis with history of alcohol use History of cholecystectomy -Continue PPI.  Pain control -LFTs relatively normal, lipase normal -Right upper quadrant ultrasound-showed cirrhotic pathology -Acute hepatitis-negative -Echo- EF 55 to 60%, grade 1 DD, severely elevated pulmonary artery pressure - Outpatient GI follow-up, notified about her GI to arrange for follow-up -Endoscopy 06/2018- showed gastritis and multiple duodenal ulcer secondary to NSAID  Vitamin D deficiency - Supplement started  Daily tobacco use with history of COPD, mild  exacerbation - steroids, bronchodilators, I-S, flutter valve -- discussed smoking cessation and discussed  possibilities of meds for assistance with cessation, she declined  Hyponatremia - Likely 2/2 cirrhosis, slowly downtrending -- follow urine sodium, urine osm, serum osm - looks relatively euvolemic  Hypokalemia, resolved - replace and follow as needed  Hypomagnesemia - replace and follow as needed  Hypothyroidism -Low TSH elevated T4.  Reduced to Synthroid 100 mcg daily  Essential hypertension: UnControlled -Continue lisinopril 40 mg daily.  Increase hydralazine 25 mg 3 times daily.  Add Norvasc 5 mg daily  Hyperlipidemia -Continue Lovaza  Hyperglycemia Follow a1c 4.7  DVT prophylaxis: SCD Code Status: full  Family Communication: none at bedside - daughter over phone 4/23 - no answer 4/24 Disposition:   Status is: Inpatient  Remains inpatient appropriate because:Inpatient level of care appropriate due to severity of illness   Dispo: The patient is from: Home              Anticipated d/c is to: Home              Patient currently is not medically stable to d/c.   Difficult to place patient No       Consultants:   IR  cardiology  Procedures none  Antimicrobials: Anti-infectives (From admission, onward)   Start     Dose/Rate Route Frequency Ordered Stop   10/17/20 0800  ceFAZolin (ANCEF) IVPB 2g/100 mL premix        2 g 200 mL/hr over 30 Minutes Intravenous To Radiology 10/14/20 1335 10/18/20 0800   10/11/20 0600  ceFAZolin (ANCEF) IVPB 2g/100 mL premix        2 g 200 mL/hr over 30 Minutes Intravenous To Radiology 10/10/20 1448 10/12/20 0600      Subjective: Continued back pain, especially after bath today  Objective: Vitals:   10/15/20 1927 10/16/20 0309 10/16/20 0311 10/16/20 1232  BP: 128/70  (!) 154/85 (!) 142/96  Pulse: 90  84 (!) 102  Resp: 18  18 15   Temp: 98.3 F (36.8 C)  98 F (36.7 C) 98 F (36.7 C)  TempSrc: Oral  Oral Oral  SpO2: 95%  94% 92%  Weight:  82.9 kg    Height:        Intake/Output Summary (Last 24 hours) at  10/16/2020 1555 Last data filed at 10/16/2020 0900 Gross per 24 hour  Intake 540 ml  Output 100 ml  Net 440 ml   Filed Weights   10/14/20 0439 10/15/20 0334 10/16/20 0309  Weight: 80.7 kg 80.9 kg 82.9 kg    Examination:  General: No acute distress. Cardiovascular: irregularly irregular Lungs: Clear to auscultation bilaterally  Abdomen: Soft, nontender, nondistended  Neurological: Alert and oriented 3. Moves all extremities 4. Cranial nerves II through XII grossly intact. Skin: Warm and dry. No rashes or lesions. Extremities: No clubbing or cyanosis. No edema.   Data Reviewed: I have personally reviewed following labs and imaging studies  CBC: Recent Labs  Lab 10/11/20 0403 10/11/20 2302 10/12/20 0802 10/13/20 0153 10/14/20 0336 10/15/20 0324 10/16/20 0213 10/16/20 0908  WBC 10.5   < > 8.8 8.9 7.2 8.4 9.8 10.5  NEUTROABS 8.2*  --  8.2*  --  6.0  --  8.3*  --   HGB 11.5*   < > 12.3 11.9* 11.6* 11.7* 11.2* 12.1  HCT 31.2*   < > 33.4* 32.2* 31.5* 31.5* RESULTS UNAVAILABLE DUE TO INTERFERING SUBSTANCE RESULTS UNAVAILABLE DUE TO INTERFERING SUBSTANCE  MCV 90.4   < >  89.5 90.7 90.0 90.0 RESULTS UNAVAILABLE DUE TO INTERFERING SUBSTANCE RESULTS UNAVAILABLE DUE TO INTERFERING SUBSTANCE  PLT 195   < > 234 192 203 190 125* 182   < > = values in this interval not displayed.    Basic Metabolic Panel: Recent Labs  Lab 10/11/20 0403 10/11/20 2302 10/12/20 0802 10/13/20 0153 10/14/20 0336 10/15/20 0852 10/16/20 0213  NA 127* 129* 131* 132* 130* 129* 128*  K 3.9 4.0 4.0 4.4 4.1 4.0 4.2  CL 90* 86* 88* 88* 87* 88* 86*  CO2 32 34* 35* 31 37* 36* 35*  GLUCOSE 97 135* 140* 105* 99 93 92  BUN 10 11 11 16 20 15 15   CREATININE 0.48 0.49 0.52 0.62 0.63 0.62 0.61  CALCIUM 8.9 9.1 9.4 9.5 9.1 9.0 9.1  MG 1.6* 1.4*  --  1.6* 1.9  --  1.8  PHOS  --   --   --   --  3.3  --  3.6    GFR: Estimated Creatinine Clearance: 67.2 mL/min (by C-G formula based on SCr of 0.61  mg/dL).  Liver Function Tests: Recent Labs  Lab 10/11/20 2302 10/12/20 0802 10/13/20 0153 10/14/20 0336 10/16/20 0213  AST 21 22 24 22 26   ALT 14 14 14 17 25   ALKPHOS 97 98 77 75 80  BILITOT 0.6 1.0 0.9 1.0 0.8  PROT 6.0* 6.1* 5.7* 5.5* 5.8*  ALBUMIN 3.3* 3.4* 3.2* 3.1* 3.2*    CBG: Recent Labs  Lab 10/13/20 0610 10/13/20 1118 10/13/20 1602 10/14/20 0607 10/14/20 1147  GLUCAP 100* 130* 150* 105* 123*     Recent Results (from the past 240 hour(s))  Resp Panel by RT-PCR (Flu Celine Dishman&B, Covid) Nasopharyngeal Swab     Status: None   Collection Time: 10/08/20  5:33 PM   Specimen: Nasopharyngeal Swab; Nasopharyngeal(NP) swabs in vial transport medium  Result Value Ref Range Status   SARS Coronavirus 2 by RT PCR NEGATIVE NEGATIVE Final    Comment: (NOTE) SARS-CoV-2 target nucleic acids are NOT DETECTED.  The SARS-CoV-2 RNA is generally detectable in upper respiratory specimens during the acute phase of infection. The lowest concentration of SARS-CoV-2 viral copies this assay can detect is 138 copies/mL. Manila Rommel negative result does not preclude SARS-Cov-2 infection and should not be used as the sole basis for treatment or other patient management decisions. Merlean Pizzini negative result may occur with  improper specimen collection/handling, submission of specimen other than nasopharyngeal swab, presence of viral mutation(s) within the areas targeted by this assay, and inadequate number of viral copies(<138 copies/mL). Cordarius Benning negative result must be combined with clinical observations, patient history, and epidemiological information. The expected result is Negative.  Fact Sheet for Patients:  EntrepreneurPulse.com.au  Fact Sheet for Healthcare Providers:  IncredibleEmployment.be  This test is no t yet approved or cleared by the [place] FDA and  has been authorized for detection and/or diagnosis of SARS-CoV-2 by FDA under an Emergency Use Authorization  (EUA). This EUA will remain  in effect (meaning this test can be used) for the duration of the COVID-19 declaration under Section 564(b)(1) of the Act, 21 U.S.C.section 360bbb-3(b)(1), unless the authorization is terminated  or revoked sooner.       Influenza Urijah Raynor by PCR NEGATIVE NEGATIVE Final   Influenza B by PCR NEGATIVE NEGATIVE Final    Comment: (NOTE) The Xpert Xpress SARS-CoV-2/FLU/RSV plus assay is intended as an aid in the diagnosis of influenza from Nasopharyngeal swab specimens and should not be used as Caroleann Casler sole basis  for treatment. Nasal washings and aspirates are unacceptable for Xpert Xpress SARS-CoV-2/FLU/RSV testing.  Fact Sheet for Patients: EntrepreneurPulse.com.au  Fact Sheet for Healthcare Providers: IncredibleEmployment.be  This test is not yet approved or cleared by the [place] FDA and has been authorized for detection and/or diagnosis of SARS-CoV-2 by FDA under an Emergency Use Authorization (EUA). This EUA will remain in effect (meaning this test can be used) for the duration of the COVID-19 declaration under Section 564(b)(1) of the Act, 21 U.S.C. section 360bbb-3(b)(1), unless the authorization is terminated or revoked.  Performed at [facility], [place] [address] [place],  [address]          Radiology Studies: No results found.      Scheduled Meds: . cholecalciferol  1,000 Units Oral Daily  . citalopram  20 mg Oral Daily  . diclofenac Sodium  2 g Topical QID  . diltiazem  30 mg Oral Q6H  . docusate sodium  100 mg Oral Daily  . feeding supplement  237 mL Oral BID BM  . hydrALAZINE  50 mg Oral TID  . levothyroxine  100 mcg Oral Q0600  . lisinopril  40 mg Oral Daily  . omega-3 acid ethyl esters  1 g Oral Daily  . pantoprazole  40 mg Oral QHS   Continuous Infusions: . [START ON 10/17/2020]  ceFAZolin (ANCEF) IV    . diltiazem (CARDIZEM) infusion 5 mg/hr (10/15/20 1603)  . heparin 1,150  Units/hr (10/16/20 1357)     LOS: 8 days    Time spent: over 30 min    Fayrene Helper, MD [facility]   To contact the attending provider between 7A-7P or the covering provider during after hours 7P-7A, please log into the web site [email or web address] and access using universal [place] password for that web site. If you do not have the password, please call the hospital operator.  10/16/2020, 3:55 PM

## 2020-10-16 NOTE — Social Work (Signed)
HCPOA has been completed and notarized and copy giving to Nursing secretary to be scanned into chart.

## 2020-10-17 ENCOUNTER — Inpatient Hospital Stay (HOSPITAL_COMMUNITY): Payer: Medicare Other

## 2020-10-17 HISTORY — PX: IR VERTEBROPLASTY CERV/THOR BX INC UNI/BIL INC/INJECT/IMAGING: IMG5515

## 2020-10-17 HISTORY — PX: IR VERTEBROPLASTY EA ADDL (T&LS) BX INC UNI/BIL INC INJECT/IMAGING: IMG5517

## 2020-10-17 LAB — PHOSPHORUS: Phosphorus: 3.9 mg/dL (ref 2.5–4.6)

## 2020-10-17 LAB — COMPREHENSIVE METABOLIC PANEL
ALT: 25 U/L (ref 0–44)
AST: 23 U/L (ref 15–41)
Albumin: 3 g/dL — ABNORMAL LOW (ref 3.5–5.0)
Alkaline Phosphatase: 76 U/L (ref 38–126)
Anion gap: 7 (ref 5–15)
BUN: 7 mg/dL — ABNORMAL LOW (ref 8–23)
CO2: 34 mmol/L — ABNORMAL HIGH (ref 22–32)
Calcium: 8.7 mg/dL — ABNORMAL LOW (ref 8.9–10.3)
Chloride: 87 mmol/L — ABNORMAL LOW (ref 98–111)
Creatinine, Ser: 0.55 mg/dL (ref 0.44–1.00)
GFR, Estimated: >60 mL/min (ref >60)
Glucose, Bld: 79 mg/dL (ref 70–99)
Potassium: 3.8 mmol/L (ref 3.5–5.1)
Sodium: 128 mmol/L — ABNORMAL LOW (ref 135–145)
Total Bilirubin: 0.8 mg/dL (ref 0.3–1.2)
Total Protein: 5.2 g/dL — ABNORMAL LOW (ref 6.5–8.1)

## 2020-10-17 LAB — CBC WITH DIFFERENTIAL/PLATELET
Abs Immature Granulocytes: 0.05 10*3/uL (ref 0.00–0.07)
Basophils Absolute: 0 10*3/uL (ref 0.0–0.1)
Basophils Relative: 0 %
Eosinophils Absolute: 0.1 10*3/uL (ref 0.0–0.5)
Eosinophils Relative: 2 %
Hemoglobin: 11.8 g/dL — ABNORMAL LOW (ref 12.0–15.0)
Immature Granulocytes: 1 %
Lymphocytes Relative: 12 %
Lymphs Abs: 1 10*3/uL (ref 0.7–4.0)
Monocytes Absolute: 0.6 10*3/uL (ref 0.1–1.0)
Monocytes Relative: 8 %
Neutro Abs: 6.2 10*3/uL (ref 1.7–7.7)
Neutrophils Relative %: 77 %
Platelets: 153 10*3/uL (ref 150–400)
WBC: 8 10*3/uL (ref 4.0–10.5)

## 2020-10-17 LAB — PROTIME-INR
INR: 1 (ref 0.8–1.2)
Prothrombin Time: 13.3 seconds (ref 11.4–15.2)

## 2020-10-17 LAB — HEPARIN LEVEL (UNFRACTIONATED): Heparin Unfractionated: 0.41 IU/mL (ref 0.30–0.70)

## 2020-10-17 LAB — MAGNESIUM: Magnesium: 1.6 mg/dL — ABNORMAL LOW (ref 1.7–2.4)

## 2020-10-17 MED ORDER — TOBRAMYCIN SULFATE 1.2 G IJ SOLR
INTRAMUSCULAR | Status: AC
  Administered 2020-10-17: 0.1 mg
  Filled 2020-10-17: qty 1.2

## 2020-10-17 MED ORDER — DILTIAZEM HCL ER COATED BEADS 120 MG PO CP24: 120.0000 mg | ORAL_CAPSULE | Freq: Every day | ORAL | Status: DC

## 2020-10-17 MED ORDER — FENTANYL CITRATE (PF) 100 MCG/2ML IJ SOLN
INTRAMUSCULAR | Status: AC | PRN
  Administered 2020-10-17: 25 ug via INTRAVENOUS
  Administered 2020-10-17: 12.5 ug via INTRAVENOUS

## 2020-10-17 MED ORDER — SODIUM CHLORIDE 0.9 % IV SOLN: INTRAVENOUS | Status: AC

## 2020-10-17 MED ORDER — GELATIN ABSORBABLE 12-7 MM EX MISC
CUTANEOUS | Status: AC
  Administered 2020-10-17: 1
  Filled 2020-10-17: qty 1

## 2020-10-17 MED ORDER — MAGNESIUM SULFATE 4 GM/100ML IV SOLN
4.0000 g | Freq: Once | INTRAVENOUS | Status: AC
  Administered 2020-10-17: 4 g via INTRAVENOUS
  Filled 2020-10-17: qty 100

## 2020-10-17 MED ORDER — FENTANYL CITRATE (PF) 100 MCG/2ML IJ SOLN
INTRAMUSCULAR | Status: AC
  Filled 2020-10-17: qty 2

## 2020-10-17 MED ORDER — MAGNESIUM OXIDE 400 (241.3 MG) MG PO TABS
400.0000 mg | ORAL_TABLET | Freq: Every day | ORAL | Status: DC
  Administered 2020-10-17 – 2020-10-18 (×2): 400 mg via ORAL
  Filled 2020-10-17 (×2): qty 1

## 2020-10-17 MED ORDER — IOHEXOL 300 MG/ML  SOLN
50.0000 mL | Freq: Once | INTRAMUSCULAR | Status: AC | PRN
  Administered 2020-10-17: 10 mL

## 2020-10-17 MED ORDER — SODIUM CHLORIDE 0.9 % IV SOLN: INTRAVENOUS | Status: DC

## 2020-10-17 MED ORDER — CEFAZOLIN SODIUM-DEXTROSE 2-4 GM/100ML-% IV SOLN
INTRAVENOUS | Status: AC
  Filled 2020-10-17: qty 100

## 2020-10-17 MED ORDER — MIDAZOLAM HCL 2 MG/2ML IJ SOLN
INTRAMUSCULAR | Status: AC
  Filled 2020-10-17: qty 2

## 2020-10-17 MED ORDER — DILTIAZEM HCL ER COATED BEADS 120 MG PO CP24
120.0000 mg | ORAL_CAPSULE | Freq: Every day | ORAL | Status: DC
  Administered 2020-10-17 – 2020-10-18 (×2): 120 mg via ORAL
  Filled 2020-10-17 (×2): qty 1

## 2020-10-17 MED ORDER — MIDAZOLAM HCL 2 MG/2ML IJ SOLN
INTRAMUSCULAR | Status: AC | PRN
  Administered 2020-10-17: 1 mg via INTRAVENOUS

## 2020-10-17 MED ORDER — BUPIVACAINE HCL (PF) 0.5 % IJ SOLN
INTRAMUSCULAR | Status: AC
  Administered 2020-10-17: 20 mL
  Filled 2020-10-17: qty 30

## 2020-10-17 NOTE — Progress Notes (Addendum)
PROGRESS NOTE    [name]  [name]:[id number] DOB: [birth year] DOA: 10/08/2020 PCP: [name], MD   Chief Complaint  Patient presents with  . Back Pain  . Abdominal Pain   Brief Narrative:  70 year old with history of HTN, hypothyroidism, GERD, liver cirrhosis presents with abdominal and low back pain.  Upon admission patient had hyponatremia, hypokalemia, hypomagnesemia CT abdomen pelvis showed cirrhosis with portal hypertension, right pleural effusion with interval development of T10, T8 T12 and L1 compression fracture.  MRI confirmed these findings.  IR consulted who recommended kyphoplasty. Plan pending improvement in afib with RVR.   Assessment & Plan:   Principal Problem:   Back pain Active Problems:   HYPERTENSION, BENIGN ESSENTIAL   Hyponatremia   Hypothyroidism   Hypokalemia due to excessive gastrointestinal loss of potassium   Abdominal pain   Hypomagnesemia   Compression fracture of body of thoracic vertebra (HCC)   History of cirrhosis of liver  Atrial Fibrillation with RVR No prior hx, appears new onset Follow TSH (on low side, synthroid reduced), echo - will follow LE [place] (negative), CT PE protocol negative for PE, small right pleural effusion with right lower lobe atelectasis/infiltrate Has elevated BNP as well chadvasc at least 3 dilt gtt, start oral dilt - per cards, recommending transition to PO long acting dilt post procedure and d/c short acting dilt and IV dilt -> not thought to be great long term oral anticoagulation candidate - holding heparin preprocedure  Acute Metabolic Encephalopathy Suspect this is 2/2 hospital delirium in setting of opiate use Blood gas with normal pCO2 TSH low, synthroid reduced.  Follow b12 (wnl), folate (wnl), ammonia (wnl).  Delirium precautions Improved   Back pain due Thoracic compression fracture -Significant amount of pain.  CT abdomen pelvis shows compression fracture T10, T12 and L1 -Pain  control-oxycodone IR 10 mg and IV morphine as needed.  Bowel regimen. -IR consulted, MRI performed -> acute compression fx involving the T10 vertebral body with up to 60% height loss and 4 mm bony retropulsion, no significant spinal stenosis, acute compression fx involving T9 vertebral body with up to 25% height loss without significant bony retropulsion, late to subacute to chronic severe compression fractures involving the T12 and L1 vertebral bodies with 80-90% height loss and up to 4 mm bony retropulsion, no significant spinal stenosis.  Small syrinx measuring up to 3 mm involving the majority of the thoracic spinal cord, terminating at T8-9.   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 Kathyrn Sheriff  Abdominal pain, improved Liver cirrhosis with history of alcohol use History of cholecystectomy -Continue PPI.  Pain control -LFTs relatively normal, lipase normal -Right upper quadrant ultrasound-showed cirrhotic pathology -Acute hepatitis-negative -Echo- EF 55 to 60%, grade 1 DD, severely elevated pulmonary artery pressure - Outpatient GI follow-up, notified about her GI to arrange for follow-up -Endoscopy 06/2018- showed gastritis and multiple duodenal ulcer secondary to NSAID  Vitamin D deficiency - Supplement started  Daily tobacco use with history of COPD,  mild exacerbation - steroids, bronchodilators, I-S, flutter valve --  discussed smoking cessation and discussed possibilities of meds for assistance with cessation, she declined  Hyponatremia - Likely 2/2 cirrhosis, slowly downtrending - low urine sodium, can try gentle IVF after procedure - follow   -- follow urine sodium, urine osm, serum osm - looks relatively euvolemic  Hypokalemia, resolved - replace and follow as needed  Hypomagnesemia - replace and follow as needed  Hypothyroidism -Low TSH elevated T4.  Reduced to Synthroid 100 mcg daily  Essential hypertension: UnControlled -Continue lisinopril 40 mg daily.  Increase hydralazine 25 mg 3 times daily.  Add Norvasc 5 mg daily  Hyperlipidemia -Continue Lovaza  Hyperglycemia Follow a1c 4.7  DVT prophylaxis: SCD Code Status: full --- addendum - family brought paperwork which noted pt DNR, confirmed this with patient and daughter [name]) -> changed to DNR Family Communication: none at bedside - both daughters at bedside  Disposition:   Status is: Inpatient  Remains inpatient appropriate because:Inpatient level of care appropriate due to severity of illness   Dispo: The patient is from: Home              Anticipated d/c is to: Home              Patient currently is not medically stable to d/c.   Difficult to place patient No       Consultants:   IR  cardiology  Procedures none  Antimicrobials: Anti-infectives (From admission, onward)   Start     Dose/Rate Route Frequency Ordered Stop   10/17/20 1409  ceFAZolin (ANCEF) 2-4 GM/100ML-% IVPB       Note to Pharmacy: Domenick Bookbinder   : cabinet override      10/17/20 1409 10/17/20 1417   10/17/20 1359  tobramycin (NEBCIN) 1.2 g powder       Note to Pharmacy: Mervyn Skeeters   : cabinet override      10/17/20 1359 10/18/20 0214   10/17/20 0800  ceFAZolin (ANCEF) IVPB 2g/100 mL premix        2 g 200 mL/hr over 30 Minutes Intravenous To Radiology 10/14/20 1335 10/17/20 1441   10/11/20 0600  ceFAZolin (ANCEF) IVPB 2g/100 mL  premix        2 g 200 mL/hr over 30 Minutes Intravenous To Radiology 10/10/20 1448 10/12/20 0600      Subjective: No new complaints Notes continued back pain after working with OT  Objective: Vitals:   10/17/20 1410 10/17/20 1430 10/17/20 1435 10/17/20 1440  BP: (!) 162/88 (!) 135/91 135/88 (!) 168/75  Pulse: 81 64 64 64  Resp: 17 16 15 15   Temp:      TempSrc:      SpO2: 92% 100% 100% 100%  Weight:      Height:        Intake/Output Summary (Last 24 hours) at 10/17/2020 1444 Last data filed at 10/17/2020 1127 Gross per 24 hour  Intake 1153.19 ml  Output 1075 ml  Net 78.19 ml   Filed Weights   10/15/20 0334 10/16/20 0309 10/17/20 0348  Weight: 80.9 kg 82.9 kg 82.4 kg    Examination:  General: No acute distress. Cardiovascular: Heart sounds show Mikahla Wisor regular rate, and rhythm. Lungs: Clear to auscultation bilaterally  Abdomen: Soft, nontender, nondistended  Neurological: Alert and oriented 3. Moves all extremities 4 . Cranial nerves II through XII grossly intact. Skin: Warm and dry. No rashes or lesions. Extremities: No clubbing or cyanosis. No edema.  Data Reviewed: I  have personally reviewed following labs and imaging studies  CBC: Recent Labs  Lab 10/11/20 0403 10/11/20 2302 10/12/20 0802 10/13/20 0153 10/14/20 0336 10/15/20 0324 10/16/20 0213 10/16/20 0908 10/17/20 0642  WBC 10.5   < > 8.8   < > 7.2 8.4 9.8 10.5 8.0  NEUTROABS 8.2*  --  8.2*  --  6.0  --  8.3*  --  6.2  HGB 11.5*   < > 12.3   < > 11.6* 11.7* 11.2* 12.1 11.8*  HCT 31.2*   < > 33.4*   < > 31.5* 31.5* RESULTS UNAVAILABLE DUE TO INTERFERING SUBSTANCE RESULTS UNAVAILABLE DUE TO INTERFERING SUBSTANCE RESULTS UNAVAILABLE DUE TO INTERFERING SUBSTANCE  MCV 90.4   < > 89.5   < > 90.0 90.0 RESULTS UNAVAILABLE DUE TO INTERFERING SUBSTANCE RESULTS UNAVAILABLE DUE TO INTERFERING SUBSTANCE RESULTS UNAVAILABLE DUE TO INTERFERING SUBSTANCE  PLT 195   < > 234   < > 203 190 125* 182 153   < > = values in  this interval not displayed.    Basic Metabolic Panel: Recent Labs  Lab 10/11/20 2302 10/12/20 0802 10/13/20 0153 10/14/20 0336 10/15/20 0852 10/16/20 0213 10/17/20 0642  NA 129*   < > 132* 130* 129* 128* 128*  K 4.0   < > 4.4 4.1 4.0 4.2 3.8  CL 86*   < > 88* 87* 88* 86* 87*  CO2 34*   < > 31 37* 36* 35* 34*  GLUCOSE 135*   < > 105* 99 93 92 79  BUN 11   < > 16 20 15 15  7*  CREATININE 0.49   < > 0.62 0.63 0.62 0.61 0.55  CALCIUM 9.1   < > 9.5 9.1 9.0 9.1 8.7*  MG 1.4*  --  1.6* 1.9  --  1.8 1.6*  PHOS  --   --   --  3.3  --  3.6 3.9   < > = values in this interval not displayed.    GFR: Estimated Creatinine Clearance: 67 mL/min (by C-G formula based on SCr of 0.55 mg/dL).  Liver Function Tests: Recent Labs  Lab 10/12/20 0802 10/13/20 0153 10/14/20 0336 10/16/20 0213 10/17/20 0642  AST 22 24 22 26 23   ALT 14 14 17 25 25   ALKPHOS 98 77 75 80 76  BILITOT 1.0 0.9 1.0 0.8 0.8  PROT 6.1* 5.7* 5.5* 5.8* 5.2*  ALBUMIN 3.4* 3.2* 3.1* 3.2* 3.0*    CBG: Recent Labs  Lab 10/13/20 0610 10/13/20 1118 10/13/20 1602 10/14/20 0607 10/14/20 1147  GLUCAP 100* 130* 150* 105* 123*     Recent Results (from the past 240 hour(s))  Resp Panel by RT-PCR (Flu Kindle Strohmeier&B, Covid) Nasopharyngeal Swab     Status: None   Collection Time: 10/08/20  5:33 PM   Specimen: Nasopharyngeal Swab; Nasopharyngeal(NP) swabs in vial transport medium  Result Value Ref Range Status   SARS Coronavirus 2 by RT PCR NEGATIVE NEGATIVE Final    Comment: (NOTE) SARS-CoV-2 target nucleic acids are NOT DETECTED.  The SARS-CoV-2 RNA is generally detectable in upper respiratory specimens during the acute phase of infection. The lowest concentration of SARS-CoV-2 viral copies this assay can detect is 138 copies/mL. Teauna Dubach negative result does not preclude SARS-Cov-2 infection and should not be used as the sole basis for treatment or other patient management decisions. Charmika Macdonnell negative result may occur with  improper  specimen collection/handling, submission of specimen other than nasopharyngeal swab, presence of viral mutation(s) within the areas targeted by this  assay, and inadequate number of viral copies(<138 copies/mL). Soua Caltagirone negative result must be combined with clinical observations, patient history, and epidemiological information. The expected result is Negative.  Fact Sheet for Patients:  EntrepreneurPulse.com.au  Fact Sheet for Healthcare Providers:  IncredibleEmployment.be  This test is no t yet approved or cleared by the [place] FDA and  has been authorized for detection and/or diagnosis of SARS-CoV-2 by FDA under an Emergency Use Authorization (EUA). This EUA will remain  in effect (meaning this test can be used) for the duration of the COVID-19 declaration under Section 564(b)(1) of the Act, 21 U.S.C.section 360bbb-3(b)(1), unless the authorization is terminated  or revoked sooner.       Influenza Tobie Perdue by PCR NEGATIVE NEGATIVE Final   Influenza B by PCR NEGATIVE NEGATIVE Final    Comment: (NOTE) The Xpert Xpress SARS-CoV-2/FLU/RSV plus assay is intended as an aid in the diagnosis of influenza from Nasopharyngeal swab specimens and should not be used as Braydin Aloi sole basis for treatment. Nasal washings and aspirates are unacceptable for Xpert Xpress SARS-CoV-2/FLU/RSV testing.  Fact Sheet for Patients: EntrepreneurPulse.com.au  Fact Sheet for Healthcare Providers: IncredibleEmployment.be  This test is not yet approved or cleared by the [place] FDA and has been authorized for detection and/or diagnosis of SARS-CoV-2 by FDA under an Emergency Use Authorization (EUA). This EUA will remain in effect (meaning this test can be used) for the duration of the COVID-19 declaration under Section 564(b)(1) of the Act, 21 U.S.C. section 360bbb-3(b)(1), unless the authorization is terminated or revoked.  Performed at  [facility], [place] [address], [place], [place] [address]          Radiology Studies: No results found.      Scheduled Meds: . bupivacaine      . cholecalciferol  1,000 Units Oral Daily  . citalopram  20 mg Oral Daily  . diclofenac Sodium  2 g Topical QID  . [START ON 10/18/2020] diltiazem  120 mg Oral Daily  . docusate sodium  100 mg Oral Daily  . feeding supplement  237 mL Oral BID BM  . hydrALAZINE  50 mg Oral TID  . levothyroxine  100 mcg Oral Q0600  . lisinopril  40 mg Oral Daily  . magnesium oxide  400 mg Oral Daily  . omega-3 acid ethyl esters  1 g Oral Daily  . pantoprazole  40 mg Oral QHS  . tobramycin       Continuous Infusions: . diltiazem (CARDIZEM) infusion 5 mg/hr (10/17/20 0900)     LOS: 9 days    Time spent: over 30 min    Fayrene Helper, MD [facility]   To contact the attending provider between 7A-7P or the covering provider during after hours 7P-7A, please log into the web site [email or web address] and access using universal [place] password for that web site. If you do not have the password, please call the hospital operator.  10/17/2020, 2:44 PM

## 2020-10-17 NOTE — Progress Notes (Signed)
Occupational Therapy Treatment Patient Details Name: [name] MRN: [id number] DOB: 1951-01-08 Today's Date: 10/17/2020    History of present illness Patient is a 70 y/o female who presents on 10/09/20 with low back/abdominal pain and new urinary/bowel incontinence. Found to have hyponatremia, hypokalemia and hypomagnesemia. CT abdomen/pelvis showed cirrhosis with portal HTN, right pleural effusion with interval development of T10, T8 T12 and L1 compression fractures. Plan for kyphoplasty when cardiac status improves.  PMH includes liver cirrhosis secondary to alcohol abuse, HTN.   OT comments  This 70 yo female seen today to focus on bed mobility and transfers for basic ADLs. Pt doing really well with S-min guard A for all mobility; pain is what is limiting her the most. She will continue to benefit from acute OT with follow up [place].  Follow Up Recommendations  Home health OT;Supervision - Intermittent    Equipment Recommendations  3 in 1 bedside commode       Precautions / Restrictions Precautions Precautions: Back Precaution Comments: reviewed back precautions, BP stable today Restrictions Weight Bearing Restrictions: No       Mobility Bed Mobility Overal bed mobility: Needs Assistance Bed Mobility: Rolling;Sidelying to Sit;Sit to Sidelying Rolling: Supervision Sidelying to sit: Min guard     Sit to sidelying: Min guard      Transfers Overall transfer level: Needs assistance Equipment used: Rolling walker (2 wheeled) Transfers: Sit to/from Stand Sit to Stand: Min guard         General transfer comment: min guard A to side step up towards [facility]    Balance Overall balance assessment: History of Falls;Needs assistance Sitting-balance support: No upper extremity supported;Feet supported Sitting balance-Leahy Scale: Fair     Standing balance support: Bilateral upper extremity supported Standing balance-Leahy Scale: Poor Standing balance comment: reliant on  RW                           ADL either performed or assessed with clinical judgement   ADL Overall ADL's : Needs assistance/impaired                         Toilet Transfer: Min Fish farm manager Details (indicate cue type and reason): sit>stand from bed; 5 side steps up to [facility]; sit back on bed                 Vision Baseline Vision/History: Wears glasses Wears Glasses: At all times            Cognition Arousal/Alertness: Awake/alert Behavior During Therapy: WFL for tasks assessed/performed Overall Cognitive Status: Within Functional Limits for tasks assessed                                                     Pertinent Vitals/ Pain       Pain Assessment: 0-10 Pain Score: 4  Pain Location: back Pain Descriptors / Indicators: Guarding Pain Intervention(s): Limited activity within patient's tolerance;Monitored during session         Frequency  Min 2X/week        Progress Toward Goals  OT Goals(current goals can now be found in the care plan section)  Progress towards OT goals: Progressing toward goals  Acute Rehab OT Goals Patient Stated Goal: hopeful for kyphoplasty today OT Goal  Formulation: With patient Time For Goal Achievement: 10/24/20 Potential to Achieve Goals: Good  Plan Discharge plan remains appropriate       AM-PAC OT "6 Clicks" Daily Activity     Outcome Measure   Help from another person eating meals?: None Help from another person taking care of personal grooming?: A Little Help from another person toileting, which includes using toliet, bedpan, or urinal?: A Little Help from another person bathing (including washing, rinsing, drying)?: A Lot Help from another person to put on and taking off regular upper body clothing?: A Little Help from another person to put on and taking off regular lower body clothing?: A Lot 6 Click Score: 17    End of Session Equipment Utilized During  Treatment: Rolling walker  OT Visit Diagnosis: Other abnormalities of gait and mobility (R26.89);Pain Pain - part of body:  (back)   Activity Tolerance Patient tolerated treatment well   Patient Left in bed;with call bell/phone within reach;with bed alarm set;with family/visitor present   Nurse Communication          Time: 7106-2694 OT Time Calculation (min): 16 min  Charges: OT General Charges $OT Visit: 1 Visit OT Treatments $Self Care/Home Management : 8-22 mins  [address], OTR/L Acute [employer] Pager [phone] Office [phone]      Almon Register 10/17/2020, 2:00 PM

## 2020-10-17 NOTE — Plan of Care (Signed)
  Problem: Education: Goal: Knowledge of General Education information will improve Description: Including pain rating scale, medication(s)/side effects and non-pharmacologic comfort measures Outcome: Progressing   Problem: Health Behavior/Discharge Planning: Goal: Ability to manage health-related needs will improve Outcome: Progressing   Problem: Clinical Measurements: Goal: Ability to maintain clinical measurements within normal limits will improve Outcome: Progressing Goal: Will remain free from infection Outcome: Progressing Goal: Diagnostic test results will improve Outcome: Not Progressing Goal: Respiratory complications will improve Outcome: Progressing Goal: Cardiovascular complication will be avoided Outcome: Progressing

## 2020-10-17 NOTE — Progress Notes (Signed)
ANTICOAGULATION CONSULT NOTE - Follow Up Consult  Pharmacy Consult for Heparin Indication: atrial fibrillation  No Active Allergies  Patient Measurements: Height: 5\' 4"  (162.6 cm) Weight: 82.4 kg (181 lb 10.5 oz) IBW/kg (Calculated) : 54.7 Heparin Dosing Weight: 72 kg  Vital Signs: Temp: 98.3 F (36.8 C) (04/25 0348) Temp Source: Oral (04/25 0348) BP: 160/71 (04/25 0348) Pulse Rate: 58 (04/25 0348)  Labs: Recent Labs    10/15/20 0324 10/15/20 1610 10/16/20 0213 10/16/20 0250 10/16/20 0908 10/17/20 0642  HGB 11.7*  --  11.2*  --  12.1  --   HCT 31.5*  --  RESULTS UNAVAILABLE DUE TO INTERFERING SUBSTANCE  --  RESULTS UNAVAILABLE DUE TO INTERFERING SUBSTANCE  --   PLT 190  --  125*  --  182  --   LABPROT  --   --   --   --   --  13.3  INR  --   --   --   --   --  1.0  HEPARINUNFRC 0.47  --   --  0.57  --  0.41  CREATININE  --  0.62 0.61  --   --  0.55    Estimated Creatinine Clearance: 67 mL/min (by C-G formula based on SCr of 0.55 mg/dL).  Assessment: 70 yr old female admitted with new afib with RVR and lumbar compression fx. Plan for kyphoplasty; pharmacy was consulted to dose IV heparin for atrial fibrillation. Of note, pt with cirrhosis and portal hypertension. Pt was on no anticoagulation PTA. LFTs normal.  Per cards, patient is not a good candidate for long-term anticoagulation given history of alcohol abuse, cirrhosis as well as history of GI bleed.    Heparin level 0.41 is therapeutic on 1150 units/hr. Hgb and platelet stable.   Goal of Therapy:  Heparin level 0.3-0.7 units/ml Monitor platelets by anticoagulation protocol: Yes   Plan:  Continue heparin drip at 1150 units/hr. Daily heparin level and CBC. Kyphoplasty tentatively planned for 10/17/20 - IR RN to let bedside RN know when to hold heparin  F/u long term plan   Benetta Spar, PharmD, BCPS, [facility] Clinical Pharmacist  Please check AMION for all [place] phone numbers After 10:00 PM, call [place] [phone]

## 2020-10-17 NOTE — Progress Notes (Signed)
PT Cancellation Note  Patient Details Name: [name] MRN: [id number] DOB: 13-Apr-1951   Cancelled Treatment:    Reason Eval/Treat Not Completed: Patient at procedure or test/unavailable Pt getting vertebroplasty this PM. Will follow up.   Marguarite Arbour A Timberly Yott 10/17/2020, 3:35 PM Marisa Severin, PT, DPT Acute Rehabilitation Services Pager [phone] Office [phone]

## 2020-10-17 NOTE — Discharge Instructions (Signed)
1.No stooping or bending or lifting more than more than 10 lbs for 2 weeks. 2.Use  walker to ambulate for 2 weeks 3.No driving for 2 weeks

## 2020-10-17 NOTE — Procedures (Signed)
S/P T 9 and T 10 VP. S.Aleathea Pugmire MD

## 2020-10-17 NOTE — Progress Notes (Signed)
Progress Note  Patient Name: [name] Date of Encounter: 10/17/2020  [facility] [address] [facility] Cardiologist: Elouise Munroe, MD   Subjective   No CP or dyspnea  Inpatient Medications    Scheduled Meds: . cholecalciferol  1,000 Units Oral Daily  . citalopram  20 mg Oral Daily  . diclofenac Sodium  2 g Topical QID  . diltiazem  30 mg Oral Q6H  . docusate sodium  100 mg Oral Daily  . feeding supplement  237 mL Oral BID BM  . hydrALAZINE  50 mg Oral TID  . levothyroxine  100 mcg Oral Q0600  . lisinopril  40 mg Oral Daily  . magnesium oxide  400 mg Oral Daily  . omega-3 acid ethyl esters  1 g Oral Daily  . pantoprazole  40 mg Oral QHS   Continuous Infusions: .  ceFAZolin (ANCEF) IV    . diltiazem (CARDIZEM) infusion 5 mg/hr (10/16/20 2034)  . heparin 1,150 Units/hr (10/16/20 1357)  . magnesium sulfate bolus IVPB     PRN Meds: acetaminophen, dextromethorphan-guaiFENesin, iohexol, ipratropium-albuterol, labetalol, metoprolol tartrate, oxyCODONE, polyethylene glycol, senna-docusate   Vital Signs    Vitals:   10/16/20 1931 10/16/20 2223 10/16/20 2321 10/17/20 0348  BP: 131/73 (!) 166/115 (!) 158/74 (!) 160/71  Pulse: 61   (!) 58  Resp: 19   17  Temp: 98 F (36.7 C)  98 F (36.7 C) 98.3 F (36.8 C)  TempSrc: Oral  Oral Oral  SpO2: 94%  93% 92%  Weight:    82.4 kg  Height:        Intake/Output Summary (Last 24 hours) at 10/17/2020 0934 Last data filed at 10/17/2020 0825 Gross per 24 hour  Intake 0 ml  Output 675 ml  Net -675 ml   Last 3 Weights 10/17/2020 10/16/2020 10/15/2020  Weight (lbs) 181 lb 10.5 oz 182 lb 12.2 oz 178 lb 5.6 oz  Weight (kg) 82.4 kg 82.9 kg 80.9 kg      Telemetry    Atrial fibrillation rate upper normal - Personally Reviewed   Physical Exam   GEN: No acute distress.   Neck: No JVD Cardiac: irregular Respiratory: Clear to auscultation bilaterally. GI: Soft, nontender, non-distended  MS: No edema Neuro:  Nonfocal  Psych: Normal  affect   Labs     Chemistry Recent Labs  Lab 10/14/20 0336 10/15/20 0852 10/16/20 0213 10/17/20 0642  NA 130* 129* 128* 128*  K 4.1 4.0 4.2 3.8  CL 87* 88* 86* 87*  CO2 37* 36* 35* 34*  GLUCOSE 99 93 92 79  BUN 20 15 15  7*  CREATININE 0.63 0.62 0.61 0.55  CALCIUM 9.1 9.0 9.1 8.7*  PROT 5.5*  --  5.8* 5.2*  ALBUMIN 3.1*  --  3.2* 3.0*  AST 22  --  26 23  ALT 17  --  25 25  ALKPHOS 75  --  80 76  BILITOT 1.0  --  0.8 0.8  GFRNONAA >60 >60 >60 >60  ANIONGAP 6 5 7 7      Hematology Recent Labs  Lab 10/15/20 0324 10/16/20 0213 10/16/20 0908  WBC 8.4 9.8 10.5  RBC 3.50* RESULTS UNAVAILABLE DUE TO INTERFERING SUBSTANCE RESULTS UNAVAILABLE DUE TO INTERFERING SUBSTANCE  HGB 11.7* 11.2* 12.1  HCT 31.5* RESULTS UNAVAILABLE DUE TO INTERFERING SUBSTANCE RESULTS UNAVAILABLE DUE TO INTERFERING SUBSTANCE  MCV 90.0 RESULTS UNAVAILABLE DUE TO INTERFERING SUBSTANCE RESULTS UNAVAILABLE DUE TO INTERFERING SUBSTANCE  MCH 33.4 RESULTS UNAVAILABLE DUE TO INTERFERING SUBSTANCE RESULTS UNAVAILABLE  DUE TO INTERFERING SUBSTANCE  MCHC 37.1* RESULTS UNAVAILABLE DUE TO INTERFERING SUBSTANCE RESULTS UNAVAILABLE DUE TO INTERFERING SUBSTANCE  RDW 13.2 RESULTS UNAVAILABLE DUE TO INTERFERING SUBSTANCE RESULTS UNAVAILABLE DUE TO INTERFERING SUBSTANCE  PLT 190 125* 182    BNP Recent Labs  Lab 10/11/20 2305 10/12/20 0802  BNP 949.4* 902.7*     Radiology    No results found.  Patient Profile     70 y.o. female with a hx of HTN, GERD, hypothyroid, liver cirrhosis, + tobacco use, COPD, admitted 10/08/20 with back and abd pain along with hypokalemia,(K+ 2.5, Mg+ 1.1) hypomg+ hyponatremiaand new diagnosis of atrial fib RVR.  Patient found to have T10, T8, T12 and L1 compression fracture.  Interventional radiology consulted who recommended kyphoplasty.  Echocardiogram this admission shows ejection fraction 55 to 28%, grade 1 diastolic dysfunction, mild right ventricular enlargement, severe pulmonary  hypertension, severe left atrial enlargement, mild right atrial enlargement, mild mitral regurgitation.  TSH 0.241.  Free T4 1.48.  Assessment & Plan    1 persistent atrial fibrillation-is in sinus rhythm today- remains on IV diltiazem. Currently NPO. She has po diltiazem ordered and is also on IV. Would transition to po LA diltiazem when she is eating again post-procedure and d/c short acting diltiazem and IV diltiazem. On IV heparin, not thought to be a good long-term OAC candidate. Would d/c heparin at this point since it needs to be held for kyphoplasty anyhow.  2 vertebral fracture-plan is for kyphoplasty ? Today. Appears ordered.  3 pulmonary hypertension-felt to be multifactorial including COPD, possible obstructive sleep apnea and cirrhosis.  4 hypertension-blood pressure remains elevated.  Continue present medications and follow. Cardizem may help BP.  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  6 cirrhosis -  follow-up GI as an outpatient.  For questions or updates, please contact [place] Please consult [email or web address] for contact info under   Pixie Casino, MD, FACC, [place] Director of the [facility] Diplomate of the American Board of Clinical Lipidology Attending Cardiologist  Direct Dial: [phone]  Fax: [phone]  Website:  [email or web address]  Pixie Casino, MD  10/17/2020, 9:34 AM

## 2020-10-18 LAB — COMPREHENSIVE METABOLIC PANEL
ALT: 21 U/L (ref 0–44)
AST: 20 U/L (ref 15–41)
Albumin: 2.9 g/dL — ABNORMAL LOW (ref 3.5–5.0)
Alkaline Phosphatase: 77 U/L (ref 38–126)
Anion gap: 6 (ref 5–15)
BUN: 5 mg/dL — ABNORMAL LOW (ref 8–23)
CO2: 30 mmol/L (ref 22–32)
Calcium: 8.5 mg/dL — ABNORMAL LOW (ref 8.9–10.3)
Chloride: 92 mmol/L — ABNORMAL LOW (ref 98–111)
Creatinine, Ser: 0.56 mg/dL (ref 0.44–1.00)
GFR, Estimated: >60 mL/min (ref >60)
Glucose, Bld: 76 mg/dL (ref 70–99)
Potassium: 3.6 mmol/L (ref 3.5–5.1)
Sodium: 128 mmol/L — ABNORMAL LOW (ref 135–145)
Total Bilirubin: 1.2 mg/dL (ref 0.3–1.2)
Total Protein: 5.3 g/dL — ABNORMAL LOW (ref 6.5–8.1)

## 2020-10-18 LAB — BASIC METABOLIC PANEL
Anion gap: 9 (ref 5–15)
Anion gap: 11 (ref 5–15)
BUN: 6 mg/dL — ABNORMAL LOW (ref 8–23)
BUN: 5 mg/dL — ABNORMAL LOW (ref 8–23)
CO2: 27 mmol/L (ref 22–32)
CO2: 25 mmol/L (ref 22–32)
Calcium: 8.6 mg/dL — ABNORMAL LOW (ref 8.9–10.3)
Calcium: 8.8 mg/dL — ABNORMAL LOW (ref 8.9–10.3)
Chloride: 93 mmol/L — ABNORMAL LOW (ref 98–111)
Chloride: 91 mmol/L — ABNORMAL LOW (ref 98–111)
Creatinine, Ser: 0.52 mg/dL (ref 0.44–1.00)
Creatinine, Ser: 0.55 mg/dL (ref 0.44–1.00)
GFR, Estimated: >60 mL/min (ref >60)
GFR, Estimated: >60 mL/min (ref >60)
Glucose, Bld: 84 mg/dL (ref 70–99)
Glucose, Bld: 72 mg/dL (ref 70–99)
Potassium: 3.4 mmol/L — ABNORMAL LOW (ref 3.5–5.1)
Potassium: 3.2 mmol/L — ABNORMAL LOW (ref 3.5–5.1)
Sodium: 129 mmol/L — ABNORMAL LOW (ref 135–145)
Sodium: 127 mmol/L — ABNORMAL LOW (ref 135–145)

## 2020-10-18 LAB — CBC WITH DIFFERENTIAL/PLATELET
Abs Immature Granulocytes: 0.03 10*3/uL (ref 0.00–0.07)
Basophils Absolute: 0 10*3/uL (ref 0.0–0.1)
Basophils Relative: 0 %
Eosinophils Absolute: 0.1 10*3/uL (ref 0.0–0.5)
Eosinophils Relative: 2 %
HCT: 32.3 % — ABNORMAL LOW (ref 36.0–46.0)
Hemoglobin: 12.1 g/dL (ref 12.0–15.0)
Immature Granulocytes: 0 %
Lymphocytes Relative: 8 %
Lymphs Abs: 0.7 10*3/uL (ref 0.7–4.0)
MCH: 33.1 pg (ref 26.0–34.0)
MCHC: 37.5 g/dL — ABNORMAL HIGH (ref 30.0–36.0)
MCV: 88.3 fL (ref 80.0–100.0)
Monocytes Absolute: 0.7 10*3/uL (ref 0.1–1.0)
Monocytes Relative: 8 %
Neutro Abs: 7 10*3/uL (ref 1.7–7.7)
Neutrophils Relative %: 82 %
Platelets: 152 10*3/uL (ref 150–400)
RBC: 3.66 MIL/uL — ABNORMAL LOW (ref 3.87–5.11)
RDW: 13 % (ref 11.5–15.5)
WBC: 8.5 10*3/uL (ref 4.0–10.5)
nRBC: 0 % (ref 0.0–0.2)

## 2020-10-18 LAB — PHOSPHORUS: Phosphorus: 4 mg/dL (ref 2.5–4.6)

## 2020-10-18 LAB — MAGNESIUM: Magnesium: 2.2 mg/dL (ref 1.7–2.4)

## 2020-10-18 MED ORDER — DILTIAZEM HCL ER COATED BEADS 120 MG PO CP24: 120.0000 mg | ORAL_CAPSULE | Freq: Every day | ORAL | 0 refills | Status: DC

## 2020-10-18 MED ORDER — DICLOFENAC SODIUM 1 % EX GEL: 2.0000 g | Freq: Four times a day (QID) | CUTANEOUS | 0 refills | Status: DC | PRN

## 2020-10-18 MED ORDER — HYDRALAZINE HCL 50 MG PO TABS: 75.0000 mg | ORAL_TABLET | Freq: Three times a day (TID) | ORAL | Status: DC

## 2020-10-18 MED ORDER — ACETAMINOPHEN 500 MG PO TABS: 500.0000 mg | ORAL_TABLET | Freq: Four times a day (QID) | ORAL | 0 refills | Status: AC | PRN

## 2020-10-18 MED ORDER — LEVOTHYROXINE SODIUM 100 MCG PO TABS: 100.0000 ug | ORAL_TABLET | Freq: Every day | ORAL | 0 refills | Status: DC

## 2020-10-18 MED ORDER — PANTOPRAZOLE SODIUM 40 MG PO TBEC: 40.0000 mg | DELAYED_RELEASE_TABLET | Freq: Every day | ORAL | 0 refills | Status: DC

## 2020-10-18 MED ORDER — OXYCODONE HCL 5 MG PO TABS: 5.0000 mg | ORAL_TABLET | Freq: Four times a day (QID) | ORAL | 0 refills | Status: AC | PRN

## 2020-10-18 MED ORDER — OMEGA-3-ACID ETHYL ESTERS 1 G PO CAPS: 1.0000 g | ORAL_CAPSULE | Freq: Every day | ORAL | 0 refills | Status: DC

## 2020-10-18 MED ORDER — HYDRALAZINE HCL 25 MG PO TABS
75.0000 mg | ORAL_TABLET | Freq: Three times a day (TID) | ORAL | 0 refills | Status: DC
Start: 2020-10-18 — End: 2022-11-27

## 2020-10-18 NOTE — Progress Notes (Signed)
Physical Therapy Treatment Patient Details Name: [name] MRN: [id number] DOB: [birth year] Today's Date: 10/18/2020    History of Present Illness Patient is a 70 y/o female who presents on 10/09/20 with low back/abdominal pain and new urinary/bowel incontinence. Found to have hyponatremia, hypokalemia and hypomagnesemia. CT abdomen/pelvis showed cirrhosis with portal HTN, right pleural effusion with interval development of T10, T8 T12 and L1 compression fractures. s/p vertebroplasty 10/17/20.  PMH includes liver cirrhosis secondary to alcohol abuse, HTN.    PT Comments    Patient s/p vertebroplasty 10/17/20 and tolerating gait training and transfers with Min guard assist and use of RW for support. Slightly drowsy initially due to being pre medicated but able to wake up once mobilizing. Fatigues quickly needing 2 seated rest breaks during activity with 2/4 DOE. VSS on RA. Education on log roll technique and back precautions to protect spine. Recommend use of RW and supervision initially for OOB mobility to decrease fall risk. Will follow.   Follow Up Recommendations  Home health PT;Supervision for mobility/OOB     Equipment Recommendations  None recommended by PT    Recommendations for Other Services       Precautions / Restrictions Precautions Precautions: Back Precaution Booklet Issued: No Precaution Comments: reviewed back precautions Restrictions Weight Bearing Restrictions: No    Mobility  Bed Mobility Overal bed mobility: Needs Assistance Bed Mobility: Rolling;Sidelying to Sit Rolling: Supervision Sidelying to sit: Min guard;HOB elevated       General bed mobility comments: Cues for log roll technique with use of rail for support, increased time, no assist needed.    Transfers Overall transfer level: Needs assistance Equipment used: Rolling walker (2 wheeled) Transfers: Sit to/from Stand Sit to Stand: Min guard         General transfer comment: Min guard  for safety. Stood from [employer], from chair x2, from [facility] x1, transferred to chair post ambulation. Cues for hand placement as pt wanting to pull up on RW.  Ambulation/Gait Ambulation/Gait assistance: Min guard Gait Distance (Feet): 20 Feet (+ 18' + 10') Assistive device: Rolling walker (2 wheeled) Gait Pattern/deviations: Step-through pattern;Decreased stride length;Decreased step length - right;Decreased step length - left Gait velocity: decreased   General Gait Details: Slow, mildly unsteady gait with RW for support; 2/4 DOE. 2 seated rest breaks. VSS on RA.   Stairs             Wheelchair Mobility    Modified Rankin (Stroke Patients Only)       Balance Overall balance assessment: History of Falls;Needs assistance Sitting-balance support: Feet supported;No upper extremity supported Sitting balance-Leahy Scale: Fair     Standing balance support: During functional activity Standing balance-Leahy Scale: Poor Standing balance comment: reliant on RW                            Cognition Arousal/Alertness: Lethargic;Suspect due to medications Behavior During Therapy: [facility] for tasks assessed/performed Overall Cognitive Status: Within Functional Limits for tasks assessed                                 General Comments: Slightly lethargic but able to wake up more once sitting EOB, just given pain meds.      Exercises      General Comments        Pertinent Vitals/Pain Pain Assessment: Faces Faces Pain Scale: Hurts a little bit  Pain Location: back Pain Descriptors / Indicators: Sore Pain Intervention(s): Monitored during session;Premedicated before session    Home Living                      Prior Function            PT Goals (current goals can now be found in the care plan section) Progress towards PT goals: Progressing toward goals    Frequency    Min 3X/week      PT Plan Current plan remains appropriate     Co-evaluation              AM-PAC PT "6 Clicks" Mobility   Outcome Measure  Help needed turning from your back to your side while in a flat bed without using bedrails?: A Little Help needed moving from lying on your back to sitting on the side of a flat bed without using bedrails?: A Little Help needed moving to and from a bed to a chair (including a wheelchair)?: A Little Help needed standing up from a chair using your arms (e.g., wheelchair or bedside chair)?: A Little Help needed to walk in hospital room?: A Little Help needed climbing 3-5 steps with a railing? : A Lot 6 Click Score: 17    End of Session Equipment Utilized During Treatment: Gait belt Activity Tolerance: Patient limited by fatigue Patient left: in chair;with call bell/phone within reach;with chair alarm set Nurse Communication: Mobility status;Other (comment) (new purewick) PT Visit Diagnosis: Pain;Difficulty in walking, not elsewhere classified (R26.2);Muscle weakness (generalized) (M62.81) Pain - part of body:  (back)     Time: 2876-8115 PT Time Calculation (min) (ACUTE ONLY): 23 min  Charges:  $Gait Training: 23-37 mins                     Marisa Severin, PT, DPT Acute Rehabilitation Services Pager [phone] Office [place] 10/18/2020, 12:23 PM

## 2020-10-18 NOTE — Plan of Care (Signed)

## 2020-10-18 NOTE — Progress Notes (Signed)
PROGRESS NOTE    [name]  ZOX:[id number] DOB: [birth year] DOA: 10/08/2020 PCP: [name], MD   Chief Complaint  Patient presents with  . Back Pain  . Abdominal Pain   Brief Narrative:  70 year old with history of HTN, hypothyroidism, GERD, liver cirrhosis presents with abdominal and low back pain.  Upon admission patient had hyponatremia, hypokalemia, hypomagnesemia CT abdomen pelvis showed cirrhosis with portal hypertension, right pleural effusion with interval development of T10, T8 T12 and L1 compression fracture.  MRI confirmed these findings.  IR consulted who recommended kyphoplasty. Plan pending improvement in afib with RVR.   Assessment & Plan:   Principal Problem:   Back pain Active Problems:   HYPERTENSION, BENIGN ESSENTIAL   Hyponatremia   Hypothyroidism   Hypokalemia due to excessive gastrointestinal loss of potassium   Abdominal pain   Hypomagnesemia   Compression fracture of body of thoracic vertebra (HCC)   History of cirrhosis of liver  Atrial Fibrillation with RVR No prior hx, appears new onset Follow TSH (on low side, synthroid reduced), echo - will follow LE [place] (negative), CT PE protocol negative for PE, small right pleural effusion with right lower lobe atelectasis/infiltrate Has elevated BNP as well chadvasc at least 3 dilt gtt, start oral dilt - per cards, recommending transition to PO long acting dilt.  Not thought to be great long term anticoagulation candidate.  Follow outpatient.    Acute Metabolic Encephalopathy Suspect this is 2/2 hospital delirium in setting of opiate use Blood gas with normal pCO2 TSH low, synthroid reduced.  Follow b12 (wnl), folate (wnl), ammonia (wnl).  Delirium precautions Improved   Back pain due Thoracic compression fracture -Significant amount of pain.  CT abdomen pelvis shows compression fracture T10, T12 and L1 -Pain control-oxycodone IR 10 mg and IV morphine as needed.  Bowel regimen. -IR  consulted, MRI performed -> acute compression fx involving the T10 vertebral body with up to 60% height loss and 4 mm bony retropulsion, no significant spinal stenosis, acute compression fx involving T9 vertebral body with up to 25% height loss without significant bony retropulsion, late to subacute to chronic severe compression fractures involving the T12 and L1 vertebral bodies with 80-90% height loss and up to 4 mm bony retropulsion, no significant spinal stenosis.  Small syrinx measuring up to 3 mm involving the majority of the thoracic spinal cord, terminating at T8-9.   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 Kathyrn Sheriff  Abdominal pain, improved Liver cirrhosis with history of alcohol use History of cholecystectomy -Continue PPI.  Pain control -LFTs relatively normal, lipase normal -Right upper quadrant ultrasound-showed cirrhotic pathology -Acute hepatitis-negative -Echo- EF 55 to 60%, grade 1 DD, severely elevated pulmonary artery pressure - Outpatient GI follow-up, will need to f/u with GI regarding this -Endoscopy 06/2018- showed gastritis and multiple duodenal ulcer secondary to NSAID  Vitamin D deficiency - Supplement started  Daily tobacco use with history of COPD, mild exacerbation - steroids, bronchodilators, I-S, flutter valve -- discussed  smoking cessation and discussed possibilities of meds for assistance with  cessation, she declined  Hyponatremia - Likely 2/2 cirrhosis, slowly downtrending - mild - follow with IVF in setting of low urine sodium  -- follow urine sodium (<10), urine osm (177), serum osm (266) - looks relatively euvolemic  Hypokalemia, resolved - replace and follow as needed  Hypomagnesemia - replace and follow as needed  Hypothyroidism -Low TSH elevated T4.  Reduced to Synthroid 100 mcg daily - repeat labs in Michaella Imai few weeks  Essential hypertension: UnControlled -Continue lisinopril 40 mg daily.  Increase hydralazine 75 mg 3 times daily.   Hyperlipidemia -Continue Lovaza  Hyperglycemia Follow a1c 4.7  DVT prophylaxis: SCD Code Status: full --- addendum - family brought paperwork which noted pt DNR, confirmed this with patient and daughter [name]) -> changed to DNR Family Communication: none at bedside Disposition:   Status is: Inpatient  Remains inpatient appropriate because:Inpatient level of care appropriate due to severity of illness   Dispo: The patient is from: Home              Anticipated d/c is to: Home              Patient currently is not medically stable to d/c.   Difficult to place patient No       Consultants:   IR  cardiology  Procedures none  Antimicrobials: Anti-infectives (From admission, onward)   Start     Dose/Rate Route Frequency Ordered Stop   10/17/20 1409  ceFAZolin (ANCEF) 2-4 GM/100ML-% IVPB       Note to Pharmacy: Domenick Bookbinder   : cabinet override      10/17/20 1409 10/17/20 1417   10/17/20 1359  tobramycin (NEBCIN) 1.2 g powder       Note to Pharmacy: Mervyn Skeeters   : cabinet override      10/17/20 1359 10/17/20 1507   10/17/20 0800  ceFAZolin (ANCEF) IVPB 2g/100 mL premix        2 g 200 mL/hr over 30 Minutes Intravenous To Radiology 10/14/20 1335 10/17/20 1449   10/11/20 0600  ceFAZolin (ANCEF) IVPB 2g/100 mL premix        2 g 200 mL/hr over 30 Minutes Intravenous To Radiology 10/10/20 1448 10/12/20  0600      Subjective: No new complaints today Not eager to get up and work with therapy, still some back pain, but improved  Objective: Vitals:   10/17/20 1506 10/17/20 2012 10/18/20 0455 10/18/20 0845  BP: (!) 160/80 (!) 143/60 (!) 148/73 (!) 169/79  Pulse: 67 71 66 65  Resp: (!) 21 18 20 18   Temp:  98.3 F (36.8 C) 98.3 F (36.8 C)   TempSrc:  Oral Oral   SpO2: 100% 92% 95% 95%  Weight:   79.3 kg   Height:        Intake/Output Summary (Last 24 hours) at 10/18/2020 1040 Last data filed at 10/18/2020 0851 Gross per 24 hour  Intake 772.46 ml  Output 1500 ml  Net -727.54 ml   Filed Weights   10/16/20 0309 10/17/20 0348 10/18/20 0455  Weight: 82.9 kg 82.4 kg 79.3 kg    Examination:  General: No acute distress. Cardiovascular: Heart sounds show [place] regular rate, and rhythm.  Lungs: Clear to auscultation bilaterally  Abdomen: Soft, nontender, nondistended Neurological: Alert and oriented 3. Moves all extremities 4 . Cranial nerves II through XII grossly intact. Skin: Warm and dry. No rashes or lesions. Dressing to mid back  Extremities:  No clubbing or cyanosis. No edema.   Data Reviewed: I have personally reviewed following labs and imaging studies  CBC: Recent Labs  Lab 10/12/20 0802 10/13/20 0153 10/14/20 0336 10/15/20 0324 10/16/20 0213 10/16/20 0908 10/17/20 0642 10/18/20 0332  WBC 8.8   < > 7.2 8.4 9.8 10.5 8.0 8.5  NEUTROABS 8.2*  --  6.0  --  8.3*  --  6.2 7.0  HGB 12.3   < > 11.6* 11.7* 11.2* 12.1 11.8* 12.1  HCT 33.4*   < > 31.5* 31.5* RESULTS UNAVAILABLE DUE TO INTERFERING SUBSTANCE RESULTS UNAVAILABLE DUE TO INTERFERING SUBSTANCE RESULTS UNAVAILABLE DUE TO INTERFERING SUBSTANCE 32.3*  MCV 89.5   < > 90.0 90.0 RESULTS UNAVAILABLE DUE TO INTERFERING SUBSTANCE RESULTS UNAVAILABLE DUE TO INTERFERING SUBSTANCE RESULTS UNAVAILABLE DUE TO INTERFERING SUBSTANCE 88.3  PLT 234   < > 203 190 125* 182 153 152   < > = values in this interval not displayed.     Basic Metabolic Panel: Recent Labs  Lab 10/13/20 0153 10/14/20 0336 10/15/20 0852 10/16/20 0213 10/17/20 0642 10/18/20 0332  NA 132* 130* 129* 128* 128* 128*  K 4.4 4.1 4.0 4.2 3.8 3.6  CL 88* 87* 88* 86* 87* 92*  CO2 31 37* 36* 35* 34* 30  GLUCOSE 105* 99 93 92 79 76  BUN 16 20 15 15  7* 5*  CREATININE 0.62 0.63 0.62 0.61 0.55 0.56  CALCIUM 9.5 9.1 9.0 9.1 8.7* 8.5*  MG 1.6* 1.9  --  1.8 1.6* 2.2  PHOS  --  3.3  --  3.6 3.9 4.0    GFR: Estimated Creatinine Clearance: 65.7 mL/min (by C-G formula based on SCr of 0.56 mg/dL).  Liver Function Tests: Recent Labs  Lab 10/13/20 0153 10/14/20 0336 10/16/20 0213 10/17/20 0642 10/18/20 0332  AST 24 22 26 23 20   ALT 14 17 25 25 21   ALKPHOS 77 75 80 76 77  BILITOT 0.9 1.0 0.8 0.8 1.2  PROT 5.7* 5.5* 5.8* 5.2* 5.3*  ALBUMIN 3.2* 3.1* 3.2* 3.0* 2.9*    CBG: Recent Labs  Lab 10/13/20 0610 10/13/20 1118 10/13/20 1602 10/14/20 0607 10/14/20 1147  GLUCAP 100* 130* 150* 105* 123*     Recent Results (from the past 240 hour(s))  Resp Panel by RT-PCR (Flu Prynce Jacober&B, Covid) Nasopharyngeal Swab     Status: None   Collection Time: 10/08/20  5:33 PM   Specimen: Nasopharyngeal Swab; Nasopharyngeal(NP) swabs in vial transport medium  Result Value Ref Range Status   SARS Coronavirus 2 by RT PCR NEGATIVE NEGATIVE Final    Comment: (NOTE) SARS-CoV-2 target nucleic acids are NOT DETECTED.  The SARS-CoV-2 RNA is generally detectable in upper respiratory specimens during the acute phase of infection. The lowest concentration of SARS-CoV-2 viral copies this assay can detect is 138 copies/mL. Kayson Tasker negative result does not preclude SARS-Cov-2 infection and should not be used as the sole basis for treatment or other patient management decisions. Kandace Elrod negative result may occur with  improper specimen collection/handling, submission of specimen other than nasopharyngeal swab, presence of viral mutation(s) within the areas targeted by this  assay, and inadequate number of viral copies(<138 copies/mL). Erla Bacchi negative result must be combined with clinical observations, patient history, and epidemiological information. The expected result is Negative.  Fact Sheet for Patients:  EntrepreneurPulse.com.au  Fact Sheet for Healthcare Providers:  IncredibleEmployment.be  This test is no t yet approved or cleared by the [place] FDA and  has been authorized for detection and/or diagnosis of  SARS-CoV-2 by FDA under an Emergency Use Authorization (EUA). This EUA will remain  in effect (meaning this test can be used) for the duration of the COVID-19 declaration under Section 564(b)(1) of the Act, 21 U.S.C.section 360bbb-3(b)(1), unless the authorization is terminated  or revoked sooner.       Influenza Adama Ferber by PCR NEGATIVE NEGATIVE Final   Influenza B by PCR NEGATIVE NEGATIVE Final    Comment: (NOTE) The Xpert Xpress SARS-CoV-2/FLU/RSV plus assay is intended as an aid in the diagnosis of influenza from Nasopharyngeal swab specimens and should not be used as Clayton Jarmon sole basis for treatment. Nasal washings and aspirates are unacceptable for Xpert Xpress SARS-CoV-2/FLU/RSV testing.  Fact Sheet for Patients: EntrepreneurPulse.com.au  Fact Sheet for Healthcare Providers: IncredibleEmployment.be  This test is not yet approved or cleared by the [place] FDA and has been authorized for detection and/or diagnosis of SARS-CoV-2 by FDA under an Emergency Use Authorization (EUA). This EUA will remain in effect (meaning this test can be used) for the duration of the COVID-19 declaration under Section 564(b)(1) of the Act, 21 U.S.C. section 360bbb-3(b)(1), unless the authorization is terminated or revoked.  Performed at [facility], [place] [address], [place], [place] [address]          Radiology Studies: No results found.      Scheduled Meds: .  cholecalciferol  1,000 Units Oral Daily  . citalopram  20 mg Oral Daily  . diclofenac Sodium  2 g Topical QID  . diltiazem  120 mg Oral Daily  . docusate sodium  100 mg Oral Daily  . feeding supplement  237 mL Oral BID BM  . hydrALAZINE  75 mg Oral TID  . levothyroxine  100 mcg Oral Q0600  . lisinopril  40 mg Oral Daily  . magnesium oxide  400 mg Oral Daily  . omega-3 acid ethyl esters  1 g Oral Daily  . pantoprazole  40 mg Oral QHS   Continuous Infusions: . sodium chloride 125 mL/hr at 10/18/20 0844     LOS: 10 days    Time spent: over 30 min    Fayrene Helper, MD [facility]   To contact the attending provider between 7A-7P or the covering provider during after hours 7P-7A, please log into the web site [email or web address] and access using universal [place] password for that web site. If you do not have the password, please call the hospital operator.  10/18/2020, 10:40 AM

## 2020-10-18 NOTE — Discharge Summary (Addendum)
Physician Discharge Summary  [name] [id number] DOB: May 10, 1951 DOA: 10/08/2020  PCP: [name], MD  Admit date: 10/08/2020 Discharge date: 10/18/2020  Time spent: 40 minutes  Recommendations for Outpatient Follow-up:  1. Follow outpatient CBC/CMP 2. Follow back pain outpatient - follow up with IR as needed 3. Follow aneurysm outpatient with neurosurgery (as well as hydromelia) 4. Follow atrial fibrillation outpatient with cardiology - started on diltiazem, not felt to be great oral anticoagulation candidate - follow and discuss outpatient 5. Follow up with gastroenterology for cirrhosis  6. Hyponatremia -> follow outpatient - consider d/c SSRI and additional w/u if persistent  7. Follow thyroid labs in 4-6 weeks (dose reduced here) 8. Follow blood pressure outpatient  9. Severe pulm hypertension, follow outpatient with cardiology 10. Radiology recommended follow up liver MRI (no lesions seen in [place]) can follow outpatient 11. Follow up CXR outpatient to follow effusion  Discharge Diagnoses:  Principal Problem:   Back pain Active Problems:   HYPERTENSION, BENIGN ESSENTIAL   Hyponatremia   Hypothyroidism   Hypokalemia due to excessive gastrointestinal loss of potassium   Abdominal pain   Hypomagnesemia   Compression fracture of body of thoracic vertebra (HCC)   History of cirrhosis of liver   Discharge Condition: stable  Diet recommendation: heart healthy  Filed Weights   10/16/20 0309 10/17/20 0348 10/18/20 0455  Weight: 82.9 kg 82.4 kg 79.3 kg    History of present illness:  70 year old with history of HTN, hypothyroidism, GERD, liver cirrhosis presents with abdominal and low back pain. Upon admission patient had hyponatremia, hypokalemia, hypomagnesemia CT abdomen pelvis showed cirrhosis with portal hypertension, right pleural effusion with interval development of T10, T8 T12 and L1 compression fracture. MRI confirmed these findings.  Hospitalization was complicated by Birdena Kingma fib with RVR.  Cardiology was consulted and he was on heparin and diltiazem gtt.  Not thought to be good long term anticoagulation candidate.  Transitioned to oral diltiazem at discharge.  She's now s/p kyphoplasty by IR on 4/25.  Discharged on 4/26 in stable condition.  See below for additional details.   Hospital Course:  Atrial Fibrillation with RVR No prior hx, appears new onset Follow TSH (on low side, synthroid reduced), echo (EF 0000000, grade 1 diastolic dysfunction, elevated RVSP - see report) - will follow LE [place] (negative), CT PE protocol negative for PE, small right pleural effusion with right lower lobe atelectasis/infiltrate Has elevated BNP as well chadvasc at least 3 per cards, recommending transition to PO long acting dilt.  Not thought to be great long term anticoagulation candidate.  Follow outpatient.    Acute Metabolic Encephalopathy Suspect this is 2/2 hospital delirium in setting of opiate use Blood gas with normal pCO2 TSH low, synthroid reduced.  Follow b12 (wnl), folate (wnl), ammonia (wnl).  Delirium precautions Improved   Back pain due Thoracic compression fracture -Significant amount of pain. CT abdomen pelvis shows compression fracture T10, T12 and L1 -Pain control-oxycodone IR 10 mg and IV morphine as needed. Bowel regimen. -IR consulted, MRI performed -> acute compression fx involving the T10 vertebral body with up to 60% height loss and 4 mm bony retropulsion, no significant spinal stenosis, acute compression fx involving T9 vertebral body with up to 25% height loss without significant bony retropulsion, late to subacute to chronic severe compression fractures involving the T12 and L1 vertebral bodies with 80-90% height loss and up to 4 mm bony retropulsion, no significant spinal stenosis.  Small syrinx measuring up to 3 mm involving  the majority of the thoracic spinal cord, terminating at T8-9. 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 Kathyrn Sheriff  Abdominal pain, improved Liver cirrhosis with history of alcohol use History of cholecystectomy -Continue PPI. Pain control -LFTs relatively normal, lipase normal -Right upper quadrant ultrasound-showed cirrhotic pathology -Acute hepatitis-negative -Echo-EF 55 to 60%, grade 1 DD, severely elevated pulmonary artery pressure -Outpatient GI follow-up, will need to f/u with GI regarding this -Endoscopy 06/2018-showed gastritis and multiple duodenal ulcer secondary to NSAID  Vitamin D deficiency -Supplement started  Daily tobacco use with history of COPD, mild exacerbation - steroids, bronchodilators, I-S, flutter valve -- discussed smoking cessation and discussed possibilities of meds for assistance with cessation, she declined  Hyponatremia - Likely 2/2 cirrhosis, slowly downtrending - mild - follow with IVF in setting of low urine sodium - mild improvement, relatively stable - negative orthostatics - she's stable for discharge, can follow this outpatient  -- follow urine sodium (<10), urine osm (177), serum osm (266) - looks relatively euvolemic - negative orthostatics on day of discharge - follow outpatient consider d/c SSRI if persistent  Hypokalemia, resolved - replace and follow as needed  Hypomagnesemia - replace and follow as needed  Hypothyroidism -Low  TSH elevated T4.Reducedto Synthroid 100 mcg daily - repeat labs in Marita Burnsed few weeks  Essential hypertension:UnControlled -Continue lisinopril 40 mg daily. Increase hydralazine 75 mg 3 times daily.   Hyperlipidemia -Continue Lovaza  Hyperglycemia Follow a1c 4.7  Procedures: Echo IMPRESSIONS    1. Left ventricular ejection fraction, by estimation, is 55 to 60%. The  left ventricle has normal function. The left ventricle has no regional  wall motion abnormalities. There is moderate asymmetric hypertrophy of the  basal septal segment. The rest of  the LV segments demonstrate mild left ventricular hypertrophy. Left  ventricular diastolic parameters are consistent with Grade I diastolic  dysfunction (impaired relaxation). Elevated left atrial pressure.  2. Right ventricular systolic function is normal. The right ventricular  size is mildly enlarged. There is severely elevated pulmonary artery  systolic pressure. The estimated right ventricular systolic pressure is  [id number] mmHg.  3. Left atrial size was severely dilated.  4. Right atrial size was mildly dilated.  5. The mitral valve is abnormal. There are mildly elevated gradients with  flow acceleration noted across the mitral valve with mean gradient 16mmHg  at HR 77bpm. MVA by continuity 2.7cm2 with no significant mitral stenosis.  Mild mitral valve  regurgitation.  6. The aortic valve is tricuspid. There is mild calcification of the  aortic valve. There is moderate thickening of the aortic valve. The [place]  appears fixed with trivial AR. Mild to moderate aortic valve  sclerosis/calcification is present, without any  evidence of aortic stenosis.  7. The inferior vena cava is normal in size with greater than 50%  respiratory variability, suggesting right atrial pressure of 3 mmHg.   Comparison(s): Compared to prior TTE in 2016, there is now severe  pulmonary  HTN present with PASP (previously ).   LE  [place] Summary:  RIGHT:  - There is no evidence of deep vein thrombosis in the lower extremity.  However, portions of this examination were limited- see technologist  comments above.    - No cystic structure found in the popliteal fossa.    LEFT:  - There is no evidence of deep vein thrombosis in the lower extremity.  However, portions of this examination were limited- see technologist  comments above.    - No cystic structure found in the popliteal fossa.    S/P T 9 and T 10 VP. S.Deveshwar MD  Consultations:  IR  Cardiology  Neurosurgery by phone  Discharge Exam: Vitals:   10/18/20 0845 10/18/20 1127  BP: (!) 169/79 (!) 153/70  Pulse: 65 76  Resp: 18 20  Temp:  98.3 F (36.8 C)  SpO2: 95% 95%   See progress note from today for exam  Discharge Instructions   Discharge Instructions    (HEART FAILURE PATIENTS) Call MD:  Anytime you have any of the following symptoms: 1) 3 pound weight gain in 24 hours or 5 pounds in 1 week 2) shortness of breath, with or without Jan Walters dry hacking cough 3) swelling in the hands, feet or stomach 4) if you have to sleep on extra pillows at night in order to breathe.   Complete by: As directed    Call MD for:  difficulty breathing, headache or visual disturbances   Complete by: As directed    Call MD for:  extreme fatigue   Complete by: As directed    Call MD for:  hives   Complete by: As directed    Call MD for:  persistant dizziness or light-headedness   Complete by: As directed    Call MD for:  persistant nausea and vomiting   Complete by: As directed    Call MD for:  redness, tenderness, or signs of infection (pain, swelling, redness, odor or green/yellow discharge around incision site)   Complete by: As directed    Call MD for:  severe uncontrolled pain   Complete by: As directed    Call MD for:  temperature >100.4   Complete by: As directed    Diet - low sodium heart healthy   Complete by: As directed    Discharge  instructions   Complete by: As directed    You were seen for compression fractures.    You've improved after Terry Abila kyphoplasty.    We'll send you home with Korissa Horsford few days of oxycodone.  You can also use voltaren gel as needed and tylenol as needed (keep your total tylenol dose less than 2 grams daily).    You'll need to follow up as an outpatient with neurosurgery for your aneurysm and other incidental findings seen on your imaging.   You have atrial fibrillation, we've adjusted some of your medicines to help control the heart rate.  Follow up with cardiology as an outpatient.  You're not thought to be Shelba Susi great anticoagulation candidate at this time, but you can follow up with cardiology to discuss this further.  Your sodium level is Kasidy Gianino little bit low.  This is related to Cyler Kappes low  Please follow up with your PCP within Aaliyan Brinkmeier week to repeat labs to follow this up.   Your synthroid dose has been adjusted.  You'll need repeat labs in 4-6 weeks.  Follow the pulmonary hypertension with cardiology outpatient.   You have cirrhosis.  Alcohol cessation is extremely important.  Please follow up with gastroenterology as an outpatient.  Radiology recommended Alexander Mcauley liver MRI, please follow this up outpatient.  Smoking cessation will also be important for you.  Return for new, recurrent, or worsening symptoms.  Please ask your PCP to request records from this hospitalization so they know what was done and what the next steps will be.   Discharge wound care:   Complete by: As directed    Wound care per interventional radiology   Increase activity slowly   Complete by: As directed      Allergies as of 10/18/2020   No Active Allergies     Medication List    STOP taking these medications   amLODipine 10 MG tablet Commonly known as: NORVASC     TAKE these medications   acetaminophen 500 MG tablet Commonly known as: TYLENOL Take 1 tablet (500 mg total) by mouth every 6 (six) hours as needed for mild pain, fever  or headache.   B COMPLEX 1 PO Take 1 tablet by mouth daily.   BILBERRY FRUIT PO Take 1 tablet by mouth daily.   calcium carbonate 1500 (600 Ca) MG Tabs tablet Commonly known as: OSCAL Take 600 mg of elemental calcium by mouth daily with breakfast.   citalopram 20 MG tablet Commonly known as: CELEXA TAKE 1 TABLET BY MOUTH EVERY DAY.   diclofenac Sodium 1 % Gel Commonly known as: VOLTAREN Apply 2 g topically 4 (four) times daily as needed (pain).   diltiazem 120 MG 24 hr capsule Commonly known as: CARDIZEM CD Take 1 capsule (120 mg total) by mouth daily. Start taking on: October 19, 2020   ferrous sulfate 325 (65 FE) MG EC tablet Take 1 tablet (325 mg total) by mouth 3 (three) times daily with meals for 30 days.   Fish Oil Concentrate 1000 MG Caps Take 1,000 mg by mouth daily.   FLAX SEED OIL PO Take 1 tablet by mouth daily.   folic acid 858 MCG tablet Commonly known as: FOLVITE Take 400 mcg by mouth daily.   hydrALAZINE 25 MG tablet Commonly known as: APRESOLINE Take 3 tablets (75 mg total) by mouth 3 (three) times daily. What changed:   medication strength  how much to take   levothyroxine 100 MCG tablet Commonly known as: SYNTHROID Take 1 tablet (100 mcg total) by mouth daily at 6 (six) AM. Please follow up repeat labs in 4-6 weeks with PCP. Start taking on: October 19, 2020 What changed:   medication strength  how much to take  when to take this  additional instructions   lisinopril 40 MG tablet Commonly known as: ZESTRIL Take 40 mg by mouth daily.   LUTEIN PO Take 1 tablet by mouth daily.   MULTIVITAMIN & MINERAL PO Take 1 tablet by mouth daily.   multivitamin-lutein Caps capsule Take 1 capsule by mouth daily.   multivitamin with minerals Tabs tablet Take 1 tablet by mouth daily.   omega-3 acid ethyl esters 1 g capsule Commonly known as: LOVAZA Take 1 capsule (1 g total) by mouth daily.   oxyCODONE 5 MG immediate release tablet Commonly  known as: Roxicodone Take 1 tablet (5 mg total) by mouth every 6 (six) hours as needed for up to 3 days.   pantoprazole 40 MG tablet Commonly known as: PROTONIX Take 1 tablet (40 mg total) by mouth at bedtime.   vitamin C 100 MG tablet Take 100 mg by mouth daily.   VITAMIN E  PO Take 1 tablet by mouth daily.            Durable Medical Equipment  (From admission, onward)         Start     Ordered   10/18/20 1136  DME 3-in-1  Once        10/18/20 1139           Discharge Care Instructions  (From admission, onward)         Start     Ordered   10/18/20 0000  Discharge wound care:       Comments: Wound care per interventional radiology   10/18/20 1139         No Active Allergies  Follow-up Information    Consuella Lose, MD Follow up.   Specialty: Neurosurgery Why: follow up with Dr. Kathyrn Sheriff as an outpatient for your aneurysm  Contact information: [address] [place] [address] [phone]        [name], MD. Call in 1 week(s).   Specialty: Family Medicine Why: Left message for office to call patient Contact information: [place] [address] [phone]        Elouise Munroe, MD.   Specialties: Cardiology, Radiology Contact information: [place] [address] [phone]        Luanne Bras, MD Follow up.   Specialties: Interventional Radiology, Radiology Why: You do not need follow up after your kyphoplasty unless you are having significant pain after 2 weeks. If you are in significant pain please call our office at [phone] or [phone] to schedule an appointment. Contact information: [place] [address] [phone]        [facility] Follow up.   Why: 3 n 1  Contact information: [place] [address] [phone]                The results of significant diagnostics from  this hospitalization (including imaging, microbiology, ancillary and laboratory) are listed below for reference.    Significant Diagnostic Studies: CT ANGIO HEAD W OR WO CONTRAST  Result Date: 10/12/2020 CLINICAL DATA:  Delirium.  Cerebral aneurysm. EXAM: CT ANGIOGRAPHY HEAD AND NECK TECHNIQUE: Multidetector CT imaging of the head and neck was performed using the standard protocol during bolus administration of intravenous contrast. Multiplanar CT image reconstructions and MIPs were obtained to evaluate the vascular anatomy. Carotid stenosis measurements (when applicable) are obtained utilizing NASCET criteria, using the distal internal carotid diameter as the denominator. CONTRAST:  39mL OMNIPAQUE IOHEXOL 350 MG/ML SOLN COMPARISON:  CT head 10/11/2020 FINDINGS: CTA NECK FINDINGS Aortic arch: Standard branching. Imaged portion shows no evidence of aneurysm or dissection. No significant stenosis of the major arch vessel origins. Mild atherosclerotic disease aortic arch and proximal great vessels. Right carotid system: Mild atherosclerotic disease right carotid bifurcation. Negative for stenosis. Left carotid system: Mild atherosclerotic disease left carotid bifurcation. Negative for stenosis. Vertebral arteries: Both vertebral arteries patent to the basilar. Mild calcific stenosis distal left vertebral artery at the skull base. Skeleton: Mild degenerative change cervical spine. No acute skeletal abnormality. Other neck: Negative Upper chest: Lung apices clear bilaterally. Review of the MIP images confirms the above findings CTA HEAD FINDINGS Anterior circulation: Cavernous carotid widely patent bilaterally. Anterior middle cerebral arteries normal bilaterally. No stenosis or aneurysm in the anterior circulation. Posterior circulation: Both vertebral arteries patent to the basilar. Mild stenosis distal left vertebral artery at the skull  base. Right PICA patent. Left PICA not visualized. Basilar widely patent.  AICA, superior cerebellar, and posterior cerebral arteries widely patent without stenosis. 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 Mildly lobulated or margins. Aneurysm measures approximately 8 x 9 mm in diameter. No evidence of rupture or thrombus. Venous sinuses: Normal venous enhancement Anatomic variants: None Review of the MIP images confirms the above findings IMPRESSION: 1. 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 No other aneurysm 2. No intracranial stenosis or large vessel occlusion 3. No significant carotid or vertebral artery stenosis. Electronically Signed   By: Franchot Gallo M.D.   On: 10/12/2020 11:38   CT HEAD WO CONTRAST  Result Date: 10/12/2020 CLINICAL DATA:  Delirium EXAM: CT HEAD WITHOUT CONTRAST TECHNIQUE: Contiguous axial images were obtained from the base of the skull through the vertex without intravenous contrast. COMPARISON:  None. FINDINGS: Brain: Cerebral ventricle sizes are concordant with the degree of cerebral volume loss. Patchy and confluent areas of decreased attenuation are noted throughout the deep and periventricular white matter of the cerebral hemispheres bilaterally, compatible with chronic microvascular ischemic disease. No evidence of large-territorial acute infarction. No parenchymal hemorrhage. No mass lesion. No extra-axial collection. No mass effect or midline shift. No hydrocephalus. Basilar cisterns are patent. Vascular: Aneurysmal dilatation of the basilar artery (6:26, 3:11). Skull: Heterogeneous appearance of the osseous structures. No acute fracture or focal lesion. Sinuses/Orbits: Paranasal sinuses and mastoid air cells are clear. The orbits are unremarkable. Other: None. IMPRESSION: 1. Aneurysmal dilatation of the basilar artery. Recommend CT angioagraphy for further evaluation. 2. Nonspecific heterogeneous appearance of the osseous structures. 3. No acute intracranial abnormality. These results will be called to  the ordering clinician or representative by the Radiologist Assistant, and communication documented in the PACS or [employer]. Electronically Signed   By: Iven Finn M.D.   On: 10/12/2020 00:00   CT ANGIO NECK W OR WO CONTRAST  Result Date: 10/12/2020 CLINICAL DATA:  Delirium.  Cerebral aneurysm. EXAM: CT ANGIOGRAPHY HEAD AND NECK TECHNIQUE: Multidetector CT imaging of the head and neck was performed using the standard protocol during bolus administration of intravenous contrast. Multiplanar CT image reconstructions and MIPs were obtained to evaluate the vascular anatomy. Carotid stenosis measurements (when applicable) are obtained utilizing NASCET criteria, using the distal internal carotid diameter as the denominator. CONTRAST:  50mL OMNIPAQUE IOHEXOL 350 MG/ML SOLN COMPARISON:  CT head 10/11/2020 FINDINGS: CTA NECK FINDINGS Aortic arch: Standard branching. Imaged portion shows no evidence of aneurysm or dissection. No significant stenosis of the major arch vessel origins. Mild atherosclerotic disease aortic arch and proximal great vessels. Right carotid system: Mild atherosclerotic disease right carotid bifurcation. Negative for stenosis. Left carotid system: Mild atherosclerotic disease left carotid bifurcation. Negative for stenosis. Vertebral arteries: Both vertebral arteries patent to the basilar. Mild calcific stenosis distal left vertebral artery at the skull base. Skeleton: Mild degenerative change cervical spine. No acute skeletal abnormality. Other neck: Negative Upper chest: Lung apices clear bilaterally. Review of the MIP images confirms the above findings CTA HEAD FINDINGS Anterior circulation: Cavernous carotid widely patent bilaterally. Anterior middle cerebral arteries normal bilaterally. No stenosis or aneurysm in the anterior circulation. Posterior circulation: Both vertebral arteries patent to the basilar. Mild stenosis distal left vertebral artery at the skull base. Right PICA  patent. Left PICA not visualized. Basilar widely patent. AICA, superior cerebellar, and posterior cerebral arteries widely patent without stenosis. 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 Mildly lobulated or margins.  Aneurysm measures approximately 8 x 9 mm in diameter. No evidence of rupture or thrombus. Venous sinuses: Normal venous enhancement Anatomic variants: None Review of the MIP images confirms the above findings IMPRESSION: 1. 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 No other aneurysm 2. No intracranial stenosis or large vessel occlusion 3. No significant carotid or vertebral artery stenosis. Electronically Signed   By: Franchot Gallo M.D.   On: 10/12/2020 11:38   CT ANGIO CHEST PE W OR WO CONTRAST  Result Date: 10/13/2020 CLINICAL DATA:  Positive D-dimer.  Shortness of breath EXAM: CT ANGIOGRAPHY CHEST WITH CONTRAST TECHNIQUE: Multidetector CT imaging of the chest was performed using the standard protocol during bolus administration of intravenous contrast. Multiplanar CT image reconstructions and MIPs were obtained to evaluate the vascular anatomy. CONTRAST:  33mL OMNIPAQUE IOHEXOL 350 MG/ML SOLN COMPARISON:  None. FINDINGS: Cardiovascular: No filling defects in the pulmonary arteries to suggest pulmonary emboli. Heart is mildly enlarged. Aorta normal caliber. Scattered aortic calcifications. Mediastinum/Nodes: No mediastinal, hilar, or axillary adenopathy. Trachea and esophagus are unremarkable. Thyroid unremarkable. Lungs/Pleura: Small right pleural effusion. Atelectasis or consolidation in the right lower lobe. No confluent opacity on the left. Upper Abdomen: Nodular contours of the liver suggest cirrhosis. Musculoskeletal: Chest wall soft tissues are unremarkable. Multiple compression fractures in the mid to lower thoracic spine and upper lumbar spine. Review of the MIP images confirms the above findings. IMPRESSION: Cardiomegaly. No evidence of  pulmonary embolus. Small right pleural effusion with right lower lobe atelectasis or infiltrate. Multiple compression fractures in the mid to lower thoracic spine and upper lumbar spine. Suspect changes of cirrhosis. Aortic Atherosclerosis (ICD10-I70.0). Electronically Signed   By: Rolm Baptise M.D.   On: 10/13/2020 17:07   MR CERVICAL SPINE WO CONTRAST  Result Date: 10/13/2020 CLINICAL DATA:  Neck and back pain after fall.  [place]. EXAM: MRI CERVICAL SPINE WITHOUT CONTRAST TECHNIQUE: Multiplanar, multisequence MR imaging of the cervical spine was performed. No intravenous contrast was administered. COMPARISON:  Thoracic MRI 10/09/2020 FINDINGS: Alignment: Slight C6-7 anterolisthesis. Vertebrae: No fracture, evidence of discitis, or bone lesion. Cord: Hydromyelia seen from C4-5 into the thoracic spine, measuring up to 2-3 mm in the thoracic spine. No underlying cord swelling, mass, or impingement. Posterior Fossa, vertebral arteries, paraspinal tissues: Mild T2 hyperintensity in the pons attributed to chronic small vessel ischemia. Disc levels: Disc narrowing and bulging especially at C3-4 to C5-6. Mild facet spurring and ligamentum flavum buckling at the same levels. Diffusely patent spinal canal. 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 No underlying cord lesion, compression, or edema. Ordinary cervical spine degeneration. Electronically Signed   By: Monte Fantasia M.D.   On: 10/13/2020 09:19   MR THORACIC SPINE WO CONTRAST  Result Date: 10/09/2020 CLINICAL DATA:  Initial evaluation for acute spine fracture. EXAM: MRI THORACIC AND LUMBAR SPINE WITHOUT CONTRAST TECHNIQUE: Multiplanar and multiecho pulse sequences of the thoracic and lumbar spine were obtained without intravenous contrast. COMPARISON:  Prior CT from 10/08/2020. FINDINGS: MRI THORACIC SPINE FINDINGS Alignment: Trace sigmoid scoliosis. Alignment otherwise normal with preservation of the normal thoracic  kyphosis. No listhesis. Vertebrae: Acute biconcave compression fracture seen involving the T9 vertebral body. Associated mild 25% central height loss with trace 3 mm bony retropulsion. Additional acute appearing compression fracture involving the T10 vertebral body with up to 60% height loss and 4 mm bony retropulsion. Associated marrow edema about these fractures extend to involve the posterior elements. Severe compression deformities  involving the T12 and L1 vertebral bodies with up to 80-90% height loss and 4 mm bony retropulsion are late subacute to chronic in appearance with only trace residual marrow edema. Additional mild chronic compression fracture involving the T6 vertebral body with up to 30% height loss without bony retropulsion. Vertebral body height otherwise maintained with no other acute or chronic fracture. Underlying bone marrow signal intensity is markedly heterogeneous, nonspecific, but could reflect underlying osteoporosis. No discrete or worrisome osseous lesions. No other abnormal marrow edema. Cord: Small syrinx involving the thoracic spinal cord measures up to 3 mm in maximal diameter, extending from the visualized upper cord to approximately the T8-9 level. Signal intensity within the cord is otherwise normal. Underlying normal cord caliber and morphology. Paraspinal and other soft tissues: Mild paraspinous edema adjacent to the acute T9 and T10 compression fractures. Paraspinous soft tissues demonstrate no other acute finding. Moderate layering right pleural effusion. Visualized visceral structures otherwise unremarkable. Disc levels: Normal expected for age multilevel disc desiccation seen throughout the thoracic spine. No significant disc bulge or focal disc herniation. Three-4 mm bony retropulsion at the levels of T10, T12, and L1 related to the underlying compression fractures without significant spinal stenosis. No other significant spinal stenosis within the thoracic spine. Foramina  remain patent. MRI LUMBAR SPINE FINDINGS Segmentation: Standard. Lowest well-formed disc space labeled the L5-S1 level. Alignment: Mild exaggeration of the normal lumbar lordosis. No listhesis. Vertebrae: Late subacute to chronic compression fractures involving the T12 and L1 vertebral bodies again noted. Vertebral body height otherwise maintained with no other acute, subacute, or chronic compression fracture. Underlying bone marrow signal intensity is markedly heterogeneous without discrete or worrisome osseous lesion. No other abnormal marrow edema. Conus medullaris and cauda equina: Conus extends to the T12-L1 level. Conus and cauda equina appear normal. Paraspinal and other soft tissues: Chronic fatty atrophy noted throughout the posterior paraspinous musculature. Paraspinous soft tissues demonstrate no acute finding. 1.8 cm simple cyst noted within the interpolar left kidney. Additional 9 mm T1 hyperintense lesion at the inferior pole the left kidney noted, nonspecific, but likely Tagen Brethauer small proteinaceous and/or hemorrhagic cyst. Hepatic cirrhosis partially visualized. Disc levels: L1-2: Minimal annular disc bulge. Mild facet hypertrophy. No spinal stenosis. Foramina remain patent. L2-3: Disc desiccation with minimal annular disc bulge. Mild facet hypertrophy. No stenosis. L3-4: Minimal annular disc bulge. Mild facet hypertrophy. No canal or foraminal stenosis. L4-5: Disc desiccation with minimal annular disc bulge. Mild facet hypertrophy. No stenosis. L5-S1: Negative interspace. Mild facet hypertrophy. No canal or foraminal stenosis. IMPRESSION: 1. Acute compression fracture involving the T10 vertebral body with up to 60% height loss and 4 mm bony retropulsion. No significant spinal stenosis. 2. Acute compression fracture involving the T9 vertebral body with up to 25% height loss without significant bony retropulsion. 3. Late subacute to chronic severe compression fractures involving the T12 and L1 vertebral  bodies with 80-90% height loss and up to 4 mm bony retropulsion. No significant spinal stenosis. 4. Markedly heterogeneous appearance of the visualized bone marrow, nonspecific, but could reflect underlying osteoporosis. 5. Small syrinx measuring up to 3 mm involving the majority of the thoracic spinal cord, terminating at the level of T8-9. 6. Moderate layering right pleural effusion. 7. Hepatic cirrhosis. Electronically Signed   By: Jeannine Boga M.D.   On: 10/09/2020 20:23   MR LUMBAR SPINE WO CONTRAST  Result Date: 10/09/2020 CLINICAL DATA:  Initial evaluation for acute spine fracture. EXAM: MRI THORACIC AND LUMBAR SPINE WITHOUT CONTRAST TECHNIQUE: Multiplanar and  multiecho pulse sequences of the thoracic and lumbar spine were obtained without intravenous contrast. COMPARISON:  Prior CT from 10/08/2020. FINDINGS: MRI THORACIC SPINE FINDINGS Alignment: Trace sigmoid scoliosis. Alignment otherwise normal with preservation of the normal thoracic kyphosis. No listhesis. Vertebrae: Acute biconcave compression fracture seen involving the T9 vertebral body. Associated mild 25% central height loss with trace 3 mm bony retropulsion. Additional acute appearing compression fracture involving the T10 vertebral body with up to 60% height loss and 4 mm bony retropulsion. Associated marrow edema about these fractures extend to involve the posterior elements. Severe compression deformities involving the T12 and L1 vertebral bodies with up to 80-90% height loss and 4 mm bony retropulsion are late subacute to chronic in appearance with only trace residual marrow edema. Additional mild chronic compression fracture involving the T6 vertebral body with up to 30% height loss without bony retropulsion. Vertebral body height otherwise maintained with no other acute or chronic fracture. Underlying bone marrow signal intensity is markedly heterogeneous, nonspecific, but could reflect underlying osteoporosis. No discrete or  worrisome osseous lesions. No other abnormal marrow edema. Cord: Small syrinx involving the thoracic spinal cord measures up to 3 mm in maximal diameter, extending from the visualized upper cord to approximately the T8-9 level. Signal intensity within the cord is otherwise normal. Underlying normal cord caliber and morphology. Paraspinal and other soft tissues: Mild paraspinous edema adjacent to the acute T9 and T10 compression fractures. Paraspinous soft tissues demonstrate no other acute finding. Moderate layering right pleural effusion. Visualized visceral structures otherwise unremarkable. Disc levels: Normal expected for age multilevel disc desiccation seen throughout the thoracic spine. No significant disc bulge or focal disc herniation. Three-4 mm bony retropulsion at the levels of T10, T12, and L1 related to the underlying compression fractures without significant spinal stenosis. No other significant spinal stenosis within the thoracic spine. Foramina remain patent. MRI LUMBAR SPINE FINDINGS Segmentation: Standard. Lowest well-formed disc space labeled the L5-S1 level. Alignment: Mild exaggeration of the normal lumbar lordosis. No listhesis. Vertebrae: Late subacute to chronic compression fractures involving the T12 and L1 vertebral bodies again noted. Vertebral body height otherwise maintained with no other acute, subacute, or chronic compression fracture. Underlying bone marrow signal intensity is markedly heterogeneous without discrete or worrisome osseous lesion. No other abnormal marrow edema. Conus medullaris and cauda equina: Conus extends to the T12-L1 level. Conus and cauda equina appear normal. Paraspinal and other soft tissues: Chronic fatty atrophy noted throughout the posterior paraspinous musculature. Paraspinous soft tissues demonstrate no acute finding. 1.8 cm simple cyst noted within the interpolar left kidney. Additional 9 mm T1 hyperintense lesion at the inferior pole the left kidney  noted, nonspecific, but likely Kevia Zaucha small proteinaceous and/or hemorrhagic cyst. Hepatic cirrhosis partially visualized. Disc levels: L1-2: Minimal annular disc bulge. Mild facet hypertrophy. No spinal stenosis. Foramina remain patent. L2-3: Disc desiccation with minimal annular disc bulge. Mild facet hypertrophy. No stenosis. L3-4: Minimal annular disc bulge. Mild facet hypertrophy. No canal or foraminal stenosis. L4-5: Disc desiccation with minimal annular disc bulge. Mild facet hypertrophy. No stenosis. L5-S1: Negative interspace. Mild facet hypertrophy. No canal or foraminal stenosis. IMPRESSION: 1. Acute compression fracture involving the T10 vertebral body with up to 60% height loss and 4 mm bony retropulsion. No significant spinal stenosis. 2. Acute compression fracture involving the T9 vertebral body with up to 25% height loss without significant bony retropulsion. 3. Late subacute to chronic severe compression fractures involving the T12 and L1 vertebral bodies with 80-90% height loss and up  to 4 mm bony retropulsion. No significant spinal stenosis. 4. Markedly heterogeneous appearance of the visualized bone marrow, nonspecific, but could reflect underlying osteoporosis. 5. Small syrinx measuring up to 3 mm involving the majority of the thoracic spinal cord, terminating at the level of T8-9. 6. Moderate layering right pleural effusion. 7. Hepatic cirrhosis. Electronically Signed   By: Jeannine Boga M.D.   On: 10/09/2020 20:23   CT Abdomen Pelvis W Contrast  Result Date: 10/08/2020 CLINICAL DATA:  Nonlocalized acute abdominal pain. Left-sided abdominal pain for 2 months. EXAM: CT ABDOMEN AND PELVIS WITH CONTRAST TECHNIQUE: Multidetector CT imaging of the abdomen and pelvis was performed using the standard protocol following bolus administration of intravenous contrast. CONTRAST:  170mL OMNIPAQUE IOHEXOL 300 MG/ML  SOLN COMPARISON:  CT abdomen pelvis 12/05/2015 FINDINGS: Lower chest: Trace right  pleural effusion with associated right lower lobe passive atelectasis. Mitral annular calcifications. Possible tiny hiatal hernia. Hepatobiliary: Nodular hepatic contour. The hepatic parenchyma is diffusely hypodense compared to the splenic parenchyma consistent with fatty infiltration. No focal liver abnormality. Status post cholecystectomy. No biliary dilatation. Pancreas: No focal lesion. Normal pancreatic contour. No surrounding inflammatory changes. No main pancreatic ductal dilatation. Spleen: Normal in size without focal abnormality. Adrenals/Urinary Tract: No adrenal nodule bilaterally. Bilateral kidneys enhance symmetrically. 1.7 cm fluid density lesion within the left kidney likely represents Hallis Meditz simple renal cyst. No hydronephrosis. No hydroureter. The urinary bladder is unremarkable. On delayed imaging, there is no urothelial wall thickening and there are no filling defects in the opacified portions of the bilateral collecting systems or ureters. Stomach/Bowel: Stomach is within normal limits. No evidence of bowel wall thickening or dilatation. Appendix appears normal. Vascular/Lymphatic: Recanalized paraumbilical vein. Venous collateral are noted. Slight left ovarian venous congestion. The main portal, splenic, superior mesenteric veins are patent. No abdominal aorta or iliac aneurysm. Moderate to severe atherosclerotic plaque of the aorta and its branches. No abdominal, pelvic, or inguinal lymphadenopathy. Reproductive: Uterus and bilateral adnexa are unremarkable. Other: No intraperitoneal free fluid. No intraperitoneal free gas. No organized fluid collection. Musculoskeletal: No abdominal wall hernia or abnormality. Diffusely decreased bone density. No suspicious lytic or blastic osseous lesions. No acute displaced fracture. Multilevel degenerative changes of the spine with interval development of compression fractures of the T10, T12, L1 with greater than 90% height loss. IMPRESSION: 1. Cirrhosis with  portal hypertension. No definite focal hepatic lesion; however limited evaluation on this single phase contrast study. Recommend MRI liver protocol for further evaluation. 2. Trace volume right pleural effusion. 3. Possible tiny hiatal hernia. 4. Interval development of T10, T12, L1 compression fractures with greater than 90% height loss. Correlate with point tenderness to palpation to evaluate for acute component. 5. Aortic Atherosclerosis (ICD10-I70.0) including mitral annular calcifications. Electronically Signed   By: Iven Finn M.D.   On: 10/08/2020 19:58   DG CHEST PORT 1 VIEW  Result Date: 10/12/2020 CLINICAL DATA:  Shortness of breath. EXAM: PORTABLE CHEST 1 VIEW COMPARISON:  07/18/2013 FINDINGS: Mild cardiomegaly noted as well as pulmonary vascular congestion. Infiltrate or atelectasis is seen at the right lung base, and small right pleural effusion cannot be excluded. Left lung is clear. Multiple old right rib fracture deformities are noted, as well as right shoulder prosthesis. IMPRESSION: Right basilar atelectasis versus infiltrate, and possible small right pleural effusion. Mild cardiomegaly and pulmonary vascular congestion. Electronically Signed   By: Marlaine Hind M.D.   On: 10/12/2020 08:21   ECHOCARDIOGRAM COMPLETE  Result Date: 10/10/2020    ECHOCARDIOGRAM REPORT  Patient Name:   [name] Date of Exam: 10/10/2020 Medical Rec #:  [id number]         Height:       64.0 in Accession #:    [id number]        Weight:       179.5 lb Date of Birth:  1951-02-04         BSA:          1.868 m Patient Age:    70 years          BP:           174/92 mmHg Patient Gender: F                 HR:           72 bpm. Exam Location:  Inpatient Procedure: 2D Echo, Cardiac Doppler and Color Doppler Indications:    Cardiomyopathy-Unspecified I42.9  History:        Patient has prior history of Echocardiogram examinations, most                 recent 06/14/2015. Risk Factors:Hypertension.  Sonographer:     Bernadene Person RDCS Referring Phys: [id number] ANKIT CHIRAG AMIN IMPRESSIONS  1. Left ventricular ejection fraction, by estimation, is 55 to 60%. The left ventricle has normal function. The left ventricle has no regional wall motion abnormalities. There is moderate asymmetric hypertrophy of the basal septal segment. The rest of the LV segments demonstrate mild left ventricular hypertrophy. Left ventricular diastolic parameters are consistent with Grade I diastolic dysfunction (impaired relaxation). Elevated left atrial pressure.  2. Right ventricular systolic function is normal. The right ventricular size is mildly enlarged. There is severely elevated pulmonary artery systolic pressure. The estimated right ventricular systolic pressure is [id number] mmHg.  3. Left atrial size was severely dilated.  4. Right atrial size was mildly dilated.  5. The mitral valve is abnormal. There are mildly elevated gradients with flow acceleration noted across the mitral valve with mean gradient 23mmHg at HR 77bpm. MVA by continuity 2.7cm2 with no significant mitral stenosis. Mild mitral valve regurgitation.  6. The aortic valve is tricuspid. There is mild calcification of the aortic valve. There is moderate thickening of the aortic valve. The [place] appears fixed with trivial AR. Mild to moderate aortic valve sclerosis/calcification is present, without any evidence of aortic stenosis.  7. The inferior vena cava is normal in size with greater than 50% respiratory variability, suggesting right atrial pressure of 3 mmHg. Comparison(s): Compared to prior TTE in 2016, there is now severe pulmonary HTN present with PASP 73mmHg (previously 22mmHg). FINDINGS  Left Ventricle: Left ventricular ejection fraction, by estimation, is 55 to 60%. The left ventricle has normal function. The left ventricle has no regional wall motion abnormalities. The left ventricular internal cavity size was normal in size. There is  moderate asymmetric hypertrophy of the basal  septal segment. The rest of the LV segments demonstrate mild left ventricular hypertrophy. Left ventricular diastolic parameters are consistent with Grade I diastolic dysfunction (impaired relaxation). Elevated left atrial pressure. Right Ventricle: The right ventricular size is mildly enlarged. No increase in right ventricular wall thickness. Right ventricular systolic function is normal. There is severely elevated pulmonary artery systolic pressure. The tricuspid regurgitant velocity is 3.80 m/s, and with an assumed right atrial pressure of 3 mmHg, the estimated right ventricular systolic pressure is [id number] mmHg. Left Atrium: Left atrial size was severely dilated. Right Atrium: Right atrial size was  mildly dilated. Pericardium: There is no evidence of pericardial effusion. Mitral Valve: There are mildly elevated gradients with flow acceleration noted across the mitral valve with mean gradient 55mmHg at HR 77bpm. MVA by continuity 2.7cm2 with no significant stenosis. The mitral valve is abnormal. There is moderate thickening  of the mitral valve leaflet(s). There is moderate calcification of the mitral valve leaflet(s). Moderate mitral annular calcification. Mild mitral valve regurgitation. No evidence of mitral valve stenosis. Tricuspid Valve: The tricuspid valve is normal in structure. Tricuspid valve regurgitation is mild. Aortic Valve: The aortic valve is tricuspid. There is mild calcification of the aortic valve. There is moderate thickening of the aortic valve. Aortic valve regurgitation LCC is fixed with trivial AR. Mild to moderate aortic valve sclerosis/calcification  is present, without any evidence of aortic stenosis. Pulmonic Valve: The pulmonic valve was normal in structure. Pulmonic valve regurgitation is trivial. Aorta: The aortic root and ascending aorta are structurally normal, with no evidence of dilitation. Venous: The inferior vena cava is normal in size with greater than 50% respiratory  variability, suggesting right atrial pressure of 3 mmHg. IAS/Shunts: No atrial level shunt detected by color flow Doppler.  LEFT VENTRICLE PLAX 2D LVIDd:         4.50 cm  Diastology LVIDs:         3.00 cm  LV e' medial:    5.33 cm/s LV PW:         1.10 cm  LV E/e' medial:  22.7 LV IVS:        1.10 cm  LV e' lateral:   7.80 cm/s LVOT diam:     2.10 cm  LV E/e' lateral: 15.5 LV SV:         103 LV SV Index:   55 LVOT Area:     3.46 cm  RIGHT VENTRICLE RV S prime:     17.40 cm/s TAPSE (M-mode): 2.6 cm LEFT ATRIUM             Index       RIGHT ATRIUM           Index LA diam:        5.60 cm 3.00 cm/m  RA Area:     19.20 cm LA Vol (A2C):   86.0 ml 46.03 ml/m RA Volume:   54.70 ml  29.28 ml/m LA Vol (A4C):   90.6 ml 48.49 ml/m LA Biplane Vol: 88.2 ml 47.21 ml/m  AORTIC VALVE LVOT Vmax:   119.00 cm/s LVOT Vmean:  78.800 cm/s LVOT VTI:    0.296 m  AORTA Ao Root diam: 3.00 cm Ao Asc diam:  3.30 cm MITRAL VALVE                TRICUSPID VALVE MV Area (PHT): 5.66 cm     TR Peak grad:   57.8 mmHg MV Decel Time: 134 msec     TR Vmax:        380.00 cm/s MV E velocity: 121.00 cm/s MV Vickie Melnik velocity: 144.00 cm/s  SHUNTS MV E/Brooksie Ellwanger ratio:  0.84         Systemic VTI:  0.30 m                             Systemic Diam: 2.10 cm Gwyndolyn Kaufman MD Electronically signed by Gwyndolyn Kaufman MD Signature Date/Time: 10/10/2020/12:13:50 PM    Final    VAS [place] LOWER EXTREMITY VENOUS (DVT)  Result Date:  10/12/2020  Lower Venous DVT Study Indications: Atrial fibrillation.  Limitations: Poor ultrasound/tissue interface. Comparison Study: No prior studies. Performing Technologist: Darlin Coco RDMS,RVT  Examination Guidelines: Maelie Chriswell complete evaluation includes B-mode imaging, spectral Doppler, color Doppler, and power Doppler as needed of all accessible portions of each vessel. Bilateral testing is considered an integral part of Niyam Bisping complete examination. Limited examinations for reoccurring indications may be performed as noted. The reflux portion of  the exam is performed with the patient in reverse Trendelenburg.  +---------+---------------+---------+-----------+----------+-------------------+ RIGHT    CompressibilityPhasicitySpontaneityPropertiesThrombus Aging      +---------+---------------+---------+-----------+----------+-------------------+ CFV      Full           Yes      Yes                                      +---------+---------------+---------+-----------+----------+-------------------+ SFJ      Full                                                             +---------+---------------+---------+-----------+----------+-------------------+ FV Prox  Full                                                             +---------+---------------+---------+-----------+----------+-------------------+ FV Mid   Full                                                             +---------+---------------+---------+-----------+----------+-------------------+ FV DistalFull                                                             +---------+---------------+---------+-----------+----------+-------------------+ PFV      Full                                                             +---------+---------------+---------+-----------+----------+-------------------+ POP      Full           Yes      Yes                                      +---------+---------------+---------+-----------+----------+-------------------+ PTV      Full                                                             +---------+---------------+---------+-----------+----------+-------------------+  PERO                                                  Not well visualized +---------+---------------+---------+-----------+----------+-------------------+   +---------+---------------+---------+-----------+----------+-------------------+ LEFT     CompressibilityPhasicitySpontaneityPropertiesThrombus Aging       +---------+---------------+---------+-----------+----------+-------------------+ CFV      Full           Yes      Yes                                      +---------+---------------+---------+-----------+----------+-------------------+ SFJ      Full                                                             +---------+---------------+---------+-----------+----------+-------------------+ FV Prox  Full                                                             +---------+---------------+---------+-----------+----------+-------------------+ FV Mid   Full                                                             +---------+---------------+---------+-----------+----------+-------------------+ FV DistalFull                                                             +---------+---------------+---------+-----------+----------+-------------------+ PFV      Full                                                             +---------+---------------+---------+-----------+----------+-------------------+ POP      Full           Yes      Yes                                      +---------+---------------+---------+-----------+----------+-------------------+ PTV      Full                                                             +---------+---------------+---------+-----------+----------+-------------------+ PERO  Not well visualized +---------+---------------+---------+-----------+----------+-------------------+     Summary: RIGHT: - There is no evidence of deep vein thrombosis in the lower extremity. However, portions of this examination were limited- see technologist comments above.  - No cystic structure found in the popliteal fossa.  LEFT: - There is no evidence of deep vein thrombosis in the lower extremity. However, portions of this examination were limited- see technologist comments above.  - No cystic  structure found in the popliteal fossa.  *See table(s) above for measurements and observations. Electronically signed by Jamelle Haring on 10/12/2020 at 4:31:33 PM.    Final    US Abdomen Limited RUQ (LIVER/GB)  Result Date: 10/09/2020 CLINICAL DATA:  Transaminitis EXAM: ULTRASOUND ABDOMEN LIMITED RIGHT UPPER QUADRANT COMPARISON:  CT abdomen pelvis 10/08/2020 FINDINGS: Gallbladder: No gallstones or wall thickening visualized. No sonographic Murphy sign noted by sonographer. Common bile duct: Diameter: 8 mm Liver: Nodular hepatic contour suspicious for cirrhosis. No focal hepatic lesion. Portal vein is patent on color Doppler imaging with normal direction of blood flow towards the liver. Other: None. IMPRESSION: Cirrhotic liver morphology without focal hepatic lesion. Electronically Signed   By: Miachel Roux M.D.   On: 10/09/2020 09:06    Microbiology: Recent Results (from the past 240 hour(s))  Resp Panel by RT-PCR (Flu Lacresia Darwish&B, Covid) Nasopharyngeal Swab     Status: None   Collection Time: 10/08/20  5:33 PM   Specimen: Nasopharyngeal Swab; Nasopharyngeal(NP) swabs in vial transport medium  Result Value Ref Range Status   SARS Coronavirus 2 by RT PCR NEGATIVE NEGATIVE Final    Comment: (NOTE) SARS-CoV-2 target nucleic acids are NOT DETECTED.  The SARS-CoV-2 RNA is generally detectable in upper respiratory specimens during the acute phase of infection. The lowest concentration of SARS-CoV-2 viral copies this assay can detect is 138 copies/mL. Corynne Scibilia negative result does not preclude SARS-Cov-2 infection and should not be used as the sole basis for treatment or other patient management decisions. Dhyan Noah negative result may occur with  improper specimen collection/handling, submission of specimen other than nasopharyngeal swab, presence of viral mutation(s) within the areas targeted by this assay, and inadequate number of viral copies(<138 copies/mL). Albaraa Swingle negative result must be combined with clinical  observations, patient history, and epidemiological information. The expected result is Negative.  Fact Sheet for Patients:  EntrepreneurPulse.com.au  Fact Sheet for Healthcare Providers:  IncredibleEmployment.be  This test is no t yet approved or cleared by the [place] FDA and  has been authorized for detection and/or diagnosis of SARS-CoV-2 by FDA under an Emergency Use Authorization (EUA). This EUA will remain  in effect (meaning this test can be used) for the duration of the COVID-19 declaration under Section 564(b)(1) of the Act, 21 U.S.C.section 360bbb-3(b)(1), unless the authorization is terminated  or revoked sooner.       Influenza Reann Dobias by PCR NEGATIVE NEGATIVE Final   Influenza B by PCR NEGATIVE NEGATIVE Final    Comment: (NOTE) The Xpert Xpress SARS-CoV-2/FLU/RSV plus assay is intended as an aid in the diagnosis of influenza from Nasopharyngeal swab specimens and should not be used as Loni Abdon sole basis for treatment. Nasal washings and aspirates are unacceptable for Xpert Xpress SARS-CoV-2/FLU/RSV testing.  Fact Sheet for Patients: EntrepreneurPulse.com.au  Fact Sheet for Healthcare Providers: IncredibleEmployment.be  This test is not yet approved or cleared by the [place] FDA and has been authorized for detection and/or diagnosis of SARS-CoV-2 by FDA under an Emergency Use Authorization (EUA). This EUA will remain in effect (meaning this test  can be used) for the duration of the COVID-19 declaration under Section 564(b)(1) of the Act, 21 U.S.C. section 360bbb-3(b)(1), unless the authorization is terminated or revoked.  Performed at [facility], [place] [address] [place], [place] [address]      Labs: Basic Metabolic Panel: Recent Labs  Lab 10/13/20 0153 10/14/20 0336 10/15/20 6761 10/16/20 0213 10/17/20 0642 10/18/20 0332 10/18/20 1148  NA 132* 130* 129* 128* 128* 128*  129*  K 4.4 4.1 4.0 4.2 3.8 3.6 3.4*  CL 88* 87* 88* 86* 87* 92* 93*  CO2 31 37* 36* 35* 34* 30 27  GLUCOSE 105* 99 93 92 79 76 84  BUN 16 20 15 15  7* 5* 6*  CREATININE 0.62 0.63 0.62 0.61 0.55 0.56 0.52  CALCIUM 9.5 9.1 9.0 9.1 8.7* 8.5* 8.6*  MG 1.6* 1.9  --  1.8 1.6* 2.2  --   PHOS  --  3.3  --  3.6 3.9 4.0  --    Liver Function Tests: Recent Labs  Lab 10/13/20 0153 10/14/20 0336 10/16/20 0213 10/17/20 0642 10/18/20 0332  AST 24 22 26 23 20   ALT 14 17 25 25 21   ALKPHOS 77 75 80 76 77  BILITOT 0.9 1.0 0.8 0.8 1.2  PROT 5.7* 5.5* 5.8* 5.2* 5.3*  ALBUMIN 3.2* 3.1* 3.2* 3.0* 2.9*   No results for input(s): LIPASE, AMYLASE in the last 168 hours. Recent Labs  Lab 10/11/20 2302 10/12/20 1501  AMMONIA 39* 25   CBC: Recent Labs  Lab 10/12/20 0802 10/13/20 0153 10/14/20 0336 10/15/20 0324 10/16/20 0213 10/16/20 0908 10/17/20 0642 10/18/20 0332  WBC 8.8   < > 7.2 8.4 9.8 10.5 8.0 8.5  NEUTROABS 8.2*  --  6.0  --  8.3*  --  6.2 7.0  HGB 12.3   < > 11.6* 11.7* 11.2* 12.1 11.8* 12.1  HCT 33.4*   < > 31.5* 31.5* RESULTS UNAVAILABLE DUE TO INTERFERING SUBSTANCE RESULTS UNAVAILABLE DUE TO INTERFERING SUBSTANCE RESULTS UNAVAILABLE DUE TO INTERFERING SUBSTANCE 32.3*  MCV 89.5   < > 90.0 90.0 RESULTS UNAVAILABLE DUE TO INTERFERING SUBSTANCE RESULTS UNAVAILABLE DUE TO INTERFERING SUBSTANCE RESULTS UNAVAILABLE DUE TO INTERFERING SUBSTANCE 88.3  PLT 234   < > 203 190 125* 182 153 152   < > = values in this interval not displayed.   Cardiac Enzymes: No results for input(s): CKTOTAL, CKMB, CKMBINDEX, TROPONINI in the last 168 hours. BNP: BNP (last 3 results) Recent Labs    10/11/20 2305 10/12/20 0802  BNP 949.4* 902.7*    ProBNP (last 3 results) No results for input(s): PROBNP in the last 8760 hours.  CBG: Recent Labs  Lab 10/13/20 0610 10/13/20 1118 10/13/20 1602 10/14/20 0607 10/14/20 1147  GLUCAP 100* 130* 150* 105* 123*       Signed:  Fayrene Helper  MD.  [facility] 10/18/2020, 2:46 PM

## 2020-10-18 NOTE — Care Management Important Message (Signed)
Important Message  Patient Details  Name: [name] MRN: [id number] Date of Birth: 1950/07/22   Medicare Important Message Given:  Yes     Shelda Altes 10/18/2020, 8:27 AM

## 2020-10-18 NOTE — Progress Notes (Signed)
D/C instructions given and reviewed. Tele and IV's removed, tolerated well. Awaiting family to arrive.

## 2020-10-18 NOTE — Progress Notes (Signed)
Progress Note  Patient Name: [name] Date of Encounter: 10/18/2020  [facility] Cardiologist: Elouise Munroe, MD   Subjective   Back pain is better today -had kyphoplasty yesterday. Remains in sinus, now on po cardizem. BP elevated.   Inpatient Medications    Scheduled Meds: . cholecalciferol  1,000 Units Oral Daily  . citalopram  20 mg Oral Daily  . diclofenac Sodium  2 g Topical QID  . diltiazem  120 mg Oral Daily  . docusate sodium  100 mg Oral Daily  . feeding supplement  237 mL Oral BID BM  . hydrALAZINE  50 mg Oral TID  . levothyroxine  100 mcg Oral Q0600  . lisinopril  40 mg Oral Daily  . magnesium oxide  400 mg Oral Daily  . omega-3 acid ethyl esters  1 g Oral Daily  . pantoprazole  40 mg Oral QHS   Continuous Infusions: . sodium chloride 125 mL/hr at 10/18/20 0844   PRN Meds: acetaminophen, dextromethorphan-guaiFENesin, iohexol, ipratropium-albuterol, labetalol, metoprolol tartrate, oxyCODONE, polyethylene glycol, senna-docusate   Vital Signs    Vitals:   10/17/20 1506 10/17/20 2012 10/18/20 0455 10/18/20 0845  BP: (!) 160/80 (!) 143/60 (!) 148/73 (!) 169/79  Pulse: 67 71 66 65  Resp: (!) 21 18 20 18   Temp:  98.3 F (36.8 C) 98.3 F (36.8 C)   TempSrc:  Oral Oral   SpO2: 100% 92% 95% 95%  Weight:   79.3 kg   Height:        Intake/Output Summary (Last 24 hours) at 10/18/2020 1019 Last data filed at 10/18/2020 0851 Gross per 24 hour  Intake 772.46 ml  Output 1500 ml  Net -727.54 ml   Last 3 Weights 10/18/2020 10/17/2020 10/16/2020  Weight (lbs) 174 lb 13.2 oz 181 lb 10.5 oz 182 lb 12.2 oz  Weight (kg) 79.3 kg 82.4 kg 82.9 kg      Telemetry    Sinus rhythm in the 60's - Personally Reviewed  Physical Exam   GEN: No acute distress.   Neck: No JVD Cardiac: regular rate and rhythm Respiratory: Clear to auscultation bilaterally. GI: Soft, nontender, non-distended  MS: No edema Neuro:  Nonfocal  Psych: Normal affect   Labs      Chemistry Recent Labs  Lab 10/16/20 0213 10/17/20 0642 10/18/20 0332  NA 128* 128* 128*  K 4.2 3.8 3.6  CL 86* 87* 92*  CO2 35* 34* 30  GLUCOSE 92 79 76  BUN 15 7* 5*  CREATININE 0.61 0.55 0.56  CALCIUM 9.1 8.7* 8.5*  PROT 5.8* 5.2* 5.3*  ALBUMIN 3.2* 3.0* 2.9*  AST 26 23 20   ALT 25 25 21   ALKPHOS 80 76 77  BILITOT 0.8 0.8 1.2  GFRNONAA >60 >60 >60  ANIONGAP 7 7 6      Hematology Recent Labs  Lab 10/16/20 0908 10/17/20 0642 10/18/20 0332  WBC 10.5 8.0 8.5  RBC RESULTS UNAVAILABLE DUE TO INTERFERING SUBSTANCE RESULTS UNAVAILABLE DUE TO INTERFERING SUBSTANCE 3.66*  HGB 12.1 11.8* 12.1  HCT RESULTS UNAVAILABLE DUE TO INTERFERING SUBSTANCE RESULTS UNAVAILABLE DUE TO INTERFERING SUBSTANCE 32.3*  MCV RESULTS UNAVAILABLE DUE TO INTERFERING SUBSTANCE RESULTS UNAVAILABLE DUE TO INTERFERING SUBSTANCE 88.3  MCH RESULTS UNAVAILABLE DUE TO INTERFERING SUBSTANCE RESULTS UNAVAILABLE DUE TO INTERFERING SUBSTANCE 33.1  MCHC RESULTS UNAVAILABLE DUE TO INTERFERING SUBSTANCE RESULTS UNAVAILABLE DUE TO INTERFERING SUBSTANCE 37.5*  RDW RESULTS UNAVAILABLE DUE TO INTERFERING SUBSTANCE RESULTS UNAVAILABLE DUE TO INTERFERING SUBSTANCE 13.0  PLT 182 153 152  BNP Recent Labs  Lab 10/11/20 2305 10/12/20 0802  BNP 949.4* 902.7*     Radiology    No results found.  Patient Profile     70 y.o. female with a hx of HTN, GERD, hypothyroid, liver cirrhosis, + tobacco use, COPD, admitted 10/08/20 with back and abd pain along with hypokalemia,(K+ 2.5, Mg+ 1.1) hypomg+ hyponatremiaand new diagnosis of atrial fib RVR.  Patient found to have T10, T8, T12 and L1 compression fracture.  Interventional radiology consulted who recommended kyphoplasty.  Echocardiogram this admission shows ejection fraction 55 to 35%, grade 1 diastolic dysfunction, mild right ventricular enlargement, severe pulmonary hypertension, severe left atrial enlargement, mild right atrial enlargement, mild mitral regurgitation.   TSH 0.241.  Free T4 1.48.  Assessment & Plan    1 persistent atrial fibrillation-is in sinus rhythm today-switch to po LA diltiazem. Not thought to be a good long-term [place] candidate. Would continue current therapy.  2 vertebral fracture-kyphoplasty yesterday, back pain is improved.  3 pulmonary hypertension-felt to be multifactorial including COPD, possible obstructive sleep apnea and cirrhosis.  4 hypertension-blood pressure remains elevated.  Continue present medications and follow. Would increase hydralazine to 75 mg po TID.  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  6 cirrhosis -  follow-up GI as an outpatient.  [facility] will sign off.   Medication Recommendations:  As above Other recommendations (labs, testing, etc):  none Follow up as an outpatient:  Dr. Margaretann Loveless  For questions or updates, please contact [place] Please consult [email or web address] for contact info under   Pixie Casino, MD, FACC, [place] Director of the [facility]  [facility] Diplomate of the American Board of Clinical Lipidology Attending Cardiologist  Direct Dial: [phone]  Fax: [phone]  Website:  [email or web address]  Pixie Casino, MD  10/18/2020, 10:19 AM

## 2020-10-18 NOTE — Plan of Care (Signed)
  Problem: Education: Goal: Knowledge of General Education information will improve Description: Including pain rating scale, medication(s)/side effects and non-pharmacologic comfort measures Outcome: Progressing   Problem: Health Behavior/Discharge Planning: Goal: Ability to manage health-related needs will improve Outcome: Progressing   Problem: Clinical Measurements: Goal: Ability to maintain clinical measurements within normal limits will improve Outcome: Progressing Goal: Will remain free from infection Outcome: Progressing Goal: Cardiovascular complication will be avoided Outcome: Progressing   Problem: Coping: Goal: Level of anxiety will decrease Outcome: Progressing   Problem: Elimination: Goal: Will not experience complications related to urinary retention Outcome: Progressing   Problem: Pain Managment: Goal: General experience of comfort will improve Outcome: Progressing   Problem: Safety: Goal: Ability to remain free from injury will improve Outcome: Progressing   Problem: Skin Integrity: Goal: Risk for impaired skin integrity will decrease Outcome: Progressing   Problem: Activity: Goal: Risk for activity intolerance will decrease Outcome: Not Progressing

## 2020-10-18 NOTE — TOC Transition Note (Signed)
Transition of Care [facility]) - CM/SW Discharge Note   Patient Details  Name: [name] MRN: [id number] Date of Birth: [birth year]  [facility]) CM/SW Contact:  Zenon Mayo, RN Phone Number: 10/18/2020, 3:01 PM   Clinical Narrative:    Patient is for dc today, NCM offered choice, she had daughter on the phone and she states she has no preference.  NCM made referral to [facility] with [facility], she is able to take referral.  Soc will begin 24 to 48 hrs post dc.  [facility] will supply the 3 n 1.     Final next level of care: [place] Barriers to Discharge: No Barriers Identified   Patient Goals and CMS Choice Patient states their goals for this hospitalization and ongoing recovery are:: home with [facility] CMS Medicare.gov Compare Post Acute Care list provided to:: Patient Represenative (must comment) Choice offered to / list presented to : Adult Children  Discharge Placement                       Discharge Plan and Services                DME Arranged: 3-N-1 DME Agency: [facility] Date DME Agency Contacted: 10/18/20 Time DME Agency Contacted: 1500 Representative spoke with at DME Agency: [place]: PT,OT [place] Agency: [place] Date [place]: 10/18/20 Time [place]: 1501 Representative spoke with at [place]: [place] Determinants of Health ([place]) Interventions     Readmission Risk Interventions No flowsheet data found.

## 2020-10-18 NOTE — TOC Progression Note (Addendum)
Transition of Care [facility]) - Progression Note    Patient Details  Name: [name] MRN: [id number] Date of Birth: 1950/09/23  [facility] CM/SW Contact  Zenon Mayo, RN Phone Number: 10/18/2020, 2:38 PM  Clinical Narrative:    NCM spoke with patient, offered choice, she has no preference, she will need 3 n 1 also , she is ok with [facility] supplying the 3 n1 . NCM made referral to [facility] with [facility] , she can take referral.        Expected Discharge Plan and Services           Expected Discharge Date: 10/18/20                                     Social Determinants of Health (SDOH) Interventions    Readmission Risk Interventions No flowsheet data found.

## 2020-10-22 ENCOUNTER — Encounter (HOSPITAL_COMMUNITY): Payer: Self-pay

## 2020-10-22 ENCOUNTER — Emergency Department (HOSPITAL_COMMUNITY): Payer: Medicare Other

## 2020-10-22 ENCOUNTER — Inpatient Hospital Stay (HOSPITAL_COMMUNITY)
Admission: EM | Admit: 2020-10-22 | Discharge: 2020-10-27 | DRG: 070 | Disposition: A | Discharge status: Skilled Nursing Facility | Payer: Medicare Other | Attending: Internal Medicine | Admitting: Internal Medicine

## 2020-10-22 ENCOUNTER — Other Ambulatory Visit: Payer: Self-pay

## 2020-10-22 DIAGNOSIS — Z8249 Family history of ischemic heart disease and other diseases of the circulatory system: Secondary | ICD-10-CM

## 2020-10-22 DIAGNOSIS — Z9049 Acquired absence of other specified parts of digestive tract: Secondary | ICD-10-CM

## 2020-10-22 DIAGNOSIS — Z20822 Contact with and (suspected) exposure to covid-19: Secondary | ICD-10-CM | POA: Diagnosis present

## 2020-10-22 DIAGNOSIS — R404 Transient alteration of awareness: Secondary | ICD-10-CM

## 2020-10-22 DIAGNOSIS — R0602 Shortness of breath: Secondary | ICD-10-CM

## 2020-10-22 DIAGNOSIS — G9340 Encephalopathy, unspecified: Secondary | ICD-10-CM | POA: Diagnosis not present

## 2020-10-22 DIAGNOSIS — Z79899 Other long term (current) drug therapy: Secondary | ICD-10-CM

## 2020-10-22 DIAGNOSIS — R680 Hypothermia, not associated with low environmental temperature: Secondary | ICD-10-CM | POA: Diagnosis present

## 2020-10-22 DIAGNOSIS — Z9851 Tubal ligation status: Secondary | ICD-10-CM

## 2020-10-22 DIAGNOSIS — K766 Portal hypertension: Secondary | ICD-10-CM | POA: Diagnosis present

## 2020-10-22 DIAGNOSIS — E871 Hypo-osmolality and hyponatremia: Secondary | ICD-10-CM | POA: Diagnosis present

## 2020-10-22 DIAGNOSIS — Z66 Do not resuscitate: Secondary | ICD-10-CM | POA: Diagnosis present

## 2020-10-22 DIAGNOSIS — I4821 Permanent atrial fibrillation: Secondary | ICD-10-CM | POA: Diagnosis present

## 2020-10-22 DIAGNOSIS — F17210 Nicotine dependence, cigarettes, uncomplicated: Secondary | ICD-10-CM | POA: Diagnosis present

## 2020-10-22 DIAGNOSIS — E876 Hypokalemia: Secondary | ICD-10-CM | POA: Diagnosis present

## 2020-10-22 DIAGNOSIS — I110 Hypertensive heart disease with heart failure: Secondary | ICD-10-CM | POA: Diagnosis present

## 2020-10-22 DIAGNOSIS — I5032 Chronic diastolic (congestive) heart failure: Secondary | ICD-10-CM | POA: Diagnosis present

## 2020-10-22 DIAGNOSIS — M4854XA Collapsed vertebra, not elsewhere classified, thoracic region, initial encounter for fracture: Secondary | ICD-10-CM | POA: Diagnosis present

## 2020-10-22 DIAGNOSIS — Z923 Personal history of irradiation: Secondary | ICD-10-CM

## 2020-10-22 DIAGNOSIS — Z79890 Hormone replacement therapy (postmenopausal): Secondary | ICD-10-CM

## 2020-10-22 DIAGNOSIS — E039 Hypothyroidism, unspecified: Secondary | ICD-10-CM | POA: Diagnosis present

## 2020-10-22 DIAGNOSIS — K7030 Alcoholic cirrhosis of liver without ascites: Secondary | ICD-10-CM | POA: Diagnosis present

## 2020-10-22 DIAGNOSIS — I10 Essential (primary) hypertension: Secondary | ICD-10-CM | POA: Diagnosis present

## 2020-10-22 DIAGNOSIS — J189 Pneumonia, unspecified organism: Secondary | ICD-10-CM | POA: Diagnosis present

## 2020-10-22 LAB — COMPREHENSIVE METABOLIC PANEL
ALT: 18 U/L (ref 0–44)
AST: 24 U/L (ref 15–41)
Albumin: 3.4 g/dL — ABNORMAL LOW (ref 3.5–5.0)
Alkaline Phosphatase: 83 U/L (ref 38–126)
Anion gap: 12 (ref 5–15)
BUN: 14 mg/dL (ref 8–23)
CO2: 26 mmol/L (ref 22–32)
Calcium: 9.2 mg/dL (ref 8.9–10.3)
Chloride: 92 mmol/L — ABNORMAL LOW (ref 98–111)
Creatinine, Ser: 0.59 mg/dL (ref 0.44–1.00)
GFR, Estimated: >60 mL/min (ref >60)
Glucose, Bld: 79 mg/dL (ref 70–99)
Potassium: 2.8 mmol/L — ABNORMAL LOW (ref 3.5–5.1)
Sodium: 130 mmol/L — ABNORMAL LOW (ref 135–145)
Total Bilirubin: 1.2 mg/dL (ref 0.3–1.2)
Total Protein: 6 g/dL — ABNORMAL LOW (ref 6.5–8.1)

## 2020-10-22 LAB — BLOOD GAS, VENOUS
Acid-Base Excess: 3 mmol/L — ABNORMAL HIGH (ref 0.0–2.0)
Bicarbonate: 27.9 mmol/L (ref 20.0–28.0)
O2 Saturation: 84.5 %
Patient temperature: 98.6
pCO2, Ven: 46.2 mmHg (ref 44.0–60.0)
pH, Ven: 7.398 (ref 7.250–7.430)
pO2, Ven: 53.2 mmHg — ABNORMAL HIGH (ref 32.0–45.0)

## 2020-10-22 LAB — URINALYSIS, ROUTINE W REFLEX MICROSCOPIC
Bacteria, UA: NONE SEEN
Bilirubin Urine: NEGATIVE
Glucose, UA: NEGATIVE mg/dL
Hgb urine dipstick: NEGATIVE
Ketones, ur: 5 mg/dL — AB
Leukocytes,Ua: NEGATIVE
Nitrite: NEGATIVE
Protein, ur: 30 mg/dL — AB
Specific Gravity, Urine: 1.021 (ref 1.005–1.030)
pH: 5 (ref 5.0–8.0)

## 2020-10-22 LAB — I-STAT CHEM 8, ED
BUN: 12 mg/dL (ref 8–23)
Calcium, Ion: 1.18 mmol/L (ref 1.15–1.40)
Chloride: 88 mmol/L — ABNORMAL LOW (ref 98–111)
Creatinine, Ser: 0.5 mg/dL (ref 0.44–1.00)
Glucose, Bld: 77 mg/dL (ref 70–99)
HCT: 37 % (ref 36.0–46.0)
Hemoglobin: 12.6 g/dL (ref 12.0–15.0)
Potassium: 2.8 mmol/L — ABNORMAL LOW (ref 3.5–5.1)
Sodium: 127 mmol/L — ABNORMAL LOW (ref 135–145)
TCO2: 25 mmol/L (ref 22–32)

## 2020-10-22 LAB — MAGNESIUM: Magnesium: 1.7 mg/dL (ref 1.7–2.4)

## 2020-10-22 LAB — CBG MONITORING, ED: Glucose-Capillary: 87 mg/dL (ref 70–99)

## 2020-10-22 LAB — LACTIC ACID, PLASMA: Lactic Acid, Venous: 0.7 mmol/L (ref 0.5–1.9)

## 2020-10-22 LAB — AMMONIA: Ammonia: 19 umol/L (ref 9–35)

## 2020-10-22 LAB — ETHANOL: Alcohol, Ethyl (B): <10 mg/dL (ref <10)

## 2020-10-22 IMAGING — DX DG CHEST 1V PORT
1 series · 1 of 1 positions shown · non-contrast
Comparison: [DATE]

CLINICAL DATA: Lethargic.

EXAM:
PORTABLE CHEST 1 VIEW

[chest ap]
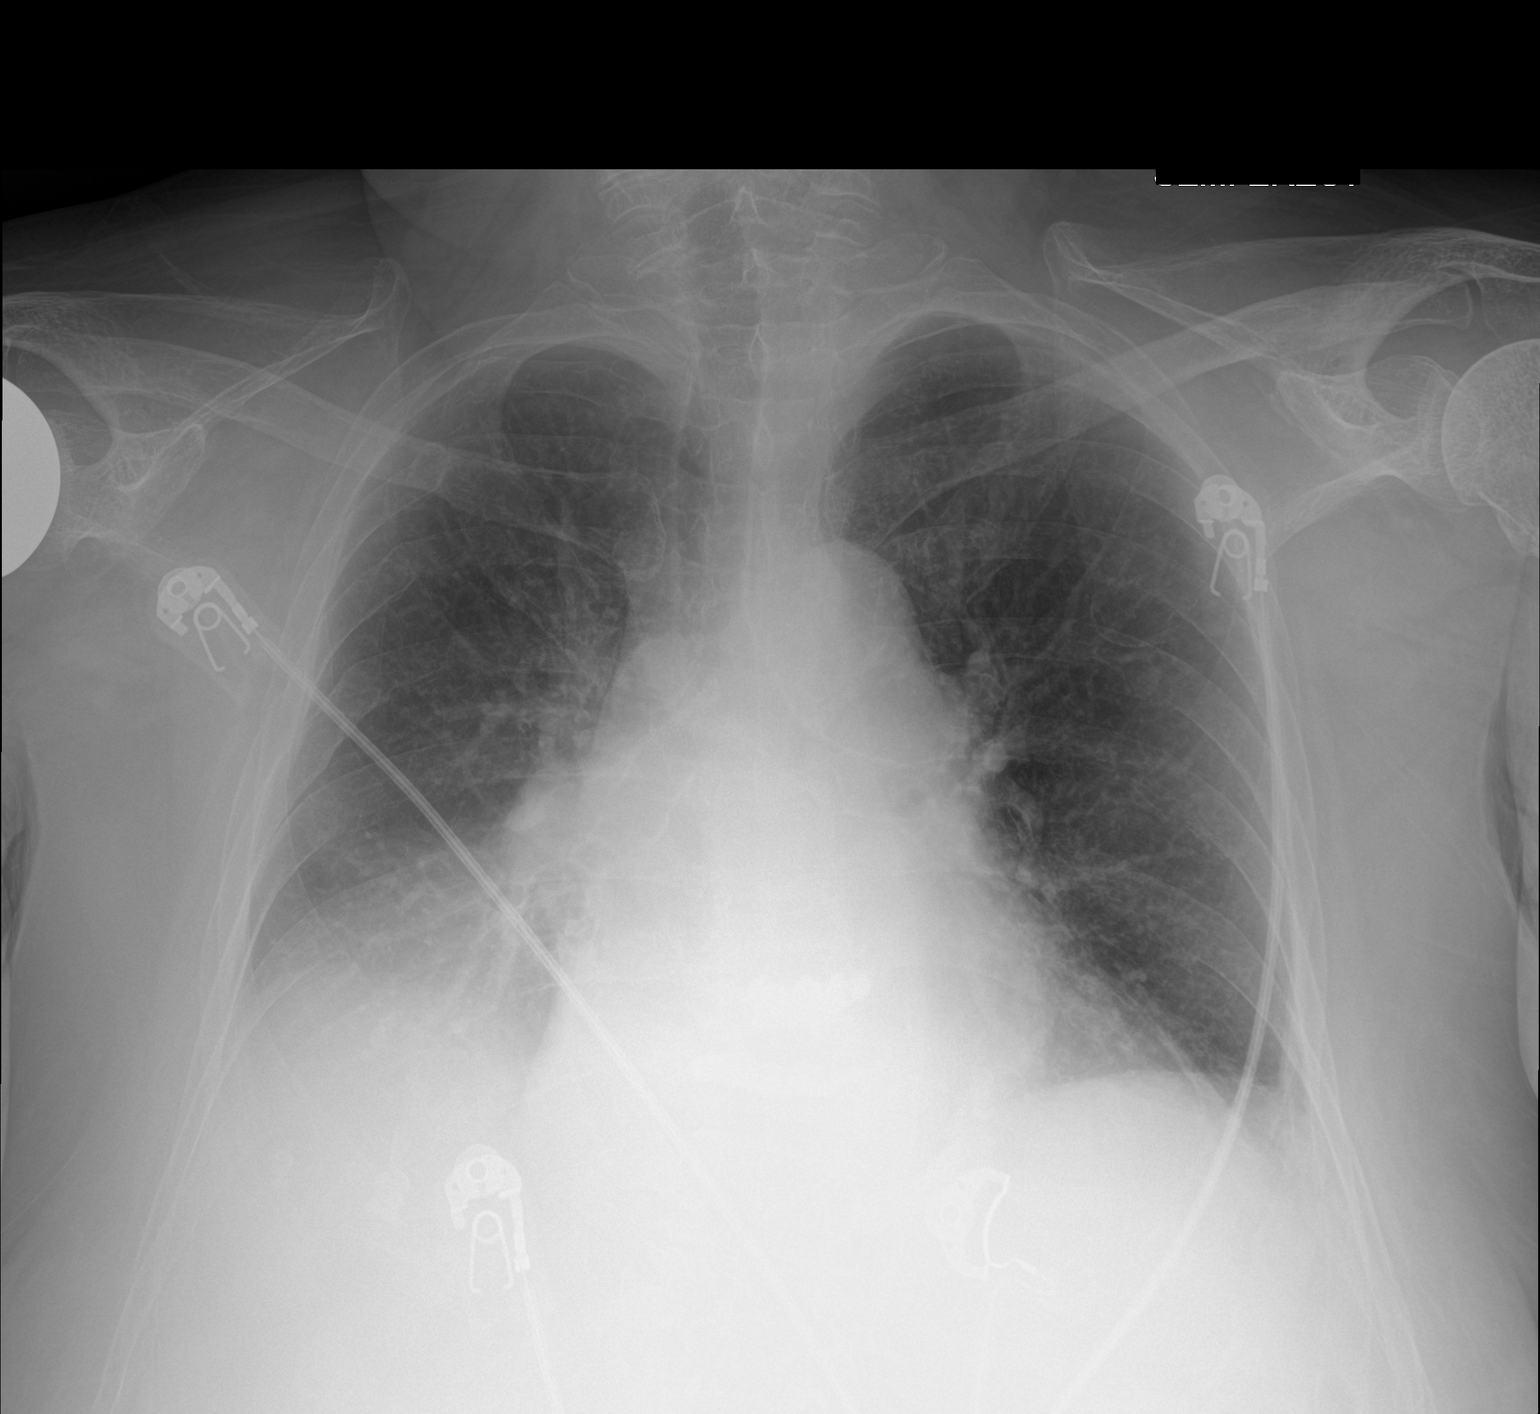

[1 of 1 positions shown; findings below may reference images not displayed]

FINDINGS: Chronic appearing increased lung markings are seen without evidence
of acute infiltrate, pleural effusion or pneumothorax. The cardiac
silhouette is mildly enlarged. Multiple chronic right-sided rib
fractures are seen. A radiopaque right shoulder replacement is
noted. There is evidence of prior vertebroplasty within the lower
thoracic spine.
IMPRESSION: Stable cardiomegaly and chronic changes without acute
cardiopulmonary disease.

## 2020-10-22 IMAGING — CT CT ABD-PELV W/ CM
2 of 5 series · 16 of 46 positions shown, 18 images · IV contrast (omnipaque)
Comparison: [DATE]

CLINICAL DATA: Lethargy.

EXAM:
CT ABDOMEN AND PELVIS WITH CONTRAST
TECHNIQUE: Multidetector CT imaging of the abdomen and pelvis was performed
using the standard protocol following bolus administration of
intravenous contrast.
CONTRAST:  100mL OMNIPAQUE IOHEXOL 300 MG/ML  SOLN

[Series 3: axial st · axial · 0.84mm/px · z∈[-738,-348]mm · 13 of 91 slices shown, 15 images]
[im 7/91  soft-tissue]
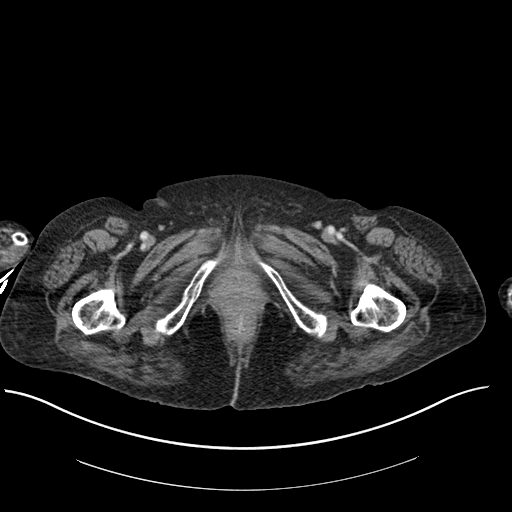
[im 7/91  bone]
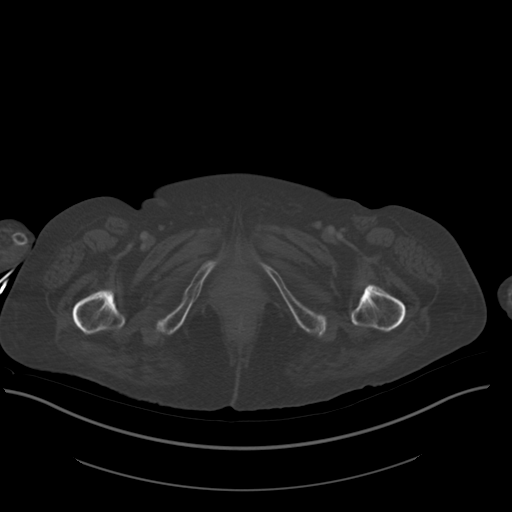
[im 13/91  soft-tissue]
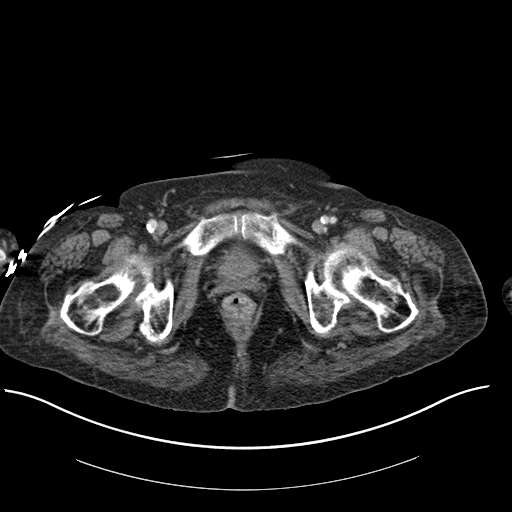
[im 19/91  soft-tissue]
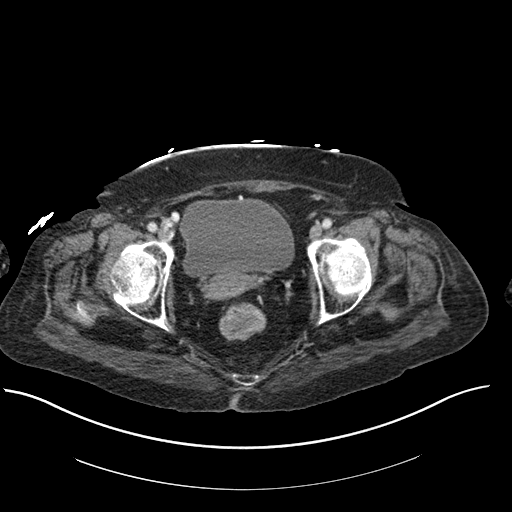
[im 25/91  soft-tissue]
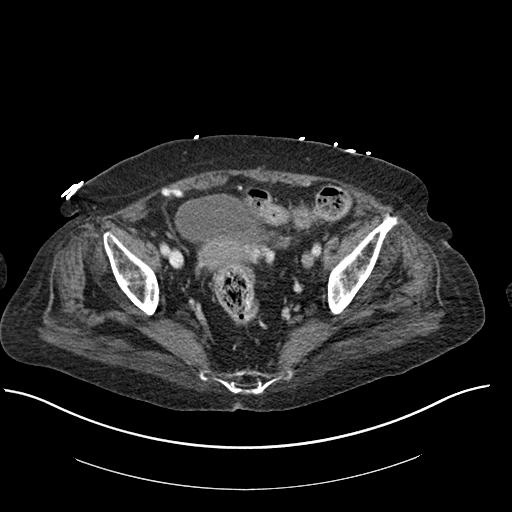
[im 31/91  soft-tissue]
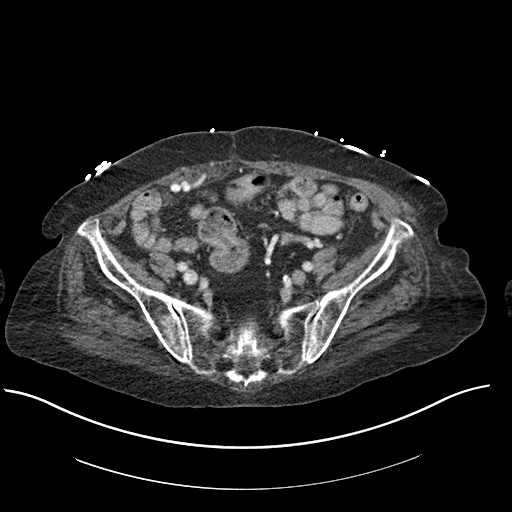
[im 37/91  soft-tissue]
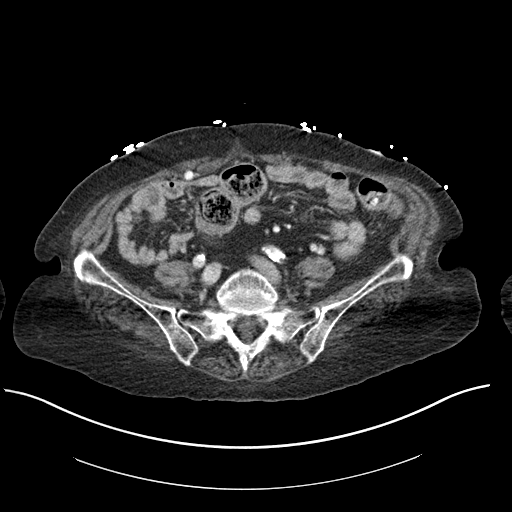
[im 49/91  soft-tissue]
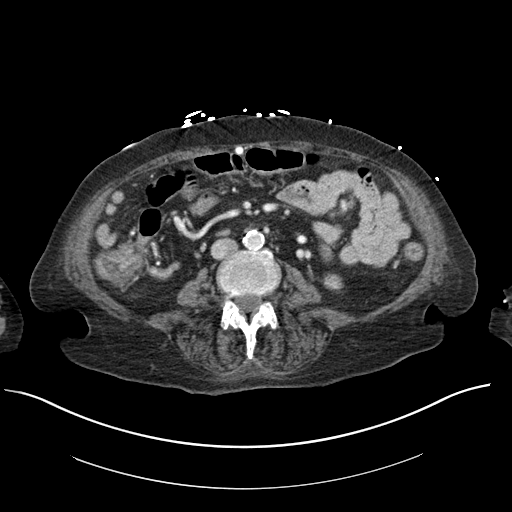
[im 55/91  soft-tissue]
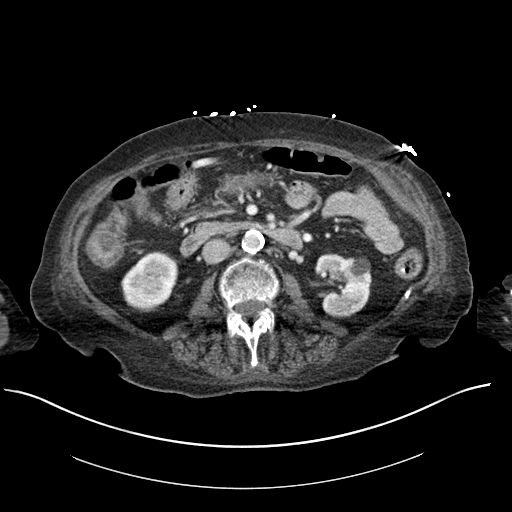
[im 61/91  soft-tissue]
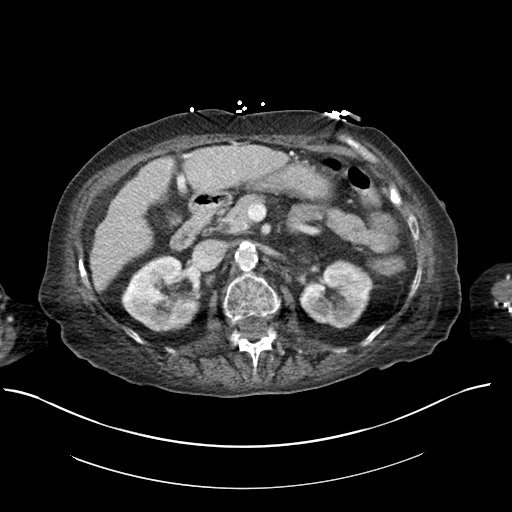
[im 61/91  bone]
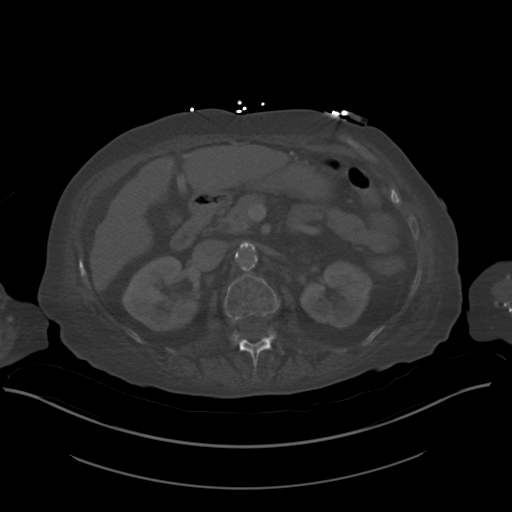
[im 67/91  soft-tissue]
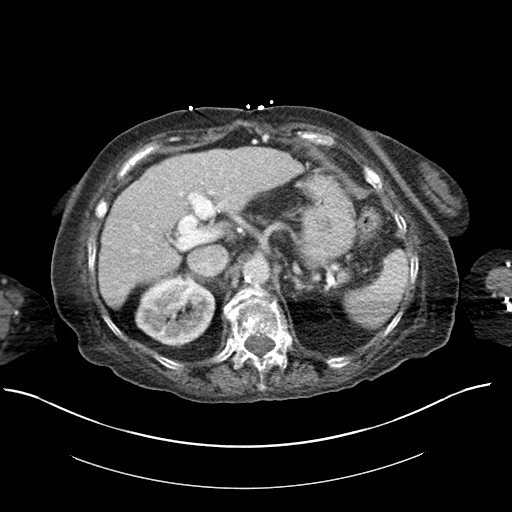
[im 73/91  soft-tissue]
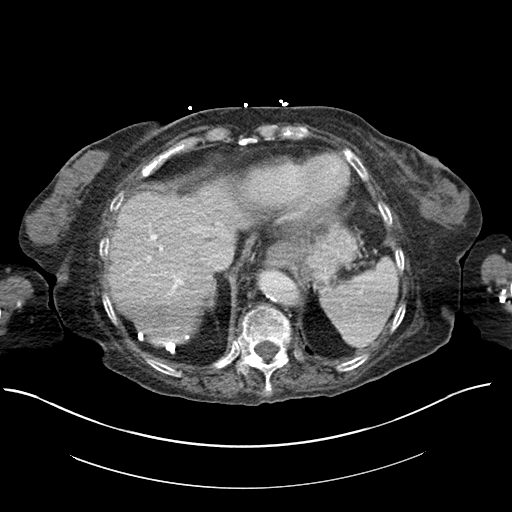
[im 79/91  soft-tissue]
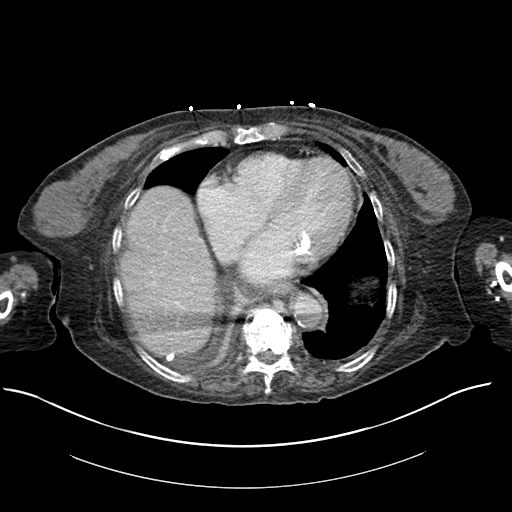
[im 85/91  soft-tissue]
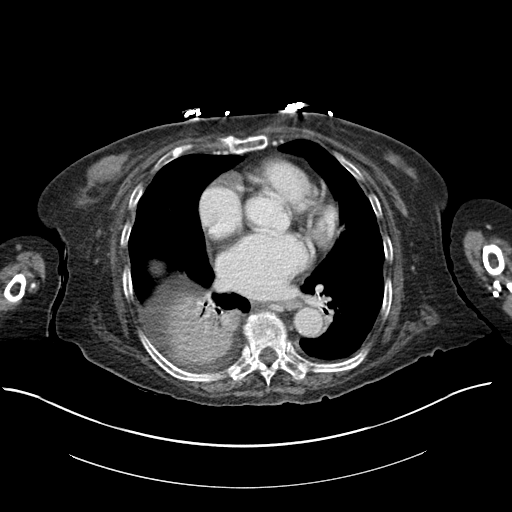

[Series 6: coronal st · coronal · 0.79mm/px · 3 of 147 slices shown]
[im 49/147  soft-tissue]
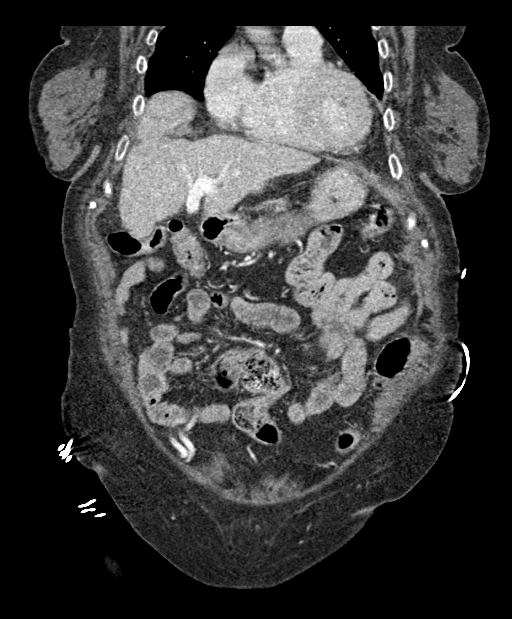
[im 65/147  soft-tissue]
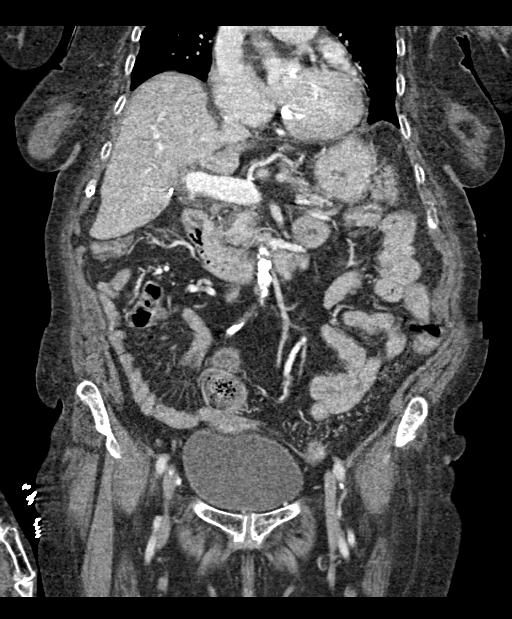
[im 82/147  soft-tissue]
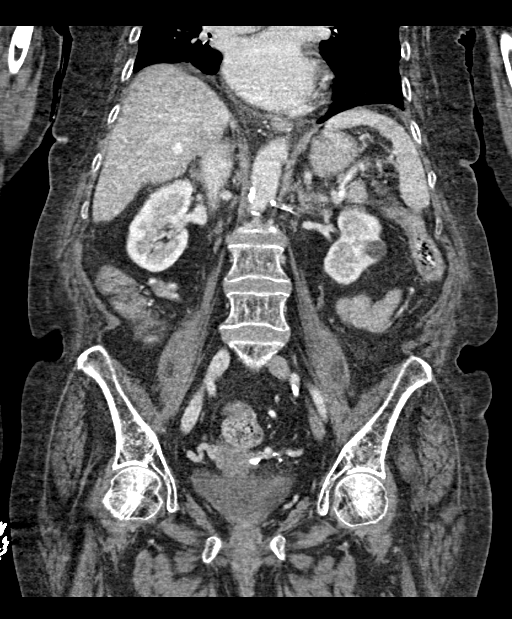

[16 of 46 positions shown; findings below may reference images not displayed]

FINDINGS: Lower chest: A large area of consolidation is seen within the right
lung base. This is increased in size when compared to the prior
study.

A small right pleural effusion is also seen. This is also increased
in size when compared to the prior exam.

Hepatobiliary: The liver is cirrhotic in appearance with diffuse
fatty infiltration of the liver parenchyma. No focal liver
abnormality is seen. Status post cholecystectomy. No biliary
dilatation.

Pancreas: Unremarkable. No pancreatic ductal dilatation or
surrounding inflammatory changes.

Spleen: No splenic injury or perisplenic hematoma.

Adrenals/Urinary Tract: Adrenal glands are unremarkable. Kidneys are
normal in size, without obstructing renal calculi or hydronephrosis.
Stable simple cysts are seen within the left kidney. A 4 mm
nonobstructing renal stone is seen within the lower pole of the
right kidney. Bladder is unremarkable.

Stomach/Bowel: Stomach is within normal limits. Appendix appears
normal. No evidence of bowel wall thickening, distention, or
inflammatory changes.

Vascular/Lymphatic: Aortic atherosclerosis. Recanalization of the
paraumbilical vein is noted with multiple venous collaterals seen.

No enlarged abdominal or pelvic lymph nodes.

Reproductive: Uterus and bilateral adnexa are unremarkable.

Other: No abdominal wall hernia or abnormality. No abdominopelvic
ascites.

Musculoskeletal: A chronic anterolateral sixth left rib deformity is
seen. Additional chronic fracture deformity is seen involving the
right inferior pubic ramus.

Compression fracture deformities are seen at the levels of T9, T10,
T12 and L1, with prior vertebroplasty at the levels of T9 and T10.
IMPRESSION: 1. Marked severity right lower lobe consolidation, increased in
severity when compared to the prior study.
2. Small right pleural effusion.
3. Cirrhosis and portal hypertension.
4. Prior cholecystectomy.
5. Multiple compression fractures within the thoracic and vertebral
spine, as described above.

## 2020-10-22 IMAGING — CT CT HEAD W/O CM
3 of 4 series · 15 of 47 positions shown, 18 images · non-contrast
Comparison: [DATE]

CLINICAL DATA: Lethargic.

EXAM:
CT HEAD WITHOUT CONTRAST
TECHNIQUE: Contiguous axial images were obtained from the base of the skull
through the vertex without intravenous contrast.

[Series 4: head wo · axial · 0.44mm/px · z∈[-92,+28]mm · 9 of 31 slices shown, 12 images]
[im 4/31  brain]
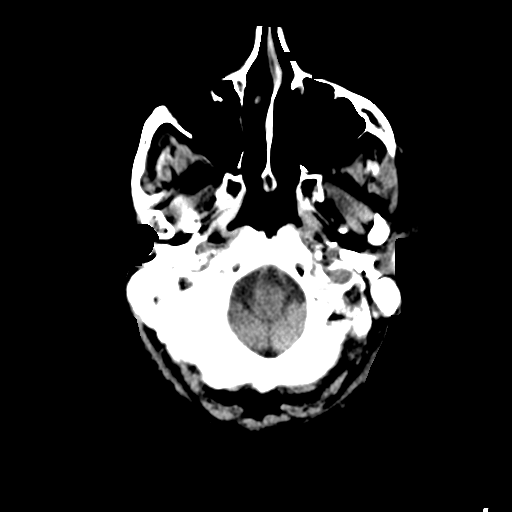
[im 4/31  bone]
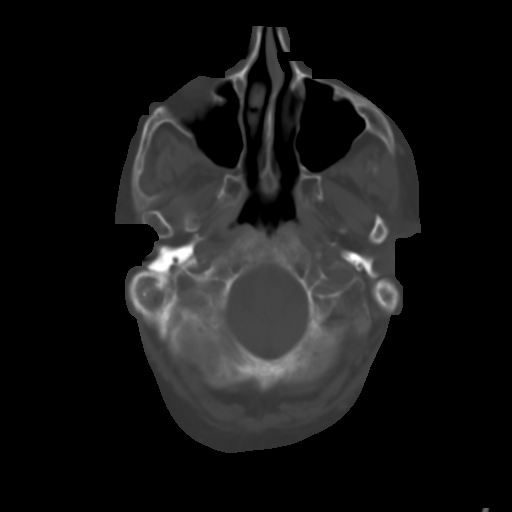
[im 7/31  brain]
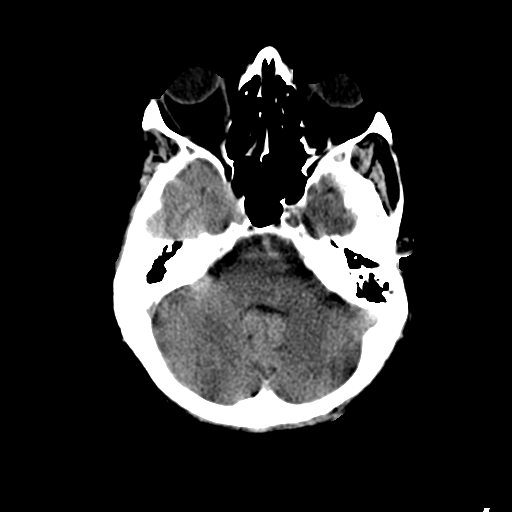
[im 10/31  brain]
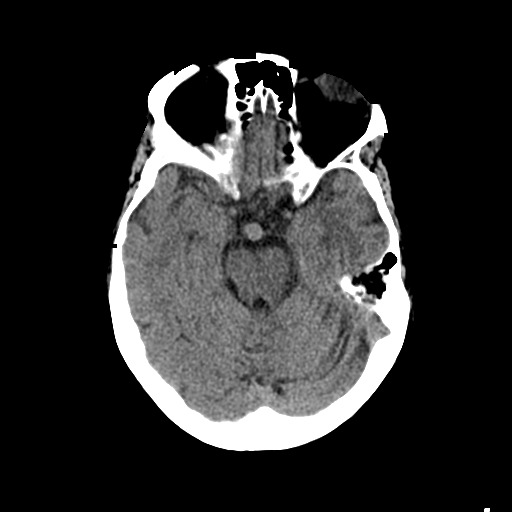
[im 13/31  brain]
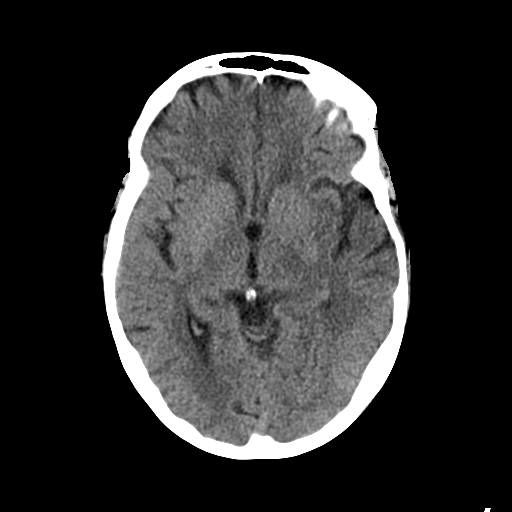
[im 16/31  brain]
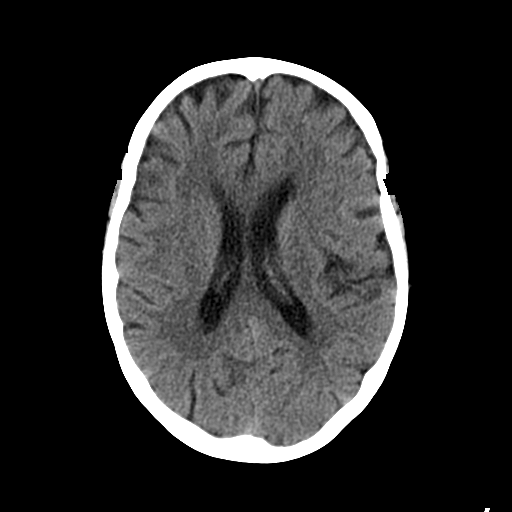
[im 16/31  bone]
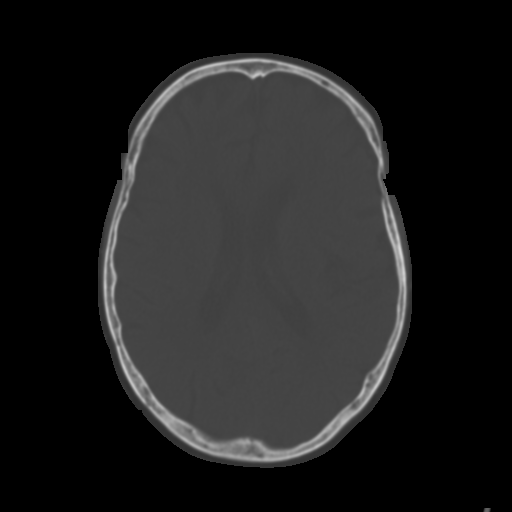
[im 19/31  brain]
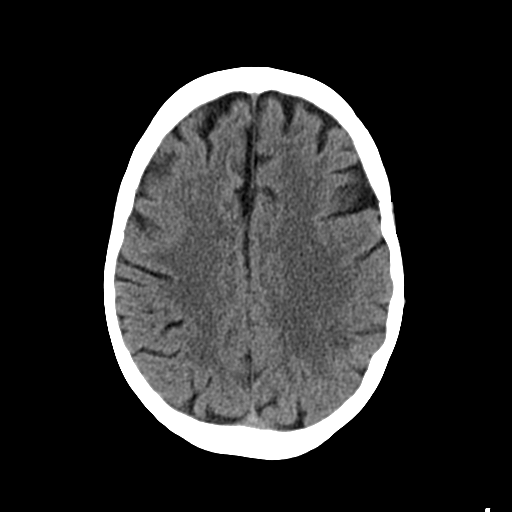
[im 22/31  brain]
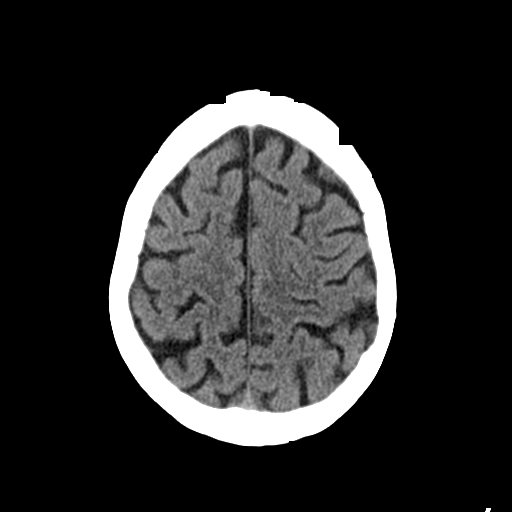
[im 25/31  brain]
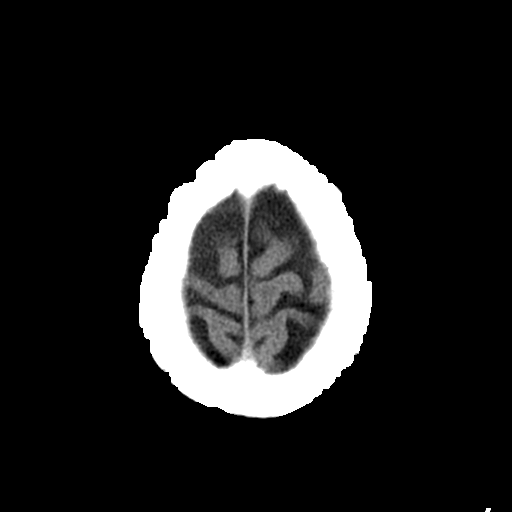
[im 28/31  brain]
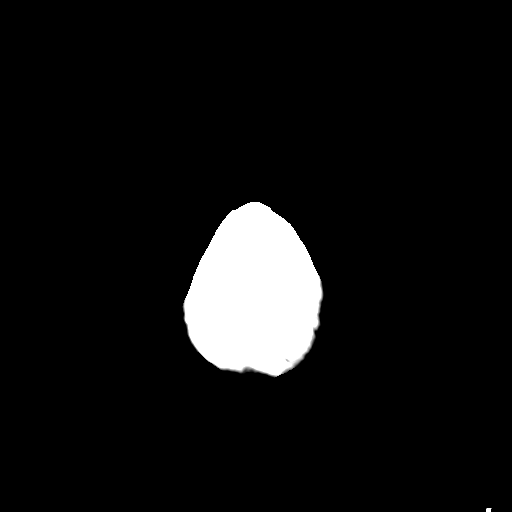
[im 28/31  bone]
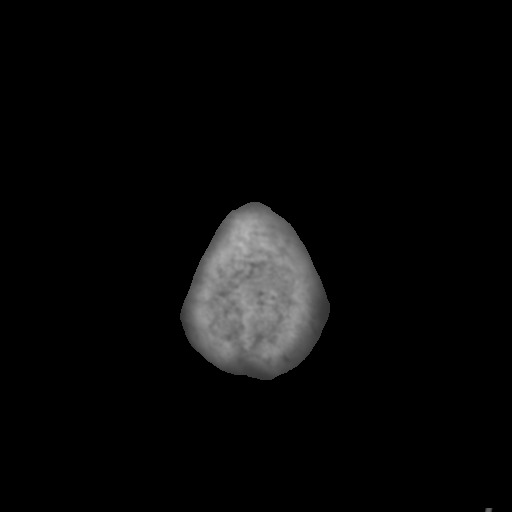

[Series 6: coronal soft tissue · coronal · 0.32mm/px · 3 of 70 slices shown]
[im 24/70  brain]
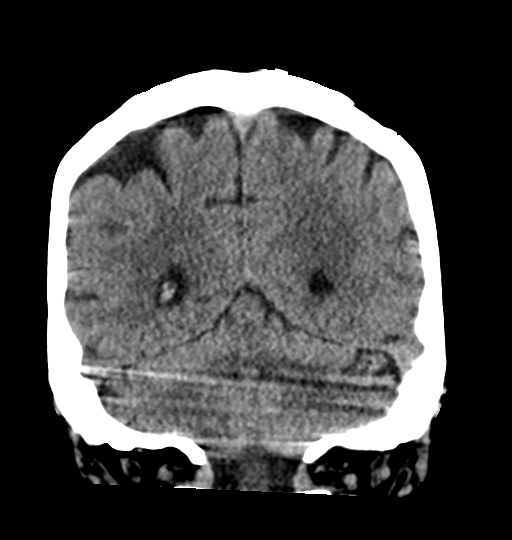
[im 31/70  brain]
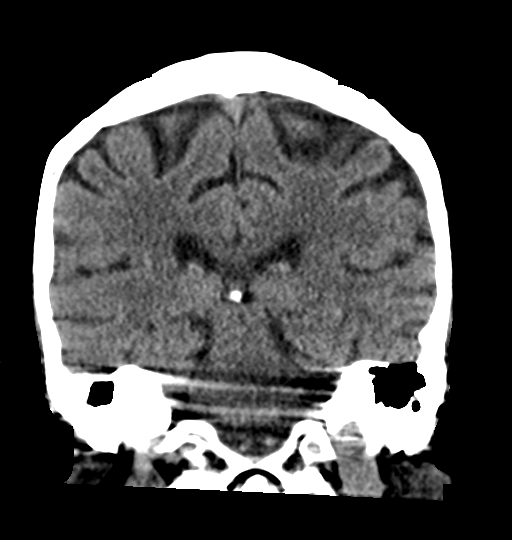
[im 39/70  brain]
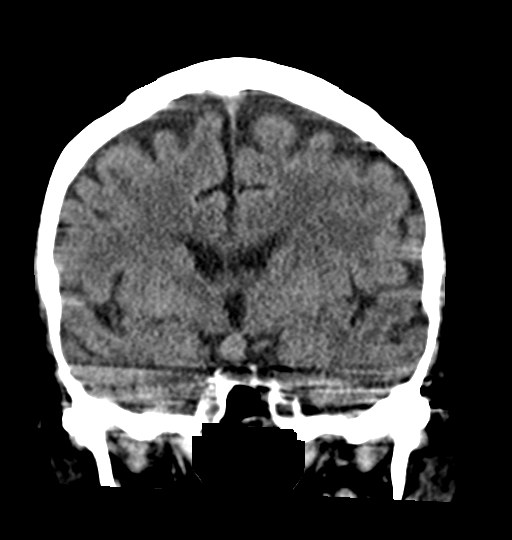

[Series 7: sagittal soft tissue · sagittal · 0.34mm/px · 3 of 52 slices shown]
[im 18/52  brain]
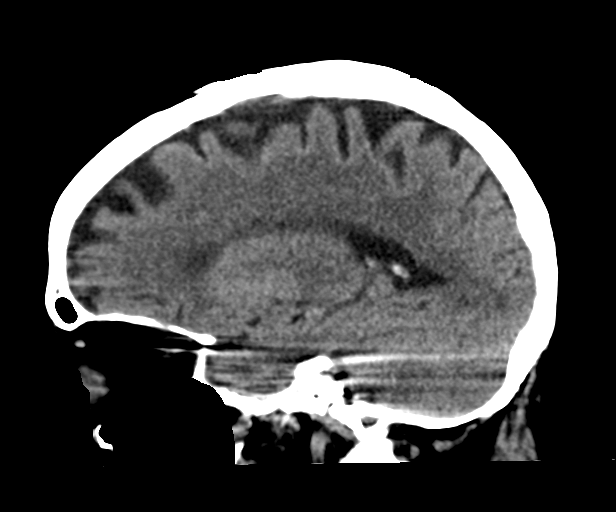
[im 26/52  brain]
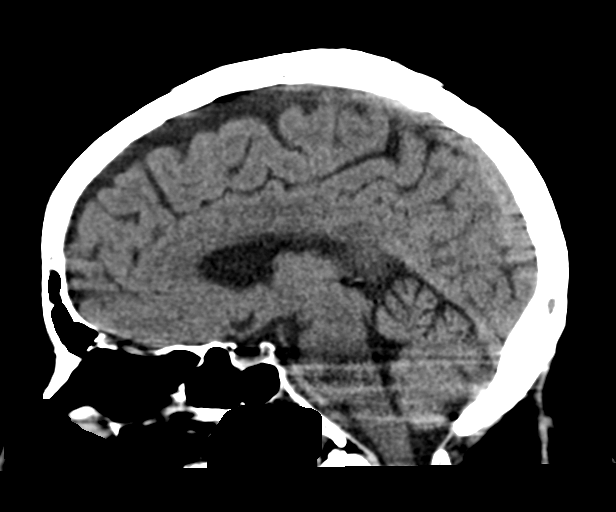
[im 35/52  brain]
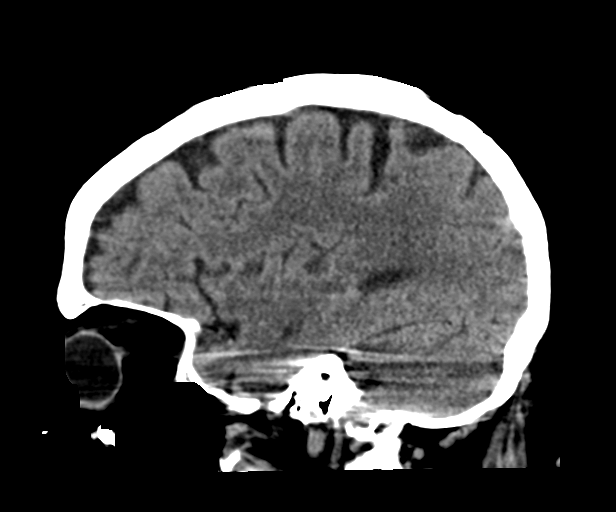

[15 of 47 positions shown; findings below may reference images not displayed]

FINDINGS: Brain: There is mild cerebral atrophy with widening of the
extra-axial spaces and ventricular dilatation.
There are areas of decreased attenuation within the white matter
tracts of the supratentorial brain, consistent with microvascular
disease changes.

Vascular: Approximately 7.0 mm x 7.6 mm stable aneurysmal dilatation
of the basilar artery is noted.

Skull: Normal. Negative for fracture or focal lesion.

Sinuses/Orbits: No acute finding.

Other: None.
IMPRESSION: 1. Generalized cerebral atrophy.
2. No acute intracranial abnormality.
3. Stable aneurysmal dilatation of the basilar artery.

## 2020-10-22 MED ORDER — MAGNESIUM OXIDE -MG SUPPLEMENT 400 (240 MG) MG PO TABS
800.0000 mg | ORAL_TABLET | Freq: Once | ORAL | Status: AC
  Administered 2020-10-22: 800 mg via ORAL
  Filled 2020-10-22: qty 2

## 2020-10-22 MED ORDER — POTASSIUM CHLORIDE CRYS ER 20 MEQ PO TBCR
40.0000 meq | EXTENDED_RELEASE_TABLET | Freq: Once | ORAL | Status: AC
  Administered 2020-10-22: 40 meq via ORAL
  Filled 2020-10-22: qty 2

## 2020-10-22 MED ORDER — SODIUM CHLORIDE 0.9 % IV BOLUS
1000.0000 mL | Freq: Once | INTRAVENOUS | Status: AC
  Administered 2020-10-22: 1000 mL via INTRAVENOUS

## 2020-10-22 MED ORDER — POTASSIUM CHLORIDE 10 MEQ/100ML IV SOLN
10.0000 meq | INTRAVENOUS | Status: AC
  Administered 2020-10-22 – 2020-10-23 (×2): 10 meq via INTRAVENOUS
  Filled 2020-10-22 (×2): qty 100

## 2020-10-22 MED ORDER — IOHEXOL 300 MG/ML  SOLN
100.0000 mL | Freq: Once | INTRAMUSCULAR | Status: AC | PRN
  Administered 2020-10-22: 100 mL via INTRAVENOUS

## 2020-10-22 NOTE — ED Provider Notes (Signed)
[facility] DEPT Provider Note   CSN: [id number] Arrival date & time: 10/22/20  2132     History Chief Complaint  Patient presents with  . Abdominal Pain    [name] is a 70 y.o. female.  70 yo F with a chief complaints of altered mental status and fatigue.  Going on for a few days.  Patient was just in the hospital with multiple compression fractures after a fall requiring kyphoplasty.  That hospitalization was complicated by altered mental status and difficulty breathing.  She eventually had improvement and was discharged home.  Family is concerned because she has not been as active as she typically was.  She is on narcotics for her back.  She tells me that her abdomen hurts worse than the epigastrium.  Denies nausea vomiting or diarrhea.  The history is provided by the patient.  Abdominal Pain Pain location:  Epigastric Pain quality: aching and sharp   Pain radiates to:  Does not radiate Pain severity:  Moderate Onset quality:  Gradual Duration:  2 days Timing:  Constant Progression:  Worsening Chronicity:  New Relieved by:  Nothing Worsened by:  Nothing Ineffective treatments:  None tried Associated symptoms: no chest pain, no chills, no dysuria, no fever, no nausea, no shortness of breath and no vomiting        Past Medical History:  Diagnosis Date  . ACROCHORDON 03/16/2010  . GI bleeding 06/2018  . Heart murmur   . Hypertension   . Hypothyroidism   . Liver cirrhosis ([place])   . Multiple duodenal ulcers 07/16/2018   associated with melena, anemia, gastritis. due to NSAID.  hx gastric ulcers and bleeding 2004, 2007  . Thyroid disease    S/p Radiation    Patient Active Problem List   Diagnosis Date Noted  . Hypokalemia due to excessive gastrointestinal loss of potassium 10/08/2020  . Abdominal pain 10/08/2020  . Back pain 10/08/2020  . Hypomagnesemia 10/08/2020  . Compression fracture of body of thoracic vertebra ([place])  10/08/2020  . History of cirrhosis of liver 10/08/2020  . Irregular heart beat 05/04/2020  . Duodenal ulcer   . GI bleed 07/15/2018  . Leukocytosis 07/15/2018  . Hyponatremia 07/15/2018  . Essential hypertension 07/15/2018  . Hypothyroidism 07/15/2018  . Melena   . Acute gallstone pancreatitis   . Gallstone pancreatitis 12/05/2015  . Acute pancreatitis 12/05/2015  . Choledocholithiasis   . Fracture of proximal end of right humerus 07/22/2013  . Tibia/fibula fracture 07/18/2013  . Mood disorder ([place]) 05/19/2013  . Undiagnosed cardiac murmurs 04/13/2013  . Preventive measure 04/13/2013  . Bradycardia 11/21/2011  . Sleep difficulties 11/21/2011  . HYPERTENSION, BENIGN ESSENTIAL 03/22/2009  . TOBACCO ABUSE 10/01/2007  . HYPOTHYROIDISM, POST-RADIOACTIVE IODINE 06/13/2007  . CIRRHOSIS, ALCOHOLIC, LIVER 94/85/4627    Past Surgical History:  Procedure Laterality Date  . BIOPSY  07/16/2018   Procedure: BIOPSY;  Surgeon: Mauri Pole, MD;  Location: [facility];  Service: Endoscopy;;  . BREAST EXCISIONAL BIOPSY Left    benign  . CHOLECYSTECTOMY N/A 12/07/2015   Procedure: LAPAROSCOPIC CHOLECYSTECTOMY WITH INTRAOPERATIVE CHOLANGIOGRAM;  Surgeon: Alphonsa Overall, MD;  Location: [facility];  Service: General;  Laterality: N/A;  . ERCP N/A 12/06/2015   Procedure: ENDOSCOPIC RETROGRADE CHOLANGIOPANCREATOGRAPHY (ERCP);  Surgeon: Milus Banister, MD;  Location: [facility];  Service: Endoscopy;  Laterality: N/A;  . ESOPHAGOGASTRODUODENOSCOPY (EGD) WITH PROPOFOL N/A 07/16/2018   Procedure: ESOPHAGOGASTRODUODENOSCOPY (EGD) WITH PROPOFOL;  Surgeon: Mauri Pole, MD;  Location: [facility]  ENDOSCOPY;  Service: Endoscopy;  Laterality: N/A;  . IM NAILING TIBIA Right 07/19/2012   DR Maureen Ralphs  . IR VERTEBROPLASTY CERV/THOR BX INC UNI/BIL INC/INJECT/IMAGING  10/17/2020  . IR VERTEBROPLASTY EA ADDL (T&LS) BX INC UNI/BIL INC INJECT/IMAGING  10/17/2020  . SHOULDER HEMI-ARTHROPLASTY Right 07/22/2013    Procedure: RIGHT SHOULDER HEMI-ARTHROPLASTY;  Surgeon: Augustin Schooling, MD;  Location: [facility];  Service: Orthopedics;  Laterality: Right;  . TIBIA IM NAIL INSERTION Right 07/19/2013   Procedure: INTRAMEDULLARY (IM) NAIL TIBIAL;  Surgeon: Gearlean Alf, MD;  Location: [facility];  Service: Orthopedics;  Laterality: Right;  . TUBAL LIGATION       OB History   No obstetric history on file.     Family History  Problem Relation Age of Onset  . Heart disease Mother   . Lung cancer Father   . Heart disease Brother   . Psoriasis Brother   . Heart disease Brother   . Healthy Daughter   . Healthy Daughter     Social History   Tobacco Use  . Smoking status: Current Every Day Smoker    Packs/day: 1.00    Years: 40.00    Pack years: 40.00    Types: Cigarettes  . Smokeless tobacco: Never Used  Vaping Use  . Vaping Use: Never used  Substance Use Topics  . Alcohol use: No    Comment: stopped drinking 5 weeks ago/updated 07/09/2018  . Drug use: No    Home Medications Prior to Admission medications   Medication Sig Start Date End Date Taking? Authorizing Provider  acetaminophen (TYLENOL) 500 MG tablet Take 1 tablet (500 mg total) by mouth every 6 (six) hours as needed for mild pain, fever or headache. 10/18/20 11/17/20  Elodia Florence., MD  Ascorbic Acid (VITAMIN C) 100 MG tablet Take 100 mg by mouth daily.    [provider]  B Complex Vitamins (B COMPLEX 1 PO) Take 1 tablet by mouth daily.    [provider]  Bilberry, Vaccinium myrtillus, (BILBERRY FRUIT PO) Take 1 tablet by mouth daily.    [provider]  calcium carbonate (OSCAL) 1500 (600 Ca) MG TABS tablet Take 600 mg of elemental calcium by mouth daily with breakfast.    [provider]  citalopram (CELEXA) 20 MG tablet TAKE 1 TABLET BY MOUTH EVERY DAY. Patient taking differently: Take 20 mg by mouth daily. 03/17/14   Patrecia Pour, MD  diclofenac Sodium (VOLTAREN) 1 % GEL Apply 2 g topically  4 (four) times daily as needed (pain). 10/18/20   Elodia Florence., MD  diltiazem (CARDIZEM CD) 120 MG 24 hr capsule Take 1 capsule (120 mg total) by mouth daily. 10/19/20 11/18/20  Elodia Florence., MD  ferrous sulfate 325 (65 FE) MG EC tablet Take 1 tablet (325 mg total) by mouth 3 (three) times daily with meals for 30 days. Patient not taking: Reported on 10/09/2020 07/18/18 08/17/18  Gerlean Ren Chirag, MD  Flaxseed, Linseed, (FLAX SEED OIL PO) Take 1 tablet by mouth daily.    [provider]  folic acid (FOLVITE) Q000111Q MCG tablet Take 400 mcg by mouth daily.    [provider]  hydrALAZINE (APRESOLINE) 25 MG tablet Take 3 tablets (75 mg total) by mouth 3 (three) times daily. 10/18/20 11/17/20  Elodia Florence., MD  levothyroxine (SYNTHROID) 100 MCG tablet Take 1 tablet (100 mcg total) by mouth daily at 6 (six) AM. Please follow up repeat labs in  4-6 weeks with PCP. 10/19/20 11/18/20  Elodia Florence., MD  lisinopril (ZESTRIL) 40 MG tablet Take 40 mg by mouth daily.    [provider]  LUTEIN PO Take 1 tablet by mouth daily.    [provider]  Multiple Vitamin (MULTIVITAMIN WITH MINERALS) TABS tablet Take 1 tablet by mouth daily.    [provider]  Multiple Vitamins-Minerals (MULTIVITAMIN & MINERAL PO) Take 1 tablet by mouth daily.    [provider]  multivitamin-lutein (OCUVITE-LUTEIN) CAPS capsule Take 1 capsule by mouth daily.    [provider]  omega-3 acid ethyl esters (LOVAZA) 1 g capsule Take 1 capsule (1 g total) by mouth daily. 10/18/20 11/17/20  Elodia Florence., MD  Omega-3 Fatty Acids (FISH OIL CONCENTRATE) 1000 MG CAPS Take 1,000 mg by mouth daily.    [provider]  pantoprazole (PROTONIX) 40 MG tablet Take 1 tablet (40 mg total) by mouth at bedtime. 10/18/20 11/17/20  Elodia Florence., MD  VITAMIN E PO Take 1 tablet by mouth daily.    [provider]    Allergies    Patient  has no active allergies.  Review of Systems   Review of Systems  Constitutional: Negative for chills and fever.  HENT: Negative for congestion and rhinorrhea.   Eyes: Negative for redness and visual disturbance.  Respiratory: Negative for shortness of breath and wheezing.   Cardiovascular: Negative for chest pain and palpitations.  Gastrointestinal: Positive for abdominal pain. Negative for nausea and vomiting.  Genitourinary: Negative for dysuria and urgency.  Musculoskeletal: Negative for arthralgias and myalgias.  Skin: Negative for pallor and wound.  Neurological: Negative for dizziness and headaches.    Physical Exam Updated Vital Signs BP 123/75   Pulse 92   Temp (!) 96.6 F (35.9 C) (Rectal)   Resp (!) 25   Ht 5\' 4"  (1.626 m)   Wt 79 kg   SpO2 97%   BMI 29.90 kg/m   Physical Exam Vitals and nursing note reviewed.  Constitutional:      General: She is not in acute distress.    Appearance: She is well-developed. She is not diaphoretic.  HENT:     Head: Normocephalic and atraumatic.  Eyes:     Pupils: Pupils are equal, round, and reactive to light.  Cardiovascular:     Rate and Rhythm: Normal rate and regular rhythm.     Heart sounds: No murmur heard. No friction rub. No gallop.   Pulmonary:     Effort: Pulmonary effort is normal.     Breath sounds: No wheezing or rales.  Abdominal:     General: There is no distension.     Palpations: Abdomen is soft.     Tenderness: There is abdominal tenderness.     Comments: Mild pain in the right upper quadrant worse to the epigastrium.  Musculoskeletal:        General: No tenderness.     Cervical back: Normal range of motion and neck supple.  Skin:    General: Skin is warm and dry.  Neurological:     Mental Status: She is alert and oriented to person, place, and time.  Psychiatric:        Behavior: Behavior normal.     ED Results / Procedures / Treatments   Labs (all labs ordered are listed, but only abnormal  results are displayed) Labs Reviewed  COMPREHENSIVE METABOLIC PANEL - Abnormal; Notable for the following components:  Result Value   Sodium 130 (*)    Potassium 2.8 (*)    Chloride 92 (*)    Total Protein 6.0 (*)    Albumin 3.4 (*)    All other components within normal limits  BLOOD GAS, VENOUS - Abnormal; Notable for the following components:   pO2, Ven 53.2 (*)    Acid-Base Excess 3.0 (*)    All other components within normal limits  URINALYSIS, ROUTINE W REFLEX MICROSCOPIC - Abnormal; Notable for the following components:   Ketones, ur 5 (*)    Protein, ur 30 (*)    All other components within normal limits  I-STAT CHEM 8, ED - Abnormal; Notable for the following components:   Sodium 127 (*)    Potassium 2.8 (*)    Chloride 88 (*)    All other components within normal limits  CULTURE, BLOOD (ROUTINE X 2)  CULTURE, BLOOD (ROUTINE X 2)  AMMONIA  LACTIC ACID, PLASMA  ETHANOL  MAGNESIUM  CBC WITH DIFFERENTIAL/PLATELET  TSH  T4, FREE  CBG MONITORING, ED    EKG EKG Interpretation  Date/Time:  Saturday October 22 2020 21:58:24 EDT Ventricular Rate:  103 PR Interval:    QRS Duration: 94 QT Interval:  377 QTC Calculation: 494 R Axis:   52 Text Interpretation: Atrial fibrillation Borderline ST depression, diffuse leads Borderline prolonged QT interval No significant change since last tracing Confirmed by Deno Etienne [phone]) on 10/22/2020 10:54:11 PM   Radiology DG Chest Port 1 View  Result Date: 10/22/2020 CLINICAL DATA:  Lethargic. EXAM: PORTABLE CHEST 1 VIEW COMPARISON:  October 12, 2020 FINDINGS: Chronic appearing increased lung markings are seen without evidence of acute infiltrate, pleural effusion or pneumothorax. The cardiac silhouette is mildly enlarged. Multiple chronic right-sided rib fractures are seen. A radiopaque right shoulder replacement is noted. There is evidence of prior vertebroplasty within the lower thoracic spine. IMPRESSION: Stable cardiomegaly and  chronic changes without acute cardiopulmonary disease. Electronically Signed   By: Virgina Norfolk M.D.   On: 10/22/2020 23:11    Procedures Procedures   Medications Ordered in ED Medications  potassium chloride 10 mEq in 100 mL IVPB (has no administration in time range)  potassium chloride SA (KLOR-CON) CR tablet 40 mEq (has no administration in time range)  magnesium oxide TABS 800 mg (has no administration in time range)  sodium chloride 0.9 % bolus 1,000 mL (1,000 mLs Intravenous New Bag/Given (Non-Interop) 10/22/20 2232)  iohexol (OMNIPAQUE) 300 MG/ML solution 100 mL (100 mLs Intravenous Contrast Given 10/22/20 2312)    ED Course  I have reviewed the triage vital signs and the nursing notes.  Pertinent labs & imaging results that were available during my care of the patient were reviewed by me and considered in my medical decision making (see chart for details).    MDM Rules/Calculators/A&P                          70 yo F with a chief complaints of fatigue and increased sleepiness and abdominal pain.  Going on for a couple days.  Recently in the hospital for a fall suffering multiple compression fractures.  Could be due to narcotics, will obtain an altered mental status work-up.  CT scan of the head and abdomen pelvis.  Hypothermic hypokalemic.  Will replenish.  Signed out to Dr. Leonette Monarch, please see his note for further details of care in ED.   The patients results and plan were reviewed and discussed.  Any x-rays performed were independently reviewed by myself.   Differential diagnosis were considered with the presenting HPI.  Medications  potassium chloride 10 mEq in 100 mL IVPB (has no administration in time range)  potassium chloride SA (KLOR-CON) CR tablet 40 mEq (has no administration in time range)  magnesium oxide TABS 800 mg (has no administration in time range)  sodium chloride 0.9 % bolus 1,000 mL (1,000 mLs Intravenous New Bag/Given (Non-Interop) 10/22/20 2232)   iohexol (OMNIPAQUE) 300 MG/ML solution 100 mL (100 mLs Intravenous Contrast Given 10/22/20 2312)    Vitals:   10/22/20 2202 10/22/20 2215 10/22/20 2230 10/22/20 2251  BP: 126/83 123/75    Pulse: 91 73 92   Resp: (!) 26 (!) 23 (!) 25   Temp:    (!) 96.6 F (35.9 C)  TempSrc:    Rectal  SpO2: 97% 96% 97%   Weight:      Height:        Final diagnoses:  Transient alteration of awareness       Final Clinical Impression(s) / ED Diagnoses Final diagnoses:  Transient alteration of awareness    Rx / DC Orders ED Discharge Orders    None       Deno Etienne, DO 10/22/20 2329

## 2020-10-22 NOTE — ED Triage Notes (Signed)
Pt arrives via EMS from home. Pt had a vertebral procedure in her back on Monday. Pt has been lethargic, sleepy, not eating. Pt is on narcotic pain medications (Roxicet) for pain control during that time. Family wants an eval.

## 2020-10-23 ENCOUNTER — Encounter (HOSPITAL_COMMUNITY): Payer: Self-pay | Admitting: Internal Medicine

## 2020-10-23 DIAGNOSIS — Z8249 Family history of ischemic heart disease and other diseases of the circulatory system: Secondary | ICD-10-CM | POA: Diagnosis not present

## 2020-10-23 DIAGNOSIS — E876 Hypokalemia: Secondary | ICD-10-CM | POA: Diagnosis present

## 2020-10-23 DIAGNOSIS — I5032 Chronic diastolic (congestive) heart failure: Secondary | ICD-10-CM | POA: Diagnosis present

## 2020-10-23 DIAGNOSIS — Z9851 Tubal ligation status: Secondary | ICD-10-CM | POA: Diagnosis not present

## 2020-10-23 DIAGNOSIS — G9340 Encephalopathy, unspecified: Secondary | ICD-10-CM | POA: Diagnosis present

## 2020-10-23 DIAGNOSIS — K766 Portal hypertension: Secondary | ICD-10-CM | POA: Diagnosis present

## 2020-10-23 DIAGNOSIS — I110 Hypertensive heart disease with heart failure: Secondary | ICD-10-CM | POA: Diagnosis present

## 2020-10-23 DIAGNOSIS — Z79899 Other long term (current) drug therapy: Secondary | ICD-10-CM | POA: Diagnosis not present

## 2020-10-23 DIAGNOSIS — Z79890 Hormone replacement therapy (postmenopausal): Secondary | ICD-10-CM | POA: Diagnosis not present

## 2020-10-23 DIAGNOSIS — I4821 Permanent atrial fibrillation: Secondary | ICD-10-CM | POA: Diagnosis present

## 2020-10-23 DIAGNOSIS — M4854XA Collapsed vertebra, not elsewhere classified, thoracic region, initial encounter for fracture: Secondary | ICD-10-CM | POA: Diagnosis present

## 2020-10-23 DIAGNOSIS — J189 Pneumonia, unspecified organism: Secondary | ICD-10-CM | POA: Diagnosis present

## 2020-10-23 DIAGNOSIS — Z923 Personal history of irradiation: Secondary | ICD-10-CM | POA: Diagnosis not present

## 2020-10-23 DIAGNOSIS — R680 Hypothermia, not associated with low environmental temperature: Secondary | ICD-10-CM | POA: Diagnosis present

## 2020-10-23 DIAGNOSIS — R404 Transient alteration of awareness: Secondary | ICD-10-CM | POA: Diagnosis present

## 2020-10-23 DIAGNOSIS — Z9049 Acquired absence of other specified parts of digestive tract: Secondary | ICD-10-CM | POA: Diagnosis not present

## 2020-10-23 DIAGNOSIS — Z66 Do not resuscitate: Secondary | ICD-10-CM | POA: Diagnosis present

## 2020-10-23 DIAGNOSIS — Z20822 Contact with and (suspected) exposure to covid-19: Secondary | ICD-10-CM | POA: Diagnosis present

## 2020-10-23 DIAGNOSIS — E871 Hypo-osmolality and hyponatremia: Secondary | ICD-10-CM | POA: Diagnosis present

## 2020-10-23 DIAGNOSIS — K7030 Alcoholic cirrhosis of liver without ascites: Secondary | ICD-10-CM | POA: Diagnosis present

## 2020-10-23 DIAGNOSIS — E039 Hypothyroidism, unspecified: Secondary | ICD-10-CM | POA: Diagnosis present

## 2020-10-23 DIAGNOSIS — F17210 Nicotine dependence, cigarettes, uncomplicated: Secondary | ICD-10-CM | POA: Diagnosis present

## 2020-10-23 LAB — SODIUM, URINE, RANDOM: Sodium, Ur: <10 mmol/L

## 2020-10-23 LAB — CBC WITH DIFFERENTIAL/PLATELET
Abs Immature Granulocytes: 0.08 10*3/uL — ABNORMAL HIGH (ref 0.00–0.07)
Abs Immature Granulocytes: 0.04 10*3/uL (ref 0.00–0.07)
Basophils Absolute: 0 10*3/uL (ref 0.0–0.1)
Basophils Absolute: 0 10*3/uL (ref 0.0–0.1)
Basophils Relative: 0 %
Basophils Relative: 0 %
Eosinophils Absolute: 0.1 10*3/uL (ref 0.0–0.5)
Eosinophils Absolute: 0.1 10*3/uL (ref 0.0–0.5)
Eosinophils Relative: 1 %
Eosinophils Relative: 1 %
HCT: 34 % — ABNORMAL LOW (ref 36.0–46.0)
HCT: 33.8 % — ABNORMAL LOW (ref 36.0–46.0)
Hemoglobin: 12.6 g/dL (ref 12.0–15.0)
Hemoglobin: 12.4 g/dL (ref 12.0–15.0)
Immature Granulocytes: 1 %
Immature Granulocytes: 0 %
Lymphocytes Relative: 9 %
Lymphocytes Relative: 6 %
Lymphs Abs: 0.9 10*3/uL (ref 0.7–4.0)
Lymphs Abs: 0.6 10*3/uL — ABNORMAL LOW (ref 0.7–4.0)
MCH: 32.8 pg (ref 26.0–34.0)
MCH: 32.9 pg (ref 26.0–34.0)
MCHC: 37.1 g/dL — ABNORMAL HIGH (ref 30.0–36.0)
MCHC: 36.7 g/dL — ABNORMAL HIGH (ref 30.0–36.0)
MCV: 88.5 fL (ref 80.0–100.0)
MCV: 89.7 fL (ref 80.0–100.0)
Monocytes Absolute: 0.7 10*3/uL (ref 0.1–1.0)
Monocytes Absolute: 0.6 10*3/uL (ref 0.1–1.0)
Monocytes Relative: 6 %
Monocytes Relative: 6 %
Neutro Abs: 8.6 10*3/uL — ABNORMAL HIGH (ref 1.7–7.7)
Neutro Abs: 8.4 10*3/uL — ABNORMAL HIGH (ref 1.7–7.7)
Neutrophils Relative %: 83 %
Neutrophils Relative %: 87 %
Platelets: 216 10*3/uL (ref 150–400)
Platelets: 196 10*3/uL (ref 150–400)
RBC: 3.84 MIL/uL — ABNORMAL LOW (ref 3.87–5.11)
RBC: 3.77 MIL/uL — ABNORMAL LOW (ref 3.87–5.11)
RDW: 13.2 % (ref 11.5–15.5)
RDW: 13.2 % (ref 11.5–15.5)
WBC: 10.4 10*3/uL (ref 4.0–10.5)
WBC: 9.8 10*3/uL (ref 4.0–10.5)
nRBC: 0 % (ref 0.0–0.2)
nRBC: 0 % (ref 0.0–0.2)

## 2020-10-23 LAB — BLOOD GAS, ARTERIAL
Acid-Base Excess: 0 mmol/L (ref 0.0–2.0)
Acid-Base Excess: 0.8 mmol/L (ref 0.0–2.0)
Bicarbonate: 26.8 mmol/L (ref 20.0–28.0)
Bicarbonate: 25.5 mmol/L (ref 20.0–28.0)
Delivery systems: POSITIVE
Drawn by: 25788
Expiratory PAP: 5
FIO2: 28
FIO2: 50
Inspiratory PAP: 22
Mode: POSITIVE
O2 Saturation: 98.3 %
O2 Saturation: 98.9 %
Patient temperature: 98.4
Patient temperature: 98.6
pCO2 arterial: 56.2 mmHg — ABNORMAL HIGH (ref 32.0–48.0)
pCO2 arterial: 43.9 mmHg (ref 32.0–48.0)
pH, Arterial: 7.3 — ABNORMAL LOW (ref 7.350–7.450)
pH, Arterial: 7.382 (ref 7.350–7.450)
pO2, Arterial: 133 mmHg — ABNORMAL HIGH (ref 83.0–108.0)
pO2, Arterial: 185 mmHg — ABNORMAL HIGH (ref 83.0–108.0)

## 2020-10-23 LAB — TSH
TSH: 1.34 u[IU]/mL (ref 0.350–4.500)
TSH: 1.164 u[IU]/mL (ref 0.350–4.500)

## 2020-10-23 LAB — PROCALCITONIN: Procalcitonin: <0.1 ng/mL

## 2020-10-23 LAB — COMPREHENSIVE METABOLIC PANEL
ALT: 18 U/L (ref 0–44)
AST: 23 U/L (ref 15–41)
Albumin: 3.3 g/dL — ABNORMAL LOW (ref 3.5–5.0)
Alkaline Phosphatase: 76 U/L (ref 38–126)
Anion gap: 9 (ref 5–15)
BUN: 13 mg/dL (ref 8–23)
CO2: 25 mmol/L (ref 22–32)
Calcium: 8.4 mg/dL — ABNORMAL LOW (ref 8.9–10.3)
Chloride: 93 mmol/L — ABNORMAL LOW (ref 98–111)
Creatinine, Ser: 0.46 mg/dL (ref 0.44–1.00)
GFR, Estimated: >60 mL/min (ref >60)
Glucose, Bld: 70 mg/dL (ref 70–99)
Potassium: 3.9 mmol/L (ref 3.5–5.1)
Sodium: 127 mmol/L — ABNORMAL LOW (ref 135–145)
Total Bilirubin: 0.9 mg/dL (ref 0.3–1.2)
Total Protein: 5.8 g/dL — ABNORMAL LOW (ref 6.5–8.1)

## 2020-10-23 LAB — GLUCOSE, CAPILLARY
Glucose-Capillary: 116 mg/dL — ABNORMAL HIGH (ref 70–99)
Glucose-Capillary: 134 mg/dL — ABNORMAL HIGH (ref 70–99)
Glucose-Capillary: 156 mg/dL — ABNORMAL HIGH (ref 70–99)
Glucose-Capillary: 68 mg/dL — ABNORMAL LOW (ref 70–99)
Glucose-Capillary: 77 mg/dL (ref 70–99)
Glucose-Capillary: 88 mg/dL (ref 70–99)

## 2020-10-23 LAB — T4, FREE: Free T4: 2.1 ng/dL — ABNORMAL HIGH (ref 0.61–1.12)

## 2020-10-23 LAB — OSMOLALITY, URINE: Osmolality, Ur: 575 mOsm/kg (ref 300–900)

## 2020-10-23 LAB — RESP PANEL BY RT-PCR (FLU A&B, COVID) ARPGX2
Influenza A by PCR: NEGATIVE
Influenza B by PCR: NEGATIVE
SARS Coronavirus 2 by RT PCR: NEGATIVE

## 2020-10-23 LAB — AMMONIA: Ammonia: 31 umol/L (ref 9–35)

## 2020-10-23 LAB — MAGNESIUM: Magnesium: 1.8 mg/dL (ref 1.7–2.4)

## 2020-10-23 LAB — CORTISOL: Cortisol, Plasma: 16 ug/dL

## 2020-10-23 LAB — OSMOLALITY: Osmolality: 280 mOsm/kg (ref 275–295)

## 2020-10-23 MED ORDER — IPRATROPIUM BROMIDE 0.02 % IN SOLN
0.5000 mg | Freq: Four times a day (QID) | RESPIRATORY_TRACT | Status: DC
  Administered 2020-10-23 – 2020-10-24 (×4): 0.5 mg via RESPIRATORY_TRACT
  Filled 2020-10-23 (×4): qty 2.5

## 2020-10-23 MED ORDER — ACETAMINOPHEN 650 MG RE SUPP: 650.0000 mg | Freq: Four times a day (QID) | RECTAL | Status: DC | PRN

## 2020-10-23 MED ORDER — PIPERACILLIN-TAZOBACTAM 3.375 G IVPB 30 MIN
3.3750 g | Freq: Once | INTRAVENOUS | Status: AC
  Administered 2020-10-23: 3.375 g via INTRAVENOUS
  Filled 2020-10-23: qty 50

## 2020-10-23 MED ORDER — LEVALBUTEROL HCL 0.63 MG/3ML IN NEBU: 0.6300 mg | INHALATION_SOLUTION | Freq: Four times a day (QID) | RESPIRATORY_TRACT | Status: DC | PRN

## 2020-10-23 MED ORDER — SODIUM CHLORIDE 0.9 % IV SOLN
500.0000 mg | INTRAVENOUS | Status: DC
  Administered 2020-10-23 – 2020-10-25 (×3): 500 mg via INTRAVENOUS
  Filled 2020-10-23 (×3): qty 500

## 2020-10-23 MED ORDER — IPRATROPIUM-ALBUTEROL 0.5-2.5 (3) MG/3ML IN SOLN: 3.0000 mL | Freq: Four times a day (QID) | RESPIRATORY_TRACT | Status: DC

## 2020-10-23 MED ORDER — BUDESONIDE 0.25 MG/2ML IN SUSP
0.2500 mg | Freq: Two times a day (BID) | RESPIRATORY_TRACT | Status: DC
  Administered 2020-10-23 – 2020-10-27 (×8): 0.25 mg via RESPIRATORY_TRACT
  Filled 2020-10-23 (×8): qty 2

## 2020-10-23 MED ORDER — SODIUM CHLORIDE 0.9 % IV SOLN
2.0000 g | INTRAVENOUS | Status: AC
  Administered 2020-10-23 – 2020-10-27 (×5): 2 g via INTRAVENOUS
  Filled 2020-10-23 (×4): qty 2
  Filled 2020-10-23: qty 20

## 2020-10-23 MED ORDER — NALOXONE HCL 0.4 MG/ML IJ SOLN
0.2000 mg | Freq: Once | INTRAMUSCULAR | Status: AC
  Administered 2020-10-23: 0.2 mg via INTRAVENOUS
  Filled 2020-10-23: qty 1

## 2020-10-23 MED ORDER — LEVALBUTEROL HCL 0.63 MG/3ML IN NEBU
0.6300 mg | INHALATION_SOLUTION | Freq: Four times a day (QID) | RESPIRATORY_TRACT | Status: DC
  Administered 2020-10-23 – 2020-10-24 (×4): 0.63 mg via RESPIRATORY_TRACT
  Filled 2020-10-23 (×4): qty 3

## 2020-10-23 MED ORDER — ACETAMINOPHEN 325 MG PO TABS
650.0000 mg | ORAL_TABLET | Freq: Four times a day (QID) | ORAL | Status: DC | PRN
  Administered 2020-10-23 – 2020-10-27 (×10): 650 mg via ORAL
  Filled 2020-10-23 (×10): qty 2

## 2020-10-23 MED ORDER — ENOXAPARIN SODIUM 40 MG/0.4ML IJ SOSY
40.0000 mg | PREFILLED_SYRINGE | INTRAMUSCULAR | Status: DC
  Administered 2020-10-23 – 2020-10-27 (×5): 40 mg via SUBCUTANEOUS
  Filled 2020-10-23 (×5): qty 0.4

## 2020-10-23 MED ORDER — LEVOTHYROXINE SODIUM 100 MCG/5ML IV SOLN
50.0000 ug | Freq: Every day | INTRAVENOUS | Status: DC
  Administered 2020-10-23 – 2020-10-24 (×2): 50 ug via INTRAVENOUS
  Filled 2020-10-23 (×3): qty 5

## 2020-10-23 MED ORDER — METOPROLOL TARTRATE 5 MG/5ML IV SOLN
5.0000 mg | Freq: Four times a day (QID) | INTRAVENOUS | Status: DC
  Administered 2020-10-23 – 2020-10-25 (×8): 5 mg via INTRAVENOUS
  Filled 2020-10-23 (×9): qty 5

## 2020-10-23 MED ORDER — DEXTROSE 50 % IV SOLN
INTRAVENOUS | Status: AC
  Filled 2020-10-23: qty 50

## 2020-10-23 MED ORDER — PANTOPRAZOLE SODIUM 40 MG IV SOLR
40.0000 mg | INTRAVENOUS | Status: DC
  Administered 2020-10-23 – 2020-10-24 (×2): 40 mg via INTRAVENOUS
  Filled 2020-10-23 (×2): qty 40

## 2020-10-23 MED ORDER — ONDANSETRON HCL 4 MG/2ML IJ SOLN
4.0000 mg | Freq: Once | INTRAMUSCULAR | Status: AC
  Administered 2020-10-23: 4 mg via INTRAVENOUS
  Filled 2020-10-23: qty 2

## 2020-10-23 NOTE — Progress Notes (Signed)
A consult was received from an ED physician for Zosyn per pharmacy dosing.  The patient's profile has been reviewed for ht/wt/allergies/indication/available labs.   A one time order has been placed for Zosyn 3.375gm IV x1.  Further antibiotics/pharmacy consults should be ordered by admitting physician if indicated.                       Thank you, Netta Cedars PharmD 10/23/2020  1:29 AM

## 2020-10-23 NOTE — Progress Notes (Signed)
PROGRESS NOTE    [name]  SAY:[id number] DOB: 1951/03/07 DOA: 10/22/2020 PCP: [name], MD  Outpatient Specialists:   Brief Narrative:  As per H&P done earlier today by Dr. Hal Hope: "[name] is a 70 y.o. female with history of liver cirrhosis recently diagnosed with A. fib recent admission for A. fib and compression fracture of the thoracic spine during which patient underwent kyphoplasty has been on narcotic pain relief medication was found to be increasingly confused by family last couple days.  Was brought to the ER.  In the ER patient also was complaining of some abdominal discomfort.  By time I examined the patient patient is more lethargic to give any history.  ED Course: In the ER patient is lethargic but afebrile.  Labs show potassium of 2.8 with CT head unremarkable CT abdomen pelvis shows worsening consolidation in the right lower lung field concerning for pneumonia.  Blood cultures were obtained and started on empiric antibiotics for pneumonia and admitted for further work-up.  COVID test is negative.  On exam patient is lethargic but arousable and following commands.  I have ordered ABG which is pending".  10/23/2020: Patient seen alongside patient's daughter and nurse.  Patient is awake and communicative.  Patient remains mildly lethargic.  ABG has improved.  Ammonia level is down to 31.  Imaging studies revealed worsening right lower lobe consolidation.  Patient has greater than 30 pack years.  As per the nursing staff, no aspiration noted.  No fever or chills.  No cough.  We will have a low threshold to consult pulmonary team.  Patient is currently on IV Rocephin and azithromycin.  We will check procalcitonin.   Assessment & Plan:   Principal Problem:   Acute encephalopathy Active Problems:   HYPERTENSION, BENIGN ESSENTIAL   CIRRHOSIS, ALCOHOLIC, LIVER   Essential hypertension   Pneumonia   Acute encephalopathy could be multifactorial  primary concerning for narcotic use.  Given the history of liver cirrhosis will check ammonia levels I have ordered ABG to see if there is any carbon dioxide retention.  We will keep patient n.p.o. for now and if ABG is unremarkable ammonia is normal we will look for other causes. 10/23/2020: Resolving.  Repeat ABG has improved.  Will manage expectantly.  Worsening pneumonia on the CAT scan for which patient is on antibiotics.  Follow cultures.  COVID test is negative. 10/23/2020: Patient has greater than 30-pack-year history.  No constitutional symptoms.  Will rule out aspiration.  Will check procalcitonin.  We will have a low threshold to consult pulmonary team.  A. fib rate is around 100 bpm.  Patient takes Cardizem at home.  Since patient is n.p.o. I have placed patient on scheduled dose of IV metoprolol until patient can take oral Cardizem.  Patient was felt not a good candidate for anticoagulation.  Diastolic dysfunction per recent 2D echo.  Closely monitor respiratory status.  Hypothyroidism on Synthroid dose was recently decreased.  I have dosed Synthroid as IV for now until patient can take orally.  TSH is 1.34.  Cirrhosis of the liver with portal hypertension seen in the CAT scan.  CT abdomen pelvis does not mention about any ascites.  Patient is on empirically antibiotics for pneumonia.  Check ammonia levels. 10/23/2020: Ammonia level was 31.  Recent kyphoplasty for thoracic spine fracture.  Holding any pain medications sedatives for now.  History of hypertension presently on scheduled dose of IV metoprolol. 10/23/2020: Continue to optimize.  Hypokalemia -replace and recheck.  Check magnesium levels.  Cause of hypokalemia not clear. 10/23/2020: Repeat potassium is 3.9.    Chronic hyponatremia: -Patient has hyponatremia dating back 2 to 7 years. -We will check urine sediment, urine and serum osmolality. -Suspect SIADH. -I have a low threshold to work-up for lung malignancy.  However, last  CT chest was done 10 days ago.  Have a low threshold to consult pulmonary team.    DVT prophylaxis: Lovenox Code Status: Full code. Family Communication: Daughter. Disposition Plan: Home eventually.   Consultants:   Have a low threshold to consult pulmonary team  Procedures:   None  Antimicrobials:   IV azithromycin.  IV ceftriaxone.   Subjective: Encephalopathy is resolving. No fever or chills. No cough.  Objective: Vitals:   10/23/20 0600 10/23/20 0738 10/23/20 0836 10/23/20 1105  BP: 131/75 117/85    Pulse: (!) 101 85 85 85  Resp:  16 16 17   Temp:  97.6 F (36.4 C)    TempSrc:      SpO2: 100% 100% 100% 98%  Weight:      Height:        Intake/Output Summary (Last 24 hours) at 10/23/2020 1212 Last data filed at 10/23/2020 0400 Gross per 24 hour  Intake 0 ml  Output --  Net 0 ml   Filed Weights   10/22/20 2149  Weight: 79 kg    Examination:  General exam: Appears calm and comfortable  Respiratory system: Decreased air entry globally.   Cardiovascular system: S1 & S2 heard Gastrointestinal system: Abdomen is distended, soft and nontender. No organomegaly or masses felt. Normal bowel sounds heard. Central nervous system: Awake and alert.  Moves all extremities. Extremities: No leg edema.  Data Reviewed: I have personally reviewed following labs and imaging studies  CBC: Recent Labs  Lab 10/17/20 0642 10/18/20 0332 10/22/20 2235 10/22/20 2244 10/23/20 0510  WBC 8.0 8.5  --  10.4 9.8  NEUTROABS 6.2 7.0  --  8.6* 8.4*  HGB 11.8* 12.1 12.6 12.6 12.4  HCT RESULTS UNAVAILABLE DUE TO INTERFERING SUBSTANCE 32.3* 37.0 34.0* 33.8*  MCV RESULTS UNAVAILABLE DUE TO INTERFERING SUBSTANCE 88.3  --  88.5 89.7  PLT 153 152  --  216 [id number]   Basic Metabolic Panel: Recent Labs  Lab 10/17/20 0642 10/18/20 0332 10/18/20 1148 10/18/20 1604 10/22/20 2235 10/22/20 2244 10/23/20 0510  NA 128* 128* 129* 127* 127* 130* 127*  K 3.8 3.6 3.4* 3.2* 2.8* 2.8* 3.9  CL  87* 92* 93* 91* 88* 92* 93*  CO2 34* 30 27 25   --  26 25  GLUCOSE 79 76 84 72 77 79 70  BUN 7* 5* 6* 5* 12 14 13   CREATININE 0.55 0.56 0.52 0.55 0.50 0.59 0.46  CALCIUM 8.7* 8.5* 8.6* 8.8*  --  9.2 8.4*  MG 1.6* 2.2  --   --   --  1.7 1.8  PHOS 3.9 4.0  --   --   --   --   --    GFR: Estimated Creatinine Clearance: 65.6 mL/min (by C-G formula based on SCr of 0.46 mg/dL). Liver Function Tests: Recent Labs  Lab 10/17/20 0642 10/18/20 0332 10/22/20 2244 10/23/20 0510  AST 23 20 24 23   ALT 25 21 18 18   ALKPHOS 76 77 83 76  BILITOT 0.8 1.2 1.2 0.9  PROT 5.2* 5.3* 6.0* 5.8*  ALBUMIN 3.0* 2.9* 3.4* 3.3*   No results for input(s): LIPASE, AMYLASE in the last 168 hours. Recent Labs  Lab 10/22/20 2244  10/23/20 0510  AMMONIA 19 31   Coagulation Profile: Recent Labs  Lab 10/17/20 0642  INR 1.0   Cardiac Enzymes: No results for input(s): CKTOTAL, CKMB, CKMBINDEX, TROPONINI in the last 168 hours. BNP (last 3 results) No results for input(s): PROBNP in the last 8760 hours. HbA1C: No results for input(s): HGBA1C in the last 72 hours. CBG: Recent Labs  Lab 10/22/20 2149 10/23/20 0630 10/23/20 1122 10/23/20 1201  GLUCAP 87 77 68* 116*   Lipid Profile: No results for input(s): CHOL, HDL, LDLCALC, TRIG, CHOLHDL, LDLDIRECT in the last 72 hours. Thyroid Function Tests: Recent Labs    10/22/20 2255 10/23/20 0510  TSH 1.340 1.164  FREET4 2.10*  --    Anemia Panel: No results for input(s): VITAMINB12, FOLATE, FERRITIN, TIBC, IRON, RETICCTPCT in the last 72 hours. Urine analysis:    Component Value Date/Time   COLORURINE YELLOW 10/22/2020 2213   APPEARANCEUR CLEAR 10/22/2020 2213   LABSPEC 1.021 10/22/2020 2213   PHURINE 5.0 10/22/2020 2213   GLUCOSEU NEGATIVE 10/22/2020 2213   HGBUR NEGATIVE 10/22/2020 2213   BILIRUBINUR NEGATIVE 10/22/2020 2213   KETONESUR 5 (A) 10/22/2020 2213   PROTEINUR 30 (A) 10/22/2020 2213   NITRITE NEGATIVE 10/22/2020 2213   LEUKOCYTESUR  NEGATIVE 10/22/2020 2213   Sepsis Labs: @LABRCNTIP (procalcitonin:4,lacticidven:4)  ) Recent Results (from the past 240 hour(s))  Resp Panel by RT-PCR (Flu A&B, Covid) Nasopharyngeal Swab     Status: None   Collection Time: 10/23/20  1:22 AM   Specimen: Nasopharyngeal Swab; Nasopharyngeal(NP) swabs in vial transport medium  Result Value Ref Range Status   SARS Coronavirus 2 by RT PCR NEGATIVE NEGATIVE Final    Comment: (NOTE) SARS-CoV-2 target nucleic acids are NOT DETECTED.  The SARS-CoV-2 RNA is generally detectable in upper respiratory specimens during the acute phase of infection. The lowest concentration of SARS-CoV-2 viral copies this assay can detect is 138 copies/mL. A negative result does not preclude SARS-Cov-2 infection and should not be used as the sole basis for treatment or other patient management decisions. A negative result may occur with  improper specimen collection/handling, submission of specimen other than nasopharyngeal swab, presence of viral mutation(s) within the areas targeted by this assay, and inadequate number of viral copies(<138 copies/mL). A negative result must be combined with clinical observations, patient history, and epidemiological information. The expected result is Negative.  Fact Sheet for Patients:  EntrepreneurPulse.com.au  Fact Sheet for Healthcare Providers:  IncredibleEmployment.be  This test is no t yet approved or cleared by the [place] FDA and  has been authorized for detection and/or diagnosis of SARS-CoV-2 by FDA under an Emergency Use Authorization (EUA). This EUA will remain  in effect (meaning this test can be used) for the duration of the COVID-19 declaration under Section 564(b)(1) of the Act, 21 U.S.C.section 360bbb-3(b)(1), unless the authorization is terminated  or revoked sooner.       Influenza A by PCR NEGATIVE NEGATIVE Final   Influenza B by PCR NEGATIVE NEGATIVE Final     Comment: (NOTE) The Xpert Xpress SARS-CoV-2/FLU/RSV plus assay is intended as an aid in the diagnosis of influenza from Nasopharyngeal swab specimens and should not be used as a sole basis for treatment. Nasal washings and aspirates are unacceptable for Xpert Xpress SARS-CoV-2/FLU/RSV testing.  Fact Sheet for Patients: EntrepreneurPulse.com.au  Fact Sheet for Healthcare Providers: IncredibleEmployment.be  This test is not yet approved or cleared by the [place] FDA and has been authorized for detection and/or diagnosis of SARS-CoV-2 by FDA  under an Emergency Use Authorization (EUA). This EUA will remain in effect (meaning this test can be used) for the duration of the COVID-19 declaration under Section 564(b)(1) of the Act, 21 U.S.C. section 360bbb-3(b)(1), unless the authorization is terminated or revoked.  Performed at [facility], [place] [address] [place], [place] [address]          Radiology Studies: CT HEAD WO CONTRAST  Result Date: 10/22/2020 CLINICAL DATA:  Lethargic. EXAM: CT HEAD WITHOUT CONTRAST TECHNIQUE: Contiguous axial images were obtained from the base of the skull through the vertex without intravenous contrast. COMPARISON:  October 11, 2020 FINDINGS: Brain: There is mild cerebral atrophy with widening of the extra-axial spaces and ventricular dilatation. There are areas of decreased attenuation within the white matter tracts of the supratentorial brain, consistent with microvascular disease changes. Vascular: Approximately 7.0 mm x 7.6 mm stable aneurysmal dilatation of the basilar artery is noted. Skull: Normal. Negative for fracture or focal lesion. Sinuses/Orbits: No acute finding. Other: None. IMPRESSION: 1. Generalized cerebral atrophy. 2. No acute intracranial abnormality. 3. Stable aneurysmal dilatation of the basilar artery. Electronically Signed   By: Virgina Norfolk M.D.   On: 10/22/2020 23:46    CT ABDOMEN PELVIS W CONTRAST  Result Date: 10/22/2020 CLINICAL DATA:  Lethargy. EXAM: CT ABDOMEN AND PELVIS WITH CONTRAST TECHNIQUE: Multidetector CT imaging of the abdomen and pelvis was performed using the standard protocol following bolus administration of intravenous contrast. CONTRAST:  123mL OMNIPAQUE IOHEXOL 300 MG/ML  SOLN COMPARISON:  February 07, 2021 FINDINGS: Lower chest: A large area of consolidation is seen within the right lung base. This is increased in size when compared to the prior study. A small right pleural effusion is also seen. This is also increased in size when compared to the prior exam. Hepatobiliary: The liver is cirrhotic in appearance with diffuse fatty infiltration of the liver parenchyma. No focal liver abnormality is seen. Status post cholecystectomy. No biliary dilatation. Pancreas: Unremarkable. No pancreatic ductal dilatation or surrounding inflammatory changes. Spleen: No splenic injury or perisplenic hematoma. Adrenals/Urinary Tract: Adrenal glands are unremarkable. Kidneys are normal in size, without obstructing renal calculi or hydronephrosis. Stable simple cysts are seen within the left kidney. A 4 mm nonobstructing renal stone is seen within the lower pole of the right kidney. Bladder is unremarkable. Stomach/Bowel: Stomach is within normal limits. Appendix appears normal. No evidence of bowel wall thickening, distention, or inflammatory changes. Vascular/Lymphatic: Aortic atherosclerosis. Recanalization of the paraumbilical vein is noted with multiple venous collaterals seen. No enlarged abdominal or pelvic lymph nodes. Reproductive: Uterus and bilateral adnexa are unremarkable. Other: No abdominal wall hernia or abnormality. No abdominopelvic ascites. Musculoskeletal: A chronic anterolateral sixth left rib deformity is seen. Additional chronic fracture deformity is seen involving the right inferior pubic ramus. Compression fracture deformities are seen at the levels  of T9, T10, T12 and L1, with prior vertebroplasty at the levels of T9 and T10. IMPRESSION: 1. Marked severity right lower lobe consolidation, increased in severity when compared to the prior study. 2. Small right pleural effusion. 3. Cirrhosis and portal hypertension. 4. Prior cholecystectomy. 5. Multiple compression fractures within the thoracic and vertebral spine, as described above. Electronically Signed   By: Virgina Norfolk M.D.   On: 10/22/2020 23:56   DG Chest Port 1 View  Result Date: 10/22/2020 CLINICAL DATA:  Lethargic. EXAM: PORTABLE CHEST 1 VIEW COMPARISON:  October 12, 2020 FINDINGS: Chronic appearing increased lung markings are seen without evidence of acute infiltrate, pleural effusion  or pneumothorax. The cardiac silhouette is mildly enlarged. Multiple chronic right-sided rib fractures are seen. A radiopaque right shoulder replacement is noted. There is evidence of prior vertebroplasty within the lower thoracic spine. IMPRESSION: Stable cardiomegaly and chronic changes without acute cardiopulmonary disease. Electronically Signed   By: Virgina Norfolk M.D.   On: 10/22/2020 23:11        Scheduled Meds: . dextrose      . enoxaparin (LOVENOX) injection  40 mg Subcutaneous Q24H  . levothyroxine  50 mcg Intravenous Daily  . metoprolol tartrate  5 mg Intravenous Q6H  . pantoprazole (PROTONIX) IV  40 mg Intravenous Q24H   Continuous Infusions: . azithromycin 500 mg (10/23/20 0847)  . cefTRIAXone (ROCEPHIN)  IV 2 g (10/23/20 0844)     LOS: 0 days    Time spent: No bill.    Dana Allan, MD  [facility] Pager #: [phone] 7PM-7AM contact night coverage as above

## 2020-10-23 NOTE — ED Provider Notes (Signed)
I assumed care of this patient.  Please see previous provider note for further details of Hx, PE.  Briefly patient is a 70 y.o. female who presented presents with lethargy.  Recently discharged for encephalopathy attributed to opiate use and hypoxia which resolved.  Plan is to follow-up labs and reassess.  Chest x-ray similar to previous admission.  Patient was complaining of abdominal pain a CT scan was obtained and noted that right lower lobe opacity was increasing.  On my reassessment patient was sleeping but will arouse to voice.  Oriented x3 but slow to respond.  She was given a dose of Narcan which did not improve her mental status.  Rest of the work-up without evidence of other infection.  Given the worsening right lower lobe consolidation, she was started on Zosyn for possible aspiration pneumonia.  Patient was noted to be hypoxic with sats in the high 80s on room air and placed on supplemental oxygen.  Admitted to medicine for further work-up and management.      Fatima Blank, MD 10/23/20 [phone]

## 2020-10-23 NOTE — Progress Notes (Signed)
RT took the BIPAP off the Pt at 1111 and placed her on 1l and the Pt's SATS are 98%. The Pt told me where she is and asked for water and to be pulled up in the bed. RT will continue to monitor

## 2020-10-23 NOTE — Plan of Care (Signed)
  Problem: Health Behavior/Discharge Planning: °Goal: Ability to manage health-related needs will improve °Outcome: Progressing °  °Problem: Clinical Measurements: °Goal: Ability to maintain clinical measurements within normal limits will improve °Outcome: Progressing °Goal: Diagnostic test results will improve °Outcome: Progressing °  °Problem: Activity: °Goal: Risk for activity intolerance will decrease °Outcome: Progressing °  °Problem: Nutrition: °Goal: Adequate nutrition will be maintained °Outcome: Progressing °  °

## 2020-10-23 NOTE — ED Notes (Signed)
MD notified of pt rectal temperature, see new orders.

## 2020-10-23 NOTE — ED Notes (Signed)
Bear-hugger discontinued due to improvement in patient temperature.

## 2020-10-23 NOTE — H&P (Addendum)
History and Physical    [name] [id number] DOB: [birth year] DOA: 10/22/2020  PCP: [name], MD  Patient coming from: Home.  History obtained from ER physician patient is lethargic.  Chief Complaint: Increasing confusion.  HPI: [name] is a 70 y.o. female with history of liver cirrhosis recently diagnosed with A. fib recent admission for A. fib and compression fracture of the thoracic spine during which patient underwent kyphoplasty has been on narcotic pain relief medication was found to be increasingly confused by family last couple days.  Was brought to the ER.  In the ER patient also was complaining of some abdominal discomfort.  By time I examined the patient patient is more lethargic to give any history.  ED Course: In the ER patient is lethargic but afebrile.  Labs show potassium of 2.8 with CT head unremarkable CT abdomen pelvis shows worsening consolidation in the right lower lung field concerning for pneumonia.  Blood cultures were obtained and started on empiric antibiotics for pneumonia and admitted for further work-up.  COVID test is negative.  On exam patient is lethargic but arousable and following commands.  I have ordered ABG which is pending.  Review of Systems: As per HPI, rest all negative.   Past Medical History:  Diagnosis Date  . ACROCHORDON 03/16/2010  . GI bleeding 06/2018  . Heart murmur   . Hypertension   . Hypothyroidism   . Liver cirrhosis ([place])   . Multiple duodenal ulcers 07/16/2018   associated with melena, anemia, gastritis. due to NSAID.  hx gastric ulcers and bleeding 2004, 2007  . Thyroid disease    S/p Radiation    Past Surgical History:  Procedure Laterality Date  . BIOPSY  07/16/2018   Procedure: BIOPSY;  Surgeon: Mauri Pole, MD;  Location: [facility];  Service: Endoscopy;;  . BREAST EXCISIONAL BIOPSY Left    benign  . CHOLECYSTECTOMY N/A 12/07/2015   Procedure: LAPAROSCOPIC CHOLECYSTECTOMY  WITH INTRAOPERATIVE CHOLANGIOGRAM;  Surgeon: Alphonsa Overall, MD;  Location: [facility];  Service: General;  Laterality: N/A;  . ERCP N/A 12/06/2015   Procedure: ENDOSCOPIC RETROGRADE CHOLANGIOPANCREATOGRAPHY (ERCP);  Surgeon: Milus Banister, MD;  Location: [facility];  Service: Endoscopy;  Laterality: N/A;  . ESOPHAGOGASTRODUODENOSCOPY (EGD) WITH PROPOFOL N/A 07/16/2018   Procedure: ESOPHAGOGASTRODUODENOSCOPY (EGD) WITH PROPOFOL;  Surgeon: Mauri Pole, MD;  Location: [facility];  Service: Endoscopy;  Laterality: N/A;  . IM NAILING TIBIA Right 07/19/2012   DR Maureen Ralphs  . IR VERTEBROPLASTY CERV/THOR BX INC UNI/BIL INC/INJECT/IMAGING  10/17/2020  . IR VERTEBROPLASTY EA ADDL (T&LS) BX INC UNI/BIL INC INJECT/IMAGING  10/17/2020  . SHOULDER HEMI-ARTHROPLASTY Right 07/22/2013   Procedure: RIGHT SHOULDER HEMI-ARTHROPLASTY;  Surgeon: Augustin Schooling, MD;  Location: [facility];  Service: Orthopedics;  Laterality: Right;  . TIBIA IM NAIL INSERTION Right 07/19/2013   Procedure: INTRAMEDULLARY (IM) NAIL TIBIAL;  Surgeon: Gearlean Alf, MD;  Location: [facility];  Service: Orthopedics;  Laterality: Right;  . TUBAL LIGATION       reports that she has been smoking cigarettes. She has a 40.00 pack-year smoking history. She has never used smokeless tobacco. She reports that she does not drink alcohol and does not use drugs.  No Active Allergies  Family History  Problem Relation Age of Onset  . Heart disease Mother   . Lung cancer Father   . Heart disease Brother   . Psoriasis Brother   . Heart disease Brother   . Healthy Daughter   .  Healthy Daughter     Prior to Admission medications   Medication Sig Start Date End Date Taking? Authorizing Provider  acetaminophen (TYLENOL) 500 MG tablet Take 1 tablet (500 mg total) by mouth every 6 (six) hours as needed for mild pain, fever or headache. 10/18/20 11/17/20  Elodia Florence., MD  Ascorbic Acid (VITAMIN C) 100 MG tablet Take 100 mg by mouth daily.     [provider]  B Complex Vitamins (B COMPLEX 1 PO) Take 1 tablet by mouth daily.    [provider]  Bilberry, Vaccinium myrtillus, (BILBERRY FRUIT PO) Take 1 tablet by mouth daily.    [provider]  calcium carbonate (OSCAL) 1500 (600 Ca) MG TABS tablet Take 600 mg of elemental calcium by mouth daily with breakfast.    [provider]  citalopram (CELEXA) 20 MG tablet TAKE 1 TABLET BY MOUTH EVERY DAY. Patient taking differently: Take 20 mg by mouth daily. 03/17/14   Patrecia Pour, MD  diclofenac Sodium (VOLTAREN) 1 % GEL Apply 2 g topically 4 (four) times daily as needed (pain). 10/18/20   Elodia Florence., MD  diltiazem (CARDIZEM CD) 120 MG 24 hr capsule Take 1 capsule (120 mg total) by mouth daily. 10/19/20 11/18/20  Elodia Florence., MD  ferrous sulfate 325 (65 FE) MG EC tablet Take 1 tablet (325 mg total) by mouth 3 (three) times daily with meals for 30 days. Patient not taking: Reported on 10/09/2020 07/18/18 08/17/18  Gerlean Ren Chirag, MD  Flaxseed, Linseed, (FLAX SEED OIL PO) Take 1 tablet by mouth daily.    [provider]  folic acid (FOLVITE) Q000111Q MCG tablet Take 400 mcg by mouth daily.    [provider]  hydrALAZINE (APRESOLINE) 25 MG tablet Take 3 tablets (75 mg total) by mouth 3 (three) times daily. 10/18/20 11/17/20  Elodia Florence., MD  levothyroxine (SYNTHROID) 100 MCG tablet Take 1 tablet (100 mcg total) by mouth daily at 6 (six) AM. Please follow up repeat labs in 4-6 weeks with PCP. 10/19/20 11/18/20  Elodia Florence., MD  lisinopril (ZESTRIL) 40 MG tablet Take 40 mg by mouth daily.    [provider]  LUTEIN PO Take 1 tablet by mouth daily.    [provider]  Multiple Vitamin (MULTIVITAMIN WITH MINERALS) TABS tablet Take 1 tablet by mouth daily.    [provider]  Multiple Vitamins-Minerals (MULTIVITAMIN & MINERAL PO) Take 1 tablet by mouth daily.    [provider]   multivitamin-lutein (OCUVITE-LUTEIN) CAPS capsule Take 1 capsule by mouth daily.    [provider]  omega-3 acid ethyl esters (LOVAZA) 1 g capsule Take 1 capsule (1 g total) by mouth daily. 10/18/20 11/17/20  Elodia Florence., MD  Omega-3 Fatty Acids (FISH OIL CONCENTRATE) 1000 MG CAPS Take 1,000 mg by mouth daily.    [provider]  pantoprazole (PROTONIX) 40 MG tablet Take 1 tablet (40 mg total) by mouth at bedtime. 10/18/20 11/17/20  Elodia Florence., MD  VITAMIN E PO Take 1 tablet by mouth daily.    [provider]    Physical Exam: Constitutional: Moderately built and nourished. Vitals:   10/23/20 0215 10/23/20 0228 10/23/20 0230 10/23/20 0346  BP: 129/64  131/88 131/86  Pulse: (!) 115  (!) 107 95  Resp: (!) 23  (!) 23 20  Temp:  97.6 F (36.4 C)  98.4 F (36.9 C)  TempSrc:  Rectal  Oral  SpO2: 99%  98% 100%  Weight:      Height:       Eyes: Anicteric no pallor. ENMT: No discharge from the ears eyes nose or mouth. Neck: No mass felt.  No neck rigidity. Respiratory: No rhonchi or crepitations. Cardiovascular: S1-S2 heard. Abdomen: Soft nontender bowel sounds present. Musculoskeletal: No edema. Skin: No rash. Neurologic: Lethargic arousable pupils reactive to light.  Psychiatric: Lethargic.   Labs on Admission: I have personally reviewed following labs and imaging studies  CBC: Recent Labs  Lab 10/16/20 0908 10/17/20 0642 10/18/20 0332 10/22/20 2235 10/22/20 2244  WBC 10.5 8.0 8.5  --  10.4  NEUTROABS  --  6.2 7.0  --  8.6*  HGB 12.1 11.8* 12.1 12.6 12.6  HCT RESULTS UNAVAILABLE DUE TO INTERFERING SUBSTANCE RESULTS UNAVAILABLE DUE TO INTERFERING SUBSTANCE 32.3* 37.0 34.0*  MCV RESULTS UNAVAILABLE DUE TO INTERFERING SUBSTANCE RESULTS UNAVAILABLE DUE TO INTERFERING SUBSTANCE 88.3  --  88.5  PLT 182 153 152  --  433   Basic Metabolic Panel: Recent Labs  Lab 10/17/20 0642 10/18/20 0332 10/18/20 1148 10/18/20 1604  10/22/20 2235 10/22/20 2244  NA 128* 128* 129* 127* 127* 130*  K 3.8 3.6 3.4* 3.2* 2.8* 2.8*  CL 87* 92* 93* 91* 88* 92*  CO2 34* 30 27 25   --  26  GLUCOSE 79 76 84 72 77 79  BUN 7* 5* 6* 5* 12 14  CREATININE 0.55 0.56 0.52 0.55 0.50 0.59  CALCIUM 8.7* 8.5* 8.6* 8.8*  --  9.2  MG 1.6* 2.2  --   --   --  1.7  PHOS 3.9 4.0  --   --   --   --    GFR: Estimated Creatinine Clearance: 65.6 mL/min (by C-G formula based on SCr of 0.59 mg/dL). Liver Function Tests: Recent Labs  Lab 10/17/20 0642 10/18/20 0332 10/22/20 2244  AST 23 20 24   ALT 25 21 18   ALKPHOS 76 77 83  BILITOT 0.8 1.2 1.2  PROT 5.2* 5.3* 6.0*  ALBUMIN 3.0* 2.9* 3.4*   No results for input(s): LIPASE, AMYLASE in the last 168 hours. Recent Labs  Lab 10/22/20 2244  AMMONIA 19   Coagulation Profile: Recent Labs  Lab 10/17/20 0642  INR 1.0   Cardiac Enzymes: No results for input(s): CKTOTAL, CKMB, CKMBINDEX, TROPONINI in the last 168 hours. BNP (last 3 results) No results for input(s): PROBNP in the last 8760 hours. HbA1C: No results for input(s): HGBA1C in the last 72 hours. CBG: Recent Labs  Lab 10/22/20 2149  GLUCAP 87   Lipid Profile: No results for input(s): CHOL, HDL, LDLCALC, TRIG, CHOLHDL, LDLDIRECT in the last 72 hours. Thyroid Function Tests: Recent Labs    10/22/20 2255  TSH 1.340   Anemia Panel: No results for input(s): VITAMINB12, FOLATE, FERRITIN, TIBC, IRON, RETICCTPCT in the last 72 hours. Urine analysis:    Component Value Date/Time   COLORURINE YELLOW 10/22/2020 2213   APPEARANCEUR CLEAR 10/22/2020 2213   LABSPEC 1.021 10/22/2020 2213   PHURINE 5.0 10/22/2020 2213   GLUCOSEU NEGATIVE 10/22/2020 2213   HGBUR NEGATIVE 10/22/2020 2213   BILIRUBINUR NEGATIVE 10/22/2020 2213   KETONESUR 5 (A) 10/22/2020 2213   PROTEINUR 30 (A) 10/22/2020 2213   NITRITE NEGATIVE 10/22/2020 2213   LEUKOCYTESUR NEGATIVE 10/22/2020 2213   Sepsis  Labs: @LABRCNTIP (procalcitonin:4,lacticidven:4) ) Recent Results (from the past 240 hour(s))  Resp Panel by RT-PCR (Flu A&B, Covid) Nasopharyngeal Swab     Status: None  Collection Time: 10/23/20  1:22 AM   Specimen: Nasopharyngeal Swab; Nasopharyngeal(NP) swabs in vial transport medium  Result Value Ref Range Status   SARS Coronavirus 2 by RT PCR NEGATIVE NEGATIVE Final    Comment: (NOTE) SARS-CoV-2 target nucleic acids are NOT DETECTED.  The SARS-CoV-2 RNA is generally detectable in upper respiratory specimens during the acute phase of infection. The lowest concentration of SARS-CoV-2 viral copies this assay can detect is 138 copies/mL. A negative result does not preclude SARS-Cov-2 infection and should not be used as the sole basis for treatment or other patient management decisions. A negative result may occur with  improper specimen collection/handling, submission of specimen other than nasopharyngeal swab, presence of viral mutation(s) within the areas targeted by this assay, and inadequate number of viral copies(<138 copies/mL). A negative result must be combined with clinical observations, patient history, and epidemiological information. The expected result is Negative.  Fact Sheet for Patients:  EntrepreneurPulse.com.au  Fact Sheet for Healthcare Providers:  IncredibleEmployment.be  This test is no t yet approved or cleared by the [place] FDA and  has been authorized for detection and/or diagnosis of SARS-CoV-2 by FDA under an Emergency Use Authorization (EUA). This EUA will remain  in effect (meaning this test can be used) for the duration of the COVID-19 declaration under Section 564(b)(1) of the Act, 21 U.S.C.section 360bbb-3(b)(1), unless the authorization is terminated  or revoked sooner.       Influenza A by PCR NEGATIVE NEGATIVE Final   Influenza B by PCR NEGATIVE NEGATIVE Final    Comment: (NOTE) The Xpert Xpress  SARS-CoV-2/FLU/RSV plus assay is intended as an aid in the diagnosis of influenza from Nasopharyngeal swab specimens and should not be used as a sole basis for treatment. Nasal washings and aspirates are unacceptable for Xpert Xpress SARS-CoV-2/FLU/RSV testing.  Fact Sheet for Patients: EntrepreneurPulse.com.au  Fact Sheet for Healthcare Providers: IncredibleEmployment.be  This test is not yet approved or cleared by the [place] FDA and has been authorized for detection and/or diagnosis of SARS-CoV-2 by FDA under an Emergency Use Authorization (EUA). This EUA will remain in effect (meaning this test can be used) for the duration of the COVID-19 declaration under Section 564(b)(1) of the Act, 21 U.S.C. section 360bbb-3(b)(1), unless the authorization is terminated or revoked.  Performed at [facility], [place] [address] [place], [place] [address]      Radiological Exams on Admission: CT HEAD WO CONTRAST  Result Date: 10/22/2020 CLINICAL DATA:  Lethargic. EXAM: CT HEAD WITHOUT CONTRAST TECHNIQUE: Contiguous axial images were obtained from the base of the skull through the vertex without intravenous contrast. COMPARISON:  October 11, 2020 FINDINGS: Brain: There is mild cerebral atrophy with widening of the extra-axial spaces and ventricular dilatation. There are areas of decreased attenuation within the white matter tracts of the supratentorial brain, consistent with microvascular disease changes. Vascular: Approximately 7.0 mm x 7.6 mm stable aneurysmal dilatation of the basilar artery is noted. Skull: Normal. Negative for fracture or focal lesion. Sinuses/Orbits: No acute finding. Other: None. IMPRESSION: 1. Generalized cerebral atrophy. 2. No acute intracranial abnormality. 3. Stable aneurysmal dilatation of the basilar artery. Electronically Signed   By: Virgina Norfolk M.D.   On: 10/22/2020 23:46   CT ABDOMEN PELVIS W  CONTRAST  Result Date: 10/22/2020 CLINICAL DATA:  Lethargy. EXAM: CT ABDOMEN AND PELVIS WITH CONTRAST TECHNIQUE: Multidetector CT imaging of the abdomen and pelvis was performed using the standard protocol following bolus administration of intravenous contrast. CONTRAST:  137mL OMNIPAQUE IOHEXOL 300 MG/ML  SOLN COMPARISON:  February 07, 2021 FINDINGS: Lower chest: A large area of consolidation is seen within the right lung base. This is increased in size when compared to the prior study. A small right pleural effusion is also seen. This is also increased in size when compared to the prior exam. Hepatobiliary: The liver is cirrhotic in appearance with diffuse fatty infiltration of the liver parenchyma. No focal liver abnormality is seen. Status post cholecystectomy. No biliary dilatation. Pancreas: Unremarkable. No pancreatic ductal dilatation or surrounding inflammatory changes. Spleen: No splenic injury or perisplenic hematoma. Adrenals/Urinary Tract: Adrenal glands are unremarkable. Kidneys are normal in size, without obstructing renal calculi or hydronephrosis. Stable simple cysts are seen within the left kidney. A 4 mm nonobstructing renal stone is seen within the lower pole of the right kidney. Bladder is unremarkable. Stomach/Bowel: Stomach is within normal limits. Appendix appears normal. No evidence of bowel wall thickening, distention, or inflammatory changes. Vascular/Lymphatic: Aortic atherosclerosis. Recanalization of the paraumbilical vein is noted with multiple venous collaterals seen. No enlarged abdominal or pelvic lymph nodes. Reproductive: Uterus and bilateral adnexa are unremarkable. Other: No abdominal wall hernia or abnormality. No abdominopelvic ascites. Musculoskeletal: A chronic anterolateral sixth left rib deformity is seen. Additional chronic fracture deformity is seen involving the right inferior pubic ramus. Compression fracture deformities are seen at the levels of T9, T10, T12 and L1,  with prior vertebroplasty at the levels of T9 and T10. IMPRESSION: 1. Marked severity right lower lobe consolidation, increased in severity when compared to the prior study. 2. Small right pleural effusion. 3. Cirrhosis and portal hypertension. 4. Prior cholecystectomy. 5. Multiple compression fractures within the thoracic and vertebral spine, as described above. Electronically Signed   By: Virgina Norfolk M.D.   On: 10/22/2020 23:56   DG Chest Port 1 View  Result Date: 10/22/2020 CLINICAL DATA:  Lethargic. EXAM: PORTABLE CHEST 1 VIEW COMPARISON:  October 12, 2020 FINDINGS: Chronic appearing increased lung markings are seen without evidence of acute infiltrate, pleural effusion or pneumothorax. The cardiac silhouette is mildly enlarged. Multiple chronic right-sided rib fractures are seen. A radiopaque right shoulder replacement is noted. There is evidence of prior vertebroplasty within the lower thoracic spine. IMPRESSION: Stable cardiomegaly and chronic changes without acute cardiopulmonary disease. Electronically Signed   By: Virgina Norfolk M.D.   On: 10/22/2020 23:11    EKG: Independently reviewed.  A. fib rate of 105 bpm.  Assessment/Plan Principal Problem:   Acute encephalopathy Active Problems:   HYPERTENSION, BENIGN ESSENTIAL   CIRRHOSIS, ALCOHOLIC, LIVER   Essential hypertension   Pneumonia    1. Acute encephalopathy could be multifactorial primary concerning for narcotic use.  Given the history of liver cirrhosis will check ammonia levels I have ordered ABG to see if there is any carbon dioxide retention.  We will keep patient n.p.o. for now and if ABG is unremarkable ammonia is normal we will look for other causes. 2. Worsening pneumonia on the CAT scan for which patient is on antibiotics.  Follow cultures.  COVID test is negative. 3. A. fib rate is around 100 bpm.  Patient takes Cardizem at home.  Since patient is n.p.o. I have placed patient on scheduled dose of IV metoprolol  until patient can take oral Cardizem.  Patient was felt not a good candidate for anticoagulation. 4. Diastolic dysfunction per recent 2D echo.  Closely monitor respiratory status. 5. Hypothyroidism on Synthroid dose was recently decreased.  I have dosed Synthroid as IV  for now until patient can take orally.  TSH is 1.34. 6. Cirrhosis of the liver with portal hypertension seen in the CAT scan.  CT abdomen pelvis does not mention about any ascites.  Patient is on empirically antibiotics for pneumonia.  Check ammonia levels. 7. Recent kyphoplasty for thoracic spine fracture.  Holding any pain medications sedatives for now. 8. History of hypertension presently on scheduled dose of IV metoprolol. 9. Hypokalemia -replace and recheck.  Check magnesium levels.  Cause of hypokalemia not clear.  Since patient has acute encephalopathy with worsening pneumonia will need inpatient status.   DVT prophylaxis: Lovenox. Code Status: Full code. Family Communication: We will need to discuss with family. Disposition Plan: Home when stable. Consults called: None. Admission status: Inpatient.   Rise Patience MD [facility] Pager [phone].  If 7PM-7AM, please contact night-coverage [email or web address] Password [facility]  10/23/2020, 4:44 AM

## 2020-10-24 ENCOUNTER — Inpatient Hospital Stay (HOSPITAL_COMMUNITY): Payer: Medicare Other

## 2020-10-24 DIAGNOSIS — G9340 Encephalopathy, unspecified: Principal | ICD-10-CM

## 2020-10-24 LAB — BASIC METABOLIC PANEL
Anion gap: 7 (ref 5–15)
BUN: 15 mg/dL (ref 8–23)
CO2: 27 mmol/L (ref 22–32)
Calcium: 8.5 mg/dL — ABNORMAL LOW (ref 8.9–10.3)
Chloride: 97 mmol/L — ABNORMAL LOW (ref 98–111)
Creatinine, Ser: 0.5 mg/dL (ref 0.44–1.00)
GFR, Estimated: >60 mL/min (ref >60)
Glucose, Bld: 110 mg/dL — ABNORMAL HIGH (ref 70–99)
Potassium: 4 mmol/L (ref 3.5–5.1)
Sodium: 131 mmol/L — ABNORMAL LOW (ref 135–145)

## 2020-10-24 LAB — GLUCOSE, CAPILLARY
Glucose-Capillary: 100 mg/dL — ABNORMAL HIGH (ref 70–99)
Glucose-Capillary: 125 mg/dL — ABNORMAL HIGH (ref 70–99)

## 2020-10-24 MED ORDER — LIDOCAINE 5 % EX PTCH
1.0000 | MEDICATED_PATCH | CUTANEOUS | Status: DC
  Administered 2020-10-24 – 2020-10-27 (×4): 1 via TRANSDERMAL
  Filled 2020-10-24 (×4): qty 1

## 2020-10-24 MED ORDER — IPRATROPIUM-ALBUTEROL 0.5-2.5 (3) MG/3ML IN SOLN
3.0000 mL | Freq: Three times a day (TID) | RESPIRATORY_TRACT | Status: DC
  Administered 2020-10-24 – 2020-10-27 (×10): 3 mL via RESPIRATORY_TRACT
  Filled 2020-10-24 (×10): qty 3

## 2020-10-24 NOTE — Evaluation (Signed)
Physical Therapy Evaluation Patient Details Name: [name] MRN: [id number] DOB: Aug 14, 1950 Today's Date: 10/24/2020   History of Present Illness  Patient is a 70 y/o female admitted for Acute encephalopathy: possibly multifactorial, Most likely associated with narcotic use that she was taking for recent kyphoplasty.  Recent admission and found to have T10, T8 T12 and L1 compression fractures, s/p vertebroplasty 10/17/20.  PMH includes liver cirrhosis secondary to alcohol abuse, HTN.  Clinical Impression  Pt admitted with above diagnosis. Pt currently with functional limitations due to the deficits listed below (see PT Problem List). Pt will benefit from skilled PT to increase their independence and safety with mobility to allow discharge to the venue listed below.  Pt assisted to [facility] and then recliner. Pt requiring at least mod assist for transfers at this time.  Pt reports her daughter has been assisting her since return home after recent admission (had KP).  Pt states she has had w/c at home so her daughter would assist her to w/c.  Pt may benefit from SNF upon d/c due to increased assist (if daughter is unable to continue providing assist or this assist level).  Pt does not recall receiving HHPT prior to this admission.    Follow Up Recommendations SNF    Equipment Recommendations  None recommended by PT    Recommendations for Other Services       Precautions / Restrictions Precautions Precautions: Back Precaution Comments: reviewed back precautions      Mobility  Bed Mobility Overal bed mobility: Needs Assistance Bed Mobility: Rolling;Sidelying to Sit Rolling: Min guard Sidelying to sit: Min assist       General bed mobility comments: Cues for log roll technique with use of rail for support, increased time, light assist for trunk.    Transfers Overall transfer level: Needs assistance Equipment used: Rolling walker (2 wheeled) Transfers: Sit to/from Stand Sit to  Stand: Mod assist         General transfer comment: mod assist to rise and steady, cues for hand placement, stand pivot to [facility], pt required assist for pericare in seated position, pt assisted to standing and switched out [facility] for recliner as pt very fatigued and weak  Ambulation/Gait             General Gait Details: pt felt unable at this time  Stairs            Wheelchair Mobility    Modified Rankin (Stroke Patients Only)       Balance Overall balance assessment: History of Falls;Needs assistance         Standing balance support: Bilateral upper extremity supported Standing balance-Leahy Scale: Poor Standing balance comment: reliant on RW and assist                             Pertinent Vitals/Pain Pain Assessment: Faces Faces Pain Scale: Hurts little more Pain Location: abdomen Pain Descriptors / Indicators: Sore;Aching Pain Intervention(s): Repositioned;Monitored during session    Home Living Family/patient expects to be discharged to:: Private residence Living Arrangements: Children (daughter) Available Help at Discharge: Family;Available 24 hours/day Type of Home: House Home Access: Stairs to enter Entrance Stairs-Rails: Right Entrance Stairs-Number of Steps: 5-6 Home Layout: One level Home Equipment: Cane - single point;Walker - 2 wheels;Shower seat;Grab bars - tub/shower;Wheelchair - manual      Prior Function Level of Independence: Needs assistance   Gait / Transfers Assistance Needed: Uses SPC for ambulation  prior to KP, pt reports daughter assists her OOB to w/c since return home from admission with KP  ADL's / Homemaking Assistance Needed: typically Ind with selfcare/ADLs however more assist after previous admission. Daughter does IADLs and cooking/driving.        Hand Dominance   Dominant Hand: Right    Extremity/Trunk Assessment   Upper Extremity Assessment Upper Extremity Assessment: RUE deficits/detail RUE  Deficits / Details: very limited shoulder AROM, pt self assists R UE to place hand to RW    Lower Extremity Assessment Lower Extremity Assessment: Generalized weakness       Communication   Communication: No difficulties  Cognition Arousal/Alertness: Awake/alert Behavior During Therapy: WFL for tasks assessed/performed Overall Cognitive Status: No family/caregiver present to determine baseline cognitive functioning                               Problem Solving: Slow processing;Requires verbal cues;Difficulty sequencing General Comments: pt answering questions appropriately, appears a little groggy but awake      General Comments      Exercises     Assessment/Plan    PT Assessment Patient needs continued PT services  PT Problem List Decreased strength;Decreased mobility;Decreased balance;Decreased activity tolerance;Cardiopulmonary status limiting activity;Decreased knowledge of precautions;Decreased knowledge of use of DME       PT Treatment Interventions Gait training;Patient/family education;Therapeutic activities;Functional mobility training;Stair training;Balance training;Therapeutic exercise;DME instruction    PT Goals (Current goals can be found in the Care Plan section)  Acute Rehab PT Goals PT Goal Formulation: With patient Time For Goal Achievement: 11/07/20 Potential to Achieve Goals: Good    Frequency Min 3X/week   Barriers to discharge        Co-evaluation               AM-PAC PT "6 Clicks" Mobility  Outcome Measure Help needed turning from your back to your side while in a flat bed without using bedrails?: A Little Help needed moving from lying on your back to sitting on the side of a flat bed without using bedrails?: A Little Help needed moving to and from a bed to a chair (including a wheelchair)?: A Lot Help needed standing up from a chair using your arms (e.g., wheelchair or bedside chair)?: A Lot Help needed to walk in  hospital room?: A Lot Help needed climbing 3-5 steps with a railing? : Total 6 Click Score: 13    End of Session Equipment Utilized During Treatment: Gait belt Activity Tolerance: Patient limited by fatigue Patient left: in chair;with call bell/phone within reach (aware to use call bell, nurse tech aware pt up in recliner) Nurse Communication: Mobility status PT Visit Diagnosis: Difficulty in walking, not elsewhere classified (R26.2);Muscle weakness (generalized) (M62.81)    Time: 6789-3810 PT Time Calculation (min) (ACUTE ONLY): 26 min   Charges:   PT Evaluation $PT Eval Low Complexity: 1 Low PT Treatments $Therapeutic Activity: 8-22 mins       Jannette Spanner PT, DPT Acute Rehabilitation Services Pager: [phone] Office: [phone]  York Ram E 10/24/2020, 4:11 PM

## 2020-10-24 NOTE — Evaluation (Signed)
Clinical/Bedside Swallow Evaluation Patient Details  Name: [name] MRN: [id number] Date of Birth: January 20, 1951  Today's Date: 10/24/2020 Time: SLP Start Time (ACUTE ONLY): 0845 SLP Stop Time (ACUTE ONLY): 0905 SLP Time Calculation (min) (ACUTE ONLY): 20 min  Past Medical History:  Past Medical History:  Diagnosis Date  . A[place]ON 03/16/2010  . GI bleeding 06/2018  . Heart murmur   . Hypertension   . Hypothyroidism   . Liver cirrhosis ([place])   . Multiple duodenal ulcers 07/16/2018   associated with melena, anemia, gastritis. due to NSAID.  hx gastric ulcers and bleeding 2004, 2007  . Thyroid disease    S/p Radiation   Past Surgical History:  Past Surgical History:  Procedure Laterality Date  . BIOPSY  07/16/2018   Procedure: BIOPSY;  Surgeon: Mauri Pole, MD;  Location: [facility];  Service: Endoscopy;;  . BREAST EXCISIONAL BIOPSY Left    benign  . CHOLECYSTECTOMY N/A 12/07/2015   Procedure: LAPAROSCOPIC CHOLECYSTECTOMY WITH INTRAOPERATIVE CHOLANGIOGRAM;  Surgeon: Alphonsa Overall, MD;  Location: [facility];  Service: General;  Laterality: N/A;  . ERCP N/A 12/06/2015   Procedure: ENDOSCOPIC RETROGRADE CHOLANGIOPANCREATOGRAPHY (ERCP);  Surgeon: Milus Banister, MD;  Location: [facility];  Service: Endoscopy;  Laterality: N/A;  . ESOPHAGOGASTRODUODENOSCOPY (EGD) WITH PROPOFOL N/A 07/16/2018   Procedure: ESOPHAGOGASTRODUODENOSCOPY (EGD) WITH PROPOFOL;  Surgeon: Mauri Pole, MD;  Location: [facility] [place] [facility];  Service: Endoscopy;  Laterality: N/A;  . IM NAILING TIBIA Right 07/19/2012   DR Maureen Ralphs  . IR VERTEBROPLASTY CERV/THOR BX INC UNI/BIL INC/INJECT/IMAGING  10/17/2020  . IR VERTEBROPLASTY EA ADDL (T&LS) BX INC UNI/BIL INC INJECT/IMAGING  10/17/2020  . SHOULDER HEMI-ARTHROPLASTY Right 07/22/2013   Procedure: RIGHT SHOULDER HEMI-ARTHROPLASTY;  Surgeon: Augustin Schooling, MD;  Location: [place] [facility];  Service: Orthopedics;  Laterality: Right;  . TIBIA IM NAIL INSERTION Right  07/19/2013   Procedure: INTRAMEDULLARY (IM) NAIL TIBIAL;  Surgeon: Gearlean Alf, MD;  Location: [facility];  Service: Orthopedics;  Laterality: Right;  . TUBAL LIGATION     HPI:  Patient is a 70 y.o. female with PMH: liver cirrhosis, recently diagnosed with afib and compression fracture of thoracic spince during which patient underwent kyphoplasty and has been on narcotic pain relief medication, EGD in 2020 found multiple partially obstructing duodenal ulcers with adherent clot; suspicious for NSAID induced etiology. She was brought to ER secondary to family observing increasing confusion. In ER, patient was c/o abdominal discomfort and found to be lethargic when MD was examining her. CT head was unremarkable but CT abdomen, pelvis showed worsening consolidation  in right lower lung field concerning for PNA. COVID test negative.   Assessment / Plan / Recommendation Clinical Impression  Patient presents with what appears to be a mild pharyngeal and suspected esophageal dysphagia. Swallow initiation appeared timely with straw sips of thin liquids (water) and spoon bites of puree (applesauce). Patient with immediate and delayed throat clearing as well as two intances of belching, however vocal quality not observed to change significantly. Patient with h/o esophageal related issues with 2020 EGD revealing multiple partially obstructing duodenal ulcers with adherent clot; suspicious for NSAID induced etiology. SLP is recommending to proceed with MBS to r/o aspiration and to fully assess her oropharyngeal swallow function. SLP Visit Diagnosis: Dysphagia, unspecified (R13.10)    Aspiration Risk  Mild aspiration risk    Diet Recommendation Regular;Thin liquid   Liquid Administration via: Cup;Straw Medication Administration: Whole meds with liquid Supervision: Full supervision/cueing for compensatory strategies;Staff to assist with self  feeding Compensations: Slow rate;Small sips/bites Postural Changes:  Seated upright at 90 degrees;Remain upright for at least 30 minutes after po intake    Other  Recommendations Oral Care Recommendations: Oral care BID   Follow up Recommendations Other (comment) (TBD pending MBS results)      Frequency and Duration min 1 x/week  1 week       Prognosis Prognosis for Safe Diet Advancement: Good      Swallow Study   General Date of Onset: 10/23/20 HPI: Patient is a 70 y.o. female with PMH: liver cirrhosis, recently diagnosed with afib and compression fracture of thoracic spince during which patient underwent kyphoplasty and has been on narcotic pain relief medication, EGD in 2020 found multiple partially obstructing duodenal ulcers with adherent clot; suspicious for NSAID induced etiology. She was brought to ER secondary to family observing increasing confusion. In ER, patient was c/o abdominal discomfort and found to be lethargic when MD was examining her. CT head was unremarkable but CT abdomen, pelvis showed worsening consolidation  in right lower lung field concerning for PNA. COVID test negative. Type of Study: Bedside Swallow Evaluation Previous Swallow Assessment: None found Diet Prior to this Study: Regular;Thin liquids Temperature Spikes Noted: No Respiratory Status: Nasal cannula History of Recent Intubation: No Behavior/Cognition: Cooperative;Lethargic/Drowsy;Alert Oral Cavity Assessment: Within Functional Limits Oral Care Completed by SLP: Yes Oral Cavity - Dentition: Adequate natural dentition Vision: Functional for self-feeding Self-Feeding Abilities: Total assist Patient Positioning: Upright in bed Baseline Vocal Quality: Normal Volitional Cough: Strong Volitional Swallow: Unable to elicit    Oral/Motor/Sensory Function Overall Oral Motor/Sensory Function: Within functional limits   Ice Chips   NT  Thin Liquid Thin Liquid: Impaired Pharyngeal  Phase Impairments: Throat Clearing - Immediate;Throat Clearing - Delayed    Nectar Thick    NT  Honey Thick   NT  Puree Puree: Within functional limits Presentation: Spoon   Solid     Solid: Not tested Other Comments: patient not alert enough for regular texture PO's     Sonia Baller, MA, CCC-SLP Speech Therapy

## 2020-10-24 NOTE — Progress Notes (Signed)
Modified Barium Swallow Progress Note  Patient Details  Name: [name] MRN: [id number] Date of Birth: Feb 22, 1951  Today's Date: 10/24/2020  Modified Barium Swallow completed.  Full report located under Chart Review in the Imaging Section.  Brief recommendations include the following:  Clinical Impression  Patient presents with a mild oropharyngeal dysphagia but without aspiration. During oral phase of swallow, patient exhibited mild mastication and transit of bolus delays with regular solids as well as difficulty keeping bolus of thin liquid barium and barium tablet together. When give puree solids (applesauce),barium tablet then transited orally and pharyngeally without difficulty. Swallow initiation delay at level of vallecular sinus was observed with cup sips thin liquids and to vallecular heading into pyriform sinus delay with straw sips thin liquids. In addition, patient exhibited trace flash penetration of thin liquids during straw sips but which cleared fully during swallow. Patient did have some periodic throat clearing which may have been due to the trace vallecular residuals post initial swallows but likely related to h/o GERD and esophageal dysfunction. Cervical esophageal phase of swallow revealed prominent cricopharyngeal bar, however this did not appear to impact patient's swallow function significantly. SLP recommending to continue with Regular solids, thin liquids diet.   Swallow Evaluation Recommendations       SLP Diet Recommendations: Regular solids;Thin liquid   Liquid Administration via: Cup;Straw   Medication Administration: Whole meds with puree   Supervision: Staff to assist with self feeding   Compensations: Slow rate;Small sips/bites       Oral Care Recommendations: Oral care BID       Sonia Baller, MA, CCC-SLP Speech Therapy

## 2020-10-24 NOTE — Progress Notes (Signed)
PROGRESS NOTE    [name]  BHA:[id number] DOB: [birth year] DOA: 10/22/2020 PCP: [name], MD   Chief Complain: Confusion  Brief Narrative: Patient is a 70 female with history of liver cirrhosis, recently diagnosed permanent A. fib, compression fracture of the thoracic spine status post kyphoplasty who was brought to the emergency department with complaints of confusion.  She was lethargic on presentation.  Her lab work showed severe hypokalemia.  CT head was unremarkable.  CT abdomen/pelvis showed consolidation in the right lower lung field concerning for pneumonia.  She was started on IV antibiotics for possible community-acquired pneumonia.  Currently mental status has improved.  PT/OT being consulted  Assessment & Plan:   Principal Problem:   Acute encephalopathy Active Problems:   HYPERTENSION, BENIGN ESSENTIAL   CIRRHOSIS, ALCOHOLIC, LIVER   Essential hypertension   Pneumonia   Acute encephalopathy: Currently alert and oriented but appears little foggy.  Encephalopathy could be multifactorial.  Most likely associated with narcotic use that she was taking for recent kyphoplasty.  Emina levels were normal.  ABG did not show any hypercarbia.  Right lower lobe consolidation/suspected pneumonia: Currently respiratory status is stable.  Started on ceftriaxone azithromycin.  CT abdomen pelvis showed some consolidation of the right lower lung field, chest x-ray done did not show pneumonia.  Continue current antibiotics for now.  Aspiration was suspected, speech therapy consulted, underwent MBS without finding of any aspiration.  Procalcitonin reassuring.  We will send antibiotics to oral on discharge.  Currently she is on room air.  Permanent A. fib: Continue Cardizem.  Not on anticoagulation because she was thought to be not a good candidate for that.  We recommend to follow-up with cardiology as an outpatient.  History of diastolic congestive heart failure: Currently  euvolemic  Cirrhosis: Has history of liver cirrhosis with portal hypertension.  No finding of ascites as per CT abdomen/pelvis.  Ammonia level in the range of 30.  Needs to follow-up with GI as an outpatient.  Recent kyphoplasty for thoracic spine fracture: Taking opioids at home for pain relief.  Will request for PT/OT evaluation.  Will recommend to minimize narcotics  Hypertension: Currently blood pressure stable.  Continue to monitor  Hypomagnesemia: Supplemented and corrected  Chronic hyponatremia: Stable.  Urine sodium found to be less than 10.  Normal urine /plasma osmolality         DVT prophylaxis:Lovenox Code Status:  Family Communication: Called and discussed with daughter on phone on 10/24/20 Status is: Inpatient  Remains inpatient appropriate because:Inpatient level of care appropriate due to severity of illness   Dispo: The patient is from: Home              Anticipated d/c is to: Home              Patient currently is not medically stable to d/c.   Difficult to place patient No    Consultants: None  Procedures:None  Antimicrobials:  Anti-infectives (From admission, onward)   Start     Dose/Rate Route Frequency Ordered Stop   10/23/20 0800  cefTRIAXone (ROCEPHIN) 2 g in sodium chloride 0.9 % 100 mL IVPB        2 g 200 mL/hr over 30 Minutes Intravenous Every 24 hours 10/23/20 0443 10/28/20 0759   10/23/20 0800  azithromycin (ZITHROMAX) 500 mg in sodium chloride 0.9 % 250 mL IVPB        500 mg 250 mL/hr over 60 Minutes Intravenous Every 24 hours 10/23/20 0443 10/28/20 0759  10/23/20 0130  piperacillin-tazobactam (ZOSYN) IVPB 3.375 g        3.375 g 100 mL/hr over 30 Minutes Intravenous  Once 10/23/20 0129 10/23/20 0237      Subjective:  Patient seen and examined the bedside this afternoon.  She was on room air.C/O of back pain, some shortness of breath.  No chest pain , no cough.  Eager to go home.  Alert and oriented  Objective: Vitals:   10/24/20  0610 10/24/20 0908 10/24/20 1300 10/24/20 1348  BP: 139/87  (!) 146/94 (!) 138/91  Pulse:   (!) 55 (!) 46  Resp:   14 16  Temp:   97.7 F (36.5 C) 98.3 F (36.8 C)  TempSrc:   Oral Oral  SpO2:  98%  98%  Weight:      Height:        Intake/Output Summary (Last 24 hours) at 10/24/2020 1415 Last data filed at 10/24/2020 0630 Gross per 24 hour  Intake 440 ml  Output 1000 ml  Net -560 ml   Filed Weights   10/22/20 2149  Weight: 79 kg    Examination:  General exam: Comfortable overall, lying in the bed HEENT:PERRL,Oral mucosa moist, Ear/Nose normal on gross exam Respiratory system: Diminished air sounds on the right side, no crackles or wheezes Cardiovascular system: S1 & S2 heard, RRR. No JVD, murmurs, rubs, gallops or clicks. No pedal edema. Gastrointestinal system: Abdomen is mildly distended, soft and nontender. No organomegaly or masses felt. Normal bowel sounds heard. Central nervous system: Alert and oriented. No focal neurological deficits. Extremities: No edema, no clubbing ,no cyanosis Skin: No rashes, lesions or ulcers,no icterus ,no pallor   Data Reviewed: I have personally reviewed following labs and imaging studies  CBC: Recent Labs  Lab 10/18/20 0332 10/22/20 2235 10/22/20 2244 10/23/20 0510  WBC 8.5  --  10.4 9.8  NEUTROABS 7.0  --  8.6* 8.4*  HGB 12.1 12.6 12.6 12.4  HCT 32.3* 37.0 34.0* 33.8*  MCV 88.3  --  88.5 89.7  PLT 152  --  216 712   Basic Metabolic Panel: Recent Labs  Lab 10/18/20 0332 10/18/20 1148 10/18/20 1604 10/22/20 2235 10/22/20 2244 10/23/20 0510 10/24/20 1033  NA 128* 129* 127* 127* 130* 127* 131*  K 3.6 3.4* 3.2* 2.8* 2.8* 3.9 4.0  CL 92* 93* 91* 88* 92* 93* 97*  CO2 30 27 25   --  26 25 27   GLUCOSE 76 84 72 77 79 70 110*  BUN 5* 6* 5* 12 14 13 15   CREATININE 0.56 0.52 0.55 0.50 0.59 0.46 0.50  CALCIUM 8.5* 8.6* 8.8*  --  9.2 8.4* 8.5*  MG 2.2  --   --   --  1.7 1.8  --   PHOS 4.0  --   --   --   --   --   --     GFR: Estimated Creatinine Clearance: 65.6 mL/min (by C-G formula based on SCr of 0.5 mg/dL). Liver Function Tests: Recent Labs  Lab 10/18/20 0332 10/22/20 2244 10/23/20 0510  AST 20 24 23   ALT 21 18 18   ALKPHOS 77 83 76  BILITOT 1.2 1.2 0.9  PROT 5.3* 6.0* 5.8*  ALBUMIN 2.9* 3.4* 3.3*   No results for input(s): LIPASE, AMYLASE in the last 168 hours. Recent Labs  Lab 10/22/20 2244 10/23/20 0510  AMMONIA 19 31   Coagulation Profile: No results for input(s): INR, PROTIME in the last 168 hours. Cardiac Enzymes: No results for input(s):  CKTOTAL, CKMB, CKMBINDEX, TROPONINI in the last 168 hours. BNP (last 3 results) No results for input(s): PROBNP in the last 8760 hours. HbA1C: No results for input(s): HGBA1C in the last 72 hours. CBG: Recent Labs  Lab 10/23/20 1412 10/23/20 1610 10/23/20 2337 10/24/20 0520 10/24/20 1301  GLUCAP 88 156* 134* 100* 125*   Lipid Profile: No results for input(s): CHOL, HDL, LDLCALC, TRIG, CHOLHDL, LDLDIRECT in the last 72 hours. Thyroid Function Tests: Recent Labs    10/22/20 2255 10/23/20 0510  TSH 1.340 1.164  FREET4 2.10*  --    Anemia Panel: No results for input(s): VITAMINB12, FOLATE, FERRITIN, TIBC, IRON, RETICCTPCT in the last 72 hours. Sepsis Labs: Recent Labs  Lab 10/22/20 2213 10/23/20 1251  PROCALCITON  --  <0.10  LATICACIDVEN 0.7  --     Recent Results (from the past 240 hour(s))  Blood Cultures (routine x 2)     Status: None (Preliminary result)   Collection Time: 10/22/20 10:13 PM   Specimen: BLOOD  Result Value Ref Range Status   Specimen Description   Final    BLOOD RIGHT ANTECUBITAL Performed at [facility] [address], [place], [place] [address]    Special Requests   Final    BOTTLES DRAWN AEROBIC AND ANAEROBIC Blood Culture adequate volume Performed at [facility] [address] [place], [place] [address]    Culture   Final    NO GROWTH 1  DAY Performed at [facility], [place] [address], [place], [place] [address]    Report Status PENDING  Incomplete  Blood Cultures (routine x 2)     Status: None (Preliminary result)   Collection Time: 10/22/20 10:18 PM   Specimen: BLOOD  Result Value Ref Range Status   Specimen Description   Final    BLOOD LEFT ANTECUBITAL Performed at [facility] [address] [place], [place] [address]    Special Requests   Final    BOTTLES DRAWN AEROBIC AND ANAEROBIC Blood Culture adequate volume Performed at [facility] [address] [place], [place] [address]    Culture   Final    NO GROWTH 1 DAY Performed at [facility], [place] [address] [place], [place] [address]    Report Status PENDING  Incomplete  Resp Panel by RT-PCR (Flu A&B, Covid) Nasopharyngeal Swab     Status: None   Collection Time: 10/23/20  1:22 AM   Specimen: Nasopharyngeal Swab; Nasopharyngeal(NP) swabs in vial transport medium  Result Value Ref Range Status   SARS Coronavirus 2 by RT PCR NEGATIVE NEGATIVE Final    Comment: (NOTE) SARS-CoV-2 target nucleic acids are NOT DETECTED.  The SARS-CoV-2 RNA is generally detectable in upper respiratory specimens during the acute phase of infection. The lowest concentration of SARS-CoV-2 viral copies this assay can detect is 138 copies/mL. A negative result does not preclude SARS-Cov-2 infection and should not be used as the sole basis for treatment or other patient management decisions. A negative result may occur with  improper specimen collection/handling, submission of specimen other than nasopharyngeal swab, presence of viral mutation(s) within the areas targeted by this assay, and inadequate number of viral copies(<138 copies/mL). A negative result must be combined with clinical observations, patient history, and epidemiological information. The expected result is Negative.  Fact Sheet for Patients:   EntrepreneurPulse.com.au  Fact Sheet for Healthcare Providers:  IncredibleEmployment.be  This test is no t yet approved or cleared by the  [place] Architectural [employer] and  has been authorized for detection and/or diagnosis of SARS-CoV-2 by FDA under an Print production planner (EUA). This EUA will remain  in effect (meaning this test can be used) for the duration of the COVID-19 declaration under Section 564(b)(1) of the Act, 21 U.S.C.section 360bbb-3(b)(1), unless the authorization is terminated  or revoked sooner.       Influenza A by PCR NEGATIVE NEGATIVE Final   Influenza B by PCR NEGATIVE NEGATIVE Final    Comment: (NOTE) The Xpert Xpress SARS-CoV-2/FLU/RSV plus assay is intended as an aid in the diagnosis of influenza from Nasopharyngeal swab specimens and should not be used as a sole basis for treatment. Nasal washings and aspirates are unacceptable for Xpert Xpress SARS-CoV-2/FLU/RSV testing.  Fact Sheet for Patients: EntrepreneurPulse.com.au  Fact Sheet for Healthcare Providers: IncredibleEmployment.be  This test is not yet approved or cleared by the [place] FDA and has been authorized for detection and/or diagnosis of SARS-CoV-2 by FDA under an Emergency Use Authorization (EUA). This EUA will remain in effect (meaning this test can be used) for the duration of the COVID-19 declaration under Section 564(b)(1) of the Act, 21 U.S.C. section 360bbb-3(b)(1), unless the authorization is terminated or revoked.  Performed at [facility], [place] [address], [place], [place] [address]          Radiology Studies: CT HEAD WO CONTRAST  Result Date: 10/22/2020 CLINICAL DATA:  Lethargic. EXAM: CT HEAD WITHOUT CONTRAST TECHNIQUE: Contiguous axial images were obtained from the base of the skull through the vertex without intravenous contrast. COMPARISON:  October 11, 2020 FINDINGS: Brain:  There is mild cerebral atrophy with widening of the extra-axial spaces and ventricular dilatation. There are areas of decreased attenuation within the white matter tracts of the supratentorial brain, consistent with microvascular disease changes. Vascular: Approximately 7.0 mm x 7.6 mm stable aneurysmal dilatation of the basilar artery is noted. Skull: Normal. Negative for fracture or focal lesion. Sinuses/Orbits: No acute finding. Other: None. IMPRESSION: 1. Generalized cerebral atrophy. 2. No acute intracranial abnormality. 3. Stable aneurysmal dilatation of the basilar artery. Electronically Signed   By: Virgina Norfolk M.D.   On: 10/22/2020 23:46   CT ABDOMEN PELVIS W CONTRAST  Result Date: 10/22/2020 CLINICAL DATA:  Lethargy. EXAM: CT ABDOMEN AND PELVIS WITH CONTRAST TECHNIQUE: Multidetector CT imaging of the abdomen and pelvis was performed using the standard protocol following bolus administration of intravenous contrast. CONTRAST:  144mL OMNIPAQUE IOHEXOL 300 MG/ML  SOLN COMPARISON:  February 07, 2021 FINDINGS: Lower chest: A large area of consolidation is seen within the right lung base. This is increased in size when compared to the prior study. A small right pleural effusion is also seen. This is also increased in size when compared to the prior exam. Hepatobiliary: The liver is cirrhotic in appearance with diffuse fatty infiltration of the liver parenchyma. No focal liver abnormality is seen. Status post cholecystectomy. No biliary dilatation. Pancreas: Unremarkable. No pancreatic ductal dilatation or surrounding inflammatory changes. Spleen: No splenic injury or perisplenic hematoma. Adrenals/Urinary Tract: Adrenal glands are unremarkable. Kidneys are normal in size, without obstructing renal calculi or hydronephrosis. Stable simple cysts are seen within the left kidney. A 4 mm nonobstructing renal stone is seen within the lower pole of the right kidney. Bladder is unremarkable. Stomach/Bowel:  Stomach is within normal limits. Appendix appears normal. No evidence of bowel wall thickening, distention, or inflammatory changes. Vascular/Lymphatic: Aortic atherosclerosis. Recanalization of the paraumbilical vein is noted with multiple venous collaterals  seen. No enlarged abdominal or pelvic lymph nodes. Reproductive: Uterus and bilateral adnexa are unremarkable. Other: No abdominal wall hernia or abnormality. No abdominopelvic ascites. Musculoskeletal: A chronic anterolateral sixth left rib deformity is seen. Additional chronic fracture deformity is seen involving the right inferior pubic ramus. Compression fracture deformities are seen at the levels of T9, T10, T12 and L1, with prior vertebroplasty at the levels of T9 and T10. IMPRESSION: 1. Marked severity right lower lobe consolidation, increased in severity when compared to the prior study. 2. Small right pleural effusion. 3. Cirrhosis and portal hypertension. 4. Prior cholecystectomy. 5. Multiple compression fractures within the thoracic and vertebral spine, as described above. Electronically Signed   By: Virgina Norfolk M.D.   On: 10/22/2020 23:56   DG Chest Port 1 View  Result Date: 10/22/2020 CLINICAL DATA:  Lethargic. EXAM: PORTABLE CHEST 1 VIEW COMPARISON:  October 12, 2020 FINDINGS: Chronic appearing increased lung markings are seen without evidence of acute infiltrate, pleural effusion or pneumothorax. The cardiac silhouette is mildly enlarged. Multiple chronic right-sided rib fractures are seen. A radiopaque right shoulder replacement is noted. There is evidence of prior vertebroplasty within the lower thoracic spine. IMPRESSION: Stable cardiomegaly and chronic changes without acute cardiopulmonary disease. Electronically Signed   By: Virgina Norfolk M.D.   On: 10/22/2020 23:11   DG Swallowing Func-Speech Pathology  Result Date: 10/24/2020 Objective Swallowing Evaluation: Type of Study: MBS-Modified Barium Swallow Study  Patient Details  Name: [name] MRN: [id number] Date of Birth: Nov 09, 1950 Today's Date: 10/24/2020 Time: SLP Start Time (ACUTE ONLY): 86 -SLP Stop Time (ACUTE ONLY): 1230 SLP Time Calculation (min) (ACUTE ONLY): 10 min Past Medical History: Past Medical History: Diagnosis Date . ACROCHORDON 03/16/2010 . GI bleeding 06/2018 . Heart murmur  . Hypertension  . Hypothyroidism  . Liver cirrhosis ([place])  . Multiple duodenal ulcers 07/16/2018  associated with melena, anemia, gastritis. due to NSAID.  hx gastric ulcers and bleeding 2004, 2007 . Thyroid disease   S/p Radiation Past Surgical History: Past Surgical History: Procedure Laterality Date . BIOPSY  07/16/2018  Procedure: BIOPSY;  Surgeon: Mauri Pole, MD;  Location: [facility];  Service: Endoscopy;; . BREAST EXCISIONAL BIOPSY Left   benign . CHOLECYSTECTOMY N/A 12/07/2015  Procedure: LAPAROSCOPIC CHOLECYSTECTOMY WITH INTRAOPERATIVE CHOLANGIOGRAM;  Surgeon: Alphonsa Overall, MD;  Location: [facility];  Service: General;  Laterality: N/A; . ERCP N/A 12/06/2015  Procedure: ENDOSCOPIC RETROGRADE CHOLANGIOPANCREATOGRAPHY (ERCP);  Surgeon: Milus Banister, MD;  Location: [facility];  Service: Endoscopy;  Laterality: N/A; . ESOPHAGOGASTRODUODENOSCOPY (EGD) WITH PROPOFOL N/A 07/16/2018  Procedure: ESOPHAGOGASTRODUODENOSCOPY (EGD) WITH PROPOFOL;  Surgeon: Mauri Pole, MD;  Location: [facility];  Service: Endoscopy;  Laterality: N/A; . IM NAILING TIBIA Right 07/19/2012  DR Maureen Ralphs . IR VERTEBROPLASTY CERV/THOR BX INC UNI/BIL INC/INJECT/IMAGING  10/17/2020 . IR VERTEBROPLASTY EA ADDL (T&LS) BX INC UNI/BIL INC INJECT/IMAGING  10/17/2020 . SHOULDER HEMI-ARTHROPLASTY Right 07/22/2013  Procedure: RIGHT SHOULDER HEMI-ARTHROPLASTY;  Surgeon: Augustin Schooling, MD;  Location: [facility];  Service: Orthopedics;  Laterality: Right; . TIBIA IM NAIL INSERTION Right 07/19/2013  Procedure: INTRAMEDULLARY (IM) NAIL TIBIAL;  Surgeon: Gearlean Alf, MD;  Location: [facility];  Service: Orthopedics;   Laterality: Right; . TUBAL LIGATION   HPI: Patient is a 70 y.o. female with PMH: liver cirrhosis, recently diagnosed with afib and compression fracture of thoracic spince during which patient underwent kyphoplasty and has been on narcotic pain relief medication, EGD in 2020 found multiple partially obstructing duodenal ulcers with adherent clot; suspicious  for NSAID induced etiology. She was brought to ER secondary to family observing increasing confusion. In ER, patient was c/o abdominal discomfort and found to be lethargic when MD was examining her. CT head was unremarkable but CT abdomen, pelvis showed worsening consolidation  in right lower lung field concerning for PNA. COVID test negative.  Subjective: alert, cooperative Assessment / Plan / Recommendation CHL IP CLINICAL IMPRESSIONS 10/24/2020 Clinical Impression Patient presents with a mild oropharyngeal dysphagia but without aspiration. During oral phase of swallow, patient exhibited mild mastication and transit of bolus delays with regular solids as well as difficulty keeping bolus of thin liquid barium and barium tablet together. When give puree solids (applesauce),barium tablet then transited orally and pharyngeally without difficulty. Swallow initiation delay at level of vallecular sinus was observed with cup sips thin liquids and to vallecular heading into pyriform sinus delay with straw sips thin liquids. In addition, patient exhibited trace flash penetration of thin liquids during straw sips but which cleared fully during swallow. Patient did have some periodic throat clearing which may have been due to the trace vallecular residuals post initial swallows but likely related to h/o GERD and esophageal dysfunction. Cervical esophageal phase of swallow revealed prominent cricopharyngeal bar, however this did not appear to impact patient's swallow function significantly. SLP recommending to continue with Regular solids, thin liquids diet. SLP Visit  Diagnosis Dysphagia, oropharyngeal phase (R13.12) Attention and concentration deficit following -- Frontal lobe and executive function deficit following -- Impact on safety and function No limitations;Mild aspiration risk   CHL IP TREATMENT RECOMMENDATION 10/24/2020 Treatment Recommendations Therapy as outlined in treatment plan below   Prognosis 10/24/2020 Prognosis for Safe Diet Advancement Good Barriers to Reach Goals -- Barriers/Prognosis Comment -- CHL IP DIET RECOMMENDATION 10/24/2020 SLP Diet Recommendations Regular solids;Thin liquid Liquid Administration via Cup;Straw Medication Administration Whole meds with puree Compensations Slow rate;Small sips/bites Postural Changes --   CHL IP OTHER RECOMMENDATIONS 10/24/2020 Recommended Consults -- Oral Care Recommendations Oral care BID Other Recommendations --   CHL IP FOLLOW UP RECOMMENDATIONS 10/24/2020 Follow up Recommendations None   CHL IP FREQUENCY AND DURATION 10/24/2020 Speech Therapy Frequency (ACUTE ONLY) min 1 x/week Treatment Duration 1 week      CHL IP ORAL PHASE 10/24/2020 Oral Phase Impaired Oral - Pudding Teaspoon -- Oral - Pudding Cup -- Oral - Honey Teaspoon -- Oral - Honey Cup -- Oral - Nectar Teaspoon -- Oral - Nectar Cup -- Oral - Nectar Straw -- Oral - Thin Teaspoon -- Oral - Thin Cup WFL Oral - Thin Straw WFL Oral - Puree -- Oral - Mech Soft -- Oral - Regular Impaired mastication Oral - Multi-Consistency -- Oral - Pill Reduced posterior propulsion;Decreased bolus cohesion Oral Phase - Comment --  CHL IP PHARYNGEAL PHASE 10/24/2020 Pharyngeal Phase Impaired Pharyngeal- Pudding Teaspoon -- Pharyngeal -- Pharyngeal- Pudding Cup -- Pharyngeal -- Pharyngeal- Honey Teaspoon -- Pharyngeal -- Pharyngeal- Honey Cup -- Pharyngeal -- Pharyngeal- Nectar Teaspoon -- Pharyngeal -- Pharyngeal- Nectar Cup -- Pharyngeal -- Pharyngeal- Nectar Straw -- Pharyngeal -- Pharyngeal- Thin Teaspoon -- Pharyngeal -- Pharyngeal- Thin Cup Delayed swallow initiation-vallecula;Pharyngeal  residue - valleculae Pharyngeal -- Pharyngeal- Thin Straw Delayed swallow initiation-vallecula;Delayed swallow initiation-pyriform sinuses;Penetration/Aspiration during swallow;Pharyngeal residue - valleculae Pharyngeal Material enters airway, remains ABOVE vocal cords then ejected out Pharyngeal- Puree WFL Pharyngeal -- Pharyngeal- Mechanical Soft -- Pharyngeal -- Pharyngeal- Regular WFL Pharyngeal -- Pharyngeal- Multi-consistency -- Pharyngeal -- Pharyngeal- Pill WFL Pharyngeal -- Pharyngeal Comment --  CHL IP CERVICAL ESOPHAGEAL PHASE 10/24/2020 Cervical  Esophageal Phase Impaired Pudding Teaspoon -- Pudding Cup -- Honey Teaspoon -- Honey Cup -- Nectar Teaspoon -- Nectar Cup -- Nectar Straw -- Thin Teaspoon -- Thin Cup -- Thin Straw -- Puree -- Mechanical Soft -- Regular -- Multi-consistency -- Pill -- Cervical Esophageal Comment Patient with prominent cricopharyngeal bar however it did not appear to be significantly impacting patient's swallow function Sonia Baller, MA, CCC-SLP Speech Therapy                  Scheduled Meds: . budesonide (PULMICORT) nebulizer solution  0.25 mg Nebulization BID  . enoxaparin (LOVENOX) injection  40 mg Subcutaneous Q24H  . ipratropium-albuterol  3 mL Nebulization TID  . levothyroxine  50 mcg Intravenous Daily  . metoprolol tartrate  5 mg Intravenous Q6H  . pantoprazole (PROTONIX) IV  40 mg Intravenous Q24H   Continuous Infusions: . azithromycin 500 mg (10/24/20 0938)  . cefTRIAXone (ROCEPHIN)  IV 2 g (10/24/20 0827)     LOS: 1 day    Time spent: More than 50% of that time was spent in counseling and/or coordination of care.      Shelly Coss, MD [facility] P5/07/2020, 2:15 PM

## 2020-10-25 ENCOUNTER — Inpatient Hospital Stay (HOSPITAL_COMMUNITY): Payer: Medicare Other

## 2020-10-25 LAB — BASIC METABOLIC PANEL
Anion gap: 9 (ref 5–15)
BUN: 13 mg/dL (ref 8–23)
CO2: 27 mmol/L (ref 22–32)
Calcium: 8.7 mg/dL — ABNORMAL LOW (ref 8.9–10.3)
Chloride: 97 mmol/L — ABNORMAL LOW (ref 98–111)
Creatinine, Ser: 0.48 mg/dL (ref 0.44–1.00)
GFR, Estimated: >60 mL/min (ref >60)
Glucose, Bld: 89 mg/dL (ref 70–99)
Potassium: 3.5 mmol/L (ref 3.5–5.1)
Sodium: 133 mmol/L — ABNORMAL LOW (ref 135–145)

## 2020-10-25 IMAGING — DX DG CHEST 1V PORT
1 series · 1 of 1 positions shown · non-contrast
Comparison: Chest x-ray [DATE].  CT [DATE].

CLINICAL DATA: Shortness of breath.

EXAM:
PORTABLE CHEST 1 VIEW

[chest ap]
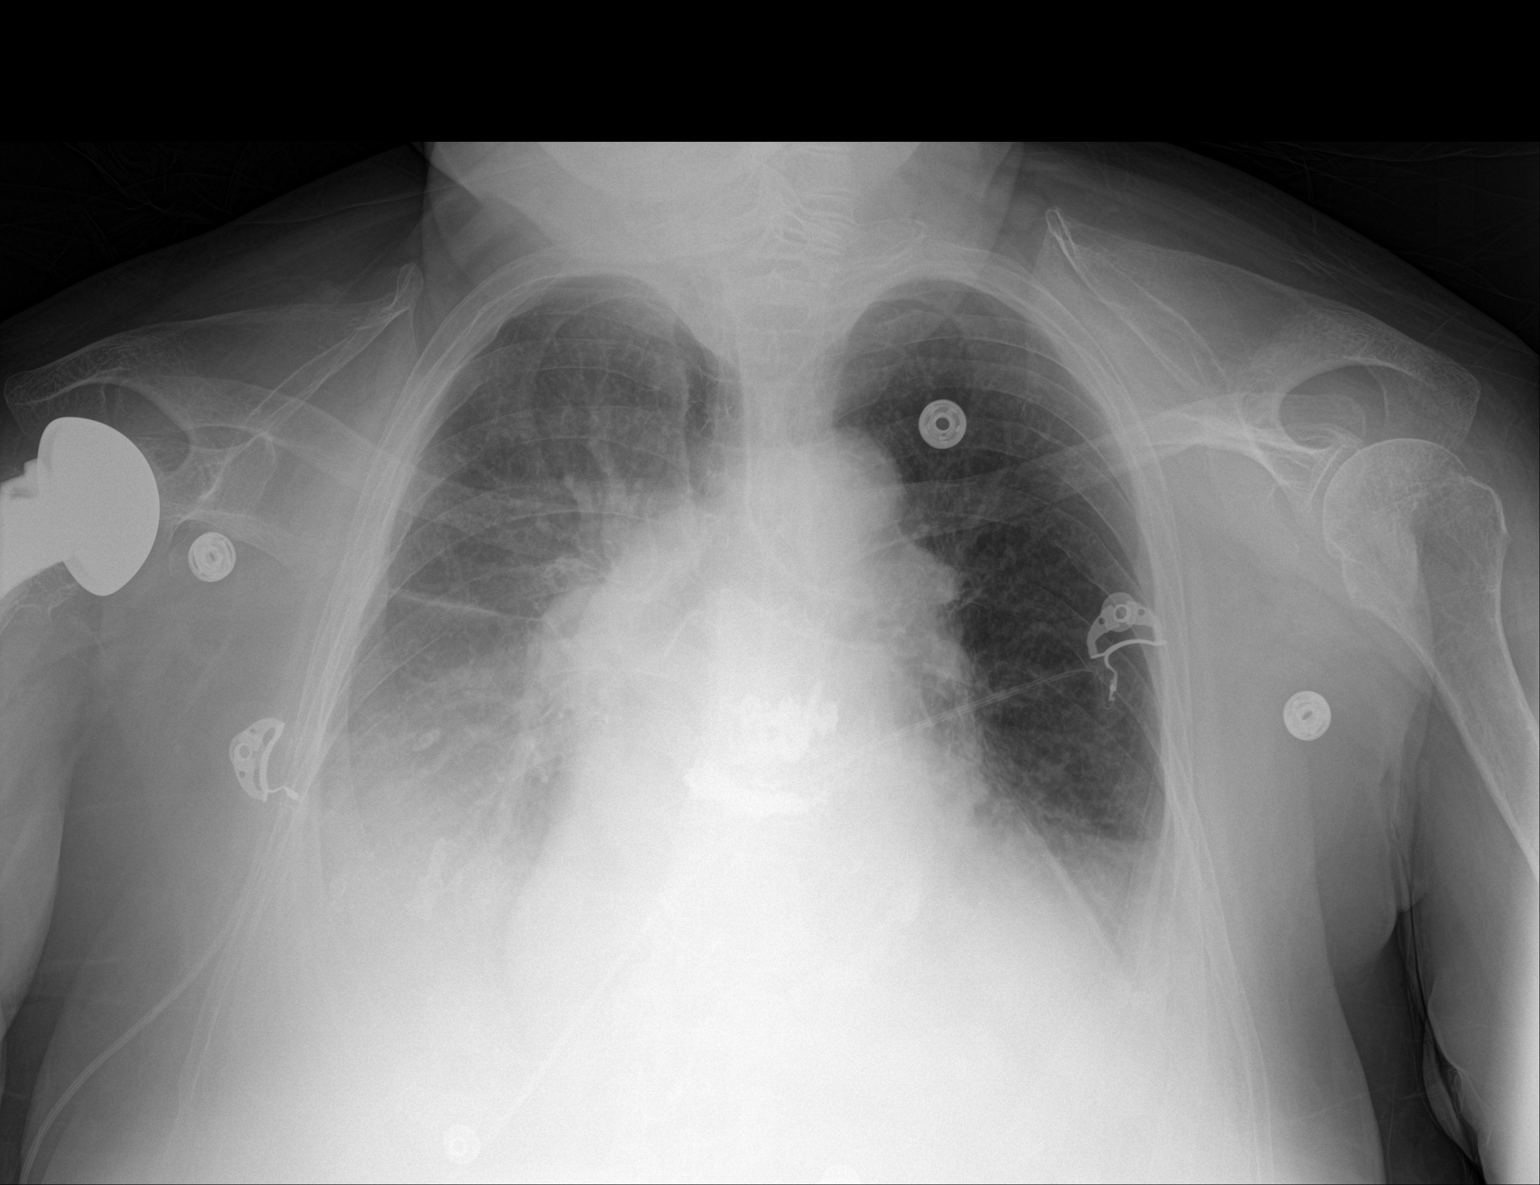

[1 of 1 positions shown; findings below may reference images not displayed]

FINDINGS: Cardiomegaly. Low lung volumes with bibasilar infiltrates/edema and
bilateral pleural effusions. No pneumothorax. Postsurgical changes
right shoulder and thoracic spine.
IMPRESSION: Cardiomegaly. Low lung volumes with bibasilar infiltrates/edema and
bilateral pleural effusions. Findings progressed from prior exam.

## 2020-10-25 MED ORDER — SODIUM CHLORIDE 0.9 % IV SOLN: INTRAVENOUS | Status: AC

## 2020-10-25 MED ORDER — LEVOTHYROXINE SODIUM 100 MCG PO TABS
100.0000 ug | ORAL_TABLET | Freq: Every day | ORAL | Status: DC
  Administered 2020-10-26 – 2020-10-27 (×2): 100 ug via ORAL
  Filled 2020-10-25 (×2): qty 1

## 2020-10-25 MED ORDER — AZITHROMYCIN 250 MG PO TABS
500.0000 mg | ORAL_TABLET | Freq: Every day | ORAL | Status: AC
  Administered 2020-10-26 – 2020-10-27 (×2): 500 mg via ORAL
  Filled 2020-10-25 (×2): qty 2

## 2020-10-25 MED ORDER — HYDRALAZINE HCL 50 MG PO TABS
75.0000 mg | ORAL_TABLET | Freq: Three times a day (TID) | ORAL | Status: DC
  Administered 2020-10-25 – 2020-10-27 (×8): 75 mg via ORAL
  Filled 2020-10-25 (×10): qty 1

## 2020-10-25 MED ORDER — SODIUM CHLORIDE 0.9 % IV SOLN: INTRAVENOUS | Status: DC

## 2020-10-25 MED ORDER — PANTOPRAZOLE SODIUM 40 MG PO TBEC
40.0000 mg | DELAYED_RELEASE_TABLET | Freq: Every day | ORAL | Status: DC
  Administered 2020-10-25 – 2020-10-27 (×3): 40 mg via ORAL
  Filled 2020-10-25 (×3): qty 1

## 2020-10-25 MED ORDER — DILTIAZEM HCL ER COATED BEADS 120 MG PO CP24
120.0000 mg | ORAL_CAPSULE | Freq: Every day | ORAL | Status: DC
  Administered 2020-10-25 – 2020-10-27 (×3): 120 mg via ORAL
  Filled 2020-10-25 (×4): qty 1

## 2020-10-25 MED ORDER — DICLOFENAC SODIUM 1 % EX GEL
4.0000 g | Freq: Four times a day (QID) | CUTANEOUS | Status: DC
  Administered 2020-10-25 – 2020-10-27 (×9): 4 g via TOPICAL
  Filled 2020-10-25 (×2): qty 100

## 2020-10-25 NOTE — Progress Notes (Signed)
PHARMACIST - PHYSICIAN COMMUNICATION   CONCERNING: Antibiotic IV to Oral Route Change Policy   RECOMMENDATION: This patient is receiving azithromycin by the intravenous route.  Based on criteria approved by the Pharmacy and Therapeutics Committee, the antibiotic(s) is/are being converted to the equivalent oral dose form(s).     DESCRIPTION:  These criteria include:  Patient being treated for a respiratory tract infection, urinary tract infection, cellulitis or clostridium difficile associated diarrhea if on metronidazole  The patient is not neutropenic and does not exhibit a GI malabsorption state  The patient is eating (either orally or via tube) and/or has been taking other orally administered medications for a least 24 hours  The patient is improving clinically and has a Tmax < 100.5   If you have questions about this conversion, please contact the Pharmacy Department  []   [phone] )  [facility] []   [phone] )  [facility] []   [phone] )  [facility] []   [phone] )  [facility] [x]   [phone] )  [place], PharmD, [place], [place] Infectious Diseases Clinical Pharmacist Phone: [phone] 10/25/2020 9:58 AM

## 2020-10-25 NOTE — Progress Notes (Signed)
Pt is awake and alert, watching tv, no increased wob/respiratory distress noted.  HR85, rr16, spo2 98% on room air.  Bipap in room on standby, but not indicated at this time.

## 2020-10-25 NOTE — Plan of Care (Signed)
  Problem: Clinical Measurements: Goal: Diagnostic test results will improve Outcome: Progressing   Problem: Activity: Goal: Risk for activity intolerance will decrease Outcome: Progressing   Problem: Health Behavior/Discharge Planning: Goal: Ability to manage health-related needs will improve Outcome: Progressing   Problem: Clinical Measurements: Goal: Diagnostic test results will improve Outcome: Progressing Goal: Respiratory complications will improve Outcome: Progressing

## 2020-10-25 NOTE — Progress Notes (Signed)
Talked to pt's daughters [name] [phone]) on phone at 2300 on 10/24/20 and Amanda at 0600.  Daughters would like to speak with physician about several concerns they have.  This nurse encouraged daughters to be at bedside this morning for physician rounding and ask questions in person.  If they are unable to find transportation, they would appreciate a physician follow up call.   I will pass this message along to the AM nurse.

## 2020-10-25 NOTE — Plan of Care (Signed)
  Problem: Health Behavior/Discharge Planning: Goal: Ability to manage health-related needs will improve Outcome: Progressing   Problem: Clinical Measurements: Goal: Diagnostic test results will improve Outcome: Progressing   Problem: Activity: Goal: Risk for activity intolerance will decrease Outcome: Progressing   Problem: Health Behavior/Discharge Planning: Goal: Ability to manage health-related needs will improve Outcome: Progressing   Problem: Clinical Measurements: Goal: Will remain free from infection Outcome: Progressing Goal: Diagnostic test results will improve Outcome: Progressing

## 2020-10-25 NOTE — Progress Notes (Signed)
Chaplain responded to a request from the nurse via the patient a request for prayer and support.  Patient welcomed the visit.  Patient shared some about her diagnosis and plan to go to rehab center to get stronger and get back home, which is her goal.  Patient feels like her family is falling apart and she wants to get home and bring her family back together to make them strong again.  Patient is not affiliated with a  church community, but has a strong faith and belief that God can giver her the strength needed to reach her goals.  Chaplain provided empathetic/reflective listening and words of encouragement, Psalm 131 and prayer.  Patient asked for day Chaplain to visit tomorrow to offer more prayer, will pass along. [place], [place].    10/25/20 2138  Clinical Encounter Type  Visited With Patient  Visit Type Spiritual support  Referral From Patient;Nurse  Consult/Referral To Chaplain  Spiritual Encounters  Spiritual Needs Prayer  Stress Factors  Patient Stress Factors Family relationships

## 2020-10-25 NOTE — Progress Notes (Signed)
PROGRESS NOTE    [name]  SHF:[id number] DOB: 1950/06/26 DOA: 10/22/2020 PCP: [name], MD   Chief Complain: Confusion  Brief Narrative: Patient is a 70 female with history of liver cirrhosis, recently diagnosed permanent A. fib, compression fracture of the thoracic spine status post kyphoplasty who was brought to the emergency department with complaints of confusion.  She was lethargic on presentation.  Her lab work showed severe hypokalemia.  CT head was unremarkable.  CT abdomen/pelvis showed consolidation in the right lower lung field concerning for pneumonia.  She was started on IV antibiotics for possible community-acquired pneumonia.  Currently mental status has improved.  PT/OT  Consulted and recommended SNF on DC.  Patient is medically stable for discharge to skilled facility as soon as bed is available.  Assessment & Plan:   Principal Problem:   Acute encephalopathy Active Problems:   HYPERTENSION, BENIGN ESSENTIAL   CIRRHOSIS, ALCOHOLIC, LIVER   Essential hypertension   Pneumonia   Acute encephalopathy: Currently alert and oriented but appears little foggy.  Encephalopathy could be multifactorial.  Most likely associated with narcotic use that she was taking for recent kyphoplasty.  NH3 levels were normal.  ABG did not show any hypercarbia. Suspicious for dementia.  As per the daughter, she is forgetful/confused at times, and this has been an ongoing problem.  Right lower lobe consolidation/suspected pneumonia: Currently respiratory status is stable.  Started on ceftriaxone azithromycin.  CT abdomen pelvis showed some consolidation of the right lower lung field, chest x-ray done did not show pneumonia.  Continue current antibiotics for now.  Aspiration was suspected, speech therapy consulted, underwent MBS without finding of any aspiration.  Procalcitonin reassuring.  Currently she is on room air. Follow-up chest x-ray on 5/22 showed bibasilar  atelectasis/infiltrate/pleural effusion.  Permanent A. fib: Continue Cardizem.  Not on anticoagulation because she was thought to be not a good candidate for that.  We recommend to follow-up with cardiology as an outpatient.  History of diastolic congestive heart failure: Currently euvolemic  Cirrhosis: Has history of liver cirrhosis with portal hypertension.  No finding of ascites as per CT abdomen/pelvis.  Ammonia level in the range of 30.  Needs to follow-up with GI as an outpatient.  Recent kyphoplasty for thoracic spine fracture: Taking opioids at home for pain relief.  PT/OT recommended SNF visit on discharge.Will recommend to minimize narcotics ,she still complains of back pain.  Continue Voltaren gel, lidocaine patch  Hypertension: Currently blood pressure stable.  Continue to monitor  Hypomagnesemia: Supplemented and corrected  Chronic hyponatremia: Stable/improved.  Urine sodium found to be less than 10.  Normal urine /plasma osmolality.  She did not have great urine output last night.  Will start gentle IV fluids for few hours.         DVT prophylaxis:Lovenox Code Status:  Family Communication: Called and discussed with daughter on phone on 10/25/20 Status is: Inpatient  Remains inpatient appropriate because:Inpatient level of care appropriate due to severity of illness   Dispo: The patient is from: Home              Anticipated d/c is to: Home              Patient currently is not medically stable to d/c.   Difficult to place patient No    Consultants: None  Procedures:None  Antimicrobials:  Anti-infectives (From admission, onward)   Start     Dose/Rate Route Frequency Ordered Stop   10/23/20 0800  cefTRIAXone (ROCEPHIN) 2  g in sodium chloride 0.9 % 100 mL IVPB        2 g 200 mL/hr over 30 Minutes Intravenous Every 24 hours 10/23/20 0443 10/28/20 0759   10/23/20 0800  azithromycin (ZITHROMAX) 500 mg in sodium chloride 0.9 % 250 mL IVPB        500 mg 250 mL/hr  over 60 Minutes Intravenous Every 24 hours 10/23/20 0443 10/28/20 0759   10/23/20 0130  piperacillin-tazobactam (ZOSYN) IVPB 3.375 g        3.375 g 100 mL/hr over 30 Minutes Intravenous  Once 10/23/20 0129 10/23/20 0237      Subjective:  Patient seen and examined the bedside this morning.  Hemodynamically stable.  Overall looks comfortable.  Complains of back pain.  She is saturating fine on room air.  Objective: Vitals:   10/24/20 2046 10/24/20 2100 10/25/20 0453 10/25/20 0714  BP:  (!) 147/92 (!) 151/103   Pulse:  99 88   Resp:  19 18   Temp:  98 F (36.7 C) 98.4 F (36.9 C)   TempSrc:  Oral Oral   SpO2: 93% 95% 99% 97%  Weight:      Height:        Intake/Output Summary (Last 24 hours) at 10/25/2020 0810 Last data filed at 10/25/2020 0600 Gross per 24 hour  Intake 390 ml  Output 200 ml  Net 190 ml   Filed Weights   10/22/20 2149  Weight: 79 kg    Examination:  General exam: Overall comfortable, not in distress, very deconditioned, debilitated HEENT: PERRL Respiratory system:  Faint crackles on the bases,no wheezes or crackles  Cardiovascular system: S1 & S2 heard, RRR.  Gastrointestinal system: Abdomen is nondistended, soft and nontender. Central nervous system: Alert and oriented.  Told month correctly. Extremities: No edema, no clubbing ,no cyanosis Skin: No rashes, no ulcers,no icterus   Data Reviewed: I have personally reviewed following labs and imaging studies  CBC: Recent Labs  Lab 10/22/20 2235 10/22/20 2244 10/23/20 0510  WBC  --  10.4 9.8  NEUTROABS  --  8.6* 8.4*  HGB 12.6 12.6 12.4  HCT 37.0 34.0* 33.8*  MCV  --  88.5 89.7  PLT  --  216 [id number]   Basic Metabolic Panel: Recent Labs  Lab 10/18/20 1148 10/18/20 1604 10/22/20 2235 10/22/20 2244 10/23/20 0510 10/24/20 1033  NA 129* 127* 127* 130* 127* 131*  K 3.4* 3.2* 2.8* 2.8* 3.9 4.0  CL 93* 91* 88* 92* 93* 97*  CO2 27 25  --  26 25 27   GLUCOSE 84 72 77 79 70 110*  BUN 6* 5* 12 14 13  15   CREATININE 0.52 0.55 0.50 0.59 0.46 0.50  CALCIUM 8.6* 8.8*  --  9.2 8.4* 8.5*  MG  --   --   --  1.7 1.8  --    GFR: Estimated Creatinine Clearance: 65.6 mL/min (by C-G formula based on SCr of 0.5 mg/dL). Liver Function Tests: Recent Labs  Lab 10/22/20 2244 10/23/20 0510  AST 24 23  ALT 18 18  ALKPHOS 83 76  BILITOT 1.2 0.9  PROT 6.0* 5.8*  ALBUMIN 3.4* 3.3*   No results for input(s): LIPASE, AMYLASE in the last 168 hours. Recent Labs  Lab 10/22/20 2244 10/23/20 0510  AMMONIA 19 31   Coagulation Profile: No results for input(s): INR, PROTIME in the last 168 hours. Cardiac Enzymes: No results for input(s): CKTOTAL, CKMB, CKMBINDEX, TROPONINI in the last 168 hours. BNP (last 3 results) No  results for input(s): PROBNP in the last 8760 hours. HbA1C: No results for input(s): HGBA1C in the last 72 hours. CBG: Recent Labs  Lab 10/23/20 1412 10/23/20 1610 10/23/20 2337 10/24/20 0520 10/24/20 1301  GLUCAP 88 156* 134* 100* 125*   Lipid Profile: No results for input(s): CHOL, HDL, LDLCALC, TRIG, CHOLHDL, LDLDIRECT in the last 72 hours. Thyroid Function Tests: Recent Labs    10/22/20 2255 10/23/20 0510  TSH 1.340 1.164  FREET4 2.10*  --    Anemia Panel: No results for input(s): VITAMINB12, FOLATE, FERRITIN, TIBC, IRON, RETICCTPCT in the last 72 hours. Sepsis Labs: Recent Labs  Lab 10/22/20 2213 10/23/20 1251  PROCALCITON  --  <0.10  LATICACIDVEN 0.7  --     Recent Results (from the past 240 hour(s))  Blood Cultures (routine x 2)     Status: None (Preliminary result)   Collection Time: 10/22/20 10:13 PM   Specimen: BLOOD  Result Value Ref Range Status   Specimen Description   Final    BLOOD RIGHT ANTECUBITAL Performed at [facility] [address], [place], [place] [address]    Special Requests   Final    BOTTLES DRAWN AEROBIC AND ANAEROBIC Blood Culture adequate volume Performed at [facility]  [address] [place], [place] [address]    Culture   Final    NO GROWTH 1 DAY Performed at [facility], [place] [address] [place], [place] [address]    Report Status PENDING  Incomplete  Blood Cultures (routine x 2)     Status: None (Preliminary result)   Collection Time: 10/22/20 10:18 PM   Specimen: BLOOD  Result Value Ref Range Status   Specimen Description   Final    BLOOD LEFT ANTECUBITAL Performed at [facility] [address] [place], [place] [address]    Special Requests   Final    BOTTLES DRAWN AEROBIC AND ANAEROBIC Blood Culture adequate volume Performed at [facility] [address] [place], [place] [address]    Culture   Final    NO GROWTH 1 DAY Performed at [facility], [place] [address] [place], [place] [address]    Report Status PENDING  Incomplete  Resp Panel by RT-PCR (Flu A&B, Covid) Nasopharyngeal Swab     Status: None   Collection Time: 10/23/20  1:22 AM   Specimen: Nasopharyngeal Swab; Nasopharyngeal(NP) swabs in vial transport medium  Result Value Ref Range Status   SARS Coronavirus 2 by RT PCR NEGATIVE NEGATIVE Final    Comment: (NOTE) SARS-CoV-2 target nucleic acids are NOT DETECTED.  The SARS-CoV-2 RNA is generally detectable in upper respiratory specimens during the acute phase of infection. The lowest concentration of SARS-CoV-2 viral copies this assay can detect is 138 copies/mL. A negative result does not preclude SARS-Cov-2 infection and should not be used as the sole basis for treatment or other patient management decisions. A negative result may occur with  improper specimen collection/handling, submission of specimen other than nasopharyngeal swab, presence of viral mutation(s) within the areas targeted by this assay, and inadequate number of viral copies(<138 copies/mL). A negative result must be combined with clinical observations, patient history, and epidemiological information. The  expected result is Negative.  Fact Sheet for Patients:  EntrepreneurPulse.com.au  Fact Sheet for Healthcare Providers:  IncredibleEmployment.be  This test is no t yet approved or cleared by the [place] FDA and  has been authorized for detection and/or diagnosis of SARS-CoV-2  by FDA under an Emergency Use Authorization (EUA). This EUA will remain  in effect (meaning this test can be used) for the duration of the COVID-19 declaration under Section 564(b)(1) of the Act, 21 U.S.C.section 360bbb-3(b)(1), unless the authorization is terminated  or revoked sooner.       Influenza A by PCR NEGATIVE NEGATIVE Final   Influenza B by PCR NEGATIVE NEGATIVE Final    Comment: (NOTE) The Xpert Xpress SARS-CoV-2/FLU/RSV plus assay is intended as an aid in the diagnosis of influenza from Nasopharyngeal swab specimens and should not be used as a sole basis for treatment. Nasal washings and aspirates are unacceptable for Xpert Xpress SARS-CoV-2/FLU/RSV testing.  Fact Sheet for Patients: EntrepreneurPulse.com.au  Fact Sheet for Healthcare Providers: IncredibleEmployment.be  This test is not yet approved or cleared by the [place] FDA and has been authorized for detection and/or diagnosis of SARS-CoV-2 by FDA under an Emergency Use Authorization (EUA). This EUA will remain in effect (meaning this test can be used) for the duration of the COVID-19 declaration under Section 564(b)(1) of the Act, 21 U.S.C. section 360bbb-3(b)(1), unless the authorization is terminated or revoked.  Performed at [facility], [place] [address] [place], [place] [address]          Radiology Studies: DG CHEST PORT 1 VIEW  Result Date: 10/25/2020 CLINICAL DATA:  Shortness of breath. EXAM: PORTABLE CHEST 1 VIEW COMPARISON:  Chest x-ray 10/22/2020.  CT 10/13/2020. FINDINGS: Cardiomegaly. Low lung volumes with  bibasilar infiltrates/edema and bilateral pleural effusions. No pneumothorax. Postsurgical changes right shoulder and thoracic spine. IMPRESSION: Cardiomegaly. Low lung volumes with bibasilar infiltrates/edema and bilateral pleural effusions. Findings progressed from prior exam. Electronically Signed   By: Marcello Moores  Register   On: 10/25/2020 05:39   DG Swallowing Func-Speech Pathology  Result Date: 10/24/2020 Objective Swallowing Evaluation: Type of Study: MBS-Modified Barium Swallow Study  Patient Details Name: [name] MRN: [id number] Date of Birth: 1950/11/12 Today's Date: 10/24/2020 Time: SLP Start Time (ACUTE ONLY): 68 -SLP Stop Time (ACUTE ONLY): 1230 SLP Time Calculation (min) (ACUTE ONLY): 10 min Past Medical History: Past Medical History: Diagnosis Date . ACROCHORDON 03/16/2010 . GI bleeding 06/2018 . Heart murmur  . Hypertension  . Hypothyroidism  . Liver cirrhosis ([place])  . Multiple duodenal ulcers 07/16/2018  associated with melena, anemia, gastritis. due to NSAID.  hx gastric ulcers and bleeding 2004, 2007 . Thyroid disease   S/p Radiation Past Surgical History: Past Surgical History: Procedure Laterality Date . BIOPSY  07/16/2018  Procedure: BIOPSY;  Surgeon: Mauri Pole, MD;  Location: [facility];  Service: Endoscopy;; . BREAST EXCISIONAL BIOPSY Left   benign . CHOLECYSTECTOMY N/A 12/07/2015  Procedure: LAPAROSCOPIC CHOLECYSTECTOMY WITH INTRAOPERATIVE CHOLANGIOGRAM;  Surgeon: Alphonsa Overall, MD;  Location: [facility];  Service: General;  Laterality: N/A; . ERCP N/A 12/06/2015  Procedure: ENDOSCOPIC RETROGRADE CHOLANGIOPANCREATOGRAPHY (ERCP);  Surgeon: Milus Banister, MD;  Location: [facility];  Service: Endoscopy;  Laterality: N/A; . ESOPHAGOGASTRODUODENOSCOPY (EGD) WITH PROPOFOL N/A 07/16/2018  Procedure: ESOPHAGOGASTRODUODENOSCOPY (EGD) WITH PROPOFOL;  Surgeon: Mauri Pole, MD;  Location: [facility];  Service: Endoscopy;  Laterality: N/A; . IM NAILING TIBIA Right 07/19/2012  DR  Maureen Ralphs . IR VERTEBROPLASTY CERV/THOR BX INC UNI/BIL INC/INJECT/IMAGING  10/17/2020 . IR VERTEBROPLASTY EA ADDL (T&LS) BX INC UNI/BIL INC INJECT/IMAGING  10/17/2020 . SHOULDER HEMI-ARTHROPLASTY Right 07/22/2013  Procedure: RIGHT SHOULDER HEMI-ARTHROPLASTY;  Surgeon: Augustin Schooling, MD;  Location: [facility];  Service: Orthopedics;  Laterality: Right; . TIBIA IM NAIL INSERTION Right  07/19/2013  Procedure: INTRAMEDULLARY (IM) NAIL TIBIAL;  Surgeon: Gearlean Alf, MD;  Location: [facility];  Service: Orthopedics;  Laterality: Right; . TUBAL LIGATION   HPI: Patient is a 70 y.o. female with PMH: liver cirrhosis, recently diagnosed with afib and compression fracture of thoracic spince during which patient underwent kyphoplasty and has been on narcotic pain relief medication, EGD in 2020 found multiple partially obstructing duodenal ulcers with adherent clot; suspicious for NSAID induced etiology. She was brought to ER secondary to family observing increasing confusion. In ER, patient was c/o abdominal discomfort and found to be lethargic when MD was examining her. CT head was unremarkable but CT abdomen, pelvis showed worsening consolidation  in right lower lung field concerning for PNA. COVID test negative.  Subjective: alert, cooperative Assessment / Plan / Recommendation CHL IP CLINICAL IMPRESSIONS 10/24/2020 Clinical Impression Patient presents with a mild oropharyngeal dysphagia but without aspiration. During oral phase of swallow, patient exhibited mild mastication and transit of bolus delays with regular solids as well as difficulty keeping bolus of thin liquid barium and barium tablet together. When give puree solids (applesauce),barium tablet then transited orally and pharyngeally without difficulty. Swallow initiation delay at level of vallecular sinus was observed with cup sips thin liquids and to vallecular heading into pyriform sinus delay with straw sips thin liquids. In addition, patient exhibited trace flash penetration  of thin liquids during straw sips but which cleared fully during swallow. Patient did have some periodic throat clearing which may have been due to the trace vallecular residuals post initial swallows but likely related to h/o GERD and esophageal dysfunction. Cervical esophageal phase of swallow revealed prominent cricopharyngeal bar, however this did not appear to impact patient's swallow function significantly. SLP recommending to continue with Regular solids, thin liquids diet. SLP Visit Diagnosis Dysphagia, oropharyngeal phase (R13.12) Attention and concentration deficit following -- Frontal lobe and executive function deficit following -- Impact on safety and function No limitations;Mild aspiration risk   CHL IP TREATMENT RECOMMENDATION 10/24/2020 Treatment Recommendations Therapy as outlined in treatment plan below   Prognosis 10/24/2020 Prognosis for Safe Diet Advancement Good Barriers to Reach Goals -- Barriers/Prognosis Comment -- CHL IP DIET RECOMMENDATION 10/24/2020 SLP Diet Recommendations Regular solids;Thin liquid Liquid Administration via Cup;Straw Medication Administration Whole meds with puree Compensations Slow rate;Small sips/bites Postural Changes --   CHL IP OTHER RECOMMENDATIONS 10/24/2020 Recommended Consults -- Oral Care Recommendations Oral care BID Other Recommendations --   CHL IP FOLLOW UP RECOMMENDATIONS 10/24/2020 Follow up Recommendations None   CHL IP FREQUENCY AND DURATION 10/24/2020 Speech Therapy Frequency (ACUTE ONLY) min 1 x/week Treatment Duration 1 week      CHL IP ORAL PHASE 10/24/2020 Oral Phase Impaired Oral - Pudding Teaspoon -- Oral - Pudding Cup -- Oral - Honey Teaspoon -- Oral - Honey Cup -- Oral - Nectar Teaspoon -- Oral - Nectar Cup -- Oral - Nectar Straw -- Oral - Thin Teaspoon -- Oral - Thin Cup WFL Oral - Thin Straw WFL Oral - Puree -- Oral - Mech Soft -- Oral - Regular Impaired mastication Oral - Multi-Consistency -- Oral - Pill Reduced posterior propulsion;Decreased bolus  cohesion Oral Phase - Comment --  CHL IP PHARYNGEAL PHASE 10/24/2020 Pharyngeal Phase Impaired Pharyngeal- Pudding Teaspoon -- Pharyngeal -- Pharyngeal- Pudding Cup -- Pharyngeal -- Pharyngeal- Honey Teaspoon -- Pharyngeal -- Pharyngeal- Honey Cup -- Pharyngeal -- Pharyngeal- Nectar Teaspoon -- Pharyngeal -- Pharyngeal- Nectar Cup -- Pharyngeal -- Pharyngeal- Nectar Straw -- Pharyngeal -- Pharyngeal- Thin Teaspoon --  Pharyngeal -- Pharyngeal- Thin Cup Delayed swallow initiation-vallecula;Pharyngeal residue - valleculae Pharyngeal -- Pharyngeal- Thin Straw Delayed swallow initiation-vallecula;Delayed swallow initiation-pyriform sinuses;Penetration/Aspiration during swallow;Pharyngeal residue - valleculae Pharyngeal Material enters airway, remains ABOVE vocal cords then ejected out Pharyngeal- Puree WFL Pharyngeal -- Pharyngeal- Mechanical Soft -- Pharyngeal -- Pharyngeal- Regular WFL Pharyngeal -- Pharyngeal- Multi-consistency -- Pharyngeal -- Pharyngeal- Pill WFL Pharyngeal -- Pharyngeal Comment --  CHL IP CERVICAL ESOPHAGEAL PHASE 10/24/2020 Cervical Esophageal Phase Impaired Pudding Teaspoon -- Pudding Cup -- Honey Teaspoon -- Honey Cup -- Nectar Teaspoon -- Nectar Cup -- Nectar Straw -- Thin Teaspoon -- Thin Cup -- Thin Straw -- Puree -- Mechanical Soft -- Regular -- Multi-consistency -- Pill -- Cervical Esophageal Comment Patient with prominent cricopharyngeal bar however it did not appear to be significantly impacting patient's swallow function Sonia Baller, MA, CCC-SLP Speech Therapy                  Scheduled Meds: . budesonide (PULMICORT) nebulizer solution  0.25 mg Nebulization BID  . enoxaparin (LOVENOX) injection  40 mg Subcutaneous Q24H  . ipratropium-albuterol  3 mL Nebulization TID  . levothyroxine  50 mcg Intravenous Daily  . lidocaine  1 patch Transdermal Q24H  . metoprolol tartrate  5 mg Intravenous Q6H  . pantoprazole (PROTONIX) IV  40 mg Intravenous Q24H   Continuous Infusions: .  azithromycin 500 mg (10/24/20 0938)  . cefTRIAXone (ROCEPHIN)  IV 2 g (10/24/20 0827)     LOS: 2 days    Time spent: 25 mins.More than 50% of that time was spent in counseling and/or coordination of care.      Shelly Coss, MD [facility] P5/08/2020, 8:10 AM

## 2020-10-26 LAB — CBC WITH DIFFERENTIAL/PLATELET
Abs Immature Granulocytes: 0.02 10*3/uL (ref 0.00–0.07)
Basophils Absolute: 0 10*3/uL (ref 0.0–0.1)
Basophils Relative: 0 %
Eosinophils Absolute: 0.2 10*3/uL (ref 0.0–0.5)
Eosinophils Relative: 2 %
HCT: 30.1 % — ABNORMAL LOW (ref 36.0–46.0)
Hemoglobin: 11.3 g/dL — ABNORMAL LOW (ref 12.0–15.0)
Immature Granulocytes: 0 %
Lymphocytes Relative: 10 %
Lymphs Abs: 0.8 10*3/uL (ref 0.7–4.0)
MCH: 33.2 pg (ref 26.0–34.0)
MCHC: 37.5 g/dL — ABNORMAL HIGH (ref 30.0–36.0)
MCV: 88.5 fL (ref 80.0–100.0)
Monocytes Absolute: 0.5 10*3/uL (ref 0.1–1.0)
Monocytes Relative: 7 %
Neutro Abs: 6.6 10*3/uL (ref 1.7–7.7)
Neutrophils Relative %: 81 %
Platelets: 209 10*3/uL (ref 150–400)
RBC: 3.4 MIL/uL — ABNORMAL LOW (ref 3.87–5.11)
RDW: 13.4 % (ref 11.5–15.5)
WBC: 8.2 10*3/uL (ref 4.0–10.5)
nRBC: 0 % (ref 0.0–0.2)

## 2020-10-26 LAB — BASIC METABOLIC PANEL
Anion gap: 8 (ref 5–15)
BUN: 11 mg/dL (ref 8–23)
CO2: 25 mmol/L (ref 22–32)
Calcium: 8.6 mg/dL — ABNORMAL LOW (ref 8.9–10.3)
Chloride: 98 mmol/L (ref 98–111)
Creatinine, Ser: 0.44 mg/dL (ref 0.44–1.00)
GFR, Estimated: >60 mL/min (ref >60)
Glucose, Bld: 91 mg/dL (ref 70–99)
Potassium: 3 mmol/L — ABNORMAL LOW (ref 3.5–5.1)
Sodium: 131 mmol/L — ABNORMAL LOW (ref 135–145)

## 2020-10-26 MED ORDER — POTASSIUM CHLORIDE CRYS ER 20 MEQ PO TBCR
40.0000 meq | EXTENDED_RELEASE_TABLET | ORAL | Status: AC
  Administered 2020-10-26 (×2): 40 meq via ORAL
  Filled 2020-10-26 (×2): qty 2

## 2020-10-26 NOTE — Progress Notes (Signed)
PROGRESS NOTE    [name]  [id number] DOB: [birth year] DOA: 10/22/2020 PCP: [name], MD   Chief Complain: Confusion  Brief Narrative: Patient is a 70 female with history of liver cirrhosis, recently diagnosed permanent A. fib, compression fracture of the thoracic spine status post kyphoplasty who was brought to the emergency department with complaints of confusion.  She was lethargic on presentation.  Her lab work showed severe hypokalemia.  CT head was unremarkable.  CT abdomen/pelvis showed consolidation in the right lower lung field concerning for pneumonia.  She was started on IV antibiotics for possible community-acquired pneumonia.  Currently mental status has improved.  PT/OT  Consulted and recommended SNF on DC.  Patient is medically stable for discharge to skilled facility as soon as bed is available.  Assessment & Plan:   Principal Problem:   Acute encephalopathy Active Problems:   HYPERTENSION, BENIGN ESSENTIAL   CIRRHOSIS, ALCOHOLIC, LIVER   Essential hypertension   Pneumonia   Acute encephalopathy: Currently alert and oriented but appears little foggy.  Encephalopathy could be multifactorial.  Most likely associated with narcotic use that she was taking for recent kyphoplasty.  NH3 levels were normal.  ABG did not show any hypercarbia. Suspicious for dementia.  As per the daughter, she is forgetful/confused at times, and this has been an ongoing problem.  Right lower lobe consolidation/suspected pneumonia: Currently respiratory status is stable.  Started on ceftriaxone azithromycin.  CT abdomen pelvis showed some consolidation of the right lower lung field, chest x-ray done did not show pneumonia.  Continue current antibiotics for now.  Aspiration was suspected, speech therapy consulted, underwent MBS without finding of any aspiration.  Procalcitonin reassuring.  Currently she is on room air. Follow-up chest x-ray on 5/22 showed bibasilar  atelectasis/infiltrate/pleural effusion.  Permanent A. fib: Continue Cardizem.  Not on anticoagulation because she was thought to be not a good candidate for that.  We recommend to follow-up with cardiology as an outpatient.  History of diastolic congestive heart failure: Currently euvolemic  Cirrhosis: Has history of liver cirrhosis with portal hypertension.  No finding of ascites as per CT abdomen/pelvis.  Ammonia level in the range of 30.  Needs to follow-up with GI as an outpatient.  Recent kyphoplasty for thoracic spine fracture: Taking opioids at home for pain relief.  PT/OT recommended SNF visit on discharge.Will recommend to minimize narcotics ,she still complains of back pain.  Continue Voltaren gel, lidocaine patch  Hypertension: Currently blood pressure stable.  Continue to monitor  Hypokalemia: Supplemented with potassium  Chronic hyponatremia: Stable/improved.  Urine sodium found to be less than 10.  Normal urine /plasma osmolality. Treated with gentle IV fluids        DVT prophylaxis:Lovenox Code Status: DNR Family Communication: Called and discussed with daughter on phone on 10/25/20 Status is: Inpatient  Remains inpatient appropriate because:Inpatient level of care appropriate due to severity of illness   Dispo: The patient is from: Home              Anticipated d/c is to:SNF              Patient currently is medically stable for discharge   Difficult to place patient No    Consultants: None  Procedures:None  Antimicrobials:  Anti-infectives (From admission, onward)   Start     Dose/Rate Route Frequency Ordered Stop   10/26/20 1000  azithromycin (ZITHROMAX) tablet 500 mg        500 mg Oral Daily 10/25/20 0958 10/28/20 0959  10/23/20 0800  cefTRIAXone (ROCEPHIN) 2 g in sodium chloride 0.9 % 100 mL IVPB        2 g 200 mL/hr over 30 Minutes Intravenous Every 24 hours 10/23/20 0443 10/28/20 0759   10/23/20 0800  azithromycin (ZITHROMAX) 500 mg in sodium  chloride 0.9 % 250 mL IVPB  Status:  Discontinued        500 mg 250 mL/hr over 60 Minutes Intravenous Every 24 hours 10/23/20 0443 10/25/20 0958   10/23/20 0130  piperacillin-tazobactam (ZOSYN) IVPB 3.375 g        3.375 g 100 mL/hr over 30 Minutes Intravenous  Once 10/23/20 0129 10/23/20 0237      Subjective:  Patient seen and examined the bedside this morning.  She looks much better today.  Sitting on the chair.  Pain is well controlled on the back.  Denies any shortness of breath or cough  Objective: Vitals:   10/25/20 1322 10/25/20 1944 10/25/20 2018 10/26/20 0648  BP:   121/85 (!) 160/114  Pulse:   97 (!) 103  Resp:   18 18  Temp:   (!) 97.4 F (36.3 C) 98.4 F (36.9 C)  TempSrc:   Oral Oral  SpO2: 96% 98% 98% 98%  Weight:      Height:        Intake/Output Summary (Last 24 hours) at 10/26/2020 0817 Last data filed at 10/26/2020 0500 Gross per 24 hour  Intake 855 ml  Output --  Net 855 ml   Filed Weights   10/22/20 2149  Weight: 79 kg    Examination:  General exam: Overall comfortable, not in distress, deconditioned, debilitated HEENT: PERRL Respiratory system: Diminished air sounds on the right side, no wheezes or crackles  Cardiovascular system: S1 & S2 heard, RRR.  Gastrointestinal system: Abdomen is nondistended, soft and nontender. Central nervous system: Alert and oriented Extremities: No edema, no clubbing ,no cyanosis Skin: No rashes, no ulcers,no icterus   Data Reviewed: I have personally reviewed following labs and imaging studies  CBC: Recent Labs  Lab 10/22/20 2235 10/22/20 2244 10/23/20 0510 10/26/20 0317  WBC  --  10.4 9.8 8.2  NEUTROABS  --  8.6* 8.4* 6.6  HGB 12.6 12.6 12.4 11.3*  HCT 37.0 34.0* 33.8* 30.1*  MCV  --  88.5 89.7 88.5  PLT  --  216 196 160   Basic Metabolic Panel: Recent Labs  Lab 10/22/20 2244 10/23/20 0510 10/24/20 1033 10/25/20 0926 10/26/20 0317  NA 130* 127* 131* 133* 131*  K 2.8* 3.9 4.0 3.5 3.0*  CL 92*  93* 97* 97* 98  CO2 26 25 27 27 25   GLUCOSE 79 70 110* 89 91  BUN 14 13 15 13 11   CREATININE 0.59 0.46 0.50 0.48 0.44  CALCIUM 9.2 8.4* 8.5* 8.7* 8.6*  MG 1.7 1.8  --   --   --    GFR: Estimated Creatinine Clearance: 65.6 mL/min (by C-G formula based on SCr of 0.44 mg/dL). Liver Function Tests: Recent Labs  Lab 10/22/20 2244 10/23/20 0510  AST 24 23  ALT 18 18  ALKPHOS 83 76  BILITOT 1.2 0.9  PROT 6.0* 5.8*  ALBUMIN 3.4* 3.3*   No results for input(s): LIPASE, AMYLASE in the last 168 hours. Recent Labs  Lab 10/22/20 2244 10/23/20 0510  AMMONIA 19 31   Coagulation Profile: No results for input(s): INR, PROTIME in the last 168 hours. Cardiac Enzymes: No results for input(s): CKTOTAL, CKMB, CKMBINDEX, TROPONINI in the last 168 hours.  BNP (last 3 results) No results for input(s): PROBNP in the last 8760 hours. HbA1C: No results for input(s): HGBA1C in the last 72 hours. CBG: Recent Labs  Lab 10/23/20 1412 10/23/20 1610 10/23/20 2337 10/24/20 0520 10/24/20 1301  GLUCAP 88 156* 134* 100* 125*   Lipid Profile: No results for input(s): CHOL, HDL, LDLCALC, TRIG, CHOLHDL, LDLDIRECT in the last 72 hours. Thyroid Function Tests: No results for input(s): TSH, T4TOTAL, FREET4, T3FREE, THYROIDAB in the last 72 hours. Anemia Panel: No results for input(s): VITAMINB12, FOLATE, FERRITIN, TIBC, IRON, RETICCTPCT in the last 72 hours. Sepsis Labs: Recent Labs  Lab 10/22/20 2213 10/23/20 1251  PROCALCITON  --  <0.10  LATICACIDVEN 0.7  --     Recent Results (from the past 240 hour(s))  Blood Cultures (routine x 2)     Status: None (Preliminary result)   Collection Time: 10/22/20 10:13 PM   Specimen: BLOOD  Result Value Ref Range Status   Specimen Description   Final    BLOOD RIGHT ANTECUBITAL Performed at [facility] [address] [place], [place] [address]    Special Requests   Final    BOTTLES DRAWN AEROBIC AND ANAEROBIC Blood Culture adequate  volume Performed at [facility] [address], [place], [place] [address]    Culture   Final    NO GROWTH 2 DAYS Performed at [facility] [address], [place], [place] [address]    Report Status PENDING  Incomplete  Blood Cultures (routine x 2)     Status: None (Preliminary result)   Collection Time: 10/22/20 10:18 PM   Specimen: BLOOD  Result Value Ref Range Status   Specimen Description   Final    BLOOD LEFT ANTECUBITAL Performed at [facility] [address], [place], [place] [address]    Special Requests   Final    BOTTLES DRAWN AEROBIC AND ANAEROBIC Blood Culture adequate volume Performed at [facility] [address], [place], [place] [address]    Culture   Final    NO GROWTH 2 DAYS Performed at [facility] [address] [place], [place] [address]    Report Status PENDING  Incomplete  Resp Panel by RT-PCR (Flu A&B, Covid) Nasopharyngeal Swab     Status: None   Collection Time: 10/23/20  1:22 AM   Specimen: Nasopharyngeal Swab; Nasopharyngeal(NP) swabs in vial transport medium  Result Value Ref Range Status   SARS Coronavirus 2 by RT PCR NEGATIVE NEGATIVE Final    Comment: (NOTE) SARS-CoV-2 target nucleic acids are NOT DETECTED.  The SARS-CoV-2 RNA is generally detectable in upper respiratory specimens during the acute phase of infection. The lowest concentration of SARS-CoV-2 viral copies this assay can detect is 138 copies/mL. A negative result does not preclude SARS-Cov-2 infection and should not be used as the sole basis for treatment or other patient management decisions. A negative result may occur with  improper specimen collection/handling, submission of specimen other than nasopharyngeal swab, presence of viral mutation(s) within the areas targeted by this assay, and inadequate number of viral copies(<138 copies/mL). A negative result must be combined with clinical  observations, patient history, and epidemiological information. The expected result is Negative.  Fact Sheet for Patients:  EntrepreneurPulse.com.au  Fact Sheet for Healthcare Providers:  IncredibleEmployment.be  This test is no t yet approved or cleared by the [place] FDA and  has been authorized for detection and/or diagnosis of SARS-CoV-2 by FDA  under an Emergency Use Authorization (EUA). This EUA will remain  in effect (meaning this test can be used) for the duration of the COVID-19 declaration under Section 564(b)(1) of the Act, 21 U.S.C.section 360bbb-3(b)(1), unless the authorization is terminated  or revoked sooner.       Influenza A by PCR NEGATIVE NEGATIVE Final   Influenza B by PCR NEGATIVE NEGATIVE Final    Comment: (NOTE) The Xpert Xpress SARS-CoV-2/FLU/RSV plus assay is intended as an aid in the diagnosis of influenza from Nasopharyngeal swab specimens and should not be used as a sole basis for treatment. Nasal washings and aspirates are unacceptable for Xpert Xpress SARS-CoV-2/FLU/RSV testing.  Fact Sheet for Patients: EntrepreneurPulse.com.au  Fact Sheet for Healthcare Providers: IncredibleEmployment.be  This test is not yet approved or cleared by the [place] FDA and has been authorized for detection and/or diagnosis of SARS-CoV-2 by FDA under an Emergency Use Authorization (EUA). This EUA will remain in effect (meaning this test can be used) for the duration of the COVID-19 declaration under Section 564(b)(1) of the Act, 21 U.S.C. section 360bbb-3(b)(1), unless the authorization is terminated or revoked.  Performed at [facility], [place] [address] [place], [place] [address]          Radiology Studies: DG CHEST PORT 1 VIEW  Result Date: 10/25/2020 CLINICAL DATA:  Shortness of breath. EXAM: PORTABLE CHEST 1 VIEW COMPARISON:  Chest x-ray 10/22/2020.   CT 10/13/2020. FINDINGS: Cardiomegaly. Low lung volumes with bibasilar infiltrates/edema and bilateral pleural effusions. No pneumothorax. Postsurgical changes right shoulder and thoracic spine. IMPRESSION: Cardiomegaly. Low lung volumes with bibasilar infiltrates/edema and bilateral pleural effusions. Findings progressed from prior exam. Electronically Signed   By: Marcello Moores  Register   On: 10/25/2020 05:39   DG Swallowing Func-Speech Pathology  Result Date: 10/24/2020 Objective Swallowing Evaluation: Type of Study: MBS-Modified Barium Swallow Study  Patient Details Name: KYLY BAERT MRN: [id number] Date of Birth: 05/03/1951 Today's Date: 10/24/2020 Time: SLP Start Time (ACUTE ONLY): 78 -SLP Stop Time (ACUTE ONLY): 1230 SLP Time Calculation (min) (ACUTE ONLY): 10 min Past Medical History: Past Medical History: Diagnosis Date . ACROCHORDON 03/16/2010 . GI bleeding 06/2018 . Heart murmur  . Hypertension  . Hypothyroidism  . Liver cirrhosis ([place])  . Multiple duodenal ulcers 07/16/2018  associated with melena, anemia, gastritis. due to NSAID.  hx gastric ulcers and bleeding 2004, 2007 . Thyroid disease   S/p Radiation Past Surgical History: Past Surgical History: Procedure Laterality Date . BIOPSY  07/16/2018  Procedure: BIOPSY;  Surgeon: Mauri Pole, MD;  Location: [facility];  Service: Endoscopy;; . BREAST EXCISIONAL BIOPSY Left   benign . CHOLECYSTECTOMY N/A 12/07/2015  Procedure: LAPAROSCOPIC CHOLECYSTECTOMY WITH INTRAOPERATIVE CHOLANGIOGRAM;  Surgeon: Alphonsa Overall, MD;  Location: [facility];  Service: General;  Laterality: N/A; . ERCP N/A 12/06/2015  Procedure: ENDOSCOPIC RETROGRADE CHOLANGIOPANCREATOGRAPHY (ERCP);  Surgeon: Milus Banister, MD;  Location: [facility];  Service: Endoscopy;  Laterality: N/A; . ESOPHAGOGASTRODUODENOSCOPY (EGD) WITH PROPOFOL N/A 07/16/2018  Procedure: ESOPHAGOGASTRODUODENOSCOPY (EGD) WITH PROPOFOL;  Surgeon: Mauri Pole, MD;  Location: [facility];  Service: Endoscopy;   Laterality: N/A; . IM NAILING TIBIA Right 07/19/2012  DR Maureen Ralphs . IR VERTEBROPLASTY CERV/THOR BX INC UNI/BIL INC/INJECT/IMAGING  10/17/2020 . IR VERTEBROPLASTY EA ADDL (T&LS) BX INC UNI/BIL INC INJECT/IMAGING  10/17/2020 . SHOULDER HEMI-ARTHROPLASTY Right 07/22/2013  Procedure: RIGHT SHOULDER HEMI-ARTHROPLASTY;  Surgeon: Augustin Schooling, MD;  Location: [facility];  Service: Orthopedics;  Laterality: Right; . TIBIA IM NAIL INSERTION Right 07/19/2013  Procedure: INTRAMEDULLARY (IM) NAIL TIBIAL;  Surgeon: Gearlean Alf, MD;  Location: [facility];  Service: Orthopedics;  Laterality: Right; . TUBAL LIGATION   HPI: Patient is a 70 y.o. female with PMH: liver cirrhosis, recently diagnosed with afib and compression fracture of thoracic spince during which patient underwent kyphoplasty and has been on narcotic pain relief medication, EGD in 2020 found multiple partially obstructing duodenal ulcers with adherent clot; suspicious for NSAID induced etiology. She was brought to ER secondary to family observing increasing confusion. In ER, patient was c/o abdominal discomfort and found to be lethargic when MD was examining her. CT head was unremarkable but CT abdomen, pelvis showed worsening consolidation  in right lower lung field concerning for PNA. COVID test negative.  Subjective: alert, cooperative Assessment / Plan / Recommendation CHL IP CLINICAL IMPRESSIONS 10/24/2020 Clinical Impression Patient presents with a mild oropharyngeal dysphagia but without aspiration. During oral phase of swallow, patient exhibited mild mastication and transit of bolus delays with regular solids as well as difficulty keeping bolus of thin liquid barium and barium tablet together. When give puree solids (applesauce),barium tablet then transited orally and pharyngeally without difficulty. Swallow initiation delay at level of vallecular sinus was observed with cup sips thin liquids and to vallecular heading into pyriform sinus delay with straw sips thin  liquids. In addition, patient exhibited trace flash penetration of thin liquids during straw sips but which cleared fully during swallow. Patient did have some periodic throat clearing which may have been due to the trace vallecular residuals post initial swallows but likely related to h/o GERD and esophageal dysfunction. Cervical esophageal phase of swallow revealed prominent cricopharyngeal bar, however this did not appear to impact patient's swallow function significantly. SLP recommending to continue with Regular solids, thin liquids diet. SLP Visit Diagnosis Dysphagia, oropharyngeal phase (R13.12) Attention and concentration deficit following -- Frontal lobe and executive function deficit following -- Impact on safety and function No limitations;Mild aspiration risk   CHL IP TREATMENT RECOMMENDATION 10/24/2020 Treatment Recommendations Therapy as outlined in treatment plan below   Prognosis 10/24/2020 Prognosis for Safe Diet Advancement Good Barriers to Reach Goals -- Barriers/Prognosis Comment -- CHL IP DIET RECOMMENDATION 10/24/2020 SLP Diet Recommendations Regular solids;Thin liquid Liquid Administration via Cup;Straw Medication Administration Whole meds with puree Compensations Slow rate;Small sips/bites Postural Changes --   CHL IP OTHER RECOMMENDATIONS 10/24/2020 Recommended Consults -- Oral Care Recommendations Oral care BID Other Recommendations --   CHL IP FOLLOW UP RECOMMENDATIONS 10/24/2020 Follow up Recommendations None   CHL IP FREQUENCY AND DURATION 10/24/2020 Speech Therapy Frequency (ACUTE ONLY) min 1 x/week Treatment Duration 1 week      CHL IP ORAL PHASE 10/24/2020 Oral Phase Impaired Oral - Pudding Teaspoon -- Oral - Pudding Cup -- Oral - Honey Teaspoon -- Oral - Honey Cup -- Oral - Nectar Teaspoon -- Oral - Nectar Cup -- Oral - Nectar Straw -- Oral - Thin Teaspoon -- Oral - Thin Cup WFL Oral - Thin Straw WFL Oral - Puree -- Oral - Mech Soft -- Oral - Regular Impaired mastication Oral - Multi-Consistency  -- Oral - Pill Reduced posterior propulsion;Decreased bolus cohesion Oral Phase - Comment --  CHL IP PHARYNGEAL PHASE 10/24/2020 Pharyngeal Phase Impaired Pharyngeal- Pudding Teaspoon -- Pharyngeal -- Pharyngeal- Pudding Cup -- Pharyngeal -- Pharyngeal- Honey Teaspoon -- Pharyngeal -- Pharyngeal- Honey Cup -- Pharyngeal -- Pharyngeal- Nectar Teaspoon -- Pharyngeal -- Pharyngeal- Nectar Cup -- Pharyngeal -- Pharyngeal- Nectar Straw -- Pharyngeal -- Pharyngeal- Thin Teaspoon -- Pharyngeal --  Pharyngeal- Thin Cup Delayed swallow initiation-vallecula;Pharyngeal residue - valleculae Pharyngeal -- Pharyngeal- Thin Straw Delayed swallow initiation-vallecula;Delayed swallow initiation-pyriform sinuses;Penetration/Aspiration during swallow;Pharyngeal residue - valleculae Pharyngeal Material enters airway, remains ABOVE vocal cords then ejected out Pharyngeal- Puree WFL Pharyngeal -- Pharyngeal- Mechanical Soft -- Pharyngeal -- Pharyngeal- Regular WFL Pharyngeal -- Pharyngeal- Multi-consistency -- Pharyngeal -- Pharyngeal- Pill WFL Pharyngeal -- Pharyngeal Comment --  CHL IP CERVICAL ESOPHAGEAL PHASE 10/24/2020 Cervical Esophageal Phase Impaired Pudding Teaspoon -- Pudding Cup -- Honey Teaspoon -- Honey Cup -- Nectar Teaspoon -- Nectar Cup -- Nectar Straw -- Thin Teaspoon -- Thin Cup -- Thin Straw -- Puree -- Mechanical Soft -- Regular -- Multi-consistency -- Pill -- Cervical Esophageal Comment Patient with prominent cricopharyngeal bar however it did not appear to be significantly impacting patient's swallow function Sonia Baller, MA, CCC-SLP Speech Therapy                  Scheduled Meds: . azithromycin  500 mg Oral Daily  . budesonide (PULMICORT) nebulizer solution  0.25 mg Nebulization BID  . diclofenac Sodium  4 g Topical QID  . diltiazem  120 mg Oral Daily  . enoxaparin (LOVENOX) injection  40 mg Subcutaneous Q24H  . hydrALAZINE  75 mg Oral TID  . ipratropium-albuterol  3 mL Nebulization TID  .  levothyroxine  100 mcg Oral Q0600  . lidocaine  1 patch Transdermal Q24H  . pantoprazole  40 mg Oral Daily   Continuous Infusions: . cefTRIAXone (ROCEPHIN)  IV 2 g (10/25/20 0830)     LOS: 3 days    Time spent: 25 mins.More than 50% of that time was spent in counseling and/or coordination of care.      Shelly Coss, MD [facility] P5/09/2020, 8:17 AM

## 2020-10-26 NOTE — TOC Initial Note (Addendum)
Transition of Care [facility]) - Initial/Assessment Note    Patient Details  Name: [name] MRN: [id number] Date of Birth: 03-Jul-1950  Transition of [facility]) CM/SW Contact:    Ross Ludwig, LCSW Phone Number: 10/26/2020, 6:17 PM  Clinical Narrative:                  CSW was informed that patient needs to go to SNF for short term rehab.  CSW spoke to patient's daughter [name] [phone] to provide bed offers, she requested that CSW call her back.  CSW attempted to call back and had to leave a message.  CSW attempted to contact patient's other daughter [name], and CSW had to leave a message on her voice mail as well.   CSW to follow up with bed offers tomorrow.  6:34  CSW spoke to patient's daughter [name], and provided bed offers.  Patient's daughter chose [facility].  CSW to confirm bed availability on Thursday Morning.  Patient waiting for insurance authorization.  Expected Discharge Plan: Skilled Nursing Facility Barriers to Discharge: Insurance Authorization   Patient Goals and CMS Choice Patient states their goals for this hospitalization and ongoing recovery are:: To go to SNF for short term rehab, then return back home. CMS Medicare.gov Compare Post Acute Care list provided to:: Patient Represenative (must comment) Choice offered to / list presented to : Adult Children  Expected Discharge Plan and Services Expected Discharge Plan: [place] Choice: [place] arrangements for the past 2 months: Single Family Home                                      Prior Living Arrangements/Services Living arrangements for the past 2 months: Single Family Home Lives with:: Adult Children Patient language and need for interpreter reviewed:: Yes Do you feel safe going back to the place where you live?: No   Patient needs short term rehab, before return back home.  Need for Family Participation in Patient Care:  Yes (Comment) Care giver support system in place?: No (comment)   Criminal Activity/Legal Involvement Pertinent to Current Situation/Hospitalization: No - Comment as needed  Activities of Daily Living Home Assistive Devices/Equipment: Walker (specify type),Cane (specify quad or straight),Shower chair with back ADL Screening (condition at time of admission) Patient's cognitive ability adequate to safely complete daily activities?: Yes Is the patient deaf or have difficulty hearing?: No Does the patient have difficulty seeing, even when wearing glasses/contacts?: No Does the patient have difficulty concentrating, remembering, or making decisions?: No Patient able to express need for assistance with ADLs?: Yes Does the patient have difficulty dressing or bathing?: No Independently performs ADLs?: Yes (appropriate for developmental age) Does the patient have difficulty walking or climbing stairs?: No Weakness of Legs: None Weakness of Arms/Hands: None  Permission Sought/Granted Permission sought to share information with : Case Manager,Family Chief Financial Officer Permission granted to share information with : Yes, Verbal Permission Granted  Share Information with NAME: [name] Daughter   [phone]  Murray,Taylor Daughter   [phone]  Permission granted to share info w AGENCY: SNF admissions        Emotional Assessment Appearance:: Appears stated age   Affect (typically observed): Accepting,Stable Orientation: : Oriented to Self,Oriented to Place,Oriented to  Time,Oriented to Situation Alcohol / Substance Use: Not Applicable Psych Involvement: No (comment)  Admission diagnosis:  Transient alteration of  awareness [R40.4] Pneumonia [J18.9] Acute encephalopathy [G93.40] Patient Active Problem List   Diagnosis Date Noted  . Pneumonia 10/23/2020  . Acute encephalopathy 10/23/2020  . Hypokalemia due to excessive gastrointestinal loss of potassium  10/08/2020  . Abdominal pain 10/08/2020  . Back pain 10/08/2020  . Hypomagnesemia 10/08/2020  . Compression fracture of body of thoracic vertebra ([place]) 10/08/2020  . History of cirrhosis of liver 10/08/2020  . Irregular heart beat 05/04/2020  . Duodenal ulcer   . GI bleed 07/15/2018  . Leukocytosis 07/15/2018  . Hyponatremia 07/15/2018  . Essential hypertension 07/15/2018  . Hypothyroidism 07/15/2018  . Melena   . Acute gallstone pancreatitis   . Gallstone pancreatitis 12/05/2015  . Acute pancreatitis 12/05/2015  . Choledocholithiasis   . Fracture of proximal end of right humerus 07/22/2013  . Tibia/fibula fracture 07/18/2013  . Mood disorder ([place]) 05/19/2013  . Undiagnosed cardiac murmurs 04/13/2013  . Preventive measure 04/13/2013  . Bradycardia 11/21/2011  . Sleep difficulties 11/21/2011  . HYPERTENSION, BENIGN ESSENTIAL 03/22/2009  . TOBACCO ABUSE 10/01/2007  . HYPOTHYROIDISM, POST-RADIOACTIVE IODINE 06/13/2007  . CIRRHOSIS, ALCOHOLIC, LIVER 93/73/4287   PCP:  [name], MD Pharmacy:   [facility] [address] [place]), [place] - [address] [place] [address] Phone: [phone] Fax: [phone]     Social Determinants of Health (SDOH) Interventions    Readmission Risk Interventions No flowsheet data found.

## 2020-10-26 NOTE — Progress Notes (Signed)
  Speech Language Pathology Treatment: Dysphagia  Patient Details Name: [name] MRN: [id number] DOB: 11-09-1950 Today's Date: 10/26/2020 Time: 3151-7616 SLP Time Calculation (min) (ACUTE ONLY): 9 min  Assessment / Plan / Recommendation Clinical Impression  Pt seen to assure po tolerance s/p MBS, assisted with lunch tray set up and provided pt with Ensure and saltines to accompany her broth given her displeasure with her grilled cheese.  Observed pt consuming Ensure and grilled cheese - no indication of dysphagia.   Reviewed findings of MBS from 5/2 with pt including trace laryngeal penetration of thin being [name] [employer] for age and notation of prominent cricopharyngeus that did not impair barium flow. Pt is currently taking a PPI and denies reflux symptoms.    She confirms pain in her left side - which she states is chronic due to her compression fractures.  This pain likely decreases her mobility and cough strength and increases pna risk. Advised pt follow general esophageal-reflux precautions given h/o gastritis, GI bleed and gastric ulcers 06/2018- provided in writing using teach back.  Pt denies frequent throat clearing, coughing, globus etc with po intake. Today pt is fully alert and participative - during initial evaluation, she was sleepy.    Educated pt to importance of consuming po only when respiratory status is adequate - cease po if dyspneic due to potential impairment of respiratory/swallow reciprocity allowing episodic aspiration.  Note pt continues to smoker per MD notes = Requested RN provide pt with incentive spirometer for her to use due to decreased mobility and pna.  All education completed and pt clinically is tolerating po diet.  No SLP follow up.    HPI HPI: Patient is a 70 y.o. female with PMH: liver cirrhosis, recently diagnosed with afib and compression fracture of thoracic spince during which patient underwent kyphoplasty and has been on narcotic pain relief medication, EGD in  2020 found multiple partially obstructing duodenal ulcers with adherent clot; suspicious for NSAID induced etiology. She was brought to ER secondary to family observing increasing confusion. In ER, patient was c/o abdominal discomfort and found to be lethargic when MD was examining her. CT head was unremarkable but CT abdomen, pelvis showed worsening consolidation  in right lower lung field concerning for PNA. COVID test negative.  Follow up to assure po tolerance indicated after MBS.      SLP Plan  All goals met       Recommendations  Diet recommendations: Regular;Thin liquid Liquids provided via: Cup;Straw Medication Administration: Whole meds with liquid (several at a time taken by pt with good tolerance) Compensations: Slow rate;Small sips/bites Postural Changes and/or Swallow Maneuvers: Seated upright 90 degrees;Upright 30-60 min after meal                Oral Care Recommendations: Oral care BID Follow up Recommendations: None SLP Visit Diagnosis: Dysphagia, oropharyngeal phase (R13.12) Plan: All goals met       GO                [name] 10/26/2020, 2:52 PM   Taylor Lime, MS [facility] SLP Acute Rehab Services Office [phone] Pager [phone]

## 2020-10-26 NOTE — Evaluation (Signed)
Occupational Therapy Evaluation Patient Details Name: [name] MRN: [id number] DOB: [birth year] Today's Date: 10/26/2020    History of Present Illness Patient is a 70 y/o female admitted for Acute encephalopathy: possibly multifactorial, Most likely associated with narcotic use that she was taking for recent kyphoplasty.  Recent admission and found to have T10, T8 T12 and L1 compression fractures, s/p vertebroplasty 10/17/20.  PMH includes liver cirrhosis secondary to alcohol abuse, HTN.   Clinical Impression   [name] is a 70 year old woman who presents with generalized weakness, decreased activity tolerance, impaired balance, altered mental status and pain. Patient was independent prior to original back pain incident and has struggled and needed assistance from daughter since return home. Today patient requiring min assist to stand, is continent of bowels, needs assistance for LB ADLs and min guard to ambulate to toilet. Patient is unable to state any spinal precautions and is unable to accurately perform log roll technique. Patient will benefit from skilled OT services while in hospital to improve deficits and learn compensatory strategies as needed in order to improve functional abilities and safety. Recommend short term rehab at discharge prior to return home.       Follow Up Recommendations  SNF    Equipment Recommendations  None recommended by OT    Recommendations for Other Services       Precautions / Restrictions Precautions Precautions: Back;Fall Precaution Booklet Issued: No Precaution Comments: reviewed back precautions recent Kyphplasty Restrictions Weight Bearing Restrictions: No      Mobility Bed Mobility Overal bed mobility: Needs Assistance Bed Mobility: Supine to Sit Rolling: Min assist Sidelying to sit: Min assist     Sit to sidelying: Min assist General bed mobility comments: Min assist for bed mobility for tactile cues on how to perform  log roll technique correctly.    Transfers Overall transfer level: Needs assistance Equipment used: Rolling walker (2 wheeled) Transfers: Sit to/from Stand Sit to Stand: Min assist         General transfer comment: MIn assist to stand from bed and toilet. Min guard to ambulate to bathroom and back to bed. Incontinent of BM partial way to bathroom.    Balance Overall balance assessment: Needs assistance Sitting-balance support: No upper extremity supported Sitting balance-Leahy Scale: Fair     Standing balance support: Bilateral upper extremity supported;During functional activity Standing balance-Leahy Scale: Fair                             ADL either performed or assessed with clinical judgement   ADL Overall ADL's : Needs assistance/impaired Eating/Feeding: Independent   Grooming: Set up;Sitting   Upper Body Bathing: Set up;Sitting   Lower Body Bathing: Moderate assistance;Sit to/from stand   Upper Body Dressing : Set up;Sitting   Lower Body Dressing: Moderate assistance;Sit to/from stand   Toilet Transfer: Minimal assistance;Regular Toilet;Grab bars;RW Armed forces technical officer Details (indicate cue type and reason): min assist to rise from toilet Toileting- Clothing Manipulation and Hygiene: Maximal assistance;Sit to/from stand               Vision Patient Visual Report: No change from baseline Vision Assessment?: No apparent visual deficits     Perception     Praxis      Pertinent Vitals/Pain Pain Assessment: Faces Faces Pain Scale: Hurts a little bit Pain Location: left side - reports this to be normal due to her Pain Descriptors / Indicators: Sore;Aching;Grimacing Pain Intervention(s):  Monitored during session     Hand Dominance Right   Extremity/Trunk Assessment Upper Extremity Assessment Upper Extremity Assessment: RUE deficits/detail;LUE deficits/detail;Generalized weakness RUE Deficits / Details: Impaired shoulder ROM on right from  prior fracture otherwise functional ROM, generalized weakness RUE Sensation: WNL RUE Coordination: WNL LUE Deficits / Details: WFL ROM, generalized weakness LUE Sensation: WNL LUE Coordination: WNL   Lower Extremity Assessment Lower Extremity Assessment: Defer to PT evaluation   Cervical / Trunk Assessment Cervical / Trunk Assessment: Kyphotic   Communication Communication Communication: No difficulties   Cognition Arousal/Alertness: Awake/alert Behavior During Therapy: WFL for tasks assessed/performed Overall Cognitive Status: No family/caregiver present to determine baseline cognitive functioning Area of Impairment: Awareness;Problem solving                     Memory: Decreased short-term memory;Decreased recall of precautions     Awareness: Emergent Problem Solving: Slow processing;Requires verbal cues General Comments: Slow processing. Doesn't appear to remember any back precautions since surgery.   General Comments       Exercises     Shoulder Instructions      Home Living Family/patient expects to be discharged to:: Private residence Living Arrangements: Children (daughter) Available Help at Discharge: Family;Available 24 hours/day Type of Home: House Home Access: Stairs to enter [employer] of Steps: 5-6 Entrance Stairs-Rails: Right Home Layout: One level     Bathroom Shower/Tub: Tub/shower unit         Home Equipment: Cane - single point;Walker - 2 wheels;Shower seat;Grab bars - tub/shower;Wheelchair - manual          Prior Functioning/Environment Level of Independence: Needs assistance  Gait / Transfers Assistance Needed: Uses SPC for ambulation prior to KP, pt reports daughter assists her OOB to w/c since return home from admission with KP ADL's / Homemaking Assistance Needed: typically Ind with selfcare/ADLs however more assist after previous admission. Daughter does IADLs and cooking/driving.            OT Problem  List: Impaired balance (sitting and/or standing);Decreased strength;Pain;Decreased activity tolerance;Decreased knowledge of use of DME or AE;Decreased knowledge of precautions;Cardiopulmonary status limiting activity;Decreased cognition      OT Treatment/Interventions: Self-care/ADL training;DME and/or AE instruction;Therapeutic activities;Balance training;Therapeutic exercise;Patient/family education    OT Goals(Current goals can be found in the care plan section) Acute Rehab OT Goals Patient Stated Goal: return to normal OT Goal Formulation: With patient Time For Goal Achievement: 11/09/20 Potential to Achieve Goals: Good  OT Frequency: Min 2X/week   Barriers to D/C:            Co-evaluation              AM-PAC OT "6 Clicks" Daily Activity     Outcome Measure Help from another person eating meals?: None Help from another person taking care of personal grooming?: A Little Help from another person toileting, which includes using toliet, bedpan, or urinal?: A Lot Help from another person bathing (including washing, rinsing, drying)?: A Lot Help from another person to put on and taking off regular upper body clothing?: A Little Help from another person to put on and taking off regular lower body clothing?: A Lot 6 Click Score: 16   End of Session Equipment Utilized During Treatment: Rolling walker Nurse Communication:  (HR)  Activity Tolerance: Patient tolerated treatment well Patient left: in bed;with call bell/phone within reach;with bed alarm set  OT Visit Diagnosis: Other abnormalities of gait and mobility (R26.89);Pain  Time: 9450-3888 OT Time Calculation (min): 29 min Charges:  OT General Charges $OT Visit: 1 Visit OT Evaluation $OT Eval Low Complexity: 1 Low OT Treatments $Self Care/Home Management : 8-22 mins  Miloh Alcocer, OTR/L [place]  Office [phone] Pager: [place] 10/26/2020, 3:33 PM

## 2020-10-26 NOTE — Progress Notes (Signed)
Chaplain offered prayer to [name].  She expressed that her level of pain has been her main concern.  She is also thinking about going to rehab.  She desires to be in a facility that really cares about her as a human being.  [name] is looking forward to completing rehab and making it back home to her family and fur babies (dogs).  Chaplain offered listening, prayer, and support.    10/26/20 1200  Clinical Encounter Type  Visited With Patient  Visit Type Spiritual support  Spiritual Encounters  Spiritual Needs Prayer

## 2020-10-26 NOTE — NC FL2 (Signed)
[place] LEVEL OF CARE SCREENING TOOL     IDENTIFICATION  Patient Name: [name] Birthdate: March 04, 1951 Sex: female Admission Date (Current Location): 10/22/2020  [facility] and [place] Number:  Herbalist and Address:  [facility],  [address], [place]      Provider Number: [id number]  Attending Physician Name and Address:  Shelly Coss, MD  Relative Name and Phone Number:  [name] Daughter   [phone]  Code,Amanda Daughter   [phone]    Current Level of Care: Hospital Recommended Level of Care: [place] Prior Approval Number:    Date Approved/Denied:   PASRR Number: [id number]  Discharge Plan: SNF    Current Diagnoses: Patient Active Problem List   Diagnosis Date Noted  . Pneumonia 10/23/2020  . Acute encephalopathy 10/23/2020  . Hypokalemia due to excessive gastrointestinal loss of potassium 10/08/2020  . Abdominal pain 10/08/2020  . Back pain 10/08/2020  . Hypomagnesemia 10/08/2020  . Compression fracture of body of thoracic vertebra ([place]) 10/08/2020  . History of cirrhosis of liver 10/08/2020  . Irregular heart beat 05/04/2020  . Duodenal ulcer   . GI bleed 07/15/2018  . Leukocytosis 07/15/2018  . Hyponatremia 07/15/2018  . Essential hypertension 07/15/2018  . Hypothyroidism 07/15/2018  . Melena   . Acute gallstone pancreatitis   . Gallstone pancreatitis 12/05/2015  . Acute pancreatitis 12/05/2015  . Choledocholithiasis   . Fracture of proximal end of right humerus 07/22/2013  . Tibia/fibula fracture 07/18/2013  . Mood disorder ([place]) 05/19/2013  . Undiagnosed cardiac murmurs 04/13/2013  . Preventive measure 04/13/2013  . Bradycardia 11/21/2011  . Sleep difficulties 11/21/2011  . HYPERTENSION, BENIGN ESSENTIAL 03/22/2009  . TOBACCO ABUSE 10/01/2007  . HYPOTHYROIDISM, POST-RADIOACTIVE IODINE 06/13/2007  . CIRRHOSIS, ALCOHOLIC, LIVER 95/18/8416     Orientation RESPIRATION BLADDER Height & Weight     Self,Time,Situation,Place  Normal Continent Weight: 174 lb 2.6 oz (79 kg) Height:  5\' 4"  (162.6 cm)  BEHAVIORAL SYMPTOMS/MOOD NEUROLOGICAL BOWEL NUTRITION STATUS      Continent Diet (Regular)  AMBULATORY STATUS COMMUNICATION OF NEEDS Skin   Limited Assist Verbally Surgical wounds                       Personal Care Assistance Level of Assistance  Bathing,Feeding,Dressing Bathing Assistance: Limited assistance Feeding assistance: Independent Dressing Assistance: Limited assistance     Functional Limitations Info  Sight,Speech,Hearing Sight Info: Adequate Hearing Info: Adequate Speech Info: Adequate    SPECIAL CARE FACTORS FREQUENCY  OT (By licensed OT),PT (By licensed PT)     PT Frequency: Minimum 5x a week OT Frequency: Minimum 5x a week            Contractures Contractures Info: Not present    Additional Factors Info  Code Status,Allergies Code Status Info: DNR Allergies Info: NKA           Current Medications (10/26/2020):  This is the current hospital active medication list Current Facility-Administered Medications  Medication Dose Route Frequency Provider Last Rate Last Admin  . acetaminophen (TYLENOL) tablet 650 mg  650 mg Oral Q6H PRN Rise Patience, MD   650 mg at 10/26/20 1058   Or  . acetaminophen (TYLENOL) suppository 650 mg  650 mg Rectal Q6H PRN Rise Patience, MD      . azithromycin [facility]) tablet 500 mg  500 mg Oral Daily Susa Raring, RPH   500 mg at 10/26/20 1058  . budesonide (PULMICORT)  nebulizer solution 0.25 mg  0.25 mg Nebulization BID Dana Allan I, MD   0.25 mg at 10/26/20 0846  . cefTRIAXone (ROCEPHIN) 2 g in sodium chloride 0.9 % 100 mL IVPB  2 g Intravenous Q24H Rise Patience, MD 200 mL/hr at 10/26/20 1056 2 g at 10/26/20 1056  . diclofenac Sodium (VOLTAREN) 1 % topical gel 4 g  4 g Topical QID Shelly Coss, MD   4 g at 10/26/20 1100  .  diltiazem (CARDIZEM CD) 24 hr capsule 120 mg  120 mg Oral Daily Adhikari, Amrit, MD   120 mg at 10/26/20 1057  . enoxaparin (LOVENOX) injection 40 mg  40 mg Subcutaneous Q24H Rise Patience, MD   40 mg at 10/26/20 1059  . hydrALAZINE (APRESOLINE) tablet 75 mg  75 mg Oral TID Shelly Coss, MD   75 mg at 10/26/20 1057  . ipratropium-albuterol (DUONEB) 0.5-2.5 (3) MG/3ML nebulizer solution 3 mL  3 mL Nebulization TID Shelly Coss, MD   3 mL at 10/26/20 0846  . levalbuterol (XOPENEX) nebulizer solution 0.63 mg  0.63 mg Nebulization Q6H PRN Dana Allan I, MD      . levothyroxine (SYNTHROID) tablet 100 mcg  100 mcg Oral Q0600 Shelly Coss, MD   100 mcg at 10/26/20 0618  . lidocaine (LIDODERM) 5 % 1 patch  1 patch Transdermal Q24H Shelly Coss, MD   1 patch at 10/25/20 1748  . pantoprazole (PROTONIX) EC tablet 40 mg  40 mg Oral Daily Shelly Coss, MD   40 mg at 10/26/20 1057  . potassium chloride SA (KLOR-CON) CR tablet 40 mEq  40 mEq Oral Q4H Shelly Coss, MD   40 mEq at 10/26/20 1058     Discharge Medications: Please see discharge summary for a list of discharge medications.  Relevant Imaging Results:  Relevant Lab Results:   Additional Information SSN [id number]  Ross Ludwig, LCSW

## 2020-10-26 NOTE — Progress Notes (Signed)
Physical Therapy Treatment Patient Details Name: [name] MRN: [id number] DOB: Apr 16, 1951 Today's Date: 10/26/2020    History of Present Illness Patient is a 70 y/o female admitted for Acute encephalopathy: possibly multifactorial, Most likely associated with narcotic use that she was taking for recent kyphoplasty.  Recent admission and found to have T10, T8 T12 and L1 compression fractures, s/p vertebroplasty 10/17/20.  PMH includes liver cirrhosis secondary to alcohol abuse, HTN.    PT Comments    Pt OOB "since 9:30" stated pt.  Feeling "tired" and having "some pain" in her back.  Attempted amb however unable to progress past 3 feet due toi elevated HR and MAX c/o weakness, fatigue and effort.  Assisted back to bed.  Instructed on "log roll" tech to minimize back pain.  Positioned to comfort.   Pt will need ST Rehab at SNF prior to returning home.   Follow Up Recommendations  SNF     Equipment Recommendations  None recommended by PT    Recommendations for Other Services       Precautions / Restrictions Precautions Precautions: Back Precaution Comments: reviewed back precautions recent Kyphplasty Restrictions Weight Bearing Restrictions: No    Mobility  Bed Mobility Overal bed mobility: Needs Assistance Bed Mobility: Sit to Sidelying   Sidelying to sit: Min assist       General bed mobility comments: Cues for log roll technique with use of rail for support, increased time, light assist for B LE onto bed.    Transfers Overall transfer level: Needs assistance Equipment used: Rolling walker (2 wheeled) Transfers: Sit to/from [facility] Sit to Stand: Mod assist         General transfer comment: 50% VC's on proper hand placement and increase time/effort to rise.  slow.  Ambulation/Gait Ambulation/Gait assistance: Min assist;+2 physical assistance;+2 safety/equipment Gait Distance (Feet): 3 Feet Assistive device: Rolling walker (2  wheeled) Gait Pattern/deviations: Step-through pattern;Decreased stride length;Decreased step length - right;Decreased step length - left Gait velocity: decreased   General Gait Details: limited distance due to fatigue/effort and HR ranged from 138 - 154   Stairs             Wheelchair Mobility    Modified [place] (Stroke Patients Only)       Balance                                            Cognition Arousal/Alertness: Awake/alert Behavior During Therapy: WFL for tasks assessed/performed Overall Cognitive Status: No family/caregiver present to determine baseline cognitive functioning Area of Impairment: Awareness;Problem solving                     Memory: Decreased short-term memory;Decreased recall of precautions         General Comments: AxO x 2 slightly sleepy/fatigued      Exercises      General Comments        Pertinent Vitals/Pain Pain Assessment: Faces Faces Pain Scale: Hurts a little bit Pain Location: left side - reports this to be normal due to her Pain Descriptors / Indicators: Sore;Aching;Grimacing Pain Intervention(s): Monitored during session;Repositioned    Home Living                      Prior Function            PT Goals (current  goals can now be found in the care plan section) Progress towards PT goals: Progressing toward goals    Frequency    Min 3X/week      PT Plan Current plan remains appropriate    Co-evaluation              AM-PAC PT "6 Clicks" Mobility   Outcome Measure  Help needed turning from your back to your side while in a flat bed without using bedrails?: A Little Help needed moving from lying on your back to sitting on the side of a flat bed without using bedrails?: A Little Help needed moving to and from a bed to a chair (including a wheelchair)?: A Lot Help needed standing up from a chair using your arms (e.g., wheelchair or bedside chair)?: A Lot Help  needed to walk in hospital room?: A Lot Help needed climbing 3-5 steps with a railing? : Total 6 Click Score: 13    End of Session Equipment Utilized During Treatment: Gait belt Activity Tolerance: Patient limited by fatigue Patient left: in bed;with call bell/phone within reach;with bed alarm set Nurse Communication: Mobility status PT Visit Diagnosis: Difficulty in walking, not elsewhere classified (R26.2);Muscle weakness (generalized) (M62.81)     Time: 0388-8280 PT Time Calculation (min) (ACUTE ONLY): 25 min  Charges:  $Gait Training: 8-22 mins $Therapeutic Activity: 8-22 mins                     Rica Koyanagi  PTA Acute  Rehabilitation Services Pager      [phone] Office      [phone]

## 2020-10-26 NOTE — Plan of Care (Signed)
  Problem: Health Behavior/Discharge Planning: Goal: Ability to manage health-related needs will improve Outcome: Progressing   Problem: Clinical Measurements: Goal: Ability to maintain clinical measurements within normal limits will improve Outcome: Progressing Goal: Diagnostic test results will improve Outcome: Progressing   Problem: Activity: Goal: Risk for activity intolerance will decrease Outcome: Progressing   Problem: Nutrition: Goal: Adequate nutrition will be maintained Outcome: Progressing   Problem: Education: Goal: Knowledge of General Education information will improve Description: Including pain rating scale, medication(s)/side effects and non-pharmacologic comfort measures Outcome: Progressing   Problem: Health Behavior/Discharge Planning: Goal: Ability to manage health-related needs will improve Outcome: Progressing   Problem: Clinical Measurements: Goal: Ability to maintain clinical measurements within normal limits will improve Outcome: Progressing Goal: Will remain free from infection Outcome: Progressing Goal: Diagnostic test results will improve Outcome: Progressing Goal: Respiratory complications will improve Outcome: Progressing Goal: Cardiovascular complication will be avoided Outcome: Progressing   Problem: Activity: Goal: Risk for activity intolerance will decrease Outcome: Progressing   Problem: Nutrition: Goal: Adequate nutrition will be maintained Outcome: Progressing   Problem: Coping: Goal: Level of anxiety will decrease Outcome: Progressing   Problem: Elimination: Goal: Will not experience complications related to bowel motility Outcome: Progressing Goal: Will not experience complications related to urinary retention Outcome: Progressing   Problem: Pain Managment: Goal: General experience of comfort will improve Outcome: Progressing   Problem: Safety: Goal: Ability to remain free from injury will improve Outcome:  Progressing   Problem: Skin Integrity: Goal: Risk for impaired skin integrity will decrease Outcome: Progressing

## 2020-10-26 NOTE — Care Management Important Message (Signed)
Important Message  Patient Details IM Letter placed in Patient's room for daughter [name]. Name: [name] MRN: [id number] Date of Birth: 07/31/1950   Medicare Important Message Given:  Yes     Kerin Salen 10/26/2020, 11:45 AM

## 2020-10-27 LAB — BASIC METABOLIC PANEL
Anion gap: 6 (ref 5–15)
BUN: 10 mg/dL (ref 8–23)
CO2: 27 mmol/L (ref 22–32)
Calcium: 9 mg/dL (ref 8.9–10.3)
Chloride: 101 mmol/L (ref 98–111)
Creatinine, Ser: 0.44 mg/dL (ref 0.44–1.00)
GFR, Estimated: >60 mL/min (ref >60)
Glucose, Bld: 97 mg/dL (ref 70–99)
Potassium: 4 mmol/L (ref 3.5–5.1)
Sodium: 134 mmol/L — ABNORMAL LOW (ref 135–145)

## 2020-10-27 LAB — RESP PANEL BY RT-PCR (FLU A&B, COVID) ARPGX2
Influenza A by PCR: NEGATIVE
Influenza B by PCR: NEGATIVE
SARS Coronavirus 2 by RT PCR: NEGATIVE

## 2020-10-27 LAB — MAGNESIUM: Magnesium: 1.6 mg/dL — ABNORMAL LOW (ref 1.7–2.4)

## 2020-10-27 MED ORDER — DICLOFENAC SODIUM 1 % EX GEL: 4.0000 g | Freq: Four times a day (QID) | CUTANEOUS | Status: DC

## 2020-10-27 MED ORDER — OXYCODONE HCL 5 MG PO TABS: 5.0000 mg | ORAL_TABLET | Freq: Four times a day (QID) | ORAL | 0 refills | Status: DC | PRN

## 2020-10-27 MED ORDER — IPRATROPIUM-ALBUTEROL 0.5-2.5 (3) MG/3ML IN SOLN: 3.0000 mL | Freq: Four times a day (QID) | RESPIRATORY_TRACT | Status: DC | PRN

## 2020-10-27 MED ORDER — MAGNESIUM SULFATE 2 GM/50ML IV SOLN
2.0000 g | Freq: Once | INTRAVENOUS | Status: AC
  Administered 2020-10-27: 2 g via INTRAVENOUS
  Filled 2020-10-27: qty 50

## 2020-10-27 MED ORDER — LIDOCAINE 5 % EX PTCH: 1.0000 | MEDICATED_PATCH | CUTANEOUS | 0 refills | Status: DC

## 2020-10-27 NOTE — TOC Progression Note (Signed)
Transition of Care [facility]) - Progression Note    Patient Details  Name: ANDEE CHIVERS MRN: [id number] Date of Birth: 11-19-1950  Transition of Care [facility] CM/SW Contact  Ross Ludwig, [place] Phone Number: 10/27/2020, 10:30 AM  Clinical Narrative:     CSW started insurance authorization, reference number is [id number].  CSW updated [facility] and requested a new Covid test.   Expected Discharge Plan: [place] Barriers to Discharge: Ship broker  Expected Discharge Plan and Services Expected Discharge Plan: [place] Choice: [place] arrangements for the past 2 months: Single Family Home                                       Social Determinants of Health (SDOH) Interventions    Readmission Risk Interventions No flowsheet data found.

## 2020-10-27 NOTE — Progress Notes (Signed)
PROGRESS NOTE    [name]  UKG:[id number] DOB: 1951/05/13 DOA: 10/22/2020 PCP: [name], MD   Chief Complain: Confusion  Brief Narrative: Patient is a 1 female with history of liver cirrhosis, recently diagnosed permanent A. fib, compression fracture of the thoracic spine status post kyphoplasty who was brought to the emergency department with complaints of confusion.  She was lethargic on presentation.  Her lab work showed severe hypokalemia.  CT head was unremarkable.  CT abdomen/pelvis showed consolidation in the right lower lung field concerning for pneumonia.  She was started on IV antibiotics for possible community-acquired pneumonia.  Currently mental status has improved.  PT/OT  Consulted and recommended SNF on DC.  Patient is medically stable for discharge to skilled facility as soon as bed is available.  Assessment & Plan:   Principal Problem:   Acute encephalopathy Active Problems:   HYPERTENSION, BENIGN ESSENTIAL   CIRRHOSIS, ALCOHOLIC, LIVER   Essential hypertension   Pneumonia   Acute encephalopathy: Currently alert and oriented but appears little foggy.  Encephalopathy could be multifactorial.  Most likely associated with narcotic use that she was taking for recent kyphoplasty.  NH3 levels were normal.  ABG did not show any hypercarbia. Suspicious for dementia.  As per the daughter, she is forgetful/confused at times, and this has been an ongoing problem.  Right lower lobe consolidation/suspected pneumonia: Currently respiratory status is stable.  Started on ceftriaxone azithromycin.  CT abdomen pelvis showed some consolidation of the right lower lung field, chest x-ray done did not show pneumonia.  Continue current antibiotics for now.  Aspiration was suspected, speech therapy consulted, underwent MBS without finding of any aspiration.  Procalcitonin reassuring.  Currently she is on room air. Follow-up chest x-ray on 5/22 showed bibasilar  atelectasis/infiltrate/pleural effusion.  Permanent A. fib: Continue Cardizem.  Not on anticoagulation because she was thought to be not a good candidate for that.  We recommend to follow-up with cardiology as an outpatient.  History of diastolic congestive heart failure: Currently euvolemic  Cirrhosis: Has history of liver cirrhosis with portal hypertension.  No finding of ascites as per CT abdomen/pelvis.  Ammonia level in the range of 30.  Needs to follow-up with GI as an outpatient.  Recent kyphoplasty for thoracic spine fracture: Taking opioids at home for pain relief.  PT/OT recommended SNF visit on discharge.Will recommend to minimize narcotics.  Continue Voltaren gel, lidocaine patch  Hypertension: Currently blood pressure stable.  Continue to monitor  Hypokalemia: Supplemented with potassium and corrected  Chronic hyponatremia: Stable/improved.  Urine sodium found to be less than 10.  Normal urine /plasma osmolality. Treated with gentle IV fluids,now stopped        DVT prophylaxis:Lovenox Code Status: DNR Family Communication: Called and discussed with daughter on phone on 10/25/20 Status is: Inpatient  Remains inpatient appropriate because:Inpatient level of care appropriate due to severity of illness   Dispo: The patient is from: Home              Anticipated d/c is to:SNF              Patient currently is medically stable for discharge   Difficult to place patient No    Consultants: None  Procedures:None  Antimicrobials:  Anti-infectives (From admission, onward)   Start     Dose/Rate Route Frequency Ordered Stop   10/26/20 1000  azithromycin (ZITHROMAX) tablet 500 mg        500 mg Oral Daily 10/25/20 0958 10/27/20 0907   10/23/20  0800  cefTRIAXone (ROCEPHIN) 2 g in sodium chloride 0.9 % 100 mL IVPB        2 g 200 mL/hr over 30 Minutes Intravenous Every 24 hours 10/23/20 0443 10/27/20 0845   10/23/20 0800  azithromycin (ZITHROMAX) 500 mg in sodium chloride 0.9  % 250 mL IVPB  Status:  Discontinued        500 mg 250 mL/hr over 60 Minutes Intravenous Every 24 hours 10/23/20 0443 10/25/20 0958   10/23/20 0130  piperacillin-tazobactam (ZOSYN) IVPB 3.375 g        3.375 g 100 mL/hr over 30 Minutes Intravenous  Once 10/23/20 0129 10/23/20 0237      Subjective:  Patient seen and examined at bedside this morning.  Hemodynamically stable.  On room air.  Looks comfortable.  Denies any shortness of breath  Objective: Vitals:   10/27/20 0745 10/27/20 0843 10/27/20 0845 10/27/20 1303  BP:    (!) 137/91  Pulse:    99  Resp:    (!) 23  Temp:    97.9 F (36.6 C)  TempSrc:    Oral  SpO2: 99% 99% 99% 99%  Weight:      Height:        Intake/Output Summary (Last 24 hours) at 10/27/2020 1312 Last data filed at 10/27/2020 1043 Gross per 24 hour  Intake 1060 ml  Output --  Net 1060 ml   Filed Weights   10/22/20 2149  Weight: 79 kg    Examination:  General exam: Overall comfortable, not in distress, chronically looking, weak HEENT: PERRL Respiratory system:  no wheezes or crackles, diminished air sound on the right side Cardiovascular system: S1 & S2 heard, RRR.  Gastrointestinal system: Abdomen is nondistended, soft and nontender. Central nervous system: Alert and oriented Extremities: No edema, no clubbing ,no cyanosis Skin: No rashes, no ulcers,no icterus   Data Reviewed: I have personally reviewed following labs and imaging studies  CBC: Recent Labs  Lab 10/22/20 2235 10/22/20 2244 10/23/20 0510 10/26/20 0317  WBC  --  10.4 9.8 8.2  NEUTROABS  --  8.6* 8.4* 6.6  HGB 12.6 12.6 12.4 11.3*  HCT 37.0 34.0* 33.8* 30.1*  MCV  --  88.5 89.7 88.5  PLT  --  216 196 062   Basic Metabolic Panel: Recent Labs  Lab 10/22/20 2244 10/23/20 0510 10/24/20 1033 10/25/20 0926 10/26/20 0317 10/27/20 0337  NA 130* 127* 131* 133* 131* 134*  K 2.8* 3.9 4.0 3.5 3.0* 4.0  CL 92* 93* 97* 97* 98 101  CO2 26 25 27 27 25 27   GLUCOSE 79 70 110* 89 91  97  BUN 14 13 15 13 11 10   CREATININE 0.59 0.46 0.50 0.48 0.44 0.44  CALCIUM 9.2 8.4* 8.5* 8.7* 8.6* 9.0  MG 1.7 1.8  --   --   --  1.6*   GFR: Estimated Creatinine Clearance: 65.6 mL/min (by C-G formula based on SCr of 0.44 mg/dL). Liver Function Tests: Recent Labs  Lab 10/22/20 2244 10/23/20 0510  AST 24 23  ALT 18 18  ALKPHOS 83 76  BILITOT 1.2 0.9  PROT 6.0* 5.8*  ALBUMIN 3.4* 3.3*   No results for input(s): LIPASE, AMYLASE in the last 168 hours. Recent Labs  Lab 10/22/20 2244 10/23/20 0510  AMMONIA 19 31   Coagulation Profile: No results for input(s): INR, PROTIME in the last 168 hours. Cardiac Enzymes: No results for input(s): CKTOTAL, CKMB, CKMBINDEX, TROPONINI in the last 168 hours. BNP (last 3 results)  No results for input(s): PROBNP in the last 8760 hours. HbA1C: No results for input(s): HGBA1C in the last 72 hours. CBG: Recent Labs  Lab 10/23/20 1412 10/23/20 1610 10/23/20 2337 10/24/20 0520 10/24/20 1301  GLUCAP 88 156* 134* 100* 125*   Lipid Profile: No results for input(s): CHOL, HDL, LDLCALC, TRIG, CHOLHDL, LDLDIRECT in the last 72 hours. Thyroid Function Tests: No results for input(s): TSH, T4TOTAL, FREET4, T3FREE, THYROIDAB in the last 72 hours. Anemia Panel: No results for input(s): VITAMINB12, FOLATE, FERRITIN, TIBC, IRON, RETICCTPCT in the last 72 hours. Sepsis Labs: Recent Labs  Lab 10/22/20 2213 10/23/20 1251  PROCALCITON  --  <0.10  LATICACIDVEN 0.7  --     Recent Results (from the past 240 hour(s))  Blood Cultures (routine x 2)     Status: None (Preliminary result)   Collection Time: 10/22/20 10:13 PM   Specimen: BLOOD  Result Value Ref Range Status   Specimen Description   Final    BLOOD RIGHT ANTECUBITAL Performed at [place] [facility] [address] [place], [place] [address]    Special Requests   Final    BOTTLES DRAWN AEROBIC AND ANAEROBIC Blood Culture adequate volume Performed at [facility] [address], [place], [place] [address]    Culture   Final    NO GROWTH 4 DAYS Performed at [facility], [place] [address] [place], [place] [address]    Report Status PENDING  Incomplete  Blood Cultures (routine x 2)     Status: None (Preliminary result)   Collection Time: 10/22/20 10:18 PM   Specimen: BLOOD  Result Value Ref Range Status   Specimen Description   Final    BLOOD LEFT ANTECUBITAL Performed at [facility] [address], [place], [place] [address]    Special Requests   Final    BOTTLES DRAWN AEROBIC AND ANAEROBIC Blood Culture adequate volume Performed at [facility] [address] [place], [place] [address]    Culture   Final    NO GROWTH 4 DAYS Performed at [facility], [place] [address], [place], [place] [address]    Report Status PENDING  Incomplete  Resp Panel by RT-PCR (Flu A&B, Covid) Nasopharyngeal Swab     Status: None   Collection Time: 10/23/20  1:22 AM   Specimen: Nasopharyngeal Swab; Nasopharyngeal(NP) swabs in vial transport medium  Result Value Ref Range Status   SARS Coronavirus 2 by RT PCR NEGATIVE NEGATIVE Final    Comment: (NOTE) SARS-CoV-2 target nucleic acids are NOT DETECTED.  The SARS-CoV-2 RNA is generally detectable in upper respiratory specimens during the acute phase of infection. The lowest concentration of SARS-CoV-2 viral copies this assay can detect is 138 copies/mL. A negative result does not preclude SARS-Cov-2 infection and should not be used as the sole basis for treatment or other patient management decisions. A negative result may occur with  improper specimen collection/handling, submission of specimen other than nasopharyngeal swab, presence of viral mutation(s) within the areas targeted by this assay, and inadequate number of viral copies(<138 copies/mL). A negative result must be combined with clinical observations, patient history, and  epidemiological information. The expected result is Negative.  Fact Sheet for Patients:  EntrepreneurPulse.com.au  Fact Sheet for Healthcare Providers:  IncredibleEmployment.be  This test is no t yet approved or cleared by the [place] FDA and  has been authorized for detection and/or diagnosis of SARS-CoV-2 by FDA under an Emergency Use  Authorization (EUA). This EUA will remain  in effect (meaning this test can be used) for the duration of the COVID-19 declaration under Section 564(b)(1) of the Act, 21 U.S.C.section 360bbb-3(b)(1), unless the authorization is terminated  or revoked sooner.       Influenza A by PCR NEGATIVE NEGATIVE Final   Influenza B by PCR NEGATIVE NEGATIVE Final    Comment: (NOTE) The Xpert Xpress SARS-CoV-2/FLU/RSV plus assay is intended as an aid in the diagnosis of influenza from Nasopharyngeal swab specimens and should not be used as a sole basis for treatment. Nasal washings and aspirates are unacceptable for Xpert Xpress SARS-CoV-2/FLU/RSV testing.  Fact Sheet for Patients: EntrepreneurPulse.com.au  Fact Sheet for Healthcare Providers: IncredibleEmployment.be  This test is not yet approved or cleared by the [place] FDA and has been authorized for detection and/or diagnosis of SARS-CoV-2 by FDA under an Emergency Use Authorization (EUA). This EUA will remain in effect (meaning this test can be used) for the duration of the COVID-19 declaration under Section 564(b)(1) of the Act, 21 U.S.C. section 360bbb-3(b)(1), unless the authorization is terminated or revoked.  Performed at [facility], [place] [address] [place], [place] [address]   Resp Panel by RT-PCR (Flu A&B, Covid) Nasopharyngeal Swab     Status: None   Collection Time: 10/27/20 10:15 AM   Specimen: Nasopharyngeal Swab; Nasopharyngeal(NP) swabs in vial transport medium  Result Value Ref  Range Status   SARS Coronavirus 2 by RT PCR NEGATIVE NEGATIVE Final    Comment: (NOTE) SARS-CoV-2 target nucleic acids are NOT DETECTED.  The SARS-CoV-2 RNA is generally detectable in upper respiratory specimens during the acute phase of infection. The lowest concentration of SARS-CoV-2 viral copies this assay can detect is 138 copies/mL. A negative result does not preclude SARS-Cov-2 infection and should not be used as the sole basis for treatment or other patient management decisions. A negative result may occur with  improper specimen collection/handling, submission of specimen other than nasopharyngeal swab, presence of viral mutation(s) within the areas targeted by this assay, and inadequate number of viral copies(<138 copies/mL). A negative result must be combined with clinical observations, patient history, and epidemiological information. The expected result is Negative.  Fact Sheet for Patients:  EntrepreneurPulse.com.au  Fact Sheet for Healthcare Providers:  IncredibleEmployment.be  This test is no t yet approved or cleared by the [place] FDA and  has been authorized for detection and/or diagnosis of SARS-CoV-2 by FDA under an Emergency Use Authorization (EUA). This EUA will remain  in effect (meaning this test can be used) for the duration of the COVID-19 declaration under Section 564(b)(1) of the Act, 21 U.S.C.section 360bbb-3(b)(1), unless the authorization is terminated  or revoked sooner.       Influenza A by PCR NEGATIVE NEGATIVE Final   Influenza B by PCR NEGATIVE NEGATIVE Final    Comment: (NOTE) The Xpert Xpress SARS-CoV-2/FLU/RSV plus assay is intended as an aid in the diagnosis of influenza from Nasopharyngeal swab specimens and should not be used as a sole basis for treatment. Nasal washings and aspirates are unacceptable for Xpert Xpress SARS-CoV-2/FLU/RSV testing.  Fact Sheet for  Patients: EntrepreneurPulse.com.au  Fact Sheet for Healthcare Providers: IncredibleEmployment.be  This test is not yet approved or cleared by the [place] FDA and has been authorized for detection and/or diagnosis of SARS-CoV-2 by FDA under an Emergency Use Authorization (EUA). This EUA will remain in effect (meaning this test can be used) for the duration of the COVID-19 declaration under  Section 564(b)(1) of the Act, 21 U.S.C. section 360bbb-3(b)(1), unless the authorization is terminated or revoked.  Performed at [facility], [place] [address], [place], [place] [address]          Radiology Studies: No results found.      Scheduled Meds: . budesonide (PULMICORT) nebulizer solution  0.25 mg Nebulization BID  . diclofenac Sodium  4 g Topical QID  . diltiazem  120 mg Oral Daily  . enoxaparin (LOVENOX) injection  40 mg Subcutaneous Q24H  . hydrALAZINE  75 mg Oral TID  . ipratropium-albuterol  3 mL Nebulization TID  . levothyroxine  100 mcg Oral Q0600  . lidocaine  1 patch Transdermal Q24H  . pantoprazole  40 mg Oral Daily   Continuous Infusions:    LOS: 4 days    Time spent: 15 mins.More than 50% of that time was spent in counseling and/or coordination of care.      Shelly Coss, MD [facility] P5/10/2020, 1:12 PM

## 2020-10-27 NOTE — Discharge Summary (Signed)
Physician Discharge Summary  [name] [id number] DOB: August 21, 1950 DOA: 10/22/2020  PCP: [name], MD  Admit date: 10/22/2020 Discharge date: 10/27/2020  Admitted From: Home Disposition:  Home  Discharge Condition:Stable CODE STATUS:DNR, Comfort Care Diet recommendation: Heart Healthy  Brief/Interim Summary:  Patient is a 3 female with history of liver cirrhosis, recently diagnosed permanent A. fib, compression fracture of the thoracic spine status post kyphoplasty who was brought to the emergency department with complaints of confusion.  She was lethargic on presentation.  Her lab work showed severe hypokalemia.  CT head was unremarkable.  CT abdomen/pelvis showed consolidation in the right lower lung field concerning for pneumonia.  She was started on IV antibiotics for possible community-acquired pneumonia.  Respiratory status improved, currently on room air. Mental status has improved.  PT/OT  Consulted and recommended SNF on DC.  Patient is medically stable for discharge to skilled nursing facility.  Following problems were addressed during her hospitalization:    Acute encephalopathy: Currently alert and oriented   Encephalopathy could be multifactorial.  Most likely associated with narcotic use that she was taking for recent kyphoplasty.  NH3 levels were normal.  ABG did not show any hypercarbia. Suspicious for dementia.  As per the daughter, she is forgetful/confused at times, and this has been an ongoing problem.  Right lower lobe consolidation/suspected pneumonia: Currently respiratory status is stable.  Started on ceftriaxone azithromycin.  CT abdomen pelvis showed some consolidation of the right lower lung field, chest x-ray done did not show pneumonia.  .  Aspiration was suspected, speech therapy consulted, underwent MBS without finding of any aspiration.  Procalcitonin reassuring.  Currently she is on room air.Completed the abx course. Follow-up chest  x-ray on 5/22 showed bibasilar atelectasis/infiltrate/pleural effusion.  Permanent A. fib: Continue Cardizem.  Not on anticoagulation because she was thought to be not a good candidate for that.  We recommend to follow-up with cardiology as an outpatient.  History of diastolic congestive heart failure: Currently euvolemic  Cirrhosis: Has history of liver cirrhosis with portal hypertension.  No finding of ascites as per CT abdomen/pelvis.  Ammonia level in the range of 30.  Needs to follow-up with GI as an outpatient.  Recent kyphoplasty for thoracic spine fracture: Taking opioids at home for pain relief.  PT/OT recommended SNF on discharge.Will recommend to minimize narcotics.  Continue Voltaren gel, lidocaine patch  Hypertension: Currently blood pressure stable.  Continue to monitor  Hypokalemia/hypomagnesemia: Supplemented   Chronic hyponatremia: Stable/improved.    Discharge Diagnoses:  Principal Problem:   Acute encephalopathy Active Problems:   HYPERTENSION, BENIGN ESSENTIAL   CIRRHOSIS, ALCOHOLIC, LIVER   Essential hypertension   Pneumonia    Discharge Instructions  Discharge Instructions    Diet - low sodium heart healthy   Complete by: As directed    Discharge instructions   Complete by: As directed    1)Please take the medications as instructed 2)Do CBC and BMP tests in a week   Increase activity slowly   Complete by: As directed      Allergies as of 10/27/2020   No Known Allergies     Medication List    STOP taking these medications   lisinopril 40 MG tablet Commonly known as: ZESTRIL     TAKE these medications   acetaminophen 500 MG tablet Commonly known as: TYLENOL Take 1 tablet (500 mg total) by mouth every 6 (six) hours as needed for mild pain, fever or headache.   B COMPLEX 1 PO Take 1  tablet by mouth daily.   BILBERRY FRUIT PO Take 1 tablet by mouth daily.   calcium carbonate 1500 (600 Ca) MG Tabs tablet Commonly known as:  OSCAL Take 600 mg of elemental calcium by mouth daily with breakfast.   citalopram 20 MG tablet Commonly known as: CELEXA TAKE 1 TABLET BY MOUTH EVERY DAY.   diclofenac Sodium 1 % Gel Commonly known as: VOLTAREN Apply 2 g topically 4 (four) times daily as needed (pain). What changed: Another medication with the same name was added. Make sure you understand how and when to take each.   diclofenac Sodium 1 % Gel Commonly known as: VOLTAREN Apply 4 g topically 4 (four) times daily. What changed: You were already taking a medication with the same name, and this prescription was added. Make sure you understand how and when to take each.   diltiazem 120 MG 24 hr capsule Commonly known as: CARDIZEM CD Take 1 capsule (120 mg total) by mouth daily.   FLAX SEED OIL PO Take 1 tablet by mouth daily.   folic acid 505 MCG tablet Commonly known as: FOLVITE Take 400 mcg by mouth daily.   hydrALAZINE 25 MG tablet Commonly known as: APRESOLINE Take 3 tablets (75 mg total) by mouth 3 (three) times daily.   ipratropium-albuterol 0.5-2.5 (3) MG/3ML Soln Commonly known as: DUONEB Take 3 mLs by nebulization every 6 (six) hours as needed.   levothyroxine 100 MCG tablet Commonly known as: SYNTHROID Take 1 tablet (100 mcg total) by mouth daily at 6 (six) AM. Please follow up repeat labs in 4-6 weeks with PCP.   lidocaine 5 % Commonly known as: LIDODERM Place 1 patch onto the skin daily. Remove & Discard patch within 12 hours or as directed by MD   LUTEIN PO Take 1 tablet by mouth daily.   multivitamin with minerals Tabs tablet Take 1 tablet by mouth daily.   multivitamin-lutein Caps capsule Take 1 capsule by mouth daily.   omega-3 acid ethyl esters 1 g capsule Commonly known as: LOVAZA Take 1 capsule (1 g total) by mouth daily.   oxyCODONE 5 MG immediate release tablet Commonly known as: Oxy IR/ROXICODONE Take 1 tablet (5 mg total) by mouth every 6 (six) hours as needed for severe  pain.   pantoprazole 40 MG tablet Commonly known as: PROTONIX Take 1 tablet (40 mg total) by mouth at bedtime.   vitamin C 100 MG tablet Take 100 mg by mouth daily.   VITAMIN E PO Take 1 tablet by mouth 3 (three) times a week.       No Known Allergies  Consultations:  None   Procedures/Studies: CT ANGIO HEAD W OR WO CONTRAST  Result Date: 10/12/2020 CLINICAL DATA:  Delirium.  Cerebral aneurysm. EXAM: CT ANGIOGRAPHY HEAD AND NECK TECHNIQUE: Multidetector CT imaging of the head and neck was performed using the standard protocol during bolus administration of intravenous contrast. Multiplanar CT image reconstructions and MIPs were obtained to evaluate the vascular anatomy. Carotid stenosis measurements (when applicable) are obtained utilizing NASCET criteria, using the distal internal carotid diameter as the denominator. CONTRAST:  56mL OMNIPAQUE IOHEXOL 350 MG/ML SOLN COMPARISON:  CT head 10/11/2020 FINDINGS: CTA NECK FINDINGS Aortic arch: Standard branching. Imaged portion shows no evidence of aneurysm or dissection. No significant stenosis of the major arch vessel origins. Mild atherosclerotic disease aortic arch and proximal great vessels. Right carotid system: Mild atherosclerotic disease right carotid bifurcation. Negative for stenosis. Left carotid system: Mild atherosclerotic disease left carotid bifurcation. Negative  for stenosis. Vertebral arteries: Both vertebral arteries patent to the basilar. Mild calcific stenosis distal left vertebral artery at the skull base. Skeleton: Mild degenerative change cervical spine. No acute skeletal abnormality. Other neck: Negative Upper chest: Lung apices clear bilaterally. Review of the MIP images confirms the above findings CTA HEAD FINDINGS Anterior circulation: Cavernous carotid widely patent bilaterally. Anterior middle cerebral arteries normal bilaterally. No stenosis or aneurysm in the anterior circulation. Posterior circulation: Both  vertebral arteries patent to the basilar. Mild stenosis distal left vertebral artery at the skull base. Right PICA patent. Left PICA not visualized. Basilar widely patent. AICA, superior cerebellar, and posterior cerebral arteries widely patent without stenosis. 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 Mildly lobulated or margins. Aneurysm measures approximately 8 x 9 mm in diameter. No evidence of rupture or thrombus. Venous sinuses: Normal venous enhancement Anatomic variants: None Review of the MIP images confirms the above findings IMPRESSION: 1. 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 No other aneurysm 2. No intracranial stenosis or large vessel occlusion 3. No significant carotid or vertebral artery stenosis. Electronically Signed   By: Franchot Gallo M.D.   On: 10/12/2020 11:38   CT HEAD WO CONTRAST  Result Date: 10/22/2020 CLINICAL DATA:  Lethargic. EXAM: CT HEAD WITHOUT CONTRAST TECHNIQUE: Contiguous axial images were obtained from the base of the skull through the vertex without intravenous contrast. COMPARISON:  October 11, 2020 FINDINGS: Brain: There is mild cerebral atrophy with widening of the extra-axial spaces and ventricular dilatation. There are areas of decreased attenuation within the white matter tracts of the supratentorial brain, consistent with microvascular disease changes. Vascular: Approximately 7.0 mm x 7.6 mm stable aneurysmal dilatation of the basilar artery is noted. Skull: Normal. Negative for fracture or focal lesion. Sinuses/Orbits: No acute finding. Other: None. IMPRESSION: 1. Generalized cerebral atrophy. 2. No acute intracranial abnormality. 3. Stable aneurysmal dilatation of the basilar artery. Electronically Signed   By: Virgina Norfolk M.D.   On: 10/22/2020 23:46   CT HEAD WO CONTRAST  Result Date: 10/12/2020 CLINICAL DATA:  Delirium EXAM: CT HEAD WITHOUT CONTRAST TECHNIQUE: Contiguous axial images were obtained from the  base of the skull through the vertex without intravenous contrast. COMPARISON:  None. FINDINGS: Brain: Cerebral ventricle sizes are concordant with the degree of cerebral volume loss. Patchy and confluent areas of decreased attenuation are noted throughout the deep and periventricular white matter of the cerebral hemispheres bilaterally, compatible with chronic microvascular ischemic disease. No evidence of large-territorial acute infarction. No parenchymal hemorrhage. No mass lesion. No extra-axial collection. No mass effect or midline shift. No hydrocephalus. Basilar cisterns are patent. Vascular: Aneurysmal dilatation of the basilar artery (6:26, 3:11). Skull: Heterogeneous appearance of the osseous structures. No acute fracture or focal lesion. Sinuses/Orbits: Paranasal sinuses and mastoid air cells are clear. The orbits are unremarkable. Other: None. IMPRESSION: 1. Aneurysmal dilatation of the basilar artery. Recommend CT angioagraphy for further evaluation. 2. Nonspecific heterogeneous appearance of the osseous structures. 3. No acute intracranial abnormality. These results will be called to the ordering clinician or representative by the Radiologist Assistant, and communication documented in the PACS or [employer]. Electronically Signed   By: Iven Finn M.D.   On: 10/12/2020 00:00   CT ANGIO NECK W OR WO CONTRAST  Result Date: 10/12/2020 CLINICAL DATA:  Delirium.  Cerebral aneurysm. EXAM: CT ANGIOGRAPHY HEAD AND NECK TECHNIQUE: Multidetector CT imaging of the head and neck was performed using the standard protocol during bolus  administration of intravenous contrast. Multiplanar CT image reconstructions and MIPs were obtained to evaluate the vascular anatomy. Carotid stenosis measurements (when applicable) are obtained utilizing NASCET criteria, using the distal internal carotid diameter as the denominator. CONTRAST:  64mL OMNIPAQUE IOHEXOL 350 MG/ML SOLN COMPARISON:  CT head 10/11/2020  FINDINGS: CTA NECK FINDINGS Aortic arch: Standard branching. Imaged portion shows no evidence of aneurysm or dissection. No significant stenosis of the major arch vessel origins. Mild atherosclerotic disease aortic arch and proximal great vessels. Right carotid system: Mild atherosclerotic disease right carotid bifurcation. Negative for stenosis. Left carotid system: Mild atherosclerotic disease left carotid bifurcation. Negative for stenosis. Vertebral arteries: Both vertebral arteries patent to the basilar. Mild calcific stenosis distal left vertebral artery at the skull base. Skeleton: Mild degenerative change cervical spine. No acute skeletal abnormality. Other neck: Negative Upper chest: Lung apices clear bilaterally. Review of the MIP images confirms the above findings CTA HEAD FINDINGS Anterior circulation: Cavernous carotid widely patent bilaterally. Anterior middle cerebral arteries normal bilaterally. No stenosis or aneurysm in the anterior circulation. Posterior circulation: Both vertebral arteries patent to the basilar. Mild stenosis distal left vertebral artery at the skull base. Right PICA patent. Left PICA not visualized. Basilar widely patent. AICA, superior cerebellar, and posterior cerebral arteries widely patent without stenosis. 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 Mildly lobulated or margins. Aneurysm measures approximately 8 x 9 mm in diameter. No evidence of rupture or thrombus. Venous sinuses: Normal venous enhancement Anatomic variants: None Review of the MIP images confirms the above findings IMPRESSION: 1. 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 No other aneurysm 2. No intracranial stenosis or large vessel occlusion 3. No significant carotid or vertebral artery stenosis. Electronically Signed   By: Franchot Gallo M.D.   On: 10/12/2020 11:38   CT ANGIO CHEST PE W OR WO CONTRAST  Result Date: 10/13/2020 CLINICAL DATA:  Positive D-dimer.   Shortness of breath EXAM: CT ANGIOGRAPHY CHEST WITH CONTRAST TECHNIQUE: Multidetector CT imaging of the chest was performed using the standard protocol during bolus administration of intravenous contrast. Multiplanar CT image reconstructions and MIPs were obtained to evaluate the vascular anatomy. CONTRAST:  57mL OMNIPAQUE IOHEXOL 350 MG/ML SOLN COMPARISON:  None. FINDINGS: Cardiovascular: No filling defects in the pulmonary arteries to suggest pulmonary emboli. Heart is mildly enlarged. Aorta normal caliber. Scattered aortic calcifications. Mediastinum/Nodes: No mediastinal, hilar, or axillary adenopathy. Trachea and esophagus are unremarkable. Thyroid unremarkable. Lungs/Pleura: Small right pleural effusion. Atelectasis or consolidation in the right lower lobe. No confluent opacity on the left. Upper Abdomen: Nodular contours of the liver suggest cirrhosis. Musculoskeletal: Chest wall soft tissues are unremarkable. Multiple compression fractures in the mid to lower thoracic spine and upper lumbar spine. Review of the MIP images confirms the above findings. IMPRESSION: Cardiomegaly. No evidence of pulmonary embolus. Small right pleural effusion with right lower lobe atelectasis or infiltrate. Multiple compression fractures in the mid to lower thoracic spine and upper lumbar spine. Suspect changes of cirrhosis. Aortic Atherosclerosis (ICD10-I70.0). Electronically Signed   By: Rolm Baptise M.D.   On: 10/13/2020 17:07   MR CERVICAL SPINE WO CONTRAST  Result Date: 10/13/2020 CLINICAL DATA:  Neck and back pain after fall.  Syrinx. EXAM: MRI CERVICAL SPINE WITHOUT CONTRAST TECHNIQUE: Multiplanar, multisequence MR imaging of the cervical spine was performed. No intravenous contrast was administered. COMPARISON:  Thoracic MRI 10/09/2020 FINDINGS: Alignment: Slight C6-7 anterolisthesis. Vertebrae: No fracture, evidence of discitis, or bone lesion. Cord: Hydromyelia seen from  C4-5 into the thoracic spine, measuring up to  2-3 mm in the thoracic spine. No underlying cord swelling, mass, or impingement. Posterior Fossa, vertebral arteries, paraspinal tissues: Mild T2 hyperintensity in the pons attributed to chronic small vessel ischemia. Disc levels: Disc narrowing and bulging especially at C3-4 to C5-6. Mild facet spurring and ligamentum flavum buckling at the same levels. Diffusely patent spinal canal. 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 No underlying cord lesion, compression, or edema. Ordinary cervical spine degeneration. Electronically Signed   By: Monte Fantasia M.D.   On: 10/13/2020 09:19   MR THORACIC SPINE WO CONTRAST  Result Date: 10/09/2020 CLINICAL DATA:  Initial evaluation for acute spine fracture. EXAM: MRI THORACIC AND LUMBAR SPINE WITHOUT CONTRAST TECHNIQUE: Multiplanar and multiecho pulse sequences of the thoracic and lumbar spine were obtained without intravenous contrast. COMPARISON:  Prior CT from 10/08/2020. FINDINGS: MRI THORACIC SPINE FINDINGS Alignment: Trace sigmoid scoliosis. Alignment otherwise normal with preservation of the normal thoracic kyphosis. No listhesis. Vertebrae: Acute biconcave compression fracture seen involving the T9 vertebral body. Associated mild 25% central height loss with trace 3 mm bony retropulsion. Additional acute appearing compression fracture involving the T10 vertebral body with up to 60% height loss and 4 mm bony retropulsion. Associated marrow edema about these fractures extend to involve the posterior elements. Severe compression deformities involving the T12 and L1 vertebral bodies with up to 80-90% height loss and 4 mm bony retropulsion are late subacute to chronic in appearance with only trace residual marrow edema. Additional mild chronic compression fracture involving the T6 vertebral body with up to 30% height loss without bony retropulsion. Vertebral body height otherwise maintained with no other acute or chronic fracture.  Underlying bone marrow signal intensity is markedly heterogeneous, nonspecific, but could reflect underlying osteoporosis. No discrete or worrisome osseous lesions. No other abnormal marrow edema. Cord: Small syrinx involving the thoracic spinal cord measures up to 3 mm in maximal diameter, extending from the visualized upper cord to approximately the T8-9 level. Signal intensity within the cord is otherwise normal. Underlying normal cord caliber and morphology. Paraspinal and other soft tissues: Mild paraspinous edema adjacent to the acute T9 and T10 compression fractures. Paraspinous soft tissues demonstrate no other acute finding. Moderate layering right pleural effusion. Visualized visceral structures otherwise unremarkable. Disc levels: Normal expected for age multilevel disc desiccation seen throughout the thoracic spine. No significant disc bulge or focal disc herniation. Three-4 mm bony retropulsion at the levels of T10, T12, and L1 related to the underlying compression fractures without significant spinal stenosis. No other significant spinal stenosis within the thoracic spine. Foramina remain patent. MRI LUMBAR SPINE FINDINGS Segmentation: Standard. Lowest well-formed disc space labeled the L5-S1 level. Alignment: Mild exaggeration of the normal lumbar lordosis. No listhesis. Vertebrae: Late subacute to chronic compression fractures involving the T12 and L1 vertebral bodies again noted. Vertebral body height otherwise maintained with no other acute, subacute, or chronic compression fracture. Underlying bone marrow signal intensity is markedly heterogeneous without discrete or worrisome osseous lesion. No other abnormal marrow edema. Conus medullaris and cauda equina: Conus extends to the T12-L1 level. Conus and cauda equina appear normal. Paraspinal and other soft tissues: Chronic fatty atrophy noted throughout the posterior paraspinous musculature. Paraspinous soft tissues demonstrate no acute finding.  1.8 cm simple cyst noted within the interpolar left kidney. Additional 9 mm T1 hyperintense lesion at the inferior pole the left kidney noted, nonspecific, but likely a small proteinaceous and/or hemorrhagic cyst. Hepatic cirrhosis partially  visualized. Disc levels: L1-2: Minimal annular disc bulge. Mild facet hypertrophy. No spinal stenosis. Foramina remain patent. L2-3: Disc desiccation with minimal annular disc bulge. Mild facet hypertrophy. No stenosis. L3-4: Minimal annular disc bulge. Mild facet hypertrophy. No canal or foraminal stenosis. L4-5: Disc desiccation with minimal annular disc bulge. Mild facet hypertrophy. No stenosis. L5-S1: Negative interspace. Mild facet hypertrophy. No canal or foraminal stenosis. IMPRESSION: 1. Acute compression fracture involving the T10 vertebral body with up to 60% height loss and 4 mm bony retropulsion. No significant spinal stenosis. 2. Acute compression fracture involving the T9 vertebral body with up to 25% height loss without significant bony retropulsion. 3. Late subacute to chronic severe compression fractures involving the T12 and L1 vertebral bodies with 80-90% height loss and up to 4 mm bony retropulsion. No significant spinal stenosis. 4. Markedly heterogeneous appearance of the visualized bone marrow, nonspecific, but could reflect underlying osteoporosis. 5. Small syrinx measuring up to 3 mm involving the majority of the thoracic spinal cord, terminating at the level of T8-9. 6. Moderate layering right pleural effusion. 7. Hepatic cirrhosis. Electronically Signed   By: Jeannine Boga M.D.   On: 10/09/2020 20:23   MR LUMBAR SPINE WO CONTRAST  Result Date: 10/09/2020 CLINICAL DATA:  Initial evaluation for acute spine fracture. EXAM: MRI THORACIC AND LUMBAR SPINE WITHOUT CONTRAST TECHNIQUE: Multiplanar and multiecho pulse sequences of the thoracic and lumbar spine were obtained without intravenous contrast. COMPARISON:  Prior CT from 10/08/2020.  FINDINGS: MRI THORACIC SPINE FINDINGS Alignment: Trace sigmoid scoliosis. Alignment otherwise normal with preservation of the normal thoracic kyphosis. No listhesis. Vertebrae: Acute biconcave compression fracture seen involving the T9 vertebral body. Associated mild 25% central height loss with trace 3 mm bony retropulsion. Additional acute appearing compression fracture involving the T10 vertebral body with up to 60% height loss and 4 mm bony retropulsion. Associated marrow edema about these fractures extend to involve the posterior elements. Severe compression deformities involving the T12 and L1 vertebral bodies with up to 80-90% height loss and 4 mm bony retropulsion are late subacute to chronic in appearance with only trace residual marrow edema. Additional mild chronic compression fracture involving the T6 vertebral body with up to 30% height loss without bony retropulsion. Vertebral body height otherwise maintained with no other acute or chronic fracture. Underlying bone marrow signal intensity is markedly heterogeneous, nonspecific, but could reflect underlying osteoporosis. No discrete or worrisome osseous lesions. No other abnormal marrow edema. Cord: Small syrinx involving the thoracic spinal cord measures up to 3 mm in maximal diameter, extending from the visualized upper cord to approximately the T8-9 level. Signal intensity within the cord is otherwise normal. Underlying normal cord caliber and morphology. Paraspinal and other soft tissues: Mild paraspinous edema adjacent to the acute T9 and T10 compression fractures. Paraspinous soft tissues demonstrate no other acute finding. Moderate layering right pleural effusion. Visualized visceral structures otherwise unremarkable. Disc levels: Normal expected for age multilevel disc desiccation seen throughout the thoracic spine. No significant disc bulge or focal disc herniation. Three-4 mm bony retropulsion at the levels of T10, T12, and L1 related to the  underlying compression fractures without significant spinal stenosis. No other significant spinal stenosis within the thoracic spine. Foramina remain patent. MRI LUMBAR SPINE FINDINGS Segmentation: Standard. Lowest well-formed disc space labeled the L5-S1 level. Alignment: Mild exaggeration of the normal lumbar lordosis. No listhesis. Vertebrae: Late subacute to chronic compression fractures involving the T12 and L1 vertebral bodies again noted. Vertebral body height otherwise maintained with no  other acute, subacute, or chronic compression fracture. Underlying bone marrow signal intensity is markedly heterogeneous without discrete or worrisome osseous lesion. No other abnormal marrow edema. Conus medullaris and cauda equina: Conus extends to the T12-L1 level. Conus and cauda equina appear normal. Paraspinal and other soft tissues: Chronic fatty atrophy noted throughout the posterior paraspinous musculature. Paraspinous soft tissues demonstrate no acute finding. 1.8 cm simple cyst noted within the interpolar left kidney. Additional 9 mm T1 hyperintense lesion at the inferior pole the left kidney noted, nonspecific, but likely a small proteinaceous and/or hemorrhagic cyst. Hepatic cirrhosis partially visualized. Disc levels: L1-2: Minimal annular disc bulge. Mild facet hypertrophy. No spinal stenosis. Foramina remain patent. L2-3: Disc desiccation with minimal annular disc bulge. Mild facet hypertrophy. No stenosis. L3-4: Minimal annular disc bulge. Mild facet hypertrophy. No canal or foraminal stenosis. L4-5: Disc desiccation with minimal annular disc bulge. Mild facet hypertrophy. No stenosis. L5-S1: Negative interspace. Mild facet hypertrophy. No canal or foraminal stenosis. IMPRESSION: 1. Acute compression fracture involving the T10 vertebral body with up to 60% height loss and 4 mm bony retropulsion. No significant spinal stenosis. 2. Acute compression fracture involving the T9 vertebral body with up to 25%  height loss without significant bony retropulsion. 3. Late subacute to chronic severe compression fractures involving the T12 and L1 vertebral bodies with 80-90% height loss and up to 4 mm bony retropulsion. No significant spinal stenosis. 4. Markedly heterogeneous appearance of the visualized bone marrow, nonspecific, but could reflect underlying osteoporosis. 5. Small syrinx measuring up to 3 mm involving the majority of the thoracic spinal cord, terminating at the level of T8-9. 6. Moderate layering right pleural effusion. 7. Hepatic cirrhosis. Electronically Signed   By: Jeannine Boga M.D.   On: 10/09/2020 20:23   CT ABDOMEN PELVIS W CONTRAST  Result Date: 10/22/2020 CLINICAL DATA:  Lethargy. EXAM: CT ABDOMEN AND PELVIS WITH CONTRAST TECHNIQUE: Multidetector CT imaging of the abdomen and pelvis was performed using the standard protocol following bolus administration of intravenous contrast. CONTRAST:  134mL OMNIPAQUE IOHEXOL 300 MG/ML  SOLN COMPARISON:  February 07, 2021 FINDINGS: Lower chest: A large area of consolidation is seen within the right lung base. This is increased in size when compared to the prior study. A small right pleural effusion is also seen. This is also increased in size when compared to the prior exam. Hepatobiliary: The liver is cirrhotic in appearance with diffuse fatty infiltration of the liver parenchyma. No focal liver abnormality is seen. Status post cholecystectomy. No biliary dilatation. Pancreas: Unremarkable. No pancreatic ductal dilatation or surrounding inflammatory changes. Spleen: No splenic injury or perisplenic hematoma. Adrenals/Urinary Tract: Adrenal glands are unremarkable. Kidneys are normal in size, without obstructing renal calculi or hydronephrosis. Stable simple cysts are seen within the left kidney. A 4 mm nonobstructing renal stone is seen within the lower pole of the right kidney. Bladder is unremarkable. Stomach/Bowel: Stomach is within normal limits.  Appendix appears normal. No evidence of bowel wall thickening, distention, or inflammatory changes. Vascular/Lymphatic: Aortic atherosclerosis. Recanalization of the paraumbilical vein is noted with multiple venous collaterals seen. No enlarged abdominal or pelvic lymph nodes. Reproductive: Uterus and bilateral adnexa are unremarkable. Other: No abdominal wall hernia or abnormality. No abdominopelvic ascites. Musculoskeletal: A chronic anterolateral sixth left rib deformity is seen. Additional chronic fracture deformity is seen involving the right inferior pubic ramus. Compression fracture deformities are seen at the levels of T9, T10, T12 and L1, with prior vertebroplasty at the levels of T9 and T10. IMPRESSION:  1. Marked severity right lower lobe consolidation, increased in severity when compared to the prior study. 2. Small right pleural effusion. 3. Cirrhosis and portal hypertension. 4. Prior cholecystectomy. 5. Multiple compression fractures within the thoracic and vertebral spine, as described above. Electronically Signed   By: Virgina Norfolk M.D.   On: 10/22/2020 23:56   CT Abdomen Pelvis W Contrast  Result Date: 10/08/2020 CLINICAL DATA:  Nonlocalized acute abdominal pain. Left-sided abdominal pain for 2 months. EXAM: CT ABDOMEN AND PELVIS WITH CONTRAST TECHNIQUE: Multidetector CT imaging of the abdomen and pelvis was performed using the standard protocol following bolus administration of intravenous contrast. CONTRAST:  163mL OMNIPAQUE IOHEXOL 300 MG/ML  SOLN COMPARISON:  CT abdomen pelvis 12/05/2015 FINDINGS: Lower chest: Trace right pleural effusion with associated right lower lobe passive atelectasis. Mitral annular calcifications. Possible tiny hiatal hernia. Hepatobiliary: Nodular hepatic contour. The hepatic parenchyma is diffusely hypodense compared to the splenic parenchyma consistent with fatty infiltration. No focal liver abnormality. Status post cholecystectomy. No biliary dilatation.  Pancreas: No focal lesion. Normal pancreatic contour. No surrounding inflammatory changes. No main pancreatic ductal dilatation. Spleen: Normal in size without focal abnormality. Adrenals/Urinary Tract: No adrenal nodule bilaterally. Bilateral kidneys enhance symmetrically. 1.7 cm fluid density lesion within the left kidney likely represents a simple renal cyst. No hydronephrosis. No hydroureter. The urinary bladder is unremarkable. On delayed imaging, there is no urothelial wall thickening and there are no filling defects in the opacified portions of the bilateral collecting systems or ureters. Stomach/Bowel: Stomach is within normal limits. No evidence of bowel wall thickening or dilatation. Appendix appears normal. Vascular/Lymphatic: Recanalized paraumbilical vein. Venous collateral are noted. Slight left ovarian venous congestion. The main portal, splenic, superior mesenteric veins are patent. No abdominal aorta or iliac aneurysm. Moderate to severe atherosclerotic plaque of the aorta and its branches. No abdominal, pelvic, or inguinal lymphadenopathy. Reproductive: Uterus and bilateral adnexa are unremarkable. Other: No intraperitoneal free fluid. No intraperitoneal free gas. No organized fluid collection. Musculoskeletal: No abdominal wall hernia or abnormality. Diffusely decreased bone density. No suspicious lytic or blastic osseous lesions. No acute displaced fracture. Multilevel degenerative changes of the spine with interval development of compression fractures of the T10, T12, L1 with greater than 90% height loss. IMPRESSION: 1. Cirrhosis with portal hypertension. No definite focal hepatic lesion; however limited evaluation on this single phase contrast study. Recommend MRI liver protocol for further evaluation. 2. Trace volume right pleural effusion. 3. Possible tiny hiatal hernia. 4. Interval development of T10, T12, L1 compression fractures with greater than 90% height loss. Correlate with point  tenderness to palpation to evaluate for acute component. 5. Aortic Atherosclerosis (ICD10-I70.0) including mitral annular calcifications. Electronically Signed   By: Iven Finn M.D.   On: 10/08/2020 19:58   DG CHEST PORT 1 VIEW  Result Date: 10/25/2020 CLINICAL DATA:  Shortness of breath. EXAM: PORTABLE CHEST 1 VIEW COMPARISON:  Chest x-ray 10/22/2020.  CT 10/13/2020. FINDINGS: Cardiomegaly. Low lung volumes with bibasilar infiltrates/edema and bilateral pleural effusions. No pneumothorax. Postsurgical changes right shoulder and thoracic spine. IMPRESSION: Cardiomegaly. Low lung volumes with bibasilar infiltrates/edema and bilateral pleural effusions. Findings progressed from prior exam. Electronically Signed   By: [place]   On: 10/25/2020 05:39   DG Chest Port 1 View  Result Date: 10/22/2020 CLINICAL DATA:  Lethargic. EXAM: PORTABLE CHEST 1 VIEW COMPARISON:  October 12, 2020 FINDINGS: Chronic appearing increased lung markings are seen without evidence of acute infiltrate, pleural effusion or pneumothorax. The cardiac silhouette is mildly enlarged.  Multiple chronic right-sided rib fractures are seen. A radiopaque right shoulder replacement is noted. There is evidence of prior vertebroplasty within the lower thoracic spine. IMPRESSION: Stable cardiomegaly and chronic changes without acute cardiopulmonary disease. Electronically Signed   By: Virgina Norfolk M.D.   On: 10/22/2020 23:11   DG CHEST PORT 1 VIEW  Result Date: 10/12/2020 CLINICAL DATA:  Shortness of breath. EXAM: PORTABLE CHEST 1 VIEW COMPARISON:  07/18/2013 FINDINGS: Mild cardiomegaly noted as well as pulmonary vascular congestion. Infiltrate or atelectasis is seen at the right lung base, and small right pleural effusion cannot be excluded. Left lung is clear. Multiple old right rib fracture deformities are noted, as well as right shoulder prosthesis. IMPRESSION: Right basilar atelectasis versus infiltrate, and possible small right  pleural effusion. Mild cardiomegaly and pulmonary vascular congestion. Electronically Signed   By: Marlaine Hind M.D.   On: 10/12/2020 08:21   DG Swallowing Func-Speech Pathology  Result Date: 10/24/2020 Objective Swallowing Evaluation: Type of Study: MBS-Modified Barium Swallow Study  Patient Details Name: [name] MRN: [id number] Date of Birth: 07/10/1950 Today's Date: 10/24/2020 Time: SLP Start Time (ACUTE ONLY): 24 -SLP Stop Time (ACUTE ONLY): 1230 SLP Time Calculation (min) (ACUTE ONLY): 10 min Past Medical History: Past Medical History: Diagnosis Date . ACROCHORDON 03/16/2010 . GI bleeding 06/2018 . Heart murmur  . Hypertension  . Hypothyroidism  . Liver cirrhosis ([place])  . Multiple duodenal ulcers 07/16/2018  associated with melena, anemia, gastritis. due to NSAID.  hx gastric ulcers and bleeding 2004, 2007 . Thyroid disease   S/p Radiation Past Surgical History: Past Surgical History: Procedure Laterality Date . BIOPSY  07/16/2018  Procedure: BIOPSY;  Surgeon: Mauri Pole, MD;  Location: [facility];  Service: Endoscopy;; . BREAST EXCISIONAL BIOPSY Left   benign . CHOLECYSTECTOMY N/A 12/07/2015  Procedure: LAPAROSCOPIC CHOLECYSTECTOMY WITH INTRAOPERATIVE CHOLANGIOGRAM;  Surgeon: Alphonsa Overall, MD;  Location: [facility];  Service: General;  Laterality: N/A; . ERCP N/A 12/06/2015  Procedure: ENDOSCOPIC RETROGRADE CHOLANGIOPANCREATOGRAPHY (ERCP);  Surgeon: Milus Banister, MD;  Location: [facility];  Service: Endoscopy;  Laterality: N/A; . ESOPHAGOGASTRODUODENOSCOPY (EGD) WITH PROPOFOL N/A 07/16/2018  Procedure: ESOPHAGOGASTRODUODENOSCOPY (EGD) WITH PROPOFOL;  Surgeon: Mauri Pole, MD;  Location: [facility];  Service: Endoscopy;  Laterality: N/A; . IM NAILING TIBIA Right 07/19/2012  DR Maureen Ralphs . IR VERTEBROPLASTY CERV/THOR BX INC UNI/BIL INC/INJECT/IMAGING  10/17/2020 . IR VERTEBROPLASTY EA ADDL (T&LS) BX INC UNI/BIL INC INJECT/IMAGING  10/17/2020 . SHOULDER HEMI-ARTHROPLASTY Right 07/22/2013   Procedure: RIGHT SHOULDER HEMI-ARTHROPLASTY;  Surgeon: Augustin Schooling, MD;  Location: [facility];  Service: Orthopedics;  Laterality: Right; . TIBIA IM NAIL INSERTION Right 07/19/2013  Procedure: INTRAMEDULLARY (IM) NAIL TIBIAL;  Surgeon: Gearlean Alf, MD;  Location: [facility];  Service: Orthopedics;  Laterality: Right; . TUBAL LIGATION   HPI: Patient is a 70 y.o. female with PMH: liver cirrhosis, recently diagnosed with afib and compression fracture of thoracic spince during which patient underwent kyphoplasty and has been on narcotic pain relief medication, EGD in 2020 found multiple partially obstructing duodenal ulcers with adherent clot; suspicious for NSAID induced etiology. She was brought to ER secondary to family observing increasing confusion. In ER, patient was c/o abdominal discomfort and found to be lethargic when MD was examining her. CT head was unremarkable but CT abdomen, pelvis showed worsening consolidation  in right lower lung field concerning for PNA. COVID test negative.  Subjective: alert, cooperative Assessment / Plan / Recommendation CHL IP CLINICAL IMPRESSIONS 10/24/2020 Clinical Impression Patient presents  with a mild oropharyngeal dysphagia but without aspiration. During oral phase of swallow, patient exhibited mild mastication and transit of bolus delays with regular solids as well as difficulty keeping bolus of thin liquid barium and barium tablet together. When give puree solids (applesauce),barium tablet then transited orally and pharyngeally without difficulty. Swallow initiation delay at level of vallecular sinus was observed with cup sips thin liquids and to vallecular heading into pyriform sinus delay with straw sips thin liquids. In addition, patient exhibited trace flash penetration of thin liquids during straw sips but which cleared fully during swallow. Patient did have some periodic throat clearing which may have been due to the trace vallecular residuals post initial swallows but  likely related to h/o GERD and esophageal dysfunction. Cervical esophageal phase of swallow revealed prominent cricopharyngeal bar, however this did not appear to impact patient's swallow function significantly. SLP recommending to continue with Regular solids, thin liquids diet. SLP Visit Diagnosis Dysphagia, oropharyngeal phase (R13.12) Attention and concentration deficit following -- Frontal lobe and executive function deficit following -- Impact on safety and function No limitations;Mild aspiration risk   CHL IP TREATMENT RECOMMENDATION 10/24/2020 Treatment Recommendations Therapy as outlined in treatment plan below   Prognosis 10/24/2020 Prognosis for Safe Diet Advancement Good Barriers to Reach Goals -- Barriers/Prognosis Comment -- CHL IP DIET RECOMMENDATION 10/24/2020 SLP Diet Recommendations Regular solids;Thin liquid Liquid Administration via Cup;Straw Medication Administration Whole meds with puree Compensations Slow rate;Small sips/bites Postural Changes --   CHL IP OTHER RECOMMENDATIONS 10/24/2020 Recommended Consults -- Oral Care Recommendations Oral care BID Other Recommendations --   CHL IP FOLLOW UP RECOMMENDATIONS 10/24/2020 Follow up Recommendations None   CHL IP FREQUENCY AND DURATION 10/24/2020 Speech Therapy Frequency (ACUTE ONLY) min 1 x/week Treatment Duration 1 week      CHL IP ORAL PHASE 10/24/2020 Oral Phase Impaired Oral - Pudding Teaspoon -- Oral - Pudding Cup -- Oral - Honey Teaspoon -- Oral - Honey Cup -- Oral - Nectar Teaspoon -- Oral - Nectar Cup -- Oral - Nectar Straw -- Oral - Thin Teaspoon -- Oral - Thin Cup WFL Oral - Thin Straw WFL Oral - Puree -- Oral - Mech Soft -- Oral - Regular Impaired mastication Oral - Multi-Consistency -- Oral - Pill Reduced posterior propulsion;Decreased bolus cohesion Oral Phase - Comment --  CHL IP PHARYNGEAL PHASE 10/24/2020 Pharyngeal Phase Impaired Pharyngeal- Pudding Teaspoon -- Pharyngeal -- Pharyngeal- Pudding Cup -- Pharyngeal -- Pharyngeal- Honey Teaspoon --  Pharyngeal -- Pharyngeal- Honey Cup -- Pharyngeal -- Pharyngeal- Nectar Teaspoon -- Pharyngeal -- Pharyngeal- Nectar Cup -- Pharyngeal -- Pharyngeal- Nectar Straw -- Pharyngeal -- Pharyngeal- Thin Teaspoon -- Pharyngeal -- Pharyngeal- Thin Cup Delayed swallow initiation-vallecula;Pharyngeal residue - valleculae Pharyngeal -- Pharyngeal- Thin Straw Delayed swallow initiation-vallecula;Delayed swallow initiation-pyriform sinuses;Penetration/Aspiration during swallow;Pharyngeal residue - valleculae Pharyngeal Material enters airway, remains ABOVE vocal cords then ejected out Pharyngeal- Puree WFL Pharyngeal -- Pharyngeal- Mechanical Soft -- Pharyngeal -- Pharyngeal- Regular WFL Pharyngeal -- Pharyngeal- Multi-consistency -- Pharyngeal -- Pharyngeal- Pill WFL Pharyngeal -- Pharyngeal Comment --  CHL IP CERVICAL ESOPHAGEAL PHASE 10/24/2020 Cervical Esophageal Phase Impaired Pudding Teaspoon -- Pudding Cup -- Honey Teaspoon -- Honey Cup -- Nectar Teaspoon -- Nectar Cup -- Nectar Straw -- Thin Teaspoon -- Thin Cup -- Thin Straw -- Puree -- Mechanical Soft -- Regular -- Multi-consistency -- Pill -- Cervical Esophageal Comment Patient with prominent cricopharyngeal bar however it did not appear to be significantly impacting patient's swallow function Angela Nevin, MA, CCC-SLP Speech Therapy  IR VERTEBROPLASTY CERV/THOR BX INC UNI/BIL INC/INJECT/IMAGING  Result Date: 10/19/2020 INDICATION: Severe midthoracic pain secondary to compression fractures at T9 and T10. EXAM: VERTEBROPLASTIES AT T9 AND T10 COMPARISON:  Recent MRI of the thoracolumbar spine of October 09, 2020. MEDICATIONS: As antibiotic prophylaxis, Ancef 2 g IV was ordered pre-procedure and administered intravenously within 1 hour of incision. ANESTHESIA/SEDATION: Moderate (conscious) sedation was employed during this procedure. A total of Versed 1 mg and Fentanyl 37.5 mcg was administered intravenously. Moderate Sedation Time: 40 minutes. The  patient's level of consciousness and vital signs were monitored continuously by radiology nursing throughout the procedure under my direct supervision. FLUOROSCOPY TIME:  Fluoroscopy Time: 14 minutes 6 seconds ([id number] mGy) COMPLICATIONS: None immediate. TECHNIQUE: Informed written consent was obtained from the patient after a thorough discussion of the procedural risks, benefits and alternatives. All questions were addressed. Maximal Sterile Barrier Technique was utilized including caps, mask, sterile gowns, sterile gloves, sterile drape, hand hygiene and skin antiseptic. A timeout was performed prior to the initiation of the procedure. PROCEDURE: The patient was placed prone on the fluoroscopic table. Nasal oxygen was administered. Physiologic monitoring was performed throughout the duration of the procedure. The skin overlying the thoracic region was prepped and draped in the usual sterile fashion. The T9 and T10 vertebral bodies were identified and the right pedicle at T10 and the left pedicle at T9 were infiltrated with 0.25% Bupivacaine. This was then followed by the advancement of a 13-gauge Cook needle through the pedicles into the anterior one-third at both levels. A gentle contrast injection demonstrated a trabecular pattern of contrast both levels, with early opacification of a paraspinous vein at the level of T9. This prompted the use of Gelfoam pledgets into both the [facility] spinal needles prior to the delivery of the methylmethacrylate mixture. At this time, methylmethacrylate mixture was reconstituted. Under biplane intermittent fluoroscopy, the methylmethacrylate was then injected into the T9 and T10 vertebral bodies with filling of the vertebral bodies. No extravasation was noted into the disk spaces or posteriorly into the spinal canal. No epidural venous contamination was seen. The needles were then removed. Hemostasis was achieved at the skin entry sites. There were no acute complications. Patient  tolerated the procedure well. The patient was returned to the floor in good condition. IMPRESSION: 1. Status post vertebral body augmentation for painful compression fracture at T9, and T10 using vertebroplasty technique as described without event. Electronically Signed   By: Luanne Bras M.D.   On: 10/18/2020 10:48   IR VERTEBROPLASTY EA ADDL (T&LS) BX INC UNI/BIL INC INJECT/IMAGING  Result Date: 10/19/2020 INDICATION: Severe midthoracic pain secondary to compression fractures at T9 and T10. EXAM: VERTEBROPLASTIES AT T9 AND T10 COMPARISON:  Recent MRI of the thoracolumbar spine of October 09, 2020. MEDICATIONS: As antibiotic prophylaxis, Ancef 2 g IV was ordered pre-procedure and administered intravenously within 1 hour of incision. ANESTHESIA/SEDATION: Moderate (conscious) sedation was employed during this procedure. A total of Versed 1 mg and Fentanyl 37.5 mcg was administered intravenously. Moderate Sedation Time: 40 minutes. The patient's level of consciousness and vital signs were monitored continuously by radiology nursing throughout the procedure under my direct supervision. FLUOROSCOPY TIME:  Fluoroscopy Time: 14 minutes 6 seconds (Q000111Q mGy) COMPLICATIONS: None immediate. TECHNIQUE: Informed written consent was obtained from the patient after a thorough discussion of the procedural risks, benefits and alternatives. All questions were addressed. Maximal Sterile Barrier Technique was utilized including caps, mask, sterile gowns, sterile gloves, sterile drape, hand hygiene and skin antiseptic. A  timeout was performed prior to the initiation of the procedure. PROCEDURE: The patient was placed prone on the fluoroscopic table. Nasal oxygen was administered. Physiologic monitoring was performed throughout the duration of the procedure. The skin overlying the thoracic region was prepped and draped in the usual sterile fashion. The T9 and T10 vertebral bodies were identified and the right pedicle at T10 and  the left pedicle at T9 were infiltrated with 0.25% Bupivacaine. This was then followed by the advancement of a 13-gauge Cook needle through the pedicles into the anterior one-third at both levels. A gentle contrast injection demonstrated a trabecular pattern of contrast both levels, with early opacification of a paraspinous vein at the level of T9. This prompted the use of Gelfoam pledgets into both the [facility] spinal needles prior to the delivery of the methylmethacrylate mixture. At this time, methylmethacrylate mixture was reconstituted. Under biplane intermittent fluoroscopy, the methylmethacrylate was then injected into the T9 and T10 vertebral bodies with filling of the vertebral bodies. No extravasation was noted into the disk spaces or posteriorly into the spinal canal. No epidural venous contamination was seen. The needles were then removed. Hemostasis was achieved at the skin entry sites. There were no acute complications. Patient tolerated the procedure well. The patient was returned to the floor in good condition. IMPRESSION: 1. Status post vertebral body augmentation for painful compression fracture at T9, and T10 using vertebroplasty technique as described without event. Electronically Signed   By: Luanne Bras M.D.   On: 10/18/2020 10:48   ECHOCARDIOGRAM COMPLETE  Result Date: 10/10/2020    ECHOCARDIOGRAM REPORT   Patient Name:   [name] Gropp Date of Exam: 10/10/2020 Medical Rec #:  [id number]         Height:       64.0 in Accession #:    [id number]        Weight:       179.5 lb Date of Birth:  1951/06/13         BSA:          1.868 m Patient Age:    51 years          BP:           174/92 mmHg Patient Gender: F                 HR:           72 bpm. Exam Location:  Inpatient Procedure: 2D Echo, Cardiac Doppler and Color Doppler Indications:    Cardiomyopathy-Unspecified I42.9  History:        Patient has prior history of Echocardiogram examinations, most                 recent 06/14/2015.  Risk Factors:Hypertension.  Sonographer:    Bernadene Person RDCS Referring Phys: [id number] ANKIT CHIRAG AMIN IMPRESSIONS  1. Left ventricular ejection fraction, by estimation, is 55 to 60%. The left ventricle has normal function. The left ventricle has no regional wall motion abnormalities. There is moderate asymmetric hypertrophy of the basal septal segment. The rest of the LV segments demonstrate mild left ventricular hypertrophy. Left ventricular diastolic parameters are consistent with Grade I diastolic dysfunction (impaired relaxation). Elevated left atrial pressure.  2. Right ventricular systolic function is normal. The right ventricular size is mildly enlarged. There is severely elevated pulmonary artery systolic pressure. The estimated right ventricular systolic pressure is 96.2 mmHg.  3. Left atrial size was severely dilated.  4. Right atrial size was mildly dilated.  5. The mitral valve is abnormal. There are mildly elevated gradients with flow acceleration noted across the mitral valve with mean gradient 50mmHg at HR 77bpm. MVA by continuity 2.7cm2 with no significant mitral stenosis. Mild mitral valve regurgitation.  6. The aortic valve is tricuspid. There is mild calcification of the aortic valve. There is moderate thickening of the aortic valve. The [place] appears fixed with trivial AR. Mild to moderate aortic valve sclerosis/calcification is present, without any evidence of aortic stenosis.  7. The inferior vena cava is normal in size with greater than 50% respiratory variability, suggesting right atrial pressure of 3 mmHg. Comparison(s): Compared to prior TTE in 2016, there is now severe pulmonary HTN present with PASP 62mmHg (previously 32mmHg). FINDINGS  Left Ventricle: Left ventricular ejection fraction, by estimation, is 55 to 60%. The left ventricle has normal function. The left ventricle has no regional wall motion abnormalities. The left ventricular internal cavity size was normal in size. There is   moderate asymmetric hypertrophy of the basal septal segment. The rest of the LV segments demonstrate mild left ventricular hypertrophy. Left ventricular diastolic parameters are consistent with Grade I diastolic dysfunction (impaired relaxation). Elevated left atrial pressure. Right Ventricle: The right ventricular size is mildly enlarged. No increase in right ventricular wall thickness. Right ventricular systolic function is normal. There is severely elevated pulmonary artery systolic pressure. The tricuspid regurgitant velocity is 3.80 m/s, and with an assumed right atrial pressure of 3 mmHg, the estimated right ventricular systolic pressure is [id number] mmHg. Left Atrium: Left atrial size was severely dilated. Right Atrium: Right atrial size was mildly dilated. Pericardium: There is no evidence of pericardial effusion. Mitral Valve: There are mildly elevated gradients with flow acceleration noted across the mitral valve with mean gradient 21mmHg at HR 77bpm. MVA by continuity 2.7cm2 with no significant stenosis. The mitral valve is abnormal. There is moderate thickening  of the mitral valve leaflet(s). There is moderate calcification of the mitral valve leaflet(s). Moderate mitral annular calcification. Mild mitral valve regurgitation. No evidence of mitral valve stenosis. Tricuspid Valve: The tricuspid valve is normal in structure. Tricuspid valve regurgitation is mild. Aortic Valve: The aortic valve is tricuspid. There is mild calcification of the aortic valve. There is moderate thickening of the aortic valve. Aortic valve regurgitation LCC is fixed with trivial AR. Mild to moderate aortic valve sclerosis/calcification  is present, without any evidence of aortic stenosis. Pulmonic Valve: The pulmonic valve was normal in structure. Pulmonic valve regurgitation is trivial. Aorta: The aortic root and ascending aorta are structurally normal, with no evidence of dilitation. Venous: The inferior vena cava is normal in size  with greater than 50% respiratory variability, suggesting right atrial pressure of 3 mmHg. IAS/Shunts: No atrial level shunt detected by color flow Doppler.  LEFT VENTRICLE PLAX 2D LVIDd:         4.50 cm  Diastology LVIDs:         3.00 cm  LV e' medial:    5.33 cm/s LV PW:         1.10 cm  LV E/e' medial:  22.7 LV IVS:        1.10 cm  LV e' lateral:   7.80 cm/s LVOT diam:     2.10 cm  LV E/e' lateral: 15.5 LV SV:         103 LV SV Index:   55 LVOT Area:     3.46 cm  RIGHT VENTRICLE RV S prime:     17.40 cm/s  TAPSE (M-mode): 2.6 cm LEFT ATRIUM             Index       RIGHT ATRIUM           Index LA diam:        5.60 cm 3.00 cm/m  RA Area:     19.20 cm LA Vol (A2C):   86.0 ml 46.03 ml/m RA Volume:   54.70 ml  29.28 ml/m LA Vol (A4C):   90.6 ml 48.49 ml/m LA Biplane Vol: 88.2 ml 47.21 ml/m  AORTIC VALVE LVOT Vmax:   119.00 cm/s LVOT Vmean:  78.800 cm/s LVOT VTI:    0.296 m  AORTA Ao Root diam: 3.00 cm Ao Asc diam:  3.30 cm MITRAL VALVE                TRICUSPID VALVE MV Area (PHT): 5.66 cm     TR Peak grad:   57.8 mmHg MV Decel Time: 134 msec     TR Vmax:        380.00 cm/s MV E velocity: 121.00 cm/s MV A velocity: 144.00 cm/s  SHUNTS MV E/A ratio:  0.84         Systemic VTI:  0.30 m                             Systemic Diam: 2.10 cm Gwyndolyn Kaufman MD Electronically signed by Gwyndolyn Kaufman MD Signature Date/Time: 10/10/2020/12:13:50 PM    Final    VAS [place] LOWER EXTREMITY VENOUS (DVT)  Result Date: 10/12/2020  Lower Venous DVT Study Indications: Atrial fibrillation.  Limitations: Poor ultrasound/tissue interface. Comparison Study: No prior studies. Performing Technologist: Darlin Coco RDMS,RVT  Examination Guidelines: A complete evaluation includes B-mode imaging, spectral Doppler, color Doppler, and power Doppler as needed of all accessible portions of each vessel. Bilateral testing is considered an integral part of a complete examination. Limited examinations for reoccurring indications may be  performed as noted. The reflux portion of the exam is performed with the patient in reverse Trendelenburg.  +---------+---------------+---------+-----------+----------+-------------------+ RIGHT    CompressibilityPhasicitySpontaneityPropertiesThrombus Aging      +---------+---------------+---------+-----------+----------+-------------------+ CFV      Full           Yes      Yes                                      +---------+---------------+---------+-----------+----------+-------------------+ SFJ      Full                                                             +---------+---------------+---------+-----------+----------+-------------------+ FV Prox  Full                                                             +---------+---------------+---------+-----------+----------+-------------------+ FV Mid   Full                                                             +---------+---------------+---------+-----------+----------+-------------------+  FV DistalFull                                                             +---------+---------------+---------+-----------+----------+-------------------+ PFV      Full                                                             +---------+---------------+---------+-----------+----------+-------------------+ POP      Full           Yes      Yes                                      +---------+---------------+---------+-----------+----------+-------------------+ PTV      Full                                                             +---------+---------------+---------+-----------+----------+-------------------+ PERO                                                  Not well visualized +---------+---------------+---------+-----------+----------+-------------------+   +---------+---------------+---------+-----------+----------+-------------------+ LEFT      CompressibilityPhasicitySpontaneityPropertiesThrombus Aging      +---------+---------------+---------+-----------+----------+-------------------+ CFV      Full           Yes      Yes                                      +---------+---------------+---------+-----------+----------+-------------------+ SFJ      Full                                                             +---------+---------------+---------+-----------+----------+-------------------+ FV Prox  Full                                                             +---------+---------------+---------+-----------+----------+-------------------+ FV Mid   Full                                                             +---------+---------------+---------+-----------+----------+-------------------+ FV DistalFull                                                             +---------+---------------+---------+-----------+----------+-------------------+  PFV      Full                                                             +---------+---------------+---------+-----------+----------+-------------------+ POP      Full           Yes      Yes                                      +---------+---------------+---------+-----------+----------+-------------------+ PTV      Full                                                             +---------+---------------+---------+-----------+----------+-------------------+ PERO                                                  Not well visualized +---------+---------------+---------+-----------+----------+-------------------+     Summary: RIGHT: - There is no evidence of deep vein thrombosis in the lower extremity. However, portions of this examination were limited- see technologist comments above.  - No cystic structure found in the popliteal fossa.  LEFT: - There is no evidence of deep vein thrombosis in the lower extremity. However, portions of this  examination were limited- see technologist comments above.  - No cystic structure found in the popliteal fossa.  *See table(s) above for measurements and observations. Electronically signed by Heath Lark on 10/12/2020 at 4:31:33 PM.    Final    US Abdomen Limited RUQ (LIVER/GB)  Result Date: 10/09/2020 CLINICAL DATA:  Transaminitis EXAM: ULTRASOUND ABDOMEN LIMITED RIGHT UPPER QUADRANT COMPARISON:  CT abdomen pelvis 10/08/2020 FINDINGS: Gallbladder: No gallstones or wall thickening visualized. No sonographic Murphy sign noted by sonographer. Common bile duct: Diameter: 8 mm Liver: Nodular hepatic contour suspicious for cirrhosis. No focal hepatic lesion. Portal vein is patent on color Doppler imaging with normal direction of blood flow towards the liver. Other: None. IMPRESSION: Cirrhotic liver morphology without focal hepatic lesion. Electronically Signed   By: Acquanetta Belling M.D.   On: 10/09/2020 09:06       Subjective: Patient seen and examined the bedside this morning.  Hemodynamically stable for discharge today.  Discharge Exam: Vitals:   10/27/20 0845 10/27/20 1303  BP:  (!) 137/91  Pulse:  99  Resp:  (!) 23  Temp:  97.9 F (36.6 C)  SpO2: 99% 99%   Vitals:   10/27/20 0745 10/27/20 0843 10/27/20 0845 10/27/20 1303  BP:    (!) 137/91  Pulse:    99  Resp:    (!) 23  Temp:    97.9 F (36.6 C)  TempSrc:    Oral  SpO2: 99% 99% 99% 99%  Weight:      Height:        General: Pt is alert, awake, not in acute distress Cardiovascular: RRR, S1/S2 +, no rubs, no gallops Respiratory: CTA bilaterally, no wheezing,  no rhonchi Abdominal: Soft, NT, ND, bowel sounds + Extremities: no edema, no cyanosis    The results of significant diagnostics from this hospitalization (including imaging, microbiology, ancillary and laboratory) are listed below for reference.     Microbiology: Recent Results (from the past 240 hour(s))  Blood Cultures (routine x 2)     Status: None (Preliminary  result)   Collection Time: 10/22/20 10:13 PM   Specimen: BLOOD  Result Value Ref Range Status   Specimen Description   Final    BLOOD RIGHT ANTECUBITAL Performed at [facility] [address] [place], [place] [address]    Special Requests   Final    BOTTLES DRAWN AEROBIC AND ANAEROBIC Blood Culture adequate volume Performed at [facility] [address], [place], [place] [address]    Culture   Final    NO GROWTH 4 DAYS Performed at [facility], [place] [address] [place], [place] [address]    Report Status PENDING  Incomplete  Blood Cultures (routine x 2)     Status: None (Preliminary result)   Collection Time: 10/22/20 10:18 PM   Specimen: BLOOD  Result Value Ref Range Status   Specimen Description   Final    BLOOD LEFT ANTECUBITAL Performed at [facility] [address], [place], [place] [address]    Special Requests   Final    BOTTLES DRAWN AEROBIC AND ANAEROBIC Blood Culture adequate volume Performed at [facility] [address], [place], [place] [address]    Culture   Final    NO GROWTH 4 DAYS Performed at [facility], [place] [address], [place], [place] [address]    Report Status PENDING  Incomplete  Resp Panel by RT-PCR (Flu A&B, Covid) Nasopharyngeal Swab     Status: None   Collection Time: 10/23/20  1:22 AM   Specimen: Nasopharyngeal Swab; Nasopharyngeal(NP) swabs in vial transport medium  Result Value Ref Range Status   SARS Coronavirus 2 by RT PCR NEGATIVE NEGATIVE Final    Comment: (NOTE) SARS-CoV-2 target nucleic acids are NOT DETECTED.  The SARS-CoV-2 RNA is generally detectable in upper respiratory specimens during the acute phase of infection. The lowest concentration of SARS-CoV-2 viral copies this assay can detect is 138 copies/mL. A negative result does not preclude SARS-Cov-2 infection and should not be used as the sole basis for treatment or other patient  management decisions. A negative result may occur with  improper specimen collection/handling, submission of specimen other than nasopharyngeal swab, presence of viral mutation(s) within the areas targeted by this assay, and inadequate number of viral copies(<138 copies/mL). A negative result must be combined with clinical observations, patient history, and epidemiological information. The expected result is Negative.  Fact Sheet for Patients:  EntrepreneurPulse.com.au  Fact Sheet for Healthcare Providers:  IncredibleEmployment.be  This test is no t yet approved or cleared by the [place] FDA and  has been authorized for detection and/or diagnosis of SARS-CoV-2 by FDA under an Emergency Use Authorization (EUA). This EUA will remain  in effect (meaning this test can be used) for the duration of the COVID-19 declaration under Section 564(b)(1) of the Act, 21 U.S.C.section 360bbb-3(b)(1), unless the authorization is terminated  or revoked sooner.       Influenza A by PCR NEGATIVE NEGATIVE Final   Influenza B by PCR NEGATIVE NEGATIVE Final    Comment: (NOTE) The Xpert Xpress SARS-CoV-2/FLU/RSV plus assay is intended as an aid in the  diagnosis of influenza from Nasopharyngeal swab specimens and should not be used as a sole basis for treatment. Nasal washings and aspirates are unacceptable for Xpert Xpress SARS-CoV-2/FLU/RSV testing.  Fact Sheet for Patients: EntrepreneurPulse.com.au  Fact Sheet for Healthcare Providers: IncredibleEmployment.be  This test is not yet approved or cleared by the [place] FDA and has been authorized for detection and/or diagnosis of SARS-CoV-2 by FDA under an Emergency Use Authorization (EUA). This EUA will remain in effect (meaning this test can be used) for the duration of the COVID-19 declaration under Section 564(b)(1) of the Act, 21 U.S.C. section 360bbb-3(b)(1),  unless the authorization is terminated or revoked.  Performed at [facility], [place] [address], [place], [place] [address]   Resp Panel by RT-PCR (Flu A&B, Covid) Nasopharyngeal Swab     Status: None   Collection Time: 10/27/20 10:15 AM   Specimen: Nasopharyngeal Swab; Nasopharyngeal(NP) swabs in vial transport medium  Result Value Ref Range Status   SARS Coronavirus 2 by RT PCR NEGATIVE NEGATIVE Final    Comment: (NOTE) SARS-CoV-2 target nucleic acids are NOT DETECTED.  The SARS-CoV-2 RNA is generally detectable in upper respiratory specimens during the acute phase of infection. The lowest concentration of SARS-CoV-2 viral copies this assay can detect is 138 copies/mL. A negative result does not preclude SARS-Cov-2 infection and should not be used as the sole basis for treatment or other patient management decisions. A negative result may occur with  improper specimen collection/handling, submission of specimen other than nasopharyngeal swab, presence of viral mutation(s) within the areas targeted by this assay, and inadequate number of viral copies(<138 copies/mL). A negative result must be combined with clinical observations, patient history, and epidemiological information. The expected result is Negative.  Fact Sheet for Patients:  EntrepreneurPulse.com.au  Fact Sheet for Healthcare Providers:  IncredibleEmployment.be  This test is no t yet approved or cleared by the [place] FDA and  has been authorized for detection and/or diagnosis of SARS-CoV-2 by FDA under an Emergency Use Authorization (EUA). This EUA will remain  in effect (meaning this test can be used) for the duration of the COVID-19 declaration under Section 564(b)(1) of the Act, 21 U.S.C.section 360bbb-3(b)(1), unless the authorization is terminated  or revoked sooner.       Influenza A by PCR NEGATIVE NEGATIVE Final   Influenza B by PCR NEGATIVE  NEGATIVE Final    Comment: (NOTE) The Xpert Xpress SARS-CoV-2/FLU/RSV plus assay is intended as an aid in the diagnosis of influenza from Nasopharyngeal swab specimens and should not be used as a sole basis for treatment. Nasal washings and aspirates are unacceptable for Xpert Xpress SARS-CoV-2/FLU/RSV testing.  Fact Sheet for Patients: EntrepreneurPulse.com.au  Fact Sheet for Healthcare Providers: IncredibleEmployment.be  This test is not yet approved or cleared by the [place] FDA and has been authorized for detection and/or diagnosis of SARS-CoV-2 by FDA under an Emergency Use Authorization (EUA). This EUA will remain in effect (meaning this test can be used) for the duration of the COVID-19 declaration under Section 564(b)(1) of the Act, 21 U.S.C. section 360bbb-3(b)(1), unless the authorization is terminated or revoked.  Performed at [facility], [place] [address] [place], [place] [address]      Labs: BNP (last 3 results) Recent Labs    10/11/20 2305 10/12/20 0802  BNP 949.4* 606.3*   Basic Metabolic Panel: Recent Labs  Lab 10/22/20 2244 10/23/20 0510 10/24/20 1033 10/25/20 0926 10/26/20 0317 10/27/20 0337  NA 130* 127* 131* 133*  131* 134*  K 2.8* 3.9 4.0 3.5 3.0* 4.0  CL 92* 93* 97* 97* 98 101  CO2 26 25 27 27 25 27   GLUCOSE 79 70 110* 89 91 97  BUN 14 13 15 13 11 10   CREATININE 0.59 0.46 0.50 0.48 0.44 0.44  CALCIUM 9.2 8.4* 8.5* 8.7* 8.6* 9.0  MG 1.7 1.8  --   --   --  1.6*   Liver Function Tests: Recent Labs  Lab 10/22/20 2244 10/23/20 0510  AST 24 23  ALT 18 18  ALKPHOS 83 76  BILITOT 1.2 0.9  PROT 6.0* 5.8*  ALBUMIN 3.4* 3.3*   No results for input(s): LIPASE, AMYLASE in the last 168 hours. Recent Labs  Lab 10/22/20 2244 10/23/20 0510  AMMONIA 19 31   CBC: Recent Labs  Lab 10/22/20 2235 10/22/20 2244 10/23/20 0510 10/26/20 0317  WBC  --  10.4 9.8 8.2  NEUTROABS  --   8.6* 8.4* 6.6  HGB 12.6 12.6 12.4 11.3*  HCT 37.0 34.0* 33.8* 30.1*  MCV  --  88.5 89.7 88.5  PLT  --  216 196 209   Cardiac Enzymes: No results for input(s): CKTOTAL, CKMB, CKMBINDEX, TROPONINI in the last 168 hours. BNP: Invalid input(s): POCBNP CBG: Recent Labs  Lab 10/23/20 1412 10/23/20 1610 10/23/20 2337 10/24/20 0520 10/24/20 1301  GLUCAP 88 156* 134* 100* 125*   D-Dimer No results for input(s): DDIMER in the last 72 hours. Hgb A1c No results for input(s): HGBA1C in the last 72 hours. Lipid Profile No results for input(s): CHOL, HDL, LDLCALC, TRIG, CHOLHDL, LDLDIRECT in the last 72 hours. Thyroid function studies No results for input(s): TSH, T4TOTAL, T3FREE, THYROIDAB in the last 72 hours.  Invalid input(s): FREET3 Anemia work up No results for input(s): VITAMINB12, FOLATE, FERRITIN, TIBC, IRON, RETICCTPCT in the last 72 hours. Urinalysis    Component Value Date/Time   COLORURINE YELLOW 10/22/2020 2213   APPEARANCEUR CLEAR 10/22/2020 2213   LABSPEC 1.021 10/22/2020 2213   PHURINE 5.0 10/22/2020 2213   GLUCOSEU NEGATIVE 10/22/2020 2213   HGBUR NEGATIVE 10/22/2020 2213   BILIRUBINUR NEGATIVE 10/22/2020 2213   KETONESUR 5 (A) 10/22/2020 2213   PROTEINUR 30 (A) 10/22/2020 2213   NITRITE NEGATIVE 10/22/2020 2213   LEUKOCYTESUR NEGATIVE 10/22/2020 2213   Sepsis Labs Invalid input(s): PROCALCITONIN,  WBC,  LACTICIDVEN Microbiology Recent Results (from the past 240 hour(s))  Blood Cultures (routine x 2)     Status: None (Preliminary result)   Collection Time: 10/22/20 10:13 PM   Specimen: BLOOD  Result Value Ref Range Status   Specimen Description   Final    BLOOD RIGHT ANTECUBITAL Performed at [facility], [place] [address], [place], [place] [address]    Special Requests   Final    BOTTLES DRAWN AEROBIC AND ANAEROBIC Blood Culture adequate volume Performed at [facility] [address], [place], [place] [address]     Culture   Final    NO GROWTH 4 DAYS Performed at [facility], [place] [address] [place], [place] [address]    Report Status PENDING  Incomplete  Blood Cultures (routine x 2)     Status: None (Preliminary result)   Collection Time: 10/22/20 10:18 PM   Specimen: BLOOD  Result Value Ref Range Status   Specimen Description   Final    BLOOD LEFT ANTECUBITAL Performed at [facility] [address] [place], [place] [address]    Special Requests  Final    BOTTLES DRAWN AEROBIC AND ANAEROBIC Blood Culture adequate volume Performed at [facility] [address] [place], [place] [address]    Culture   Final    NO GROWTH 4 DAYS Performed at [facility], [place] [address], [place], [place] [address]    Report Status PENDING  Incomplete  Resp Panel by RT-PCR (Flu A&B, Covid) Nasopharyngeal Swab     Status: None   Collection Time: 10/23/20  1:22 AM   Specimen: Nasopharyngeal Swab; Nasopharyngeal(NP) swabs in vial transport medium  Result Value Ref Range Status   SARS Coronavirus 2 by RT PCR NEGATIVE NEGATIVE Final    Comment: (NOTE) SARS-CoV-2 target nucleic acids are NOT DETECTED.  The SARS-CoV-2 RNA is generally detectable in upper respiratory specimens during the acute phase of infection. The lowest concentration of SARS-CoV-2 viral copies this assay can detect is 138 copies/mL. A negative result does not preclude SARS-Cov-2 infection and should not be used as the sole basis for treatment or other patient management decisions. A negative result may occur with  improper specimen collection/handling, submission of specimen other than nasopharyngeal swab, presence of viral mutation(s) within the areas targeted by this assay, and inadequate number of viral copies(<138 copies/mL). A negative result must be combined with clinical observations, patient history, and epidemiological information. The expected result is Negative.  Fact Sheet  for Patients:  EntrepreneurPulse.com.au  Fact Sheet for Healthcare Providers:  IncredibleEmployment.be  This test is no t yet approved or cleared by the [place] FDA and  has been authorized for detection and/or diagnosis of SARS-CoV-2 by FDA under an Emergency Use Authorization (EUA). This EUA will remain  in effect (meaning this test can be used) for the duration of the COVID-19 declaration under Section 564(b)(1) of the Act, 21 U.S.C.section 360bbb-3(b)(1), unless the authorization is terminated  or revoked sooner.       Influenza A by PCR NEGATIVE NEGATIVE Final   Influenza B by PCR NEGATIVE NEGATIVE Final    Comment: (NOTE) The Xpert Xpress SARS-CoV-2/FLU/RSV plus assay is intended as an aid in the diagnosis of influenza from Nasopharyngeal swab specimens and should not be used as a sole basis for treatment. Nasal washings and aspirates are unacceptable for Xpert Xpress SARS-CoV-2/FLU/RSV testing.  Fact Sheet for Patients: EntrepreneurPulse.com.au  Fact Sheet for Healthcare Providers: IncredibleEmployment.be  This test is not yet approved or cleared by the [place] FDA and has been authorized for detection and/or diagnosis of SARS-CoV-2 by FDA under an Emergency Use Authorization (EUA). This EUA will remain in effect (meaning this test can be used) for the duration of the COVID-19 declaration under Section 564(b)(1) of the Act, 21 U.S.C. section 360bbb-3(b)(1), unless the authorization is terminated or revoked.  Performed at [facility], [place] [address] [place],  [address]   Resp Panel by RT-PCR (Flu A&B, Covid) Nasopharyngeal Swab     Status: None   Collection Time: 10/27/20 10:15 AM   Specimen: Nasopharyngeal Swab; Nasopharyngeal(NP) swabs in vial transport medium  Result Value Ref Range Status   SARS Coronavirus 2 by RT PCR NEGATIVE NEGATIVE Final     Comment: (NOTE) SARS-CoV-2 target nucleic acids are NOT DETECTED.  The SARS-CoV-2 RNA is generally detectable in upper respiratory specimens during the acute phase of infection. The lowest concentration of SARS-CoV-2 viral copies this assay can detect is 138 copies/mL. A negative result does not preclude SARS-Cov-2 infection and should not be used as the sole basis for  treatment or other patient management decisions. A negative result may occur with  improper specimen collection/handling, submission of specimen other than nasopharyngeal swab, presence of viral mutation(s) within the areas targeted by this assay, and inadequate number of viral copies(<138 copies/mL). A negative result must be combined with clinical observations, patient history, and epidemiological information. The expected result is Negative.  Fact Sheet for Patients:  EntrepreneurPulse.com.au  Fact Sheet for Healthcare Providers:  [employer]  This test is no t yet approved or cleared by the [place] FDA and  has been authorized for detection and/or diagnosis of SARS-CoV-2 by FDA under an Emergency Use Authorization (EUA). This EUA will remain  in effect (meaning this test can be used) for the duration of the COVID-19 declaration under Section 564(b)(1) of the Act, 21 U.S.C.section 360bbb-3(b)(1), unless the authorization is terminated  or revoked sooner.       Influenza A by PCR NEGATIVE NEGATIVE Final   Influenza B by PCR NEGATIVE NEGATIVE Final    Comment: (NOTE) The Xpert Xpress SARS-CoV-2/FLU/RSV plus assay is intended as an aid in the diagnosis of influenza from Nasopharyngeal swab specimens and should not be used as a sole basis for treatment. Nasal washings and aspirates are unacceptable for Xpert Xpress SARS-CoV-2/FLU/RSV testing.  Fact Sheet for Patients: EntrepreneurPulse.com.au  Fact Sheet for Healthcare  Providers: [employer]  This test is not yet approved or cleared by the [place] FDA and has been authorized for detection and/or diagnosis of SARS-CoV-2 by FDA under an Emergency Use Authorization (EUA). This EUA will remain in effect (meaning this test can be used) for the duration of the COVID-19 declaration under Section 564(b)(1) of the Act, 21 U.S.C. section 360bbb-3(b)(1), unless the authorization is terminated or revoked.  Performed at [facility], [place] [address], [place], [place] [address]     Please note: You were cared for by a hospitalist during your hospital stay. Once you are discharged, your primary care physician will handle any further medical issues. Please note that NO REFILLS for any discharge medications will be authorized once you are discharged, as it is imperative that you return to your primary care physician (or establish a relationship with a primary care physician if you do not have one) for your post hospital discharge needs so that they can reassess your need for medications and monitor your lab values.    Time coordinating discharge: 40 minutes  SIGNED:   Shelly Coss, MD  [facility] 10/27/2020, 1:53 PM Pager [phone]  If 7PM-7AM, please contact night-coverage [email or web address] Password [email or web address]

## 2020-10-27 NOTE — TOC Transition Note (Signed)
Transition of Care [facility]) - CM/SW Discharge Note   Patient Details  Name: [name] MRN: [id number] Date of Birth: [birth year]  Transition of Care [facility]) CM/SW Contact:  Ross Ludwig, LCSW Phone Number: 10/27/2020, 3:26 PM   Clinical Narrative:     Patient to be d/c'ed today to [facility] room 208.  Patient and family agreeable to plans will transport via ems RN to call report to [phone].  Patient's daughter is aware of patient discharging today.      Final next level of care: Skilled Nursing Facility Barriers to Discharge: Barriers Resolved   Patient Goals and CMS Choice Patient states their goals for this hospitalization and ongoing recovery are:: To go to SNF for short term rehab, then return back home. CMS Medicare.gov Compare Post Acute Care list provided to:: Patient Represenative (must comment) Choice offered to / list presented to : Adult Children  Discharge Placement   Existing PASRR number confirmed : 10/26/20          Patient chooses bed at: [facility] Patient to be transferred to facility by: [facility] Name of family member notified: [name] patient's daughter Patient and family notified of of transfer: 10/27/20  Discharge Plan and Services     Post Acute Care Choice: [facility]                               Social Determinants of Health (SDOH) Interventions     Readmission Risk Interventions No flowsheet data found.

## 2020-10-27 NOTE — Progress Notes (Signed)
   10/26/20 2035  Provider Notification  Provider Name/Title B. Randol Kern  Date Provider Notified 10/26/20  Time Provider Notified 2040  Notification Type Page  Notification Reason Other (Comment) (Tele report patient RVR up to 150)  Provider response See new orders;Other (Comment) (Notify if it happens again)  Date of Provider Response 10/26/20  Time of Provider Response 2050

## 2020-10-27 NOTE — Progress Notes (Signed)
Report called to RN at [facility].  Awaiting [facility] for transport.

## 2020-10-28 LAB — CULTURE, BLOOD (ROUTINE X 2)
Culture: NO GROWTH
Culture: NO GROWTH
Special Requests: ADEQUATE
Special Requests: ADEQUATE

## 2020-12-20 ENCOUNTER — Other Ambulatory Visit: Payer: Self-pay | Admitting: Family Medicine

## 2020-12-20 DIAGNOSIS — Z1231 Encounter for screening mammogram for malignant neoplasm of breast: Secondary | ICD-10-CM

## 2020-12-27 ENCOUNTER — Other Ambulatory Visit: Payer: Self-pay | Admitting: Family Medicine

## 2020-12-27 DIAGNOSIS — N63 Unspecified lump in unspecified breast: Secondary | ICD-10-CM

## 2021-01-27 ENCOUNTER — Other Ambulatory Visit: Payer: Medicare Other

## 2021-01-27 ENCOUNTER — Inpatient Hospital Stay: Admission: RE | Admit: 2021-01-27 | Payer: Medicare Other | Source: Ambulatory Visit

## 2021-02-09 ENCOUNTER — Encounter: Payer: Self-pay | Admitting: Cardiology

## 2021-02-09 ENCOUNTER — Other Ambulatory Visit: Payer: Self-pay

## 2021-02-09 ENCOUNTER — Ambulatory Visit: Payer: Medicare Other | Admitting: Cardiology

## 2021-02-09 VITALS — BP 121/74 | HR 85 | Temp 98.0°F | Ht 64.0 in | Wt 122.0 lb

## 2021-02-09 DIAGNOSIS — K7460 Unspecified cirrhosis of liver: Secondary | ICD-10-CM

## 2021-02-09 DIAGNOSIS — F17200 Nicotine dependence, unspecified, uncomplicated: Secondary | ICD-10-CM

## 2021-02-09 DIAGNOSIS — I4819 Other persistent atrial fibrillation: Secondary | ICD-10-CM

## 2021-02-09 DIAGNOSIS — I725 Aneurysm of other precerebral arteries: Secondary | ICD-10-CM

## 2021-02-09 DIAGNOSIS — I10 Essential (primary) hypertension: Secondary | ICD-10-CM

## 2021-02-09 MED ORDER — DILTIAZEM HCL ER COATED BEADS 120 MG PO CP24: 120.0000 mg | ORAL_CAPSULE | Freq: Every day | ORAL | 0 refills | Status: DC

## 2021-02-09 NOTE — Progress Notes (Addendum)
Date:  02/09/2021   ID:  [name], DOB 06/08/1951, MRN [id number]  PCP:  Taylor Han, MD  Cardiologist:  Taylor Kras, DO, [facility] (established care 02/09/2021) Former Cardiology Providers: Dr. Margaretann Murray, Dr. Debara Murray  REASON FOR CONSULT: Atrial Fibrillation.   REQUESTING PHYSICIAN:  [name], [name],  [name] [address]  Chief Complaint  Patient presents with   Atrial Fibrillation   New Patient (Initial Visit)   Medication Refill    HPI  [name] is a 70 y.o. female who presents to the office with a chief complaint of " atrial fibrillation management and medication refill." Patient's past medical history and cardiovascular risk factors include: Atrial fibrillation (suggestive to be persistent), active tobacco use, history of alcohol abuse, liver cirrhosis with portal hypertension, pulmonary hypertension for echocardiogram, hypertension, postmenopausal female, advanced age.  She is referred to the office at the request of [name]* for evaluation of atrial fibrillation management.  Patient is accompanied by her daughter [name] at today's office visit.  Was diagnosed with atrial fibrillation back in April 2022 (please refer to Dr. Delphina Murray note 10/12/2020) and was treated with parenteral AV nodal blocking agents and IV heparin.  During the same hospitalization patient was thought to be not a good oral anticoagulation candidate given her history of alcohol abuse, cirrhosis with portal hypertension, history of GI bleed due to excessive alcohol in the past, and aneurysm of the terminal basilar artery.  Patient has been managed via rate control strategy on diltiazem 240 mg p.o. daily.  Patient states that she ran out of the medication for the last 1 week and her PCP encouraged her to follow-up with cardiology to have the medications refilled.  Prior history of stomach ulcers due to excessive alcohol use in the past.   No prior history of intracranial bleeding but does have aneurysm of the terminal basilar as noted on prior imaging study.  No prior history of direct-current cardioversion or radiofrequency ablation.  Patient states that she would like to be on oral anticoagulation to for thromboembolic prophylaxis.  Otherwise, she denies any chest pain or shortness of breath at rest or with effort related activities.  No recent hospitalizations for cardiovascular symptoms since April 2022.  FUNCTIONAL STATUS: No structured exercise program or daily routine.   ALLERGIES: No Known Allergies  MEDICATION LIST PRIOR TO VISIT: Current Meds  Medication Sig   Ascorbic Acid (VITAMIN C) 100 MG tablet Take 100 mg by mouth daily.   B Complex Vitamins (B COMPLEX 1 PO) Take 1 tablet by mouth daily.   calcium carbonate (OSCAL) 1500 (600 Ca) MG TABS tablet Take 600 mg of elemental calcium by mouth daily with breakfast.   citalopram (CELEXA) 20 MG tablet TAKE 1 TABLET BY MOUTH EVERY DAY. (Patient taking differently: Take 20 mg by mouth daily.)   diclofenac Sodium (VOLTAREN) 1 % GEL Apply 2 g topically 4 (four) times daily as needed (pain).   diclofenac Sodium (VOLTAREN) 1 % GEL Apply 4 g topically 4 (four) times daily.   Flaxseed, Linseed, (FLAX SEED OIL PO) Take 1 tablet by mouth daily.   folic acid (FOLVITE) 121 MCG tablet Take 400 mcg by mouth daily.   ipratropium-albuterol (DUONEB) 0.5-2.5 (3) MG/3ML SOLN Take 3 mLs by nebulization every 6 (six) hours as needed.   levothyroxine (SYNTHROID) 100 MCG tablet Take 1 tablet (100 mcg total) by mouth daily at 6 (six) AM. Please follow up repeat labs in 4-6 weeks with PCP.  lisinopril (ZESTRIL) 40 MG tablet Take 1 tablet by mouth daily.   LUTEIN PO Take 1 tablet by mouth daily.   Multiple Vitamin (MULTIVITAMIN WITH MINERALS) TABS tablet Take 1 tablet by mouth daily.   multivitamin-lutein (OCUVITE-LUTEIN) CAPS capsule Take 1 capsule by mouth daily.   omega-3 acid ethyl  esters (LOVAZA) 1 g capsule Take 1 capsule (1 g total) by mouth daily.   pantoprazole (PROTONIX) 40 MG tablet Take 1 tablet (40 mg total) by mouth at bedtime.   VITAMIN E PO Take 1 tablet by mouth 3 (three) times a week.   [DISCONTINUED] Bilberry, Vaccinium myrtillus, (BILBERRY FRUIT PO) Take 1 tablet by mouth daily.   [DISCONTINUED] hydrALAZINE (APRESOLINE) 10 MG tablet Take 3 tablets by mouth in the morning, at noon, and at bedtime.   [DISCONTINUED] lidocaine (LIDODERM) 5 % Place 1 patch onto the skin daily. Remove & Discard patch within 12 hours or as directed by MD     PAST MEDICAL HISTORY: Past Medical History:  Diagnosis Date   ACROCHORDON 03/16/2010   GI bleeding 06/2018   Heart murmur    Hypertension    Hypothyroidism    Liver cirrhosis ([place])    Multiple duodenal ulcers 07/16/2018   associated with melena, anemia, gastritis. due to NSAID.  hx gastric ulcers and bleeding 2004, 2007   Thyroid disease    S/p Radiation    PAST SURGICAL HISTORY: Past Surgical History:  Procedure Laterality Date   BIOPSY  07/16/2018   Procedure: BIOPSY;  Surgeon: Taylor Pole, MD;  Location: [facility];  Service: Endoscopy;;   BREAST EXCISIONAL BIOPSY Left    benign   CHOLECYSTECTOMY N/A 12/07/2015   Procedure: LAPAROSCOPIC CHOLECYSTECTOMY WITH INTRAOPERATIVE CHOLANGIOGRAM;  Surgeon: Taylor Overall, MD;  Location: [facility];  Service: General;  Laterality: N/A;   ERCP N/A 12/06/2015   Procedure: ENDOSCOPIC RETROGRADE CHOLANGIOPANCREATOGRAPHY (ERCP);  Surgeon: Taylor Banister, MD;  Location: [facility];  Service: Endoscopy;  Laterality: N/A;   ESOPHAGOGASTRODUODENOSCOPY (EGD) WITH PROPOFOL N/A 07/16/2018   Procedure: ESOPHAGOGASTRODUODENOSCOPY (EGD) WITH PROPOFOL;  Surgeon: Taylor Pole, MD;  Location: [facility];  Service: Endoscopy;  Laterality: N/A;   IM NAILING TIBIA Right 07/19/2012   DR [name]   IR VERTEBROPLASTY CERV/THOR BX INC UNI/BIL INC/INJECT/IMAGING  10/17/2020   IR  VERTEBROPLASTY EA ADDL (T&LS) BX INC UNI/BIL INC INJECT/IMAGING  10/17/2020   SHOULDER HEMI-ARTHROPLASTY Right 07/22/2013   Procedure: RIGHT SHOULDER HEMI-ARTHROPLASTY;  Surgeon: Taylor Schooling, MD;  Location: [facility];  Service: Orthopedics;  Laterality: Right;   TIBIA IM NAIL INSERTION Right 07/19/2013   Procedure: INTRAMEDULLARY (IM) NAIL TIBIAL;  Surgeon: Gearlean Alf, MD;  Location: [facility];  Service: Orthopedics;  Laterality: Right;   TUBAL LIGATION      FAMILY HISTORY: The patient family history includes Healthy in her daughter and daughter; Heart disease in her brother, brother, and mother; Lung cancer in her father; Psoriasis in her brother.  SOCIAL HISTORY:  The patient  reports that she has been smoking cigarettes. She has a 40.00 pack-year smoking history. She has never used smokeless tobacco. She reports that she does not drink alcohol and does not use drugs.  REVIEW OF SYSTEMS: Review of Systems  Constitutional: Negative for chills and fever.  HENT:  Negative for hoarse voice and nosebleeds.   Eyes:  Negative for discharge, double vision and pain.  Cardiovascular:  Negative for chest pain, claudication, dyspnea on exertion, leg swelling, near-syncope, orthopnea, palpitations, paroxysmal nocturnal dyspnea and syncope.  Respiratory:  Negative for hemoptysis and shortness of breath.   Musculoskeletal:  Negative for muscle cramps and myalgias.  Gastrointestinal:  Negative for abdominal pain, constipation, diarrhea, hematemesis, hematochezia, melena, nausea and vomiting.  Neurological:  Negative for dizziness and light-headedness.   PHYSICAL EXAM: Vitals with BMI 02/09/2021 10/27/2020 10/27/2020  Height _0  - -  Weight 122 lbs - -  BMI 34.19 - -  Systolic 622 297 989  Diastolic 74 91 88  Pulse 85 99 92   Orthostatic VS for the past 72 hrs (Last 3 readings):  Orthostatic BP Patient Position BP Location Cuff Size Orthostatic Pulse  02/09/21 1522 120/65 Standing Left Arm Normal 71   02/09/21 1521 120/77 Sitting Left Arm Normal 113  02/09/21 1520 122/62 Supine Left Arm Normal 85    CONSTITUTIONAL: Well-developed and well-nourished. No acute distress.  SKIN: Skin is warm and dry. No rash noted. No cyanosis. No pallor. No jaundice HEAD: Normocephalic and atraumatic.  EYES: No scleral icterus MOUTH/THROAT: Moist oral membranes.  NECK: No JVD present. No thyromegaly noted. No carotid bruits  LYMPHATIC: No visible cervical adenopathy.  CHEST Normal respiratory effort. No intercostal retractions  LUNGS: Clear to auscultation bilaterally.  No stridor. No wheezes. No rales.  CARDIOVASCULAR: Irregularly irregular, variable Q1-J9, soft holosystolic murmur heard at the apex, no gallops or rubs. ABDOMINAL: No apparent ascites. EXTREMITIES: right leg trace peripheral edema, swelling LLE HEMATOLOGIC: No significant bruising NEUROLOGIC: Oriented to person, place, and time. Nonfocal. Normal muscle tone.  PSYCHIATRIC: Normal mood and affect. Normal behavior. Cooperative  CARDIAC DATABASE: EKG: 02/09/2021: Atrial fibrillation, 117 bpm, old anteroseptal infarct, nonspecific T wave abnormality, premature ventricular contractions.   Echocardiogram: 10/10/2020: LVEF 55-60%, no regional wall motion abnormalities, moderate asymmetric hypertrophy of the basal septal segment, mild LVH, grade 1 diastolic impairment, elevated left atrial pressure, PASP 60.8 mmHg consistent with pulmonary hypertension, severely dilated left atrium, mildly dilated right atrium, mild MR, mild to moderate aortic valve sclerosis without stenosis.  Stress Testing: No results found for this or any previous visit from the past 1095 days.   Heart Catheterization: None  LABORATORY DATA: CBC Latest Ref Rng & Units 10/26/2020 10/23/2020 10/22/2020  WBC 4.0 - 10.5 K/uL 8.2 9.8 10.4  Hemoglobin 12.0 - 15.0 g/dL 11.3(L) 12.4 12.6  Hematocrit 36.0 - 46.0 % 30.1(L) 33.8(L) 34.0(L)  Platelets 150 - 400 K/uL 209 196 216     CMP Latest Ref Rng & Units 10/27/2020 10/26/2020 10/25/2020  Glucose 70 - 99 mg/dL 97 91 89  BUN 8 - 23 mg/dL _1 Creatinine 0.44 - 1.00 mg/dL 0.44 0.44 0.48  Sodium 135 - 145 mmol/L 134(L) 131(L) 133(L)  Potassium 3.5 - 5.1 mmol/L 4.0 3.0(L) 3.5  Chloride 98 - 111 mmol/L 101 98 97(L)  CO2 22 - 32 mmol/L _2 Calcium 8.9 - 10.3 mg/dL 9.0 8.6(L) 8.7(L)  Total Protein 6.5 - 8.1 g/dL - - -  Total Bilirubin 0.3 - 1.2 mg/dL - - -  Alkaline Phos 38 - 126 U/L - - -  AST 15 - 41 U/L - - -  ALT 0 - 44 U/L - - -    Lipid Panel     Component Value Date/Time   CHOL 139 07/09/2018 1114   TRIG 75 07/09/2018 1114   HDL 58 07/09/2018 1114   CHOLHDL 2.4 07/09/2018 1114   VLDL 26 12/06/2015 0426   LDLCALC 65 07/09/2018 1114    No components found for: NTPROBNP No results for input(s): PROBNP  in the last 8760 hours. Recent Labs    10/12/20 0802 10/22/20 2255 10/23/20 0510  TSH 0.241* 1.340 1.164    BMP Recent Labs    10/25/20 0926 10/26/20 0317 10/27/20 0337  NA 133* 131* 134*  K 3.5 3.0* 4.0  CL 97* 98 101  CO2 _0 GLUCOSE 89 91 97  BUN _1 CREATININE 0.48 0.44 0.44  CALCIUM 8.7* 8.6* 9.0  GFRNONAA >60 >60 >60    HEMOGLOBIN A1C Lab Results  Component Value Date   HGBA1C 4.7 (L) 10/12/2020   MPG 88.19 10/12/2020    IMPRESSION:    ICD-10-CM   1. Persistent atrial fibrillation (HCC)  I48.19 EKG 12-Lead    diltiazem (CARDIZEM CD) 120 MG 24 hr capsule    PCV MYOCARDIAL PERFUSION WITH LEXISCAN    2. Essential hypertension  I10     3. Tobacco use disorder  F17.200     4. Hepatic cirrhosis  K74.60 CMP14+EGFR    CBC    Protime-INR    PT and PTT    5. Basilar artery aneurysm (HCC)  I72.5        RECOMMENDATIONS: DESARAI BARRACK is a 70 y.o. female whose past medical history and cardiac risk factors include: Atrial fibrillation (suggestive to be persistent), active tobacco use, history of alcohol abuse, liver cirrhosis with portal  hypertension, pulmonary hypertension for echocardiogram, hypertension, postmenopausal female, advanced age.  Atrial fibrillation: Based on EMR diagnosed in April 2022. Most likely persistent. Rate control: Diltiazem.  (Refilled) Rhythm control: N/A. Thromboembolic prophylaxis: N/A CHA2DS2-VASc SCORE is 3 which correlates to 3.2 % risk of stroke per year. Since she is not on oral anticoagulation we discussed her risk of thromboembolic event.  Patient verbalizes that she would like to be on oral anticoagulation.  However, during her hospitalization in April 2022 she was seen by another cardiologist and was thought to be not an ideal candidate for oral anticoagulation given her history of alcohol use, cirrhosis with portal hypertension, history of GI bleeding due to excessive alcohol consumption, and imaging findings suggestive of aneurysm of the terminal basilar. Prior to initiating oral anticoagulation I recommended that she follow-up with neurosurgery to see if she is a candidate for anticoagulation or would it be contraindicated given her aneurysm. In addition, I recommended following up with gastroenterology or hepatology to see if her liver cirrhosis and portal hypertension is stable enough to consider oral anticoagulation. Instead of long-term oral anticoagulation we also discussed watchman device as an alternative for thromboembolic prophylaxis. Patient is interested in being considered for watchman device and I will refer her to [facility] as per her preference. In the interim, patient is aware to be cognizant of symptoms of stroke and if present to seek medical attention by going to the closest ER via EMS. Nuclear stress test recommended to evaluate for reversible ischemia given the recently diagnosed atrial fibrillation. We will check a CMP, CBC, PT/INR prior to next office visit.  Benign essential hypertension: Office blood pressures are well controlled. Medications  reconciled. Currently managed by primary care provider.  Tobacco use: Tobacco cessation counseling: Currently smoking 1 packs/day   Patient was informed of the dangers of tobacco abuse including stroke, cancer, and MI, as well as benefits of tobacco cessation. Patient is not willing to quit at this time. Spent 5 mins counseling patient cessation techniques. We discussed various methods to help quit smoking, including deciding on a date to quit, joining a support group, pharmacological  agents- nicotine gum/patch/lozenges.  I will reassess her progress at the next follow-up visit  History of terminal basilar aneurysm: Recommend follow-up with neurosurgery to see if she is a candidate for oral anticoagulation.  Or would to be considered a contraindication.  History of cirrhosis with portal hypertension: I have asked her to establish care with gastroenterology to see if it is medically safe to consider anticoagulation given her history of gastrointestinal bleeding in the past due to excessive alcohol use.  Total time spent: 94 minutes. As part of this consultation reviewed office notes provided by PCP, cardiology consultation/progress notes during her hospitalization in April 2022, independently reviewing labs from May 2022 which are available in epic, discussing disease management with both the patient and her daughter, complex decision making with regards to risks and complications of oral anticoagulation, coordination of care.  FINAL MEDICATION LIST END OF ENCOUNTER: Meds ordered this encounter  Medications   diltiazem (CARDIZEM CD) 120 MG 24 hr capsule    Sig: Take 1 capsule (120 mg total) by mouth daily.    Dispense:  30 capsule    Refill:  0     Medications Discontinued During This Encounter  Medication Reason   Bilberry, Vaccinium myrtillus, (BILBERRY FRUIT PO) Error   lidocaine (LIDODERM) 5 % Error   hydrALAZINE (APRESOLINE) 10 MG tablet Duplicate   oxyCODONE (OXY IR/ROXICODONE) 5  MG immediate release tablet Patient Preference   diltiazem (CARDIZEM CD) 120 MG 24 hr capsule Reorder     Current Outpatient Medications:    Ascorbic Acid (VITAMIN C) 100 MG tablet, Take 100 mg by mouth daily., Disp: , Rfl:    B Complex Vitamins (B COMPLEX 1 PO), Take 1 tablet by mouth daily., Disp: , Rfl:    calcium carbonate (OSCAL) 1500 (600 Ca) MG TABS tablet, Take 600 mg of elemental calcium by mouth daily with breakfast., Disp: , Rfl:    citalopram (CELEXA) 20 MG tablet, TAKE 1 TABLET BY MOUTH EVERY DAY. (Patient taking differently: Take 20 mg by mouth daily.), Disp: 90 tablet, Rfl: 0   diclofenac Sodium (VOLTAREN) 1 % GEL, Apply 2 g topically 4 (four) times daily as needed (pain)., Disp: 50 g, Rfl: 0   diclofenac Sodium (VOLTAREN) 1 % GEL, Apply 4 g topically 4 (four) times daily., Disp: , Rfl:    Flaxseed, Linseed, (FLAX SEED OIL PO), Take 1 tablet by mouth daily., Disp: , Rfl:    folic acid (FOLVITE) 967 MCG tablet, Take 400 mcg by mouth daily., Disp: , Rfl:    ipratropium-albuterol (DUONEB) 0.5-2.5 (3) MG/3ML SOLN, Take 3 mLs by nebulization every 6 (six) hours as needed., Disp: 360 mL, Rfl:    levothyroxine (SYNTHROID) 100 MCG tablet, Take 1 tablet (100 mcg total) by mouth daily at 6 (six) AM. Please follow up repeat labs in 4-6 weeks with PCP., Disp: 30 tablet, Rfl: 0   lisinopril (ZESTRIL) 40 MG tablet, Take 1 tablet by mouth daily., Disp: , Rfl:    LUTEIN PO, Take 1 tablet by mouth daily., Disp: , Rfl:    Multiple Vitamin (MULTIVITAMIN WITH MINERALS) TABS tablet, Take 1 tablet by mouth daily., Disp: , Rfl:    multivitamin-lutein (OCUVITE-LUTEIN) CAPS capsule, Take 1 capsule by mouth daily., Disp: , Rfl:    omega-3 acid ethyl esters (LOVAZA) 1 g capsule, Take 1 capsule (1 g total) by mouth daily., Disp: 30 capsule, Rfl: 0   pantoprazole (PROTONIX) 40 MG tablet, Take 1 tablet (40 mg total) by mouth at bedtime., Disp:  30 tablet, Rfl: 0   VITAMIN E PO, Take 1 tablet by mouth 3 (three)  times a week., Disp: , Rfl:    diltiazem (CARDIZEM CD) 120 MG 24 hr capsule, Take 1 capsule (120 mg total) by mouth daily., Disp: 30 capsule, Rfl: 0   hydrALAZINE (APRESOLINE) 25 MG tablet, Take 3 tablets (75 mg total) by mouth 3 (three) times daily., Disp: 270 tablet, Rfl: 0  Orders Placed This Encounter  Procedures   CMP14+EGFR   CBC   Protime-INR   PT and PTT   PCV MYOCARDIAL PERFUSION WITH LEXISCAN   EKG 12-Lead    There are no Patient Instructions on file for this visit.   --Continue cardiac medications as reconciled in final medication list. --Return in about 6 weeks (around 03/23/2021) for Follow up, A. fib. Or sooner if needed. --Continue follow-up with your primary care physician regarding the management of your other chronic comorbid conditions.  Patient's questions and concerns were addressed to her satisfaction. She voices understanding of the instructions provided during this encounter.   This note was created using a voice recognition software as a result there may be grammatical errors inadvertently enclosed that do not reflect the nature of this encounter. Every attempt is made to correct such errors.  [name], [place], [facility]  Pager: [phone] Office: [phone]

## 2021-03-11 ENCOUNTER — Other Ambulatory Visit: Payer: Self-pay | Admitting: Cardiology

## 2021-03-11 DIAGNOSIS — I4819 Other persistent atrial fibrillation: Secondary | ICD-10-CM

## 2021-03-13 ENCOUNTER — Other Ambulatory Visit: Payer: Self-pay

## 2021-03-13 ENCOUNTER — Ambulatory Visit: Payer: Medicare Other

## 2021-03-13 DIAGNOSIS — I4819 Other persistent atrial fibrillation: Secondary | ICD-10-CM

## 2021-03-23 ENCOUNTER — Encounter: Payer: Self-pay | Admitting: Cardiology

## 2021-03-23 ENCOUNTER — Ambulatory Visit: Payer: Medicare Other | Admitting: Cardiology

## 2021-03-23 ENCOUNTER — Other Ambulatory Visit: Payer: Self-pay

## 2021-03-23 VITALS — BP 126/71 | HR 79 | Temp 98.1°F | Ht 64.0 in | Wt 118.0 lb

## 2021-03-23 DIAGNOSIS — I10 Essential (primary) hypertension: Secondary | ICD-10-CM

## 2021-03-23 DIAGNOSIS — F17200 Nicotine dependence, unspecified, uncomplicated: Secondary | ICD-10-CM

## 2021-03-23 DIAGNOSIS — I480 Paroxysmal atrial fibrillation: Secondary | ICD-10-CM

## 2021-03-23 DIAGNOSIS — I725 Aneurysm of other precerebral arteries: Secondary | ICD-10-CM

## 2021-03-23 DIAGNOSIS — K7460 Unspecified cirrhosis of liver: Secondary | ICD-10-CM

## 2021-03-23 MED ORDER — METOPROLOL SUCCINATE ER 50 MG PO TB24: 50.0000 mg | ORAL_TABLET | Freq: Every day | ORAL | 0 refills | Status: DC

## 2021-03-23 NOTE — Progress Notes (Signed)
Date:  03/23/2021   ID:  [name], DOB Jan 01, 1951, MRN [id number]  PCP:  [name], MD  Cardiologist:  Rex Kras, DO, [facility] (established care 02/09/2021) Former Cardiology Providers: Dr. Margaretann Loveless, Dr. Debara Pickett  Date: 03/23/21 Last Office Visit: 02/09/2021  Chief Complaint  Patient presents with   Atrial Fibrillation   Results    HPI  [name] is a 70 y.o. female who presents to the office with a chief complaint of " atrial fibrillation management and medication refill." Patient's past medical history and cardiovascular risk factors include: Atrial fibrillation paroxysmal, active tobacco use, history of alcohol abuse, liver cirrhosis with portal hypertension, hypertension, postmenopausal female, advanced age.  She is referred to the office at the request of [name]* for evaluation of atrial fibrillation management.  Patient has been on rate control strategy with diltiazem and has tolerated the medication well.  However, patient states that she has been experiencing lower extremity swelling and constipation and would like to have an alternative rate controlling medication.  With regards to thromboembolic prophylaxis given her history of GI bleed/cirrhosis with portal hypertension and terminal basilar artery aneurysm she was recommended to follow-up and get clearance for oral anticoagulation by seeing gastroenterology and neurology respectively.  However, patient has not been able to arrange this since last office encounter.  She chooses not to be on oral anticoagulation due to a potential risk of bleeding.  We discussed being considered for watchman device at the last visit.  She does not want to be referred to [facility] care and as result was referred to [place].  She still has not had a chance to see structural heart disease at [facility] health regarding this either.  Reviewed  Reviewed the results of the stress test with the patient in  great detail and noted below for further reference.  FUNCTIONAL STATUS: No structured exercise program or daily routine.   ALLERGIES: No Known Allergies  MEDICATION LIST PRIOR TO VISIT: Current Meds  Medication Sig   Ascorbic Acid (VITAMIN C) 100 MG tablet Take 100 mg by mouth daily.   B Complex Vitamins (B COMPLEX 1 PO) Take 1 tablet by mouth daily.   calcium carbonate (OSCAL) 1500 (600 Ca) MG TABS tablet Take 600 mg of elemental calcium by mouth daily with breakfast.   citalopram (CELEXA) 20 MG tablet TAKE 1 TABLET BY MOUTH EVERY DAY. (Patient taking differently: Take 20 mg by mouth daily.)   diclofenac Sodium (VOLTAREN) 1 % GEL Apply 2 g topically 4 (four) times daily as needed (pain).   Flaxseed, Linseed, (FLAX SEED OIL PO) Take 1 tablet by mouth daily.   folic acid (FOLVITE) 086 MCG tablet Take 400 mcg by mouth daily.   gabapentin (NEURONTIN) 100 MG capsule Take by mouth.   ipratropium-albuterol (DUONEB) 0.5-2.5 (3) MG/3ML SOLN Take 3 mLs by nebulization every 6 (six) hours as needed.   lisinopril (ZESTRIL) 40 MG tablet Take 1 tablet by mouth daily.   LUTEIN PO Take 1 tablet by mouth daily.   metoprolol succinate (TOPROL-XL) 50 MG 24 hr tablet Take 1 tablet (50 mg total) by mouth daily. Take with or immediately following a meal.   Multiple Vitamin (MULTIVITAMIN WITH MINERALS) TABS tablet Take 1 tablet by mouth daily.   multivitamin-lutein (OCUVITE-LUTEIN) CAPS capsule Take 1 capsule by mouth daily.   VITAMIN E PO Take 1 tablet by mouth 3 (three) times a week.   [DISCONTINUED] diclofenac Sodium (VOLTAREN) 1 % GEL Apply 4  g topically 4 (four) times daily.   [DISCONTINUED] diltiazem (CARDIZEM CD) 120 MG 24 hr capsule Take 1 capsule by mouth once daily     PAST MEDICAL HISTORY: Past Medical History:  Diagnosis Date   ACROCHORDON 03/16/2010   GI bleeding 06/2018   Heart murmur    Hypertension    Hypothyroidism    Liver cirrhosis ([place])    Multiple duodenal ulcers 07/16/2018    associated with melena, anemia, gastritis. due to NSAID.  hx gastric ulcers and bleeding 2004, 2007   Thyroid disease    S/p Radiation    PAST SURGICAL HISTORY: Past Surgical History:  Procedure Laterality Date   BIOPSY  07/16/2018   Procedure: BIOPSY;  Surgeon: Mauri Pole, MD;  Location: [facility];  Service: Endoscopy;;   BREAST EXCISIONAL BIOPSY Left    benign   CHOLECYSTECTOMY N/A 12/07/2015   Procedure: LAPAROSCOPIC CHOLECYSTECTOMY WITH INTRAOPERATIVE CHOLANGIOGRAM;  Surgeon: Alphonsa Overall, MD;  Location: [facility];  Service: General;  Laterality: N/A;   ERCP N/A 12/06/2015   Procedure: ENDOSCOPIC RETROGRADE CHOLANGIOPANCREATOGRAPHY (ERCP);  Surgeon: Milus Banister, MD;  Location: [facility];  Service: Endoscopy;  Laterality: N/A;   ESOPHAGOGASTRODUODENOSCOPY (EGD) WITH PROPOFOL N/A 07/16/2018   Procedure: ESOPHAGOGASTRODUODENOSCOPY (EGD) WITH PROPOFOL;  Surgeon: Mauri Pole, MD;  Location: [facility];  Service: Endoscopy;  Laterality: N/A;   IM NAILING TIBIA Right 07/19/2012   DR Maureen Ralphs   IR VERTEBROPLASTY CERV/THOR BX INC UNI/BIL INC/INJECT/IMAGING  10/17/2020   IR VERTEBROPLASTY EA ADDL (T&LS) BX INC UNI/BIL INC INJECT/IMAGING  10/17/2020   SHOULDER HEMI-ARTHROPLASTY Right 07/22/2013   Procedure: RIGHT SHOULDER HEMI-ARTHROPLASTY;  Surgeon: Augustin Schooling, MD;  Location: [facility];  Service: Orthopedics;  Laterality: Right;   TIBIA IM NAIL INSERTION Right 07/19/2013   Procedure: INTRAMEDULLARY (IM) NAIL TIBIAL;  Surgeon: Gearlean Alf, MD;  Location: [facility];  Service: Orthopedics;  Laterality: Right;   TUBAL LIGATION      FAMILY HISTORY: The patient family history includes Healthy in her daughter and daughter; Heart disease in her brother, brother, and mother; Lung cancer in her father; Psoriasis in her brother.  SOCIAL HISTORY:  The patient  reports that she has been smoking cigarettes. She has a 40.00 pack-year smoking history. She has never used smokeless tobacco.  She reports that she does not drink alcohol and does not use drugs.  REVIEW OF SYSTEMS: Review of Systems  Constitutional: Negative for chills and fever.  HENT:  Negative for hoarse voice and nosebleeds.   Eyes:  Negative for discharge, double vision and pain.  Cardiovascular:  Negative for chest pain, claudication, dyspnea on exertion, leg swelling, near-syncope, orthopnea, palpitations, paroxysmal nocturnal dyspnea and syncope.  Respiratory:  Negative for hemoptysis and shortness of breath.   Musculoskeletal:  Negative for muscle cramps and myalgias.  Gastrointestinal:  Negative for abdominal pain, constipation, diarrhea, hematemesis, hematochezia, melena, nausea and vomiting.  Neurological:  Negative for dizziness and light-headedness.   PHYSICAL EXAM: Vitals with BMI 03/23/2021 03/23/2021 03/23/2021  Height - - -  Weight - - -  BMI - - -  Systolic 892 119 417  Diastolic 71 70 74  Pulse 79 67 61   Orthostatic VS for the past 72 hrs (Last 3 readings):  Patient Position BP Location Cuff Size  03/23/21 1500 Standing Left Arm Normal  03/23/21 1459 Sitting Left Arm Normal  03/23/21 1458 Supine Left Arm Normal    CONSTITUTIONAL: Well-developed and well-nourished. No acute distress.  SKIN: Skin is warm and  dry. No rash noted. No cyanosis. No pallor. No jaundice HEAD: Normocephalic and atraumatic.  EYES: No scleral icterus MOUTH/THROAT: Moist oral membranes.  NECK: No JVD present. No thyromegaly noted. No carotid bruits  LYMPHATIC: No visible cervical adenopathy.  CHEST Normal respiratory effort. No intercostal retractions  LUNGS: Clear to auscultation bilaterally.  No stridor. No wheezes. No rales.  CARDIOVASCULAR: Regular, bradycardic, I9-S8  soft holosystolic murmur heard at the apex, no gallops or rubs. ABDOMINAL: No apparent ascites. EXTREMITIES: right leg trace peripheral edema, swelling LLE HEMATOLOGIC: No significant bruising NEUROLOGIC: Oriented to person, place, and time.  Nonfocal. Normal muscle tone.  PSYCHIATRIC: Normal mood and affect. Normal behavior. Cooperative  CARDIAC DATABASE: EKG: 02/09/2021: Atrial fibrillation, 117 bpm, old anteroseptal infarct, nonspecific T wave abnormality, premature ventricular contractions.   03/23/2021: Normal sinus rhythm, 75 bpm, with premature ventricular contractions, consider old anteroseptal infarct, without underlying ischemia or injury pattern.  Echocardiogram: 10/10/2020: LVEF 55-60%, no regional wall motion abnormalities, moderate asymmetric hypertrophy of the basal septal segment, mild LVH, grade 1 diastolic impairment, elevated left atrial pressure, PASP 60.8 mmHg consistent with pulmonary hypertension, severely dilated left atrium, mildly dilated right atrium, mild MR, mild to moderate aortic valve sclerosis without stenosis.  Stress Testing: Lexiscan Tetrofosmin stress test 03/13/2021: 1 Day Rest/Stress Protocol. Stress EKG is non-diagnostic, as this is pharmacological stress test using Lexiscan. PVCS noted during rest, stress, and recovery. SPECT images notes small size, mildly reduced tracer uptake within the mid anterolateral segment, more pronounced on stress images.  This is likely due to increased gastric uptake on stress images compared to rest but ischemia cannot be excluded. Otherwise, no convincing evidence of reversible myocardial ischemia or prior infarct. Calculated LVEF 44%, visually appears preserved.  Low risk study, clinical correlation requested  Heart Catheterization: None  LABORATORY DATA: CBC Latest Ref Rng & Units 10/26/2020 10/23/2020 10/22/2020  WBC 4.0 - 10.5 K/uL 8.2 9.8 10.4  Hemoglobin 12.0 - 15.0 g/dL 11.3(L) 12.4 12.6  Hematocrit 36.0 - 46.0 % 30.1(L) 33.8(L) 34.0(L)  Platelets 150 - 400 K/uL 209 196 216    CMP Latest Ref Rng & Units 10/27/2020 10/26/2020 10/25/2020  Glucose 70 - 99 mg/dL 97 91 89  BUN 8 - 23 mg/dL 10 11 13   Creatinine 0.44 - 1.00 mg/dL 0.44 0.44 0.48  Sodium 135 - 145  mmol/L 134(L) 131(L) 133(L)  Potassium 3.5 - 5.1 mmol/L 4.0 3.0(L) 3.5  Chloride 98 - 111 mmol/L 101 98 97(L)  CO2 22 - 32 mmol/L 27 25 27   Calcium 8.9 - 10.3 mg/dL 9.0 8.6(L) 8.7(L)  Total Protein 6.5 - 8.1 g/dL - - -  Total Bilirubin 0.3 - 1.2 mg/dL - - -  Alkaline Phos 38 - 126 U/L - - -  AST 15 - 41 U/L - - -  ALT 0 - 44 U/L - - -    Lipid Panel     Component Value Date/Time   CHOL 139 07/09/2018 1114   TRIG 75 07/09/2018 1114   HDL 58 07/09/2018 1114   CHOLHDL 2.4 07/09/2018 1114   VLDL 26 12/06/2015 0426   LDLCALC 65 07/09/2018 1114    No components found for: NTPROBNP No results for input(s): PROBNP in the last 8760 hours. Recent Labs    10/12/20 0802 10/22/20 2255 10/23/20 0510  TSH 0.241* 1.340 1.164    BMP Recent Labs    10/25/20 0926 10/26/20 0317 10/27/20 0337  NA 133* 131* 134*  K 3.5 3.0* 4.0  CL 97* 98 101  CO2 27 25 27   GLUCOSE 89 91 97  BUN 13 11 10   CREATININE 0.48 0.44 0.44  CALCIUM 8.7* 8.6* 9.0  GFRNONAA >60 >60 >60    HEMOGLOBIN A1C Lab Results  Component Value Date   HGBA1C 4.7 (L) 10/12/2020   MPG 88.19 10/12/2020    IMPRESSION:    ICD-10-CM   1. Paroxysmal A-fib (HCC)  I48.0 EKG 12-Lead    metoprolol succinate (TOPROL-XL) 50 MG 24 hr tablet    2. Essential hypertension  I10     3. Tobacco use disorder  F17.200     4. Hepatic cirrhosis, unspecified hepatic cirrhosis type, unspecified whether ascites present ([place])  K74.60     5. Basilar artery aneurysm (HCC)  I72.5        RECOMMENDATIONS: [name] is a 70 y.o. female whose past medical history and cardiac risk factors include: Paroxysmal atrial fibrillation, active tobacco use, history of alcohol abuse, liver cirrhosis with portal hypertension, hypertension, postmenopausal female, advanced age.  Atrial fibrillation: Paroxysmal Based on EMR diagnosed in April 2022. Rate control: We will transition the patient from diltiazem to Toprol-XL. Rhythm control:  N/A. Thromboembolic prophylaxis: N/A CHA2DS2-VASc SCORE is 3 which correlates to 3.2 % risk of stroke per year. During her last hospitalization in April 2022 she was seen by multiple cardiologist from a different group who thought that she is not an ideal candidate for oral anticoagulation given her history of alcohol use, cirrhosis with portal hypertension, history of GI bleed, and imaging findings of terminal basilar artery aneurysm. Patient states that she will follow-up with GI and neurology to see if she is cleared from the respective fields to be on oral anticoagulation.  In the meantime we will send a referral again to [facility] health as per patient's request for her to be seen by their structural heart team for watchman evaluation for reasons mentioned above. Since the patient is not on oral anticoagulation she is made aware to be more cognizant of symptoms suggestive of stroke such as facial asymmetry, expressive or receptive aphasia, or focal neurological deficits.  If such symptoms arise she is asked to call 911 and go to the closest ER via EMS for further evaluation and management. CMP, CBC, PT/INR were ordered at the last office visit-still not performed.  Benign essential hypertension: Office blood pressures are well controlled. Medications reconciled. Currently managed by primary care provider.  Tobacco use: Tobacco cessation counseling: Currently smoking 1 packs/day   Patient was informed of the dangers of tobacco abuse including stroke, cancer, and MI, as well as benefits of tobacco cessation. Patient is not willing to quit at this time. Spent 7 mins counseling patient cessation techniques. We discussed various methods to help quit smoking, including deciding on a date to quit, joining a support group, pharmacological agents- nicotine gum/patch/lozenges.  I will reassess her progress at the next follow-up visit  History of terminal basilar aneurysm: Recommend follow-up with  neurology/neurosurgery to see if she is a candidate for oral anticoagulation.  Or would to be considered a contraindication.  History of cirrhosis with portal hypertension: I have asked her to establish care with gastroenterology to see if it is medically safe to consider anticoagulation given her history of gastrointestinal bleeding in the presence of cirrhosis and portal hypertension.  FINAL MEDICATION LIST END OF ENCOUNTER: Meds ordered this encounter  Medications   metoprolol succinate (TOPROL-XL) 50 MG 24 hr tablet    Sig: Take 1 tablet (50 mg total) by mouth daily.  Take with or immediately following a meal.    Dispense:  30 tablet    Refill:  0    Medications Discontinued During This Encounter  Medication Reason   diclofenac Sodium (VOLTAREN) 1 % GEL Error   diltiazem (CARDIZEM CD) 120 MG 24 hr capsule Patient Preference     Current Outpatient Medications:    Ascorbic Acid (VITAMIN C) 100 MG tablet, Take 100 mg by mouth daily., Disp: , Rfl:    B Complex Vitamins (B COMPLEX 1 PO), Take 1 tablet by mouth daily., Disp: , Rfl:    calcium carbonate (OSCAL) 1500 (600 Ca) MG TABS tablet, Take 600 mg of elemental calcium by mouth daily with breakfast., Disp: , Rfl:    citalopram (CELEXA) 20 MG tablet, TAKE 1 TABLET BY MOUTH EVERY DAY. (Patient taking differently: Take 20 mg by mouth daily.), Disp: 90 tablet, Rfl: 0   diclofenac Sodium (VOLTAREN) 1 % GEL, Apply 2 g topically 4 (four) times daily as needed (pain)., Disp: 50 g, Rfl: 0   Flaxseed, Linseed, (FLAX SEED OIL PO), Take 1 tablet by mouth daily., Disp: , Rfl:    folic acid (FOLVITE) 696 MCG tablet, Take 400 mcg by mouth daily., Disp: , Rfl:    gabapentin (NEURONTIN) 100 MG capsule, Take by mouth., Disp: , Rfl:    ipratropium-albuterol (DUONEB) 0.5-2.5 (3) MG/3ML SOLN, Take 3 mLs by nebulization every 6 (six) hours as needed., Disp: 360 mL, Rfl:    lisinopril (ZESTRIL) 40 MG tablet, Take 1 tablet by mouth daily., Disp: , Rfl:     LUTEIN PO, Take 1 tablet by mouth daily., Disp: , Rfl:    metoprolol succinate (TOPROL-XL) 50 MG 24 hr tablet, Take 1 tablet (50 mg total) by mouth daily. Take with or immediately following a meal., Disp: 30 tablet, Rfl: 0   Multiple Vitamin (MULTIVITAMIN WITH MINERALS) TABS tablet, Take 1 tablet by mouth daily., Disp: , Rfl:    multivitamin-lutein (OCUVITE-LUTEIN) CAPS capsule, Take 1 capsule by mouth daily., Disp: , Rfl:    VITAMIN E PO, Take 1 tablet by mouth 3 (three) times a week., Disp: , Rfl:    hydrALAZINE (APRESOLINE) 25 MG tablet, Take 3 tablets (75 mg total) by mouth 3 (three) times daily., Disp: 270 tablet, Rfl: 0   levothyroxine (SYNTHROID) 100 MCG tablet, Take 1 tablet (100 mcg total) by mouth daily at 6 (six) AM. Please follow up repeat labs in 4-6 weeks with PCP., Disp: 30 tablet, Rfl: 0   omega-3 acid ethyl esters (LOVAZA) 1 g capsule, Take 1 capsule (1 g total) by mouth daily., Disp: 30 capsule, Rfl: 0   pantoprazole (PROTONIX) 40 MG tablet, Take 1 tablet (40 mg total) by mouth at bedtime., Disp: 30 tablet, Rfl: 0  Orders Placed This Encounter  Procedures   EKG 12-Lead    There are no Patient Instructions on file for this visit.   --Continue cardiac medications as reconciled in final medication list. --Return in about 3 months (around 06/22/2021) for Follow up, A. fib. Or sooner if needed. --Continue follow-up with your primary care physician regarding the management of your other chronic comorbid conditions.  Patient's questions and concerns were addressed to her satisfaction. She voices understanding of the instructions provided during this encounter.   This note was created using a voice recognition software as a result there may be grammatical errors inadvertently enclosed that do not reflect the nature of this encounter. Every attempt is made to correct such errors.  Dammon Makarewicz [place],  DO, [facility]  Pager: [phone] Office: [phone]

## 2021-04-21 ENCOUNTER — Encounter: Payer: Self-pay | Admitting: Gastroenterology

## 2021-04-26 ENCOUNTER — Other Ambulatory Visit: Payer: Self-pay

## 2021-04-26 ENCOUNTER — Telehealth: Payer: Self-pay | Admitting: Cardiology

## 2021-04-26 DIAGNOSIS — I480 Paroxysmal atrial fibrillation: Secondary | ICD-10-CM

## 2021-04-26 MED ORDER — METOPROLOL SUCCINATE ER 50 MG PO TB24: 50.0000 mg | ORAL_TABLET | Freq: Every day | ORAL | 0 refills | Status: DC

## 2021-04-26 NOTE — Telephone Encounter (Signed)
Patient requesting refill for metoprolol.

## 2021-04-26 NOTE — Telephone Encounter (Signed)
Refill sent to pharmacy.   

## 2021-05-10 ENCOUNTER — Other Ambulatory Visit: Payer: Self-pay | Admitting: Family Medicine

## 2021-05-10 DIAGNOSIS — N6310 Unspecified lump in the right breast, unspecified quadrant: Secondary | ICD-10-CM

## 2021-06-21 ENCOUNTER — Other Ambulatory Visit: Payer: Self-pay

## 2021-06-21 ENCOUNTER — Ambulatory Visit (AMBULATORY_SURGERY_CENTER): Payer: Medicare Other | Admitting: *Deleted

## 2021-06-21 ENCOUNTER — Telehealth: Payer: Self-pay | Admitting: *Deleted

## 2021-06-21 VITALS — Ht 63.0 in | Wt 125.0 lb

## 2021-06-21 DIAGNOSIS — Z1211 Encounter for screening for malignant neoplasm of colon: Secondary | ICD-10-CM

## 2021-06-21 MED ORDER — PEG 3350-KCL-NA BICARB-NACL 420 G PO SOLR
4000.0000 mL | Freq: Once | ORAL | 0 refills | Status: AC
Start: 2021-06-21 — End: 2021-06-21

## 2021-06-21 NOTE — Telephone Encounter (Signed)
Message left for pt. To call back to reschedule pre-visit by 5 pm or we will have to cancel procedure 07/06/23,call back number left.

## 2021-06-21 NOTE — Progress Notes (Signed)
No egg or soy allergy known to patient  No issues known to pt with past sedation with any surgeries or procedures Patient denies ever being told they had issues or difficulty with intubation  No FH of Malignant Hyperthermia Pt is not on diet pills Pt is not on  home 02  Pt is not on blood thinners  Pt denies issues with constipation  Hx of  A fib   Pt is fully vaccinated  for Covid    NO PA's for preps discussed with pt In PV today  Discussed with pt there will be an out-of-pocket cost for prep and that varies from $0 to 70 +  dollars - pt verbalized understanding   Due to the COVID-19 pandemic we are asking patients to follow certain guidelines in PV and the [facility]   Pt aware of COVID protocols and [facility] guidelines  PV completed over the phone. Pt verified name, DOB, address and insurance during PV today.  Pt mailed instruction packet with copy of consent form to read and not return, and instructions.   Pt encouraged to call with questions or issues.  If pt has My chart, procedure instructions sent via My Chart

## 2021-06-21 NOTE — Telephone Encounter (Signed)
Pt.called back and pre-visit completed.

## 2021-06-23 ENCOUNTER — Ambulatory Visit: Payer: Medicare Other | Admitting: Cardiology

## 2021-06-27 ENCOUNTER — Ambulatory Visit
Admission: RE | Admit: 2021-06-27 | Discharge: 2021-06-27 | Disposition: A | Discharge status: Home / Self Care | Payer: Medicare Other | Source: Ambulatory Visit | Attending: Family Medicine | Admitting: Family Medicine

## 2021-06-27 DIAGNOSIS — N6310 Unspecified lump in the right breast, unspecified quadrant: Secondary | ICD-10-CM

## 2021-06-27 IMAGING — MG DIGITAL DIAGNOSTIC BILAT W/ TOMO W/ CAD
6 of 10 series · 6 of 30 positions shown · non-contrast
Comparison: Previous exam(s).

CLINICAL DATA: 70-year-old female presenting for delayed follow-up
of a right breast mass. The patient was last seen in [DATE]. She has lost 60 pounds since she was hospitalized in [DATE]. The patient states her appetite is normal, and she is unsure
why she is losing weight.

EXAM:
DIGITAL DIAGNOSTIC BILATERAL MAMMOGRAM WITH TOMOSYNTHESIS AND CAD;
ULTRASOUND RIGHT BREAST LIMITED
TECHNIQUE: Bilateral digital diagnostic mammography and breast tomosynthesis
was performed. The images were evaluated with computer-aided
detection.; Targeted ultrasound examination of the right breast was
performed

[L CC synth-2D]
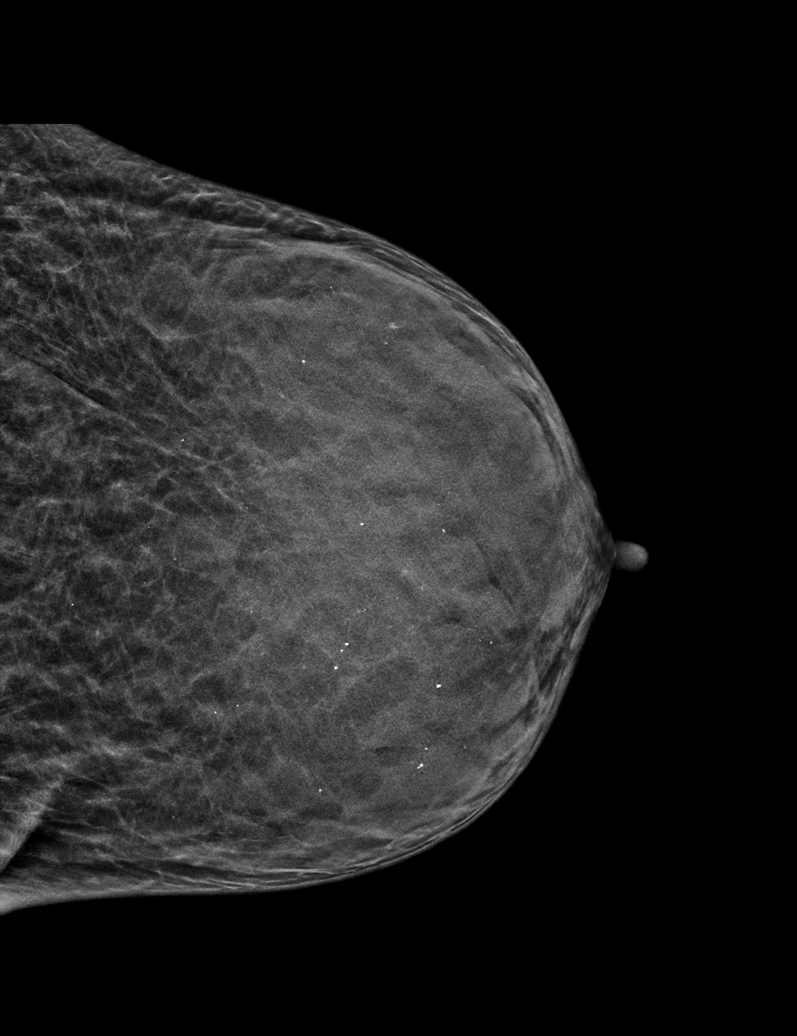

[R CC synth-2D]
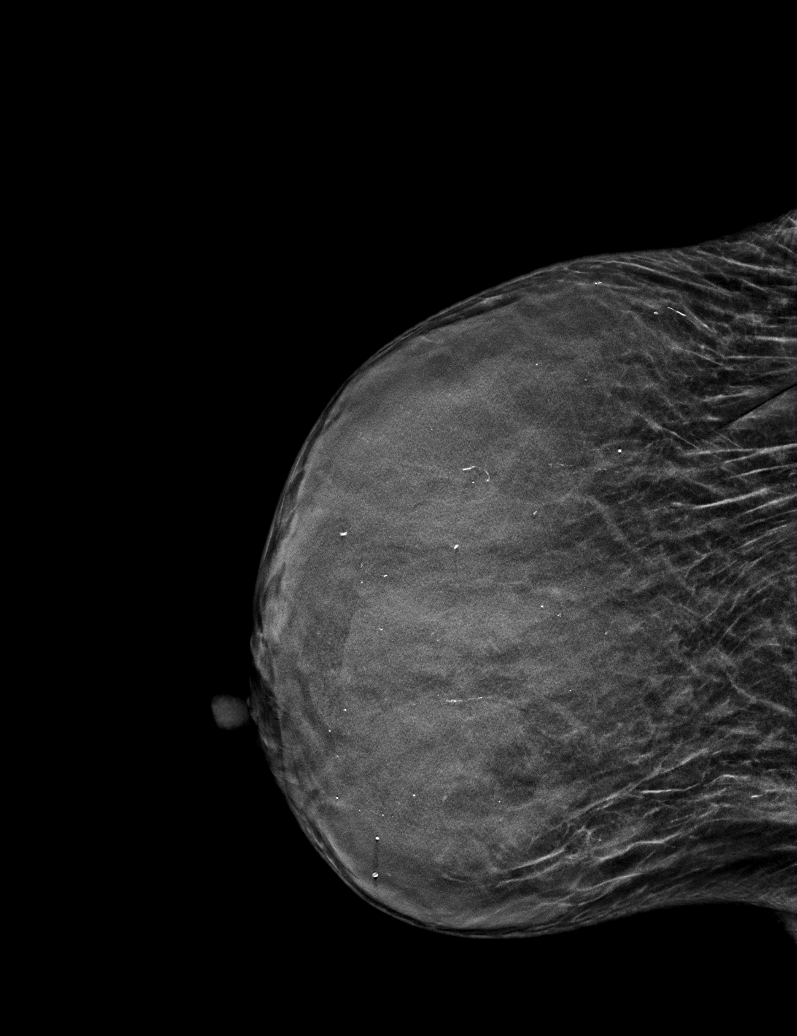

[L MLO synth-2D (1 of 2)]
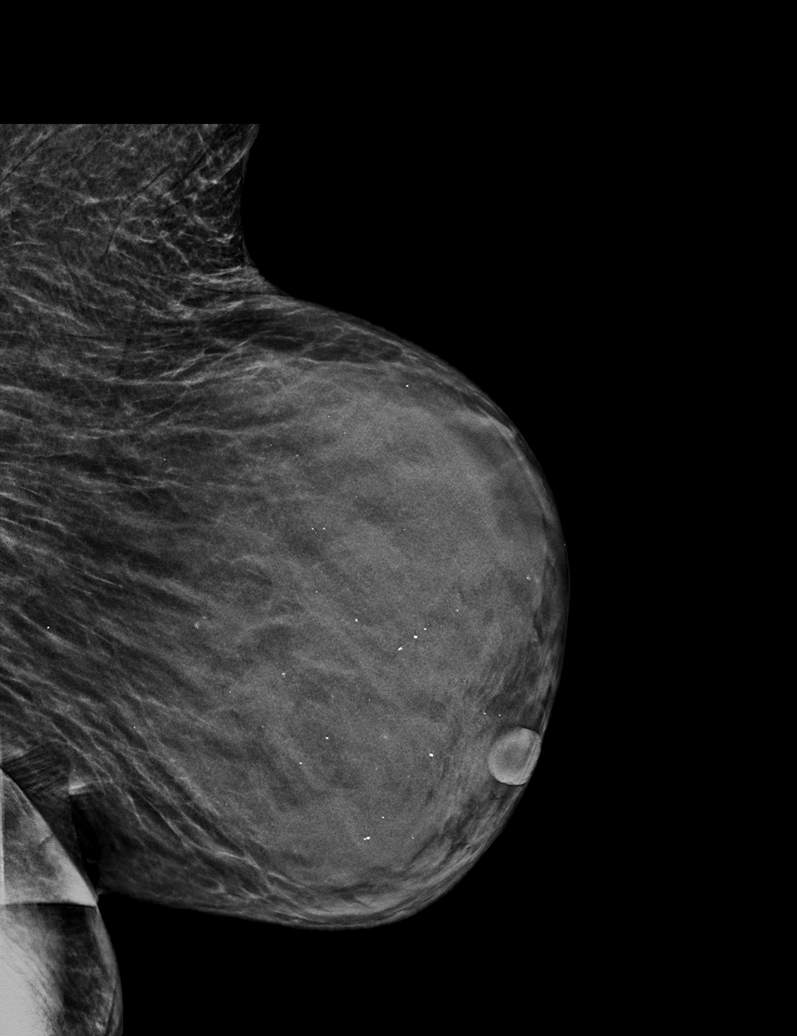

[L MLO synth-2D (2 of 2)]
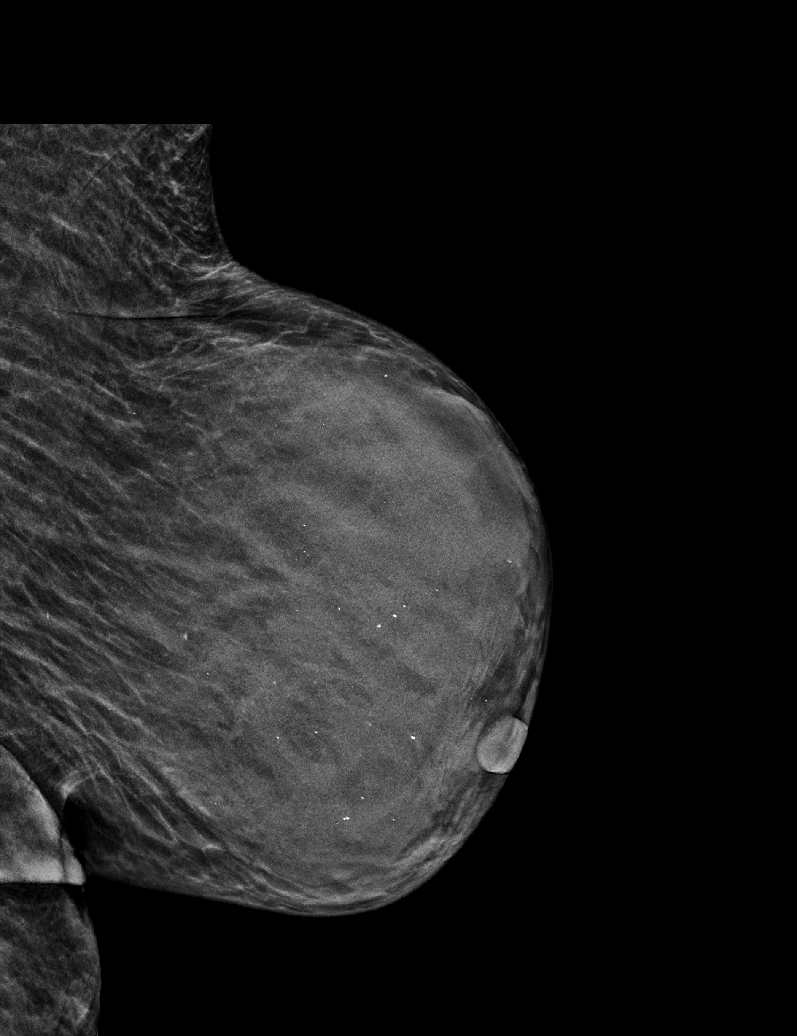

[R MLO synth-2D]
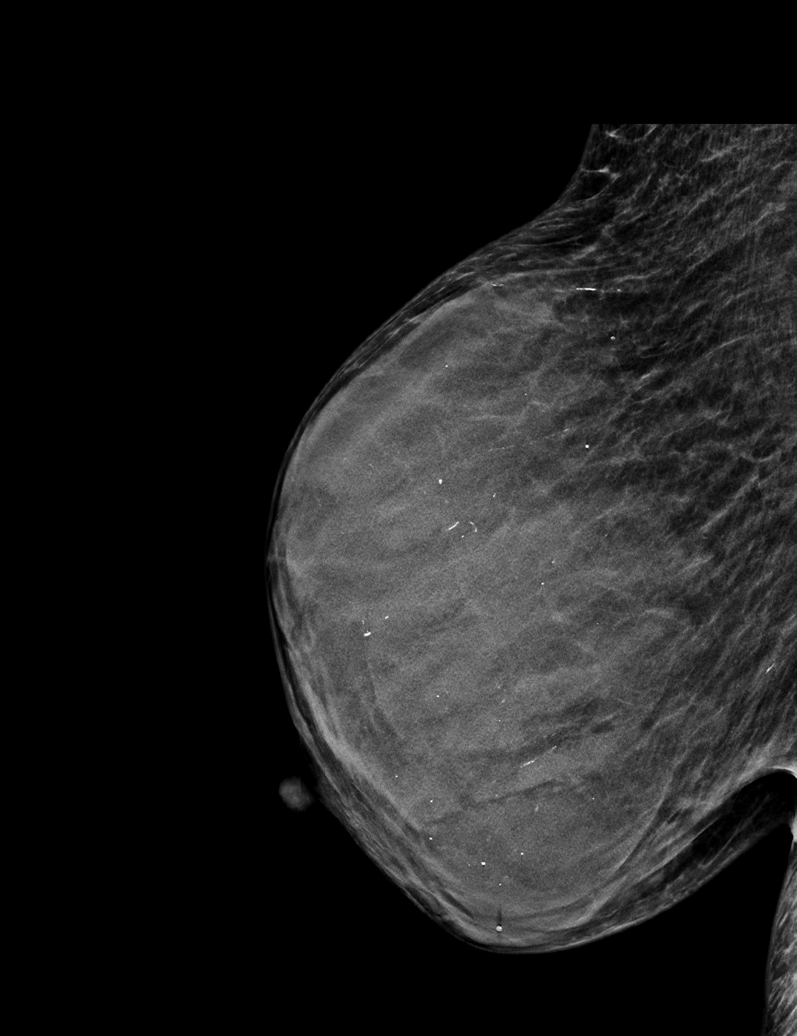

[R CC tomo · tomo slice 19/38.0]
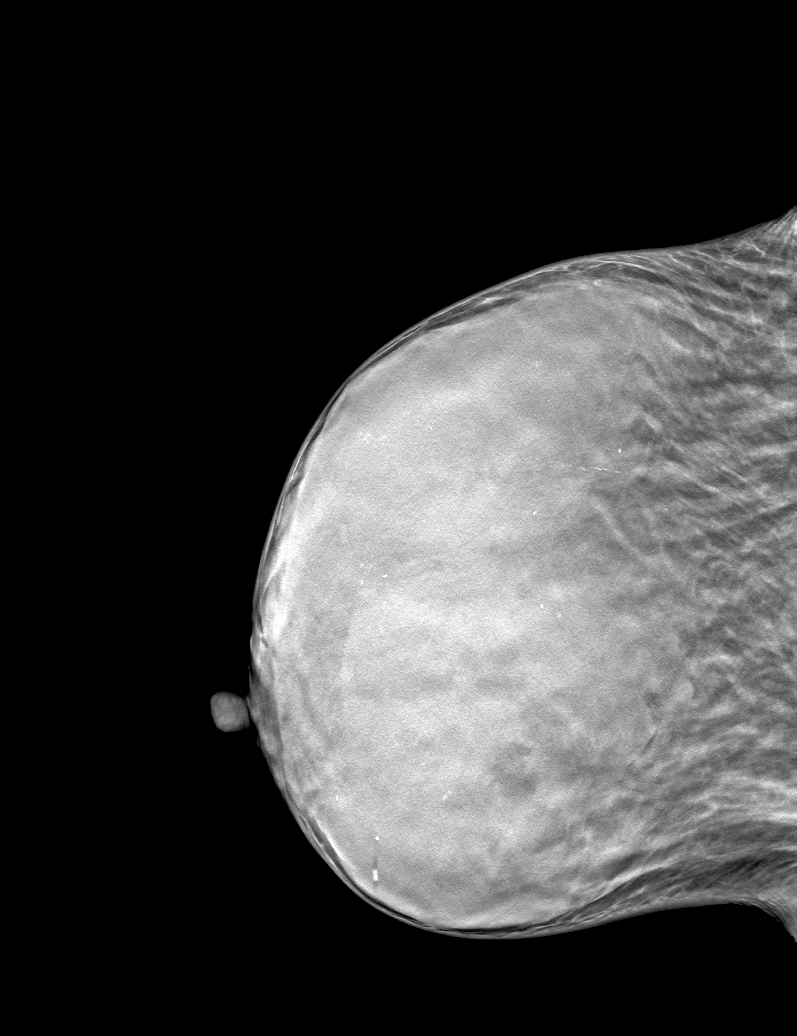

[6 of 30 positions shown; findings below may reference images not displayed]

ACR Breast Density Category d: The breast tissue is extremely dense,
which lowers the sensitivity of mammography.
FINDINGS: The patient's breast tissue is extremely dense, more marked than her
prior mammogram due to her recent weight loss which markedly limits
evaluation. No suspicious calcifications, masses or areas of
distortion are seen in the bilateral breasts.

Ultrasound targeted to the right breast at 7 o'clock, 1 cm from the
nipple demonstrates an overall stable oval superficial mass
measuring 1.9 x 0.5 x 0.9 cm, previously 1.6 x 0.5 x 1.0 cm.,
Previously measuring 1.3 x 0.4 x 0.8 cm.
IMPRESSION: 1. The right breast mass at 7 o'clock has demonstrated greater than
2 years of stability, and is therefore benign.

2.  No mammographic evidence of malignancy in the bilateral breasts.

3. Marked increase in breast tissue density consistent with the
patient's marked weight loss.

RECOMMENDATION:
1. I encouraged the patient follow-up with her doctor regarding the
unexplained weight loss. This may be related to her being ill
earlier in the year, however undetected malignancy may cause the
same problem. If there is any concern about occult malignancy,
specifically of breast origin, than bilateral breast MRI would be
recommended in this patient with extreme breast tissue density.

2.  Screening mammogram in one year.(Code:[Z4]).

I have discussed the findings and recommendations with the patient.
If applicable, a reminder letter will be sent to the patient
regarding the next appointment.

BI-RADS CATEGORY  2: Benign.

## 2021-06-27 IMAGING — US US BREAST*R* LIMITED INC AXILLA
1 series · 8 of 8 positions shown · non-contrast
Comparison: Previous exam(s).

CLINICAL DATA: 70-year-old female presenting for delayed follow-up
of a right breast mass. The patient was last seen in [DATE]. She has lost 60 pounds since she was hospitalized in [DATE]. The patient states her appetite is normal, and she is unsure
why she is losing weight.

EXAM:
DIGITAL DIAGNOSTIC BILATERAL MAMMOGRAM WITH TOMOSYNTHESIS AND CAD;
ULTRASOUND RIGHT BREAST LIMITED
TECHNIQUE: Bilateral digital diagnostic mammography and breast tomosynthesis
was performed. The images were evaluated with computer-aided
detection.; Targeted ultrasound examination of the right breast was
performed

[Series 1: us breast*right* limited inc axilla · 0.06mm/px · 8 of 8 slices shown]
[im 1/8]
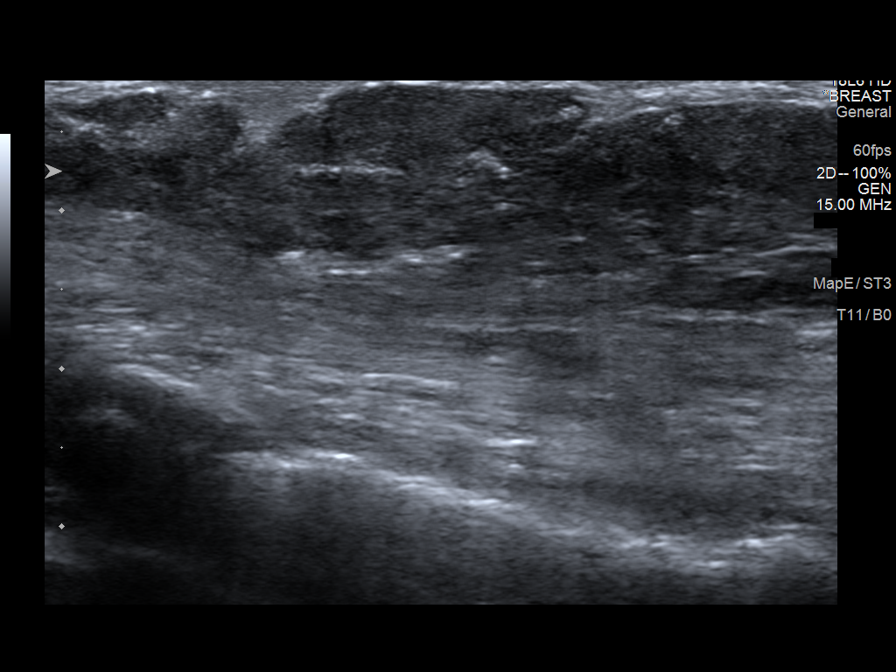
[im 2/8]
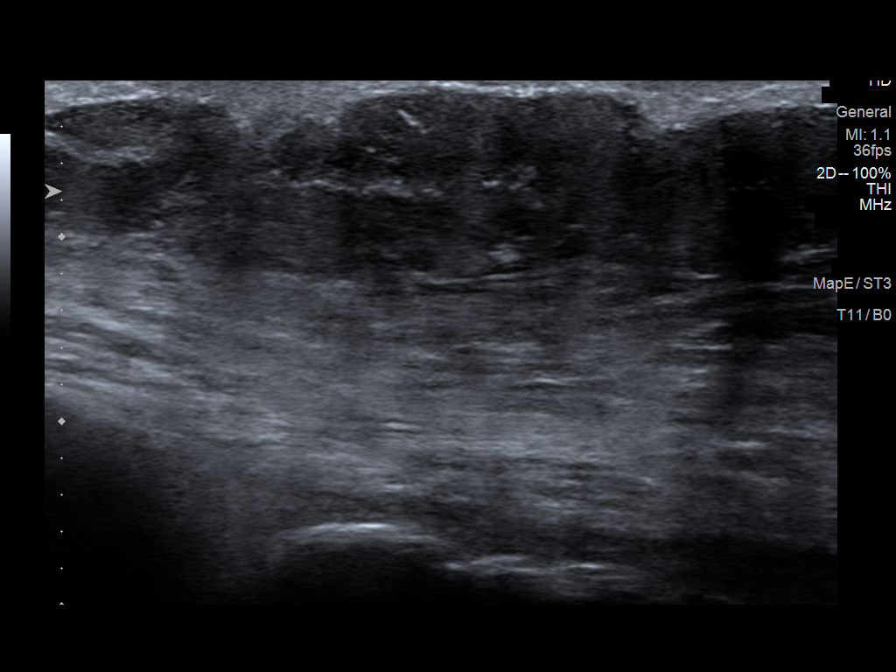
[im 3/8]
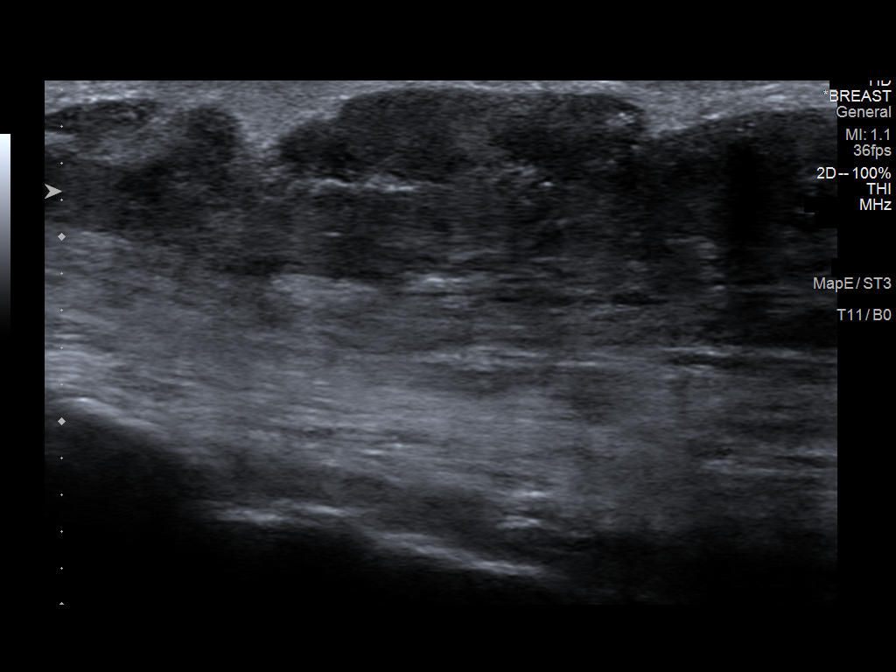
[im 4/8]
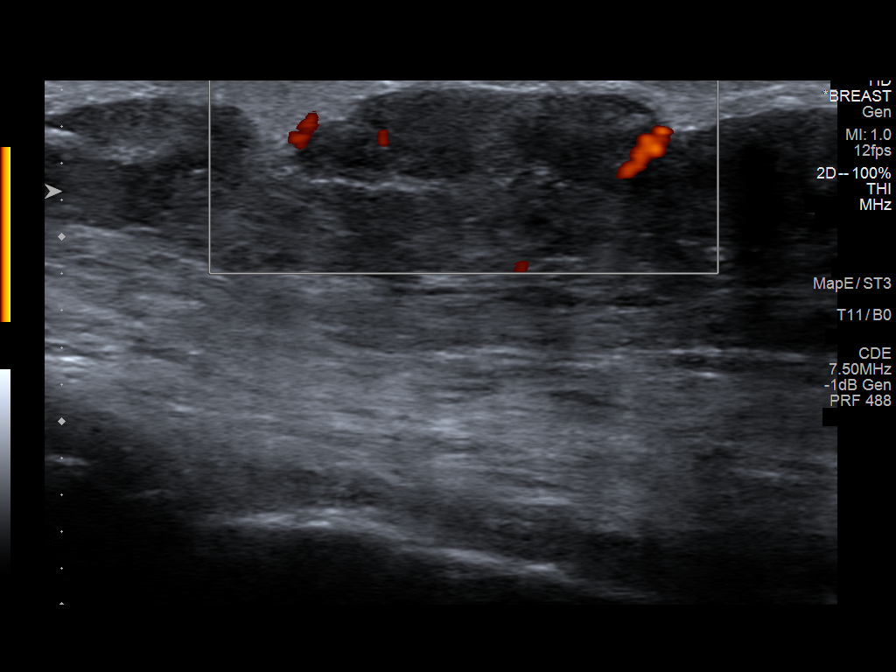
[im 5/8]
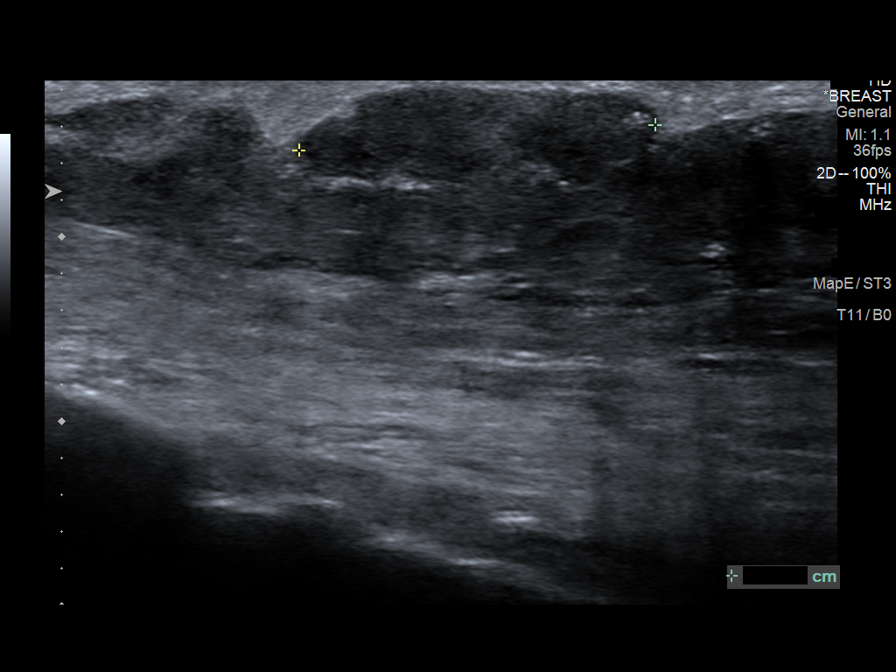
[im 6/8]
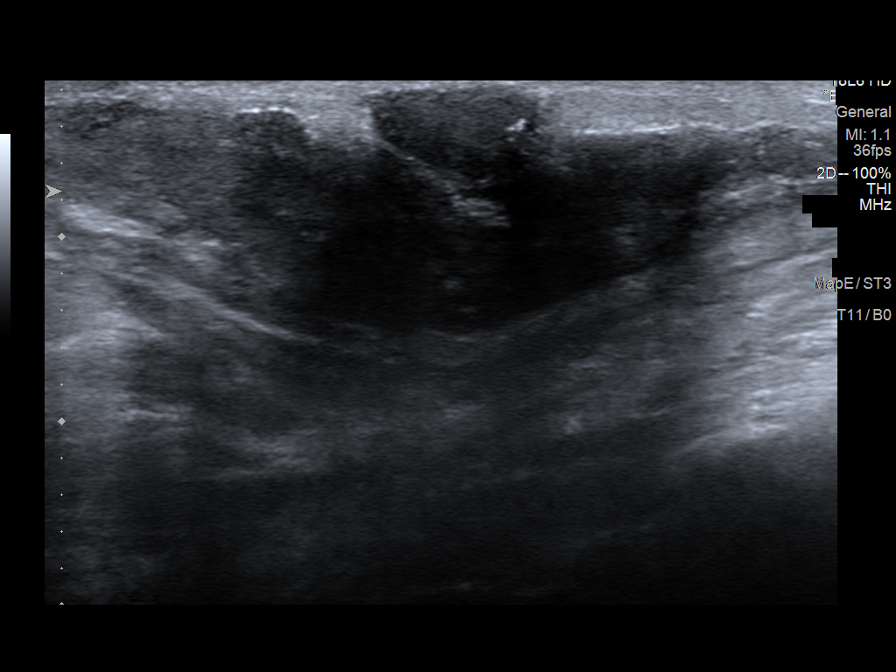
[im 7/8]
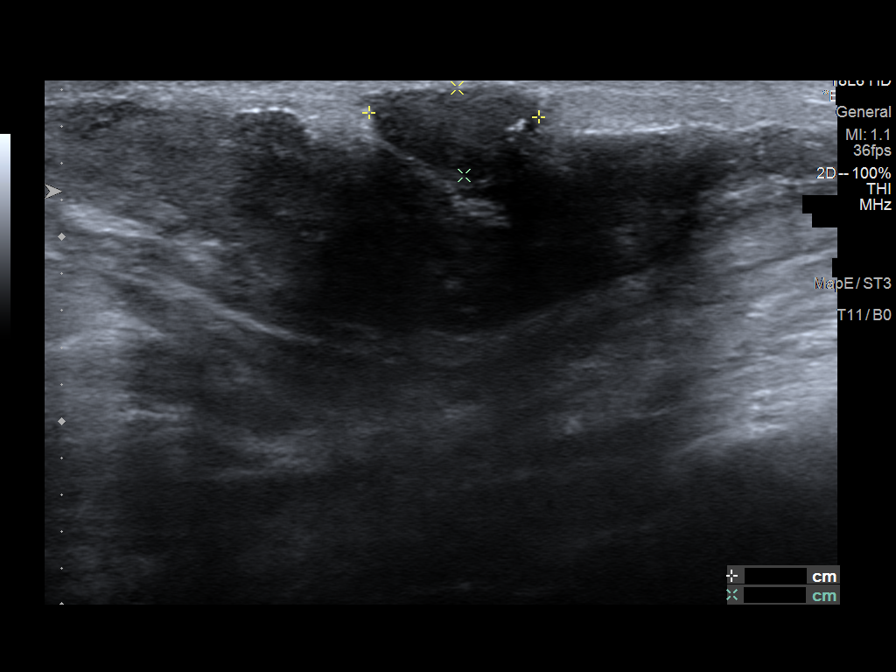
[im 8/8]
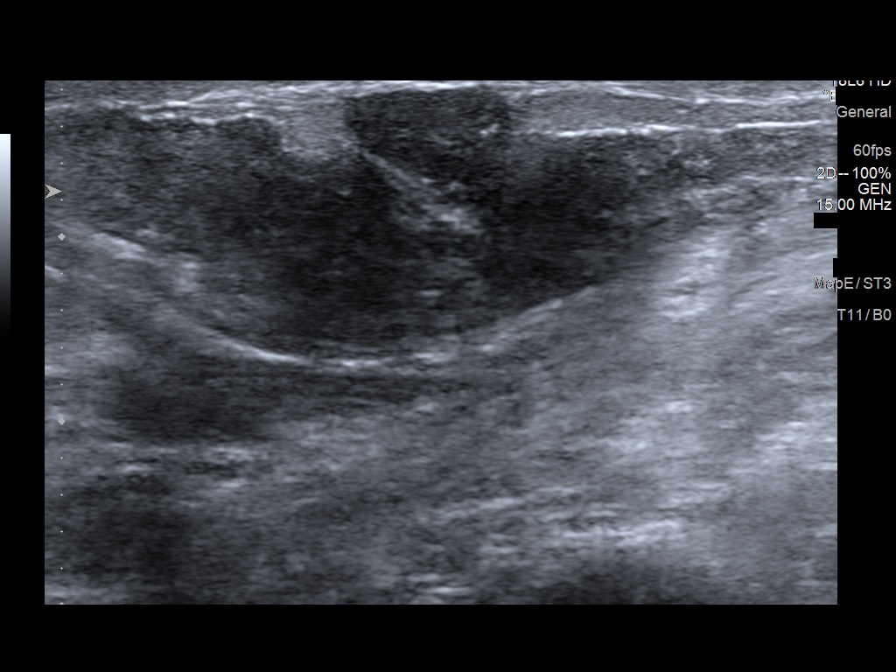

[8 of 8 positions shown; findings below may reference images not displayed]

ACR Breast Density Category d: The breast tissue is extremely dense,
which lowers the sensitivity of mammography.
FINDINGS: The patient's breast tissue is extremely dense, more marked than her
prior mammogram due to her recent weight loss which markedly limits
evaluation. No suspicious calcifications, masses or areas of
distortion are seen in the bilateral breasts.

Ultrasound targeted to the right breast at 7 o'clock, 1 cm from the
nipple demonstrates an overall stable oval superficial mass
measuring 1.9 x 0.5 x 0.9 cm, previously 1.6 x 0.5 x 1.0 cm.,
Previously measuring 1.3 x 0.4 x 0.8 cm.
IMPRESSION: 1. The right breast mass at 7 o'clock has demonstrated greater than
2 years of stability, and is therefore benign.

2.  No mammographic evidence of malignancy in the bilateral breasts.

3. Marked increase in breast tissue density consistent with the
patient's marked weight loss.

RECOMMENDATION:
1. I encouraged the patient follow-up with her doctor regarding the
unexplained weight loss. This may be related to her being ill
earlier in the year, however undetected malignancy may cause the
same problem. If there is any concern about occult malignancy,
specifically of breast origin, than bilateral breast MRI would be
recommended in this patient with extreme breast tissue density.

2.  Screening mammogram in one year.(Code:[Z4]).

I have discussed the findings and recommendations with the patient.
If applicable, a reminder letter will be sent to the patient
regarding the next appointment.

BI-RADS CATEGORY  2: Benign.

## 2021-06-29 ENCOUNTER — Other Ambulatory Visit: Payer: Self-pay

## 2021-06-29 DIAGNOSIS — I480 Paroxysmal atrial fibrillation: Secondary | ICD-10-CM

## 2021-06-29 MED ORDER — METOPROLOL SUCCINATE ER 50 MG PO TB24: 50.0000 mg | ORAL_TABLET | Freq: Every day | ORAL | 0 refills | Status: DC

## 2021-06-30 ENCOUNTER — Ambulatory Visit: Payer: Medicare Other | Admitting: Cardiology

## 2021-07-05 ENCOUNTER — Encounter: Payer: Medicare Other | Admitting: Gastroenterology

## 2021-07-06 ENCOUNTER — Ambulatory Visit (INDEPENDENT_AMBULATORY_CARE_PROVIDER_SITE_OTHER): Payer: Medicare Other | Admitting: Gastroenterology

## 2021-07-06 ENCOUNTER — Other Ambulatory Visit (INDEPENDENT_AMBULATORY_CARE_PROVIDER_SITE_OTHER): Payer: Medicare Other

## 2021-07-06 ENCOUNTER — Encounter: Payer: Self-pay | Admitting: Gastroenterology

## 2021-07-06 VITALS — BP 142/70 | HR 53 | Ht 63.0 in | Wt 124.0 lb

## 2021-07-06 DIAGNOSIS — Z8719 Personal history of other diseases of the digestive system: Secondary | ICD-10-CM | POA: Diagnosis not present

## 2021-07-06 DIAGNOSIS — K7460 Unspecified cirrhosis of liver: Secondary | ICD-10-CM

## 2021-07-06 LAB — IBC + FERRITIN
Ferritin: 62.1 ng/mL (ref 10.0–291.0)
Iron: 78 ug/dL (ref 42–145)
Saturation Ratios: 21.8 % (ref 20.0–50.0)
TIBC: 357 ug/dL (ref 250.0–450.0)
Transferrin: 255 mg/dL (ref 212.0–360.0)

## 2021-07-06 MED ORDER — PANTOPRAZOLE SODIUM 40 MG PO TBEC: 40.0000 mg | DELAYED_RELEASE_TABLET | Freq: Every day | ORAL | 11 refills | Status: DC

## 2021-07-06 NOTE — Progress Notes (Signed)
° ° °  History of Present Illness: This is a 71 year old female with cirrhosis.  She was evaluated by Dr. Silverio Decamp during hospitalization in January 2020 for an UGI bleed.  Multiple duodenal ulcers and gastritis were found felt secondary to NSAIDs.  She had previously been followed by Dr. Deatra Ina. She had seen me and Dr. Ardis Hughs in the past during hospitalizations. During a hospitalization in April 2022 two CT AP were performed imaging showed cirrhosis with diffuse fatty infiltration of the liver parenchyma and portal hypertension.  She states she was drinking 1-2 bottles of wine a day for over a year and prior to that she did not drink heavily.  She discontinued alcohol use in April 2022.   CT AP 4/16/ 2022 1. Cirrhosis with portal hypertension. No definite focal hepatic lesion; however limited evaluation on this single phase contrast study. Recommend MRI liver protocol for further evaluation. 2. Trace volume right pleural effusion. 3. Possible tiny hiatal hernia. 4. Interval development of T10, T12, L1 compression fractures with greater than 90% height loss. Correlate with point tenderness to palpation to evaluate for acute component. 5. Aortic Atherosclerosis (ICD10-I70.0) including mitral annular calcifications.     Current Medications, Allergies, Past Medical History, Past Surgical History, Family History and Social History were reviewed in [employer] record.   Physical Exam: General: Well developed, well nourished, no acute distress Head: Normocephalic and atraumatic Eyes: Sclerae anicteric, EOMI Ears: Normal auditory acuity Mouth: Not examined, mask on during Covid-19 pandemic Lungs: Clear throughout to auscultation Heart: Regular rate and rhythm; no murmurs, rubs or bruits Abdomen: Soft, non tender and non distended. No masses, hepatosplenomegaly or hernias noted. Normal Bowel sounds Rectal: deferred to colonoscopy  Musculoskeletal: Symmetrical with no gross  deformities  Pulses:  Normal pulses noted Extremities: No clubbing, cyanosis, edema or deformities noted Neurological: Alert oriented x 4, grossly nonfocal Psychological:  Alert and cooperative. Normal mood and affect   Assessment and Recommendations:  Compensated cirrhosis, history of alcohol abuse.  Rule out NASH, alcohol and other etiologies.  No alcohol since April 2022.  Send standard hepatic serologies and PT/INR. Schedule EGD for surveillance of varices. The risks (including bleeding, perforation, infection, missed lesions, medication reactions and possible hospitalization or surgery if complications occur), benefits, and alternatives to endoscopy with possible biopsy and possible dilation were discussed with the patient and they consent to proceed.   Duodenal ulcers with bleed.  Continue pantoprazole 40 mg daily long-term. History of choledocholithiasis post ERCP in 2017. History of pancreatitis.  S/P cholecystectomy

## 2021-07-06 NOTE — Patient Instructions (Signed)
Your provider has requested that you go to the basement level for lab work before leaving today. Press "B" on the elevator. The lab is located at the first door on the left as you exit the elevator.  We have sent the following medications to your pharmacy for you to pick up at your convenience: pantoprazole.   It has been recommended to you by your physician that you have a(n) Upper Endoscopy/colonoscopy completed. Per your request, we did not schedule the procedure(s) today. Please contact our office at [phone] should you decide to have the procedure completed. You will be scheduled for a pre-visit and procedure at that time.  The [facility] providers would like to encourage you to use [facility] to communicate with providers for non-urgent requests or questions.  Due to long hold times on the telephone, sending your provider a message by [facility] may be a faster and more efficient way to get a response.  Please allow 48 business hours for a response.  Please remember that this is for non-urgent requests.   Due to recent changes in healthcare laws, you may see the results of your imaging and laboratory studies on MyChart before your provider has had a chance to review them.  We understand that in some cases there may be results that are confusing or concerning to you. Not all laboratory results come back in the same time frame and the provider may be waiting for multiple results in order to interpret others.  Please give [place] 48 hours in order for your provider to thoroughly review all the results before contacting the office for clarification of your results.   Thank you for choosing me and [facility].  Pricilla Riffle. Dagoberto Ligas., MD., [facility]al

## 2021-07-06 NOTE — Addendum Note (Signed)
Addended by: Dorisann Frames L on: 07/06/2021 03:23 PM   Modules accepted: Orders

## 2021-07-12 LAB — ANTI-SMOOTH MUSCLE ANTIBODY, IGG: Actin (Smooth Muscle) Antibody (IGG): <20 U (ref <20)

## 2021-07-12 LAB — IGA: Immunoglobulin A: 137 mg/dL (ref 70–320)

## 2021-07-12 LAB — HEPATITIS B SURFACE ANTIGEN: Hepatitis B Surface Ag: NONREACTIVE

## 2021-07-12 LAB — ANGIOTENSIN CONVERTING ENZYME: Angiotensin-Converting Enzyme: <5 U/L — ABNORMAL LOW (ref 9–67)

## 2021-07-12 LAB — HEPATITIS B SURFACE ANTIBODY,QUALITATIVE: Hep B S Ab: NONREACTIVE

## 2021-07-12 LAB — TISSUE TRANSGLUTAMINASE, IGA: (tTG) Ab, IgA: <1 U/mL

## 2021-07-12 LAB — MITOCHONDRIAL ANTIBODIES: Mitochondrial M2 Ab, IgG: <=20 U (ref <=20.0)

## 2021-07-12 LAB — ANA: Anti Nuclear Antibody (ANA): POSITIVE — AB

## 2021-07-12 LAB — ALPHA-1-ANTITRYPSIN: A-1 Antitrypsin, Ser: 153 mg/dL (ref 83–199)

## 2021-07-12 LAB — CERULOPLASMIN: Ceruloplasmin: 24 mg/dL (ref 18–53)

## 2021-07-12 LAB — HEPATITIS C ANTIBODY
Hepatitis C Ab: NONREACTIVE
SIGNAL TO CUT-OFF: 0.31 (ref <1.00)

## 2021-07-12 LAB — ANTI-NUCLEAR AB-TITER (ANA TITER): ANA Titer 1: 1:320 {titer} — ABNORMAL HIGH

## 2021-07-14 ENCOUNTER — Other Ambulatory Visit (INDEPENDENT_AMBULATORY_CARE_PROVIDER_SITE_OTHER): Payer: Medicare Other

## 2021-07-14 DIAGNOSIS — K7460 Unspecified cirrhosis of liver: Secondary | ICD-10-CM | POA: Diagnosis not present

## 2021-07-14 DIAGNOSIS — Z8719 Personal history of other diseases of the digestive system: Secondary | ICD-10-CM

## 2021-07-14 LAB — PROTIME-INR
INR: 1.1 ratio — ABNORMAL HIGH (ref 0.8–1.0)
Prothrombin Time: 12.4 s (ref 9.6–13.1)

## 2021-07-25 ENCOUNTER — Encounter: Payer: Self-pay | Admitting: Cardiology

## 2021-07-25 ENCOUNTER — Ambulatory Visit: Payer: Medicare Other | Admitting: Cardiology

## 2021-07-25 ENCOUNTER — Other Ambulatory Visit: Payer: Self-pay

## 2021-07-25 VITALS — BP 158/78 | HR 46 | Temp 98.0°F | Resp 16 | Ht 63.0 in | Wt 118.0 lb

## 2021-07-25 DIAGNOSIS — I480 Paroxysmal atrial fibrillation: Secondary | ICD-10-CM

## 2021-07-25 DIAGNOSIS — I10 Essential (primary) hypertension: Secondary | ICD-10-CM

## 2021-07-25 DIAGNOSIS — I725 Aneurysm of other precerebral arteries: Secondary | ICD-10-CM

## 2021-07-25 DIAGNOSIS — K7460 Unspecified cirrhosis of liver: Secondary | ICD-10-CM

## 2021-07-25 DIAGNOSIS — F17200 Nicotine dependence, unspecified, uncomplicated: Secondary | ICD-10-CM

## 2021-07-25 NOTE — Progress Notes (Signed)
Date:  07/25/2021   ID:  [name], DOB [birth year], MRN [id number]  PCP:  [name], MD  Cardiologist:  Rex Kras, DO, [facility] (established care 02/09/2021) Former Cardiology Providers: Dr. Margaretann Loveless, Dr. Debara Pickett  Date: 07/25/21 Last Office Visit: 03/23/2021  Chief Complaint  Patient presents with   Atrial Fibrillation   Follow-up    HPI  [name] is a 71 y.o. female who presents to the office with a chief complaint of " atrial fibrillation management. " Patient's past medical history and cardiovascular risk factors include: Atrial fibrillation paroxysmal, active tobacco use, history of alcohol abuse, liver cirrhosis with portal hypertension, terminal basilar artery aneurysm, hypertension, postmenopausal female, advanced age.  She is referred to the office at the request of [name]* for evaluation of atrial fibrillation management.  Patient is being followed by the practice for management of atrial fibrillation.  She has been evaluated by [facility] heart care in the past during her hospitalization and given her history of GI bleed/cirrhosis with portal hypertension and terminal basilar artery aneurysm she was deemed a poor candidate for anticoagulation to reduce her risk of thromboembolic events given his CHA2DS2-VASc score and paroxysmal atrial fibrillation.  However, patient is very motivated/inclined to prevent her risk of thromboembolic event.  In the past we have discussed watchman (left atrial appendage closure device) as an alternative.  Requested that she be referred to [facility] for further evaluation and management patient prefers [facility].  Referral was placed at the last office visit but this still has not been followed through.  Patient has been seen by GI and plans to undergo endoscopy given her history of GI bleed, cirrhosis, and portal hypertension.  She will also need both GI and neurology clearance prior to considering oral  anticoagulation given her other chronic comorbid conditions as dicussed above.  HPI was obtained and discussed with both the patient and her daughter [name] at today's visit.  FUNCTIONAL STATUS: No structured exercise program or daily routine.   ALLERGIES: No Known Allergies  MEDICATION LIST PRIOR TO VISIT: Current Meds  Medication Sig   albuterol (VENTOLIN HFA) 108 (90 Base) MCG/ACT inhaler SMARTSIG:2 Puff(s) By Mouth Every 4 Hours PRN   B Complex Vitamins (B COMPLEX 1 PO) Take 1 tablet by mouth daily.   calcium carbonate (OSCAL) 1500 (600 Ca) MG TABS tablet Take 600 mg of elemental calcium by mouth daily with breakfast.   citalopram (CELEXA) 20 MG tablet TAKE 1 TABLET BY MOUTH EVERY DAY. (Patient taking differently: Take 20 mg by mouth daily.)   diclofenac Sodium (VOLTAREN) 1 % GEL Apply 2 g topically 4 (four) times daily as needed (pain).   Flaxseed, Linseed, (FLAX SEED OIL PO) Take 1 tablet by mouth daily.   folic acid (FOLVITE) 270 MCG tablet Take 400 mcg by mouth daily.   gabapentin (NEURONTIN) 100 MG capsule Take by mouth.   hydrALAZINE (APRESOLINE) 25 MG tablet Take 3 tablets (75 mg total) by mouth 3 (three) times daily.   ipratropium-albuterol (DUONEB) 0.5-2.5 (3) MG/3ML SOLN Take 3 mLs by nebulization every 6 (six) hours as needed.   levothyroxine (SYNTHROID) 100 MCG tablet Take 1 tablet (100 mcg total) by mouth daily at 6 (six) AM. Please follow up repeat labs in 4-6 weeks with PCP.   lisinopril (ZESTRIL) 40 MG tablet Take 1 tablet by mouth daily.   metoprolol succinate (TOPROL-XL) 50 MG 24 hr tablet Take 1 tablet (50 mg total) by mouth daily. Take with or immediately  following a meal.   Multiple Vitamin (MULTIVITAMIN WITH MINERALS) TABS tablet Take 1 tablet by mouth daily.   multivitamin-lutein (OCUVITE-LUTEIN) CAPS capsule Take 1 capsule by mouth daily.   omega-3 acid ethyl esters (LOVAZA) 1 g capsule Take 1 capsule (1 g total) by mouth daily.   pantoprazole (PROTONIX) 40 MG  tablet Take 1 tablet (40 mg total) by mouth at bedtime.   VITAMIN E PO Take 1 tablet by mouth 3 (three) times a week.     PAST MEDICAL HISTORY: Past Medical History:  Diagnosis Date   ACROCHORDON 03/16/2010   Arthritis    Chronic kidney disease    III   COPD (chronic obstructive pulmonary disease) (HCC)    GI bleeding 06/2018   Heart murmur    Hypertension    Hypothyroidism    Liver cirrhosis ([place])    Multiple duodenal ulcers 07/16/2018   associated with melena, anemia, gastritis. due to NSAID.  hx gastric ulcers and bleeding 2004, 2007   Thyroid disease    S/p Radiation    PAST SURGICAL HISTORY: Past Surgical History:  Procedure Laterality Date   BIOPSY  07/16/2018   Procedure: BIOPSY;  Surgeon: Mauri Pole, MD;  Location: [facility];  Service: Endoscopy;;   BREAST EXCISIONAL BIOPSY Left    benign   CHOLECYSTECTOMY N/A 12/07/2015   Procedure: LAPAROSCOPIC CHOLECYSTECTOMY WITH INTRAOPERATIVE CHOLANGIOGRAM;  Surgeon: Alphonsa Overall, MD;  Location: [facility];  Service: General;  Laterality: N/A;   COLONOSCOPY     ERCP N/A 12/06/2015   Procedure: ENDOSCOPIC RETROGRADE CHOLANGIOPANCREATOGRAPHY (ERCP);  Surgeon: Milus Banister, MD;  Location: [facility];  Service: Endoscopy;  Laterality: N/A;   ESOPHAGOGASTRODUODENOSCOPY (EGD) WITH PROPOFOL N/A 07/16/2018   Procedure: ESOPHAGOGASTRODUODENOSCOPY (EGD) WITH PROPOFOL;  Surgeon: Mauri Pole, MD;  Location: [facility];  Service: Endoscopy;  Laterality: N/A;   IM NAILING TIBIA Right 07/19/2012   DR Maureen Ralphs   IR VERTEBROPLASTY CERV/THOR BX INC UNI/BIL INC/INJECT/IMAGING  10/17/2020   IR VERTEBROPLASTY EA ADDL (T&LS) BX INC UNI/BIL INC INJECT/IMAGING  10/17/2020   SHOULDER HEMI-ARTHROPLASTY Right 07/22/2013   Procedure: RIGHT SHOULDER HEMI-ARTHROPLASTY;  Surgeon: Augustin Schooling, MD;  Location: [facility];  Service: Orthopedics;  Laterality: Right;   TIBIA IM NAIL INSERTION Right 07/19/2013   Procedure: INTRAMEDULLARY (IM)  NAIL TIBIAL;  Surgeon: Gearlean Alf, MD;  Location: [facility];  Service: Orthopedics;  Laterality: Right;   TUBAL LIGATION     UPPER GASTROINTESTINAL ENDOSCOPY      FAMILY HISTORY: The patient family history includes Colon polyps in her brother; Healthy in her daughter and daughter; Heart disease in her brother, brother, and mother; Lung cancer in her father; Psoriasis in her brother.  SOCIAL HISTORY:  The patient  reports that she has been smoking cigarettes. She has a 40.00 pack-year smoking history. She has never used smokeless tobacco. She reports that she does not drink alcohol and does not use drugs.  REVIEW OF SYSTEMS: Review of Systems  Cardiovascular:  Negative for chest pain, dyspnea on exertion, leg swelling, palpitations and syncope.  Respiratory:  Negative for cough, shortness of breath and sputum production.    PHYSICAL EXAM: Vitals with BMI 07/25/2021 07/25/2021 07/06/2021  Height - 5\' 3"  5\' 3"   Weight - 118 lbs 124 lbs  BMI - 86.76 72.09  Systolic 470 962 836  Diastolic 78 80 70  Pulse - 46 53   CONSTITUTIONAL: Well-developed and well-nourished. No acute distress.  SKIN: Skin is warm and dry. No rash noted.  No cyanosis. No pallor. No jaundice HEAD: Normocephalic and atraumatic.  EYES: No scleral icterus MOUTH/THROAT: Moist oral membranes.  NECK: No JVD present. No thyromegaly noted. No carotid bruits  LYMPHATIC: No visible cervical adenopathy.  CHEST Normal respiratory effort. No intercostal retractions  LUNGS: Clear to auscultation bilaterally.  No stridor. No wheezes. No rales.  CARDIOVASCULAR: Regular, bradycardic, H4-T6  soft holosystolic murmur heard at the apex, no gallops or rubs. ABDOMINAL: No apparent ascites. EXTREMITIES: right leg trace peripheral edema, swelling LLE HEMATOLOGIC: No significant bruising NEUROLOGIC: Oriented to person, place, and time. Nonfocal. Normal muscle tone.  PSYCHIATRIC: Normal mood and affect. Normal behavior. Cooperative No  change in physical examination since last office encounter  CARDIAC DATABASE: EKG: 02/09/2021: Atrial fibrillation, 117 bpm, old anteroseptal infarct, nonspecific T wave abnormality, premature ventricular contractions.   03/23/2021: Normal sinus rhythm, 75 bpm, with premature ventricular contractions, consider old anteroseptal infarct, without underlying ischemia or injury pattern.  Echocardiogram: 10/10/2020: LVEF 55-60%, no regional wall motion abnormalities, moderate asymmetric hypertrophy of the basal septal segment, mild LVH, grade 1 diastolic impairment, elevated left atrial pressure, PASP 60.8 mmHg consistent with pulmonary hypertension, severely dilated left atrium, mildly dilated right atrium, mild MR, mild to moderate aortic valve sclerosis without stenosis.  Stress Testing: Lexiscan Tetrofosmin stress test 03/13/2021: 1 Day Rest/Stress Protocol. Stress EKG is non-diagnostic, as this is pharmacological stress test using Lexiscan. PVCS noted during rest, stress, and recovery. SPECT images notes small size, mildly reduced tracer uptake within the mid anterolateral segment, more pronounced on stress images.  This is likely due to increased gastric uptake on stress images compared to rest but ischemia cannot be excluded. Otherwise, no convincing evidence of reversible myocardial ischemia or prior infarct. Calculated LVEF 44%, visually appears preserved.  Low risk study, clinical correlation requested  Heart Catheterization: None  LABORATORY DATA: CBC Latest Ref Rng & Units 10/26/2020 10/23/2020 10/22/2020  WBC 4.0 - 10.5 K/uL 8.2 9.8 10.4  Hemoglobin 12.0 - 15.0 g/dL 11.3(L) 12.4 12.6  Hematocrit 36.0 - 46.0 % 30.1(L) 33.8(L) 34.0(L)  Platelets 150 - 400 K/uL 209 196 216    CMP Latest Ref Rng & Units 10/27/2020 10/26/2020 10/25/2020  Glucose 70 - 99 mg/dL 97 91 89  BUN 8 - 23 mg/dL 10 11 13   Creatinine 0.44 - 1.00 mg/dL 0.44 0.44 0.48  Sodium 135 - 145 mmol/L 134(L) 131(L) 133(L)  Potassium  3.5 - 5.1 mmol/L 4.0 3.0(L) 3.5  Chloride 98 - 111 mmol/L 101 98 97(L)  CO2 22 - 32 mmol/L 27 25 27   Calcium 8.9 - 10.3 mg/dL 9.0 8.6(L) 8.7(L)  Total Protein 6.5 - 8.1 g/dL - - -  Total Bilirubin 0.3 - 1.2 mg/dL - - -  Alkaline Phos 38 - 126 U/L - - -  AST 15 - 41 U/L - - -  ALT 0 - 44 U/L - - -    Lipid Panel     Component Value Date/Time   CHOL 139 07/09/2018 1114   TRIG 75 07/09/2018 1114   HDL 58 07/09/2018 1114   CHOLHDL 2.4 07/09/2018 1114   VLDL 26 12/06/2015 0426   LDLCALC 65 07/09/2018 1114    No components found for: NTPROBNP No results for input(s): PROBNP in the last 8760 hours. Recent Labs    10/12/20 0802 10/22/20 2255 10/23/20 0510  TSH 0.241* 1.340 1.164    BMP Recent Labs    10/25/20 0926 10/26/20 0317 10/27/20 0337  NA 133* 131* 134*  K 3.5 3.0* 4.0  CL 97* 98 101  CO2 27 25 27   GLUCOSE 89 91 97  BUN 13 11 10   CREATININE 0.48 0.44 0.44  CALCIUM 8.7* 8.6* 9.0  GFRNONAA >60 >60 >60    HEMOGLOBIN A1C Lab Results  Component Value Date   HGBA1C 4.7 (L) 10/12/2020   MPG 88.19 10/12/2020    IMPRESSION:    ICD-10-CM   1. Paroxysmal A-fib (HCC)  I48.0     2. Essential hypertension  I10     3. Basilar artery aneurysm (HCC)  I72.5     4. Hepatic cirrhosis, unspecified hepatic cirrhosis type, unspecified whether ascites present ([place])  K74.60     5. Tobacco use disorder  F17.200         RECOMMENDATIONS: [name] is a 71 y.o. female whose past medical history and cardiac risk factors include: Paroxysmal atrial fibrillation, active tobacco use, history of alcohol abuse, liver cirrhosis with portal hypertension, hypertension, postmenopausal female, advanced age.  Paroxysmal A-fib [facility]) Per EMR diagnosed in April 2022. Rate control: Toprol-XL. Rhythm control: N/A. Thromboembolic prophylaxis: N/A CHA2DS2-VASc SCORE is 3 which correlates to 3.2% risk of stroke per year. During her prior hospitalization in April 2022 she was  evaluated by multiple cardiologist during her stay who recommended against oral anticoagulation given her history of alcohol use, cirrhosis with portal hypertension, history of GI bleed and imaging findings of terminal basilar artery aneurysm. However, patient would like to consider reducing her risk of possible thromboembolic event.  Therefore recommended being evaluated for watchman device.  Patient is requesting a referral outside of [facility] network. Referral was placed for [facility] as per her wishes but evaluation is still pending. I have asked her to follow-up with GI and neurology to see if she would be considered a candidate for oral anticoagulation given her history of cirrhosis/portal hypertension and basilar artery aneurysms respectively.  She is currently in the process of being evaluated by GI with neurology still pending. Had a long discussion with both the patient and her daughter [name] at today's visit.  Essential hypertension Blood pressures are currently not at goal. Likely due to her recently taking her medications. Monitor for now.   Currently managed by primary care provider.  Tobacco use disorder Tobacco cessation counseling: Currently smoking 0.75 packs/day   Patient was informed of the dangers of tobacco abuse including stroke, cancer, and MI, as well as benefits of tobacco cessation. Patient is willing to quit at this time. 5 mins were spent counseling patient cessation techniques. We discussed various methods to help quit smoking, including deciding on a date to quit, joining a support group, pharmacological agents- nicotine gum/patch/lozenges, chantix.  I will reassess her progress at the next follow-up visit  Total time spent:30 minutes.  FINAL MEDICATION LIST END OF ENCOUNTER: No orders of the defined types were placed in this encounter.   There are no discontinued medications.    Current Outpatient Medications:    albuterol (VENTOLIN HFA) 108 (90 Base)  MCG/ACT inhaler, SMARTSIG:2 Puff(s) By Mouth Every 4 Hours PRN, Disp: , Rfl:    B Complex Vitamins (B COMPLEX 1 PO), Take 1 tablet by mouth daily., Disp: , Rfl:    calcium carbonate (OSCAL) 1500 (600 Ca) MG TABS tablet, Take 600 mg of elemental calcium by mouth daily with breakfast., Disp: , Rfl:    citalopram (CELEXA) 20 MG tablet, TAKE 1 TABLET BY MOUTH EVERY DAY. (Patient taking differently: Take 20 mg by mouth daily.), Disp: 90 tablet, Rfl: 0  diclofenac Sodium (VOLTAREN) 1 % GEL, Apply 2 g topically 4 (four) times daily as needed (pain)., Disp: 50 g, Rfl: 0   Flaxseed, Linseed, (FLAX SEED OIL PO), Take 1 tablet by mouth daily., Disp: , Rfl:    folic acid (FOLVITE) 272 MCG tablet, Take 400 mcg by mouth daily., Disp: , Rfl:    gabapentin (NEURONTIN) 100 MG capsule, Take by mouth., Disp: , Rfl:    hydrALAZINE (APRESOLINE) 25 MG tablet, Take 3 tablets (75 mg total) by mouth 3 (three) times daily., Disp: 270 tablet, Rfl: 0   ipratropium-albuterol (DUONEB) 0.5-2.5 (3) MG/3ML SOLN, Take 3 mLs by nebulization every 6 (six) hours as needed., Disp: 360 mL, Rfl:    levothyroxine (SYNTHROID) 100 MCG tablet, Take 1 tablet (100 mcg total) by mouth daily at 6 (six) AM. Please follow up repeat labs in 4-6 weeks with PCP., Disp: 30 tablet, Rfl: 0   lisinopril (ZESTRIL) 40 MG tablet, Take 1 tablet by mouth daily., Disp: , Rfl:    metoprolol succinate (TOPROL-XL) 50 MG 24 hr tablet, Take 1 tablet (50 mg total) by mouth daily. Take with or immediately following a meal., Disp: 30 tablet, Rfl: 0   Multiple Vitamin (MULTIVITAMIN WITH MINERALS) TABS tablet, Take 1 tablet by mouth daily., Disp: , Rfl:    multivitamin-lutein (OCUVITE-LUTEIN) CAPS capsule, Take 1 capsule by mouth daily., Disp: , Rfl:    omega-3 acid ethyl esters (LOVAZA) 1 g capsule, Take 1 capsule (1 g total) by mouth daily., Disp: 30 capsule, Rfl: 0   pantoprazole (PROTONIX) 40 MG tablet, Take 1 tablet (40 mg total) by mouth at bedtime., Disp: 30  tablet, Rfl: 11   VITAMIN E PO, Take 1 tablet by mouth 3 (three) times a week., Disp: , Rfl:   No orders of the defined types were placed in this encounter.   There are no Patient Instructions on file for this visit.   --Continue cardiac medications as reconciled in final medication list. --Return in about 6 months (around 01/22/2022) for Follow up, A. fib. Or sooner if needed. --Continue follow-up with your primary care physician regarding the management of your other chronic comorbid conditions.  Patient's questions and concerns were addressed to her satisfaction. She voices understanding of the instructions provided during this encounter.   This note was created using a voice recognition software as a result there may be grammatical errors inadvertently enclosed that do not reflect the nature of this encounter. Every attempt is made to correct such errors.  Rex Kras, [place], [facility]  Pager: [phone] Office: [phone]

## 2021-07-26 ENCOUNTER — Encounter: Payer: Self-pay | Admitting: Gastroenterology

## 2021-07-31 ENCOUNTER — Telehealth: Payer: Self-pay | Admitting: *Deleted

## 2021-07-31 NOTE — Telephone Encounter (Signed)
Added EGD with Colon 3-23 at 11 am - RS with PT today - Lelan Pons PV

## 2021-07-31 NOTE — Telephone Encounter (Signed)
Dr Fuller Plan,  This pt saw in in the office 07-06-2021- your note indicates she needs an EGD- she is scheduled for a colon 3-23 but not an upper. She saw her cardiologist 07-25-2021 for A FIb follow up and his note states she needs neurology clearance prior to her GI procedures.   Is it ok to add her EGD and do we need neuro clearance for her procedures?  Please advise- thanks  Lelan Pons PV

## 2021-07-31 NOTE — Telephone Encounter (Signed)
Please add EGD to her colonoscopy. Cardiology note indicates she will need both GI and Neurology clearance prior to considering oral anticoagulation. GI will determine bleeding risk of oral anticoagulants after colonoscopy and EGD.

## 2021-08-03 ENCOUNTER — Other Ambulatory Visit: Payer: Self-pay | Admitting: Cardiology

## 2021-08-03 DIAGNOSIS — I480 Paroxysmal atrial fibrillation: Secondary | ICD-10-CM

## 2021-08-17 ENCOUNTER — Other Ambulatory Visit: Payer: Self-pay | Admitting: Family Medicine

## 2021-08-17 DIAGNOSIS — K7460 Unspecified cirrhosis of liver: Secondary | ICD-10-CM

## 2021-08-17 DIAGNOSIS — R634 Abnormal weight loss: Secondary | ICD-10-CM

## 2021-08-17 DIAGNOSIS — J90 Pleural effusion, not elsewhere classified: Secondary | ICD-10-CM

## 2021-08-17 DIAGNOSIS — I272 Pulmonary hypertension, unspecified: Secondary | ICD-10-CM

## 2021-08-17 DIAGNOSIS — F1091 Alcohol use, unspecified, in remission: Secondary | ICD-10-CM

## 2021-08-17 DIAGNOSIS — F17200 Nicotine dependence, unspecified, uncomplicated: Secondary | ICD-10-CM

## 2021-08-31 ENCOUNTER — Other Ambulatory Visit: Payer: Self-pay

## 2021-08-31 ENCOUNTER — Ambulatory Visit (AMBULATORY_SURGERY_CENTER): Payer: Medicare Other | Admitting: *Deleted

## 2021-08-31 VITALS — Ht 63.0 in | Wt 118.0 lb

## 2021-08-31 DIAGNOSIS — K7460 Unspecified cirrhosis of liver: Secondary | ICD-10-CM

## 2021-08-31 DIAGNOSIS — Z8719 Personal history of other diseases of the digestive system: Secondary | ICD-10-CM

## 2021-08-31 DIAGNOSIS — Z1211 Encounter for screening for malignant neoplasm of colon: Secondary | ICD-10-CM

## 2021-08-31 NOTE — Progress Notes (Signed)
Patient's pre-visit was done today over the phone with the patient. Name,DOB and address verified. Patient denies any allergies to Eggs and Soy. Patient denies any problems with anesthesia/sedation. Patient is not taking any diet pills or blood thinners. No home Oxygen.  ? ?Prep instructions mailed to pt-pt aware. Patient understands to call us back with any questions or concerns. Patient is aware of our care-partner policy and [id number] safety protocol.  Patient has Golytely at home. Patient denies any medical chart hx changes since last GI OV. ?The patient is COVID-19 vaccinated.   ?

## 2021-09-06 ENCOUNTER — Encounter: Payer: Self-pay | Admitting: Gastroenterology

## 2021-09-12 ENCOUNTER — Inpatient Hospital Stay: Admission: RE | Admit: 2021-09-12 | Payer: Medicare Other | Source: Ambulatory Visit

## 2021-09-14 ENCOUNTER — Encounter: Payer: Self-pay | Admitting: Gastroenterology

## 2021-09-14 ENCOUNTER — Ambulatory Visit (AMBULATORY_SURGERY_CENTER): Payer: Medicare Other | Admitting: Gastroenterology

## 2021-09-14 ENCOUNTER — Encounter: Payer: Medicare Other | Admitting: Gastroenterology

## 2021-09-14 VITALS — BP 182/81 | HR 58 | Temp 99.1°F | Resp 11 | Ht 63.0 in | Wt 118.0 lb

## 2021-09-14 DIAGNOSIS — Z1211 Encounter for screening for malignant neoplasm of colon: Secondary | ICD-10-CM | POA: Diagnosis not present

## 2021-09-14 DIAGNOSIS — D132 Benign neoplasm of duodenum: Secondary | ICD-10-CM

## 2021-09-14 DIAGNOSIS — Z1212 Encounter for screening for malignant neoplasm of rectum: Secondary | ICD-10-CM

## 2021-09-14 DIAGNOSIS — D123 Benign neoplasm of transverse colon: Secondary | ICD-10-CM

## 2021-09-14 DIAGNOSIS — K7460 Unspecified cirrhosis of liver: Secondary | ICD-10-CM | POA: Diagnosis not present

## 2021-09-14 MED ORDER — SODIUM CHLORIDE 0.9 % IV SOLN: 500.0000 mL | Freq: Once | INTRAVENOUS | Status: DC

## 2021-09-14 NOTE — Care Management Note (Signed)
To Pacu, VSS. Report to [email or web address] 

## 2021-09-14 NOTE — Progress Notes (Signed)
History & Physical  Primary Care Physician:  Margot Ables, MD Primary Gastroenterologist: Claudette Head, MD  CHIEF COMPLAINT:  CRC screening, cirrhosis screen for varices  HPI: [name] is a 71 y.o. female her to screen for varices in setting of cirrhosis with EGD.  Average risk CRC screening for colonoscopy.   Past Medical History:  Diagnosis Date   ACROCHORDON 03/16/2010   Arthritis    Chronic kidney disease    III   COPD (chronic obstructive pulmonary disease) (HCC)    GI bleeding 06/2018   Heart murmur    Hypertension    Hypothyroidism    Liver cirrhosis (HCC)    Multiple duodenal ulcers 07/16/2018   associated with melena, anemia, gastritis. due to NSAID.  hx gastric ulcers and bleeding 2004, 2007   Thyroid disease    S/p Radiation    Past Surgical History:  Procedure Laterality Date   BIOPSY  07/16/2018   Procedure: BIOPSY;  Surgeon: Napoleon Form, MD;  Location: [facility];  Service: Endoscopy;;   BREAST EXCISIONAL BIOPSY Left    benign   CHOLECYSTECTOMY N/A 12/07/2015   Procedure: LAPAROSCOPIC CHOLECYSTECTOMY WITH INTRAOPERATIVE CHOLANGIOGRAM;  Surgeon: Ovidio Kin, MD;  Location: [facility];  Service: General;  Laterality: N/A;   COLONOSCOPY     ERCP N/A 12/06/2015   Procedure: ENDOSCOPIC RETROGRADE CHOLANGIOPANCREATOGRAPHY (ERCP);  Surgeon: Rachael Fee, MD;  Location: [facility];  Service: Endoscopy;  Laterality: N/A;   ESOPHAGOGASTRODUODENOSCOPY (EGD) WITH PROPOFOL N/A 07/16/2018   Procedure: ESOPHAGOGASTRODUODENOSCOPY (EGD) WITH PROPOFOL;  Surgeon: Napoleon Form, MD;  Location: [facility];  Service: Endoscopy;  Laterality: N/A;   IM NAILING TIBIA Right 07/19/2012   DR Despina Hick   IR VERTEBROPLASTY CERV/THOR BX INC UNI/BIL INC/INJECT/IMAGING  10/17/2020   IR VERTEBROPLASTY EA ADDL (T&LS) BX INC UNI/BIL INC INJECT/IMAGING  10/17/2020   SHOULDER HEMI-ARTHROPLASTY Right 07/22/2013   Procedure: RIGHT SHOULDER  HEMI-ARTHROPLASTY;  Surgeon: Verlee Rossetti, MD;  Location: [facility];  Service: Orthopedics;  Laterality: Right;   TIBIA IM NAIL INSERTION Right 07/19/2013   Procedure: INTRAMEDULLARY (IM) NAIL TIBIAL;  Surgeon: Loanne Drilling, MD;  Location: [facility] OR;  Service: Orthopedics;  Laterality: Right;   TUBAL LIGATION     UPPER GASTROINTESTINAL ENDOSCOPY      Prior to Admission medications   Medication Sig Start Date End Date Taking? Authorizing Provider  albuterol (VENTOLIN HFA) 108 (90 Base) MCG/ACT inhaler SMARTSIG:2 Puff(s) By Mouth Every 4 Hours PRN 06/15/21  Yes [provider]  B Complex Vitamins (B COMPLEX 1 PO) Take 1 tablet by mouth daily.   Yes [provider]  calcium carbonate (OSCAL) 1500 (600 Ca) MG TABS tablet Take 600 mg of elemental calcium by mouth daily with breakfast.   Yes [provider]  citalopram (CELEXA) 20 MG tablet TAKE 1 TABLET BY MOUTH EVERY DAY. Patient taking differently: Take 20 mg by mouth daily. 03/17/14  Yes Tyrone Nine, MD  diclofenac Sodium (VOLTAREN) 1 % GEL Apply 2 g topically 4 (four) times daily as needed (pain). 10/18/20  Yes Zigmund Daniel., MD  Flaxseed, Linseed, (FLAX SEED OIL PO) Take 1 tablet by mouth daily.   Yes [provider]  folic acid (FOLVITE) 800 MCG tablet Take 400 mcg by mouth daily.   Yes [provider]  gabapentin (NEURONTIN) 100 MG capsule Take by mouth. 02/21/21  Yes [provider]  hydrALAZINE (APRESOLINE) 25 MG tablet Take 3 tablets (75 mg total) by mouth 3 (  three) times daily. 10/18/20 08/24/23 Yes Zigmund Daniel., MD  levothyroxine (SYNTHROID) 112 MCG tablet Take 112 mcg by mouth daily. 08/18/21  Yes [provider]  lisinopril (ZESTRIL) 40 MG tablet Take 1 tablet by mouth daily. 09/26/20  Yes [provider]  metoprolol succinate (TOPROL-XL) 50 MG 24 hr tablet TAKE 1 TABLET BY MOUTH ONCE DAILY (TAKE  WITH  OR  IMMEDIATELY  FOLLOWING  A  MEAL) 08/03/21  Yes  Tolia, Sunit, DO  Multiple Vitamin (MULTIVITAMIN WITH MINERALS) TABS tablet Take 1 tablet by mouth daily.   Yes [provider]  multivitamin-lutein (OCUVITE-LUTEIN) CAPS capsule Take 1 capsule by mouth daily.   Yes [provider]  omega-3 acid ethyl esters (LOVAZA) 1 g capsule Take 1 capsule (1 g total) by mouth daily. 10/18/20 08/23/24 Yes Zigmund Daniel., MD  pantoprazole (PROTONIX) 40 MG tablet Take 1 tablet (40 mg total) by mouth at bedtime. 07/06/21 09/01/23 Yes Meryl Dare, MD  VITAMIN E PO Take 1 tablet by mouth 3 (three) times a week.   Yes [provider]  ipratropium-albuterol (DUONEB) 0.5-2.5 (3) MG/3ML SOLN Take 3 mLs by nebulization every 6 (six) hours as needed. 10/27/20   Burnadette Pop, MD    Current Outpatient Medications  Medication Sig Dispense Refill   albuterol (VENTOLIN HFA) 108 (90 Base) MCG/ACT inhaler SMARTSIG:2 Puff(s) By Mouth Every 4 Hours PRN     B Complex Vitamins (B COMPLEX 1 PO) Take 1 tablet by mouth daily.     calcium carbonate (OSCAL) 1500 (600 Ca) MG TABS tablet Take 600 mg of elemental calcium by mouth daily with breakfast.     citalopram (CELEXA) 20 MG tablet TAKE 1 TABLET BY MOUTH EVERY DAY. (Patient taking differently: Take 20 mg by mouth daily.) 90 tablet 0   diclofenac Sodium (VOLTAREN) 1 % GEL Apply 2 g topically 4 (four) times daily as needed (pain). 50 g 0   Flaxseed, Linseed, (FLAX SEED OIL PO) Take 1 tablet by mouth daily.     folic acid (FOLVITE) 800 MCG tablet Take 400 mcg by mouth daily.     gabapentin (NEURONTIN) 100 MG capsule Take by mouth.     hydrALAZINE (APRESOLINE) 25 MG tablet Take 3 tablets (75 mg total) by mouth 3 (three) times daily. 270 tablet 0   levothyroxine (SYNTHROID) 112 MCG tablet Take 112 mcg by mouth daily.     lisinopril (ZESTRIL) 40 MG tablet Take 1 tablet by mouth daily.     metoprolol succinate (TOPROL-XL) 50 MG 24 hr tablet TAKE 1 TABLET BY MOUTH ONCE DAILY (TAKE  WITH  OR  IMMEDIATELY   FOLLOWING  A  MEAL) 90 tablet 1   Multiple Vitamin (MULTIVITAMIN WITH MINERALS) TABS tablet Take 1 tablet by mouth daily.     multivitamin-lutein (OCUVITE-LUTEIN) CAPS capsule Take 1 capsule by mouth daily.     omega-3 acid ethyl esters (LOVAZA) 1 g capsule Take 1 capsule (1 g total) by mouth daily. 30 capsule 0   pantoprazole (PROTONIX) 40 MG tablet Take 1 tablet (40 mg total) by mouth at bedtime. 30 tablet 11   VITAMIN E PO Take 1 tablet by mouth 3 (three) times a week.     ipratropium-albuterol (DUONEB) 0.5-2.5 (3) MG/3ML SOLN Take 3 mLs by nebulization every 6 (six) hours as needed. 360 mL    Current Facility-Administered Medications  Medication Dose Route Frequency Provider Last Rate Last Admin   0.9 %  sodium chloride infusion  500  mL Intravenous Once Meryl Dare, MD        Allergies as of 09/14/2021   (No Known Allergies)    Family History  Problem Relation Age of Onset   Heart disease Mother    Lung cancer Father    Colon polyps Brother    Heart disease Brother    Psoriasis Brother    Heart disease Brother    Healthy Daughter    Healthy Daughter    Colon cancer Neg Hx    Esophageal cancer Neg Hx    Rectal cancer Neg Hx    Stomach cancer Neg Hx     Social History   Socioeconomic History   Marital status: Widowed    Spouse name: Not on file   Number of children: Not on file   Years of education: Not on file   Highest education level: Not on file  Occupational History   Not on file  Tobacco Use   Smoking status: Every Day    Packs/day: 1.00    Years: 40.00    Pack years: 40.00    Types: Cigarettes   Smokeless tobacco: Never  Vaping Use   Vaping Use: Never used  Substance and Sexual Activity   Alcohol use: No    Comment: stopped drinking 5 weeks ago/updated 07/09/2018   Drug use: No   Sexual activity: Not on file  Other Topics Concern   Not on file  Social History Narrative   Not on file   Social Determinants of Health   Financial Resource  Strain: Not on file  Food Insecurity: Not on file  Transportation Needs: Not on file  Physical Activity: Not on file  Stress: Not on file  Social Connections: Not on file  Intimate Partner Violence: Not on file    Review of Systems:  All systems reviewed an negative except where noted in HPI.  Gen: Denies any fever, chills, sweats, anorexia, fatigue, weakness, malaise, weight loss, and sleep disorder CV: Denies chest pain, angina, palpitations, syncope, orthopnea, PND, peripheral edema, and claudication. Resp: Denies dyspnea at rest, dyspnea with exercise, cough, sputum, wheezing, coughing up blood, and pleurisy. GI: Denies vomiting blood, jaundice, and fecal incontinence.   Denies dysphagia or odynophagia. GU : Denies urinary burning, blood in urine, urinary frequency, urinary hesitancy, nocturnal urination, and urinary incontinence. MS: Denies joint pain, limitation of movement, and swelling, stiffness, low back pain, extremity pain. Denies muscle weakness, cramps, atrophy.  Derm: Denies rash, itching, dry skin, hives, moles, warts, or unhealing ulcers.  Psych: Denies depression, anxiety, memory loss, suicidal ideation, hallucinations, paranoia, and confusion. Heme: Denies bruising, bleeding, and enlarged lymph nodes. Neuro:  Denies any headaches, dizziness, paresthesias. Endo:  Denies any problems with DM, thyroid, adrenal function.   Physical Exam: General:  Alert, well-developed, in NAD Head:  Normocephalic and atraumatic. Eyes:  Sclera clear, no icterus.   Conjunctiva pink. Ears:  Normal auditory acuity. Mouth:  No deformity or lesions.  Neck:  Supple; no masses . Lungs:  Clear throughout to auscultation.   No wheezes, crackles, or rhonchi. No acute distress. Heart:  Regular rate and rhythm; no murmurs. Abdomen:  Soft, nondistended, nontender. No masses, hepatomegaly. No obvious masses.  Normal bowel .    Rectal:  Deferred   Msk:  Symmetrical without gross  deformities.. Pulses:  Normal pulses noted. Extremities:  Without edema. Neurologic:  Alert and  oriented x4;  grossly normal neurologically. Skin:  Intact without significant lesions or rashes. Cervical Nodes:  No significant  cervical adenopathy. Psych:  Alert and cooperative. Normal mood and affect.   Impression / Plan:   Screening for varices in setting of cirrhosis with EGD.  Average risk CRC screening for colonoscopy.   Venita Lick. Russella Dar  09/14/2021, 10:45 AM See Loretha Stapler, [facility], to contact our on call provider

## 2021-09-14 NOTE — Op Note (Signed)
[facility] ?Patient Name: [name] ?Procedure Date: 09/14/2021 10:23 AM ?MRN: [id number] ?Endoscopist: Ladene Artist , MD ?Age: 71 ?Referring MD:  ?Date of Birth: 1951/02/23 ?Gender: Female ?Account #: [id number] ?Procedure:                Colonoscopy ?Indications:              Screening for colorectal malignant neoplasm ?Medicines:                Monitored Anesthesia Care ?Procedure:                Pre-Anesthesia Assessment: ?                          - Prior to the procedure, a History and Physical  ?                          was performed, and patient medications and  ?                          allergies were reviewed. The patient's tolerance of  ?                          previous anesthesia was also reviewed. The risks  ?                          and benefits of the procedure and the sedation  ?                          options and risks were discussed with the patient.  ?                          All questions were answered, and informed consent  ?                          was obtained. Prior Anticoagulants: The patient has  ?                          taken no previous anticoagulant or antiplatelet  ?                          agents. ASA Grade Assessment: III - A patient with  ?                          severe systemic disease. After reviewing the risks  ?                          and benefits, the patient was deemed in  ?                          satisfactory condition to undergo the procedure. ?                          After obtaining informed consent, the colonoscope  ?  was passed under direct vision. Throughout the  ?                          procedure, the patient's blood pressure, pulse, and  ?                          oxygen saturations were monitored continuously. The  ?                          Olympus [id number]-[id number] (#[id number]) Colonoscope was  ?                          introduced through the anus and advanced to the the  ?                          cecum,  identified by appendiceal orifice and  ?                          ileocecal valve. The ileocecal valve, appendiceal  ?                          orifice, and rectum were photographed. The quality  ?                          of the bowel preparation was good. The colonoscopy  ?                          was performed without difficulty. The patient  ?                          tolerated the procedure well. ?Scope In: 10:57:43 AM ?Scope Out: 11:13:53 AM ?Scope Withdrawal Time: 0 hours 14 minutes 12 seconds  ?Total Procedure Duration: 0 hours 16 minutes 10 seconds  ?Findings:                 The perianal and digital rectal examinations were  ?                          normal. ?                          A 7 mm polyp was found in the transverse colon. The  ?                          polyp was sessile. The polyp was removed with a  ?                          cold snare. Resection and retrieval were complete. ?                          Internal hemorrhoids were found during  ?                          retroflexion. The hemorrhoids were small and Grade  ?  I (internal hemorrhoids that do not prolapse). ?                          The exam was otherwise without abnormality on  ?                          direct and retroflexion views. ?Complications:            No immediate complications. Estimated blood loss:  ?                          None. ?Estimated Blood Loss:     Estimated blood loss: none. ?Impression:               - One 7 mm polyp in the transverse colon, removed  ?                          with a cold snare. Resected and retrieved. ?                          - Internal hemorrhoids. ?                          - The examination was otherwise normal on direct  ?                          and retroflexion views. ?Recommendation:           - Repeat colonoscopy vs no repeat due to age after  ?                          studies are complete for surveillance based on  ?                          pathology  results. ?                          - Patient has a contact number available for  ?                          emergencies. The signs and symptoms of potential  ?                          delayed complications were discussed with the  ?                          patient. Return to normal activities tomorrow.  ?                          Written discharge instructions were provided to the  ?                          patient. ?                          - Resume previous diet. ?                          -  Continue present medications. ?                          - Await pathology results. ?Ladene Artist, MD ?09/14/2021 11:26:57 AM ?This report has been signed electronically. ?

## 2021-09-14 NOTE — Progress Notes (Signed)
VS  DT ? ?Pt's states no medical or surgical changes since previsit or office visit. ? ?

## 2021-09-14 NOTE — Op Note (Signed)
[place] [facility] ?Patient Name: [name] ?Procedure Date: 09/14/2021 10:21 AM ?MRN: [id number] ?Endoscopist: Ladene Artist , MD ?Age: 71 ?Referring MD:  ?Date of Birth: December 14, 1950 ?Gender: Female ?Account #: [id number] ?Procedure:                Upper GI endoscopy ?Indications:              Screening procedure: Cirrhosis, screen for varices ?Medicines:                Monitored Anesthesia Care ?Procedure:                Pre-Anesthesia Assessment: ?                          - Prior to the procedure, a History and Physical  ?                          was performed, and patient medications and  ?                          allergies were reviewed. The patient's tolerance of  ?                          previous anesthesia was also reviewed. The risks  ?                          and benefits of the procedure and the sedation  ?                          options and risks were discussed with the patient.  ?                          All questions were answered, and informed consent  ?                          was obtained. Prior Anticoagulants: The patient has  ?                          taken no previous anticoagulant or antiplatelet  ?                          agents. ASA Grade Assessment: III - A patient with  ?                          severe systemic disease. After reviewing the risks  ?                          and benefits, the patient was deemed in  ?                          satisfactory condition to undergo the procedure. ?                          After obtaining informed consent, the endoscope was  ?  passed under direct vision. Throughout the  ?                          procedure, the patient's blood pressure, pulse, and  ?                          oxygen saturations were monitored continuously. The  ?                          Endoscope was introduced through the mouth, and  ?                          advanced to the second part of duodenum. The upper  ?                           GI endoscopy was accomplished without difficulty.  ?                          The patient tolerated the procedure well. ?Scope In: ?Scope Out: ?Findings:                 The examined esophagus was normal. ?                          The entire examined stomach was normal. ?                          A single 5 mm sessile polyp with no bleeding was  ?                          found in the duodenal bulb near the junction of D2.  ?                          The polyp was removed with a cold snare. Resection  ?                          and retrieval were complete. ?                          The exam of the duodenum was otherwise normal. ?Complications:            No immediate complications. ?Estimated Blood Loss:     Estimated blood loss was minimal. ?Impression:               - Normal esophagus. ?                          - Normal stomach. ?                          - A single duodenal polyp. Resected and retrieved. ?Recommendation:           - Patient has a contact number available for  ?                          emergencies. The signs and  symptoms of potential  ?                          delayed complications were discussed with the  ?                          patient. Return to normal activities tomorrow.  ?                          Written discharge instructions were provided to the  ?                          patient. ?                          - Resume previous diet. ?                          - Continue present medications. ?                          - Await pathology results. ?                          - Repeat upper endoscopy in 2-3 years for screening  ?                          for varices. Pathology may change interval for  ?                          repeat EGD. ?Ladene Artist, MD ?09/14/2021 11:34:12 AM ?This report has been signed electronically. ?

## 2021-09-14 NOTE — Progress Notes (Signed)
Called to room to assist during endoscopic procedure.  Patient ID and intended procedure confirmed with present staff. Received instructions for my participation in the procedure from the performing physician.  

## 2021-09-14 NOTE — Patient Instructions (Signed)
YOU HAD AN ENDOSCOPIC PROCEDURE TODAY AT THE [facility]:   Refer to the procedure report that was given to you for any specific questions about what was found during the examination.  If the procedure report does not answer your questions, please call your gastroenterologist to clarify.  If you requested that your care partner not be given the details of your procedure findings, then the procedure report has been included in a sealed envelope for you to review at your convenience later.  YOU SHOULD EXPECT: Some feelings of bloating in the abdomen. Passage of more gas than usual.  Walking can help get rid of the air that was put into your GI tract during the procedure and reduce the bloating. If you had a lower endoscopy (such as a colonoscopy or flexible sigmoidoscopy) you may notice spotting of blood in your stool or on the toilet paper. If you underwent a bowel prep for your procedure, you may not have a normal bowel movement for a few days.  Please Note:  You might notice some irritation and congestion in your nose or some drainage.  This is from the oxygen used during your procedure.  There is no need for concern and it should clear up in a day or so.  SYMPTOMS TO REPORT IMMEDIATELY:   Following lower endoscopy (colonoscopy or flexible sigmoidoscopy):  Excessive amounts of blood in the stool  Significant tenderness or worsening of abdominal pains  Swelling of the abdomen that is new, acute  Fever of 100F or higher   Following upper endoscopy (EGD)  Vomiting of blood or coffee ground material  New chest pain or pain under the shoulder blades  Painful or persistently difficult swallowing  New shortness of breath  Fever of 100F or higher  Black, tarry-looking stools  For urgent or emergent issues, a gastroenterologist can be reached at any hour by calling [phone]. Do not use MyChart messaging for urgent concerns.    DIET:  We do recommend a small meal at first, but  then you may proceed to your regular diet.  Drink plenty of fluids but you should avoid alcoholic beverages for 24 hours.  ACTIVITY:  You should plan to take it easy for the rest of today and you should NOT DRIVE or use heavy machinery until tomorrow (because of the sedation medicines used during the test).    FOLLOW UP: Our staff will call the number listed on your records 48-72 hours following your procedure to check on you and address any questions or concerns that you may have regarding the information given to you following your procedure. If we do not reach you, we will leave a message.  We will attempt to reach you two times.  During this call, we will ask if you have developed any symptoms of COVID 19. If you develop any symptoms (ie: fever, flu-like symptoms, shortness of breath, cough etc.) before then, please call [phone].  If you test positive for Covid 19 in the 2 weeks post procedure, please call and report this information to us.    If any biopsies were taken you will be contacted by phone or by letter within the next 1-3 weeks.  Please call us at [phone] if you have not heard about the biopsies in 3 weeks.    SIGNATURES/CONFIDENTIALITY: You and/or your care partner have signed paperwork which will be entered into your electronic medical record.  These signatures attest to the fact that that the information above on   your After Visit Summary has been reviewed and is understood.  Full responsibility of the confidentiality of this discharge information lies with you and/or your care-partner. 

## 2021-09-18 ENCOUNTER — Telehealth: Payer: Self-pay

## 2021-09-18 NOTE — Telephone Encounter (Signed)
?  Follow up Call- ? ? ?  09/14/2021  ? 10:30 AM  ?Call back number  ?Post procedure Call Back phone  # [phone]  ?Permission to leave phone message Yes  ?  ? ?Patient questions: ? ?Do you have a fever, pain , or abdominal swelling? No. ?Pain Score  0 * ? ?Have you tolerated food without any problems? Yes.   ? ?Have you been able to return to your normal activities? Yes.   ? ?Do you have any questions about your discharge instructions: ?Diet   No. ?Medications  No. ?Follow up visit  No. ? ?Do you have questions or concerns about your Care? No. ? ?Actions: ?* If pain score is 4 or above: ?No action needed, pain <4. ? ? ?

## 2021-09-18 NOTE — Telephone Encounter (Signed)
Left message on follow up call. 

## 2021-09-25 ENCOUNTER — Encounter: Payer: Self-pay | Admitting: Gastroenterology

## 2021-11-27 ENCOUNTER — Other Ambulatory Visit: Payer: Self-pay | Admitting: Neurosurgery

## 2021-11-27 DIAGNOSIS — I671 Cerebral aneurysm, nonruptured: Secondary | ICD-10-CM

## 2021-11-28 ENCOUNTER — Other Ambulatory Visit: Payer: Self-pay | Admitting: Neurosurgery

## 2021-12-05 ENCOUNTER — Inpatient Hospital Stay (HOSPITAL_COMMUNITY): Admission: RE | Admit: 2021-12-05 | Payer: Medicare Other | Source: Ambulatory Visit

## 2021-12-12 ENCOUNTER — Other Ambulatory Visit: Payer: Self-pay

## 2021-12-12 ENCOUNTER — Ambulatory Visit (HOSPITAL_COMMUNITY)
Admission: RE | Admit: 2021-12-12 | Discharge: 2021-12-12 | Disposition: A | Discharge status: Home / Self Care | Payer: Medicare HMO | Source: Ambulatory Visit | Attending: Neurosurgery | Admitting: Neurosurgery

## 2021-12-12 ENCOUNTER — Other Ambulatory Visit: Payer: Self-pay | Admitting: Neurosurgery

## 2021-12-12 DIAGNOSIS — J449 Chronic obstructive pulmonary disease, unspecified: Secondary | ICD-10-CM | POA: Insufficient documentation

## 2021-12-12 DIAGNOSIS — I671 Cerebral aneurysm, nonruptured: Secondary | ICD-10-CM | POA: Insufficient documentation

## 2021-12-12 DIAGNOSIS — I129 Hypertensive chronic kidney disease with stage 1 through stage 4 chronic kidney disease, or unspecified chronic kidney disease: Secondary | ICD-10-CM | POA: Insufficient documentation

## 2021-12-12 DIAGNOSIS — K7460 Unspecified cirrhosis of liver: Secondary | ICD-10-CM | POA: Insufficient documentation

## 2021-12-12 DIAGNOSIS — I4891 Unspecified atrial fibrillation: Secondary | ICD-10-CM | POA: Diagnosis not present

## 2021-12-12 DIAGNOSIS — F17210 Nicotine dependence, cigarettes, uncomplicated: Secondary | ICD-10-CM | POA: Insufficient documentation

## 2021-12-12 DIAGNOSIS — N183 Chronic kidney disease, stage 3 unspecified: Secondary | ICD-10-CM | POA: Insufficient documentation

## 2021-12-12 HISTORY — PX: IR ANGIO VERTEBRAL SEL VERTEBRAL UNI L MOD SED: IMG5367

## 2021-12-12 HISTORY — PX: IR ANGIO INTRA EXTRACRAN SEL COM CAROTID INNOMINATE BILAT MOD SED: IMG5360

## 2021-12-12 LAB — CBC WITH DIFFERENTIAL/PLATELET
Abs Immature Granulocytes: 0.01 10*3/uL (ref 0.00–0.07)
Basophils Absolute: 0 10*3/uL (ref 0.0–0.1)
Basophils Relative: 1 %
Eosinophils Absolute: 0.1 10*3/uL (ref 0.0–0.5)
Eosinophils Relative: 3 %
HCT: 30.3 % — ABNORMAL LOW (ref 36.0–46.0)
Hemoglobin: 11.3 g/dL — ABNORMAL LOW (ref 12.0–15.0)
Immature Granulocytes: 0 %
Lymphocytes Relative: 28 %
Lymphs Abs: 1.3 10*3/uL (ref 0.7–4.0)
MCH: 32.7 pg (ref 26.0–34.0)
MCHC: 37.3 g/dL — ABNORMAL HIGH (ref 30.0–36.0)
MCV: 87.6 fL (ref 80.0–100.0)
Monocytes Absolute: 0.4 10*3/uL (ref 0.1–1.0)
Monocytes Relative: 8 %
Neutro Abs: 2.9 10*3/uL (ref 1.7–7.7)
Neutrophils Relative %: 60 %
Platelets: 143 10*3/uL — ABNORMAL LOW (ref 150–400)
RBC: 3.46 MIL/uL — ABNORMAL LOW (ref 3.87–5.11)
RDW: 13.5 % (ref 11.5–15.5)
WBC: 4.8 10*3/uL (ref 4.0–10.5)
nRBC: 0 % (ref 0.0–0.2)

## 2021-12-12 LAB — BASIC METABOLIC PANEL
Anion gap: 9 (ref 5–15)
BUN: 13 mg/dL (ref 8–23)
CO2: 27 mmol/L (ref 22–32)
Calcium: 8.9 mg/dL (ref 8.9–10.3)
Chloride: 104 mmol/L (ref 98–111)
Creatinine, Ser: 0.73 mg/dL (ref 0.44–1.00)
GFR, Estimated: >60 mL/min (ref >60)
Glucose, Bld: 95 mg/dL (ref 70–99)
Potassium: 3.1 mmol/L — ABNORMAL LOW (ref 3.5–5.1)
Sodium: 140 mmol/L (ref 135–145)

## 2021-12-12 LAB — URINALYSIS, ROUTINE W REFLEX MICROSCOPIC
Bacteria, UA: NONE SEEN
Bilirubin Urine: NEGATIVE
Glucose, UA: NEGATIVE mg/dL
Hgb urine dipstick: NEGATIVE
Ketones, ur: NEGATIVE mg/dL
Nitrite: NEGATIVE
Protein, ur: NEGATIVE mg/dL
Specific Gravity, Urine: 1.011 (ref 1.005–1.030)
pH: 6 (ref 5.0–8.0)

## 2021-12-12 LAB — APTT: aPTT: 45 seconds — ABNORMAL HIGH (ref 24–36)

## 2021-12-12 LAB — PROTIME-INR
INR: 1.1 (ref 0.8–1.2)
Prothrombin Time: 14.5 seconds (ref 11.4–15.2)

## 2021-12-12 IMAGING — XA IR ANGIO INTRA EXTRACRAN SEL COM CAROTID INNOMINATE BILAT MOD SE
10 of 11 series · 13 of 24 positions shown · IV contrast (IODINE)
Comparison: none

PROCEDURE:
DIAGNOSTIC CEREBRAL ANGIOGRAM
HISTORY: The patient is a 70-year-old woman with incidental discovery of a
large basilar apex aneurysm. Patient has multiple vascular risk
factors and therefore presents today for further workup with
diagnostic cerebral angiogram.
TECHNIQUE: CATHETERS AND WIRES
5-French JB-1 catheter

[Series 1: cerebral care 2 · 2 acquisitions, 1 frame shown (1 of 9)]
[im 1/2]
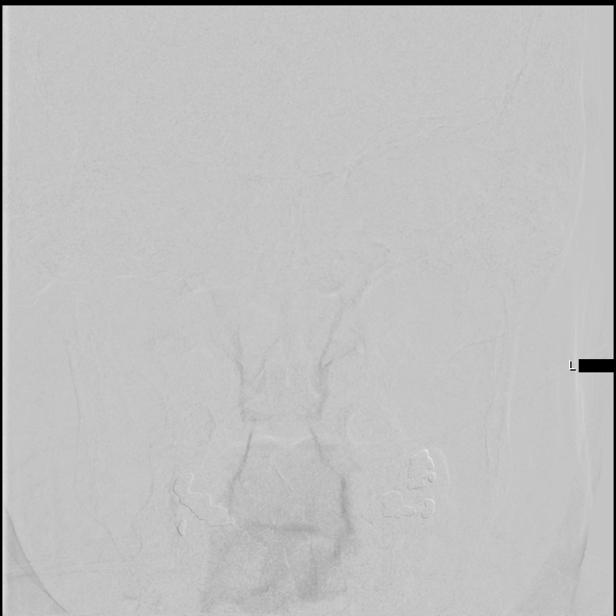

[Series 2: cerebral care 2 · 2 acquisitions, 1 frame shown (2 of 9)]
[im 1/2]
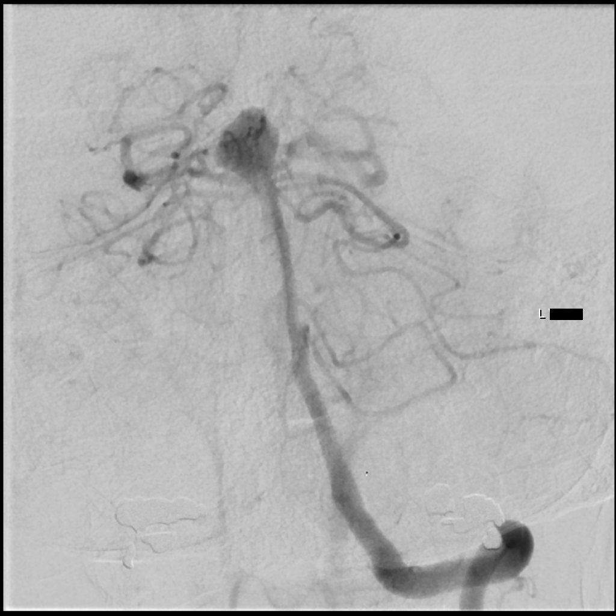

[Series 3: cerebral care 2 · 1 of 12 frames shown (3 of 9)]
[frame 11/12]
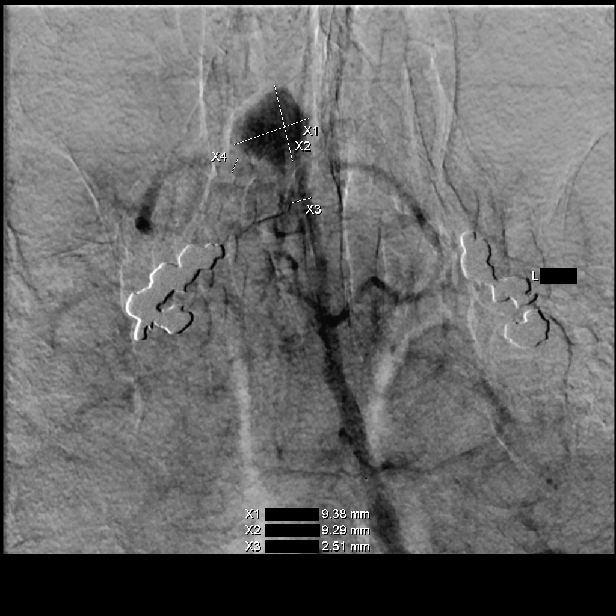

[Series 5: cerebral care 2 · 1 of 6 frames shown (4 of 9)]
[frame 3/6]
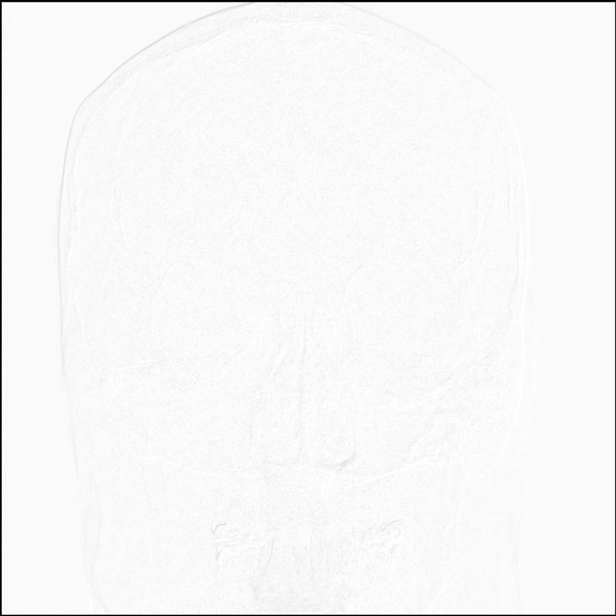

[Series 6: cerebral care 2 · 2 acquisitions, 1 frame shown (5 of 9)]
[im 1/2]
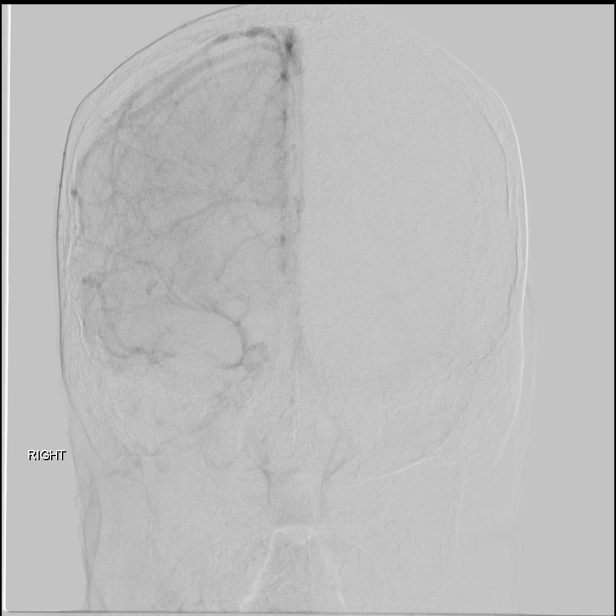

[Series 7: cerebral care 2 · 2 acquisitions, 1 frame shown (6 of 9)]
[im 1/2]
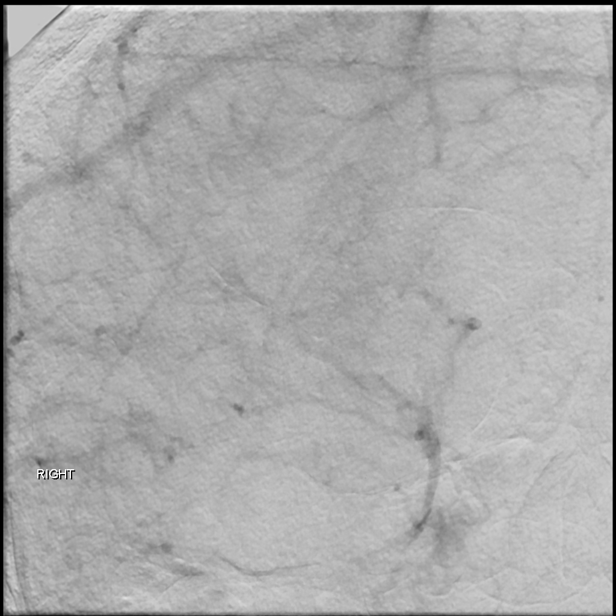

[Series 8: cerebral care 2 · 2 acquisitions, 2 frames shown (7 of 9)]
[im 2/2]
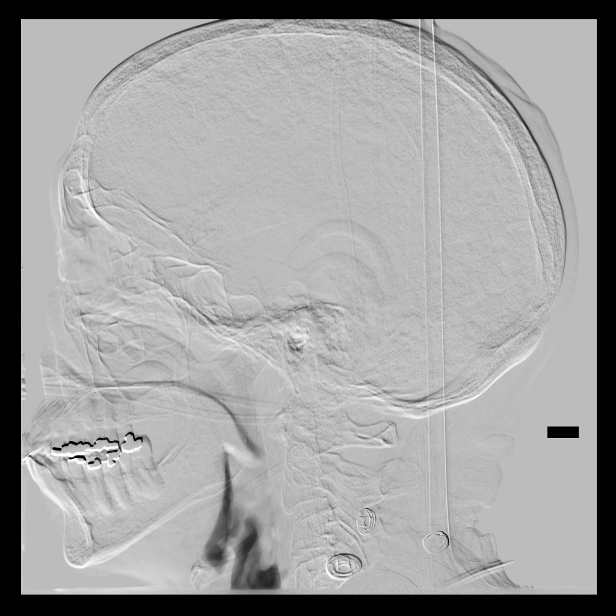
[im 2/2]
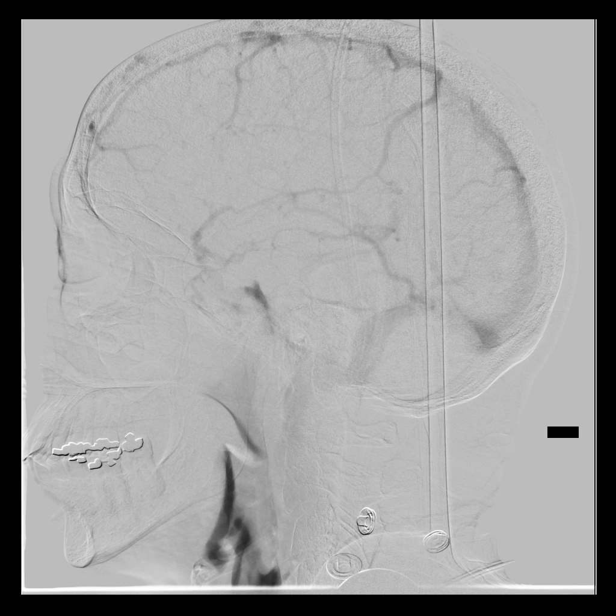

[Series 9: cerebral care 2 · 2 acquisitions, 1 frame shown (8 of 9)]
[im 2/2]
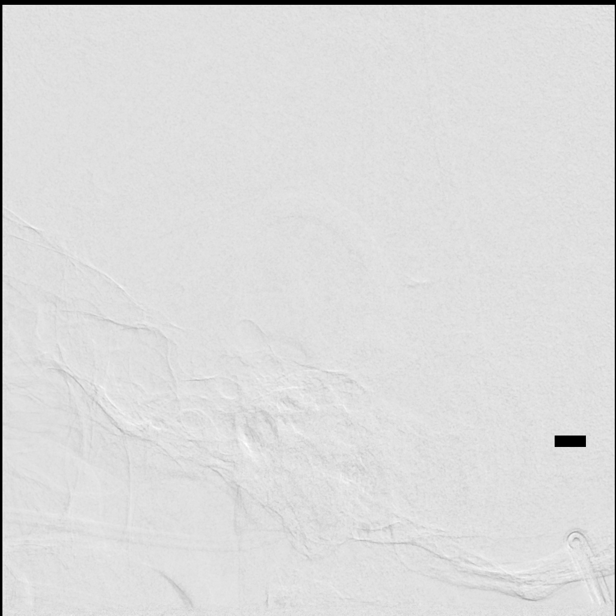

[Series 10: cerebral care 2 · 2 acquisitions, 1 frame shown (9 of 9)]
[im 1/2]
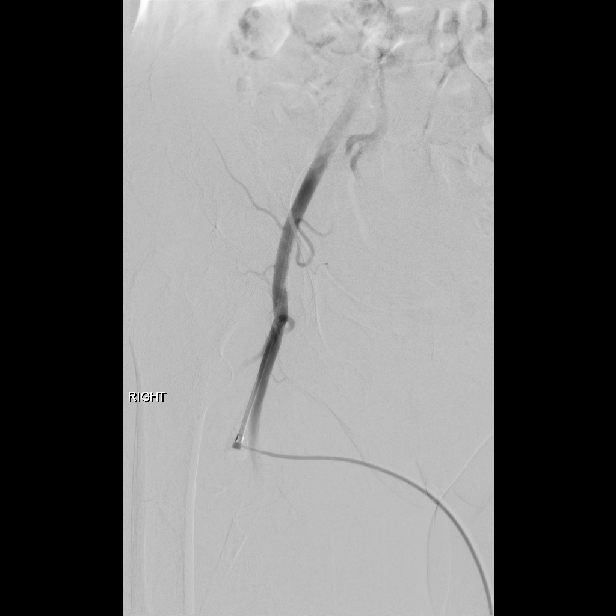

[Series 300: dr. (person_name) · 3 of 15 slices shown]
[im 1/15]
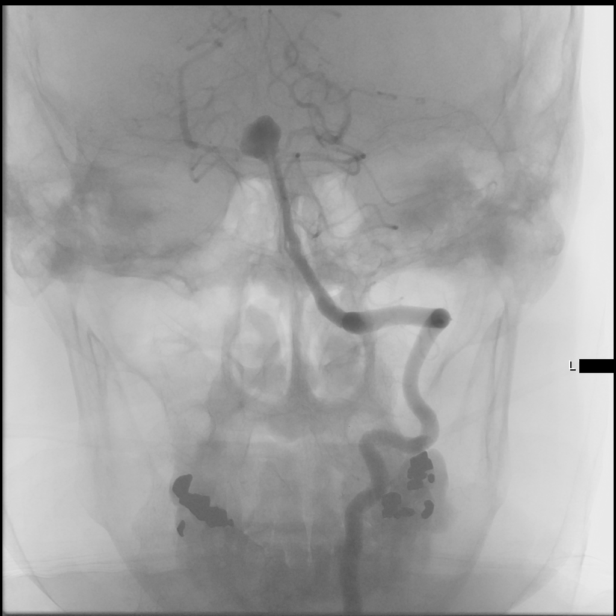
[im 8/15]
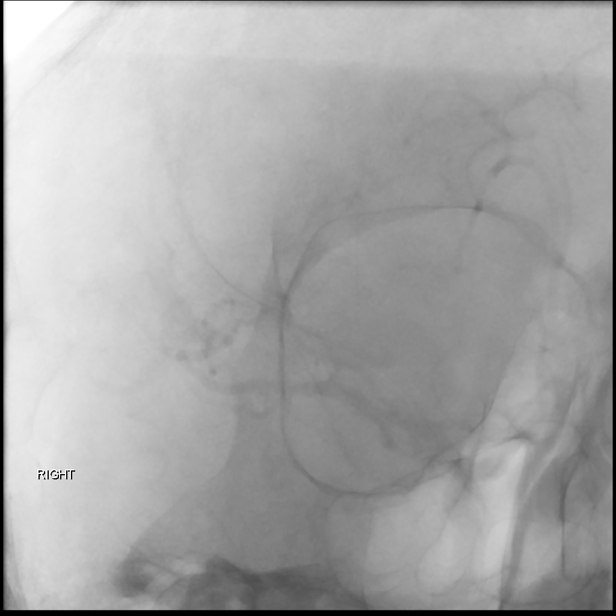
[im 15/15]
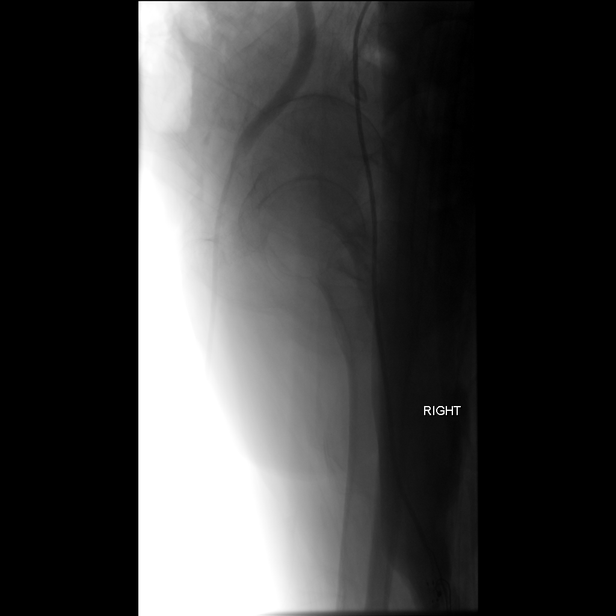

[13 of 24 positions shown; findings below may reference images not displayed]

ACCESS:
The technical aspects of the procedure as well as its potential
risks and benefits were reviewed with the patient. These risks
included but were not limited bleeding, infection, allergic
reaction, damage to organs or vital structures, stroke,
non-diagnostic procedure, and the catastrophic outcomes of heart
attack, coma, and death. With an understanding of these risks,
informed consent was obtained and witnessed. The patient was placed
in the supine position on the angiography table and the skin of
right groin prepped in the usual sterile fashion.

The procedure was performed under local anesthesia (1%-solution of
bicarbonate-buffered Lidocaine) and conscious sedation with 1mg
versed and [4O] fentanyl monitored by myself and the
in-suite nurse using continuous pulse-oximetry, heart rate, and
non-invasive blood-pressure.

A 5- French sheath was introduced in the right common femoral artery
using Seldinger technique. A fluoro-phase sequence was used to
document the sheath position.

MEDICATIONS:
HEPARIN: [4O] Units total.

CONTRAST:  45mL OMNIPAQUE IOHEXOL 300 MG/ML  SOLNcc, Omnipaque 300

FLUOROSCOPY TIME:  FLUOROSCOPY TIME: See IR records
0.035" glidewire

VESSELS CATHETERIZED
Right common carotid

Left common carotid

Left vertebral

Right common femoral

VESSELS STUDIED
Right common carotid, head

Left common carotid, head

Left vertebral

Right femoral

PROCEDURAL NARRATIVE
A 5-Fr JB-1 catheter was advanced over a 0.035 glidewire into the
aortic arch. The above vessels were then sequentially catheterized
and cervical / cerebral angiograms taken. After review of images,
the catheter was removed without incident.
FINDINGS: Right common carotid, head:

Injection reveals the presence of a widely patent ICA, M1, and A1
segments and their branches. No aneurysms, AVMs, or high-flow
fistulas are seen. Visualized cranial branches of the external
carotid artery are unremarkable. The parenchymal and venous phases
are normal. The venous sinuses are widely patent.

Left common carotid, head:

Injection reveals the presence of a widely patent ICA, A1, and M1
segments and their branches. No aneurysms, AVMs, or high-flow
fistulas are seen. Visualized cranial branches of the left external
carotid artery are unremarkable. The parenchymal and venous phases
are normal. The venous sinuses are widely patent.

Left vertebral:

Injection reveals the presence of a widely patent vertebral artery.
This leads to a widely patent basilar artery that terminates in
bilateral P1. There is a proximal basilar fenestration just distal
to the vertebrobasilar junction. There is a superiorly projecting
aneurysm at the basilar apex measuring approximally 9.3 x 9.4 mm.
The neck measures approximately 4 mm. The parenchymal and venous
phases are normal. The venous sinuses are widely patent.

Right femoral:

Normal vessel. No significant atherosclerotic disease. Arterial
sheath in adequate position.

DISPOSITION:
Upon completion of the study, the femoral sheath was removed and
hemostasis obtained by manual compression. Good proximal and distal
lower extremity pulses were documented upon achievement of
hemostasis. The procedure was well tolerated and no early
complications were observed. The patient was transferred to the
holding area to lay flat for 6 hours.
IMPRESSION: 1.  Approximately 9.4 mm basilar apex aneurysm, as described above.

The preliminary results of this procedure were shared with the
patient and the patient's family.

## 2021-12-12 MED ORDER — HYDRALAZINE HCL 20 MG/ML IJ SOLN
INTRAMUSCULAR | Status: AC
  Filled 2021-12-12: qty 1

## 2021-12-12 MED ORDER — LISINOPRIL 20 MG PO TABS
40.0000 mg | ORAL_TABLET | Freq: Once | ORAL | Status: DC
Start: 2021-12-12 — End: 2021-12-13
  Filled 2021-12-12 (×2): qty 2

## 2021-12-12 MED ORDER — CHLORHEXIDINE GLUCONATE CLOTH 2 % EX PADS: 6.0000 | MEDICATED_PAD | Freq: Once | CUTANEOUS | Status: DC

## 2021-12-12 MED ORDER — SODIUM CHLORIDE 0.9 % IV SOLN: INTRAVENOUS | Status: DC

## 2021-12-12 MED ORDER — HYDRALAZINE HCL 20 MG/ML IJ SOLN
10.0000 mg | Freq: Once | INTRAMUSCULAR | Status: AC
  Administered 2021-12-12: 10 mg via INTRAVENOUS

## 2021-12-12 MED ORDER — FENTANYL CITRATE (PF) 100 MCG/2ML IJ SOLN
INTRAMUSCULAR | Status: AC | PRN
  Administered 2021-12-12: 25 ug via INTRAVENOUS

## 2021-12-12 MED ORDER — HYDROCODONE-ACETAMINOPHEN 5-325 MG PO TABS: 1.0000 | ORAL_TABLET | ORAL | Status: DC | PRN

## 2021-12-12 MED ORDER — MIDAZOLAM HCL 2 MG/2ML IJ SOLN
INTRAMUSCULAR | Status: AC
  Filled 2021-12-12: qty 2

## 2021-12-12 MED ORDER — IOHEXOL 300 MG/ML  SOLN
100.0000 mL | Freq: Once | INTRAMUSCULAR | Status: AC | PRN
  Administered 2021-12-12: 45 mL via INTRA_ARTERIAL

## 2021-12-12 MED ORDER — HEPARIN SODIUM (PORCINE) 1000 UNIT/ML IJ SOLN
INTRAMUSCULAR | Status: AC
  Filled 2021-12-12: qty 10

## 2021-12-12 MED ORDER — MIDAZOLAM HCL 2 MG/2ML IJ SOLN
INTRAMUSCULAR | Status: AC | PRN
  Administered 2021-12-12: 1 mg via INTRAVENOUS

## 2021-12-12 MED ORDER — HEPARIN SODIUM (PORCINE) 1000 UNIT/ML IJ SOLN
INTRAMUSCULAR | Status: AC | PRN
  Administered 2021-12-12: 2000 [IU] via INTRAVENOUS

## 2021-12-12 MED ORDER — HYDRALAZINE HCL 20 MG/ML IJ SOLN
INTRAMUSCULAR | Status: AC | PRN
  Administered 2021-12-12 (×2): 10 mg via INTRAVENOUS

## 2021-12-12 MED ORDER — FENTANYL CITRATE (PF) 100 MCG/2ML IJ SOLN
INTRAMUSCULAR | Status: AC
  Filled 2021-12-12: qty 2

## 2021-12-12 MED ORDER — LISINOPRIL 40 MG PO TABS
40.0000 mg | ORAL_TABLET | Freq: Once | ORAL | Status: DC
Start: 2021-12-12 — End: 2021-12-12
  Administered 2021-12-12: 40 mg via ORAL
  Filled 2021-12-12 (×2): qty 1

## 2021-12-12 MED ORDER — CEFAZOLIN SODIUM-DEXTROSE 2-4 GM/100ML-% IV SOLN
INTRAVENOUS | Status: AC
  Filled 2021-12-12: qty 100

## 2021-12-12 MED ORDER — CEFAZOLIN SODIUM-DEXTROSE 2-4 GM/100ML-% IV SOLN: 2.0000 g | INTRAVENOUS | Status: DC

## 2021-12-12 MED ORDER — LIDOCAINE HCL 1 % IJ SOLN
INTRAMUSCULAR | Status: AC
  Filled 2021-12-12: qty 20

## 2021-12-12 NOTE — Sedation Documentation (Signed)
Provider notified of BP 213/72. Provider gave verbal order of '10mg'$  of IV hydralazine. Medication given, please see MAR

## 2021-12-12 NOTE — Sedation Documentation (Signed)
Pt transported to short stay with this RN. Bedside handoff completed. No hematoma or external bleeding noted to the right femoral puncture site and DP pulses +2 bilaterally. No doppler in room, unable to assess PT pulses. Primary RN notified that PT pulses are found with doppler. Pt alert and resting in bed. Call bell placed next to pt in the bed. All primary RN's questions answered prior to this RN's departure. This RN informed short stay RN, that Dr. Kathyrn Sheriff did not give BP parameters.

## 2021-12-12 NOTE — Brief Op Note (Signed)
  NEUROSURGERY BRIEF OPERATIVE  NOTE   PREOP DX: Aneurysm  POSTOP DX: Same  PROCEDURE: Diagnostic cerebral angiogram  SURGEON: Dr. Consuella Lose, MD  ANESTHESIA: IV Sedation with Local  EBL: Minimal  SPECIMENS: None  COMPLICATIONS: None  CONDITION: Stable to recovery  FINDINGS (Full report in [facility]): 1. ~9.36m basilar apex aneurysm   NConsuella Lose MD [facility]

## 2021-12-12 NOTE — Progress Notes (Signed)
Notified Dr. Kathyrn Sheriff of BP 243/98-225/86.  Patient stated she did not take any meds today. Order received to give lisinopril, hold betablocker, give hydralazine 10 mg IV.

## 2021-12-12 NOTE — Sedation Documentation (Signed)
IR tech is providing manual pressure hold to the right femoral puncture site. Unable to assess pulses at this time.

## 2021-12-12 NOTE — Progress Notes (Signed)
Purewick placed. Education provided.

## 2021-12-12 NOTE — Sedation Documentation (Signed)
As per Dr. Kathyrn Sheriff,  ASA of 3, Airway of 3

## 2021-12-12 NOTE — H&P (Signed)
Chief Complaint  Aneurysm  History of Present Illness  [name] is a 71 y.o. 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 She presents for elective w/u with diagnostic angiogram. Of note, she has a hx of HTN, A-fib, COPD, cirrhosis. She is a smoker 1ppd and has FHx of brain aneurysm in her father.  Past Medical History   Past Medical History:  Diagnosis Date   ACROCHORDON 03/16/2010   Arthritis    Chronic kidney disease    III   COPD (chronic obstructive pulmonary disease) (HCC)    GI bleeding 06/2018   Heart murmur    Hypertension    Hypothyroidism    Liver cirrhosis ([place])    Multiple duodenal ulcers 07/16/2018   associated with melena, anemia, gastritis. due to NSAID.  hx gastric ulcers and bleeding 2004, 2007   Thyroid disease    S/p Radiation    Past Surgical History   Past Surgical History:  Procedure Laterality Date   BIOPSY  07/16/2018   Procedure: BIOPSY;  Surgeon: Mauri Pole, MD;  Location: [facility];  Service: Endoscopy;;   BREAST EXCISIONAL BIOPSY Left    benign   CHOLECYSTECTOMY N/A 12/07/2015   Procedure: LAPAROSCOPIC CHOLECYSTECTOMY WITH INTRAOPERATIVE CHOLANGIOGRAM;  Surgeon: Alphonsa Overall, MD;  Location: [facility];  Service: General;  Laterality: N/A;   COLONOSCOPY     ERCP N/A 12/06/2015   Procedure: ENDOSCOPIC RETROGRADE CHOLANGIOPANCREATOGRAPHY (ERCP);  Surgeon: Milus Banister, MD;  Location: [facility];  Service: Endoscopy;  Laterality: N/A;   ESOPHAGOGASTRODUODENOSCOPY (EGD) WITH PROPOFOL N/A 07/16/2018   Procedure: ESOPHAGOGASTRODUODENOSCOPY (EGD) WITH PROPOFOL;  Surgeon: Mauri Pole, MD;  Location: [facility];  Service: Endoscopy;  Laterality: N/A;   IM NAILING TIBIA Right 07/19/2012   DR Maureen Ralphs   IR VERTEBROPLASTY CERV/THOR BX INC UNI/BIL INC/INJECT/IMAGING  10/17/2020   IR VERTEBROPLASTY EA ADDL (T&LS) BX INC UNI/BIL INC  INJECT/IMAGING  10/17/2020   SHOULDER HEMI-ARTHROPLASTY Right 07/22/2013   Procedure: RIGHT SHOULDER HEMI-ARTHROPLASTY;  Surgeon: Augustin Schooling, MD;  Location: [facility];  Service: Orthopedics;  Laterality: Right;   TIBIA IM NAIL INSERTION Right 07/19/2013   Procedure: INTRAMEDULLARY (IM) NAIL TIBIAL;  Surgeon: Gearlean Alf, MD;  Location: [place] [facility];  Service: Orthopedics;  Laterality: Right;   TUBAL LIGATION     UPPER GASTROINTESTINAL ENDOSCOPY      Social History   Social History   Tobacco Use   Smoking status: Every Day    Packs/day: 1.00    Years: 40.00    Total pack years: 40.00    Types: Cigarettes   Smokeless tobacco: Never  Vaping Use   Vaping Use: Never used  Substance Use Topics   Alcohol use: No    Comment: stopped drinking 5 weeks ago/updated 07/09/2018   Drug use: No    Medications   Prior to Admission medications   Medication Sig Start Date End Date Taking? Authorizing Provider  albuterol (VENTOLIN HFA) 108 (90 Base) MCG/ACT inhaler SMARTSIG:2 Puff(s) By Mouth Every 4 Hours PRN 06/15/21  Yes [provider]  B Complex Vitamins (B COMPLEX 1 PO) Take 1 tablet by mouth daily.   Yes [provider]  calcium carbonate (OSCAL) 1500 (600 Ca) MG TABS tablet Take 600 mg of elemental calcium by mouth daily with breakfast.   Yes [provider]  citalopram (CELEXA) 20 MG tablet TAKE 1 TABLET BY MOUTH EVERY DAY. Patient taking differently: Take 20 mg by mouth  daily. 03/17/14  Yes Patrecia Pour, MD  Flaxseed, Linseed, (FLAX SEED OIL PO) Take 1 tablet by mouth daily.   Yes [provider]  folic acid (FOLVITE) 456 MCG tablet Take 400 mcg by mouth daily.   Yes [provider]  gabapentin (NEURONTIN) 100 MG capsule Take by mouth. 02/21/21  Yes [provider]  hydrALAZINE (APRESOLINE) 25 MG tablet Take 3 tablets (75 mg total) by mouth 3 (three) times daily. 10/18/20 08/24/23 Yes Elodia Florence., MD  ipratropium-albuterol  (DUONEB) 0.5-2.5 (3) MG/3ML SOLN Take 3 mLs by nebulization every 6 (six) hours as needed. 10/27/20  Yes Shelly Coss, MD  levothyroxine (SYNTHROID) 112 MCG tablet Take 112 mcg by mouth daily. 08/18/21  Yes [provider]  lisinopril (ZESTRIL) 40 MG tablet Take 1 tablet by mouth daily. 09/26/20  Yes [provider]  metoprolol succinate (TOPROL-XL) 50 MG 24 hr tablet TAKE 1 TABLET BY MOUTH ONCE DAILY (TAKE  WITH  OR  IMMEDIATELY  FOLLOWING  A  MEAL) 08/03/21  Yes Tolia, Sunit, DO  Multiple Vitamin (MULTIVITAMIN WITH MINERALS) TABS tablet Take 1 tablet by mouth daily.   Yes [provider]  multivitamin-lutein (OCUVITE-LUTEIN) CAPS capsule Take 1 capsule by mouth daily.   Yes [provider]  omega-3 acid ethyl esters (LOVAZA) 1 g capsule Take 1 capsule (1 g total) by mouth daily. 10/18/20 08/23/24 Yes Elodia Florence., MD  pantoprazole (PROTONIX) 40 MG tablet Take 1 tablet (40 mg total) by mouth at bedtime. 07/06/21 09/01/23 Yes Ladene Artist, MD  VITAMIN E PO Take 1 tablet by mouth 3 (three) times a week.   Yes [provider]  diclofenac Sodium (VOLTAREN) 1 % GEL Apply 2 g topically 4 (four) times daily as needed (pain). 10/18/20   Elodia Florence., MD    Allergies  No Known Allergies  Review of Systems  ROS  Neurologic Exam  Awake, alert, oriented Memory and concentration grossly intact Speech fluent, appropriate CN grossly intact Motor exam: Upper Extremities Deltoid Bicep Tricep Grip  Right 5/5 5/5 5/5 5/5  Left 5/5 5/5 5/5 5/5   Lower Extremities IP Quad PF DF EHL  Right 5/5 5/5 5/5 5/5 5/5  Left 5/5 5/5 5/5 5/5 5/5   Sensation grossly intact to LT  Imaging  CTA demonstrates 1cm superiorly projecting basilar apex aneurysm  Impression  - 71 y.o. 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 - HTN - pt reports consistent increase in BP at doctors visits and hospital  visits. Appears to be improving with home anti-hypertensives and IV meds.  Plan  - With improvement in BP, we can proceed with diagnostic angiogram  I have reviewed the details of the procedure with the patient. We have discussed the risks, benefits, and alternatives in the office. All questions today were answered and she provided consent to proceed.   Consuella Lose, MD [facility]

## 2021-12-27 ENCOUNTER — Other Ambulatory Visit (HOSPITAL_COMMUNITY): Payer: Self-pay | Admitting: Neurosurgery

## 2021-12-27 DIAGNOSIS — I671 Cerebral aneurysm, nonruptured: Secondary | ICD-10-CM

## 2022-01-02 ENCOUNTER — Other Ambulatory Visit: Payer: Self-pay | Admitting: Neurosurgery

## 2022-01-04 NOTE — Pre-Procedure Instructions (Signed)
Surgical Instructions    Your procedure is scheduled on Tuesday, July 18th.  Report to [facility] Main Entrance "A" at 09:30 A.M., then check in with the Admitting office.  Call this number if you have problems the morning of surgery:  [phone]   If you have any questions prior to your surgery date call [phone]: Open Monday-Friday 8am-4pm    Remember:  Do not eat after midnight the night before your surgery  You may drink clear liquids until 08:30 AM the morning of your surgery.   Clear liquids allowed are: Water, Non-Citrus Juices (without pulp), Carbonated Beverages, Clear Tea, Black Coffee Only (NO MILK, CREAM OR POWDERED CREAMER of any kind), and Gatorade.    Take these medicines the morning of surgery with A SIP OF WATER  citalopram (CELEXA)  gabapentin (NEURONTIN) hydrALAZINE (APRESOLINE)  levothyroxine (SYNTHROID)  metoprolol succinate (TOPROL-XL)  If needed: albuterol (VENTOLIN HFA)- if needed, bring with you day of surgery ipratropium-albuterol (DUONEB)   Follow your surgeon's instructions on when to stop Aspirin and Brilinta.  If no instructions were given by your surgeon then you will need to call the office to get those instructions.     As of today, STOP taking any Aspirin (unless otherwise instructed by your surgeon) Aleve, Naproxen, Ibuprofen, Motrin, Advil, Goody's, BC's, all herbal medications, fish oil, and all vitamins. This includes diclofenac Sodium (VOLTAREN) 1 % GEL.                     Do NOT Smoke (Tobacco/Vaping) for 24 hours prior to your procedure.  If you use a CPAP at night, you may bring your mask/headgear for your overnight stay.   Contacts, glasses, piercing's, hearing aid's, dentures or partials may not be worn into surgery, please bring cases for these belongings.    For patients admitted to the hospital, discharge time will be determined by your treatment team.   Patients discharged the day of surgery will not be allowed to drive  home, and someone needs to stay with them for 24 hours.  SURGICAL WAITING ROOM VISITATION Patients having surgery or a procedure may have two support people in the waiting room. These visitors may be switched out with other visitors if needed. Children under the age of 59 must have an adult accompany them who is not the patient. If the patient needs to stay at the hospital during part of their recovery, the visitor guidelines for inpatient rooms apply.  Please refer to the [facility] website for the visitor guidelines for Inpatients (after your surgery is over and you are in a regular room).    Special instructions:   [facility]- Preparing For Surgery  Before surgery, you can play an important role. Because skin is not sterile, your skin needs to be as free of germs as possible. You can reduce the number of germs on your skin by washing with CHG (chlorahexidine gluconate) Soap before surgery.  CHG is an antiseptic cleaner which kills germs and bonds with the skin to continue killing germs even after washing.    Oral Hygiene is also important to reduce your risk of infection.  Remember - BRUSH YOUR TEETH THE MORNING OF SURGERY WITH YOUR REGULAR TOOTHPASTE  Please do not use if you have an allergy to CHG or antibacterial soaps. If your skin becomes reddened/irritated stop using the CHG.  Do not shave (including legs and underarms) for at least 48 hours prior to first CHG shower. It is OK to shave  your face.  Please follow these instructions carefully.   Shower the NIGHT BEFORE SURGERY and the MORNING OF SURGERY  If you chose to wash your hair, wash your hair first as usual with your normal shampoo.  After you shampoo, rinse your hair and body thoroughly to remove the shampoo.  Use CHG Soap as you would any other liquid soap. You can apply CHG directly to the skin and wash gently with a scrungie or a clean washcloth.   Apply the CHG Soap to your body ONLY FROM THE NECK DOWN.  Do not use  on open wounds or open sores. Avoid contact with your eyes, ears, mouth and genitals (private parts). Wash Face and genitals (private parts)  with your normal soap.   Wash thoroughly, paying special attention to the area where your surgery will be performed.  Thoroughly rinse your body with warm water from the neck down.  DO NOT shower/wash with your normal soap after using and rinsing off the CHG Soap.  Pat yourself dry with a CLEAN TOWEL.  Wear CLEAN PAJAMAS to bed the night before surgery  Place CLEAN SHEETS on your bed the night before your surgery  DO NOT SLEEP WITH PETS.   Day of Surgery: Take a shower with CHG soap. Do not wear jewelry or makeup Do not wear lotions, powders, perfumes, or deodorant. Do not shave 48 hours prior to surgery.   Do not bring valuables to the hospital.  [facility] is not responsible for any belongings or valuables. Do not wear nail polish, gel polish, artificial nails, or any other type of covering on natural nails (fingers and toes) If you have artificial nails or gel coating that need to be removed by a nail salon, please have this removed prior to surgery. Artificial nails or gel coating may interfere with anesthesia's ability to adequately monitor your vital signs. Wear Clean/Comfortable clothing the morning of surgery Remember to brush your teeth WITH YOUR REGULAR TOOTHPASTE.   Please read over the following fact sheets that you were given.    If you received a COVID test during your pre-op visit  it is requested that you wear a mask when out in public, stay away from anyone that may not be feeling well and notify your surgeon if you develop symptoms. If you have been in contact with anyone that has tested positive in the last 10 days please notify you surgeon.

## 2022-01-05 ENCOUNTER — Encounter (HOSPITAL_COMMUNITY): Payer: Self-pay

## 2022-01-05 ENCOUNTER — Other Ambulatory Visit: Payer: Self-pay

## 2022-01-05 ENCOUNTER — Encounter (HOSPITAL_COMMUNITY)
Admission: RE | Admit: 2022-01-05 | Discharge: 2022-01-05 | Disposition: A | Discharge status: Home / Self Care | Payer: Medicare HMO | Source: Ambulatory Visit | Attending: Neurosurgery | Admitting: Neurosurgery

## 2022-01-05 VITALS — BP 130/82 | HR 66 | Temp 98.1°F | Resp 17 | Ht 63.0 in | Wt 111.6 lb

## 2022-01-05 DIAGNOSIS — J449 Chronic obstructive pulmonary disease, unspecified: Secondary | ICD-10-CM | POA: Insufficient documentation

## 2022-01-05 DIAGNOSIS — K7460 Unspecified cirrhosis of liver: Secondary | ICD-10-CM | POA: Diagnosis not present

## 2022-01-05 DIAGNOSIS — Z01812 Encounter for preprocedural laboratory examination: Secondary | ICD-10-CM | POA: Diagnosis present

## 2022-01-05 DIAGNOSIS — I129 Hypertensive chronic kidney disease with stage 1 through stage 4 chronic kidney disease, or unspecified chronic kidney disease: Secondary | ICD-10-CM | POA: Insufficient documentation

## 2022-01-05 DIAGNOSIS — Z79899 Other long term (current) drug therapy: Secondary | ICD-10-CM | POA: Insufficient documentation

## 2022-01-05 DIAGNOSIS — Z7982 Long term (current) use of aspirin: Secondary | ICD-10-CM | POA: Diagnosis not present

## 2022-01-05 DIAGNOSIS — Z7902 Long term (current) use of antithrombotics/antiplatelets: Secondary | ICD-10-CM | POA: Diagnosis not present

## 2022-01-05 DIAGNOSIS — F17210 Nicotine dependence, cigarettes, uncomplicated: Secondary | ICD-10-CM | POA: Diagnosis not present

## 2022-01-05 DIAGNOSIS — I671 Cerebral aneurysm, nonruptured: Secondary | ICD-10-CM | POA: Insufficient documentation

## 2022-01-05 DIAGNOSIS — Z8719 Personal history of other diseases of the digestive system: Secondary | ICD-10-CM

## 2022-01-05 DIAGNOSIS — N189 Chronic kidney disease, unspecified: Secondary | ICD-10-CM | POA: Diagnosis not present

## 2022-01-05 DIAGNOSIS — Z01818 Encounter for other preprocedural examination: Secondary | ICD-10-CM

## 2022-01-05 DIAGNOSIS — I4891 Unspecified atrial fibrillation: Secondary | ICD-10-CM | POA: Diagnosis not present

## 2022-01-05 HISTORY — DX: Unspecified atrial fibrillation: I48.91

## 2022-01-05 LAB — COMPREHENSIVE METABOLIC PANEL
ALT: 13 U/L (ref 0–44)
AST: 18 U/L (ref 15–41)
Albumin: 4.2 g/dL (ref 3.5–5.0)
Alkaline Phosphatase: 65 U/L (ref 38–126)
Anion gap: 12 (ref 5–15)
BUN: 9 mg/dL (ref 8–23)
CO2: 28 mmol/L (ref 22–32)
Calcium: 9.2 mg/dL (ref 8.9–10.3)
Chloride: 96 mmol/L — ABNORMAL LOW (ref 98–111)
Creatinine, Ser: 1.03 mg/dL — ABNORMAL HIGH (ref 0.44–1.00)
GFR, Estimated: 58 mL/min — ABNORMAL LOW (ref >60)
Glucose, Bld: 98 mg/dL (ref 70–99)
Potassium: 3.8 mmol/L (ref 3.5–5.1)
Sodium: 136 mmol/L (ref 135–145)
Total Bilirubin: 2 mg/dL — ABNORMAL HIGH (ref 0.3–1.2)
Total Protein: 6.7 g/dL (ref 6.5–8.1)

## 2022-01-05 LAB — CBC
HCT: 33.7 % — ABNORMAL LOW (ref 36.0–46.0)
Hemoglobin: 12.6 g/dL (ref 12.0–15.0)
MCH: 32.2 pg (ref 26.0–34.0)
MCHC: 37.4 g/dL — ABNORMAL HIGH (ref 30.0–36.0)
MCV: 86.2 fL (ref 80.0–100.0)
Platelets: 180 10*3/uL (ref 150–400)
RBC: 3.91 MIL/uL (ref 3.87–5.11)
RDW: 12.7 % (ref 11.5–15.5)
WBC: 5.6 10*3/uL (ref 4.0–10.5)
nRBC: 0 % (ref 0.0–0.2)

## 2022-01-05 NOTE — Progress Notes (Signed)
PCP - Dr. Michae Kava Cardiologist - Dr. Rex Kras  PPM/ICD - denies   Chest x-ray - 10/25/20 EKG - 03/23/21 Stress Test - 03/13/21 ECHO - 10/10/20 Cardiac Cath - denies  Sleep Study - denies   DM- denies  ASA/Blood Thinner Instructions: Pt states she was instructed to take ASA and Brilinta thru day of surgery   ERAS Protcol - yes, no drink   COVID TEST- n/a   Anesthesia review: yes, cardiac hx  Patient denies shortness of breath, fever, cough and chest pain at PAT appointment   All instructions explained to the patient, with a verbal understanding of the material. Patient agrees to go over the instructions while at home for a better understanding. The opportunity to ask questions was provided.

## 2022-01-08 NOTE — Anesthesia Preprocedure Evaluation (Signed)
Anesthesia Evaluation  Patient identified by MRN, date of birth, ID band Patient awake    Reviewed: Allergy & Precautions, NPO status , Patient's Chart, lab work & pertinent test results  Airway Mallampati: II  TM Distance: >3 FB Neck ROM: Full    Dental no notable dental hx.    Pulmonary COPD, Current Smoker,  Severe pulmonary HTN   Pulmonary exam normal breath sounds clear to auscultation       Cardiovascular hypertension, Pt. on medications and Pt. on home beta blockers + dysrhythmias Atrial Fibrillation + Valvular Problems/Murmurs AS and MR  Rhythm:Regular Rate:Normal + Systolic murmurs EF 82% from [place]  Echo 10/10/20: 1. Left ventricular ejection fraction, by estimation, is 55 to 60%. The left ventricle has normal function. The left ventricle has no regional wall motion abnormalities. There is moderate asymmetric hypertrophy of the  basal septal segment. The rest of the LV segments demonstrate mild left ventricular hypertrophy. Left ventricular diastolic parameters are consistent with Grade I diastolic dysfunction (impaired relaxation). Elevated left atrial pressure.  2. Right ventricular systolic function is normal. The right ventricular size is mildly enlarged. There is severely elevated pulmonary artery systolic pressure. The estimated right ventricular systolic pressure is 95.6 mmHg.  3. Left atrial size was severely dilated.  4. Right atrial size was mildly dilated.  5. The mitral valve is abnormal. There are mildly elevated gradients with flow acceleration noted across the mitral valve with mean gradient 70mHg at HR 77bpm. MVA by continuity 2.7cm2 with no significant mitral stenosis. Mild mitral valve regurgitation.  6. The aortic valve is tricuspid. There is mild calcification of the aortic valve. There is moderate thickening of the aortic valve. The LMcArthurappears fixed with trivial AR. Mild to moderate aortic valve  sclerosis/calcification is present, without any evidence of aortic stenosis.  7. The inferior vena cava is normal in size with greater than 50% respiratory variability, suggesting right atrial pressure of 3 mmHg.  - Comparison(s): Compared to prior TTE in 2016, there is now severe pulmonary HTN present with    Neuro/Psych basilar artery aneurysm  negative psych ROS   GI/Hepatic negative GI ROS, (+) Cirrhosis       ,   Endo/Other  Hypothyroidism   Renal/GU negative Renal ROS  negative genitourinary   Musculoskeletal negative musculoskeletal ROS (+)   Abdominal   Peds negative pediatric ROS (+)  Hematology negative hematology ROS (+)   Anesthesia Other Findings   Reproductive/Obstetrics negative OB ROS                            Anesthesia Physical Anesthesia Plan  ASA: 4  Anesthesia Plan: General   Post-op Pain Management: Minimal or no pain anticipated   Induction: Intravenous  PONV Risk Score and Plan: 2 and Ondansetron, Dexamethasone and Treatment may vary due to age or medical condition  Airway Management Planned: Oral ETT  Additional Equipment: Arterial line  Intra-op Plan:   Post-operative Plan: Extubation in OR  Informed Consent: I have reviewed the patients History and Physical, chart, labs and discussed the procedure including the risks, benefits and alternatives for the proposed anesthesia with the patient or authorized representative who has indicated his/her understanding and acceptance.     Dental advisory given  Plan Discussed with: CRNA and Surgeon  Anesthesia Plan Comments: (See APP note by ADurel Salts FNP )       Anesthesia Quick Evaluation

## 2022-01-08 NOTE — Progress Notes (Signed)
Anesthesia Chart Review:   Case: [id number] Date/Time: 01/09/22 1115   Procedure: Aneurysm embolization   Anesthesia type: General   Pre-op diagnosis: Brain Aneurysm I67.1   Location: [facility] OR ROOM 18 / [place] [facility]   Surgeons: Consuella Lose, MD       DISCUSSION: Pt is 71 years old with hx HTN, atrial fibrillation, heart murmur, liver cirrhosis, GI bleed, CKD, COPD, basilar artery aneurysm  Current smoker.   Brilinta and ASA to be continued perioperatively    VS: BP 130/82   Pulse 66   Temp 36.7 C   Resp 17   Ht '5\' 3"'$  (1.6 m)   Wt 50.6 kg   SpO2 100%   BMI 19.77 kg/m   PROVIDERS: - PCP is Margretta Sidle, MD - Cardiologist is [employer], DO. Last office visit 07/25/21   LABS: Labs reviewed: Acceptable for surgery. (all labs ordered are listed, but only abnormal results are displayed)  Labs Reviewed  COMPREHENSIVE METABOLIC PANEL - Abnormal; Notable for the following components:      Result Value   Chloride 96 (*)    Creatinine, Ser 1.03 (*)    Total Bilirubin 2.0 (*)    GFR, Estimated 58 (*)    All other components within normal limits  CBC - Abnormal; Notable for the following components:   HCT 33.7 (*)    MCHC 37.4 (*)    All other components within normal limits    EKG 03/23/21: NSR, 75 bpm, with PVCs, consider old anteroseptal infarct, without underlying ischemia or injury pattern   CV: Nuclear stress test 03/13/21:  - Stress EKG is non-diagnostic, as this is pharmacological stress test using Lexiscan. PVCS noted during rest, stress, and recovery. - SPECT images notes small size, mildly reduced tracer uptake within the mid anterolateral segment, more pronounced on stress images.  This is likely due to increased gastric uptake on stress images compared to rest but ischemia cannot be excluded. Otherwise, no convincing evidence of reversible myocardial ischemia or prior infarct. - Calculated LVEF 44%, visually appears preserved.  - Low risk study, clinical correlation  requested  Echo 10/10/20: 1. Left ventricular ejection fraction, by estimation, is 55 to 60%. The left ventricle has normal function. The left ventricle has no regional wall motion abnormalities. There is moderate asymmetric hypertrophy of the  basal septal segment. The rest of the LV segments demonstrate mild left ventricular hypertrophy. Left ventricular diastolic parameters are consistent with Grade I diastolic dysfunction (impaired relaxation). Elevated left atrial pressure.  2. Right ventricular systolic function is normal. The right ventricular size is mildly enlarged. There is severely elevated pulmonary artery systolic pressure. The estimated right ventricular systolic pressure is 30.1 mmHg.  3. Left atrial size was severely dilated.  4. Right atrial size was mildly dilated.  5. The mitral valve is abnormal. There are mildly elevated gradients with flow acceleration noted across the mitral valve with mean gradient 38mHg at HR 77bpm. MVA by continuity 2.7cm2 with no significant mitral stenosis. Mild mitral valve regurgitation.  6. The aortic valve is tricuspid. There is mild calcification of the aortic valve. There is moderate thickening of the aortic valve. The LCraneappears fixed with trivial AR. Mild to moderate aortic valve sclerosis/calcification is present, without any evidence of aortic stenosis.  7. The inferior vena cava is normal in size with greater than 50% respiratory variability, suggesting right atrial pressure of 3 mmHg.  - Comparison(s): Compared to prior TTE in 2016, there is now severe pulmonary HTN present  with PASP 38mHg (previously 260mg).  Past Medical History:  Diagnosis Date   ACROCHORDON 03/16/2010   Arthritis    Atrial fibrillation (HCC)    Chronic kidney disease    III   COPD (chronic obstructive pulmonary disease) (HCC)    GI bleeding 06/2018   Heart murmur    pt had echo 10/10/20   Hypertension    Hypothyroidism    [place]iver cirrhosis ([place]   Multiple  duodenal ulcers 07/16/2018   associated with melena, anemia, gastritis. due to NSAID.  hx gastric ulcers and bleeding 2004, 2007   Thyroid disease    S/p Radiation    Past Surgical History:  Procedure Laterality Date   BIOPSY  07/16/2018   Procedure: BIOPSY;  Surgeon: NaMauri PoleMD;  [facility];  Service: Endoscopy;;   BREAST EXCISIONAL BIOPSY Left    benign   CHOLECYSTECTOMY N/A 12/07/2015   Procedure: LAPAROSCOPIC CHOLECYSTECTOMY WITH INTRAOPERATIVE CHOLANGIOGRAM;  Surgeon: DaAlphonsa OverallMD;  Location[facility];  Service: General;  Laterality: N/A;   COLONOSCOPY     ERCP N/A 12/06/2015   Procedure: ENDOSCOPIC RETROGRADE CHOLANGIOPANCREATOGRAPHY (ERCP);  Surgeon: DaMilus BanisterMD;  [facility];  Service: Endoscopy;  Laterality: N/A;   ESOPHAGOGASTRODUODENOSCOPY (EGD) WITH PROPOFOL N/A 07/16/2018   Procedure: ESOPHAGOGASTRODUODENOSCOPY (EGD) WITH PROPOFOL;  Surgeon: NaMauri PoleMD;  [facility];  Service: Endoscopy;  Laterality: N/A;   IM NAILING TIBIA Right 07/19/2012   DR ALMaureen Ralphs IR ANGIO INTRA EXTRACRAN SEL COM CAROTID INNOMINATE BILAT MOD SED  12/12/2021   IR ANGIO VERTEBRAL SEL VERTEBRAL UNI L MOD SED  12/12/2021   IR VERTEBROPLASTY CERV/THOR BX INC UNI/BIL INC/INJECT/IMAGING  10/17/2020   IR VERTEBROPLASTY EA ADDL (T&LS) BX INC UNI/BIL INC INJECT/IMAGING  10/17/2020   SHOULDER HEMI-ARTHROPLASTY Right 07/22/2013   Procedure: RIGHT SHOULDER HEMI-ARTHROPLASTY;  Surgeon: StAugustin SchoolingMD;  [facility]: [facility]ics;  Laterality: Right;   TIBIA IM NAIL INSERTION Right 07/19/2013   Procedure: INTRAMEDULLARY (IM) NAIL TIBIAL;  Surgeon: FrGearlean AlfMD;  [facility] Service: [facility]opedics;  Laterality: Right;   TUBAL LIGATION     UPPER GASTROINTESTINAL ENDOSCOPY      MEDICATIONS:  albuterol (VENTOLIN HFA) 108 (90 Base) MCG/ACT inhaler   aspirin EC 81 MG tablet   B Complex Vitamins (B COMPLEX 1 PO)    BRILINTA 90 MG TABS tablet   calcium carbonate (OSCAL) 1500 (600 Ca) MG TABS tablet   citalopram (CELEXA) 20 MG tablet   diclofenac Sodium (VOLTAREN) 1 % GEL   folic acid (FOLVITE) 80341CG tablet   gabapentin (NEURONTIN) 100 MG capsule   hydrALAZINE (APRESOLINE) 25 MG tablet   ipratropium-albuterol (DUONEB) 0.5-2.5 (3) MG/3ML SOLN   levothyroxine (SYNTHROID) 125 MCG tablet   lisinopril (ZESTRIL) 40 MG tablet   metoprolol succinate (TOPROL-XL) 50 MG 24 hr tablet   Multiple Vitamin (MULTIVITAMIN WITH MINERALS) TABS tablet   pantoprazole (PROTONIX) 40 MG tablet   No current facility-administered medications for this encounter.   If no changes, I anticipate pt can proceed with surgery as scheduled.   AnWilleen CassPhD, FNP-BC [facility]/An[facility]iology Phone: ([phone]/17/2023 9:48 AM

## 2022-01-09 ENCOUNTER — Inpatient Hospital Stay (HOSPITAL_COMMUNITY)
Admission: RE | Admit: 2022-01-09 | Discharge: 2022-01-09 | Disposition: A | Discharge status: Home / Self Care | Payer: Medicare HMO | Source: Ambulatory Visit | Attending: Neurosurgery | Admitting: Neurosurgery

## 2022-01-09 ENCOUNTER — Inpatient Hospital Stay (HOSPITAL_COMMUNITY)
Admission: RE | Admit: 2022-01-09 | Discharge: 2022-01-10 | DRG: 272 | Disposition: A | Discharge status: Home / Self Care | Payer: Medicare HMO | Source: Ambulatory Visit | Attending: Neurosurgery | Admitting: Neurosurgery

## 2022-01-09 ENCOUNTER — Encounter (HOSPITAL_COMMUNITY): Payer: Self-pay | Admitting: Neurosurgery

## 2022-01-09 ENCOUNTER — Inpatient Hospital Stay (HOSPITAL_COMMUNITY): Payer: Medicare HMO | Admitting: Emergency Medicine

## 2022-01-09 ENCOUNTER — Other Ambulatory Visit: Payer: Self-pay

## 2022-01-09 ENCOUNTER — Encounter (HOSPITAL_COMMUNITY)
Admission: RE | Disposition: A | Discharge status: Home / Self Care | Payer: Self-pay | Source: Ambulatory Visit | Attending: Neurosurgery

## 2022-01-09 DIAGNOSIS — E039 Hypothyroidism, unspecified: Secondary | ICD-10-CM | POA: Diagnosis present

## 2022-01-09 DIAGNOSIS — I671 Cerebral aneurysm, nonruptured: Secondary | ICD-10-CM

## 2022-01-09 DIAGNOSIS — E1122 Type 2 diabetes mellitus with diabetic chronic kidney disease: Secondary | ICD-10-CM | POA: Diagnosis present

## 2022-01-09 DIAGNOSIS — Z923 Personal history of irradiation: Secondary | ICD-10-CM

## 2022-01-09 DIAGNOSIS — K7460 Unspecified cirrhosis of liver: Secondary | ICD-10-CM | POA: Diagnosis present

## 2022-01-09 DIAGNOSIS — J449 Chronic obstructive pulmonary disease, unspecified: Secondary | ICD-10-CM

## 2022-01-09 DIAGNOSIS — Z9049 Acquired absence of other specified parts of digestive tract: Secondary | ICD-10-CM

## 2022-01-09 DIAGNOSIS — Z7982 Long term (current) use of aspirin: Secondary | ICD-10-CM | POA: Diagnosis not present

## 2022-01-09 DIAGNOSIS — I725 Aneurysm of other precerebral arteries: Principal | ICD-10-CM | POA: Diagnosis present

## 2022-01-09 DIAGNOSIS — I4891 Unspecified atrial fibrillation: Secondary | ICD-10-CM | POA: Diagnosis present

## 2022-01-09 DIAGNOSIS — Z7902 Long term (current) use of antithrombotics/antiplatelets: Secondary | ICD-10-CM | POA: Diagnosis not present

## 2022-01-09 DIAGNOSIS — N183 Chronic kidney disease, stage 3 unspecified: Secondary | ICD-10-CM | POA: Diagnosis present

## 2022-01-09 DIAGNOSIS — M1990 Unspecified osteoarthritis, unspecified site: Secondary | ICD-10-CM | POA: Diagnosis present

## 2022-01-09 DIAGNOSIS — Z96611 Presence of right artificial shoulder joint: Secondary | ICD-10-CM | POA: Diagnosis present

## 2022-01-09 DIAGNOSIS — F17210 Nicotine dependence, cigarettes, uncomplicated: Secondary | ICD-10-CM

## 2022-01-09 DIAGNOSIS — I129 Hypertensive chronic kidney disease with stage 1 through stage 4 chronic kidney disease, or unspecified chronic kidney disease: Secondary | ICD-10-CM | POA: Diagnosis present

## 2022-01-09 DIAGNOSIS — Z79890 Hormone replacement therapy (postmenopausal): Secondary | ICD-10-CM

## 2022-01-09 DIAGNOSIS — Z79899 Other long term (current) drug therapy: Secondary | ICD-10-CM | POA: Diagnosis not present

## 2022-01-09 DIAGNOSIS — I10 Essential (primary) hypertension: Secondary | ICD-10-CM

## 2022-01-09 HISTORY — PX: IR NEURO EACH ADD'L AFTER BASIC UNI LEFT (MS): IMG5373

## 2022-01-09 HISTORY — PX: IR ANGIO VERTEBRAL SEL VERTEBRAL UNI L MOD SED: IMG5367

## 2022-01-09 HISTORY — PX: IR TRANSCATH/EMBOLIZ: IMG695

## 2022-01-09 HISTORY — PX: RADIOLOGY WITH ANESTHESIA: SHX6223

## 2022-01-09 HISTORY — PX: IR ANGIOGRAM FOLLOW UP STUDY: IMG697

## 2022-01-09 LAB — MRSA NEXT GEN BY PCR, NASAL: MRSA by PCR Next Gen: NOT DETECTED

## 2022-01-09 SURGERY — IR WITH ANESTHESIA
Anesthesia: General

## 2022-01-09 MED ORDER — FENTANYL CITRATE (PF) 100 MCG/2ML IJ SOLN
INTRAMUSCULAR | Status: AC
  Filled 2022-01-09: qty 2

## 2022-01-09 MED ORDER — GABAPENTIN 100 MG PO CAPS
200.0000 mg | ORAL_CAPSULE | Freq: Two times a day (BID) | ORAL | Status: DC
  Administered 2022-01-09 – 2022-01-10 (×2): 200 mg via ORAL
  Filled 2022-01-09 (×2): qty 2

## 2022-01-09 MED ORDER — ASPIRIN 81 MG PO TBEC
81.0000 mg | DELAYED_RELEASE_TABLET | Freq: Every day | ORAL | Status: DC
  Administered 2022-01-10: 81 mg via ORAL
  Filled 2022-01-09: qty 1

## 2022-01-09 MED ORDER — ONDANSETRON HCL 4 MG PO TABS: 4.0000 mg | ORAL_TABLET | ORAL | Status: DC | PRN

## 2022-01-09 MED ORDER — ROCURONIUM BROMIDE 10 MG/ML (PF) SYRINGE
PREFILLED_SYRINGE | INTRAVENOUS | Status: DC | PRN
  Administered 2022-01-09 (×2): 20 mg via INTRAVENOUS
  Administered 2022-01-09: 70 mg via INTRAVENOUS

## 2022-01-09 MED ORDER — GLYCOPYRROLATE PF 0.2 MG/ML IJ SOSY
PREFILLED_SYRINGE | INTRAMUSCULAR | Status: DC | PRN
  Administered 2022-01-09: .2 mg via INTRAVENOUS

## 2022-01-09 MED ORDER — SODIUM CHLORIDE 0.9 % IV SOLN: INTRAVENOUS | Status: DC

## 2022-01-09 MED ORDER — CHLORHEXIDINE GLUCONATE 0.12 % MT SOLN
15.0000 mL | Freq: Once | OROMUCOSAL | Status: AC
  Administered 2022-01-09: 15 mL via OROMUCOSAL
  Filled 2022-01-09: qty 15

## 2022-01-09 MED ORDER — FENTANYL CITRATE (PF) 100 MCG/2ML IJ SOLN
25.0000 ug | INTRAMUSCULAR | Status: DC | PRN
  Administered 2022-01-09: 25 ug via INTRAVENOUS

## 2022-01-09 MED ORDER — ACETAMINOPHEN 10 MG/ML IV SOLN
INTRAVENOUS | Status: AC
  Filled 2022-01-09: qty 100

## 2022-01-09 MED ORDER — HEPARIN SODIUM (PORCINE) 1000 UNIT/ML IJ SOLN
INTRAMUSCULAR | Status: DC | PRN
  Administered 2022-01-09: 1000 [IU] via INTRAVENOUS
  Administered 2022-01-09: 5000 [IU] via INTRAVENOUS

## 2022-01-09 MED ORDER — PHENYLEPHRINE HCL-NACL 20-0.9 MG/250ML-% IV SOLN
INTRAVENOUS | Status: DC | PRN
  Administered 2022-01-09: 50 ug/min via INTRAVENOUS

## 2022-01-09 MED ORDER — LIDOCAINE 2% (20 MG/ML) 5 ML SYRINGE
INTRAMUSCULAR | Status: DC | PRN
  Administered 2022-01-09: 100 mg via INTRAVENOUS

## 2022-01-09 MED ORDER — DEXAMETHASONE SODIUM PHOSPHATE 10 MG/ML IJ SOLN
INTRAMUSCULAR | Status: DC | PRN
  Administered 2022-01-09: 10 mg via INTRAVENOUS

## 2022-01-09 MED ORDER — FOLIC ACID 1 MG PO TABS
1.0000 mg | ORAL_TABLET | Freq: Every day | ORAL | Status: DC
  Filled 2022-01-09: qty 1

## 2022-01-09 MED ORDER — HYDROCODONE-ACETAMINOPHEN 5-325 MG PO TABS
1.0000 | ORAL_TABLET | ORAL | Status: DC | PRN
  Administered 2022-01-09: 1 via ORAL
  Filled 2022-01-09: qty 1

## 2022-01-09 MED ORDER — LACTATED RINGERS IV SOLN: INTRAVENOUS | Status: DC | PRN

## 2022-01-09 MED ORDER — IOHEXOL 300 MG/ML  SOLN
100.0000 mL | Freq: Once | INTRAMUSCULAR | Status: AC | PRN
  Administered 2022-01-09: 10 mL via INTRA_ARTERIAL

## 2022-01-09 MED ORDER — EPHEDRINE SULFATE-NACL 50-0.9 MG/10ML-% IV SOSY
PREFILLED_SYRINGE | INTRAVENOUS | Status: DC | PRN
  Administered 2022-01-09 (×2): 5 mg via INTRAVENOUS

## 2022-01-09 MED ORDER — ADULT MULTIVITAMIN W/MINERALS CH
1.0000 | ORAL_TABLET | Freq: Every day | ORAL | Status: DC
  Filled 2022-01-09: qty 1

## 2022-01-09 MED ORDER — CALCIUM CARBONATE 1250 (500 CA) MG PO TABS
500.0000 mg | ORAL_TABLET | Freq: Every day | ORAL | Status: DC
  Filled 2022-01-09 (×2): qty 1

## 2022-01-09 MED ORDER — SUGAMMADEX SODIUM 200 MG/2ML IV SOLN
INTRAVENOUS | Status: DC | PRN
  Administered 2022-01-09: 200 mg via INTRAVENOUS

## 2022-01-09 MED ORDER — HYDRALAZINE HCL 50 MG PO TABS
75.0000 mg | ORAL_TABLET | Freq: Three times a day (TID) | ORAL | Status: DC
  Administered 2022-01-09 – 2022-01-10 (×2): 75 mg via ORAL
  Filled 2022-01-09 (×2): qty 1

## 2022-01-09 MED ORDER — ALBUTEROL SULFATE (2.5 MG/3ML) 0.083% IN NEBU
3.0000 mL | INHALATION_SOLUTION | RESPIRATORY_TRACT | Status: DC | PRN
Start: 2022-01-09 — End: 2022-01-10

## 2022-01-09 MED ORDER — IPRATROPIUM-ALBUTEROL 0.5-2.5 (3) MG/3ML IN SOLN
3.0000 mL | Freq: Four times a day (QID) | RESPIRATORY_TRACT | Status: DC | PRN
Start: 2022-01-09 — End: 2022-01-10

## 2022-01-09 MED ORDER — MORPHINE SULFATE (PF) 2 MG/ML IV SOLN: 1.0000 mg | INTRAVENOUS | Status: DC | PRN

## 2022-01-09 MED ORDER — LABETALOL HCL 5 MG/ML IV SOLN
10.0000 mg | INTRAVENOUS | Status: DC | PRN
  Administered 2022-01-09: 20 mg via INTRAVENOUS
  Filled 2022-01-09: qty 4

## 2022-01-09 MED ORDER — PANTOPRAZOLE SODIUM 40 MG PO TBEC
40.0000 mg | DELAYED_RELEASE_TABLET | Freq: Every day | ORAL | Status: DC
  Administered 2022-01-09: 40 mg via ORAL
  Filled 2022-01-09: qty 1

## 2022-01-09 MED ORDER — ACETAMINOPHEN 10 MG/ML IV SOLN
1000.0000 mg | Freq: Once | INTRAVENOUS | Status: DC | PRN
  Administered 2022-01-09: 1000 mg via INTRAVENOUS

## 2022-01-09 MED ORDER — CITALOPRAM HYDROBROMIDE 10 MG PO TABS
20.0000 mg | ORAL_TABLET | Freq: Every day | ORAL | Status: DC
  Administered 2022-01-09 – 2022-01-10 (×2): 20 mg via ORAL
  Filled 2022-01-09 (×2): qty 2

## 2022-01-09 MED ORDER — IOHEXOL 300 MG/ML  SOLN
100.0000 mL | Freq: Once | INTRAMUSCULAR | Status: AC | PRN
  Administered 2022-01-09: 100 mL via INTRA_ARTERIAL

## 2022-01-09 MED ORDER — HYDRALAZINE HCL 20 MG/ML IJ SOLN
10.0000 mg | INTRAMUSCULAR | Status: DC | PRN
Start: 2022-01-09 — End: 2022-01-10
  Administered 2022-01-10 (×2): 10 mg via INTRAVENOUS
  Filled 2022-01-09 (×2): qty 1

## 2022-01-09 MED ORDER — PHENYLEPHRINE 80 MCG/ML (10ML) SYRINGE FOR IV PUSH (FOR BLOOD PRESSURE SUPPORT)
PREFILLED_SYRINGE | INTRAVENOUS | Status: DC | PRN
  Administered 2022-01-09 (×2): 160 ug via INTRAVENOUS

## 2022-01-09 MED ORDER — FENTANYL CITRATE (PF) 250 MCG/5ML IJ SOLN
INTRAMUSCULAR | Status: DC | PRN
  Administered 2022-01-09 (×2): 25 ug via INTRAVENOUS
  Administered 2022-01-09: 50 ug via INTRAVENOUS

## 2022-01-09 MED ORDER — TICAGRELOR 90 MG PO TABS
90.0000 mg | ORAL_TABLET | Freq: Two times a day (BID) | ORAL | Status: DC
  Administered 2022-01-10: 90 mg via ORAL
  Filled 2022-01-09: qty 1

## 2022-01-09 MED ORDER — METOPROLOL SUCCINATE ER 50 MG PO TB24
50.0000 mg | ORAL_TABLET | Freq: Every day | ORAL | Status: DC
  Filled 2022-01-09: qty 1

## 2022-01-09 MED ORDER — ORAL CARE MOUTH RINSE: 15.0000 mL | Freq: Once | OROMUCOSAL | Status: AC

## 2022-01-09 MED ORDER — ONDANSETRON HCL 4 MG/2ML IJ SOLN: 4.0000 mg | Freq: Once | INTRAMUSCULAR | Status: DC | PRN

## 2022-01-09 MED ORDER — LACTATED RINGERS IV SOLN: INTRAVENOUS | Status: DC

## 2022-01-09 MED ORDER — LISINOPRIL 20 MG PO TABS
40.0000 mg | ORAL_TABLET | Freq: Every day | ORAL | Status: DC
  Administered 2022-01-10: 40 mg via ORAL
  Filled 2022-01-09: qty 2

## 2022-01-09 MED ORDER — CHLORHEXIDINE GLUCONATE CLOTH 2 % EX PADS
6.0000 | MEDICATED_PAD | Freq: Every day | CUTANEOUS | Status: DC
Start: 2022-01-10 — End: 2022-01-10

## 2022-01-09 MED ORDER — LEVOTHYROXINE SODIUM 25 MCG PO TABS
125.0000 ug | ORAL_TABLET | Freq: Every day | ORAL | Status: DC
  Administered 2022-01-10: 125 ug via ORAL
  Filled 2022-01-09: qty 1

## 2022-01-09 MED ORDER — ONDANSETRON HCL 4 MG/2ML IJ SOLN
INTRAMUSCULAR | Status: DC | PRN
  Administered 2022-01-09: 4 mg via INTRAVENOUS

## 2022-01-09 MED ORDER — ALBUMIN HUMAN 5 % IV SOLN: INTRAVENOUS | Status: DC | PRN

## 2022-01-09 MED ORDER — PROPOFOL 10 MG/ML IV BOLUS
INTRAVENOUS | Status: DC | PRN
  Administered 2022-01-09: 120 mg via INTRAVENOUS

## 2022-01-09 MED ORDER — ONDANSETRON HCL 4 MG/2ML IJ SOLN
4.0000 mg | INTRAMUSCULAR | Status: DC | PRN
Start: 2022-01-09 — End: 2022-01-10

## 2022-01-09 NOTE — Brief Op Note (Signed)
  NEUROSURGERY BRIEF OPERATIVE  NOTE   PREOP DX: Basilar aneurysm  POSTOP DX: Same  PROCEDURE: Stent-supported coil embolization of basilar aneurysm  SURGEON: Dr. Consuella Lose, MD  ANESTHESIA: GETA  APPROACH: Right trans-femoral  EBL: Minimal  SPECIMENS: None  COMPLICATIONS: None  CONDITION: Stable to recovery  FINDINGS (Full report in CanopyPACS): 1. Successful stent (Left P1-basilar) supported coil embolization of basilar apex aneurysm   Consuella Lose, MD [facility]

## 2022-01-09 NOTE — Anesthesia Procedure Notes (Signed)
Procedure Name: Intubation Date/Time: 01/09/2022 1:47 PM  Performed by: Lorie Phenix, CRNAPre-anesthesia Checklist: Patient identified, Emergency Drugs available, Suction available and Patient being monitored Patient Re-evaluated:Patient Re-evaluated prior to induction Oxygen Delivery Method: Circle system utilized Preoxygenation: Pre-oxygenation with 100% oxygen Induction Type: IV induction Ventilation: Mask ventilation without difficulty Laryngoscope Size: Mac and 3 Grade View: Grade II Tube type: Oral Tube size: 7.0 mm Number of attempts: 1 Airway Equipment and Method: Stylet Placement Confirmation: ETT inserted through vocal cords under direct vision, positive ETCO2 and breath sounds checked- equal and bilateral Secured at: 22 cm Tube secured with: Tape Dental Injury: Teeth and Oropharynx as per pre-operative assessment

## 2022-01-09 NOTE — H&P (Signed)
Chief Complaint   Aneurysm  History of Present Illness  [name] is a 71 y.o. 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 She has undergone diagnostic cerebral angiogram and she has been seen in the outpatient clinic where we discussed treatment options.  She elected to proceed with stent supported coil embolization.  She has been pretreated with aspirin and Brilinta for the last week including a dose this morning.  She notes some mild bruising but no spontaneous bleeding.  Of note, she has a hx of HTN, A-fib, COPD, cirrhosis. She is a smoker 1ppd and has FHx of brain aneurysm in her father.  Past Medical History   Past Medical History:  Diagnosis Date   ACROCHORDON 03/16/2010   Arthritis    Atrial fibrillation (HCC)    Chronic kidney disease    III   COPD (chronic obstructive pulmonary disease) (HCC)    GI bleeding 06/2018   Heart murmur    pt had echo 10/10/20   Hypertension    Hypothyroidism    Liver cirrhosis ([place])    Multiple duodenal ulcers 07/16/2018   associated with melena, anemia, gastritis. due to NSAID.  hx gastric ulcers and bleeding 2004, 2007   Thyroid disease    S/p Radiation    Past Surgical History   Past Surgical History:  Procedure Laterality Date   BIOPSY  07/16/2018   Procedure: BIOPSY;  Surgeon: Mauri Pole, MD;  Location: [place] [facility];  Service: Endoscopy;;   BREAST EXCISIONAL BIOPSY Left    benign   CHOLECYSTECTOMY N/A 12/07/2015   Procedure: LAPAROSCOPIC CHOLECYSTECTOMY WITH INTRAOPERATIVE CHOLANGIOGRAM;  Surgeon: Alphonsa Overall, MD;  [facility];  Service: General;  Laterality: N/A;   COLONOSCOPY     ERCP N/A 12/06/2015   Procedure: ENDOSCOPIC RETROGRADE CHOLANGIOPANCREATOGRAPHY (ERCP);  Surgeon: Milus Banister, MD;  [facility];  Service: Endoscopy;  Laterality: N/A;   ESOPHAGOGASTRODUODENOSCOPY (EGD) WITH PROPOFOL N/A 07/16/2018    Procedure: ESOPHAGOGASTRODUODENOSCOPY (EGD) WITH PROPOFOL;  Surgeon: Mauri Pole, MD;  Location: [place] [facility];  Service: Endoscopy;  Laterality: N/A;   IM NAILING TIBIA Right 07/19/2012   DR Maureen Ralphs   IR ANGIO INTRA EXTRACRAN SEL COM CAROTID INNOMINATE BILAT MOD SED  12/12/2021   IR ANGIO VERTEBRAL SEL VERTEBRAL UNI L MOD SED  12/12/2021   IR VERTEBROPLASTY CERV/THOR BX INC UNI/BIL INC/INJECT/IMAGING  10/17/2020   IR VERTEBROPLASTY EA ADDL (T&LS) BX INC UNI/BIL INC INJECT/IMAGING  10/17/2020   SHOULDER HEMI-ARTHROPLASTY Right 07/22/2013   Procedure: RIGHT SHOULDER HEMI-ARTHROPLASTY;  Surgeon: Augustin Schooling, MD;  Location: [place];  Service: Orthopedics;  Laterality: Right;   TIBIA IM NAIL INSERTION Right 07/19/2013   Procedure: INTRAMEDULLARY (IM) NAIL TIBIAL;  Surgeon: Gearlean Alf, MD;  Location: [place];  Service: Orthopedics;  Laterality: Right;   TUBAL LIGATION     UPPER GASTROINTESTINAL ENDOSCOPY      Social History   Social History   Tobacco Use   Smoking status: Every Day    Packs/day: 1.00    Years: 40.00    Total pack years: 40.00    Types: Cigarettes   Smokeless tobacco: Never  Vaping Use   Vaping Use: Never used  Substance Use Topics   Alcohol use: Not Currently   Drug use: No    Medications   Prior to Admission medications   Medication Sig Start Date End Date Taking? Authorizing Provider  albuterol (VENTOLIN HFA) 108 (90 Base) MCG/ACT  inhaler Inhale 2 puffs into the lungs every 4 (four) hours as needed for wheezing or shortness of breath. 06/15/21  Yes [provider]  aspirin EC 81 MG tablet Take 81 mg by mouth daily. Swallow whole.   Yes [provider]  B Complex Vitamins (B COMPLEX 1 PO) Take 1 tablet by mouth daily.   Yes [provider]  BRILINTA 90 MG TABS tablet Take 90 mg by mouth 2 (two) times daily. 12/25/21  Yes [provider]  calcium carbonate (OSCAL) 1500 (600 Ca) MG TABS tablet Take 600 mg of elemental  calcium by mouth daily with breakfast.   Yes [provider]  citalopram (CELEXA) 20 MG tablet TAKE 1 TABLET BY MOUTH EVERY DAY. Patient taking differently: Take 20 mg by mouth daily. 03/17/14  Yes Patrecia Pour, MD  folic acid (FOLVITE) 867 MCG tablet Take 800 mcg by mouth daily.   Yes [provider]  gabapentin (NEURONTIN) 100 MG capsule Take 200 mg by mouth 2 (two) times daily. 02/21/21  Yes [provider]  hydrALAZINE (APRESOLINE) 25 MG tablet Take 3 tablets (75 mg total) by mouth 3 (three) times daily. 10/18/20 08/24/23 Yes Elodia Florence., MD  ipratropium-albuterol (DUONEB) 0.5-2.5 (3) MG/3ML SOLN Take 3 mLs by nebulization every 6 (six) hours as needed. 10/27/20  Yes Shelly Coss, MD  levothyroxine (SYNTHROID) 125 MCG tablet Take 125 mcg by mouth daily before breakfast. 08/18/21  Yes [provider]  lisinopril (ZESTRIL) 40 MG tablet Take 40 mg by mouth daily. 09/26/20  Yes [provider]  metoprolol succinate (TOPROL-XL) 50 MG 24 hr tablet TAKE 1 TABLET BY MOUTH ONCE DAILY (TAKE  WITH  OR  IMMEDIATELY  FOLLOWING  A  MEAL) 08/03/21  Yes Tolia, Sunit, DO  Multiple Vitamin (MULTIVITAMIN WITH MINERALS) TABS tablet Take 1 tablet by mouth daily.   Yes [provider]  pantoprazole (PROTONIX) 40 MG tablet Take 1 tablet (40 mg total) by mouth at bedtime. 07/06/21 09/01/23 Yes Ladene Artist, MD  diclofenac Sodium (VOLTAREN) 1 % GEL Apply 2 g topically 4 (four) times daily as needed (pain). 10/18/20   Elodia Florence., MD    Allergies  No Known Allergies  Review of Systems  ROS  Neurologic Exam  Awake, alert, oriented Memory and concentration grossly intact Speech fluent, appropriate CN grossly intact Motor exam: Upper Extremities Deltoid Bicep Tricep Grip  Right 5/5 5/5 5/5 5/5  Left 5/5 5/5 5/5 5/5   Lower Extremities IP Quad PF DF EHL  Right 5/5 5/5 5/5 5/5 5/5  Left 5/5 5/5 5/5 5/5 5/5   Sensation grossly intact to  LT  Impression  - 72 y.o. female with incidentally discovered large basilar apex aneurysm and significant vascular risk factors.  Plan  -We will plan on proceeding with coil embolization of the basilar aneurysm likely with stent assistance.  I have reviewed the details of the procedure as well as the expected postoperative course and recovery at length with the patient in the office.  We have also extensively discussed the risks, benefits, and alternatives to the procedure.  All her questions today were answered and she provided informed consent to proceed.  Consuella Lose, MD [facility]

## 2022-01-09 NOTE — Anesthesia Procedure Notes (Signed)
Arterial Line Insertion Start/End7/18/2023 11:30 AM, 01/09/2022 11:35 AM Performed by: CRNA  Patient location: Pre-op. Preanesthetic checklist: patient identified, IV checked, site marked, risks and benefits discussed, surgical consent, monitors and equipment checked, pre-op evaluation, timeout performed and anesthesia consent Lidocaine 1% used for infiltration Left, radial was placed Catheter size: 20 G Hand hygiene performed  and maximum sterile barriers used   Attempts: 1 Procedure performed without using ultrasound guided technique. Following insertion, dressing applied and Biopatch. Post procedure assessment: normal  Patient tolerated the procedure well with no immediate complications.

## 2022-01-09 NOTE — Progress Notes (Signed)
Notified Dr. Kathyrn Sheriff of pt's BP > 160.  Pt's HR in the mid-low 50's, PRN labetalol was held.  Order for PRN hydralazine '10mg'$  IV Q 2 hr placed.

## 2022-01-09 NOTE — Transfer of Care (Signed)
Immediate Anesthesia Transfer of Care Note  Patient: [name]  Procedure(s) Performed: Aneurysm embolization  Patient Location: PACU  Anesthesia Type:General  Level of Consciousness: awake and alert   Airway & Oxygen Therapy: Patient Spontanous Breathing  Post-op Assessment: Report given to RN, Post -op Vital signs reviewed and stable and Patient moving all extremities X 4  Post vital signs: Reviewed and stable  Last Vitals:  Vitals Value Taken Time  BP 153/76 01/09/22 1651  Temp 36.1 C 01/09/22 1651  Pulse 62 01/09/22 1700  Resp 21 01/09/22 1700  SpO2 99 % 01/09/22 1700  Vitals shown include unvalidated device data.  Last Pain:  Vitals:   01/09/22 1018  TempSrc:   PainSc: 0-No pain         Complications: No notable events documented.

## 2022-01-09 NOTE — Sedation Documentation (Signed)
Pt transported to PACU bay 2 via stretcher accompanied by RN and CRNA. PACU RN at bedside to receive pt. Handoff complete. Right groin remains level 0 and bilateral lower distal pulses palpable. Pt drowsy but easily arousable and following commands. No s/s of distress at this time.

## 2022-01-10 ENCOUNTER — Encounter (HOSPITAL_COMMUNITY): Payer: Self-pay | Admitting: Neurosurgery

## 2022-01-10 NOTE — Anesthesia Postprocedure Evaluation (Signed)
Anesthesia Post Note  Patient: [name]  Procedure(s) Performed: Aneurysm embolization     Patient location during evaluation: PACU Anesthesia Type: General Level of consciousness: awake and alert Pain management: pain level controlled Vital Signs Assessment: post-procedure vital signs reviewed and stable Respiratory status: spontaneous breathing, nonlabored ventilation, respiratory function stable and patient connected to nasal cannula oxygen Cardiovascular status: blood pressure returned to baseline and stable Postop Assessment: no apparent nausea or vomiting Anesthetic complications: no   No notable events documented.  Last Vitals:  Vitals:   01/10/22 0500 01/10/22 0600  BP: 132/60 (!) 146/64  Pulse: (!) 55 (!) 56  Resp: 14 20  Temp:    SpO2: 92% 95%    Last Pain:  Vitals:   01/10/22 0400  TempSrc: Oral  PainSc: Asleep                 Jakia Kennebrew S

## 2022-01-10 NOTE — Plan of Care (Signed)
Problem: Education: Goal: Understanding of CV disease, CV risk reduction, and recovery process will improve 01/10/2022 1318 by Toma Copier, RN Outcome: Adequate for Discharge 01/10/2022 1318 by Toma Copier, RN Outcome: Adequate for Discharge Goal: Individualized Educational Video(s) 01/10/2022 1318 by Toma Copier, RN Outcome: Adequate for Discharge 01/10/2022 1318 by Toma Copier, RN Outcome: Adequate for Discharge   Problem: Activity: Goal: Ability to return to baseline activity level will improve 01/10/2022 1318 by Toma Copier, RN Outcome: Adequate for Discharge 01/10/2022 1318 by Toma Copier, RN Outcome: Adequate for Discharge 01/10/2022 0827 by Toma Copier, RN Outcome: Progressing   Problem: Cardiovascular: Goal: Ability to achieve and maintain adequate cardiovascular perfusion will improve 01/10/2022 1318 by Toma Copier, RN Outcome: Adequate for Discharge 01/10/2022 1318 by Toma Copier, RN Outcome: Adequate for Discharge Goal: Vascular access site(s) Level 0-1 will be maintained 01/10/2022 1318 by Toma Copier, RN Outcome: Adequate for Discharge 01/10/2022 1318 by Toma Copier, RN Outcome: Adequate for Discharge 01/10/2022 0827 by Toma Copier, RN Outcome: Progressing   Problem: Health Behavior/Discharge Planning: Goal: Ability to safely manage health-related needs after discharge will improve 01/10/2022 1318 by Toma Copier, RN Outcome: Adequate for Discharge 01/10/2022 1318 by Toma Copier, RN Outcome: Adequate for Discharge   Problem: Education: Goal: Knowledge of the prescribed therapeutic regimen will improve 01/10/2022 1318 by Toma Copier, RN Outcome: Adequate for Discharge 01/10/2022 1318 by Toma Copier, RN Outcome: Adequate for Discharge 01/10/2022 0827 by Toma Copier, RN Outcome: Progressing   Problem: Clinical Measurements: Goal: Usual level of consciousness will be regained or  maintained. 01/10/2022 1318 by Toma Copier, RN Outcome: Adequate for Discharge 01/10/2022 1318 by Toma Copier, RN Outcome: Adequate for Discharge 01/10/2022 0827 by Toma Copier, RN Outcome: Progressing Goal: Neurologic status will improve 01/10/2022 1318 by Toma Copier, RN Outcome: Adequate for Discharge 01/10/2022 1318 by Toma Copier, RN Outcome: Adequate for Discharge 01/10/2022 0827 by Toma Copier, RN Outcome: Progressing Goal: Ability to maintain intracranial pressure will improve Outcome: Adequate for Discharge   Problem: Skin Integrity: Goal: Demonstration of wound healing without infection will improve Outcome: Adequate for Discharge   Problem: Education: Goal: Knowledge of General Education information will improve Description: Including pain rating scale, medication(s)/side effects and non-pharmacologic comfort measures 01/10/2022 1318 by Toma Copier, RN Outcome: Adequate for Discharge 01/10/2022 0827 by Toma Copier, RN Outcome: Progressing   Problem: Health Behavior/Discharge Planning: Goal: Ability to manage health-related needs will improve 01/10/2022 1318 by Toma Copier, RN Outcome: Adequate for Discharge 01/10/2022 0827 by Toma Copier, RN Outcome: Progressing   Problem: Clinical Measurements: Goal: Ability to maintain clinical measurements within normal limits will improve Outcome: Adequate for Discharge Goal: Will remain free from infection Outcome: Adequate for Discharge Goal: Diagnostic test results will improve Outcome: Adequate for Discharge Goal: Respiratory complications will improve Outcome: Adequate for Discharge Goal: Cardiovascular complication will be avoided Outcome: Adequate for Discharge   Problem: Activity: Goal: Risk for activity intolerance will decrease Outcome: Adequate for Discharge   Problem: Nutrition: Goal: Adequate nutrition will be maintained Outcome: Adequate for Discharge    Problem: Coping: Goal: Level of anxiety will decrease Outcome: Adequate for Discharge   Problem: Elimination: Goal: Will not experience complications related to bowel motility Outcome: Adequate for Discharge Goal: Will not experience complications related to urinary retention Outcome: Adequate for Discharge   Problem: Pain Managment: Goal: General experience of  comfort will improve Outcome: Adequate for Discharge   Problem: Safety: Goal: Ability to remain free from injury will improve Outcome: Adequate for Discharge   Problem: Skin Integrity: Goal: Risk for impaired skin integrity will decrease Outcome: Adequate for Discharge

## 2022-01-10 NOTE — Progress Notes (Addendum)
   01/10/22 1352  AVS Discharge Documentation  AVS Discharge Instructions Including Medications Provided to patient/caregiver  Name of Person Receiving AVS Discharge Instructions Including Medications [name]  Name of Clinician That Reviewed AVS Discharge Instructions Including Medications Trudee Grip, RN   AVS instructions reviewed with patient. No further questions. All belongings returned to patient. Pt successfully voided post foley removal prior to discharge. Volunteer services called to transport patient to discharge lounge.

## 2022-01-10 NOTE — Discharge Summary (Signed)
Physician Discharge Summary  Patient ID: [name] MRN: [id number] DOB/AGE: November 23, 1950 71 y.o.  Admit date: 01/09/2022 Discharge date: 01/10/2022  Admission Diagnoses:cerebral aneurysm Basilar artery Discharge Diagnoses:  Principal Problem:   Brain aneurysm Active Problems:   Cerebral aneurysm   Discharged Condition: good  Hospital Course: [name] was admitted and taken to the the endovascular suite where she had an uncomplicated coiling of an unruptured basilar apex aneurysm. Post coiling she is doing well, ambulating, voiding, and tolerating a regular diet.   Treatments: surgery:  Stent-supported coil embolization of basilar aneurysm  Discharge Exam: Blood pressure (!) 161/71, pulse (!) 53, temperature 98.2 F (36.8 C), temperature source Oral, resp. rate 20, height '5\' 3"'$  (1.6 m), weight 53.7 kg, SpO2 96 %. General appearance: alert, cooperative, appears stated age, and no distress  Disposition: Discharge disposition: 01-Home or Self Care      Brain Aneurysm I67.1  Allergies as of 01/10/2022   No Known Allergies      Medication List     TAKE these medications    albuterol 108 (90 Base) MCG/ACT inhaler Commonly known as: VENTOLIN HFA Inhale 2 puffs into the lungs every 4 (four) hours as needed for wheezing or shortness of breath.   aspirin EC 81 MG tablet Take 81 mg by mouth daily. Swallow whole.   B COMPLEX 1 PO Take 1 tablet by mouth daily.   Brilinta 90 MG Tabs tablet Generic drug: ticagrelor Take 90 mg by mouth 2 (two) times daily.   calcium carbonate 1500 (600 Ca) MG Tabs tablet Commonly known as: OSCAL Take 600 mg of elemental calcium by mouth daily with breakfast.   citalopram 20 MG tablet Commonly known as: CELEXA TAKE 1 TABLET BY MOUTH EVERY DAY.   diclofenac Sodium 1 % Gel Commonly known as: VOLTAREN Apply 2 g topically 4 (four) times daily as needed (pain).   folic acid 379 MCG tablet Commonly known as: FOLVITE Take 800  mcg by mouth daily.   gabapentin 100 MG capsule Commonly known as: NEURONTIN Take 200 mg by mouth 2 (two) times daily.   hydrALAZINE 25 MG tablet Commonly known as: APRESOLINE Take 3 tablets (75 mg total) by mouth 3 (three) times daily.   ipratropium-albuterol 0.5-2.5 (3) MG/3ML Soln Commonly known as: DUONEB Take 3 mLs by nebulization every 6 (six) hours as needed.   levothyroxine 125 MCG tablet Commonly known as: SYNTHROID Take 125 mcg by mouth daily before breakfast.   lisinopril 40 MG tablet Commonly known as: ZESTRIL Take 40 mg by mouth daily.   metoprolol succinate 50 MG 24 hr tablet Commonly known as: TOPROL-XL TAKE 1 TABLET BY MOUTH ONCE DAILY (TAKE  WITH  OR  IMMEDIATELY  FOLLOWING  A  MEAL)   multivitamin with minerals Tabs tablet Take 1 tablet by mouth daily.   pantoprazole 40 MG tablet Commonly known as: PROTONIX Take 1 tablet (40 mg total) by mouth at bedtime.        Follow-up Information     Consuella Lose, MD Follow up.   Specialty: Neurosurgery Why: call the office to schedule or keep an already scheduled appointment Contact information: [address] [place] [address] [phone]                 Signed: Ashok Pall 01/10/2022, 1:10 PM

## 2022-01-10 NOTE — Plan of Care (Signed)
  Problem: Activity: Goal: Ability to return to baseline activity level will improve Outcome: Progressing   Problem: Cardiovascular: Goal: Vascular access site(s) Level 0-1 will be maintained Outcome: Progressing   Problem: Education: Goal: Knowledge of the prescribed therapeutic regimen will improve Outcome: Progressing   Problem: Clinical Measurements: Goal: Usual level of consciousness will be regained or maintained. Outcome: Progressing Goal: Neurologic status will improve Outcome: Progressing   Problem: Education: Goal: Knowledge of General Education information will improve Description: Including pain rating scale, medication(s)/side effects and non-pharmacologic comfort measures Outcome: Progressing   Problem: Health Behavior/Discharge Planning: Goal: Ability to manage health-related needs will improve Outcome: Progressing

## 2022-01-22 ENCOUNTER — Ambulatory Visit: Payer: Medicare Other | Admitting: Cardiology

## 2022-01-25 ENCOUNTER — Other Ambulatory Visit: Payer: Self-pay | Admitting: Cardiology

## 2022-01-25 DIAGNOSIS — I480 Paroxysmal atrial fibrillation: Secondary | ICD-10-CM

## 2022-02-13 ENCOUNTER — Ambulatory Visit: Payer: Medicare Other | Admitting: Cardiology

## 2022-03-16 ENCOUNTER — Other Ambulatory Visit: Payer: Self-pay | Admitting: Family Medicine

## 2022-03-16 ENCOUNTER — Ambulatory Visit
Admission: RE | Admit: 2022-03-16 | Discharge: 2022-03-16 | Disposition: A | Discharge status: Home / Self Care | Payer: Medicare HMO | Source: Ambulatory Visit | Attending: Family Medicine | Admitting: Family Medicine

## 2022-03-16 DIAGNOSIS — G8929 Other chronic pain: Secondary | ICD-10-CM

## 2022-03-16 DIAGNOSIS — M25511 Pain in right shoulder: Secondary | ICD-10-CM

## 2022-03-26 ENCOUNTER — Inpatient Hospital Stay: Admission: RE | Admit: 2022-03-26 | Payer: Medicare HMO | Source: Ambulatory Visit

## 2022-07-13 ENCOUNTER — Other Ambulatory Visit: Payer: Self-pay | Admitting: Adult Health

## 2022-07-13 DIAGNOSIS — M810 Age-related osteoporosis without current pathological fracture: Secondary | ICD-10-CM

## 2022-07-31 ENCOUNTER — Other Ambulatory Visit: Payer: Self-pay | Admitting: Neurosurgery

## 2022-07-31 ENCOUNTER — Other Ambulatory Visit: Payer: Self-pay | Admitting: Adult Health

## 2022-07-31 DIAGNOSIS — M810 Age-related osteoporosis without current pathological fracture: Secondary | ICD-10-CM

## 2022-07-31 DIAGNOSIS — I671 Cerebral aneurysm, nonruptured: Secondary | ICD-10-CM

## 2022-07-31 DIAGNOSIS — Z1231 Encounter for screening mammogram for malignant neoplasm of breast: Secondary | ICD-10-CM

## 2022-08-02 ENCOUNTER — Other Ambulatory Visit: Payer: Self-pay | Admitting: Neurosurgery

## 2022-08-14 ENCOUNTER — Encounter: Payer: Self-pay | Admitting: Gastroenterology

## 2022-08-21 ENCOUNTER — Inpatient Hospital Stay (HOSPITAL_COMMUNITY)
Admission: RE | Admit: 2022-08-21 | Discharge: 2022-08-21 | Disposition: A | Discharge status: Home / Self Care | Payer: Medicare HMO | Source: Ambulatory Visit | Attending: Neurosurgery | Admitting: Neurosurgery

## 2022-08-21 NOTE — H&P (Signed)
Chief Complaint   Aneurysm  History of Present Illness  [name] is a 72 y.o. female with a history of incidentally discovered basilar apex aneurysm who underwent stent-supported coiling approximately 6 months ago. She has done well clinically and presents today for routine f/u angiogram.  Past Medical History   Past Medical History:  Diagnosis Date   ACROCHORDON 03/16/2010   Arthritis    Atrial fibrillation (HCC)    Chronic kidney disease    III   COPD (chronic obstructive pulmonary disease) (HCC)    GI bleeding 06/2018   Heart murmur    pt had echo 10/10/20   Hypertension    Hypothyroidism    Liver cirrhosis ([place])    Multiple duodenal ulcers 07/16/2018   associated with melena, anemia, gastritis. due to NSAID.  hx gastric ulcers and bleeding 2004, 2007   Thyroid disease    S/p Radiation    Past Surgical History   Past Surgical History:  Procedure Laterality Date   BIOPSY  07/16/2018   Procedure: BIOPSY;  Surgeon: Mauri Pole, MD;  Location: [facility];  Service: Endoscopy;;   BREAST EXCISIONAL BIOPSY Left    benign   CHOLECYSTECTOMY N/A 12/07/2015   Procedure: LAPAROSCOPIC CHOLECYSTECTOMY WITH INTRAOPERATIVE CHOLANGIOGRAM;  Surgeon: Alphonsa Overall, MD;  Location: [facility];  Service: General;  Laterality: N/A;   COLONOSCOPY     ERCP N/A 12/06/2015   Procedure: ENDOSCOPIC RETROGRADE CHOLANGIOPANCREATOGRAPHY (ERCP);  Surgeon: Milus Banister, MD;  Location: [facility];  Service: Endoscopy;  Laterality: N/A;   ESOPHAGOGASTRODUODENOSCOPY (EGD) WITH PROPOFOL N/A 07/16/2018   Procedure: ESOPHAGOGASTRODUODENOSCOPY (EGD) WITH PROPOFOL;  Surgeon: Mauri Pole, MD;  Location: [facility];  Service: Endoscopy;  Laterality: N/A;   IM NAILING TIBIA Right 07/19/2012   DR Maureen Ralphs   IR ANGIO INTRA EXTRACRAN SEL COM CAROTID INNOMINATE BILAT MOD SED  12/12/2021   IR ANGIO VERTEBRAL SEL VERTEBRAL UNI L MOD SED  12/12/2021   IR ANGIO VERTEBRAL SEL VERTEBRAL UNI L  MOD SED  01/09/2022   IR ANGIOGRAM FOLLOW UP STUDY  01/09/2022   IR ANGIOGRAM FOLLOW UP STUDY  01/09/2022   IR ANGIOGRAM FOLLOW UP STUDY  01/09/2022   IR ANGIOGRAM FOLLOW UP STUDY  01/09/2022   IR ANGIOGRAM FOLLOW UP STUDY  01/09/2022   IR ANGIOGRAM FOLLOW UP STUDY  01/09/2022   IR ANGIOGRAM FOLLOW UP STUDY  01/09/2022   IR ANGIOGRAM FOLLOW UP STUDY  01/09/2022   IR ANGIOGRAM FOLLOW UP STUDY  01/09/2022   IR NEURO EACH ADD'L AFTER BASIC UNI LEFT (MS)  01/09/2022   IR TRANSCATH/EMBOLIZ  01/09/2022   IR VERTEBROPLASTY CERV/THOR BX INC UNI/BIL INC/INJECT/IMAGING  10/17/2020   IR VERTEBROPLASTY EA ADDL (T&LS) BX INC UNI/BIL INC INJECT/IMAGING  10/17/2020   RADIOLOGY WITH ANESTHESIA N/A 01/09/2022   Procedure: Aneurysm embolization;  Surgeon: Consuella Lose, MD;  Location: [facility];  Service: Radiology;  Laterality: N/A;   SHOULDER HEMI-ARTHROPLASTY Right 07/22/2013   Procedure: RIGHT SHOULDER HEMI-ARTHROPLASTY;  Surgeon: Augustin Schooling, MD;  Location: [facility];  Service: Orthopedics;  Laterality: Right;   TIBIA IM NAIL INSERTION Right 07/19/2013   Procedure: INTRAMEDULLARY (IM) NAIL TIBIAL;  Surgeon: Gearlean Alf, MD;  Location: [facility];  Service: Orthopedics;  Laterality: Right;   TUBAL LIGATION     UPPER GASTROINTESTINAL ENDOSCOPY      Social History   Social History   Tobacco Use   Smoking status: Every Day    Packs/day: 1.00    Years: 40.00  Total pack years: 40.00    Types: Cigarettes   Smokeless tobacco: Never  Vaping Use   Vaping Use: Never used  Substance Use Topics   Alcohol use: Not Currently   Drug use: No    Medications   Prior to Admission medications   Medication Sig Start Date End Date Taking? Authorizing Provider  albuterol (VENTOLIN HFA) 108 (90 Base) MCG/ACT inhaler Inhale 2 puffs into the lungs every 4 (four) hours as needed for wheezing or shortness of breath. 06/15/21   [provider]  aspirin EC 81 MG tablet Take 81 mg by mouth daily. Swallow whole.     [provider]  B Complex Vitamins (B COMPLEX 1 PO) Take 1 tablet by mouth daily.    [provider]  BRILINTA 90 MG TABS tablet Take 90 mg by mouth 2 (two) times daily. 12/25/21   [provider]  calcium carbonate (OSCAL) 1500 (600 Ca) MG TABS tablet Take 600 mg of elemental calcium by mouth daily with breakfast.    [provider]  citalopram (CELEXA) 20 MG tablet TAKE 1 TABLET BY MOUTH EVERY DAY. Patient taking differently: Take 20 mg by mouth daily. 03/17/14   Patrecia Pour, MD  diclofenac Sodium (VOLTAREN) 1 % GEL Apply 2 g topically 4 (four) times daily as needed (pain). 10/18/20   Elodia Florence., MD  folic acid (FOLVITE) Q000111Q MCG tablet Take 800 mcg by mouth daily.    [provider]  gabapentin (NEURONTIN) 100 MG capsule Take 200 mg by mouth 2 (two) times daily. 02/21/21   [provider]  hydrALAZINE (APRESOLINE) 25 MG tablet Take 3 tablets (75 mg total) by mouth 3 (three) times daily. 10/18/20 08/24/23  Elodia Florence., MD  ipratropium-albuterol (DUONEB) 0.5-2.5 (3) MG/3ML SOLN Take 3 mLs by nebulization every 6 (six) hours as needed. 10/27/20   Shelly Coss, MD  levothyroxine (SYNTHROID) 125 MCG tablet Take 125 mcg by mouth daily before breakfast. 08/18/21   [provider]  lisinopril (ZESTRIL) 40 MG tablet Take 40 mg by mouth daily. 09/26/20   [provider]  metoprolol succinate (TOPROL-XL) 50 MG 24 hr tablet TAKE 1 TABLET BY MOUTH ONCE DAILY WITH MEAL 01/25/22   Tolia, Sunit, DO  Multiple Vitamin (MULTIVITAMIN WITH MINERALS) TABS tablet Take 1 tablet by mouth daily.    [provider]  pantoprazole (PROTONIX) 40 MG tablet Take 1 tablet (40 mg total) by mouth at bedtime. 07/06/21 09/01/23  Ladene Artist, MD    Allergies  No Known Allergies  Review of Systems  ROS  Neurologic Exam  Awake, alert, oriented Memory and concentration grossly intact Speech fluent, appropriate CN grossly  intact Motor exam: Upper Extremities Deltoid Bicep Tricep Grip  Right 5/5 5/5 5/5 5/5  Left 5/5 5/5 5/5 5/5   Lower Extremities IP Quad PF DF EHL  Right 5/5 5/5 5/5 5/5 5/5  Left 5/5 5/5 5/5 5/5 5/5   Sensation grossly intact to LT  Impression  - 72 y.o. female >63mos/p stent-supported coil embolization of a basilar apex aneurysm, doing well neurologically   Plan  - will proceed with f/u angiogram  I have reviewed the indications for the procedure as well as the details of the procedure and the expected postoperative course and recovery at length with the patient in the office. We have also reviewed in detail the risks, benefits, and alternatives to the procedure. All questions were answered and BCorky Singprovided informed  consent to proceed.  Consuella Lose, MD [facility]

## 2022-09-06 ENCOUNTER — Ambulatory Visit: Payer: Medicare HMO

## 2022-09-06 ENCOUNTER — Other Ambulatory Visit: Payer: Medicare HMO

## 2022-09-13 ENCOUNTER — Ambulatory Visit
Admission: RE | Admit: 2022-09-13 | Discharge: 2022-09-13 | Disposition: A | Discharge status: Home / Self Care | Payer: Medicare HMO | Source: Ambulatory Visit | Attending: Adult Health | Admitting: Adult Health

## 2022-09-13 DIAGNOSIS — Z1231 Encounter for screening mammogram for malignant neoplasm of breast: Secondary | ICD-10-CM

## 2022-09-14 ENCOUNTER — Other Ambulatory Visit: Payer: Self-pay | Admitting: Neurosurgery

## 2022-09-14 ENCOUNTER — Ambulatory Visit (HOSPITAL_COMMUNITY)
Admission: RE | Admit: 2022-09-14 | Discharge: 2022-09-14 | Disposition: A | Discharge status: Home / Self Care | Payer: Medicare HMO | Source: Ambulatory Visit | Attending: Neurosurgery | Admitting: Neurosurgery

## 2022-09-14 ENCOUNTER — Other Ambulatory Visit: Payer: Self-pay

## 2022-09-14 DIAGNOSIS — I671 Cerebral aneurysm, nonruptured: Secondary | ICD-10-CM

## 2022-09-14 DIAGNOSIS — I725 Aneurysm of other precerebral arteries: Secondary | ICD-10-CM | POA: Insufficient documentation

## 2022-09-14 DIAGNOSIS — Z7982 Long term (current) use of aspirin: Secondary | ICD-10-CM | POA: Insufficient documentation

## 2022-09-14 DIAGNOSIS — F17210 Nicotine dependence, cigarettes, uncomplicated: Secondary | ICD-10-CM | POA: Diagnosis not present

## 2022-09-14 DIAGNOSIS — Z7902 Long term (current) use of antithrombotics/antiplatelets: Secondary | ICD-10-CM | POA: Insufficient documentation

## 2022-09-14 HISTORY — PX: IR ANGIO VERTEBRAL SEL VERTEBRAL UNI R MOD SED: IMG5368

## 2022-09-14 HISTORY — PX: IR US GUIDE VASC ACCESS RIGHT: IMG2390

## 2022-09-14 LAB — CBC WITH DIFFERENTIAL/PLATELET
Abs Immature Granulocytes: 0.05 10*3/uL (ref 0.00–0.07)
Basophils Absolute: 0.1 10*3/uL (ref 0.0–0.1)
Basophils Relative: 1 %
Eosinophils Absolute: 0.1 10*3/uL (ref 0.0–0.5)
Eosinophils Relative: 1 %
HCT: 35.6 % — ABNORMAL LOW (ref 36.0–46.0)
Hemoglobin: 13.3 g/dL (ref 12.0–15.0)
Immature Granulocytes: 1 %
Lymphocytes Relative: 18 %
Lymphs Abs: 1.7 10*3/uL (ref 0.7–4.0)
MCH: 33.3 pg (ref 26.0–34.0)
MCHC: 37.4 g/dL — ABNORMAL HIGH (ref 30.0–36.0)
MCV: 89 fL (ref 80.0–100.0)
Monocytes Absolute: 0.4 10*3/uL (ref 0.1–1.0)
Monocytes Relative: 4 %
Neutro Abs: 7.3 10*3/uL (ref 1.7–7.7)
Neutrophils Relative %: 75 %
Platelets: 274 10*3/uL (ref 150–400)
RBC: 4 MIL/uL (ref 3.87–5.11)
RDW: 12.8 % (ref 11.5–15.5)
WBC: 9.7 10*3/uL (ref 4.0–10.5)
nRBC: 0 % (ref 0.0–0.2)

## 2022-09-14 LAB — BASIC METABOLIC PANEL
Anion gap: 9 (ref 5–15)
BUN: 13 mg/dL (ref 8–23)
CO2: 23 mmol/L (ref 22–32)
Calcium: 8.7 mg/dL — ABNORMAL LOW (ref 8.9–10.3)
Chloride: 91 mmol/L — ABNORMAL LOW (ref 98–111)
Creatinine, Ser: 0.86 mg/dL (ref 0.44–1.00)
GFR, Estimated: >60 mL/min (ref >60)
Glucose, Bld: 100 mg/dL — ABNORMAL HIGH (ref 70–99)
Potassium: 5.4 mmol/L — ABNORMAL HIGH (ref 3.5–5.1)
Sodium: 123 mmol/L — ABNORMAL LOW (ref 135–145)

## 2022-09-14 LAB — PROTIME-INR
INR: 1 (ref 0.8–1.2)
Prothrombin Time: 12.8 seconds (ref 11.4–15.2)

## 2022-09-14 LAB — APTT: aPTT: 32 seconds (ref 24–36)

## 2022-09-14 LAB — GLUCOSE, CAPILLARY: Glucose-Capillary: 123 mg/dL — ABNORMAL HIGH (ref 70–99)

## 2022-09-14 MED ORDER — SODIUM CHLORIDE 0.9 % IV SOLN: INTRAVENOUS | Status: DC

## 2022-09-14 MED ORDER — VERAPAMIL HCL 2.5 MG/ML IV SOLN
INTRAVENOUS | Status: AC
  Filled 2022-09-14: qty 2

## 2022-09-14 MED ORDER — CHLORHEXIDINE GLUCONATE CLOTH 2 % EX PADS: 6.0000 | MEDICATED_PAD | Freq: Once | CUTANEOUS | Status: DC

## 2022-09-14 MED ORDER — CEFAZOLIN SODIUM-DEXTROSE 2-4 GM/100ML-% IV SOLN: 2.0000 g | INTRAVENOUS | Status: DC

## 2022-09-14 MED ORDER — HEPARIN SODIUM (PORCINE) 1000 UNIT/ML IJ SOLN
INTRAMUSCULAR | Status: AC | PRN
  Administered 2022-09-14: 3000 [IU] via INTRAVENOUS

## 2022-09-14 MED ORDER — MIDAZOLAM HCL 2 MG/2ML IJ SOLN
INTRAMUSCULAR | Status: AC | PRN
  Administered 2022-09-14: 1 mg via INTRAVENOUS

## 2022-09-14 MED ORDER — MIDAZOLAM HCL 2 MG/2ML IJ SOLN
INTRAMUSCULAR | Status: AC
  Filled 2022-09-14: qty 2

## 2022-09-14 MED ORDER — NITROGLYCERIN 1 MG/10 ML FOR IR/CATH LAB
INTRA_ARTERIAL | Status: AC
  Filled 2022-09-14: qty 10

## 2022-09-14 MED ORDER — HYDROCODONE-ACETAMINOPHEN 5-325 MG PO TABS
1.0000 | ORAL_TABLET | ORAL | Status: DC | PRN
  Administered 2022-09-14: 2 via ORAL
  Filled 2022-09-14: qty 2

## 2022-09-14 MED ORDER — VERAPAMIL HCL 2.5 MG/ML IV SOLN
INTRA_ARTERIAL | Status: AC | PRN
  Administered 2022-09-14: 5 mL via INTRA_ARTERIAL

## 2022-09-14 MED ORDER — FENTANYL CITRATE (PF) 100 MCG/2ML IJ SOLN
INTRAMUSCULAR | Status: AC
  Filled 2022-09-14: qty 2

## 2022-09-14 MED ORDER — IOHEXOL 300 MG/ML  SOLN
100.0000 mL | Freq: Once | INTRAMUSCULAR | Status: AC | PRN
  Administered 2022-09-14: 70 mL via INTRA_ARTERIAL

## 2022-09-14 MED ORDER — HEPARIN SODIUM (PORCINE) 1000 UNIT/ML IJ SOLN
INTRAMUSCULAR | Status: AC
  Filled 2022-09-14: qty 10

## 2022-09-14 MED ORDER — LIDOCAINE HCL 1 % IJ SOLN
INTRAMUSCULAR | Status: AC
  Filled 2022-09-14: qty 20

## 2022-09-14 MED ORDER — FENTANYL CITRATE (PF) 100 MCG/2ML IJ SOLN
INTRAMUSCULAR | Status: AC | PRN
  Administered 2022-09-14: 25 ug via INTRAVENOUS

## 2022-09-14 NOTE — Op Note (Signed)
  NEUROSURGERY BRIEF OPERATIVE  NOTE   PREOP DX: Basilar aneurysm  POSTOP DX: Same  PROCEDURE: Diagnostic cerebral angiogram  SURGEON: Dr. Consuella Lose, MD  ANESTHESIA: IV Sedation with Local  APPROACH: Right trans-radial  EBL: Minimal  SPECIMENS: None  COMPLICATIONS: None  CONDITION: Stable to recovery  FINDINGS (Full report in [facility]): 1. Occlusion of previously treated basilar apex aneurysm. Mild left P1 stenosis within the stent.   Consuella Lose, MD [facility]

## 2022-09-14 NOTE — H&P (Signed)
Chief Complaint   Aneurysm  History of Present Illness  [name] is a 72 year old woman I am seeing in follow-up now approximately 8 months status post stent supported coil embolization of the basilar apex aneurysm. Patient has done very well since then. She remains on daily aspirin and Plavix. Unfortunately she suffered a fall a few months ago and was diagnosed with more thoracic compression fractures.   Past Medical History   Past Medical History:  Diagnosis Date   A[place]CH[place]ON 03/16/2010   Arthritis    Atrial fibrillation (HCC)    Chronic kidney disease    III   COPD (chronic obstructive pulmonary disease) (HCC)    GI bleeding 06/2018   Heart murmur    pt had echo 10/10/20   Hypertension    Hypothyroidism    Liver cirrhosis ([place])    Multiple duodenal ulcers 07/16/2018   associated with melena, anemia, gastritis. due to NSAID.  hx gastric ulcers and bleeding 2004, 2007   Thyroid disease    S/p Radiation    Past Surgical History   Past Surgical History:  Procedure Laterality Date   BIOPSY  07/16/2018   Procedure: BIOPSY;  Surgeon: Mauri Pole, MD;  Location: [facility];  Service: Endoscopy;;   BREAST EXCISIONAL BIOPSY Left    benign   CHOLECYSTECTOMY N/A 12/07/2015   Procedure: LAPAROSCOPIC CHOLECYSTECTOMY WITH INTRAOPERATIVE CHOLANGIOGRAM;  Surgeon: Alphonsa Overall, MD;  Location[facility];  Service: General;  Laterality: N/A;   COLONOSCOPY     ERCP N/A 12/06/2015   Procedure: ENDOSCOPIC RETROGRADE CHOLANGIOPANCREATOGRAPHY (ERCP);  Surgeon: Milus Banister, MD;  Location: [facility];  Service: Endoscopy;  Laterality: N/A;   ESOPHAGOGASTRODUODENOSCOPY (EGD) WITH PROPOFOL N/A 07/16/2018   Procedure: ESOPHAGOGASTRODUODENOSCOPY (EGD) WITH PROPOFOL;  Surgeon: Mauri Pole, MD;  Location: [facility];  Service: Endoscopy;  Laterality: N/A;   IM NAILING TIBIA Right 07/19/2012   DR Maureen Ralphs   IR ANGIO INTRA EXTRACRAN SEL COM CAROTID INNOMINATE BILAT MOD SED   12/12/2021   IR ANGIO VERTEBRAL SEL VERTEBRAL UNI L MOD SED  12/12/2021   IR ANGIO VERTEBRAL SEL VERTEBRAL UNI L MOD SED  01/09/2022   IR ANGIOGRAM FOLLOW UP STUDY  01/09/2022   IR ANGIOGRAM FOLLOW UP STUDY  01/09/2022   IR ANGIOGRAM FOLLOW UP STUDY  01/09/2022   IR ANGIOGRAM FOLLOW UP STUDY  01/09/2022   IR ANGIOGRAM FOLLOW UP STUDY  01/09/2022   IR ANGIOGRAM FOLLOW UP STUDY  01/09/2022   IR ANGIOGRAM FOLLOW UP STUDY  01/09/2022   IR ANGIOGRAM FOLLOW UP STUDY  01/09/2022   IR ANGIOGRAM FOLLOW UP STUDY  01/09/2022   IR NEURO EACH ADD'L AFTER BASIC UNI LEFT (MS)  01/09/2022   IR TRANSCATH/EMBOLIZ  01/09/2022   IR VERTEBROPLASTY CERV/THOR BX INC UNI/BIL INC/INJECT/IMAGING  10/17/2020   IR VERTEBROPLASTY EA ADDL (T&LS) BX INC UNI/BIL INC INJECT/IMAGING  10/17/2020   RADIOLOGY WITH ANESTHESIA N/A 01/09/2022   Procedure: Aneurysm embolization;  Surgeon: Consuella Lose, MD;  Location: [facility];  Service: Radiology;  Laterality: N/A;   SHOULDER HEMI-ARTHROPLASTY Right 07/22/2013   Procedure: RIGHT SHOULDER HEMI-ARTHROPLASTY;  Surgeon: Augustin Schooling, MD;  Location: [facility];  Service: Orthopedics;  Laterality: Right;   TIBIA IM NAIL INSERTION Right 07/19/2013   Procedure: INTRAMEDULLARY (IM) NAIL TIBIAL;  Surgeon: Gearlean Alf, MD;  Location: [facility];  Service: Orthopedics;  Laterality: Right;   TUBAL LIGATION     UPPER GASTROINTESTINAL ENDOSCOPY      Social History   Social  History   Tobacco Use   Smoking status: Every Day    Packs/day: 1.00    Years: 40.00    Additional pack years: 0.00    Total pack years: 40.00    Types: Cigarettes   Smokeless tobacco: Never  Vaping Use   Vaping Use: Never used  Substance Use Topics   Alcohol use: Not Currently   Drug use: No    Medications   Prior to Admission medications   Medication Sig Start Date End Date Taking? Authorizing Provider  albuterol (VENTOLIN HFA) 108 (90 Base) MCG/ACT inhaler Inhale 2 puffs into the lungs every 4 (four) hours as needed  for wheezing or shortness of breath. 06/15/21   [provider]  aspirin EC 81 MG tablet Take 81 mg by mouth daily. Swallow whole.    [provider]  B Complex Vitamins (B COMPLEX 1 PO) Take 1 tablet by mouth daily.    [provider]  BRILINTA 90 MG TABS tablet Take 90 mg by mouth 2 (two) times daily. 12/25/21   [provider]  calcium carbonate (OSCAL) 1500 (600 Ca) MG TABS tablet Take 600 mg of elemental calcium by mouth daily with breakfast.    [provider]  citalopram (CELEXA) 20 MG tablet TAKE 1 TABLET BY MOUTH EVERY DAY. Patient taking differently: Take 20 mg by mouth daily. 03/17/14   Patrecia Pour, MD  diclofenac Sodium (VOLTAREN) 1 % GEL Apply 2 g topically 4 (four) times daily as needed (pain). 10/18/20   Elodia Florence., MD  folic acid (FOLVITE) Q000111Q MCG tablet Take 800 mcg by mouth daily.    [provider]  gabapentin (NEURONTIN) 100 MG capsule Take 200 mg by mouth 2 (two) times daily. 02/21/21   [provider]  hydrALAZINE (APRESOLINE) 25 MG tablet Take 3 tablets (75 mg total) by mouth 3 (three) times daily. 10/18/20 08/24/23  Elodia Florence., MD  ipratropium-albuterol (DUONEB) 0.5-2.5 (3) MG/3ML SOLN Take 3 mLs by nebulization every 6 (six) hours as needed. 10/27/20   Shelly Coss, MD  levothyroxine (SYNTHROID) 125 MCG tablet Take 125 mcg by mouth daily before breakfast. 08/18/21   [provider]  lisinopril (ZESTRIL) 40 MG tablet Take 40 mg by mouth daily. 09/26/20   [provider]  metoprolol succinate (TOPROL-XL) 50 MG 24 hr tablet TAKE 1 TABLET BY MOUTH ONCE DAILY WITH MEAL 01/25/22   Tolia, Sunit, DO  Multiple Vitamin (MULTIVITAMIN WITH MINERALS) TABS tablet Take 1 tablet by mouth daily.    [provider]  pantoprazole (PROTONIX) 40 MG tablet Take 1 tablet (40 mg total) by mouth at bedtime. 07/06/21 09/01/23  Ladene Artist, MD    Allergies  No Known Allergies  Review of  Systems  ROS  Neurologic Exam  Awake, alert, oriented Memory and concentration grossly intact Speech fluent, appropriate CN grossly intact Motor exam: Upper Extremities Deltoid Bicep Tricep Grip  Right 5/5 5/5 5/5 5/5  Left 5/5 5/5 5/5 5/5   Lower Extremities IP Quad PF DF EHL  Right 5/5 5/5 5/5 5/5 5/5  Left 5/5 5/5 5/5 5/5 5/5   Sensation grossly intact to LT  Impression  - 72 y.o. female 8 months s/p stent-supported coil embolization of basilar apex anerusym  Plan  - Will proceed with f/u diagnostic cerebral angiogram  I have reviewed the indications for the procedure as well as the details of the procedure and the expected postoperative course and recovery at length with  the patient in the office. We have also reviewed in detail the risks, benefits, and alternatives to the procedure. All questions were answered and [name] provided informed consent to proceed.  Consuella Lose, MD [facility]

## 2022-11-22 ENCOUNTER — Emergency Department (HOSPITAL_COMMUNITY): Payer: Medicare HMO

## 2022-11-22 ENCOUNTER — Encounter (HOSPITAL_COMMUNITY): Payer: Self-pay

## 2022-11-22 ENCOUNTER — Inpatient Hospital Stay (HOSPITAL_COMMUNITY)
Admission: EM | Admit: 2022-11-22 | Discharge: 2022-11-27 | DRG: 260 | Disposition: A | Discharge status: Home Health | Payer: Medicare HMO | Attending: Internal Medicine | Admitting: Internal Medicine

## 2022-11-22 ENCOUNTER — Encounter (HOSPITAL_COMMUNITY)
Admission: EM | Disposition: A | Discharge status: Home Health | Payer: Self-pay | Source: Home / Self Care | Attending: Pulmonary Disease

## 2022-11-22 DIAGNOSIS — Z9911 Dependence on respirator [ventilator] status: Secondary | ICD-10-CM | POA: Diagnosis not present

## 2022-11-22 DIAGNOSIS — I480 Paroxysmal atrial fibrillation: Secondary | ICD-10-CM | POA: Diagnosis present

## 2022-11-22 DIAGNOSIS — I129 Hypertensive chronic kidney disease with stage 1 through stage 4 chronic kidney disease, or unspecified chronic kidney disease: Secondary | ICD-10-CM | POA: Diagnosis present

## 2022-11-22 DIAGNOSIS — J449 Chronic obstructive pulmonary disease, unspecified: Secondary | ICD-10-CM | POA: Diagnosis present

## 2022-11-22 DIAGNOSIS — L89151 Pressure ulcer of sacral region, stage 1: Secondary | ICD-10-CM | POA: Diagnosis present

## 2022-11-22 DIAGNOSIS — Z83719 Family history of colon polyps, unspecified: Secondary | ICD-10-CM

## 2022-11-22 DIAGNOSIS — Z8249 Family history of ischemic heart disease and other diseases of the circulatory system: Secondary | ICD-10-CM

## 2022-11-22 DIAGNOSIS — J9601 Acute respiratory failure with hypoxia: Secondary | ICD-10-CM | POA: Diagnosis not present

## 2022-11-22 DIAGNOSIS — E039 Hypothyroidism, unspecified: Secondary | ICD-10-CM | POA: Diagnosis present

## 2022-11-22 DIAGNOSIS — K7030 Alcoholic cirrhosis of liver without ascites: Secondary | ICD-10-CM | POA: Diagnosis present

## 2022-11-22 DIAGNOSIS — R001 Bradycardia, unspecified: Secondary | ICD-10-CM | POA: Diagnosis not present

## 2022-11-22 DIAGNOSIS — Z7902 Long term (current) use of antithrombotics/antiplatelets: Secondary | ICD-10-CM

## 2022-11-22 DIAGNOSIS — G9349 Other encephalopathy: Secondary | ICD-10-CM | POA: Diagnosis not present

## 2022-11-22 DIAGNOSIS — Z923 Personal history of irradiation: Secondary | ICD-10-CM | POA: Diagnosis not present

## 2022-11-22 DIAGNOSIS — I671 Cerebral aneurysm, nonruptured: Secondary | ICD-10-CM | POA: Diagnosis present

## 2022-11-22 DIAGNOSIS — F17210 Nicotine dependence, cigarettes, uncomplicated: Secondary | ICD-10-CM | POA: Diagnosis present

## 2022-11-22 DIAGNOSIS — Z95828 Presence of other vascular implants and grafts: Secondary | ICD-10-CM

## 2022-11-22 DIAGNOSIS — N189 Chronic kidney disease, unspecified: Secondary | ICD-10-CM | POA: Diagnosis present

## 2022-11-22 DIAGNOSIS — K766 Portal hypertension: Secondary | ICD-10-CM | POA: Diagnosis present

## 2022-11-22 DIAGNOSIS — I952 Hypotension due to drugs: Secondary | ICD-10-CM | POA: Diagnosis present

## 2022-11-22 DIAGNOSIS — R578 Other shock: Secondary | ICD-10-CM | POA: Diagnosis present

## 2022-11-22 DIAGNOSIS — R9431 Abnormal electrocardiogram [ECG] [EKG]: Secondary | ICD-10-CM | POA: Diagnosis not present

## 2022-11-22 DIAGNOSIS — I725 Aneurysm of other precerebral arteries: Secondary | ICD-10-CM | POA: Diagnosis present

## 2022-11-22 DIAGNOSIS — N179 Acute kidney failure, unspecified: Secondary | ICD-10-CM | POA: Diagnosis present

## 2022-11-22 DIAGNOSIS — Z79890 Hormone replacement therapy (postmenopausal): Secondary | ICD-10-CM | POA: Diagnosis not present

## 2022-11-22 DIAGNOSIS — T461X5A Adverse effect of calcium-channel blockers, initial encounter: Secondary | ICD-10-CM | POA: Diagnosis present

## 2022-11-22 DIAGNOSIS — R61 Generalized hyperhidrosis: Secondary | ICD-10-CM | POA: Diagnosis present

## 2022-11-22 DIAGNOSIS — I272 Pulmonary hypertension, unspecified: Secondary | ICD-10-CM | POA: Diagnosis present

## 2022-11-22 DIAGNOSIS — E871 Hypo-osmolality and hyponatremia: Secondary | ICD-10-CM | POA: Diagnosis present

## 2022-11-22 DIAGNOSIS — J90 Pleural effusion, not elsewhere classified: Secondary | ICD-10-CM | POA: Diagnosis present

## 2022-11-22 DIAGNOSIS — G9340 Encephalopathy, unspecified: Secondary | ICD-10-CM | POA: Diagnosis present

## 2022-11-22 DIAGNOSIS — I10 Essential (primary) hypertension: Secondary | ICD-10-CM | POA: Diagnosis present

## 2022-11-22 DIAGNOSIS — Z79899 Other long term (current) drug therapy: Secondary | ICD-10-CM

## 2022-11-22 DIAGNOSIS — R34 Anuria and oliguria: Secondary | ICD-10-CM | POA: Diagnosis present

## 2022-11-22 DIAGNOSIS — R4182 Altered mental status, unspecified: Secondary | ICD-10-CM

## 2022-11-22 DIAGNOSIS — Z7982 Long term (current) use of aspirin: Secondary | ICD-10-CM

## 2022-11-22 DIAGNOSIS — I495 Sick sinus syndrome: Principal | ICD-10-CM | POA: Diagnosis present

## 2022-11-22 DIAGNOSIS — Z801 Family history of malignant neoplasm of trachea, bronchus and lung: Secondary | ICD-10-CM

## 2022-11-22 DIAGNOSIS — Z8711 Personal history of peptic ulcer disease: Secondary | ICD-10-CM

## 2022-11-22 HISTORY — PX: TEMPORARY PACEMAKER: CATH118268

## 2022-11-22 LAB — CBC WITH DIFFERENTIAL/PLATELET
Abs Immature Granulocytes: 0.05 10*3/uL (ref 0.00–0.07)
Basophils Absolute: 0 10*3/uL (ref 0.0–0.1)
Basophils Relative: 0 %
Eosinophils Absolute: 0 10*3/uL (ref 0.0–0.5)
Eosinophils Relative: 0 %
HCT: 33.2 % — ABNORMAL LOW (ref 36.0–46.0)
Hemoglobin: 11.8 g/dL — ABNORMAL LOW (ref 12.0–15.0)
Immature Granulocytes: 1 %
Lymphocytes Relative: 10 %
Lymphs Abs: 1 10*3/uL (ref 0.7–4.0)
MCH: 32.2 pg (ref 26.0–34.0)
MCHC: 35.5 g/dL (ref 30.0–36.0)
MCV: 90.7 fL (ref 80.0–100.0)
Monocytes Absolute: 0.5 10*3/uL (ref 0.1–1.0)
Monocytes Relative: 5 %
Neutro Abs: 8.3 10*3/uL — ABNORMAL HIGH (ref 1.7–7.7)
Neutrophils Relative %: 84 %
Platelets: 198 10*3/uL (ref 150–400)
RBC: 3.66 MIL/uL — ABNORMAL LOW (ref 3.87–5.11)
RDW: 12.5 % (ref 11.5–15.5)
WBC: 9.8 10*3/uL (ref 4.0–10.5)
nRBC: 0 % (ref 0.0–0.2)

## 2022-11-22 LAB — I-STAT VENOUS BLOOD GAS, ED
Acid-base deficit: 8 mmol/L — ABNORMAL HIGH (ref 0.0–2.0)
Bicarbonate: 18.6 mmol/L — ABNORMAL LOW (ref 20.0–28.0)
Calcium, Ion: 0.92 mmol/L — ABNORMAL LOW (ref 1.15–1.40)
HCT: 36 % (ref 36.0–46.0)
Hemoglobin: 12.2 g/dL (ref 12.0–15.0)
O2 Saturation: 49 %
Potassium: 5.1 mmol/L (ref 3.5–5.1)
Sodium: 125 mmol/L — ABNORMAL LOW (ref 135–145)
TCO2: 20 mmol/L — ABNORMAL LOW (ref 22–32)
pCO2, Ven: 40.8 mmHg — ABNORMAL LOW (ref 44–60)
pH, Ven: 7.266 (ref 7.25–7.43)
pO2, Ven: 30 mmHg — CL (ref 32–45)

## 2022-11-22 LAB — URINALYSIS, ROUTINE W REFLEX MICROSCOPIC
Bilirubin Urine: NEGATIVE
Glucose, UA: NEGATIVE mg/dL
Ketones, ur: NEGATIVE mg/dL
Nitrite: NEGATIVE
Protein, ur: 100 mg/dL — AB
Specific Gravity, Urine: 1.016 (ref 1.005–1.030)
WBC, UA: >50 WBC/hpf (ref 0–5)
pH: 5 (ref 5.0–8.0)

## 2022-11-22 LAB — RAPID URINE DRUG SCREEN, HOSP PERFORMED
Amphetamines: NOT DETECTED
Barbiturates: NOT DETECTED
Benzodiazepines: NOT DETECTED
Cocaine: NOT DETECTED
Opiates: POSITIVE — AB
Tetrahydrocannabinol: NOT DETECTED

## 2022-11-22 LAB — T4, FREE: Free T4: 1.51 ng/dL — ABNORMAL HIGH (ref 0.61–1.12)

## 2022-11-22 LAB — HEPATIC FUNCTION PANEL
ALT: 57 U/L — ABNORMAL HIGH (ref 0–44)
AST: 114 U/L — ABNORMAL HIGH (ref 15–41)
Albumin: 3.3 g/dL — ABNORMAL LOW (ref 3.5–5.0)
Alkaline Phosphatase: 66 U/L (ref 38–126)
Bilirubin, Direct: 0.2 mg/dL (ref 0.0–0.2)
Indirect Bilirubin: 0.4 mg/dL (ref 0.3–0.9)
Total Bilirubin: 0.6 mg/dL (ref 0.3–1.2)
Total Protein: 5.7 g/dL — ABNORMAL LOW (ref 6.5–8.1)

## 2022-11-22 LAB — BASIC METABOLIC PANEL
Anion gap: 16 — ABNORMAL HIGH (ref 5–15)
BUN: 39 mg/dL — ABNORMAL HIGH (ref 8–23)
CO2: 21 mmol/L — ABNORMAL LOW (ref 22–32)
Calcium: 8.4 mg/dL — ABNORMAL LOW (ref 8.9–10.3)
Chloride: 89 mmol/L — ABNORMAL LOW (ref 98–111)
Creatinine, Ser: 2.32 mg/dL — ABNORMAL HIGH (ref 0.44–1.00)
GFR, Estimated: 22 mL/min — ABNORMAL LOW (ref >60)
Glucose, Bld: 90 mg/dL (ref 70–99)
Potassium: 5 mmol/L (ref 3.5–5.1)
Sodium: 126 mmol/L — ABNORMAL LOW (ref 135–145)

## 2022-11-22 LAB — I-STAT ARTERIAL BLOOD GAS, ED
Acid-base deficit: 8 mmol/L — ABNORMAL HIGH (ref 0.0–2.0)
Bicarbonate: 18.1 mmol/L — ABNORMAL LOW (ref 20.0–28.0)
Calcium, Ion: 1.08 mmol/L — ABNORMAL LOW (ref 1.15–1.40)
HCT: 34 % — ABNORMAL LOW (ref 36.0–46.0)
Hemoglobin: 11.6 g/dL — ABNORMAL LOW (ref 12.0–15.0)
O2 Saturation: 100 %
Patient temperature: 97.1
Potassium: 5 mmol/L (ref 3.5–5.1)
Sodium: 126 mmol/L — ABNORMAL LOW (ref 135–145)
TCO2: 19 mmol/L — ABNORMAL LOW (ref 22–32)
pCO2 arterial: 39.1 mmHg (ref 32–48)
pH, Arterial: 7.27 — ABNORMAL LOW (ref 7.35–7.45)
pO2, Arterial: 193 mmHg — ABNORMAL HIGH (ref 83–108)

## 2022-11-22 LAB — TROPONIN I (HIGH SENSITIVITY)
Troponin I (High Sensitivity): 19 ng/L — ABNORMAL HIGH (ref <18)
Troponin I (High Sensitivity): 20 ng/L — ABNORMAL HIGH (ref <18)

## 2022-11-22 LAB — AMMONIA: Ammonia: 35 umol/L (ref 9–35)

## 2022-11-22 LAB — TSH: TSH: 0.955 u[IU]/mL (ref 0.350–4.500)

## 2022-11-22 LAB — MAGNESIUM: Magnesium: 1.9 mg/dL (ref 1.7–2.4)

## 2022-11-22 SURGERY — TEMPORARY PACEMAKER
Anesthesia: LOCAL

## 2022-11-22 MED ORDER — POLYETHYLENE GLYCOL 3350 17 G PO PACK: 17.0000 g | PACK | Freq: Every day | ORAL | Status: DC | PRN

## 2022-11-22 MED ORDER — LORAZEPAM 2 MG/ML IJ SOLN
0.5000 mg | Freq: Once | INTRAMUSCULAR | Status: AC
  Administered 2022-11-22: 0.5 mg via INTRAVENOUS
  Filled 2022-11-22: qty 1

## 2022-11-22 MED ORDER — CALCIUM GLUCONATE-NACL 1-0.675 GM/50ML-% IV SOLN
1.0000 g | Freq: Once | INTRAVENOUS | Status: AC
  Administered 2022-11-23: 1000 mg via INTRAVENOUS
  Filled 2022-11-22: qty 50

## 2022-11-22 MED ORDER — INSULIN ASPART 100 UNIT/ML IJ SOLN: 0.0000 [IU] | INTRAMUSCULAR | Status: DC

## 2022-11-22 MED ORDER — ROCURONIUM BROMIDE 50 MG/5ML IV SOLN
55.0000 mg | Freq: Once | INTRAVENOUS | Status: AC
  Administered 2022-11-22: 55 mg via INTRAVENOUS
  Filled 2022-11-22: qty 5.5

## 2022-11-22 MED ORDER — SODIUM CHLORIDE 0.9 % IV SOLN: 1.0000 g | Freq: Once | INTRAVENOUS | Status: DC

## 2022-11-22 MED ORDER — ATROPINE SULFATE 1 MG/10ML IJ SOSY: 1.0000 mg | PREFILLED_SYRINGE | Freq: Once | INTRAMUSCULAR | Status: AC

## 2022-11-22 MED ORDER — MAGNESIUM SULFATE 2 GM/50ML IV SOLN
2.0000 g | Freq: Once | INTRAVENOUS | Status: AC
  Administered 2022-11-23: 2 g via INTRAVENOUS
  Filled 2022-11-22: qty 50

## 2022-11-22 MED ORDER — SODIUM CHLORIDE 0.9 % IV SOLN: INTRAVENOUS | Status: DC

## 2022-11-22 MED ORDER — ETOMIDATE 2 MG/ML IV SOLN
16.0000 mg | Freq: Once | INTRAVENOUS | Status: AC
  Administered 2022-11-22: 16 mg via INTRAVENOUS

## 2022-11-22 MED ORDER — DOPAMINE-DEXTROSE 3.2-5 MG/ML-% IV SOLN: 0.0000 ug/kg/min | INTRAVENOUS | Status: DC

## 2022-11-22 MED ORDER — VANCOMYCIN HCL IN DEXTROSE 1-5 GM/200ML-% IV SOLN
1000.0000 mg | Freq: Once | INTRAVENOUS | Status: AC
  Administered 2022-11-23: 1000 mg via INTRAVENOUS
  Filled 2022-11-22: qty 200

## 2022-11-22 MED ORDER — SODIUM CHLORIDE 0.9 % IV BOLUS
1000.0000 mL | Freq: Once | INTRAVENOUS | Status: AC
  Administered 2022-11-22: 1000 mL via INTRAVENOUS

## 2022-11-22 MED ORDER — FENTANYL CITRATE PF 50 MCG/ML IJ SOSY: 50.0000 ug | PREFILLED_SYRINGE | INTRAMUSCULAR | Status: DC | PRN

## 2022-11-22 MED ORDER — FENTANYL 2500MCG IN NS 250ML (10MCG/ML) PREMIX INFUSION
INTRAVENOUS | Status: AC
  Filled 2022-11-22: qty 250

## 2022-11-22 MED ORDER — PIPERACILLIN-TAZOBACTAM IN DEX 2-0.25 GM/50ML IV SOLN
2.2500 g | Freq: Three times a day (TID) | INTRAVENOUS | Status: DC
  Administered 2022-11-23 – 2022-11-24 (×4): 2.25 g via INTRAVENOUS
  Filled 2022-11-22 (×6): qty 50

## 2022-11-22 MED ORDER — NOREPINEPHRINE 4 MG/250ML-% IV SOLN
2.0000 ug/min | INTRAVENOUS | Status: DC
  Administered 2022-11-22: 3 ug/min via INTRAVENOUS
  Filled 2022-11-22 (×2): qty 250

## 2022-11-22 MED ORDER — PIPERACILLIN-TAZOBACTAM 3.375 G IVPB 30 MIN
3.3750 g | Freq: Once | INTRAVENOUS | Status: DC
  Filled 2022-11-22: qty 50

## 2022-11-22 MED ORDER — HEPARIN SODIUM (PORCINE) 5000 UNIT/ML IJ SOLN
5000.0000 [IU] | Freq: Three times a day (TID) | INTRAMUSCULAR | Status: DC
  Administered 2022-11-23 – 2022-11-26 (×10): 5000 [IU] via SUBCUTANEOUS
  Filled 2022-11-22 (×11): qty 1

## 2022-11-22 MED ORDER — POLYETHYLENE GLYCOL 3350 17 G PO PACK
17.0000 g | PACK | Freq: Every day | ORAL | Status: DC
  Administered 2022-11-23: 17 g
  Filled 2022-11-22: qty 1

## 2022-11-22 MED ORDER — DOCUSATE SODIUM 100 MG PO CAPS: 100.0000 mg | ORAL_CAPSULE | Freq: Two times a day (BID) | ORAL | Status: DC | PRN

## 2022-11-22 MED ORDER — FAMOTIDINE 20 MG PO TABS
20.0000 mg | ORAL_TABLET | Freq: Two times a day (BID) | ORAL | Status: DC
  Administered 2022-11-23 (×2): 20 mg
  Filled 2022-11-22 (×2): qty 1

## 2022-11-22 MED ORDER — MIDAZOLAM HCL 2 MG/2ML IJ SOLN
1.0000 mg | INTRAMUSCULAR | Status: DC | PRN
  Filled 2022-11-22: qty 2

## 2022-11-22 MED ORDER — LIDOCAINE HCL (PF) 1 % IJ SOLN
INTRAMUSCULAR | Status: AC
  Filled 2022-11-22: qty 30

## 2022-11-22 MED ORDER — SODIUM CHLORIDE 0.9 % IV SOLN
250.0000 mL | INTRAVENOUS | Status: DC
  Administered 2022-11-23: 250 mL via INTRAVENOUS

## 2022-11-22 MED ORDER — DOCUSATE SODIUM 50 MG/5ML PO LIQD: 100.0000 mg | Freq: Two times a day (BID) | ORAL | Status: DC

## 2022-11-22 MED ORDER — HEPARIN (PORCINE) IN NACL 1000-0.9 UT/500ML-% IV SOLN
INTRAVENOUS | Status: DC | PRN
  Administered 2022-11-22 (×2): 500 mL

## 2022-11-22 MED ORDER — VANCOMYCIN VARIABLE DOSE PER UNSTABLE RENAL FUNCTION (PHARMACIST DOSING): Status: DC

## 2022-11-22 MED ORDER — ATROPINE SULFATE 1 MG/10ML IJ SOSY
PREFILLED_SYRINGE | INTRAMUSCULAR | Status: AC
  Administered 2022-11-22: 1 mg via INTRAVENOUS
  Filled 2022-11-22: qty 10

## 2022-11-22 MED ORDER — SENNOSIDES 8.8 MG/5ML PO SYRP
5.0000 mL | ORAL_SOLUTION | Freq: Two times a day (BID) | ORAL | Status: DC
  Administered 2022-11-23 (×3): 5 mL
  Filled 2022-11-22 (×4): qty 5

## 2022-11-22 MED ORDER — LEVOTHYROXINE SODIUM 112 MCG PO TABS
112.0000 ug | ORAL_TABLET | Freq: Every day | ORAL | Status: DC
  Administered 2022-11-23 – 2022-11-24 (×2): 112 ug
  Filled 2022-11-22 (×2): qty 1

## 2022-11-22 MED ORDER — FENTANYL BOLUS VIA INFUSION: 50.0000 ug | INTRAVENOUS | Status: DC | PRN

## 2022-11-22 MED ORDER — LIDOCAINE HCL (PF) 1 % IJ SOLN
INTRAMUSCULAR | Status: DC | PRN
  Administered 2022-11-22: 12 mL

## 2022-11-22 MED ORDER — FENTANYL 2500MCG IN NS 250ML (10MCG/ML) PREMIX INFUSION
0.0000 ug/h | INTRAVENOUS | Status: DC
  Administered 2022-11-22: 50 ug/h via INTRAVENOUS

## 2022-11-22 MED ORDER — SODIUM CHLORIDE 0.9 % IV SOLN
INTRAVENOUS | Status: AC | PRN
  Administered 2022-11-22: 100 mL/h via INTRAVENOUS
  Administered 2022-11-22: 10 mL/h via INTRAVENOUS

## 2022-11-22 SURGICAL SUPPLY — 6 items
CABLE ADAPT PACING TEMP 12FT (ADAPTER) IMPLANT
KIT MICROPUNCTURE NIT STIFF (SHEATH) IMPLANT
PACK CARDIAC CATHETERIZATION (CUSTOM PROCEDURE TRAY) ×1 IMPLANT
SHEATH PINNACLE 6F 10CM (SHEATH) IMPLANT
SHEATH PROBE COVER 6X72 (BAG) IMPLANT
WIRE PACING TEMP ST TIP 5 (CATHETERS) IMPLANT

## 2022-11-22 NOTE — ED Notes (Signed)
Patient pacing @66ppm .

## 2022-11-22 NOTE — ED Notes (Signed)
Family at bedside. 

## 2022-11-22 NOTE — H&P (Signed)
NAME:  AVALEI BELD, MRN:  [id number], DOB:  07-26-1950, LOS: 0 ADMISSION DATE:  11/22/2022, CONSULTATION DATE:  11/22/22 REFERRING MD:  EDP, CHIEF COMPLAINT:  acute hypoxic resp failure, symptomatic bradycardia   History of Present Illness:  72 yo female with pmh htn, hypothyroidism, cirrhosis, portal hypertension, paf not on chronic a/c, basilar artery aneurysm s/p coil presented today with encephalopathy, hypotension with bradycardia req cutaneous pacing to maintain BP. Pt was reportedly in her normal state of health until this afternoon when her daughter found her disoriented and diaphoretic. EMS was called and she was noted to be bradycardic and hypotensive. She was started on transcutaneous pacing.   Upon arrival to ed she was reportedly relatively stable while pacing but would have periods of loc, she underwent cth without acute abnormality found, labs revealed hyponatremia and aki with Cr (baseline 0.8) up to 2.32, hypocalcemia and elevated lft's. No clear indication of infection, no leukocytosis, afebrile, cxr clear.   Per report from EDP, cardiology was consulted and are seeing for TVP placement when pt became suddenly unresponsive req emergency ett, as such ccm has been asked to admit pt as cardiology does not feel this is cardiac in etiology.   Pt is currently intubated and sedation on vent and heading to cath lab for TVP placement.   Pertinent  Medical History  As above  Significant Hospital Events: Including procedures, antibiotic start and stop dates in addition to other pertinent events   Admitted to [facility] 11/22/22 Intubated, tvp placed 11/22/22  Interim History / Subjective:    Objective   Blood pressure 107/61, pulse (!) 38, temperature (!) 97.2 F (36.2 C), resp. rate 20, height 5' (1.524 m), weight 50.3 kg, SpO2 100 %.    Vent Mode: PRVC FiO2 (%):  [60 %] 60 % Set Rate:  [20 bmp] 20 bmp Vt Set:  [380 mL] 380 mL PEEP:  [5 cmH20] 5 cmH20 Plateau Pressure:  [16  cmH20] 16 cmH20  No intake or output data in the 24 hours ending 11/22/22 2244 Filed Weights   11/22/22 2100  Weight: 50.3 kg    Examination: General: sedated intubated on vent HENT: ncat, perrla, mmmp Lungs: ctab Cardiovascular: rrr, tvp in place set at 70 Abdomen: soft nt,nd bs+ Extremities: no c/c/e Neuro: sedated unresponsive  GU: deferred  Resolved Hospital Problem list     Assessment & Plan:  Symptomatic bradycardia:  Paf at baseline (not on chronic a/c) H/o htn -tvp being placed -levo started, will wean as able map >65 -correct electrolyte abnormalities, calcium, mag, hyponatremia -echo ordered -bnp ordered, thyroid studies wnl -on home bb and other anti htn agents but will hold this for now -uds pending -rest per cards  Acute encephalopathY:  -cth negative -ammonia 35 -thyroid studies stable -consider MRI if no improvement +/- eeg -will pan cx as well and cont abx for now but not overtly infectious appearing  Aki Transaminitis:  -gently hydration for now -check lactate -check coags for synthetic function -known cirrhosis at baseline -renal u/s pending as well  Hypothyroidism:  -cont levothyroxine   Best Practice (right click and "Reselect all SmartList Selections" daily)   Diet/type: NPO DVT prophylaxis: prophylactic heparin  GI prophylaxis: H2B Lines: Central line Foley:  Yes, and it is still needed Code Status:  full code Last date of multidisciplinary goals of care discussion [will need to address with family as pt has previously been full code and also DNR at different intervals. ]  Labs   CBC:  Recent Labs  Lab 11/22/22 1839 11/22/22 2125 11/22/22 2229  WBC 9.8  --   --   NEUTROABS 8.3*  --   --   HGB 11.8* 12.2 11.6*  HCT 33.2* 36.0 34.0*  MCV 90.7  --   --   PLT 198  --   --     Basic Metabolic Panel: Recent Labs  Lab 11/22/22 1839 11/22/22 2125 11/22/22 2229  NA 126* 125* 126*  K 5.0 5.1 5.0  CL 89*  --   --   CO2  21*  --   --   GLUCOSE 90  --   --   BUN 39*  --   --   CREATININE 2.32*  --   --   CALCIUM 8.4*  --   --   MG 1.9  --   --    GFR: Estimated Creatinine Clearance: 15.5 mL/min (A) (by C-G formula based on SCr of 2.32 mg/dL (H)). Recent Labs  Lab 11/22/22 1839  WBC 9.8    Liver Function Tests: Recent Labs  Lab 11/22/22 1839  AST 114*  ALT 57*  ALKPHOS 66  BILITOT 0.6  PROT 5.7*  ALBUMIN 3.3*   No results for input(s): "LIPASE", "AMYLASE" in the last 168 hours. Recent Labs  Lab 11/22/22 1839  AMMONIA 35    ABG    Component Value Date/Time   PHART 7.270 (L) 11/22/2022 2229   PCO2ART 39.1 11/22/2022 2229   PO2ART 193 (H) 11/22/2022 2229   HCO3 18.1 (L) 11/22/2022 2229   TCO2 19 (L) 11/22/2022 2229   ACIDBASEDEF 8.0 (H) 11/22/2022 2229   O2SAT 100 11/22/2022 2229     Coagulation Profile: No results for input(s): "INR", "PROTIME" in the last 168 hours.  Cardiac Enzymes: No results for input(s): "CKTOTAL", "CKMB", "CKMBINDEX", "TROPONINI" in the last 168 hours.  HbA1C: Hgb A1c MFr Bld  Date/Time Value Ref Range Status  10/12/2020 03:01 PM 4.7 (L) 4.8 - 5.6 % Final    Comment:    REPEATED TO VERIFY (NOTE) Pre diabetes:          5.7%-6.4%  Diabetes:              >6.4%  Glycemic control for   <7.0% adults with diabetes     CBG: No results for input(s): "GLUCAP" in the last 168 hours.  Review of Systems:   Unobtainable 2/2 pt intubated sedated status  Past Medical History:  She,  has a past medical history of [place] (03/16/2010), Arthritis, Atrial fibrillation (HCC), Chronic kidney disease, COPD (chronic obstructive pulmonary disease) (HCC), GI bleeding (06/2018), Heart murmur, Hypertension, Hypothyroidism, Liver cirrhosis (HCC), Multiple duodenal ulcers (07/16/2018), and Thyroid disease.   Surgical History:   Past Surgical History:  Procedure Laterality Date   BIOPSY  07/16/2018   Procedure: BIOPSY;  Surgeon: Napoleon Form, MD;   Location: [facility];  Service: Endoscopy;;   BREAST EXCISIONAL BIOPSY Left    benign   CHOLECYSTECTOMY N/A 12/07/2015   Procedure: LAPAROSCOPIC CHOLECYSTECTOMY WITH INTRAOPERATIVE CHOLANGIOGRAM;  Surgeon: Ovidio Kin, MD;  Location: [facility] ORS;  Service: General;  Laterality: N/A;   COLONOSCOPY     ERCP N/A 12/06/2015   Procedure: ENDOSCOPIC RETROGRADE CHOLANGIOPANCREATOGRAPHY (ERCP);  Surgeon: Rachael Fee, MD;  Location: [facility];  Service: Endoscopy;  Laterality: N/A;   ESOPHAGOGASTRODUODENOSCOPY (EGD) WITH PROPOFOL N/A 07/16/2018   Procedure: ESOPHAGOGASTRODUODENOSCOPY (EGD) WITH PROPOFOL;  Surgeon: Napoleon Form, MD;  Location: [facility];  Service: Endoscopy;  Laterality: N/A;  IM NAILING TIBIA Right 07/19/2012   DR Despina Hick   IR ANGIO INTRA EXTRACRAN SEL COM CAROTID INNOMINATE BILAT MOD SED  12/12/2021   IR ANGIO VERTEBRAL SEL VERTEBRAL UNI L MOD SED  12/12/2021   IR ANGIO VERTEBRAL SEL VERTEBRAL UNI L MOD SED  01/09/2022   IR ANGIO VERTEBRAL SEL VERTEBRAL UNI R MOD SED  09/14/2022   IR ANGIOGRAM FOLLOW UP STUDY  01/09/2022   IR ANGIOGRAM FOLLOW UP STUDY  01/09/2022   IR ANGIOGRAM FOLLOW UP STUDY  01/09/2022   IR ANGIOGRAM FOLLOW UP STUDY  01/09/2022   IR ANGIOGRAM FOLLOW UP STUDY  01/09/2022   IR ANGIOGRAM FOLLOW UP STUDY  01/09/2022   IR ANGIOGRAM FOLLOW UP STUDY  01/09/2022   IR ANGIOGRAM FOLLOW UP STUDY  01/09/2022   IR ANGIOGRAM FOLLOW UP STUDY  01/09/2022   IR NEURO EACH ADD'L AFTER BASIC UNI LEFT (MS)  01/09/2022   IR TRANSCATH/EMBOLIZ  01/09/2022   IR US GUIDE VASC ACCESS RIGHT  09/14/2022   IR VERTEBROPLASTY CERV/THOR BX INC UNI/BIL INC/INJECT/IMAGING  10/17/2020   IR VERTEBROPLASTY EA ADDL (T&LS) BX INC UNI/BIL INC INJECT/IMAGING  10/17/2020   RADIOLOGY WITH ANESTHESIA N/A 01/09/2022   Procedure: Aneurysm embolization;  Surgeon: Lisbeth Renshaw, MD;  Location: [facility] OR;  Service: Radiology;  Laterality: N/A;   SHOULDER HEMI-ARTHROPLASTY Right 07/22/2013   Procedure:  RIGHT SHOULDER HEMI-ARTHROPLASTY;  Surgeon: Verlee Rossetti, MD;  Location: [facility] OR;  Service: Orthopedics;  Laterality: Right;   TIBIA IM NAIL INSERTION Right 07/19/2013   Procedure: INTRAMEDULLARY (IM) NAIL TIBIAL;  Surgeon: Loanne Drilling, MD;  Location: [facility] OR;  Service: Orthopedics;  Laterality: Right;   TUBAL LIGATION     UPPER GASTROINTESTINAL ENDOSCOPY       Social History:   reports that she has been smoking cigarettes. She has a 40.00 pack-year smoking history. She has never used smokeless tobacco. She reports that she does not currently use alcohol. She reports that she does not use drugs.   Family History:  Her family history includes Colon polyps in her brother; Healthy in her daughter and daughter; Heart disease in her brother, brother, and mother; Lung cancer in her father; Psoriasis in her brother. There is no history of Colon cancer, Esophageal cancer, Rectal cancer, or Stomach cancer.   Allergies No Known Allergies   Home Medications  Prior to Admission medications   Medication Sig Start Date End Date Taking? Authorizing Provider  albuterol (VENTOLIN HFA) 108 (90 Base) MCG/ACT inhaler Inhale 2 puffs into the lungs every 4 (four) hours as needed for wheezing or shortness of breath. 06/15/21   [provider]  aspirin EC 81 MG tablet Take 81 mg by mouth daily. Swallow whole.    [provider]  B Complex Vitamins (B COMPLEX 1 PO) Take 1 tablet by mouth daily.    [provider]  BRILINTA 90 MG TABS tablet Take 90 mg by mouth 2 (two) times daily. 12/25/21   [provider]  calcium carbonate (OSCAL) 1500 (600 Ca) MG TABS tablet Take 600 mg of elemental calcium by mouth daily with breakfast.    [provider]  citalopram (CELEXA) 20 MG tablet TAKE 1 TABLET BY MOUTH EVERY DAY. Patient taking differently: Take 20 mg by mouth daily. 03/17/14   Tyrone Nine, MD  diclofenac Sodium (VOLTAREN) 1 % GEL Apply 2 g topically 4 (four) times daily  as needed (pain). 10/18/20   Zigmund Daniel., MD  folic acid (  FOLVITE) 800 MCG tablet Take 800 mcg by mouth daily.    [provider]  gabapentin (NEURONTIN) 100 MG capsule Take 200 mg by mouth 2 (two) times daily. 02/21/21   [provider]  hydrALAZINE (APRESOLINE) 25 MG tablet Take 3 tablets (75 mg total) by mouth 3 (three) times daily. 10/18/20 08/24/23  Zigmund Daniel., MD  ipratropium-albuterol (DUONEB) 0.5-2.5 (3) MG/3ML SOLN Take 3 mLs by nebulization every 6 (six) hours as needed. 10/27/20   Burnadette Pop, MD  levothyroxine (SYNTHROID) 125 MCG tablet Take 125 mcg by mouth daily before breakfast. 08/18/21   [provider]  lisinopril (ZESTRIL) 40 MG tablet Take 40 mg by mouth daily. 09/26/20   [provider]  metoprolol succinate (TOPROL-XL) 50 MG 24 hr tablet TAKE 1 TABLET BY MOUTH ONCE DAILY WITH MEAL 01/25/22   Tolia, Sunit, DO  Multiple Vitamin (MULTIVITAMIN WITH MINERALS) TABS tablet Take 1 tablet by mouth daily.    [provider]  pantoprazole (PROTONIX) 40 MG tablet Take 1 tablet (40 mg total) by mouth at bedtime. 07/06/21 09/01/23  Meryl Dare, MD     Critical care time: 

## 2022-11-22 NOTE — Progress Notes (Signed)
Paged to support daughter of patient, but she was not present at the time of my visit. Patient was not available. Nurse secretary said she would page chaplain when family returned.  Chaplain [place], [place]   11/22/22 2135  Spiritual Encounters  Type of Visit Attempt (pt unavailable)  Care provided to: Pt not available  Conversation partners present during encounter Nurse  Referral source Code page  Reason for visit Code  OnCall Visit Yes

## 2022-11-22 NOTE — Progress Notes (Signed)
Per unit RN pt has 3 PIV in place and is now being taken to the cath lab.

## 2022-11-22 NOTE — ED Triage Notes (Signed)
Pt bib ems from home; family reports around 10pm last night noticed increased lethargy and slurred speech; slurred speech intermittent; a and o x 4 at present; no other neuro deficits; pt pale, diaphoretic, HR 32 on ems arrival; 12 lead showed junctional rhythm; 20 ga lac; 1 mg atropine given pta; hr up 52 initially, now 38; 96% RA, pt placed on 4L 02 [place]; BP 94/54, cbg 122, RR 18; pt denies sob, no CP; endorses some nausea

## 2022-11-22 NOTE — Progress Notes (Signed)
Cardiology called regarding [facility] patient known to [place]. Patient is in sinus bradycardia with rates ~35 bpm. MAP has been holding around 60-65. No high degree heart block or syncope noted.  Would recommend starting dopamine gtt given bradycardia and hypotension. If IJ central line is placed, we can certainly swap out for TVP va that route. Patient is only on ASA and has been off Eliquis. Given lack of high degree hea rt block and no syncope, would recommend trying dopamine gtt for now. I did discuss with on call pharmacist who is going to try levo for now and switch to dopamine if not hemodynamically stable. ICU is requesting emergent TVP. I will call my interventional back up Dr Rosemary Holms to get this procedure going. Please call with any changes in clinical status.   Taylor Dieter, DO [facility]  11/22/2022 21:09

## 2022-11-22 NOTE — ED Notes (Addendum)
[name] and [name] updated by MD

## 2022-11-22 NOTE — ED Notes (Signed)
Rectal Temp ; 97.6

## 2022-11-22 NOTE — ED Notes (Signed)
Defibrillator placed on patient

## 2022-11-22 NOTE — Progress Notes (Signed)
Chaplain responded to page to support patient's daughter, Higinio Plan when she returned to ED. I provided reflective listening, prayer and a compassionate presence. Patient has prayer support from church and friends. Additional support is available from chaplains as needed.  Chaplain [place], [place]   11/22/22 2215  Spiritual Encounters  Type of Visit Follow up  Care provided to: Pt and family  Referral source Code page  Reason for visit Code  OnCall Visit Yes  Spiritual Framework  Presenting Themes Impactful experiences and emotions;Community and relationships  Community/Connection Family;Faith community  Patient Stress Factors Health changes  Interventions  Spiritual Care Interventions Made Established relationship of care and support;Compassionate presence;Reflective listening;Prayer  Intervention Outcomes  Outcomes Awareness of support;Reduced anxiety;Connection to spiritual care  Spiritual Care Plan  Spiritual Care Issues Still Outstanding No further spiritual care needs at this time (see row info)

## 2022-11-22 NOTE — ED Notes (Signed)
Patient HR continues in 30's, Patient continues to converse with staff and family. C/O pain due to pacing/ mild anxiety noted. MD made aware

## 2022-11-22 NOTE — Progress Notes (Signed)
Pharmacy Antibiotic Note  [name] is a 72 y.o. female admitted on 11/22/2022 with sepsis.  Pharmacy has been consulted for vancomycin and Zosyn dosing.  Patient presented with bradycardia and hypotension requiring transvenous pacing. Despite HR >70, she experienced worsening dyspnea, hypotension, and AMS requiring intubation and norepinephrine. WBC 9.8 and temp 97.1F. Concern for sepsis in addition to bradycardia. CrCl currently 15.5 ml/min with severe AKI likely secondary to hypotension/hypoperfusion. Patient to receive transvenous pacing wire tonight.  Plan: Give vancomycin 1g load. Further doses per levels given severe AKI Give Zosyn 3.375g once, followed by 2.25g every 8 hours extended interval infusion afterwards until creatinine reaches plateau Monitor clinical signs, symptoms, and cultures  Height: 5' (152.4 cm) Weight: 50.3 kg (111 lb) IBW/kg (Calculated) : 45.5  Temp (24hrs), Avg:97.4 F (36.3 C), Min:97.4 F (36.3 C), Max:97.4 F (36.3 C)  Recent Labs  Lab 11/22/22 1839  WBC 9.8  CREATININE 2.32*    Estimated Creatinine Clearance: 15.5 mL/min (A) (by C-G formula based on SCr of 2.32 mg/dL (H)).    No Known Allergies  Antimicrobials this admission: Vancomycin 5/30 >>   Zosyn 5/30 >>    Microbiology results: 5/30 BCx: IP 5/30 MRSA PCR: IP  Thank you for involving pharmacy in this patient's care.  Enos Fling, PharmD PGY2 Pharmacy Resident 11/22/2022 10:07 PM

## 2022-11-22 NOTE — ED Notes (Signed)
Patient decrease in alertness, drowsy, and unable to follow verbal commands. MD made aware

## 2022-11-22 NOTE — Consult Note (Addendum)
CARDIOLOGY CONSULT NOTE  Patient ID: [name] MRN: [id number] DOB/AGE: 12-14-1950 72 y.o.  Admit date: 11/22/2022 Referring Physician: [facility] Reason for Consultation:  Symptomatic bradycardia  HPI:   72 y.o. 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  Patient is currently intubated and sedated. History obtained form daughter [name] over the phone and ER physician at bedside. It appears that patient was in her usual state of health until this afternoon when she was found confused, disoriented, sweating. There was no known h/o fever, chills etc. EMS were called, she was found to be bradycardiac and hypotensive. She was started on transcutaneous pacing. Cr elevated at 2.32, baseline <1.0. While in ER, her mental status further deteriorated in spite of HR in 70s with external pacing, thus requiring emergency ET tube placement.   At baseline, she is on metoprolol succinate 100 mg daily. She is not on anticoagulation due to her portal hypertension, but was being considered for LAA closure.   Past Medical History:  Diagnosis Date   ACROCHORDON 03/16/2010   Arthritis    Atrial fibrillation (HCC)    Chronic kidney disease    III   COPD (chronic obstructive pulmonary disease) (HCC)    GI bleeding 06/2018   Heart murmur    pt had echo 10/10/20   Hypertension    Hypothyroidism    Liver cirrhosis (HCC)    Multiple duodenal ulcers 07/16/2018   associated with melena, anemia, gastritis. due to NSAID.  hx gastric ulcers and bleeding 2004, 2007   Thyroid disease    S/p Radiation     Past Surgical History:  Procedure Laterality Date   BIOPSY  07/16/2018   Procedure: BIOPSY;  Surgeon: Napoleon Form, MD;  Location: [facility];  Service: Endoscopy;;   BREAST  EXCISIONAL BIOPSY Left    benign   CHOLECYSTECTOMY N/A 12/07/2015   Procedure: LAPAROSCOPIC CHOLECYSTECTOMY WITH INTRAOPERATIVE CHOLANGIOGRAM;  Surgeon: Ovidio Kin, MD;  Location: [facility];  Service: General;  Laterality: N/A;   COLONOSCOPY     ERCP N/A 12/06/2015   Procedure: ENDOSCOPIC RETROGRADE CHOLANGIOPANCREATOGRAPHY (ERCP);  Surgeon: Rachael Fee, MD;  Location: [facility];  Service: Endoscopy;  Laterality: N/A;   ESOPHAGOGASTRODUODENOSCOPY (EGD) WITH PROPOFOL N/A 07/16/2018   Procedure: ESOPHAGOGASTRODUODENOSCOPY (EGD) WITH PROPOFOL;  Surgeon: Napoleon Form, MD;  Location: [facility];  Service: Endoscopy;  Laterality: N/A;   IM NAILING TIBIA Right 07/19/2012   DR Despina Hick   IR ANGIO INTRA EXTRACRAN SEL COM CAROTID INNOMINATE BILAT MOD SED  12/12/2021   IR ANGIO VERTEBRAL SEL VERTEBRAL UNI L MOD SED  12/12/2021   IR ANGIO VERTEBRAL SEL VERTEBRAL UNI L MOD SED  01/09/2022   IR ANGIO VERTEBRAL SEL VERTEBRAL UNI R MOD SED  09/14/2022   IR ANGIOGRAM FOLLOW UP STUDY  01/09/2022   IR ANGIOGRAM FOLLOW UP STUDY  01/09/2022   IR ANGIOGRAM FOLLOW UP STUDY  01/09/2022   IR ANGIOGRAM FOLLOW UP STUDY  01/09/2022   IR ANGIOGRAM FOLLOW UP STUDY  01/09/2022   IR ANGIOGRAM FOLLOW UP STUDY  01/09/2022   IR ANGIOGRAM FOLLOW UP STUDY  01/09/2022   IR ANGIOGRAM FOLLOW UP STUDY  01/09/2022   IR ANGIOGRAM FOLLOW UP STUDY  01/09/2022   IR NEURO EACH ADD'L AFTER BASIC UNI LEFT (MS)  01/09/2022   IR TRANSCATH/EMBOLIZ  01/09/2022   IR US  GUIDE VASC ACCESS RIGHT  09/14/2022   IR VERTEBROPLASTY CERV/THOR BX INC UNI/BIL INC/INJECT/IMAGING  10/17/2020   IR VERTEBROPLASTY EA ADDL (T&LS) BX INC UNI/BIL INC INJECT/IMAGING  10/17/2020   RADIOLOGY WITH ANESTHESIA N/A 01/09/2022   Procedure: Aneurysm embolization;  Surgeon: Lisbeth Renshaw, MD;  Location: [facility] OR;  Service: Radiology;  Laterality: N/A;   SHOULDER HEMI-ARTHROPLASTY Right 07/22/2013   Procedure: RIGHT SHOULDER HEMI-ARTHROPLASTY;  Surgeon: Verlee Rossetti, MD;  Location: [facility] OR;  Service: Orthopedics;  Laterality: Right;   TIBIA IM NAIL INSERTION Right 07/19/2013   Procedure: INTRAMEDULLARY (IM) NAIL TIBIAL;  Surgeon: Loanne Drilling, MD;  Location: [facility] OR;  Service: Orthopedics;  Laterality: Right;   TUBAL LIGATION     UPPER GASTROINTESTINAL ENDOSCOPY        Family History  Problem Relation Age of Onset   Heart disease Mother    Lung cancer Father    Colon polyps Brother    Heart disease Brother    Psoriasis Brother    Heart disease Brother    Healthy Daughter    Healthy Daughter    Colon cancer Neg Hx    Esophageal cancer Neg Hx    Rectal cancer Neg Hx    Stomach cancer Neg Hx      Social History: Social History   Socioeconomic History   Marital status: Widowed    Spouse name: Not on file   Number of children: 2   Years of education: Not on file   Highest education level: Not on file  Occupational History   Not on file  Tobacco Use   Smoking status: Every Day    Packs/day: 1.00    Years: 40.00    Additional pack years: 0.00    Total pack years: 40.00    Types: Cigarettes   Smokeless tobacco: Never  Vaping Use   Vaping Use: Never used  Substance and Sexual Activity   Alcohol use: Not Currently   Drug use: No   Sexual activity: Not on file  Other Topics Concern   Not on file  Social History Narrative   Not on file   Social Determinants of Health   Financial Resource Strain: Not on file  Food Insecurity: Not on file  Transportation Needs: Not on file  Physical Activity: Not on file  Stress: Not on file  Social Connections: Not on file  Intimate Partner Violence: Not on file     (Not in a hospital admission)   Review of Systems  Unable to perform ROS: Intubated      Physical Exam: Physical Exam Vitals and nursing note reviewed.  Constitutional:      General: She is not in acute distress. Neck:     Vascular: No JVD.  Cardiovascular:     Rate and Rhythm: Regular rhythm. Bradycardia  present.     Heart sounds: Normal heart sounds. No murmur heard. Pulmonary:     Effort: Pulmonary effort is normal.     Breath sounds: Normal breath sounds. No wheezing or rales.  Musculoskeletal:     Right lower leg: No edema.     Left lower leg: No edema.        Imaging/tests reviewed and independently interpreted: Lab Results:   Cardiac Studies:  Telemetry 11/22/2022: Junctional bradycardia with transcutaneous pacing  EKG 11/22/2022: Junctional bradycardia 35 bpm  Echocardiogram 10/10/2020:  1. Left ventricular ejection fraction, by estimation, is 55 to 60%. The  left ventricle has normal function. The left ventricle has no  regional  wall motion abnormalities. There is moderate asymmetric hypertrophy of the  basal septal segment. The rest of  the LV segments demonstrate mild left ventricular hypertrophy. Left  ventricular diastolic parameters are consistent with Grade I diastolic  dysfunction (impaired relaxation). Elevated left atrial pressure.   2. Right ventricular systolic function is normal. The right ventricular  size is mildly enlarged. There is severely elevated pulmonary artery  systolic pressure. The estimated right ventricular systolic pressure is  60.8 mmHg.   3. Left atrial size was severely dilated.   4. Right atrial size was mildly dilated.   5. The mitral valve is abnormal. There are mildly elevated gradients with  flow acceleration noted across the mitral valve with mean gradient  at HR 77 bpm. MVA by continuity 2.7cm2 with no significant mitral stenosis.  Mild mitral valve  regurgitation.   6. The aortic valve is tricuspid. There is mild calcification of the  aortic valve. There is moderate thickening of the aortic valve. The LCC  appears fixed with trivial AR. Mild to moderate aortic valve  sclerosis/calcification is present, without any  evidence of aortic stenosis.   7. The inferior vena cava is normal in size with greater than 50%   respiratory variability, suggesting right atrial pressure of 3 mmHg.   Comparison(s): Compared to prior TTE in 2016, there is now severe  pulmonary HTN present with PASP (previously ).    Assessment & Recommendations:  72 y.o. 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 It appears that patient had worsening mental status for 10 hours. It is quite possible that she may already have had significant encephalopathy. Cr is already 2.3 suggesting hypoperfusion for a while. Nonetheless, it is reasonable to place temporary pacemaker and address her bradycardia and then address circulatory shock if it still persists. Hold beta blocker. Appreciate ICU team input to address other ongoing medical issues. She may still need pressor support, in addition to temp pacer. Check lactic acid. With her cirrhosis and portal hypertension, I do not think she is a candidate for mechanical circulatory support.   Risks, benefits, alternate options discussed with daughters [name] (primary caregiver) and [name].  PAF: Possibly sinus node dysfunction. Temp pacer while metoprolol washes out. No acute indication for pacemaker yet, but may need to discuss if mental status improves and pacing requirement continues. High CH2DS2VASc score but not on anticoagulation due to cirrhosis,portal hypertension, h/o GI bleeding.   Portal hypertension: Could be related to to cirrhosis. I do not think that is the acute insult, but will obtain echocardiogram.     Discussed interpretation of tests and management recommendations with the primary team  CRITICAL CARE Performed by: Truett Mainland   Total critical  care time: 45 minutes   Critical care time was exclusive of separately billable procedures and treating other patients.   Critical care was necessary to treat or prevent imminent or life-threatening deterioration. Patient is critically ill with multi-organ failure, prognosis guarded a this time.   Critical care was time spent personally by me on the following activities: development of treatment plan with patient and/or surrogate as well as nursing, discussions with consultants, evaluation of patient's response to treatment, examination of patient, obtaining history from patient or surrogate, ordering and performing treatments and interventions, ordering and  review of laboratory studies, ordering and review of radiographic studies, pulse oximetry and re-evaluation of patient's condition.       Elder Negus, MD Pager: [phone] Office: [phone]

## 2022-11-22 NOTE — Progress Notes (Signed)
RT pulled back ET tube to 23cm at lip per cxr results.

## 2022-11-22 NOTE — Procedures (Signed)
Intubation Procedure Note  NYSA BROSZ  [id number]  1951/02/20  Date:11/22/22  Time:9:50 PM   Provider Performing:Izaih Kataoka, Chales Abrahams    Procedure: Intubation (31500)  Indication(s) Respiratory Failure  Consent Unable to obtain consent due to emergent nature of procedure.   Anesthesia Per MD   Time Out Verified patient identification, verified procedure, site/side was marked, verified correct patient position, special equipment/implants available, medications/allergies/relevant history reviewed, required imaging and test results available.   Sterile Technique Usual hand hygeine, masks, and gloves were used   Procedure Description Patient positioned in bed supine.  Sedation given as noted above.  Patient was intubated with endotracheal tube using Glidescope.  View was Grade 4 no glottis structures visible.  Number of attempts was 1.  Colorimetric CO2 detector was consistent with tracheal placement.   Complications/Tolerance None; patient tolerated the procedure well. Chest X-ray is ordered to verify placement.   EBL Minimal   Specimen(s) None

## 2022-11-22 NOTE — ED Provider Notes (Signed)
[place] EMERGENCY DEPARTMENT AT [facility] Provider Note   CSN: [id number] Arrival date & time: 11/22/22  1811     History  No chief complaint on file.   [name] is a 72 y.o. female presenting from home with generalized weakness, slurred speech.  Patient's family called EMS after noticing the patient was increasing lethargic and appeared to have slurred speech today.  Patient does feel that her speech is slurred.  She denies headache.  EMS reports the patient had a junctional bradycardia en route to the hospital and she was given 1 mg of atropine for heart rate of approximately 30 bpm, ventricular.  Patient says that she was told she had a low heart rate in the past.  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  She has been seen by cardiology in the past including in January 2023 by Dr Tessa Lerner for concern for paroxysmal A-fib, and felt to be a poor anticoagulation candidate given her history of GI bleed and cirrhosis.  She has noted have a history of alcoholic cirrhosis.  Patient also with a history of smoking.  On prior office visits, noted to have HR of 46-53 bpm.  HPI     Home Medications Prior to Admission medications   Medication Sig Start Date End Date Taking? Authorizing Provider  albuterol (VENTOLIN HFA) 108 (90 Base) MCG/ACT inhaler Inhale 2 puffs into the lungs every 4 (four) hours as needed for wheezing or shortness of breath. 06/15/21   [provider]  aspirin EC 81 MG tablet Take 81 mg by mouth daily. Swallow whole.    [provider]  B Complex Vitamins (B COMPLEX 1 PO) Take 1 tablet by mouth daily.    [provider]  BRILINTA 90 MG TABS tablet Take 90 mg by mouth 2 (two) times daily. 12/25/21   [provider]  calcium carbonate (OSCAL) 1500 (600 Ca) MG TABS tablet Take 600 mg of elemental calcium by mouth daily with breakfast.     [provider]  citalopram (CELEXA) 20 MG tablet TAKE 1 TABLET BY MOUTH EVERY DAY. Patient taking differently: Take 20 mg by mouth daily. 03/17/14   Tyrone Nine, MD  diclofenac Sodium (VOLTAREN) 1 % GEL Apply 2 g topically 4 (four) times daily as needed (pain). 10/18/20   Zigmund Daniel., MD  folic acid (FOLVITE) 800 MCG tablet Take 800 mcg by mouth daily.    [provider]  gabapentin (NEURONTIN) 100 MG capsule Take 200 mg by mouth 2 (two) times daily. 02/21/21   [provider]  hydrALAZINE (APRESOLINE) 25 MG tablet Take 3 tablets (75 mg total) by mouth 3 (three) times daily. 10/18/20 08/24/23  Zigmund Daniel., MD  ipratropium-albuterol (DUONEB) 0.5-2.5 (3) MG/3ML SOLN Take 3 mLs by nebulization every 6 (six) hours as needed. 10/27/20   Burnadette Pop, MD  levothyroxine (SYNTHROID) 125 MCG tablet Take 125 mcg by mouth daily before breakfast. 08/18/21   [provider]  lisinopril (ZESTRIL) 40 MG tablet Take 40 mg by mouth daily. 09/26/20   [provider]  metoprolol succinate (TOPROL-XL) 50 MG 24 hr tablet TAKE 1 TABLET BY MOUTH ONCE DAILY WITH MEAL 01/25/22   Tolia, Sunit, DO  Multiple Vitamin (MULTIVITAMIN WITH MINERALS) TABS tablet Take 1 tablet by mouth daily.    [provider]  pantoprazole (PROTONIX) 40 MG tablet Take 1  tablet (40 mg total) by mouth at bedtime. 07/06/21 09/01/23  Meryl Dare, MD      Allergies    Patient has no known allergies.    Review of Systems   Review of Systems  Physical Exam Updated Vital Signs BP 107/61   Pulse (!) 38   Temp (!) 97.2 F (36.2 C)   Resp 20   Ht 5' (1.524 m)   Wt 50.3 kg   SpO2 100%   BMI 21.68 kg/m  Physical Exam Constitutional:      General: She is not in acute distress. HENT:     Head: Normocephalic and atraumatic.  Eyes:     Conjunctiva/sclera: Conjunctivae normal.     Pupils: Pupils are equal, round, and reactive to light.     Comments: Pupils are equally  reactive  Cardiovascular:     Rate and Rhythm: Regular rhythm. Bradycardia present.  Pulmonary:     Effort: Pulmonary effort is normal. No respiratory distress.  Abdominal:     General: There is no distension.     Tenderness: There is no abdominal tenderness.  Skin:    General: Skin is warm and dry.  Neurological:     Mental Status: She is alert.     Sensory: No sensory deficit.     Motor: No weakness.     Comments: Speech is slurred, otherwise normal neurological exam     ED Results / Procedures / Treatments   Labs (all labs ordered are listed, but only abnormal results are displayed) Labs Reviewed  BASIC METABOLIC PANEL - Abnormal; Notable for the following components:      Result Value   Sodium 126 (*)    Chloride 89 (*)    CO2 21 (*)    BUN 39 (*)    Creatinine, Ser 2.32 (*)    Calcium 8.4 (*)    GFR, Estimated 22 (*)    Anion gap 16 (*)    All other components within normal limits  CBC WITH DIFFERENTIAL/PLATELET - Abnormal; Notable for the following components:   RBC 3.66 (*)    Hemoglobin 11.8 (*)    HCT 33.2 (*)    Neutro Abs 8.3 (*)    All other components within normal limits  T4, FREE - Abnormal; Notable for the following components:   Free T4 1.51 (*)    All other components within normal limits  RAPID URINE DRUG SCREEN, HOSP PERFORMED - Abnormal; Notable for the following components:   Opiates POSITIVE (*)    All other components within normal limits  URINALYSIS, ROUTINE W REFLEX MICROSCOPIC - Abnormal; Notable for the following components:   Color, Urine AMBER (*)    APPearance CLOUDY (*)    Hgb urine dipstick SMALL (*)    Protein, ur 100 (*)    Leukocytes,Ua LARGE (*)    Bacteria, UA MANY (*)    Non Squamous Epithelial 0-5 (*)    All other components within normal limits  HEPATIC FUNCTION PANEL - Abnormal; Notable for the following components:   Total Protein 5.7 (*)    Albumin 3.3 (*)    AST 114 (*)    ALT 57 (*)    All other components within  normal limits  I-STAT VENOUS BLOOD GAS, ED - Abnormal; Notable for the following components:   pCO2, Ven 40.8 (*)    pO2, Ven 30 (*)    Bicarbonate 18.6 (*)    TCO2 20 (*)    Acid-base deficit 8.0 (*)  Sodium 125 (*)    Calcium, Ion 0.92 (*)    All other components within normal limits  I-STAT ARTERIAL BLOOD GAS, ED - Abnormal; Notable for the following components:   pH, Arterial 7.270 (*)    pO2, Arterial 193 (*)    Bicarbonate 18.1 (*)    TCO2 19 (*)    Acid-base deficit 8.0 (*)    Sodium 126 (*)    Calcium, Ion 1.08 (*)    HCT 34.0 (*)    Hemoglobin 11.6 (*)    All other components within normal limits  TROPONIN I (HIGH SENSITIVITY) - Abnormal; Notable for the following components:   Troponin I (High Sensitivity) 19 (*)    All other components within normal limits  TROPONIN I (HIGH SENSITIVITY) - Abnormal; Notable for the following components:   Troponin I (High Sensitivity) 20 (*)    All other components within normal limits  CULTURE, BLOOD (ROUTINE X 2)  CULTURE, BLOOD (ROUTINE X 2)  MRSA NEXT GEN BY PCR, NASAL  CULTURE, RESPIRATORY W GRAM STAIN  TSH  MAGNESIUM  AMMONIA  ETHANOL  BLOOD GAS, ARTERIAL  CBC  CREATININE, SERUM  LACTIC ACID, PLASMA  LACTIC ACID, PLASMA  BRAIN NATRIURETIC PEPTIDE  CBC  BASIC METABOLIC PANEL  MAGNESIUM  HEMOGLOBIN A1C  PROTIME-INR    EKG None  Radiology DG Chest Portable 1 View  Result Date: 11/22/2022 CLINICAL DATA:  Intubated EXAM: PORTABLE CHEST 1 VIEW COMPARISON:  11/22/2022 FINDINGS: Interval intubation, tip of the endotracheal tube is about 4 cm superior to carina. Esophageal tube tip overlies the mid stomach. Cardiomegaly. Aortic atherosclerosis. No consolidation, pleural effusion or pneumothorax IMPRESSION: Endotracheal tube tip about 4 cm superior to carina. Esophageal tube tip overlies the mid stomach, side-port near GE junction. Cardiomegaly. Electronically Signed   By: Jasmine Pang M.D.   On: 11/22/2022 22:10   DG  Abdomen 1 View  Result Date: 11/22/2022 CLINICAL DATA:  OG tube placement EXAM: ABDOMEN - 1 VIEW COMPARISON:  CT 10/22/2020 FINDINGS: Numerous leads and wires limit the exam. Overall nonobstructed gas pattern. Probable tip of esophageal tube in left upper quadrant but not definitive. Oval calcification in the pelvis. IMPRESSION: 1. Possible tip of esophageal tube in the left upper quadrant but not definitive. 2. Nonobstructed gas pattern. Electronically Signed   By: Jasmine Pang M.D.   On: 11/22/2022 22:09   DG Chest Portable 1 View  Result Date: 11/22/2022 CLINICAL DATA:  Chest discomfort EXAM: PORTABLE CHEST 1 VIEW COMPARISON:  11/22/2022 FINDINGS: Cardiomegaly. Vascular congestion. Interstitial prominence may reflect early interstitial edema. No effusions. No acute bony abnormality. Old healed bilateral rib fractures. IMPRESSION: Cardiomegaly with vascular congestion and possible early interstitial edema. Electronically Signed   By: Charlett Nose M.D.   On: 11/22/2022 21:20   CT Head Wo Contrast  Result Date: 11/22/2022 CLINICAL DATA:  Mental status change, unknown cause. Slurred speech. EXAM: CT HEAD WITHOUT CONTRAST TECHNIQUE: Contiguous axial images were obtained from the base of the skull through the vertex without intravenous contrast. RADIATION DOSE REDUCTION: This exam was performed according to the departmental dose-optimization program which includes automated exposure control, adjustment of the mA and/or kV according to patient size and/or use of iterative reconstruction technique. COMPARISON:  Head CT 10/22/2020. FINDINGS: Brain: Within limitations of streak artifact from basilar artery aneurysm coil, no acute hemorrhage, loss of gray-white differentiation, hydrocephalus or extra-axial collection. Vascular: Interval postprocedural changes of stent assisted coil embolization of the basilar artery aneurysm. Skull: No calvarial fracture or  suspicious bone lesion. Skull base is unremarkable.  Sinuses/Orbits: Unremarkable. Other: None. IMPRESSION: 1. No acute intracranial abnormality. 2. Interval stent assisted coil embolization of a basilar artery aneurysm. Electronically Signed   By: Orvan Falconer M.D.   On: 11/22/2022 19:49   DG Chest Portable 1 View  Result Date: 11/22/2022 CLINICAL DATA:  Chest discomfort EXAM: PORTABLE CHEST 1 VIEW COMPARISON:  X-ray 10/25/2020 FINDINGS: Underinflated x-ray rotated to the left with overlapping defibrillator pads and cardiac leads. Right shoulder arthroplasty seen. Augmentation cement along lower thoracic spine vertebral levels. No consolidation, pneumothorax or effusion. Mild interstitial prominence. Enlarged cardiopericardial silhouette with calcified aorta. No obvious edema. Possible old right-sided rib fractures. Osteopenia IMPRESSION: Underinflated x-ray with chronic changes. No consolidation. Enlarged heart. Electronically Signed   By: Karen Kays M.D.   On: 11/22/2022 18:53    Procedures .Critical Care  Performed by: Terald Sleeper, MD Authorized by: Terald Sleeper, MD   Critical care provider statement:    Critical care time (minutes):  80   Critical care time was exclusive of:  Separately billable procedures and treating other patients   Critical care was necessary to treat or prevent imminent or life-threatening deterioration of the following conditions:  Circulatory failure and respiratory failure   Critical care was time spent personally by me on the following activities:  Ordering and performing treatments and interventions, ordering and review of laboratory studies, ordering and review of radiographic studies, pulse oximetry, review of old charts, examination of patient and evaluation of patient's response to treatment   Care discussed with: admitting provider   External pacer  Date/Time: 11/22/2022 10:55 PM  Performed by: Terald Sleeper, MD Authorized by: Terald Sleeper, MD  Consent: The procedure was performed in an  emergent situation. Consent given by: patient and guardian Patient understanding: patient states understanding of the procedure being performed Test results: test results available and properly labeled Site marked: the operative site was marked Imaging studies: imaging studies available Required items: required blood products, implants, devices, and special equipment available Patient identity confirmed: hospital-assigned identification number and verbally with patient Time out: Immediately prior to procedure a "time out" was called to verify the correct patient, procedure, equipment, support staff and site/side marked as required.  Sedation: Patient sedated: no  Patient tolerance: patient tolerated the procedure well with no immediate complications Comments: External pacing for symptomatic bradycardia       Medications Ordered in ED Medications  0.9 %  sodium chloride infusion (has no administration in time range)  norepinephrine (LEVOPHED) 4mg  in (0.016 mg/mL) premix infusion ( Intravenous MAR Hold 11/22/22 2248)  vancomycin (VANCOCIN) IVPB 1000 mg/200 mL premix ( Intravenous MAR Hold 11/22/22 2248)  vancomycin variable dose per unstable renal function (pharmacist dosing) ( Does not apply MAR Hold 11/22/22 2248)  piperacillin-tazobactam (ZOSYN) IVPB 3.375 g ( Intravenous MAR Hold 11/22/22 2248)  piperacillin-tazobactam (ZOSYN) IVPB 2.25 g ( Intravenous Automatically Held 12/01/22 2200)  fentaNYL in NS (63mcg/ml) infusion-PREMIX (50 mcg/hr Intravenous New Bag/Given 11/22/22 2222)  fentaNYL 10 mcg/ml infusion (has no administration in time range)  polyethylene glycol (MIRALAX / GLYCOLAX) packet 17 g ( Per Tube Automatically Held 12/01/22 1000)  midazolam (VERSED) injection 1-2 mg ( Intravenous MAR Hold 11/22/22 2248)  fentaNYL (SUBLIMAZE) bolus via infusion 50-100 mcg ( Intravenous MAR Hold 11/22/22 2248)  fentaNYL (SUBLIMAZE) injection 50 mcg ( Intravenous MAR Hold 11/22/22  2248)  fentaNYL (SUBLIMAZE) injection 50-200 mcg ( Intravenous MAR Hold 11/22/22 2248)  polyethylene  glycol (MIRALAX / GLYCOLAX) packet 17 g ( Per Tube MAR Hold 11/22/22 2248)  heparin injection 5,000 Units ( Subcutaneous Automatically Held 11/30/22 2200)  famotidine (PEPCID) tablet 20 mg ( Per Tube Automatically Held 11/30/22 2200)  0.9 %  sodium chloride infusion (has no administration in time range)  insulin aspart (novoLOG) injection 0-6 Units ( Subcutaneous Automatically Held 12/01/22 2000)  magnesium sulfate IVPB 2 g 50 mL ( Intravenous MAR Hold 11/22/22 2248)  calcium gluconate 1 g/ 50 mL sodium chloride IVPB ( Intravenous Automatically Held 11/22/22 2300)  sennosides (SENOKOT) 8.8 MG/5ML syrup 5 mL ( Per Tube Automatically Held 11/30/22 2200)  sodium chloride 0.9 % bolus 1,000 mL (0 mLs Intravenous Stopped 11/22/22 2022)  atropine 1 MG/10ML injection 1 mg (1 mg Intravenous Given 11/22/22 2030)  LORazepam (ATIVAN) injection 0.5 mg (0.5 mg Intravenous Given 11/22/22 2053)  rocuronium (ZEMURON) injection 55 mg (55 mg Intravenous Given 11/22/22 2143)  etomidate (AMIDATE) injection 16 mg (16 mg Intravenous Given 11/22/22 2142)    ED Course/ Medical Decision Making/ A&P Clinical Course as of 11/22/22 2300  Thu Nov 22, 2022  1837 HR 40 bpm junctional bradycardia.  Pt has no complaint of headache, PERRL  [MT]  2004 I spoke to Dr Melton Alar from [facility] cardiology who advised holding home metoprolol, fluid bolus and admission as needed - they will be consult on the patient in the morning, but are available if worsening hemodynamics.   [MT]  2050 Pt is now being transcutaneously paced for persistent hypotension and bradycardia.  Blood pressure is improved on the pacer.  She is having some discomfort with transcutaneous pacing and a small dose of Ativan was ordered.  I have spoken again with Dr Melton Alar who is consulting with her cardiology colleague regarding possible intervention [MT]  2131 Dr Joaquim Nam coming  to place TVP.  In the meantime on my reassessment the patient has become obtunded, I am concerned she is not protecting her airway.  I consented her family members at the bedside, her daughter and grandson, for emergent intubation to protect her airway.  We would anticipate cardiology cath lab and admission after [MT]  2150 Pt intubated, on levophed for hypotension  [MT]  2152 Given the patient's worsening mental status and obtundation, and hypotension, despite pacing and HR improval, I have ordered BS antibiotics, blood cx for infection management. [MT]  2152 Pt has received 2 L IV fluids already - full 30 cc/kg bolus [MT]  2158 Pt patwardan at bedside [MT]  2229 I spoke to Dr Gaynell Face from Critical Care who will admit the patient to the medical ICU, with cardiology consulting re: cardiac pacing [MT]    Clinical Course User Index [MT] Zalma Channing, Kermit Balo, MD                             Medical Decision Making Amount and/or Complexity of Data Reviewed Labs: ordered. Radiology: ordered.  Risk OTC drugs. Prescription drug management. Decision regarding hospitalization.   This patient presents to the ED with concern for weakness, lethargy, slurred speech. This involves an extensive number of treatment options, and is a complaint that carries with it a high risk of complications and morbidity.  The differential diagnosis includes symptomatic bradycardia versus hepatic encephalopathy versus metabolic derangement versus polysubstance use or intoxication versus other  Co-morbidities that complicate the patient evaluation: Several cardiovascular risk factors, at high risk of bradycardic episodes and arrhythmias.  Additional history obtained from  EMS  External records from outside source obtained and reviewed including cardiology office evaluation and neurosurgery evaluation  I ordered and personally interpreted labs.  The pertinent results include: Troponin is low, flat, no leukocytosis, BMP  does show elevation of BUN and creatinine suggestive of prerenal injury, small anion gap.  Sodium 126.  UA with large leukocytes, negative nitrites.  Venous gas with pH 7.2, bicarb 18.6, CO2 40.   Patient's UDS is positive for opiates, she is prescribed these at home, but she does not have evidence of narcosis or opioid overdose, no pinpoint pupils.  I ordered imaging studies including CT scan of the head, x-ray of the chest I independently visualized and interpreted imaging which showed no acute intracranial lesion.  X-ray of the chest shows developing pulmonary edema pattern and cardiomegaly I agree with the radiologist interpretation  The patient was maintained on a cardiac monitor.  I personally viewed and interpreted the cardiac monitored which showed an underlying rhythm of: Junctional bradycardia  Per my interpretation the patient's ECG shows junctional bradycardia  I ordered medication including atropine for symptomatic bradycardia.  2 L fluid bolus for volume resuscitation, suspected prerenal injury or dehydration.  RSI medications with rocuronium and etomidate.  Small dose of Ativan given for anxiolysis after pacing.  Broad-spectrum antibiotics ordered via pharmacy for empiric coverage for potential infection  I have reviewed the patients home medicines and have made adjustments as needed  Test Considered: Low suspicion for meningitis or acute CVA, no evidence of intracranial hemorrhage or bleed.    I requested consultation with the cardiology,  and discussed lab and imaging findings as well as pertinent plan - they recommend: Medical admission, transvenous pacing  I have also consulted with critical care who will evaluate the patient in the hospital  After the interventions noted above, I reevaluated the patient and found that they have: worsened  Despite cutaneous external pacing, improvement of heart rate and blood pressure, the patient became increasingly obtunded,  unresponsive.  Ultimately the decision was made to intubate for airway protection.  The patient's 2 daughters were alternatively present in the ED as well as her grandson throughout her stay.  They confirmed that the patient does not have an advanced directive that they are aware of, and they requested a full CODE STATUS for now.  Dispostion:  After consideration of the diagnostic results and the patients response to treatment, I feel that the patent would benefit from medical admission.         Final Clinical Impression(s) / ED Diagnoses Final diagnoses:  Tachycardia-bradycardia syndrome (HCC)  AKI (acute kidney injury) (HCC)  Altered mental status, unspecified altered mental status type    Rx / DC Orders ED Discharge Orders     None         Azan Maneri, Kermit Balo, MD 11/22/22 2300

## 2022-11-22 NOTE — Progress Notes (Signed)
Transported pt. From Cath Lab to ICU on vent with no incident

## 2022-11-22 NOTE — ED Notes (Signed)
Patient HR continues in 30's, Patient continues to converse with staff and family.

## 2022-11-23 ENCOUNTER — Inpatient Hospital Stay (HOSPITAL_COMMUNITY): Payer: Medicare HMO

## 2022-11-23 ENCOUNTER — Encounter (HOSPITAL_COMMUNITY): Payer: Self-pay | Admitting: Cardiology

## 2022-11-23 DIAGNOSIS — R001 Bradycardia, unspecified: Secondary | ICD-10-CM

## 2022-11-23 DIAGNOSIS — R9431 Abnormal electrocardiogram [ECG] [EKG]: Secondary | ICD-10-CM | POA: Diagnosis not present

## 2022-11-23 LAB — BASIC METABOLIC PANEL
Anion gap: 11 (ref 5–15)
BUN: 37 mg/dL — ABNORMAL HIGH (ref 8–23)
CO2: 19 mmol/L — ABNORMAL LOW (ref 22–32)
Calcium: 7.6 mg/dL — ABNORMAL LOW (ref 8.9–10.3)
Chloride: 95 mmol/L — ABNORMAL LOW (ref 98–111)
Creatinine, Ser: 2.26 mg/dL — ABNORMAL HIGH (ref 0.44–1.00)
GFR, Estimated: 22 mL/min — ABNORMAL LOW (ref >60)
Glucose, Bld: 153 mg/dL — ABNORMAL HIGH (ref 70–99)
Potassium: 4.8 mmol/L (ref 3.5–5.1)
Sodium: 125 mmol/L — ABNORMAL LOW (ref 135–145)

## 2022-11-23 LAB — ECHOCARDIOGRAM COMPLETE
AV Mean grad: 11 mmHg
AV Peak grad: 19.2 mmHg
Ao pk vel: 2.19 m/s
Area-P 1/2: 3.19 cm2
Height: 60 in
MV M vel: 4.95 m/s
MV Peak grad: 97.8 mmHg
S' Lateral: 2.5 cm
Weight: 1894.19 oz

## 2022-11-23 LAB — GLUCOSE, CAPILLARY
Glucose-Capillary: 124 mg/dL — ABNORMAL HIGH (ref 70–99)
Glucose-Capillary: 129 mg/dL — ABNORMAL HIGH (ref 70–99)
Glucose-Capillary: 132 mg/dL — ABNORMAL HIGH (ref 70–99)
Glucose-Capillary: 133 mg/dL — ABNORMAL HIGH (ref 70–99)
Glucose-Capillary: 135 mg/dL — ABNORMAL HIGH (ref 70–99)
Glucose-Capillary: 136 mg/dL — ABNORMAL HIGH (ref 70–99)
Glucose-Capillary: 142 mg/dL — ABNORMAL HIGH (ref 70–99)

## 2022-11-23 LAB — CBC
HCT: 32.9 % — ABNORMAL LOW (ref 36.0–46.0)
Hemoglobin: 12.1 g/dL (ref 12.0–15.0)
MCH: 31.8 pg (ref 26.0–34.0)
MCHC: 36.8 g/dL — ABNORMAL HIGH (ref 30.0–36.0)
MCV: 86.4 fL (ref 80.0–100.0)
Platelets: 232 10*3/uL (ref 150–400)
RBC: 3.81 MIL/uL — ABNORMAL LOW (ref 3.87–5.11)
RDW: 12 % (ref 11.5–15.5)
WBC: 10.2 10*3/uL (ref 4.0–10.5)
nRBC: 0 % (ref 0.0–0.2)

## 2022-11-23 LAB — MRSA NEXT GEN BY PCR, NASAL: MRSA by PCR Next Gen: NOT DETECTED

## 2022-11-23 LAB — MAGNESIUM: Magnesium: 1.8 mg/dL (ref 1.7–2.4)

## 2022-11-23 LAB — BRAIN NATRIURETIC PEPTIDE: B Natriuretic Peptide: 2297.1 pg/mL — ABNORMAL HIGH (ref 0.0–100.0)

## 2022-11-23 LAB — ETHANOL: Alcohol, Ethyl (B): <10 mg/dL (ref <10)

## 2022-11-23 LAB — LACTIC ACID, PLASMA: Lactic Acid, Venous: 1.1 mmol/L (ref 0.5–1.9)

## 2022-11-23 LAB — PROTIME-INR
INR: 1.1 (ref 0.8–1.2)
Prothrombin Time: 14.8 seconds (ref 11.4–15.2)

## 2022-11-23 MED ORDER — VANCOMYCIN HCL 750 MG/150ML IV SOLN: 750.0000 mg | INTRAVENOUS | Status: DC

## 2022-11-23 MED ORDER — ORAL CARE MOUTH RINSE
15.0000 mL | OROMUCOSAL | Status: DC
  Administered 2022-11-23 – 2022-11-24 (×20): 15 mL via OROMUCOSAL

## 2022-11-23 MED ORDER — FAMOTIDINE 20 MG PO TABS: 20.0000 mg | ORAL_TABLET | Freq: Every day | ORAL | Status: DC

## 2022-11-23 MED ORDER — CHLORHEXIDINE GLUCONATE CLOTH 2 % EX PADS
6.0000 | MEDICATED_PAD | Freq: Every day | CUTANEOUS | Status: DC
  Administered 2022-11-23 – 2022-11-27 (×6): 6 via TOPICAL

## 2022-11-23 MED ORDER — ORAL CARE MOUTH RINSE: 15.0000 mL | OROMUCOSAL | Status: DC | PRN

## 2022-11-23 MED ORDER — NOREPINEPHRINE 4 MG/250ML-% IV SOLN
0.0000 ug/min | INTRAVENOUS | Status: DC
  Administered 2022-11-23: 10 ug/min via INTRAVENOUS
  Administered 2022-11-24: 5 ug/min via INTRAVENOUS
  Administered 2022-11-24: 10 ug/min via INTRAVENOUS
  Filled 2022-11-23 (×3): qty 250

## 2022-11-23 NOTE — Progress Notes (Signed)
  Echocardiogram 2D Echocardiogram has been performed.  Milda Smart 11/23/2022, 3:49 PM

## 2022-11-23 NOTE — Progress Notes (Signed)
eLink Physician-Brief Progress Note Patient Name: [name] DOB: 01-28-1951 MRN: [id number]   Date of Service  11/23/2022  HPI/Events of Note  72 year old female who was brought to the ED after her daughter found her disoriented and diaphoretic.  Noted to be bradycardic and hypotensive.  Intubated emergently in the ED for unresponsiveness.  eICU Interventions  For transvenous pacemaker placement in Cath Lab. Continue invasive mechanical ventilation. Hopefully mental status will improve after hemodynamics have been stabilized and she can then be extubated.     Intervention Category Evaluation Type: New Patient Evaluation  Carilyn Goodpasture 11/23/2022, 6:46 AM

## 2022-11-23 NOTE — Progress Notes (Signed)
Subjective:  Patient seen and examined at bedside, appears relatively comfortable on vent. TVP in RIJ pacing at 70 bpm.  Intake/Output from previous day:  I/O last 3 completed shifts: In: 713.6 [I.V.:363.6; IV Piggyback:349.9] Out: 275 [Urine:75; Emesis/NG output:200] No intake/output data recorded. Net IO Since Admission: 438.55 mL [11/23/22 0848]  Blood pressure 100/64, pulse 70, temperature 99.1 F (37.3 C), resp. rate 20, height 5' (1.524 m), weight 53.7 kg, SpO2 100 %. Physical Exam Vitals reviewed.  Constitutional:      Interventions: She is sedated and intubated.  HENT:     Head: Normocephalic and atraumatic.  Cardiovascular:     Rate and Rhythm: Normal rate and regular rhythm.     Heart sounds: Murmur heard.  Pulmonary:     Effort: She is intubated.     Comments: Mechanical breath sounds Abdominal:     General: Bowel sounds are normal.  Musculoskeletal:     Right lower leg: No edema.     Left lower leg: No edema.  Skin:    General: Skin is warm and dry.     Lab Results: Lab Results  Component Value Date   NA 125 (L) 11/23/2022   K 4.8 11/23/2022   CO2 19 (L) 11/23/2022   GLUCOSE 153 (H) 11/23/2022   BUN 37 (H) 11/23/2022   CREATININE 2.26 (H) 11/23/2022   CALCIUM 7.6 (L) 11/23/2022   GFRNONAA 22 (L) 11/23/2022    BNP (last 3 results) Recent Labs    11/23/22 0137  BNP 2,297.1*    ProBNP (last 3 results) No results for input(s): "PROBNP" in the last 8760 hours.    Latest Ref Rng & Units 11/23/2022    1:40 AM 11/22/2022   10:29 PM 11/22/2022    9:25 PM  BMP  Glucose 70 - 99 mg/dL 161     BUN 8 - 23 mg/dL 37     Creatinine 0.96 - 1.00 mg/dL 0.45     Sodium 409 - 811 mmol/L 125  126  125   Potassium 3.5 - 5.1 mmol/L 4.8  5.0  5.1   Chloride 98 - 111 mmol/L 95     CO2 22 - 32 mmol/L 19     Calcium 8.9 - 10.3 mg/dL 7.6         Latest Ref Rng & Units 11/22/2022    6:39 PM 01/05/2022    2:29 PM 10/23/2020    5:10 AM  Hepatic Function  Total Protein  6.5 - 8.1 g/dL 5.7  6.7  5.8   Albumin 3.5 - 5.0 g/dL 3.3  4.2  3.3   AST 15 - 41 U/L 114  18  23   ALT 0 - 44 U/L 57  13  18   Alk Phosphatase 38 - 126 U/L 66  65  76   Total Bilirubin 0.3 - 1.2 mg/dL 0.6  2.0  0.9   Bilirubin, Direct 0.0 - 0.2 mg/dL 0.2         Latest Ref Rng & Units 11/23/2022    1:40 AM 11/22/2022   10:29 PM 11/22/2022    9:25 PM  CBC  WBC 4.0 - 10.5 K/uL 10.2     Hemoglobin 12.0 - 15.0 g/dL 91.4  78.2  95.6   Hematocrit 36.0 - 46.0 % 32.9  34.0  36.0   Platelets 150 - 400 K/uL 232      Lipid Panel     Component Value Date/Time   CHOL 139 07/09/2018 1114   TRIG  75 07/09/2018 1114   HDL 58 07/09/2018 1114   CHOLHDL 2.4 07/09/2018 1114   VLDL 26 12/06/2015 0426   LDLCALC 65 07/09/2018 1114   Cardiac Panel (last 3 results) No results for input(s): "CKTOTAL", "CKMB", "TROPONINI", "RELINDX" in the last 72 hours.  HEMOGLOBIN A1C Lab Results  Component Value Date   HGBA1C 4.7 (L) 10/12/2020   MPG 88.19 10/12/2020   TSH Recent Labs    11/22/22 1839  TSH 0.955   Imaging: Imaging results have been reviewed and DG Chest Port 1 View  Result Date: 11/23/2022 CLINICAL DATA:  Temporary transvenous cardiac pacemaker EXAM: PORTABLE CHEST 1 VIEW COMPARISON:  11/22/2022 FINDINGS: Transvenous pacer in place with tip in the right ventricle. Endotracheal tube and NG tube are unchanged. Cardiomegaly. No confluent airspace opacities, effusions or edema. No acute bony abnormality. IMPRESSION: Cardiomegaly.  No active disease. Electronically Signed   By: Charlett Nose M.D.   On: 11/23/2022 03:22   US RENAL  Result Date: 11/23/2022 CLINICAL DATA:  [id number] with acute kidney injury. EXAM: RENAL / URINARY TRACT ULTRASOUND COMPLETE COMPARISON:  CT with IV contrast 10/22/2020 FINDINGS: Right Kidney: Renal measurements: 10.1 x 4.1 x 4.4 cm = volume: 94.6 mL. Echogenicity within normal limits. No mass or hydronephrosis visualized. There is a 1.1 cm nonobstructive caliceal stone again  noted in the inferior pole. Left Kidney: Renal measurements: 9.5 x 4.3 x 4.4 cm = volume: 95.3 mL. Echogenicity within normal limits. No mass, stones or hydronephrosis visualized. A 2.3 cm Bosniak 1 simple cyst is again noted arising from the inferior pole of this kidney. No follow-up imaging is recommended. Bladder: Contracted and obscured by bowel gas. Other: No free fluid. IMPRESSION: 1. No acute findings. No hydronephrosis. No increased cortical echogenicity bilaterally. 2. 1.1 cm nonobstructive caliceal stone in the inferior pole of the right kidney. 3. 2.3 cm Bosniak 1 simple cyst arising from the inferior pole left kidney. 4. Bladder is contracted and obscured by bowel gas. Electronically Signed   By: Almira Bar M.D.   On: 11/23/2022 01:33   CARDIAC CATHETERIZATION  Result Date: 11/23/2022 Images from the original result were not included. 1. Ultrasound guided Rt IJ venous access 2. Temporary transvenous pacemaker placement Elder Negus, MD Pager: [phone] Office: [phone]  DG Chest Portable 1 View  Result Date: 11/22/2022 CLINICAL DATA:  Intubated EXAM: PORTABLE CHEST 1 VIEW COMPARISON:  11/22/2022 FINDINGS: Interval intubation, tip of the endotracheal tube is about 4 cm superior to carina. Esophageal tube tip overlies the mid stomach. Cardiomegaly. Aortic atherosclerosis. No consolidation, pleural effusion or pneumothorax IMPRESSION: Endotracheal tube tip about 4 cm superior to carina. Esophageal tube tip overlies the mid stomach, side-port near GE junction. Cardiomegaly. Electronically Signed   By: Jasmine Pang M.D.   On: 11/22/2022 22:10   DG Abdomen 1 View  Result Date: 11/22/2022 CLINICAL DATA:  OG tube placement EXAM: ABDOMEN - 1 VIEW COMPARISON:  CT 10/22/2020 FINDINGS: Numerous leads and wires limit the exam. Overall nonobstructed gas pattern. Probable tip of esophageal tube in left upper quadrant but not definitive. Oval calcification in the pelvis. IMPRESSION: 1.  Possible tip of esophageal tube in the left upper quadrant but not definitive. 2. Nonobstructed gas pattern. Electronically Signed   By: Jasmine Pang M.D.   On: 11/22/2022 22:09   DG Chest Portable 1 View  Result Date: 11/22/2022 CLINICAL DATA:  Chest discomfort EXAM: PORTABLE CHEST 1 VIEW COMPARISON:  11/22/2022 FINDINGS: Cardiomegaly. Vascular congestion. Interstitial prominence may reflect early interstitial  edema. No effusions. No acute bony abnormality. Old healed bilateral rib fractures. IMPRESSION: Cardiomegaly with vascular congestion and possible early interstitial edema. Electronically Signed   By: Charlett Nose M.D.   On: 11/22/2022 21:20   CT Head Wo Contrast  Result Date: 11/22/2022 CLINICAL DATA:  Mental status change, unknown cause. Slurred speech. EXAM: CT HEAD WITHOUT CONTRAST TECHNIQUE: Contiguous axial images were obtained from the base of the skull through the vertex without intravenous contrast. RADIATION DOSE REDUCTION: This exam was performed according to the departmental dose-optimization program which includes automated exposure control, adjustment of the mA and/or kV according to patient size and/or use of iterative reconstruction technique. COMPARISON:  Head CT 10/22/2020. FINDINGS: Brain: Within limitations of streak artifact from basilar artery aneurysm coil, no acute hemorrhage, loss of gray-white differentiation, hydrocephalus or extra-axial collection. Vascular: Interval postprocedural changes of stent assisted coil embolization of the basilar artery aneurysm. Skull: No calvarial fracture or suspicious bone lesion. Skull base is unremarkable. Sinuses/Orbits: Unremarkable. Other: None. IMPRESSION: 1. No acute intracranial abnormality. 2. Interval stent assisted coil embolization of a basilar artery aneurysm. Electronically Signed   By: Orvan Falconer M.D.   On: 11/22/2022 19:49   DG Chest Portable 1 View  Result Date: 11/22/2022 CLINICAL DATA:  Chest discomfort EXAM:  PORTABLE CHEST 1 VIEW COMPARISON:  X-ray 10/25/2020 FINDINGS: Underinflated x-ray rotated to the left with overlapping defibrillator pads and cardiac leads. Right shoulder arthroplasty seen. Augmentation cement along lower thoracic spine vertebral levels. No consolidation, pneumothorax or effusion. Mild interstitial prominence. Enlarged cardiopericardial silhouette with calcified aorta. No obvious edema. Possible old right-sided rib fractures. Osteopenia IMPRESSION: Underinflated x-ray with chronic changes. No consolidation. Enlarged heart. Electronically Signed   By: Karen Kays M.D.   On: 11/22/2022 18:53    Cardiac Studies:  Telemetry 11/23/2022: transvenous packing at rate of 70 bpm   EKG 11/22/2022: Junctional bradycardia 35 bpm   Echocardiogram 10/10/2020:  1. Left ventricular ejection fraction, by estimation, is 55 to 60%. The  left ventricle has normal function. The left ventricle has no regional  wall motion abnormalities. There is moderate asymmetric hypertrophy of the  basal septal segment. The rest of  the LV segments demonstrate mild left ventricular hypertrophy. Left  ventricular diastolic parameters are consistent with Grade I diastolic  dysfunction (impaired relaxation). Elevated left atrial pressure.   2. Right ventricular systolic function is normal. The right ventricular  size is mildly enlarged. There is severely elevated pulmonary artery  systolic pressure. The estimated right ventricular systolic pressure is  60.8 mmHg.   3. Left atrial size was severely dilated.   4. Right atrial size was mildly dilated.   5. The mitral valve is abnormal. There are mildly elevated gradients with  flow acceleration noted across the mitral valve with mean gradient  at HR 77 bpm. MVA by continuity 2.7cm2 with no significant mitral stenosis.  Mild mitral valve  regurgitation.   6. The aortic valve is tricuspid. There is mild calcification of the  aortic valve. There is moderate  thickening of the aortic valve. The LCC  appears fixed with trivial AR. Mild to moderate aortic valve  sclerosis/calcification is present, without any  evidence of aortic stenosis.   7. The inferior vena cava is normal in size with greater than 50%  respiratory variability, suggesting right atrial pressure of 3 mmHg.    Recent Results (from the past 09811 hour(s))  ECHOCARDIOGRAM COMPLETE   Collection Time: 10/10/20  9:54 AM  Result Value  Weight 2,871.27   Height 64   BP 174/92   S' Lateral 3.00   Area-P 1/2 5.66   Narrative      ECHOCARDIOGRAM REPORT       Patient Name:   KEONA MUHA Morriss Date of Exam: 10/10/2020 Medical Rec #:  [id number]         Height:       64.0 in Accession #:    [id number]        Weight:       179.5 lb Date of Birth:  [birth year]         BSA:          1.868 m Patient Age:    69 years          BP:           174/92 mmHg Patient Gender: F                 HR:           72 bpm. Exam Location:  Inpatient  Procedure: 2D Echo, Cardiac Doppler and Color Doppler  Indications:    Cardiomyopathy-Unspecified I42.9   History:        Patient has prior history of Echocardiogram examinations, most                 recent 06/14/2015. Risk Factors:Hypertension.   Sonographer:    Eulah Pont RDCS Referring Phys: [id number] ANKIT CHIRAG AMIN  IMPRESSIONS    1. Left ventricular ejection fraction, by estimation, is 55 to 60%. The left ventricle has normal function. The left ventricle has no regional wall motion abnormalities. There is moderate asymmetric hypertrophy of the basal septal segment. The rest of  the LV segments demonstrate mild left ventricular hypertrophy. Left ventricular diastolic parameters are consistent with Grade I diastolic dysfunction (impaired relaxation). Elevated left atrial pressure.  2. Right ventricular systolic function is normal. The right ventricular size is mildly enlarged. There is severely elevated pulmonary artery systolic pressure. The  estimated right ventricular systolic pressure is 60.8 mmHg.  3. Left atrial size was severely dilated.  4. Right atrial size was mildly dilated.  5. The mitral valve is abnormal. There are mildly elevated gradients with flow acceleration noted across the mitral valve with mean gradient at HR 77bpm. MVA by continuity 2.7cm2 with no significant mitral stenosis. Mild mitral valve  regurgitation.  6. The aortic valve is tricuspid. There is mild calcification of the aortic valve. There is moderate thickening of the aortic valve. The LCC appears fixed with trivial AR. Mild to moderate aortic valve sclerosis/calcification is present, without any  evidence of aortic stenosis.  7. The inferior vena cava is normal in size with greater than 50% respiratory variability, suggesting right atrial pressure of 3 mmHg.  Comparison(s): Compared to prior TTE in 2016, there is now severe pulmonary HTN present with PASP (previously ).  FINDINGS  Left Ventricle: Left ventricular ejection fraction, by estimation, is 55 to 60%. The left ventricle has normal function. The left ventricle has no regional wall motion abnormalities. The left ventricular internal cavity size was normal in size. There is  moderate asymmetric hypertrophy of the basal septal segment. The rest of the LV segments demonstrate mild left ventricular hypertrophy. Left ventricular diastolic parameters are consistent with Grade I diastolic dysfunction (impaired relaxation).  Elevated left atrial pressure.  Right Ventricle: The right ventricular size is mildly enlarged. No increase in right ventricular wall thickness. Right ventricular systolic function is  normal. There is severely elevated pulmonary artery systolic pressure. The tricuspid regurgitant  velocity is 3.80 m/s, and with an assumed right atrial pressure of 3 mmHg, the estimated right ventricular systolic pressure is 60.8 mmHg.  Left Atrium: Left atrial size was severely  dilated.  Right Atrium: Right atrial size was mildly dilated.  Pericardium: There is no evidence of pericardial effusion.  Mitral Valve: There are mildly elevated gradients with flow acceleration noted across the mitral valve with mean gradient at HR 77bpm. MVA by continuity 2.7cm2 with no significant stenosis. The mitral valve is abnormal. There is moderate thickening  of the mitral valve leaflet(s). There is moderate calcification of the mitral valve leaflet(s). Moderate mitral annular calcification. Mild mitral valve regurgitation. No evidence of mitral valve stenosis.  Tricuspid Valve: The tricuspid valve is normal in structure. Tricuspid valve regurgitation is mild.  Aortic Valve: The aortic valve is tricuspid. There is mild calcification of the aortic valve. There is moderate thickening of the aortic valve. Aortic valve regurgitation LCC is fixed with trivial AR. Mild to moderate aortic valve sclerosis/calcification  is present, without any evidence of aortic stenosis.  Pulmonic Valve: The pulmonic valve was normal in structure. Pulmonic valve regurgitation is trivial.  Aorta: The aortic root and ascending aorta are structurally normal, with no evidence of dilitation.  Venous: The inferior vena cava is normal in size with greater than 50% respiratory variability, suggesting right atrial pressure of 3 mmHg.  IAS/Shunts: No atrial level shunt detected by color flow Doppler.    LEFT VENTRICLE PLAX 2D LVIDd:         4.50 cm  Diastology LVIDs:         3.00 cm  LV e' medial:    5.33 cm/s LV PW:         1.10 cm  LV E/e' medial:  22.7 LV IVS:        1.10 cm  LV e' lateral:   7.80 cm/s LVOT diam:     2.10 cm  LV E/e' lateral: 15.5 LV SV:         103 LV SV Index:   55 LVOT Area:     3.46 cm    RIGHT VENTRICLE RV S prime:     17.40 cm/s TAPSE (M-mode): 2.6 cm  LEFT ATRIUM             Index       RIGHT ATRIUM           Index LA diam:        5.60 cm 3.00 cm/m  RA Area:      19.20 cm LA Vol (A2C):   86.0 ml 46.03 ml/m RA Volume:   54.70 ml  29.28 ml/m LA Vol (A4C):   90.6 ml 48.49 ml/m LA Biplane Vol: 88.2 ml 47.21 ml/m  AORTIC VALVE LVOT Vmax:   119.00 cm/s LVOT Vmean:  78.800 cm/s LVOT VTI:    0.296 m   AORTA Ao Root diam: 3.00 cm Ao Asc diam:  3.30 cm  MITRAL VALVE                TRICUSPID VALVE MV Area (PHT): 5.66 cm     TR Peak grad:   57.8 mmHg MV Decel Time: 134 msec     TR Vmax:        380.00 cm/s MV E velocity: 121.00 cm/s MV A velocity: 144.00 cm/s  SHUNTS MV E/A ratio:  0.84  Systemic VTI:  0.30 m                             Systemic Diam: 2.10 cm  Laurance Flatten MD Electronically signed by Laurance Flatten MD Signature Date/Time: 10/10/2020/12:13:50 PM       Final     *Note: Due to a large number of results and/or encounters for the requested time period, some results have not been displayed. A complete set of results can be found in Results Review.    Scheduled Meds:  Chlorhexidine Gluconate Cloth  6 each Topical Daily   famotidine  20 mg Per Tube BID   heparin  5,000 Units Subcutaneous Q8H   insulin aspart  0-6 Units Subcutaneous Q4H   levothyroxine  112 mcg Per Tube Daily   mouth rinse  15 mL Mouth Rinse Q2H   polyethylene glycol  17 g Per Tube Daily   sennosides  5 mL Per Tube BID   Continuous Infusions:  sodium chloride 10 mL/hr at 11/23/22 0600   sodium chloride 50 mL/hr at 11/23/22 0600   fentaNYL     fentaNYL infusion INTRAVENOUS 50 mcg/hr (11/23/22 0600)   norepinephrine (LEVOPHED) Adult infusion 6 mcg/min (11/23/22 0600)   piperacillin-tazobactam (ZOSYN)  IV Stopped (11/23/22 0227)   [START ON 11/24/2022] vancomycin     PRN Meds:.fentaNYL, fentaNYL (SUBLIMAZE) injection, fentaNYL (SUBLIMAZE) injection, fentaNYL, midazolam, mouth rinse, polyethylene glycol  Assessment & Plan:  [name] is a 72 y.o. 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 She is s/p emergent TVP.  Circulatory shock, encephalopathy Ventilator dependent respiratory failure Symptomatic bradycardia.s/p TVP Continue to hold beta blocker and AV nodal blocking agents.  Appreciate ICU team input to address other ongoing medical issues and vent management.  She remains on levophed. TVP is pacing at 70 bpm With her cirrhosis and portal hypertension, she is not a candidate for mechanical circulatory support.      PAF: Possibly sinus node dysfunction. Temp pacer while metoprolol washes out. No acute indication for pacemaker yet, but may need to discuss if mental status improves and pacing requirement continues. High CH2DS2VASc score but not on anticoagulation due to cirrhosis, portal hypertension, h/o GI bleeding.     Portal hypertension: Likely due to liver cirrhosis     Clotilde Dieter, DO 11/23/2022, 8:48 AM Office: [phone] Fax: [phone] Pager: [phone]

## 2022-11-23 NOTE — Consult Note (Addendum)
Cardiology Consultation   Patient ID: [name] MRN: [id number]; DOB: May 07, 1951  Admit date: 11/22/2022 Date of Consult: 11/23/2022  PCP:  Margot Ables, MD (Inactive)   [facility] Providers Cardiologist:  None   {   Patient Profile:   [name] is a 72 y.o. 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 Melton Alar.  History of Present Illness:   [name] in review of chart (pt remain intubated), was in her USOH yesterday morning, later in the day found confused, disoriented, diaphoretic, EMS was called, she was bradycardic/hypotensive. Transcutaneous pacing initiated, in the ER MS continued to decline reportedly with HR 70's via transcutaneous pacing, and was intubated. She is on metoprolol succ 50mg  daily. She was in AKI w/creat 2.32 (baseline less then 1), hypocalcemic, and elevated LFTs noted  Temp transvenous wire placed Home BB held Started on levo Admitted to ICU Head CT OK  EP is asked to see her today for consideration of PPM  LABS NA 126 >> 125 Ca++ 8.4 >> 7.6 K+ 5.0 > 4.8 Mag 1.9, 1.8 BUN/Creat 39/2.32 > 2.26 (baseline 0.9-1) AST 114/ALT 57 HS trop 19, 20 BNP 2297  Venous BG 7.266 PCO2 40.8 PO2 30 Bicarb 18.6 Sat 49  Art BG 7.270 PCO239 PO2 193 Bicarb 18.1 Sat 100  She remains intubated, she is afebrile, on levo @ 4 Zosyn, vanc  Echo is pending  She remains intubated, no plans to attempt extubation today She opens her eyes, tracks, RN reports follows commands, on some fentanyl currently  Past Medical History:  Diagnosis Date   [place] 03/16/2010   Arthritis    Atrial fibrillation (HCC)     Chronic kidney disease    III   COPD (chronic obstructive pulmonary disease) (HCC)    GI bleeding 06/2018   Heart murmur    pt had echo 10/10/20   Hypertension    Hypothyroidism    Liver cirrhosis (HCC)    Multiple duodenal ulcers 07/16/2018   associated with melena, anemia, gastritis. due to NSAID.  hx gastric ulcers and bleeding 2004, 2007   Thyroid disease    S/p Radiation    Past Surgical History:  Procedure Laterality Date   BIOPSY  07/16/2018   Procedure: BIOPSY;  Surgeon: Napoleon Form, MD;  Location: [facility];  Service: Endoscopy;;   BREAST EXCISIONAL BIOPSY Left    benign   CHOLECYSTECTOMY N/A 12/07/2015   Procedure: LAPAROSCOPIC CHOLECYSTECTOMY WITH INTRAOPERATIVE CHOLANGIOGRAM;  Surgeon: Ovidio Kin, MD;  Location: [facility] ORS;  Service: General;  Laterality: N/A;   COLONOSCOPY     ERCP N/A 12/06/2015   Procedure: ENDOSCOPIC RETROGRADE CHOLANGIOPANCREATOGRAPHY (ERCP);  Surgeon: Rachael Fee, MD;  Location: [facility];  Service: Endoscopy;  Laterality: N/A;   ESOPHAGOGASTRODUODENOSCOPY (EGD) WITH PROPOFOL N/A 07/16/2018   Procedure: ESOPHAGOGASTRODUODENOSCOPY (EGD) WITH PROPOFOL;  Surgeon: Napoleon Form, MD;  Location: [facility];  Service: Endoscopy;  Laterality: N/A;   IM NAILING TIBIA Right 07/19/2012   DR Despina Hick   IR ANGIO INTRA EXTRACRAN SEL COM CAROTID INNOMINATE BILAT MOD SED  12/12/2021   IR ANGIO VERTEBRAL SEL VERTEBRAL UNI L MOD SED  12/12/2021   IR ANGIO VERTEBRAL SEL VERTEBRAL UNI L  MOD SED  01/09/2022   IR ANGIO VERTEBRAL SEL VERTEBRAL UNI R MOD SED  09/14/2022   IR ANGIOGRAM FOLLOW UP STUDY  01/09/2022   IR ANGIOGRAM FOLLOW UP STUDY  01/09/2022   IR ANGIOGRAM FOLLOW UP STUDY  01/09/2022   IR ANGIOGRAM FOLLOW UP STUDY  01/09/2022   IR ANGIOGRAM FOLLOW UP STUDY  01/09/2022   IR ANGIOGRAM FOLLOW UP STUDY  01/09/2022   IR ANGIOGRAM FOLLOW UP STUDY  01/09/2022   IR ANGIOGRAM FOLLOW UP STUDY  01/09/2022   IR ANGIOGRAM FOLLOW UP STUDY  01/09/2022   IR  NEURO EACH ADD'L AFTER BASIC UNI LEFT (MS)  01/09/2022   IR TRANSCATH/EMBOLIZ  01/09/2022   IR US GUIDE VASC ACCESS RIGHT  09/14/2022   IR VERTEBROPLASTY CERV/THOR BX INC UNI/BIL INC/INJECT/IMAGING  10/17/2020   IR VERTEBROPLASTY EA ADDL (T&LS) BX INC UNI/BIL INC INJECT/IMAGING  10/17/2020   RADIOLOGY WITH ANESTHESIA N/A 01/09/2022   Procedure: Aneurysm embolization;  Surgeon: Lisbeth Renshaw, MD;  Location: [facility] OR;  Service: Radiology;  Laterality: N/A;   SHOULDER HEMI-ARTHROPLASTY Right 07/22/2013   Procedure: RIGHT SHOULDER HEMI-ARTHROPLASTY;  Surgeon: Verlee Rossetti, MD;  Location: [facility] OR;  Service: Orthopedics;  Laterality: Right;   TEMPORARY PACEMAKER N/A 11/22/2022   Procedure: TEMPORARY PACEMAKER;  Surgeon: Elder Negus, MD;  Location: [facility] INVASIVE CV [facility];  Service: Cardiovascular;  Laterality: N/A;   TIBIA IM NAIL INSERTION Right 07/19/2013   Procedure: INTRAMEDULLARY (IM) NAIL TIBIAL;  Surgeon: Loanne Drilling, MD;  Location: [facility] OR;  Service: Orthopedics;  Laterality: Right;   TUBAL LIGATION     UPPER GASTROINTESTINAL ENDOSCOPY       Home Medications:  Prior to Admission medications   Medication Sig Start Date End Date Taking? Authorizing Provider  acetaminophen (TYLENOL) 500 MG tablet Take 1,000 mg by mouth as needed for moderate pain.   Yes [provider]  albuterol (PROVENTIL) (2.5 MG/3ML) 0.083% nebulizer solution Take 2.5 mg by nebulization every 6 (six) hours as needed for wheezing or shortness of breath.   Yes [provider]  albuterol (VENTOLIN HFA) 108 (90 Base) MCG/ACT inhaler Inhale 2 puffs into the lungs every 4 (four) hours as needed for wheezing or shortness of breath. 06/15/21  Yes [provider]  amLODipine (NORVASC) 10 MG tablet Take 10 mg by mouth daily. 10/10/22  Yes [provider]  aspirin EC 81 MG tablet Take 81 mg by mouth daily. Swallow whole.   Yes [provider]  calcium carbonate (OSCAL) 1500 (600 Ca) MG  TABS tablet Take 1,200 mg of elemental calcium by mouth daily with breakfast.   Yes [provider]  citalopram (CELEXA) 20 MG tablet TAKE 1 TABLET BY MOUTH EVERY DAY. Patient taking differently: Take 20 mg by mouth daily. 03/17/14  Yes Tyrone Nine, MD  DULoxetine (CYMBALTA) 30 MG capsule Take 30 mg by mouth daily. 11/05/22  Yes [provider]  folic acid (FOLVITE) 800 MCG tablet Take 800 mcg by mouth daily.   Yes [provider]  gabapentin (NEURONTIN) 300 MG capsule Take 300 mg by mouth 2 (two) times daily.   Yes [provider]  ibuprofen (ADVIL) 200 MG tablet Take 400 mg by mouth as needed for moderate pain.   Yes [provider]  levothyroxine (SYNTHROID) 112 MCG tablet Take 112 mcg by mouth daily before breakfast.   Yes [provider]  lidocaine (LIDODERM) 5 % Place 1 patch onto the skin daily as needed (pain).  Yes [provider]  lisinopril (ZESTRIL) 40 MG tablet Take 40 mg by mouth daily. 09/26/20  Yes [provider]  metoprolol succinate (TOPROL-XL) 50 MG 24 hr tablet TAKE 1 TABLET BY MOUTH ONCE DAILY WITH MEAL Patient taking differently: Take 50 mg by mouth daily. 01/25/22  Yes Tolia, Sunit, DO  Multiple Vitamin (MULTIVITAMIN WITH MINERALS) TABS tablet Take 1 tablet by mouth daily.   Yes [provider]  naloxone (NARCAN) nasal spray 4 mg/0.1 mL Place 0.4 mg into the nose once. 11/02/22  Yes [provider]  oxyCODONE-acetaminophen (PERCOCET) 7.5-325 MG tablet Take 1 tablet by mouth 3 (three) times daily. 11/09/22  Yes [provider]  Vitamin D, Ergocalciferol, (DRISDOL) 1.25 MG (50000 UNIT) CAPS capsule Take 50,000 Units by mouth once a week. 11/02/22  Yes [provider]  gabapentin (NEURONTIN) 100 MG capsule Take 200 mg by mouth 2 (two) times daily. Patient not taking: Reported on 11/23/2022 02/21/21   [provider]  hydrALAZINE (APRESOLINE) 25 MG tablet Take 3 tablets  (75 mg total) by mouth 3 (three) times daily. Patient not taking: Reported on 11/23/2022 10/18/20 08/24/23  Zigmund Daniel., MD  levothyroxine (SYNTHROID) 125 MCG tablet Take 125 mcg by mouth daily before breakfast. Patient not taking: Reported on 11/23/2022 08/18/21   [provider]  pantoprazole (PROTONIX) 40 MG tablet Take 1 tablet (40 mg total) by mouth at bedtime. Patient not taking: Reported on 11/23/2022 07/06/21 09/01/23  Meryl Dare, MD    Inpatient Medications: Scheduled Meds:  Chlorhexidine Gluconate Cloth  6 each Topical Daily   [START ON 11/24/2022] famotidine  20 mg Per Tube Daily   heparin  5,000 Units Subcutaneous Q8H   insulin aspart  0-6 Units Subcutaneous Q4H   levothyroxine  112 mcg Per Tube Daily   mouth rinse  15 mL Mouth Rinse Q2H   polyethylene glycol  17 g Per Tube Daily   sennosides  5 mL Per Tube BID   Continuous Infusions:  sodium chloride 10 mL/hr at 11/23/22 1200   sodium chloride 50 mL/hr at 11/23/22 1200   fentaNYL infusion INTRAVENOUS 50 mcg/hr (11/23/22 1200)   norepinephrine (LEVOPHED) Adult infusion 4 mcg/min (11/23/22 1200)   piperacillin-tazobactam (ZOSYN)  IV Stopped (11/23/22 0227)   [START ON 11/24/2022] vancomycin     PRN Meds: fentaNYL, fentaNYL (SUBLIMAZE) injection, fentaNYL (SUBLIMAZE) injection, midazolam, mouth rinse, polyethylene glycol  Allergies:   No Known Allergies  Social History:   Social History   Socioeconomic History   Marital status: Widowed    Spouse name: Not on file   Number of children: 2   Years of education: Not on file   Highest education level: Not on file  Occupational History   Not on file  Tobacco Use   Smoking status: Every Day    Packs/day: 1.00    Years: 40.00    Additional pack years: 0.00    Total pack years: 40.00    Types: Cigarettes   Smokeless tobacco: Never  Vaping Use   Vaping Use: Never used  Substance and Sexual Activity   Alcohol use: Not Currently   Drug use: No    Sexual activity: Not on file  Other Topics Concern   Not on file  Social History Narrative   Not on file   Social Determinants of Health   Financial Resource Strain: Not on file  Food Insecurity: Not on file  Transportation Needs: Not on file  Physical Activity: Not on file  Stress: Not on file  Social Connections: Not on file  Intimate Partner Violence: Not on file    Family History:   Family History  Problem Relation Age of Onset   Heart disease Mother    Lung cancer Father    Colon polyps Brother    Heart disease Brother    Psoriasis Brother    Heart disease Brother    Healthy Daughter    Healthy Daughter    Colon cancer Neg Hx    Esophageal cancer Neg Hx    Rectal cancer Neg Hx    Stomach cancer Neg Hx      ROS:  Please see the history of present illness.  All other ROS reviewed and negative.     Physical Exam/Data:   Vitals:   11/23/22 1000 11/23/22 1100 11/23/22 1125 11/23/22 1200  BP: 93/61 93/64  (!) 84/67  Pulse: 70 70  71  Resp: (!) 23 (!) 25 20 (!) 22  Temp: 99.5 F (37.5 C) 99.5 F (37.5 C)  99.5 F (37.5 C)  TempSrc:    Bladder  SpO2: 100% 99%  99%  Weight:      Height:        Intake/Output Summary (Last 24 hours) at 11/23/2022 1219 Last data filed at 11/23/2022 1200 Gross per 24 hour  Intake 1231.91 ml  Output 395 ml  Net 836.91 ml      11/23/2022    5:00 AM 11/22/2022    9:00 PM 09/14/2022    8:53 AM  Last 3 Weights  Weight (lbs) 118 lb 6.2 oz 111 lb 110 lb  Weight (kg) 53.7 kg 50.349 kg 49.896 kg     Body mass index is 23.12 kg/m.  General:  Well nourished, well developed HEENT: normal Neck: no JVD, R IJ site is CDI Vascular: No carotid bruits Cardiac:  RRR; no murmurs, gallops or rubs Lungs: CTA b/, no wheezing, rhonchi or rales, on vent Abd: soft, nontender Ext: no edema Musculoskeletal:  No deformities Skin: warm and dry  Neuro: unable to assess Psych:  unable to assess   EKG:  The EKG was personally reviewed and  demonstrates:    Junctional bradycardia 37bpm, narrow baseline QRS 90ms V paced 70  OLD SR 71bpm, PACs, PVC  Telemetry:  Telemetry was personally reviewed and demonstrates:   VP 70, occ PVCs, couplet  Relevant CV Studies:  Echocardiogram 10/10/2020:  1. Left ventricular ejection fraction, by estimation, is 55 to 60%. The  left ventricle has normal function. The left ventricle has no regional  wall motion abnormalities. There is moderate asymmetric hypertrophy of the  basal septal segment. The rest of  the LV segments demonstrate mild left ventricular hypertrophy. Left  ventricular diastolic parameters are consistent with Grade I diastolic  dysfunction (impaired relaxation). Elevated left atrial pressure.   2. Right ventricular systolic function is normal. The right ventricular  size is mildly enlarged. There is severely elevated pulmonary artery  systolic pressure. The estimated right ventricular systolic pressure is  60.8 mmHg.   3. Left atrial size was severely dilated.   4. Right atrial size was mildly dilated.   5. The mitral valve is abnormal. There are mildly elevated gradients with  flow acceleration noted across the mitral valve with mean gradient  at HR 77 bpm. MVA by continuity 2.7cm2 with no significant mitral stenosis.  Mild mitral valve  regurgitation.   6. The aortic valve is tricuspid. There is mild calcification of the  aortic valve.  There is moderate thickening of the aortic valve. The LCC  appears fixed with trivial AR. Mild to moderate aortic valve  sclerosis/calcification is present, without any  evidence of aortic stenosis.   7. The inferior vena cava is normal in size with greater than 50%  respiratory variability, suggesting right atrial pressure of 3 mmHg.   Comparison(s): Compared to prior TTE in 2016, there is now severe  pulmonary HTN present with PASP (previously ).   Laboratory Data:  High Sensitivity Troponin:   Recent Labs   Lab 11/22/22 1839 11/22/22 2024  TROPONINIHS 19* 20*     Chemistry Recent Labs  Lab 11/22/22 1839 11/22/22 2125 11/22/22 2229 11/23/22 0140  NA 126* 125* 126* 125*  K 5.0 5.1 5.0 4.8  CL 89*  --   --  95*  CO2 21*  --   --  19*  GLUCOSE 90  --   --  153*  BUN 39*  --   --  37*  CREATININE 2.32*  --   --  2.26*  CALCIUM 8.4*  --   --  7.6*  MG 1.9  --   --  1.8  GFRNONAA 22*  --   --  22*  ANIONGAP 16*  --   --  11    Recent Labs  Lab 11/22/22 1839  PROT 5.7*  ALBUMIN 3.3*  AST 114*  ALT 57*  ALKPHOS 66  BILITOT 0.6   Lipids No results for input(s): "CHOL", "TRIG", "HDL", "LABVLDL", "LDLCALC", "CHOLHDL" in the last 168 hours.  Hematology Recent Labs  Lab 11/22/22 1839 11/22/22 2125 11/22/22 2229 11/23/22 0140  WBC 9.8  --   --  10.2  RBC 3.66*  --   --  3.81*  HGB 11.8* 12.2 11.6* 12.1  HCT 33.2* 36.0 34.0* 32.9*  MCV 90.7  --   --  86.4  MCH 32.2  --   --  31.8  MCHC 35.5  --   --  36.8*  RDW 12.5  --   --  12.0  PLT 198  --   --  232   Thyroid  Recent Labs  Lab 11/22/22 1839  TSH 0.955  FREET4 1.51*    BNP Recent Labs  Lab 11/23/22 0137  BNP 2,297.1*    DDimer No results for input(s): "DDIMER" in the last 168 hours.   Radiology/Studies:  DG Chest Port 1 View Result Date: 11/23/2022 CLINICAL DATA:  Temporary transvenous cardiac pacemaker EXAM: PORTABLE CHEST 1 VIEW COMPARISON:  11/22/2022 FINDINGS: Transvenous pacer in place with tip in the right ventricle. Endotracheal tube and NG tube are unchanged. Cardiomegaly. No confluent airspace opacities, effusions or edema. No acute bony abnormality. IMPRESSION: Cardiomegaly.  No active disease. Electronically Signed   By: Charlett Nose M.D.   On: 11/23/2022 03:22   US RENAL Result Date: 11/23/2022 CLINICAL DATA:  [id number] with acute kidney injury. EXAM: RENAL / URINARY TRACT ULTRASOUND COMPLETE COMPARISON:  CT with IV contrast 10/22/2020 FINDINGS: Right Kidney: Renal measurements: 10.1 x 4.1 x 4.4 cm  = volume: 94.6 mL. Echogenicity within normal limits. No mass or hydronephrosis visualized. There is a 1.1 cm nonobstructive caliceal stone again noted in the inferior pole. Left Kidney: Renal measurements: 9.5 x 4.3 x 4.4 cm = volume: 95.3 mL. Echogenicity within normal limits. No mass, stones or hydronephrosis visualized. A 2.3 cm Bosniak 1 simple cyst is again noted arising from the inferior pole of this kidney. No follow-up imaging is recommended. Bladder: Contracted and  obscured by bowel gas. Other: No free fluid. IMPRESSION: 1. No acute findings. No hydronephrosis. No increased cortical echogenicity bilaterally. 2. 1.1 cm nonobstructive caliceal stone in the inferior pole of the right kidney. 3. 2.3 cm Bosniak 1 simple cyst arising from the inferior pole left kidney. 4. Bladder is contracted and obscured by bowel gas. Electronically Signed   By: Almira Bar M.D.   On: 11/23/2022 01:33   DG Chest Portable 1 View Result Date: 11/22/2022 CLINICAL DATA:  Intubated EXAM: PORTABLE CHEST 1 VIEW COMPARISON:  11/22/2022 FINDINGS: Interval intubation, tip of the endotracheal tube is about 4 cm superior to carina. Esophageal tube tip overlies the mid stomach. Cardiomegaly. Aortic atherosclerosis. No consolidation, pleural effusion or pneumothorax IMPRESSION: Endotracheal tube tip about 4 cm superior to carina. Esophageal tube tip overlies the mid stomach, side-port near GE junction. Cardiomegaly. Electronically Signed   By: Jasmine Pang M.D.   On: 11/22/2022 22:10   DG Abdomen 1 View Result Date: 11/22/2022 CLINICAL DATA:  OG tube placement EXAM: ABDOMEN - 1 VIEW COMPARISON:  CT 10/22/2020 FINDINGS: Numerous leads and wires limit the exam. Overall nonobstructed gas pattern. Probable tip of esophageal tube in left upper quadrant but not definitive. Oval calcification in the pelvis. IMPRESSION: 1. Possible tip of esophageal tube in the left upper quadrant but not definitive. 2. Nonobstructed gas pattern.  Electronically Signed   By: Jasmine Pang M.D.   On: 11/22/2022 22:09   DG Chest Portable 1 View Result Date: 11/22/2022 CLINICAL DATA:  Chest discomfort EXAM: PORTABLE CHEST 1 VIEW COMPARISON:  11/22/2022 FINDINGS: Cardiomegaly. Vascular congestion. Interstitial prominence may reflect early interstitial edema. No effusions. No acute bony abnormality. Old healed bilateral rib fractures. IMPRESSION: Cardiomegaly with vascular congestion and possible early interstitial edema. Electronically Signed   By: Charlett Nose M.D.   On: 11/22/2022 21:20   CT Head Wo Contrast Result Date: 11/22/2022 CLINICAL DATA:  Mental status change, unknown cause. Slurred speech. EXAM: CT HEAD WITHOUT CONTRAST TECHNIQUE: Contiguous axial images were obtained from the base of the skull through the vertex without intravenous contrast. RADIATION DOSE REDUCTION: This exam was performed according to the departmental dose-optimization program which includes automated exposure control, adjustment of the mA and/or kV according to patient size and/or use of iterative reconstruction technique. COMPARISON:  Head CT 10/22/2020. FINDINGS: Brain: Within limitations of streak artifact from basilar artery aneurysm coil, no acute hemorrhage, loss of gray-white differentiation, hydrocephalus or extra-axial collection. Vascular: Interval postprocedural changes of stent assisted coil embolization of the basilar artery aneurysm. Skull: No calvarial fracture or suspicious bone lesion. Skull base is unremarkable. Sinuses/Orbits: Unremarkable. Other: None. IMPRESSION: 1. No acute intracranial abnormality. 2. Interval stent assisted coil embolization of a basilar artery aneurysm. Electronically Signed   By: Orvan Falconer M.D.   On: 11/22/2022 19:49   DG Chest Portable 1 View Result Date: 11/22/2022 CLINICAL DATA:  Chest discomfort EXAM: PORTABLE CHEST 1 VIEW COMPARISON:  X-ray 10/25/2020 FINDINGS: Underinflated x-ray rotated to the left with overlapping  defibrillator pads and cardiac leads. Right shoulder arthroplasty seen. Augmentation cement along lower thoracic spine vertebral levels. No consolidation, pneumothorax or effusion. Mild interstitial prominence. Enlarged cardiopericardial silhouette with calcified aorta. No obvious edema. Possible old right-sided rib fractures. Osteopenia IMPRESSION: Underinflated x-ray with chronic changes. No consolidation. Enlarged heart. Electronically Signed   By: Karen Kays M.D.   On: 11/22/2022 18:53      Assessment and Plan:   Junctional bradycardia Home metoprolol held, admitted yesterday late day, presuming she  took yesterday (?) Remains intubated and on low dose pressor, despite good HR TV pacer threshold is <1, did not lose capture Remains at 70/63mA AKI remains  Will watch her clinical course, as she improved hemodynamically/and if no recovery of conduction, would consider PPM. Her TTE is still pending  EP MD will see  Contiue as per CCM and and attending cardiology team   Risk Assessment/Risk Scores:   For questions or updates, please contact [facility] Please consult [email or web address] for contact info under   Signed, Taylor Pigeon, [name]  11/23/2022 12:19 PM  EP Attending  Patient seen and examined. Agree with the findings as noted above. The patient presents with symptomatic bradycardia on metoprolol. She is a pleasant 72 yo woman with a  h/o cirrhosis, HTN, remote basilar artery aneursym, and PAF on a beta blocker who presented with symptomatic bradycardia.  She has known pulmonary HTN. The patient has undergone TV ppm insertion. She required endotracheal tube insertion. The patient has required levophed. On exam she is a ill appearing and intubated withdrawing to pain. Bp is 95 on levophed and when I turned down her PPM, her sinus rate is in the 50's. Ext are warm. Cannot assess neuro. Lungs with scattered rales. Tele with sinus brady A/P Beta blocker intoxication - I  expect her HR to continue to improve over time. No indication for PPM Hypotension - I suspect that this is due to the beta blocker though cannot exclude a secondary cause like infection. She was surely at risk for aspiration on presentation. Continue supportive care.   Taylor Gowda Giorgio Chabot,MD

## 2022-11-23 NOTE — Progress Notes (Addendum)
Pharmacy Antibiotic Note  [name] is a 72 y.o. female admitted on 11/22/2022 with sepsis.  Pharmacy has been consulted for vancomycin and Zosyn dosing.  Patient presented with bradycardia and hypotension requiring transvenous pacing.. Concern for sepsis in addition to bradycardia and she is noted s/p transvenous pacing wire  -WBC= 10.2, afeb, SCr= 2.26 (BL ~ 0.8)  -vancomycin 1000mg  given ~ 1am 5/31  Plan: -Continue Zosyn 2.25gm IV q8h -Vancomycin 750mg  IV q48hr (estimated AUC= 536), will give next dose 1/1 at 11pm -Will follow renal function, cultures and clinical progress   Height: 5' (152.4 cm) Weight: 53.7 kg (118 lb 6.2 oz) IBW/kg (Calculated) : 45.5  Temp (24hrs), Avg:97.7 F (36.5 C), Min:96.7 F (35.9 C), Max:98.8 F (37.1 C)  Recent Labs  Lab 11/22/22 1839 11/23/22 0137 11/23/22 0140  WBC 9.8  --  10.2  CREATININE 2.32*  --  2.26*  LATICACIDVEN  --  1.1  --      Estimated Creatinine Clearance: 15.9 mL/min (A) (by C-G formula based on SCr of 2.26 mg/dL (H)).    No Known Allergies  Antimicrobials this admission: Vancomycin 5/30 >>   Zosyn 5/30 >>    Microbiology results: 5/30 BCx: IP 5/30 MRSA PCR: neg  Thank you for involving pharmacy in this patient's care.  Harland German, PharmD Clinical Pharmacist **Pharmacist phone directory can now be found on [email or web address] ([email or web address]).  Listed under [facility].

## 2022-11-23 NOTE — Progress Notes (Signed)
eLink Physician-Brief Progress Note Patient Name: [name] DOB: 20-Apr-1951 MRN: [id number]   Date of Service  11/23/2022  HPI/Events of Note  Notified of order for saline at 50 mL/h.  RN wants to know whether to continue this given patient's oliguric AKI.  eICU Interventions  Will continue orders from admitting MD.     Intervention Category Evaluation Type: Other  Carilyn Goodpasture 11/23/2022, 4:32 AM

## 2022-11-23 NOTE — Progress Notes (Signed)
NAME:  [name], MRN:  [id number], DOB:  Jun 22, 1951, LOS: 1 ADMISSION DATE:  11/22/2022, CONSULTATION DATE:  11/22/22 REFERRING MD:  EDP, CHIEF COMPLAINT:  acute hypoxic resp failure, symptomatic bradycardia   History of Present Illness:  72 yo female with pmh htn, hypothyroidism, cirrhosis, portal hypertension, paf not on chronic a/c, basilar artery aneurysm s/p coil presented today with encephalopathy, hypotension with bradycardia req cutaneous pacing to maintain BP. Pt was reportedly in her normal state of health until this afternoon when her daughter found her disoriented and diaphoretic. EMS was called and she was noted to be bradycardic and hypotensive. She was started on transcutaneous pacing.   Upon arrival to ed she was reportedly relatively stable while pacing but would have periods of loc, she underwent cth without acute abnormality found, labs revealed hyponatremia and aki with Cr (baseline 0.8) up to 2.32, hypocalcemia and elevated lft's. No clear indication of infection, no leukocytosis, afebrile, cxr clear.   Per report from EDP, cardiology was consulted and are seeing for TVP placement when pt became suddenly unresponsive req emergency ett, as such ccm has been asked to admit pt as cardiology does not feel this is cardiac in etiology.   Pt is currently intubated and sedation on vent and heading to cath lab for TVP placement.   Pertinent  Medical History  As above  Significant Hospital Events: Including procedures, antibiotic start and stop dates in addition to other pertinent events   Admitted to [facility] 11/22/22 Intubated, tvp placed 11/22/22  Interim History / Subjective:   Critically ill intubated on mechanical life support  Objective   Blood pressure 100/64, pulse 70, temperature 99.1 F (37.3 C), resp. rate 20, height 5' (1.524 m), weight 53.7 kg, SpO2 100 %.    Vent Mode: PRVC FiO2 (%):  [40 %-100 %] 40 % Set Rate:  [20 bmp] 20 bmp Vt Set:  [380 mL] 380  mL PEEP:  [5 cmH20] 5 cmH20 Plateau Pressure:  [16 cmH20-20 cmH20] 17 cmH20   Intake/Output Summary (Last 24 hours) at 11/23/2022 0935 Last data filed at 11/23/2022 0600 Gross per 24 hour  Intake 713.55 ml  Output 275 ml  Net 438.55 ml   Filed Weights   11/22/22 2100 11/23/22 0500  Weight: 50.3 kg 53.7 kg    Examination: General: Sedated on mechanical vent support HENT: NCAT, endotracheal tube in place Lungs: Clear to auscultation bilaterally, bilateral ventilated breath sounds Cardiovascular: Regular rate rhythm, S1-S2, TVP in place Abdomen: Soft, nontender nondistended Extremities: No significant edema Neuro: Sedated, unresponsive GU: deferred  Resolved Hospital Problem list     Assessment & Plan:   Symptomatic bradycardia:  Paroxysmal atrial fibrillation at baseline (not on chronic a/c) H/o hypertension Plan: TVP has been placed, appreciate management by cardiology Remains on norepinephrine to maintain mean arterial pressure greater than 65 Continue to follow electrolytes Echo pending Allowing home beta-blocker to continue to be held and washout Hopefully her sinus node dysfunction will improve.  Acute encephalopathy:  -Continue antibiotics for the time being, continue observe for any other sign of infection - If cultures are negative can consider de-escalation or discontinuation - If encephalopathy does not improve may need consideration for MRI brain imaging or EEG.  Aki Transaminitis:  Plan: Cirrhosis at baseline Continue supportive care Strict I's and O's, follow urine output, avoid nephrotoxic agents  Hypothyroidism:  -cont continue levothyroxine  Hyponatremia Plan: Supportive care, multifactorial etiology   Best Practice (right click and "Reselect all SmartList Selections" daily)  Diet/type: NPO DVT prophylaxis: prophylactic heparin  GI prophylaxis: H2B Lines: Central line Foley:  Yes, and it is still needed Code Status:  full code Last  date of multidisciplinary goals of care discussion [I will attempt to call patient's family today]  Labs   CBC: Recent Labs  Lab 11/22/22 1839 11/22/22 2125 11/22/22 2229 11/23/22 0140  WBC 9.8  --   --  10.2  NEUTROABS 8.3*  --   --   --   HGB 11.8* 12.2 11.6* 12.1  HCT 33.2* 36.0 34.0* 32.9*  MCV 90.7  --   --  86.4  PLT 198  --   --  232    Basic Metabolic Panel: Recent Labs  Lab 11/22/22 1839 11/22/22 2125 11/22/22 2229 11/23/22 0140  NA 126* 125* 126* 125*  K 5.0 5.1 5.0 4.8  CL 89*  --   --  95*  CO2 21*  --   --  19*  GLUCOSE 90  --   --  153*  BUN 39*  --   --  37*  CREATININE 2.32*  --   --  2.26*  CALCIUM 8.4*  --   --  7.6*  MG 1.9  --   --  1.8   GFR: Estimated Creatinine Clearance: 15.9 mL/min (A) (by C-G formula based on SCr of 2.26 mg/dL (H)). Recent Labs  Lab 11/22/22 1839 11/23/22 0137 11/23/22 0140  WBC 9.8  --  10.2  LATICACIDVEN  --  1.1  --     Liver Function Tests: Recent Labs  Lab 11/22/22 1839  AST 114*  ALT 57*  ALKPHOS 66  BILITOT 0.6  PROT 5.7*  ALBUMIN 3.3*   No results for input(s): "LIPASE", "AMYLASE" in the last 168 hours. Recent Labs  Lab 11/22/22 1839  AMMONIA 35    ABG    Component Value Date/Time   PHART 7.270 (L) 11/22/2022 2229   PCO2ART 39.1 11/22/2022 2229   PO2ART 193 (H) 11/22/2022 2229   HCO3 18.1 (L) 11/22/2022 2229   TCO2 19 (L) 11/22/2022 2229   ACIDBASEDEF 8.0 (H) 11/22/2022 2229   O2SAT 100 11/22/2022 2229     Coagulation Profile: Recent Labs  Lab 11/23/22 0137  INR 1.1    Cardiac Enzymes: No results for input(s): "CKTOTAL", "CKMB", "CKMBINDEX", "TROPONINI" in the last 168 hours.  HbA1C: Hgb A1c MFr Bld  Date/Time Value Ref Range Status  10/12/2020 03:01 PM 4.7 (L) 4.8 - 5.6 % Final    Comment:    REPEATED TO VERIFY (NOTE) Pre diabetes:          5.7%-6.4%  Diabetes:              >6.4%  Glycemic control for   <7.0% adults with diabetes     CBG: Recent Labs  Lab  11/23/22 0007 11/23/22 0451 11/23/22 0847  GLUCAP 133* 129* 124*    Review of Systems:   Unobtainable 2/2 pt intubated sedated status  Past Medical History:  She,  has a past medical history of ACROCHORDON (03/16/2010), Arthritis, Atrial fibrillation (HCC), Chronic kidney disease, COPD (chronic obstructive pulmonary disease) (HCC), GI bleeding (06/2018), Heart murmur, Hypertension, Hypothyroidism, Liver cirrhosis (HCC), Multiple duodenal ulcers (07/16/2018), and Thyroid disease.   Surgical History:   Past Surgical History:  Procedure Laterality Date   BIOPSY  07/16/2018   Procedure: BIOPSY;  Surgeon: Napoleon Form, MD;  Location: [facility]COPY;  Service: Endoscopy;;   BREAST EXCISIONAL BIOPSY Left    benign  CHOLECYSTECTOMY N/A 12/07/2015   Procedure: LAPAROSCOPIC CHOLECYSTECTOMY WITH INTRAOPERATIVE CHOLANGIOGRAM;  Surgeon: Ovidio Kin, MD;  Location: [facility] ORS;  Service: General;  Laterality: N/A;   COLONOSCOPY     ERCP N/A 12/06/2015   Procedure: ENDOSCOPIC RETROGRADE CHOLANGIOPANCREATOGRAPHY (ERCP);  Surgeon: Rachael Fee, MD;  Location: [facility];  Service: Endoscopy;  Laterality: N/A;   ESOPHAGOGASTRODUODENOSCOPY (EGD) WITH PROPOFOL N/A 07/16/2018   Procedure: ESOPHAGOGASTRODUODENOSCOPY (EGD) WITH PROPOFOL;  Surgeon: Napoleon Form, MD;  Location: [facility];  Service: Endoscopy;  Laterality: N/A;   IM NAILING TIBIA Right 07/19/2012   DR Despina Hick   IR ANGIO INTRA EXTRACRAN SEL COM CAROTID INNOMINATE BILAT MOD SED  12/12/2021   IR ANGIO VERTEBRAL SEL VERTEBRAL UNI L MOD SED  12/12/2021   IR ANGIO VERTEBRAL SEL VERTEBRAL UNI L MOD SED  01/09/2022   IR ANGIO VERTEBRAL SEL VERTEBRAL UNI R MOD SED  09/14/2022   IR ANGIOGRAM FOLLOW UP STUDY  01/09/2022   IR ANGIOGRAM FOLLOW UP STUDY  01/09/2022   IR ANGIOGRAM FOLLOW UP STUDY  01/09/2022   IR ANGIOGRAM FOLLOW UP STUDY  01/09/2022   IR ANGIOGRAM FOLLOW UP STUDY  01/09/2022   IR ANGIOGRAM FOLLOW UP STUDY  01/09/2022   IR  ANGIOGRAM FOLLOW UP STUDY  01/09/2022   IR ANGIOGRAM FOLLOW UP STUDY  01/09/2022   IR ANGIOGRAM FOLLOW UP STUDY  01/09/2022   IR NEURO EACH ADD'L AFTER BASIC UNI LEFT (MS)  01/09/2022   IR TRANSCATH/EMBOLIZ  01/09/2022   IR US GUIDE VASC ACCESS RIGHT  09/14/2022   IR VERTEBROPLASTY CERV/THOR BX INC UNI/BIL INC/INJECT/IMAGING  10/17/2020   IR VERTEBROPLASTY EA ADDL (T&LS) BX INC UNI/BIL INC INJECT/IMAGING  10/17/2020   RADIOLOGY WITH ANESTHESIA N/A 01/09/2022   Procedure: Aneurysm embolization;  Surgeon: Lisbeth Renshaw, MD;  Location[facility];  Service: Radiology;  Laterality: N/A;   SHOULDER HEMI-ARTHROPLASTY Right 07/22/2013   Procedure: RIGHT SHOULDER HEMI-ARTHROPLASTY;  Surgeon: Verlee Rossetti, MD;  Location[facility];  Service: Orthopedics;  Laterality: Right;   TEMPORARY PACEMAKER N/A 11/22/2022   Procedure: TEMPORARY PACEMAKER;  Surgeon: Elder Negus, MD;  Location: [facility];  Service: Cardiovascular;  Laterality: N/A;   TIBIA IM NAIL INSERTION Right 07/19/2013   Procedure: INTRAMEDULLARY (IM) NAIL TIBIAL;  Surgeon: Loanne Drilling, MD;  Location: [facility] OR;  Service: Orthopedics;  Laterality: Right;   TUBAL LIGATION     UPPER GASTROINTESTINAL ENDOSCOPY       Social History:   reports that she has been smoking cigarettes. She has a 40.00 pack-year smoking history. She has never used smokeless tobacco. She reports that she does not currently use alcohol. She reports that she does not use drugs.   Family History:  Her family history includes Colon polyps in her brother; Healthy in her daughter and daughter; Heart disease in her brother, brother, and mother; Lung cancer in her father; Psoriasis in her brother. There is no history of Colon cancer, Esophageal cancer, Rectal cancer, or Stomach cancer.   Allergies No Known Allergies   Home Medications  Prior to Admission medications   Medication Sig Start Date End Date Taking? Authorizing Provider  albuterol (VENTOLIN HFA) 108  (90 Base) MCG/ACT inhaler Inhale 2 puffs into the lungs every 4 (four) hours as needed for wheezing or shortness of breath. 06/15/21   [provider]  aspirin EC 81 MG tablet Take 81 mg by mouth daily. Swallow whole.    [provider]  B Complex Vitamins (B  COMPLEX 1 PO) Take 1 tablet by mouth daily.    [provider]  BRILINTA 90 MG TABS tablet Take 90 mg by mouth 2 (two) times daily. 12/25/21   [provider]  calcium carbonate (OSCAL) 1500 (600 Ca) MG TABS tablet Take 600 mg of elemental calcium by mouth daily with breakfast.    [provider]  citalopram (CELEXA) 20 MG tablet TAKE 1 TABLET BY MOUTH EVERY DAY. Patient taking differently: Take 20 mg by mouth daily. 03/17/14   Tyrone Nine, MD  diclofenac Sodium (VOLTAREN) 1 % GEL Apply 2 g topically 4 (four) times daily as needed (pain). 10/18/20   Zigmund Daniel., MD  folic acid (FOLVITE) 800 MCG tablet Take 800 mcg by mouth daily.    [provider]  gabapentin (NEURONTIN) 100 MG capsule Take 200 mg by mouth 2 (two) times daily. 02/21/21   [provider]  hydrALAZINE (APRESOLINE) 25 MG tablet Take 3 tablets (75 mg total) by mouth 3 (three) times daily. 10/18/20 08/24/23  Zigmund Daniel., MD  ipratropium-albuterol (DUONEB) 0.5-2.5 (3) MG/3ML SOLN Take 3 mLs by nebulization every 6 (six) hours as needed. 10/27/20   Burnadette Pop, MD  levothyroxine (SYNTHROID) 125 MCG tablet Take 125 mcg by mouth daily before breakfast. 08/18/21   [provider]  lisinopril (ZESTRIL) 40 MG tablet Take 40 mg by mouth daily. 09/26/20   [provider]  metoprolol succinate (TOPROL-XL) 50 MG 24 hr tablet TAKE 1 TABLET BY MOUTH ONCE DAILY WITH MEAL 01/25/22   Tolia, Sunit, DO  Multiple Vitamin (MULTIVITAMIN WITH MINERALS) TABS tablet Take 1 tablet by mouth daily.    [provider]  pantoprazole (PROTONIX) 40 MG tablet Take 1 tablet (40 mg total) by mouth at bedtime.  07/06/21 09/01/23  Meryl Dare, MD     This patient is critically ill with multiple organ system failure; which, requires frequent high complexity decision making, assessment, support, evaluation, and titration of therapies. This was completed through the application of advanced monitoring technologies and extensive interpretation of multiple databases. During this encounter critical care time was devoted to patient care services described in this note for 32 minutes.  Josephine Igo, DO [facility] 11/23/2022 9:36 AM

## 2022-11-24 ENCOUNTER — Inpatient Hospital Stay (HOSPITAL_COMMUNITY): Payer: Medicare HMO

## 2022-11-24 DIAGNOSIS — R001 Bradycardia, unspecified: Secondary | ICD-10-CM | POA: Diagnosis not present

## 2022-11-24 LAB — GLUCOSE, CAPILLARY
Glucose-Capillary: 103 mg/dL — ABNORMAL HIGH (ref 70–99)
Glucose-Capillary: 114 mg/dL — ABNORMAL HIGH (ref 70–99)
Glucose-Capillary: 116 mg/dL — ABNORMAL HIGH (ref 70–99)
Glucose-Capillary: 90 mg/dL (ref 70–99)
Glucose-Capillary: 96 mg/dL (ref 70–99)
Glucose-Capillary: 98 mg/dL (ref 70–99)

## 2022-11-24 LAB — BASIC METABOLIC PANEL
Anion gap: 10 (ref 5–15)
BUN: 36 mg/dL — ABNORMAL HIGH (ref 8–23)
CO2: 18 mmol/L — ABNORMAL LOW (ref 22–32)
Calcium: 6.9 mg/dL — ABNORMAL LOW (ref 8.9–10.3)
Chloride: 98 mmol/L (ref 98–111)
Creatinine, Ser: 1.51 mg/dL — ABNORMAL HIGH (ref 0.44–1.00)
GFR, Estimated: 36 mL/min — ABNORMAL LOW (ref >60)
Glucose, Bld: 113 mg/dL — ABNORMAL HIGH (ref 70–99)
Potassium: 3.9 mmol/L (ref 3.5–5.1)
Sodium: 126 mmol/L — ABNORMAL LOW (ref 135–145)

## 2022-11-24 LAB — CBC WITH DIFFERENTIAL/PLATELET
Abs Immature Granulocytes: 0.04 10*3/uL (ref 0.00–0.07)
Basophils Absolute: 0 10*3/uL (ref 0.0–0.1)
Basophils Relative: 0 %
Eosinophils Absolute: 0.1 10*3/uL (ref 0.0–0.5)
Eosinophils Relative: 1 %
HCT: 29.7 % — ABNORMAL LOW (ref 36.0–46.0)
Hemoglobin: 11.1 g/dL — ABNORMAL LOW (ref 12.0–15.0)
Immature Granulocytes: 0 %
Lymphocytes Relative: 13 %
Lymphs Abs: 1.4 10*3/uL (ref 0.7–4.0)
MCH: 32.7 pg (ref 26.0–34.0)
MCHC: 37.4 g/dL — ABNORMAL HIGH (ref 30.0–36.0)
MCV: 87.6 fL (ref 80.0–100.0)
Monocytes Absolute: 0.8 10*3/uL (ref 0.1–1.0)
Monocytes Relative: 7 %
Neutro Abs: 8.5 10*3/uL — ABNORMAL HIGH (ref 1.7–7.7)
Neutrophils Relative %: 79 %
Platelets: 191 10*3/uL (ref 150–400)
RBC: 3.39 MIL/uL — ABNORMAL LOW (ref 3.87–5.11)
RDW: 12 % (ref 11.5–15.5)
WBC: 10.8 10*3/uL — ABNORMAL HIGH (ref 4.0–10.5)
nRBC: 0 % (ref 0.0–0.2)

## 2022-11-24 LAB — POTASSIUM: Potassium: 3.4 mmol/L — ABNORMAL LOW (ref 3.5–5.1)

## 2022-11-24 LAB — MAGNESIUM: Magnesium: 1.8 mg/dL (ref 1.7–2.4)

## 2022-11-24 LAB — HEMOGLOBIN A1C
Hgb A1c MFr Bld: 4.9 % (ref 4.8–5.6)
Mean Plasma Glucose: 94 mg/dL

## 2022-11-24 MED ORDER — FAMOTIDINE 20 MG PO TABS
20.0000 mg | ORAL_TABLET | Freq: Every day | ORAL | Status: DC
  Filled 2022-11-24: qty 1

## 2022-11-24 MED ORDER — PIPERACILLIN-TAZOBACTAM 3.375 G IVPB
3.3750 g | Freq: Three times a day (TID) | INTRAVENOUS | Status: DC
  Administered 2022-11-24 – 2022-11-26 (×6): 3.375 g via INTRAVENOUS
  Filled 2022-11-24 (×7): qty 50

## 2022-11-24 MED ORDER — FAMOTIDINE 20 MG PO TABS
20.0000 mg | ORAL_TABLET | Freq: Every day | ORAL | Status: DC
  Administered 2022-11-24 – 2022-11-27 (×4): 20 mg via ORAL
  Filled 2022-11-24 (×3): qty 1

## 2022-11-24 MED ORDER — SENNA 8.6 MG PO TABS
1.0000 | ORAL_TABLET | Freq: Two times a day (BID) | ORAL | Status: DC
  Administered 2022-11-24: 8.6 mg via ORAL
  Filled 2022-11-24 (×4): qty 1

## 2022-11-24 MED ORDER — LEVOTHYROXINE SODIUM 112 MCG PO TABS
112.0000 ug | ORAL_TABLET | Freq: Every day | ORAL | Status: DC
  Administered 2022-11-25 – 2022-11-27 (×3): 112 ug via ORAL
  Filled 2022-11-24 (×3): qty 1

## 2022-11-24 MED ORDER — POLYETHYLENE GLYCOL 3350 17 G PO PACK
17.0000 g | PACK | Freq: Every day | ORAL | Status: DC
  Administered 2022-11-24: 17 g via ORAL

## 2022-11-24 MED ORDER — ORAL CARE MOUTH RINSE
15.0000 mL | OROMUCOSAL | Status: DC
  Administered 2022-11-24 – 2022-11-25 (×2): 15 mL via OROMUCOSAL

## 2022-11-24 MED ORDER — CALCIUM GLUCONATE-NACL 1-0.675 GM/50ML-% IV SOLN
1.0000 g | Freq: Once | INTRAVENOUS | Status: AC
  Administered 2022-11-24: 1000 mg via INTRAVENOUS
  Filled 2022-11-24: qty 50

## 2022-11-24 MED ORDER — ORAL CARE MOUTH RINSE: 15.0000 mL | OROMUCOSAL | Status: DC | PRN

## 2022-11-24 MED ORDER — POLYETHYLENE GLYCOL 3350 17 G PO PACK
17.0000 g | PACK | Freq: Every day | ORAL | Status: DC
  Filled 2022-11-24: qty 1

## 2022-11-24 NOTE — Procedures (Signed)
Extubation Procedure Note  Patient Details:   Name: [name] DOB: 1950-12-22 MRN: [id number]   Airway Documentation:    Vent end date: 11/24/22 Vent end time: 0816   Evaluation  O2 sats: stable throughout Complications: No apparent complications Patient did tolerate procedure well. Bilateral Breath Sounds: Clear, Diminished   Patient extubated per MD order & placed on 4L [place]. Patient able to speak/cough post extubation. Patient currently in no distress.  Jacqulynn Cadet 11/24/2022, 8:20 AM

## 2022-11-24 NOTE — Progress Notes (Signed)
Subjective:  Patient seen and examined at bedside, she is extubated. Pacer set at 40 bpm but patient maintaining sinus rhythm at a rate of 80 bpm.  Intake/Output from previous day:  I/O last 3 completed shifts: In: 3084.5 [I.V.:2534.6; IV Piggyback:549.9] Out: 1310 [Urine:1110; Emesis/NG output:200] Total I/O In: 155.1 [I.V.:155.1] Out: 660 [Urine:635; Emesis/NG output:25] Net IO Since Admission: 1,269.6 mL [11/24/22 1426]  Blood pressure 118/68, pulse 80, temperature 98.6 F (37 C), resp. rate (!) 30, height 5' (1.524 m), weight 55.4 kg, SpO2 95 %. Physical Exam Vitals reviewed.  HENT:     Head: Normocephalic and atraumatic.  Cardiovascular:     Rate and Rhythm: Normal rate and regular rhythm.     Heart sounds: Murmur heard.  Pulmonary:     Effort: Pulmonary effort is normal.  Abdominal:     General: Bowel sounds are normal.  Musculoskeletal:     Right lower leg: No edema.     Left lower leg: No edema.  Skin:    General: Skin is warm and dry.  Neurological:     Mental Status: She is alert.     Lab Results: Lab Results  Component Value Date   NA 126 (L) 11/24/2022   K 3.9 11/24/2022   CO2 18 (L) 11/24/2022   GLUCOSE 113 (H) 11/24/2022   BUN 36 (H) 11/24/2022   CREATININE 1.51 (H) 11/24/2022   CALCIUM 6.9 (L) 11/24/2022   GFRNONAA 36 (L) 11/24/2022    BNP (last 3 results) Recent Labs    11/23/22 0137  BNP 2,297.1*    ProBNP (last 3 results) No results for input(s): "PROBNP" in the last 8760 hours.    Latest Ref Rng & Units 11/24/2022    1:55 AM 11/23/2022    1:40 AM 11/22/2022   10:29 PM  BMP  Glucose 70 - 99 mg/dL 161  096    BUN 8 - 23 mg/dL 36  37    Creatinine 0.45 - 1.00 mg/dL 4.09  8.11    Sodium 914 - 145 mmol/L 126  125  126   Potassium 3.5 - 5.1 mmol/L 3.9  4.8  5.0   Chloride 98 - 111 mmol/L 98  95    CO2 22 - 32 mmol/L 18  19    Calcium 8.9 - 10.3 mg/dL 6.9  7.6        Latest Ref Rng & Units 11/22/2022    6:39 PM 01/05/2022    2:29 PM  10/23/2020    5:10 AM  Hepatic Function  Total Protein 6.5 - 8.1 g/dL 5.7  6.7  5.8   Albumin 3.5 - 5.0 g/dL 3.3  4.2  3.3   AST 15 - 41 U/L 114  18  23   ALT 0 - 44 U/L 57  13  18   Alk Phosphatase 38 - 126 U/L 66  65  76   Total Bilirubin 0.3 - 1.2 mg/dL 0.6  2.0  0.9   Bilirubin, Direct 0.0 - 0.2 mg/dL 0.2         Latest Ref Rng & Units 11/24/2022    1:55 AM 11/23/2022    1:40 AM 11/22/2022   10:29 PM  CBC  WBC 4.0 - 10.5 K/uL 10.8  10.2    Hemoglobin 12.0 - 15.0 g/dL 78.2  95.6  21.3   Hematocrit 36.0 - 46.0 % 29.7  32.9  34.0   Platelets 150 - 400 K/uL 191  232     Lipid Panel  Component Value Date/Time   CHOL 139 07/09/2018 1114   TRIG 75 07/09/2018 1114   HDL 58 07/09/2018 1114   CHOLHDL 2.4 07/09/2018 1114   VLDL 26 12/06/2015 0426   LDLCALC 65 07/09/2018 1114   Cardiac Panel (last 3 results) No results for input(s): "CKTOTAL", "CKMB", "TROPONINI", "RELINDX" in the last 72 hours.  HEMOGLOBIN A1C Lab Results  Component Value Date   HGBA1C 4.9 11/23/2022   MPG 94 11/23/2022   TSH Recent Labs    11/22/22 1839  TSH 0.955   Imaging: Imaging results have been reviewed and DG CHEST PORT 1 VIEW  Result Date: 11/24/2022 CLINICAL DATA:  Central line placement EXAM: PORTABLE CHEST 1 VIEW COMPARISON:  11/23/2022 and older studies. FINDINGS: Right internal jugular single lead pacemaker, electrode projecting in the right ventricle, stable from the previous day's exam. No visualized central line. Endotracheal tube and nasal/orogastric tube have been removed. Hazy opacity at the right lung base has increased from the previous day's exam. Mild left lung base opacity is stable. Remainder of the lungs is clear. Small effusion suspected. No pneumothorax. IMPRESSION: 1. No visualized central line. 2. Status post removal of the endotracheal tube and nasogastric tube. 3. Increased opacity at the right lung base since the previous day's exam suspected to be a combination of atelectasis  and pleural fluid. Electronically Signed   By: Amie Portland M.D.   On: 11/24/2022 13:59   ECHOCARDIOGRAM COMPLETE  Result Date: 11/23/2022    ECHOCARDIOGRAM REPORT   Patient Name:   [name] Date of Exam: 11/23/2022 Medical Rec #:  [id number]         Height:       60.0 in Accession #:    [id number]        Weight:       118.4 lb Date of Birth:  1950/11/26         BSA:          1.494 m Patient Age:    71 years          BP:           96/65 mmHg Patient Gender: F                 HR:           71 bpm. Exam Location:  Inpatient Procedure: 2D Echo, Cardiac Doppler and Color Doppler Indications:    Abnormal ECG  History:        Patient has prior history of Echocardiogram examinations, most                 recent 10/10/2020. COPD, Arrythmias:Atrial Fibrillation,                 Signs/Symptoms:Murmur; Risk Factors:Hypertension. CKD.  Sonographer:    Milda Smart Referring Phys: [id number] JESSICA MARSHALL  Sonographer Comments: Image acquisition challenging due to respiratory motion. Patient supine and on a ventilator at time of exam. IMPRESSIONS  1. Intracavitary gradient. Peak velocity 1.23 m/s. Peak gradient 6 mmHg. Left ventricular ejection fraction, by estimation, is 65 to 70%. The left ventricle has normal function. The left ventricle has no regional wall motion abnormalities. There is mild  concentric left ventricular hypertrophy. Left ventricular diastolic parameters are indeterminate. Elevated left ventricular end-diastolic pressure.  2. Right ventricular systolic function is normal. The right ventricular size is normal. There is normal pulmonary artery systolic pressure.  3. Left atrial size was severely dilated.  4. Right atrial size was  severely dilated.  5. The mitral valve is degenerative. Mild mitral valve regurgitation. No evidence of mitral stenosis.  6. Tricuspid valve regurgitation is moderate to severe.  7. The aortic valve is tricuspid. There is moderate calcification of the aortic valve. There  is moderate thickening of the aortic valve. Aortic valve regurgitation is not visualized. Mild aortic valve stenosis. Aortic valve mean gradient measures 11.0 mmHg. Aortic valve Vmax measures 2.19 m/s.  8. The inferior vena cava is normal in size with greater than 50% respiratory variability, suggesting right atrial pressure of 3 mmHg. FINDINGS  Left Ventricle: Intracavitary gradient. Peak velocity 1.23 m/s. Peak gradient 6 mmHg. Left ventricular ejection fraction, by estimation, is 65 to 70%. The left ventricle has normal function. The left ventricle has no regional wall motion abnormalities. The left ventricular internal cavity size was normal in size. There is mild concentric left ventricular hypertrophy. Left ventricular diastolic parameters are indeterminate. Elevated left ventricular end-diastolic pressure. Right Ventricle: The right ventricular size is normal. No increase in right ventricular wall thickness. Right ventricular systolic function is normal. There is normal pulmonary artery systolic pressure. The tricuspid regurgitant velocity is 2.28 m/s, and  with an assumed right atrial pressure of 3 mmHg, the estimated right ventricular systolic pressure is 23.8 mmHg. Left Atrium: Left atrial size was severely dilated. Right Atrium: Right atrial size was severely dilated. Pericardium: There is no evidence of pericardial effusion. Mitral Valve: The mitral valve is degenerative in appearance. Mild mitral valve regurgitation. No evidence of mitral valve stenosis. MV peak gradient, 6.8 mmHg. The mean mitral valve gradient is 2.0 mmHg. Tricuspid Valve: The tricuspid valve is normal in structure. Tricuspid valve regurgitation is moderate to severe. No evidence of tricuspid stenosis. Aortic Valve: The aortic valve is tricuspid. There is moderate calcification of the aortic valve. There is moderate thickening of the aortic valve. Aortic valve regurgitation is not visualized. Mild aortic stenosis is present. Aortic  valve mean gradient measures 11.0 mmHg. Aortic valve peak gradient measures 19.2 mmHg. Pulmonic Valve: The pulmonic valve was normal in structure. Pulmonic valve regurgitation is not visualized. No evidence of pulmonic stenosis. Aorta: The aortic root is normal in size and structure. Venous: The inferior vena cava is normal in size with greater than 50% respiratory variability, suggesting right atrial pressure of 3 mmHg. IAS/Shunts: No atrial level shunt detected by color flow Doppler.  LEFT VENTRICLE PLAX 2D LVIDd:         3.20 cm   Diastology LVIDs:         2.50 cm   LV e' medial:    2.27 cm/s LV PW:         1.10 cm   LV E/e' medial:  46.3 LV IVS:        1.10 cm   LV e' lateral:   5.70 cm/s LVOT diam:     2.00 cm   LV E/e' lateral: 18.4 LVOT Area:     3.14 cm  RIGHT VENTRICLE RV Basal diam:  3.80 cm RV S prime:     9.37 cm/s TAPSE (M-mode): 1.2 cm LEFT ATRIUM              Index        RIGHT ATRIUM           Index LA diam:        6.50 cm  4.35 cm/m   RA Area:     19.60 cm LA Vol (A2C):   107.0 ml 71.63 ml/m  RA Volume:   63.40 ml  42.44 ml/m LA Vol (A4C):   78.8 ml  52.75 ml/m LA Biplane Vol: 96.6 ml  64.67 ml/m  AORTIC VALVE AV Vmax:      219.00 cm/s AV Vmean:     150.000 cm/s AV VTI:       0.351 m AV Peak Grad: 19.2 mmHg AV Mean Grad: 11.0 mmHg  AORTA Ao Root diam: 3.20 cm Ao Asc diam:  3.30 cm MITRAL VALVE                TRICUSPID VALVE MV Area (PHT): 3.19 cm     TR Peak grad:   20.8 mmHg MV Peak grad:  6.8 mmHg     TR Mean grad:   15.0 mmHg MV Mean grad:  2.0 mmHg     TR Vmax:        228.00 cm/s MV Vmax:       1.30 m/s     TR Vmean:       189.0 cm/s MV Vmean:      69.9 cm/s MV Decel Time: 238 msec     SHUNTS MR Peak grad: 97.8 mmHg     Systemic Diam: 2.00 cm MR Mean grad: 62.0 mmHg MR Vmax:      494.50 cm/s MR Vmean:     375.5 cm/s MV E velocity: 105.00 cm/s Chilton Si MD Electronically signed by Chilton Si MD Signature Date/Time: 11/23/2022/4:49:28 PM    Final    DG Chest Port 1  View  Result Date: 11/23/2022 CLINICAL DATA:  Temporary transvenous cardiac pacemaker EXAM: PORTABLE CHEST 1 VIEW COMPARISON:  11/22/2022 FINDINGS: Transvenous pacer in place with tip in the right ventricle. Endotracheal tube and NG tube are unchanged. Cardiomegaly. No confluent airspace opacities, effusions or edema. No acute bony abnormality. IMPRESSION: Cardiomegaly.  No active disease. Electronically Signed   By: Charlett Nose M.D.   On: 11/23/2022 03:22   US RENAL  Result Date: 11/23/2022 CLINICAL DATA:  [id number] with acute kidney injury. EXAM: RENAL / URINARY TRACT ULTRASOUND COMPLETE COMPARISON:  CT with IV contrast 10/22/2020 FINDINGS: Right Kidney: Renal measurements: 10.1 x 4.1 x 4.4 cm = volume: 94.6 mL. Echogenicity within normal limits. No mass or hydronephrosis visualized. There is a 1.1 cm nonobstructive caliceal stone again noted in the inferior pole. Left Kidney: Renal measurements: 9.5 x 4.3 x 4.4 cm = volume: 95.3 mL. Echogenicity within normal limits. No mass, stones or hydronephrosis visualized. A 2.3 cm Bosniak 1 simple cyst is again noted arising from the inferior pole of this kidney. No follow-up imaging is recommended. Bladder: Contracted and obscured by bowel gas. Other: No free fluid. IMPRESSION: 1. No acute findings. No hydronephrosis. No increased cortical echogenicity bilaterally. 2. 1.1 cm nonobstructive caliceal stone in the inferior pole of the right kidney. 3. 2.3 cm Bosniak 1 simple cyst arising from the inferior pole left kidney. 4. Bladder is contracted and obscured by bowel gas. Electronically Signed   By: Almira Bar M.D.   On: 11/23/2022 01:33   CARDIAC CATHETERIZATION  Result Date: 11/23/2022 Images from the original result were not included. 1. Ultrasound guided Rt IJ venous access 2. Temporary transvenous pacemaker placement Elder Negus, MD Pager: [phone] Office: [phone]  DG Chest Portable 1 View  Result Date: 11/22/2022 CLINICAL DATA:   Intubated EXAM: PORTABLE CHEST 1 VIEW COMPARISON:  11/22/2022 FINDINGS: Interval intubation, tip of the endotracheal tube is about 4 cm superior to carina. Esophageal tube tip overlies the mid  stomach. Cardiomegaly. Aortic atherosclerosis. No consolidation, pleural effusion or pneumothorax IMPRESSION: Endotracheal tube tip about 4 cm superior to carina. Esophageal tube tip overlies the mid stomach, side-port near GE junction. Cardiomegaly. Electronically Signed   By: Jasmine Pang M.D.   On: 11/22/2022 22:10   DG Abdomen 1 View  Result Date: 11/22/2022 CLINICAL DATA:  OG tube placement EXAM: ABDOMEN - 1 VIEW COMPARISON:  CT 10/22/2020 FINDINGS: Numerous leads and wires limit the exam. Overall nonobstructed gas pattern. Probable tip of esophageal tube in left upper quadrant but not definitive. Oval calcification in the pelvis. IMPRESSION: 1. Possible tip of esophageal tube in the left upper quadrant but not definitive. 2. Nonobstructed gas pattern. Electronically Signed   By: Jasmine Pang M.D.   On: 11/22/2022 22:09   DG Chest Portable 1 View  Result Date: 11/22/2022 CLINICAL DATA:  Chest discomfort EXAM: PORTABLE CHEST 1 VIEW COMPARISON:  11/22/2022 FINDINGS: Cardiomegaly. Vascular congestion. Interstitial prominence may reflect early interstitial edema. No effusions. No acute bony abnormality. Old healed bilateral rib fractures. IMPRESSION: Cardiomegaly with vascular congestion and possible early interstitial edema. Electronically Signed   By: Charlett Nose M.D.   On: 11/22/2022 21:20   CT Head Wo Contrast  Result Date: 11/22/2022 CLINICAL DATA:  Mental status change, unknown cause. Slurred speech. EXAM: CT HEAD WITHOUT CONTRAST TECHNIQUE: Contiguous axial images were obtained from the base of the skull through the vertex without intravenous contrast. RADIATION DOSE REDUCTION: This exam was performed according to the departmental dose-optimization program which includes automated exposure control,  adjustment of the mA and/or kV according to patient size and/or use of iterative reconstruction technique. COMPARISON:  Head CT 10/22/2020. FINDINGS: Brain: Within limitations of streak artifact from basilar artery aneurysm coil, no acute hemorrhage, loss of gray-white differentiation, hydrocephalus or extra-axial collection. Vascular: Interval postprocedural changes of stent assisted coil embolization of the basilar artery aneurysm. Skull: No calvarial fracture or suspicious bone lesion. Skull base is unremarkable. Sinuses/Orbits: Unremarkable. Other: None. IMPRESSION: 1. No acute intracranial abnormality. 2. Interval stent assisted coil embolization of a basilar artery aneurysm. Electronically Signed   By: Orvan Falconer M.D.   On: 11/22/2022 19:49   DG Chest Portable 1 View  Result Date: 11/22/2022 CLINICAL DATA:  Chest discomfort EXAM: PORTABLE CHEST 1 VIEW COMPARISON:  X-ray 10/25/2020 FINDINGS: Underinflated x-ray rotated to the left with overlapping defibrillator pads and cardiac leads. Right shoulder arthroplasty seen. Augmentation cement along lower thoracic spine vertebral levels. No consolidation, pneumothorax or effusion. Mild interstitial prominence. Enlarged cardiopericardial silhouette with calcified aorta. No obvious edema. Possible old right-sided rib fractures. Osteopenia IMPRESSION: Underinflated x-ray with chronic changes. No consolidation. Enlarged heart. Electronically Signed   By: Karen Kays M.D.   On: 11/22/2022 18:53    Cardiac Studies:  Telemetry 11/23/2022: transvenous packing at rate of 70 bpm   EKG 11/22/2022: Junctional bradycardia 35 bpm   Echocardiogram 10/10/2020:  1. Left ventricular ejection fraction, by estimation, is 55 to 60%. The  left ventricle has normal function. The left ventricle has no regional  wall motion abnormalities. There is moderate asymmetric hypertrophy of the  basal septal segment. The rest of  the LV segments demonstrate mild left ventricular  hypertrophy. Left  ventricular diastolic parameters are consistent with Grade I diastolic  dysfunction (impaired relaxation). Elevated left atrial pressure.   2. Right ventricular systolic function is normal. The right ventricular  size is mildly enlarged. There is severely elevated pulmonary artery  systolic pressure. The estimated right ventricular systolic pressure is  60.8 mmHg.   3. Left atrial size was severely dilated.   4. Right atrial size was mildly dilated.   5. The mitral valve is abnormal. There are mildly elevated gradients with  flow acceleration noted across the mitral valve with mean gradient  at HR 77 bpm. MVA by continuity 2.7cm2 with no significant mitral stenosis.  Mild mitral valve  regurgitation.   6. The aortic valve is tricuspid. There is mild calcification of the  aortic valve. There is moderate thickening of the aortic valve. The LCC  appears fixed with trivial AR. Mild to moderate aortic valve  sclerosis/calcification is present, without any  evidence of aortic stenosis.   7. The inferior vena cava is normal in size with greater than 50%  respiratory variability, suggesting right atrial pressure of 3 mmHg.    Recent Results (from the past 16109 hour(s))  ECHOCARDIOGRAM COMPLETE   Collection Time: 11/23/22  3:15 PM  Result Value   Weight 1,894.19   Height 60   BP 86/63   S' Lateral 2.50   AV Mean grad 11.0   AV Peak grad 19.2   Ao pk vel 2.19   Area-P 1/2 3.19   MV M vel 4.95   MV Peak grad 97.8   Est EF 65 - 70%   Narrative      ECHOCARDIOGRAM REPORT       Patient Name:   [name] Files Date of Exam: 11/23/2022 Medical Rec #:  [id number]         Height:       60.0 in Accession #:    [id number]        Weight:       118.4 lb Date of Birth:  [birth year]         BSA:          1.494 m Patient Age:    71 years          BP:           96/65 mmHg Patient Gender: F                 HR:           71 bpm. Exam Location:  Inpatient  Procedure: 2D  Echo, Cardiac Doppler and Color Doppler  Indications:    Abnormal ECG   History:        Patient has prior history of Echocardiogram examinations, most                 recent 10/10/2020. COPD, Arrythmias:Atrial Fibrillation,                 Signs/Symptoms:Murmur; Risk Factors:Hypertension. CKD.   Sonographer:    Milda Smart Referring Phys: [id number] JESSICA MARSHALL    Sonographer Comments: Image acquisition challenging due to respiratory motion. Patient supine and on a ventilator at time of exam. IMPRESSIONS    1. Intracavitary gradient. Peak velocity 1.23 m/s. Peak gradient 6 mmHg. Left ventricular ejection fraction, by estimation, is 65 to 70%. The left ventricle has normal function. The left ventricle has no regional wall motion abnormalities. There is mild  concentric left ventricular hypertrophy. Left ventricular diastolic parameters are indeterminate. Elevated left ventricular end-diastolic pressure.  2. Right ventricular systolic function is normal. The right ventricular size is normal. There is normal pulmonary artery systolic pressure.  3. Left atrial size was severely dilated.  4. Right atrial size was severely dilated.  5. The mitral valve is degenerative. Mild mitral  valve regurgitation. No evidence of mitral stenosis.  6. Tricuspid valve regurgitation is moderate to severe.  7. The aortic valve is tricuspid. There is moderate calcification of the aortic valve. There is moderate thickening of the aortic valve. Aortic valve regurgitation is not visualized. Mild aortic valve stenosis. Aortic valve mean gradient measures 11.0  mmHg. Aortic valve Vmax measures 2.19 m/s.  8. The inferior vena cava is normal in size with greater than 50% respiratory variability, suggesting right atrial pressure of 3 mmHg.  FINDINGS  Left Ventricle: Intracavitary gradient. Peak velocity 1.23 m/s. Peak gradient 6 mmHg. Left ventricular ejection fraction, by estimation, is 65 to 70%. The left  ventricle has normal function. The left ventricle has no regional wall motion abnormalities.  The left ventricular internal cavity size was normal in size. There is mild concentric left ventricular hypertrophy. Left ventricular diastolic parameters are indeterminate. Elevated left ventricular end-diastolic pressure.  Right Ventricle: The right ventricular size is normal. No increase in right ventricular wall thickness. Right ventricular systolic function is normal. There is normal pulmonary artery systolic pressure. The tricuspid regurgitant velocity is 2.28 m/s, and  with an assumed right atrial pressure of 3 mmHg, the estimated right ventricular systolic pressure is 23.8 mmHg.  Left Atrium: Left atrial size was severely dilated.  Right Atrium: Right atrial size was severely dilated.  Pericardium: There is no evidence of pericardial effusion.  Mitral Valve: The mitral valve is degenerative in appearance. Mild mitral valve regurgitation. No evidence of mitral valve stenosis. MV peak gradient, 6.8 mmHg. The mean mitral valve gradient is 2.0 mmHg.  Tricuspid Valve: The tricuspid valve is normal in structure. Tricuspid valve regurgitation is moderate to severe. No evidence of tricuspid stenosis.  Aortic Valve: The aortic valve is tricuspid. There is moderate calcification of the aortic valve. There is moderate thickening of the aortic valve. Aortic valve regurgitation is not visualized. Mild aortic stenosis is present. Aortic valve mean gradient  measures 11.0 mmHg. Aortic valve peak gradient measures 19.2 mmHg.  Pulmonic Valve: The pulmonic valve was normal in structure. Pulmonic valve regurgitation is not visualized. No evidence of pulmonic stenosis.  Aorta: The aortic root is normal in size and structure.  Venous: The inferior vena cava is normal in size with greater than 50% respiratory variability, suggesting right atrial pressure of 3 mmHg.  IAS/Shunts: No atrial level shunt detected by  color flow Doppler.    LEFT VENTRICLE PLAX 2D LVIDd:         3.20 cm   Diastology LVIDs:         2.50 cm   LV e' medial:    2.27 cm/s LV PW:         1.10 cm   LV E/e' medial:  46.3 LV IVS:        1.10 cm   LV e' lateral:   5.70 cm/s LVOT diam:     2.00 cm   LV E/e' lateral: 18.4 LVOT Area:     3.14 cm    RIGHT VENTRICLE RV Basal diam:  3.80 cm RV S prime:     9.37 cm/s TAPSE (M-mode): 1.2 cm  LEFT ATRIUM              Index        RIGHT ATRIUM           Index LA diam:        6.50 cm  4.35 cm/m   RA Area:     19.60 cm LA  Vol (A2C):   107.0 ml 71.63 ml/m  RA Volume:   63.40 ml  42.44 ml/m LA Vol (A4C):   78.8 ml  52.75 ml/m LA Biplane Vol: 96.6 ml  64.67 ml/m  AORTIC VALVE AV Vmax:      219.00 cm/s AV Vmean:     150.000 cm/s AV VTI:       0.351 m AV Peak Grad: 19.2 mmHg AV Mean Grad: 11.0 mmHg   AORTA Ao Root diam: 3.20 cm Ao Asc diam:  3.30 cm  MITRAL VALVE                TRICUSPID VALVE MV Area (PHT): 3.19 cm     TR Peak grad:   20.8 mmHg MV Peak grad:  6.8 mmHg     TR Mean grad:   15.0 mmHg MV Mean grad:  2.0 mmHg     TR Vmax:        228.00 cm/s MV Vmax:       1.30 m/s     TR Vmean:       189.0 cm/s MV Vmean:      69.9 cm/s MV Decel Time: 238 msec     SHUNTS MR Peak grad: 97.8 mmHg     Systemic Diam: 2.00 cm MR Mean grad: 62.0 mmHg MR Vmax:      494.50 cm/s MR Vmean:     375.5 cm/s MV E velocity: 105.00 cm/s  Chilton Si MD Electronically signed by Chilton Si MD Signature Date/Time: 11/23/2022/4:49:28 PM       Final     *Note: Due to a large number of results and/or encounters for the requested time period, some results have not been displayed. A complete set of results can be found in Results Review.    Scheduled Meds:  Chlorhexidine Gluconate Cloth  6 each Topical Daily   famotidine  20 mg Oral Daily   heparin  5,000 Units Subcutaneous Q8H   insulin aspart  0-6 Units Subcutaneous Q4H   [START ON 11/25/2022] levothyroxine  112 mcg Oral  Daily   mouth rinse  15 mL Mouth Rinse Q2H   polyethylene glycol  17 g Oral Daily   senna  1 tablet Oral BID   Continuous Infusions:  sodium chloride Stopped (11/24/22 0046)   sodium chloride Stopped (11/24/22 0806)   fentaNYL infusion INTRAVENOUS Stopped (11/24/22 0806)   norepinephrine (LEVOPHED) Adult infusion 3 mcg/min (11/24/22 1300)   piperacillin-tazobactam (ZOSYN)  IV     PRN Meds:.fentaNYL, fentaNYL (SUBLIMAZE) injection, fentaNYL (SUBLIMAZE) injection, midazolam, mouth rinse  Assessment & Plan:  [name] is a 72 y.o. 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 She is s/p emergent TVP.  Circulatory shock, encephalopathy Ventilator dependent respiratory failure Symptomatic bradycardia.s/p TVP Continue to hold beta blocker and AV nodal blocking agents.  She remains on low dose levophed.  TVP is set at 40 bpm but pt maintaining NSR with rates ~80 bpm. EP on board, potentially will pull TVP tomorrow morning.     PAF: Possibly sinus node dysfunction. Temp pacer while metoprolol washes out. EP following, appreciate further recs High CH2DS2VASc score but not on anticoagulation due to cirrhosis, portal hypertension, h/o GI bleeding.     Portal hypertension: Likely due to liver cirrhosis     Clotilde Dieter, DO 11/24/2022, 2:26 PM Office: [phone] Fax: [phone] Pager: [phone]

## 2022-11-24 NOTE — Progress Notes (Addendum)
eLink Physician-Brief Progress Note Patient Name: HARIKA JEANPHILIPPE DOB: 1951-01-13 MRN: [id number]   Date of Service  11/24/2022  HPI/Events of Note  Hypocalcemia and 6.9 Albumin: 3.3 Cr 1.5, improving  Symptomatic bradycardia.s/p TVP Continue to hold beta blocker and AV nodal blocking agents. Cirrhosis. EOP following.   eICU Interventions  Calcium gluconate IV ordered  Follow ion calcium in AM, if low replace again.      Intervention Category Intermediate Interventions: Electrolyte abnormality - evaluation and management  Ranee Gosselin 11/24/2022, 3:29 AM

## 2022-11-24 NOTE — Progress Notes (Signed)
NAME:  [name], MRN:  [id number], DOB:  May 13, 1951, LOS: 2 ADMISSION DATE:  11/22/2022, CONSULTATION DATE:  11/22/22 REFERRING MD:  EDP, CHIEF COMPLAINT:  acute hypoxic resp failure, symptomatic bradycardia   History of Present Illness:  72 yo female with pmh htn, hypothyroidism, cirrhosis, portal hypertension, paf not on chronic a/c, basilar artery aneurysm s/p coil presented today with encephalopathy, hypotension with bradycardia req cutaneous pacing to maintain BP. Pt was reportedly in her normal state of health until this afternoon when her daughter found her disoriented and diaphoretic. EMS was called and she was noted to be bradycardic and hypotensive. She was started on transcutaneous pacing.   Upon arrival to ed she was reportedly relatively stable while pacing but would have periods of loc, she underwent cth without acute abnormality found, labs revealed hyponatremia and aki with Cr (baseline 0.8) up to 2.32, hypocalcemia and elevated lft's. No clear indication of infection, no leukocytosis, afebrile, cxr clear.   Per report from EDP, cardiology was consulted and are seeing for TVP placement when pt became suddenly unresponsive req emergency ett, as such ccm has been asked to admit pt as cardiology does not feel this is cardiac in etiology.   Pt is currently intubated and sedation on vent and heading to cath lab for TVP placement.   Pertinent  Medical History  As above  Significant Hospital Events: Including procedures, antibiotic start and stop dates in addition to other pertinent events   Admitted to [facility] 11/22/22 Intubated, tvp placed 11/22/22  Interim History / Subjective:   Critically ill intubated on mechanical life support, TVP in place still remains on norepinephrine infusion  Objective   Blood pressure 103/66, pulse 79, temperature 99.1 F (37.3 C), resp. rate 19, height 5' (1.524 m), weight 55.4 kg, SpO2 99 %.    Vent Mode: CPAP;PSV FiO2 (%):  [40 %] 40 % Set  Rate:  [20 bmp] 20 bmp Vt Set:  [380 mL] 380 mL PEEP:  [5 cmH20] 5 cmH20 Pressure Support:  [5 cmH20] 5 cmH20 Plateau Pressure:  [15 cmH20-18 cmH20] 15 cmH20   Intake/Output Summary (Last 24 hours) at 11/24/2022 0817 Last data filed at 11/24/2022 0700 Gross per 24 hour  Intake 2196.09 ml  Output 1035 ml  Net 1161.09 ml   Filed Weights   11/22/22 2100 11/23/22 0500 11/24/22 0500  Weight: 50.3 kg 53.7 kg 55.4 kg    Examination: General: Elderly female intubated on mechanical life support critically ill HENT: High [place] AT, tracking appropriately Lungs: Clear to auscultation bilaterally, bilateral mechanically ventilated breath sounds Cardiovascular: Regular rate and rhythm, S1-S2, right IJ TVP in place Abdomen: Soft, nontender nondistended Extremities: No significant edema Neuro: Sedated on mechanical ventilation GU: Deferred  Resolved Hospital Problem list     Assessment & Plan:   Symptomatic bradycardia:  Paroxysmal atrial fibrillation at baseline (not on chronic a/c) H/o hypertension Plan: TVP is still in place Appreciate EP consultation Remains on norepinephrine to maintain mean arterial pressure greater than 65 Continue to titrate down Echocardiogram reviewed Holding home beta-blocker Presumed sinus node dysfunction will improve after holding. No indication for pacemaker placement at this time  Acute hypoxemic respiratory failure requiring intubation mechanical ventilation Plan: Adult mechanical vent protocol Continue to wean as tolerated SAT SBT this morning consideration of liberation from ventilator if passes.  Acute encephalopathy:  Encephalopathy has resolved, likely related to above  Aki Transaminitis:  Plan: Cirrhosis history at baseline, continue supportive care Strict I's and O's follow urine output avoid  nephrotoxic agents  Hypothyroidism:  Continue home levothyroxine  Hyponatremia Plan: Supportive care   Best Practice (right click and "Reselect  all SmartList Selections" daily)   Diet/type: NPO DVT prophylaxis: prophylactic heparin  GI prophylaxis: H2B Lines: Central line Foley:  Yes, and it is still needed Code Status:  full code Last date of multidisciplinary goals of care discussion [I attempted to call family yesterday no answer.  Labs   CBC: Recent Labs  Lab 11/22/22 1839 11/22/22 2125 11/22/22 2229 11/23/22 0140 11/24/22 0155  WBC 9.8  --   --  10.2 10.8*  NEUTROABS 8.3*  --   --   --  8.5*  HGB 11.8* 12.2 11.6* 12.1 11.1*  HCT 33.2* 36.0 34.0* 32.9* 29.7*  MCV 90.7  --   --  86.4 87.6  PLT 198  --   --  232 191    Basic Metabolic Panel: Recent Labs  Lab 11/22/22 1839 11/22/22 2125 11/22/22 2229 11/23/22 0140 11/24/22 0155  NA 126* 125* 126* 125* 126*  K 5.0 5.1 5.0 4.8 3.9  CL 89*  --   --  95* 98  CO2 21*  --   --  19* 18*  GLUCOSE 90  --   --  153* 113*  BUN 39*  --   --  37* 36*  CREATININE 2.32*  --   --  2.26* 1.51*  CALCIUM 8.4*  --   --  7.6* 6.9*  MG 1.9  --   --  1.8  --    GFR: Estimated Creatinine Clearance: 25.9 mL/min (A) (by C-G formula based on SCr of 1.51 mg/dL (H)). Recent Labs  Lab 11/22/22 1839 11/23/22 0137 11/23/22 0140 11/24/22 0155  WBC 9.8  --  10.2 10.8*  LATICACIDVEN  --  1.1  --   --     Liver Function Tests: Recent Labs  Lab 11/22/22 1839  AST 114*  ALT 57*  ALKPHOS 66  BILITOT 0.6  PROT 5.7*  ALBUMIN 3.3*   No results for input(s): "LIPASE", "AMYLASE" in the last 168 hours. Recent Labs  Lab 11/22/22 1839  AMMONIA 35    ABG    Component Value Date/Time   PHART 7.270 (L) 11/22/2022 2229   PCO2ART 39.1 11/22/2022 2229   PO2ART 193 (H) 11/22/2022 2229   HCO3 18.1 (L) 11/22/2022 2229   TCO2 19 (L) 11/22/2022 2229   ACIDBASEDEF 8.0 (H) 11/22/2022 2229   O2SAT 100 11/22/2022 2229     Coagulation Profile: Recent Labs  Lab 11/23/22 0137  INR 1.1    Cardiac Enzymes: No results for input(s): "CKTOTAL", "CKMB", "CKMBINDEX", "TROPONINI" in  the last 168 hours.  HbA1C: Hgb A1c MFr Bld  Date/Time Value Ref Range Status  11/23/2022 01:37 AM 4.9 4.8 - 5.6 % Final    Comment:    (NOTE)         Prediabetes: 5.7 - 6.4         Diabetes: >6.4         Glycemic control for adults with diabetes: <7.0   10/12/2020 03:01 PM 4.7 (L) 4.8 - 5.6 % Final    Comment:    REPEATED TO VERIFY (NOTE) Pre diabetes:          5.7%-6.4%  Diabetes:              >6.4%  Glycemic control for   <7.0% adults with diabetes     CBG: Recent Labs  Lab 11/23/22 1154 11/23/22 1558 11/23/22 2000 11/23/22  2259 11/24/22 0352  GLUCAP 132* 142* 136* 135* 114*    Review of Systems:   Unobtainable 2/2 pt intubated sedated status  Past Medical History:  She,  has a past medical history of ACROCHORDON (03/16/2010), Arthritis, Atrial fibrillation (HCC), Chronic kidney disease, COPD (chronic obstructive pulmonary disease) (HCC), GI bleeding (06/2018), Heart murmur, Hypertension, Hypothyroidism, Liver cirrhosis (HCC), Multiple duodenal ulcers (07/16/2018), and Thyroid disease.   Surgical History:   Past Surgical History:  Procedure Laterality Date   BIOPSY  07/16/2018   Procedure: BIOPSY;  Surgeon: Napoleon Form, MD;  Location: [facility];  Service: Endoscopy;;   BREAST EXCISIONAL BIOPSY Left    benign   CHOLECYSTECTOMY N/A 12/07/2015   Procedure: LAPAROSCOPIC CHOLECYSTECTOMY WITH INTRAOPERATIVE CHOLANGIOGRAM;  Surgeon: Ovidio Kin, MD;  Location: [facility] ORS;  Service: General;  Laterality: N/A;   COLONOSCOPY     ERCP N/A 12/06/2015   Procedure: ENDOSCOPIC RETROGRADE CHOLANGIOPANCREATOGRAPHY (ERCP);  Surgeon: Rachael Fee, MD;  Location: [facility];  Service: Endoscopy;  Laterality: N/A;   ESOPHAGOGASTRODUODENOSCOPY (EGD) WITH PROPOFOL N/A 07/16/2018   Procedure: ESOPHAGOGASTRODUODENOSCOPY (EGD) WITH PROPOFOL;  Surgeon: Napoleon Form, MD;  Location: [facility];  Service: Endoscopy;  Laterality: N/A;   IM NAILING TIBIA Right  07/19/2012   DR Despina Hick   IR ANGIO INTRA EXTRACRAN SEL COM CAROTID INNOMINATE BILAT MOD SED  12/12/2021   IR ANGIO VERTEBRAL SEL VERTEBRAL UNI L MOD SED  12/12/2021   IR ANGIO VERTEBRAL SEL VERTEBRAL UNI L MOD SED  01/09/2022   IR ANGIO VERTEBRAL SEL VERTEBRAL UNI R MOD SED  09/14/2022   IR ANGIOGRAM FOLLOW UP STUDY  01/09/2022   IR ANGIOGRAM FOLLOW UP STUDY  01/09/2022   IR ANGIOGRAM FOLLOW UP STUDY  01/09/2022   IR ANGIOGRAM FOLLOW UP STUDY  01/09/2022   IR ANGIOGRAM FOLLOW UP STUDY  01/09/2022   IR ANGIOGRAM FOLLOW UP STUDY  01/09/2022   IR ANGIOGRAM FOLLOW UP STUDY  01/09/2022   IR ANGIOGRAM FOLLOW UP STUDY  01/09/2022   IR ANGIOGRAM FOLLOW UP STUDY  01/09/2022   IR NEURO EACH ADD'L AFTER BASIC UNI LEFT (MS)  01/09/2022   IR TRANSCATH/EMBOLIZ  01/09/2022   IR US GUIDE VASC ACCESS RIGHT  09/14/2022   IR VERTEBROPLASTY CERV/THOR BX INC UNI/BIL INC/INJECT/IMAGING  10/17/2020   IR VERTEBROPLASTY EA ADDL (T&LS) BX INC UNI/BIL INC INJECT/IMAGING  10/17/2020   RADIOLOGY WITH ANESTHESIA N/A 01/09/2022   Procedure: Aneurysm embolization;  Surgeon: Lisbeth Renshaw, MD;  Location: [facility] OR;  Service: Radiology;  Laterality: N/A;   SHOULDER HEMI-ARTHROPLASTY Right 07/22/2013   Procedure: RIGHT SHOULDER HEMI-ARTHROPLASTY;  Surgeon: Verlee Rossetti, MD;  Location: [facility] OR;  Service: Orthopedics;  Laterality: Right;   TEMPORARY PACEMAKER N/A 11/22/2022   Procedure: TEMPORARY PACEMAKER;  Surgeon: Elder Negus, MD;  Location: [facility] INVASIVE CV LAB;  Service: Cardiovascular;  Laterality: N/A;   TIBIA IM NAIL INSERTION Right 07/19/2013   Procedure: INTRAMEDULLARY (IM) NAIL TIBIAL;  Surgeon: Loanne Drilling, MD;  Location: [facility] OR;  Service: Orthopedics;  Laterality: Right;   TUBAL LIGATION     UPPER GASTROINTESTINAL ENDOSCOPY       Social History:   reports that she has been smoking cigarettes. She has a 40.00 pack-year smoking history. She has never used smokeless tobacco. She reports that she does not currently  use alcohol. She reports that she does not use drugs.   Family History:  Her family history includes Colon polyps in her brother; Healthy in her daughter and  daughter; Heart disease in her brother, brother, and mother; Lung cancer in her father; Psoriasis in her brother. There is no history of Colon cancer, Esophageal cancer, Rectal cancer, or Stomach cancer.   Allergies No Known Allergies   Home Medications  Prior to Admission medications   Medication Sig Start Date End Date Taking? Authorizing Provider  albuterol (VENTOLIN HFA) 108 (90 Base) MCG/ACT inhaler Inhale 2 puffs into the lungs every 4 (four) hours as needed for wheezing or shortness of breath. 06/15/21   [provider]  aspirin EC 81 MG tablet Take 81 mg by mouth daily. Swallow whole.    [provider]  B Complex Vitamins (B COMPLEX 1 PO) Take 1 tablet by mouth daily.    [provider]  BRILINTA 90 MG TABS tablet Take 90 mg by mouth 2 (two) times daily. 12/25/21   [provider]  calcium carbonate (OSCAL) 1500 (600 Ca) MG TABS tablet Take 600 mg of elemental calcium by mouth daily with breakfast.    [provider]  citalopram (CELEXA) 20 MG tablet TAKE 1 TABLET BY MOUTH EVERY DAY. Patient taking differently: Take 20 mg by mouth daily. 03/17/14   Tyrone Nine, MD  diclofenac Sodium (VOLTAREN) 1 % GEL Apply 2 g topically 4 (four) times daily as needed (pain). 10/18/20   Zigmund Daniel., MD  folic acid (FOLVITE) 800 MCG tablet Take 800 mcg by mouth daily.    [provider]  gabapentin (NEURONTIN) 100 MG capsule Take 200 mg by mouth 2 (two) times daily. 02/21/21   [provider]  hydrALAZINE (APRESOLINE) 25 MG tablet Take 3 tablets (75 mg total) by mouth 3 (three) times daily. 10/18/20 08/24/23  Zigmund Daniel., MD  ipratropium-albuterol (DUONEB) 0.5-2.5 (3) MG/3ML SOLN Take 3 mLs by nebulization every 6 (six) hours as needed. 10/27/20   Burnadette Pop, MD   levothyroxine (SYNTHROID) 125 MCG tablet Take 125 mcg by mouth daily before breakfast. 08/18/21   [provider]  lisinopril (ZESTRIL) 40 MG tablet Take 40 mg by mouth daily. 09/26/20   [provider]  metoprolol succinate (TOPROL-XL) 50 MG 24 hr tablet TAKE 1 TABLET BY MOUTH ONCE DAILY WITH MEAL 01/25/22   Tolia, Sunit, DO  Multiple Vitamin (MULTIVITAMIN WITH MINERALS) TABS tablet Take 1 tablet by mouth daily.    [provider]  pantoprazole (PROTONIX) 40 MG tablet Take 1 tablet (40 mg total) by mouth at bedtime. 07/06/21 09/01/23  Meryl Dare, MD    This patient is critically ill with multiple organ system failure; which, requires frequent high complexity decision making, assessment, support, evaluation, and titration of therapies. This was completed through the application of advanced monitoring technologies and extensive interpretation of multiple databases. During this encounter critical care time was devoted to patient care services described in this note for 32 minutes.  Josephine Igo, DO [facility] 11/24/2022 8:17 AM

## 2022-11-24 NOTE — Progress Notes (Signed)
   Rounding Note    Patient Name: MANASVI BERNDT Date of Encounter: 11/24/2022  [facility] Cardiologist: None   Subjective   Extubated. Awake and alert. Oriented to person, place.  Vital Signs    Vitals:   11/24/22 0805 11/24/22 0815 11/24/22 0818 11/24/22 0830  BP:  119/66 104/79 112/67  Pulse:  75 83 81  Resp:  (!) 23 (!) 22 17  Temp:  99.1 F (37.3 C) 99.1 F (37.3 C) 99.1 F (37.3 C)  TempSrc:      SpO2: 99% 98% 96% 96%  Weight:      Height:        Intake/Output Summary (Last 24 hours) at 11/24/2022 0837 Last data filed at 11/24/2022 0700 Gross per 24 hour  Intake 2196.09 ml  Output 1035 ml  Net 1161.09 ml      11/24/2022    5:00 AM 11/23/2022    5:00 AM 11/22/2022    9:00 PM  Last 3 Weights  Weight (lbs) 122 lb 2.2 oz 118 lb 6.2 oz 111 lb  Weight (kg) 55.4 kg 53.7 kg 50.349 kg      Telemetry    Sinus rhythm with rates continuing to trend up, now in the 80s - Personally Reviewed  ECG    Personally Reviewed  Physical Exam   GEN: No acute distress.  Elderly. Cardiac: RRR, no murmurs, rubs, or gallops.  Respiratory: Clear to auscultation bilaterally. Psych: Normal affect   Assessment & Plan    72yo woman admitted with severe bradycardia in the setting of beta blocker use. Unclear if there was any other factor contributing to her presentation. Her HR's have improved after holdin beta blocker.  #Symptomatic bradycardia Resolved off metoprolol. TVP threshold <15mV this AM.  Maintain TVP 24 hours. If no further pacing between now and Sunday morning, DC TVP Sunday AM.  Hold all nodal blockers  EP to follow along.      Sheria Lang T. Lalla Brothers, MD, [facility], [facility] Cardiac Electrophysiology

## 2022-11-25 DIAGNOSIS — R001 Bradycardia, unspecified: Secondary | ICD-10-CM | POA: Diagnosis not present

## 2022-11-25 LAB — BASIC METABOLIC PANEL
Anion gap: 9 (ref 5–15)
BUN: 18 mg/dL (ref 8–23)
CO2: 21 mmol/L — ABNORMAL LOW (ref 22–32)
Calcium: 7.6 mg/dL — ABNORMAL LOW (ref 8.9–10.3)
Chloride: 100 mmol/L (ref 98–111)
Creatinine, Ser: 0.77 mg/dL (ref 0.44–1.00)
GFR, Estimated: >60 mL/min (ref >60)
Glucose, Bld: 80 mg/dL (ref 70–99)
Potassium: 5 mmol/L (ref 3.5–5.1)
Sodium: 130 mmol/L — ABNORMAL LOW (ref 135–145)

## 2022-11-25 LAB — RENAL FUNCTION PANEL
Albumin: 3.2 g/dL — ABNORMAL LOW (ref 3.5–5.0)
Anion gap: 7 (ref 5–15)
BUN: 11 mg/dL (ref 8–23)
CO2: 24 mmol/L (ref 22–32)
Calcium: 7.8 mg/dL — ABNORMAL LOW (ref 8.9–10.3)
Chloride: 99 mmol/L (ref 98–111)
Creatinine, Ser: 0.67 mg/dL (ref 0.44–1.00)
GFR, Estimated: >60 mL/min (ref >60)
Glucose, Bld: 106 mg/dL — ABNORMAL HIGH (ref 70–99)
Phosphorus: 1.4 mg/dL — ABNORMAL LOW (ref 2.5–4.6)
Potassium: 3.7 mmol/L (ref 3.5–5.1)
Sodium: 130 mmol/L — ABNORMAL LOW (ref 135–145)

## 2022-11-25 LAB — GLUCOSE, CAPILLARY
Glucose-Capillary: 80 mg/dL (ref 70–99)
Glucose-Capillary: 82 mg/dL (ref 70–99)
Glucose-Capillary: 108 mg/dL — ABNORMAL HIGH (ref 70–99)
Glucose-Capillary: 103 mg/dL — ABNORMAL HIGH (ref 70–99)

## 2022-11-25 LAB — CULTURE, BLOOD (ROUTINE X 2): Culture: NO GROWTH

## 2022-11-25 LAB — CALCIUM, IONIZED: Calcium, Ionized, Serum: 4.1 mg/dL — ABNORMAL LOW (ref 4.5–5.6)

## 2022-11-25 MED ORDER — MAGNESIUM SULFATE 2 GM/50ML IV SOLN
2.0000 g | Freq: Once | INTRAVENOUS | Status: AC
  Administered 2022-11-25: 2 g via INTRAVENOUS
  Filled 2022-11-25: qty 50

## 2022-11-25 MED ORDER — ORAL CARE MOUTH RINSE: 15.0000 mL | OROMUCOSAL | Status: DC | PRN

## 2022-11-25 MED ORDER — CITALOPRAM HYDROBROMIDE 20 MG PO TABS
20.0000 mg | ORAL_TABLET | Freq: Every day | ORAL | Status: DC
  Administered 2022-11-25 – 2022-11-27 (×3): 20 mg via ORAL
  Filled 2022-11-25 (×3): qty 1

## 2022-11-25 MED ORDER — POTASSIUM CHLORIDE 20 MEQ PO PACK
60.0000 meq | PACK | Freq: Once | ORAL | Status: AC
  Administered 2022-11-25: 60 meq via ORAL
  Filled 2022-11-25: qty 3

## 2022-11-25 NOTE — Progress Notes (Signed)
NAME:  [name], MRN:  [id number], DOB:  September 12, 1950, LOS: 3 ADMISSION DATE:  11/22/2022, CONSULTATION DATE:  11/22/22 REFERRING MD:  EDP, CHIEF COMPLAINT:  acute hypoxic resp failure, symptomatic bradycardia   History of Present Illness:  72 yo female with pmh htn, hypothyroidism, cirrhosis, portal hypertension, paf not on chronic a/c, basilar artery aneurysm s/p coil presented today with encephalopathy, hypotension with bradycardia req cutaneous pacing to maintain BP. Pt was reportedly in her normal state of health until this afternoon when her daughter found her disoriented and diaphoretic. EMS was called and she was noted to be bradycardic and hypotensive. She was started on transcutaneous pacing.   Upon arrival to ed she was reportedly relatively stable while pacing but would have periods of loc, she underwent cth without acute abnormality found, labs revealed hyponatremia and aki with Cr (baseline 0.8) up to 2.32, hypocalcemia and elevated lft's. No clear indication of infection, no leukocytosis, afebrile, cxr clear.   Per report from EDP, cardiology was consulted and are seeing for TVP placement when pt became suddenly unresponsive req emergency ett, as such ccm has been asked to admit pt as cardiology does not feel this is cardiac in etiology.   Pt is currently intubated and sedation on vent and heading to cath lab for TVP placement.   Pertinent  Medical History  As above  Significant Hospital Events: Including procedures, antibiotic start and stop dates in addition to other pertinent events   Admitted to [facility] 11/22/22 Intubated, tvp placed 11/22/22  Interim History / Subjective:   Doing well this morning postextubation.  TVP has been turned off heart rate has been stable.  Objective   Blood pressure 133/76, pulse 92, temperature 98.4 F (36.9 C), temperature source Bladder, resp. rate (!) 25, height 5' (1.524 m), weight 57 kg, SpO2 95 %.    Vent Mode: CPAP;PSV FiO2 (%):   [40 %] 40 % PEEP:  [5 cmH20] 5 cmH20 Pressure Support:  [5 cmH20] 5 cmH20   Intake/Output Summary (Last 24 hours) at 11/25/2022 0752 Last data filed at 11/25/2022 0600 Gross per 24 hour  Intake 525.97 ml  Output 2435 ml  Net -1909.03 ml   Filed Weights   11/23/22 0500 11/24/22 0500 11/25/22 0500  Weight: 53.7 kg 55.4 kg 57 kg    Examination: General: Elderly female resting comfortably in bed HENT: NCAT, tracking appropriately Lungs: Clear to auscultation bilaterally no crackles no wheeze Cardiovascular: Regular rate rhythm, S1-S2, right IJ TVP and Cordis in place. Abdomen: Soft, not tender nondistended Extremities: No significant edema Neuro: Sedated on mechanical support GU: Deferred  Resolved Hospital Problem list     Assessment & Plan:   Symptomatic bradycardia:  Paroxysmal atrial fibrillation at baseline (not on chronic a/c) H/o hypertension Plan: TVP removed, appreciate EP input. Off pressors Continue to hold beta-blocker. Needs to remain and observe on telemetry  Acute hypoxemic respiratory failure requiring intubation mechanical ventilation Plan: Resolved  Acute encephalopathy:  Encephalopathy has resolved, likely related to above, resolved  Aki Transaminitis:  Plan: History of baseline cirrhosis, continue supportive care Avoid nephrotoxic agents follow urine output  Hypothyroidism:  Continue home levothyroxine  Hyponatremia Plan: Supportive care  PT OT consulted   Best Practice (right click and "Reselect all SmartList Selections" daily)   Diet/type: NPO DVT prophylaxis: prophylactic heparin  GI prophylaxis: H2B Lines: Central line Foley:  Yes, and it is still needed Code Status:  full code Last date of multidisciplinary goals of care discussion with patient  Labs   CBC: Recent Labs  Lab 11/22/22 1839 11/22/22 2125 11/22/22 2229 11/23/22 0140 11/24/22 0155  WBC 9.8  --   --  10.2 10.8*  NEUTROABS 8.3*  --   --   --  8.5*  HGB  11.8* 12.2 11.6* 12.1 11.1*  HCT 33.2* 36.0 34.0* 32.9* 29.7*  MCV 90.7  --   --  86.4 87.6  PLT 198  --   --  232 191    Basic Metabolic Panel: Recent Labs  Lab 11/22/22 1839 11/22/22 2125 11/22/22 2229 11/23/22 0140 11/24/22 0155 11/24/22 2228 11/25/22 0121  NA 126* 125* 126* 125* 126*  --  130*  K 5.0 5.1 5.0 4.8 3.9 3.4* 5.0  CL 89*  --   --  95* 98  --  100  CO2 21*  --   --  19* 18*  --  21*  GLUCOSE 90  --   --  153* 113*  --  80  BUN 39*  --   --  37* 36*  --  18  CREATININE 2.32*  --   --  2.26* 1.51*  --  0.77  CALCIUM 8.4*  --   --  7.6* 6.9*  --  7.6*  MG 1.9  --   --  1.8  --  1.8  --    GFR: Estimated Creatinine Clearance: 49.5 mL/min (by C-G formula based on SCr of 0.77 mg/dL). Recent Labs  Lab 11/22/22 1839 11/23/22 0137 11/23/22 0140 11/24/22 0155  WBC 9.8  --  10.2 10.8*  LATICACIDVEN  --  1.1  --   --     Liver Function Tests: Recent Labs  Lab 11/22/22 1839  AST 114*  ALT 57*  ALKPHOS 66  BILITOT 0.6  PROT 5.7*  ALBUMIN 3.3*   No results for input(s): "LIPASE", "AMYLASE" in the last 168 hours. Recent Labs  Lab 11/22/22 1839  AMMONIA 35    ABG    Component Value Date/Time   PHART 7.270 (L) 11/22/2022 2229   PCO2ART 39.1 11/22/2022 2229   PO2ART 193 (H) 11/22/2022 2229   HCO3 18.1 (L) 11/22/2022 2229   TCO2 19 (L) 11/22/2022 2229   ACIDBASEDEF 8.0 (H) 11/22/2022 2229   O2SAT 100 11/22/2022 2229     Coagulation Profile: Recent Labs  Lab 11/23/22 0137  INR 1.1    Cardiac Enzymes: No results for input(s): "CKTOTAL", "CKMB", "CKMBINDEX", "TROPONINI" in the last 168 hours.  HbA1C: Hgb A1c MFr Bld  Date/Time Value Ref Range Status  11/23/2022 01:37 AM 4.9 4.8 - 5.6 % Final    Comment:    (NOTE)         Prediabetes: 5.7 - 6.4         Diabetes: >6.4         Glycemic control for adults with diabetes: <7.0   10/12/2020 03:01 PM 4.7 (L) 4.8 - 5.6 % Final    Comment:    REPEATED TO VERIFY (NOTE) Pre diabetes:           5.7%-6.4%  Diabetes:              >6.4%  Glycemic control for   <7.0% adults with diabetes     CBG: Recent Labs  Lab 11/24/22 1615 11/24/22 1944 11/24/22 2341 11/25/22 0354 11/25/22 0740  GLUCAP 98 96 90 80 82     Josephine Igo, DO [facility] 11/25/2022 7:52 AM

## 2022-11-25 NOTE — Progress Notes (Signed)
   Rounding Note    Patient Name: NYALAH HANN Date of Encounter: 11/25/2022  [facility] Cardiologist: None   Subjective   NAEO. No pacing overnight. Pacer is turned off.  Vital Signs    Vitals:   11/25/22 0557 11/25/22 0600 11/25/22 0700 11/25/22 0800  BP:  133/76 135/74 130/70  Pulse: 89 92 94 93  Resp: (!) 26 (!) 25 (!) 37 (!) 21  Temp: 98.4 F (36.9 C) 98.4 F (36.9 C) 98.4 F (36.9 C) 98.6 F (37 C)  TempSrc:  Bladder  Bladder  SpO2: 95% 95% 96% 96%  Weight:      Height:        Intake/Output Summary (Last 24 hours) at 11/25/2022 0926 Last data filed at 11/25/2022 0800 Gross per 24 hour  Intake 453.45 ml  Output 2761 ml  Net -2307.55 ml      11/25/2022    5:00 AM 11/24/2022    5:00 AM 11/23/2022    5:00 AM  Last 3 Weights  Weight (lbs) 125 lb 10.6 oz 122 lb 2.2 oz 118 lb 6.2 oz  Weight (kg) 57 kg 55.4 kg 53.7 kg      Telemetry    Personally Reviewed - no appropriate pacing. There was some inappropriate pacing this AM due to poor sensing.  ECG    Personally Reviewed  Physical Exam   GEN: No acute distress.   Cardiac: RRR, no murmurs, rubs, or gallops.  Respiratory: Clear to auscultation bilaterally. Psych: Normal affect   Assessment & Plan    72yo woman admitted with severe bradycardia in the setting of beta blocker use. Unclear if there was any other factor contributing to her presentation. Her HR's have improved after holding beta blocker.   #Symptomatic bradycardia Resolved off metoprolol. TVP removed this AM without difficulty. Hold all nodal blockers Transfer to RNF. Recommend observation for 24 hours on tele prior to discharge.  EP will sign off. Please call with questions/concerns.    Sheria Lang T. Lalla Brothers, MD, [facility], [facility] Cardiac Electrophysiology

## 2022-11-25 NOTE — Progress Notes (Signed)
eLink Physician-Brief Progress Note Patient Name: [name] DOB: 10-Mar-1951 MRN: [id number]   Date of Service  11/25/2022  HPI/Events of Note  K+ 3.4 and Mag 1.8   eICU Interventions  Kcl and magnesium ordered     Intervention Category Minor Interventions: Electrolytes abnormality - evaluation and management  [name] 11/25/2022, 12:00 AM

## 2022-11-25 NOTE — Progress Notes (Signed)
Subjective:  Patient seen and examined at bedside, she is feeling better. TVP removed this morning. She is maintaining good heart rates on her own.  Intake/Output from previous day:  I/O last 3 completed shifts: In: 1816.8 [P.O.:180; I.V.:1318.2; IV Piggyback:318.6] Out: 3215 [Urine:3190; Emesis/NG output:25] Total I/O In: 12.5 [IV Piggyback:12.5] Out: 876 [Urine:875; Stool:1] Net IO Since Admission: -985.41 mL [11/25/22 1430]  Blood pressure 135/79, pulse 90, temperature 98 F (36.7 C), temperature source Oral, resp. rate 19, height 5' (1.524 m), weight 57 kg, SpO2 93 %. Physical Exam Vitals reviewed.  HENT:     Head: Normocephalic and atraumatic.  Cardiovascular:     Rate and Rhythm: Normal rate and regular rhythm.     Heart sounds: Murmur heard.  Pulmonary:     Effort: Pulmonary effort is normal.  Abdominal:     General: Bowel sounds are normal.  Musculoskeletal:     Right lower leg: No edema.     Left lower leg: No edema.  Skin:    General: Skin is warm and dry.  Neurological:     Mental Status: She is alert.     Lab Results: Lab Results  Component Value Date   NA 130 (L) 11/25/2022   K 3.7 11/25/2022   CO2 24 11/25/2022   GLUCOSE 106 (H) 11/25/2022   BUN 11 11/25/2022   CREATININE 0.67 11/25/2022   CALCIUM 7.8 (L) 11/25/2022   GFRNONAA >60 11/25/2022    BNP (last 3 results) Recent Labs    11/23/22 0137  BNP 2,297.1*     ProBNP (last 3 results) No results for input(s): "PROBNP" in the last 8760 hours.    Latest Ref Rng & Units 11/25/2022    9:39 AM 11/25/2022    1:21 AM 11/24/2022   10:28 PM  BMP  Glucose 70 - 99 mg/dL 638  80    BUN 8 - 23 mg/dL 11  18    Creatinine 7.56 - 1.00 mg/dL 4.33  2.95    Sodium 188 - 145 mmol/L 130  130    Potassium 3.5 - 5.1 mmol/L 3.7  5.0  3.4   Chloride 98 - 111 mmol/L 99  100    CO2 22 - 32 mmol/L 24  21    Calcium 8.9 - 10.3 mg/dL 7.8  7.6        Latest Ref Rng & Units 11/25/2022    9:39 AM 11/22/2022    6:39 PM  01/05/2022    2:29 PM  Hepatic Function  Total Protein 6.5 - 8.1 g/dL  5.7  6.7   Albumin 3.5 - 5.0 g/dL 3.2  3.3  4.2   AST 15 - 41 U/L  114  18   ALT 0 - 44 U/L  57  13   Alk Phosphatase 38 - 126 U/L  66  65   Total Bilirubin 0.3 - 1.2 mg/dL  0.6  2.0   Bilirubin, Direct 0.0 - 0.2 mg/dL  0.2        Latest Ref Rng & Units 11/24/2022    1:55 AM 11/23/2022    1:40 AM 11/22/2022   10:29 PM  CBC  WBC 4.0 - 10.5 K/uL 10.8  10.2    Hemoglobin 12.0 - 15.0 g/dL 41.6  60.6  30.1   Hematocrit 36.0 - 46.0 % 29.7  32.9  34.0   Platelets 150 - 400 K/uL 191  232     Lipid Panel     Component Value Date/Time   CHOL  139 07/09/2018 1114   TRIG 75 07/09/2018 1114   HDL 58 07/09/2018 1114   CHOLHDL 2.4 07/09/2018 1114   VLDL 26 12/06/2015 0426   LDLCALC 65 07/09/2018 1114   Cardiac Panel (last 3 results) No results for input(s): "CKTOTAL", "CKMB", "TROPONINI", "RELINDX" in the last 72 hours.  HEMOGLOBIN A1C Lab Results  Component Value Date   HGBA1C 4.9 11/23/2022   MPG 94 11/23/2022   TSH Recent Labs    11/22/22 1839  TSH 0.955    Imaging: Imaging results have been reviewed and DG CHEST PORT 1 VIEW  Result Date: 11/24/2022 CLINICAL DATA:  Central line placement EXAM: PORTABLE CHEST 1 VIEW COMPARISON:  11/23/2022 and older studies. FINDINGS: Right internal jugular single lead pacemaker, electrode projecting in the right ventricle, stable from the previous day's exam. No visualized central line. Endotracheal tube and nasal/orogastric tube have been removed. Hazy opacity at the right lung base has increased from the previous day's exam. Mild left lung base opacity is stable. Remainder of the lungs is clear. Small effusion suspected. No pneumothorax. IMPRESSION: 1. No visualized central line. 2. Status post removal of the endotracheal tube and nasogastric tube. 3. Increased opacity at the right lung base since the previous day's exam suspected to be a combination of atelectasis and pleural  fluid. Electronically Signed   By: Amie Portland M.D.   On: 11/24/2022 13:59   ECHOCARDIOGRAM COMPLETE  Result Date: 11/23/2022    ECHOCARDIOGRAM REPORT   Patient Name:   [name] Date of Exam: 11/23/2022 Medical Rec #:  [id number]         Height:       60.0 in Accession #:    [id number]        Weight:       118.4 lb Date of Birth:  Jun 05, 1951         BSA:          1.494 m Patient Age:    72 years          BP:           96/65 mmHg Patient Gender: F                 HR:           71 bpm. Exam Location:  Inpatient Procedure: 2D Echo, Cardiac Doppler and Color Doppler Indications:    Abnormal ECG  History:        Patient has prior history of Echocardiogram examinations, most                 recent 10/10/2020. COPD, Arrythmias:Atrial Fibrillation,                 Signs/Symptoms:Murmur; Risk Factors:Hypertension. CKD.  Sonographer:    Milda Smart Referring Phys: [id number] JESSICA MARSHALL  Sonographer Comments: Image acquisition challenging due to respiratory motion. Patient supine and on a ventilator at time of exam. IMPRESSIONS  1. Intracavitary gradient. Peak velocity 1.23 m/s. Peak gradient 6 mmHg. Left ventricular ejection fraction, by estimation, is 65 to 70%. The left ventricle has normal function. The left ventricle has no regional wall motion abnormalities. There is mild  concentric left ventricular hypertrophy. Left ventricular diastolic parameters are indeterminate. Elevated left ventricular end-diastolic pressure.  2. Right ventricular systolic function is normal. The right ventricular size is normal. There is normal pulmonary artery systolic pressure.  3. Left atrial size was severely dilated.  4. Right atrial size was severely dilated.  5. The  mitral valve is degenerative. Mild mitral valve regurgitation. No evidence of mitral stenosis.  6. Tricuspid valve regurgitation is moderate to severe.  7. The aortic valve is tricuspid. There is moderate calcification of the aortic valve. There is moderate  thickening of the aortic valve. Aortic valve regurgitation is not visualized. Mild aortic valve stenosis. Aortic valve mean gradient measures 11.0 mmHg. Aortic valve Vmax measures 2.19 m/s.  8. The inferior vena cava is normal in size with greater than 50% respiratory variability, suggesting right atrial pressure of 3 mmHg. FINDINGS  Left Ventricle: Intracavitary gradient. Peak velocity 1.23 m/s. Peak gradient 6 mmHg. Left ventricular ejection fraction, by estimation, is 65 to 70%. The left ventricle has normal function. The left ventricle has no regional wall motion abnormalities. The left ventricular internal cavity size was normal in size. There is mild concentric left ventricular hypertrophy. Left ventricular diastolic parameters are indeterminate. Elevated left ventricular end-diastolic pressure. Right Ventricle: The right ventricular size is normal. No increase in right ventricular wall thickness. Right ventricular systolic function is normal. There is normal pulmonary artery systolic pressure. The tricuspid regurgitant velocity is 2.28 m/s, and  with an assumed right atrial pressure of 3 mmHg, the estimated right ventricular systolic pressure is 23.8 mmHg. Left Atrium: Left atrial size was severely dilated. Right Atrium: Right atrial size was severely dilated. Pericardium: There is no evidence of pericardial effusion. Mitral Valve: The mitral valve is degenerative in appearance. Mild mitral valve regurgitation. No evidence of mitral valve stenosis. MV peak gradient, 6.8 mmHg. The mean mitral valve gradient is 2.0 mmHg. Tricuspid Valve: The tricuspid valve is normal in structure. Tricuspid valve regurgitation is moderate to severe. No evidence of tricuspid stenosis. Aortic Valve: The aortic valve is tricuspid. There is moderate calcification of the aortic valve. There is moderate thickening of the aortic valve. Aortic valve regurgitation is not visualized. Mild aortic stenosis is present. Aortic valve mean  gradient measures 11.0 mmHg. Aortic valve peak gradient measures 19.2 mmHg. Pulmonic Valve: The pulmonic valve was normal in structure. Pulmonic valve regurgitation is not visualized. No evidence of pulmonic stenosis. Aorta: The aortic root is normal in size and structure. Venous: The inferior vena cava is normal in size with greater than 50% respiratory variability, suggesting right atrial pressure of 3 mmHg. IAS/Shunts: No atrial level shunt detected by color flow Doppler.  LEFT VENTRICLE PLAX 2D LVIDd:         3.20 cm   Diastology LVIDs:         2.50 cm   LV e' medial:    2.27 cm/s LV PW:         1.10 cm   LV E/e' medial:  46.3 LV IVS:        1.10 cm   LV e' lateral:   5.70 cm/s LVOT diam:     2.00 cm   LV E/e' lateral: 18.4 LVOT Area:     3.14 cm  RIGHT VENTRICLE RV Basal diam:  3.80 cm RV S prime:     9.37 cm/s TAPSE (M-mode): 1.2 cm LEFT ATRIUM              Index        RIGHT ATRIUM           Index LA diam:        6.50 cm  4.35 cm/m   RA Area:     19.60 cm LA Vol (A2C):   107.0 ml 71.63 ml/m  RA Volume:   63.40  ml  42.44 ml/m LA Vol (A4C):   78.8 ml  52.75 ml/m LA Biplane Vol: 96.6 ml  64.67 ml/m  AORTIC VALVE AV Vmax:      219.00 cm/s AV Vmean:     150.000 cm/s AV VTI:       0.351 m AV Peak Grad: 19.2 mmHg AV Mean Grad: 11.0 mmHg  AORTA Ao Root diam: 3.20 cm Ao Asc diam:  3.30 cm MITRAL VALVE                TRICUSPID VALVE MV Area (PHT): 3.19 cm     TR Peak grad:   20.8 mmHg MV Peak grad:  6.8 mmHg     TR Mean grad:   15.0 mmHg MV Mean grad:  2.0 mmHg     TR Vmax:        228.00 cm/s MV Vmax:       1.30 m/s     TR Vmean:       189.0 cm/s MV Vmean:      69.9 cm/s MV Decel Time: 238 msec     SHUNTS MR Peak grad: 97.8 mmHg     Systemic Diam: 2.00 cm MR Mean grad: 62.0 mmHg MR Vmax:      494.50 cm/s MR Vmean:     375.5 cm/s MV E velocity: 105.00 cm/s Chilton Si MD Electronically signed by Chilton Si MD Signature Date/Time: 11/23/2022/4:49:28 PM    Final     Cardiac Studies:  Telemetry  11/23/2022: transvenous packing at rate of 70 bpm   EKG 11/22/2022: Junctional bradycardia 35 bpm   Echocardiogram 10/10/2020:  1. Left ventricular ejection fraction, by estimation, is 55 to 60%. The  left ventricle has normal function. The left ventricle has no regional  wall motion abnormalities. There is moderate asymmetric hypertrophy of the  basal septal segment. The rest of  the LV segments demonstrate mild left ventricular hypertrophy. Left  ventricular diastolic parameters are consistent with Grade I diastolic  dysfunction (impaired relaxation). Elevated left atrial pressure.   2. Right ventricular systolic function is normal. The right ventricular  size is mildly enlarged. There is severely elevated pulmonary artery  systolic pressure. The estimated right ventricular systolic pressure is  60.8 mmHg.   3. Left atrial size was severely dilated.   4. Right atrial size was mildly dilated.   5. The mitral valve is abnormal. There are mildly elevated gradients with  flow acceleration noted across the mitral valve with mean gradient  at HR 77 bpm. MVA by continuity 2.7cm2 with no significant mitral stenosis.  Mild mitral valve  regurgitation.   6. The aortic valve is tricuspid. There is mild calcification of the  aortic valve. There is moderate thickening of the aortic valve. The LCC  appears fixed with trivial AR. Mild to moderate aortic valve  sclerosis/calcification is present, without any  evidence of aortic stenosis.   7. The inferior vena cava is normal in size with greater than 50%  respiratory variability, suggesting right atrial pressure of 3 mmHg.    Recent Results (from the past 09811 hour(s))  ECHOCARDIOGRAM COMPLETE   Collection Time: 11/23/22  3:15 PM  Result Value   Weight 1,894.19   Height 60   BP 86/63   S' Lateral 2.50   AV Mean grad 11.0   AV Peak grad 19.2   Ao pk vel 2.19   Area-P 1/2 3.19   MV M vel 4.95   MV Peak grad 97.8  Est EF 65 - 70%    Narrative      ECHOCARDIOGRAM REPORT       Patient Name:   [name] Date of Exam: 11/23/2022 Medical Rec #:  [id number]         Height:       60.0 in Accession #:    [id number]        Weight:       118.4 lb Date of Birth:  1951/06/08         BSA:          1.494 m Patient Age:    72 years          BP:           96/65 mmHg Patient Gender: F                 HR:           71 bpm. Exam Location:  Inpatient  Procedure: 2D Echo, Cardiac Doppler and Color Doppler  Indications:    Abnormal ECG   History:        Patient has prior history of Echocardiogram examinations, most                 recent 10/10/2020. COPD, Arrythmias:Atrial Fibrillation,                 Signs/Symptoms:Murmur; Risk Factors:Hypertension. CKD.   Sonographer:    Milda Smart Referring Phys: [id number] JESSICA MARSHALL    Sonographer Comments: Image acquisition challenging due to respiratory motion. Patient supine and on a ventilator at time of exam. IMPRESSIONS    1. Intracavitary gradient. Peak velocity 1.23 m/s. Peak gradient 6 mmHg. Left ventricular ejection fraction, by estimation, is 65 to 70%. The left ventricle has normal function. The left ventricle has no regional wall motion abnormalities. There is mild  concentric left ventricular hypertrophy. Left ventricular diastolic parameters are indeterminate. Elevated left ventricular end-diastolic pressure.  2. Right ventricular systolic function is normal. The right ventricular size is normal. There is normal pulmonary artery systolic pressure.  3. Left atrial size was severely dilated.  4. Right atrial size was severely dilated.  5. The mitral valve is degenerative. Mild mitral valve regurgitation. No evidence of mitral stenosis.  6. Tricuspid valve regurgitation is moderate to severe.  7. The aortic valve is tricuspid. There is moderate calcification of the aortic valve. There is moderate thickening of the aortic valve. Aortic valve regurgitation is not  visualized. Mild aortic valve stenosis. Aortic valve mean gradient measures 11.0  mmHg. Aortic valve Vmax measures 2.19 m/s.  8. The inferior vena cava is normal in size with greater than 50% respiratory variability, suggesting right atrial pressure of 3 mmHg.  FINDINGS  Left Ventricle: Intracavitary gradient. Peak velocity 1.23 m/s. Peak gradient 6 mmHg. Left ventricular ejection fraction, by estimation, is 65 to 70%. The left ventricle has normal function. The left ventricle has no regional wall motion abnormalities.  The left ventricular internal cavity size was normal in size. There is mild concentric left ventricular hypertrophy. Left ventricular diastolic parameters are indeterminate. Elevated left ventricular end-diastolic pressure.  Right Ventricle: The right ventricular size is normal. No increase in right ventricular wall thickness. Right ventricular systolic function is normal. There is normal pulmonary artery systolic pressure. The tricuspid regurgitant velocity is 2.28 m/s, and  with an assumed right atrial pressure of 3 mmHg, the estimated right ventricular systolic pressure is 23.8 mmHg.  Left Atrium: Left atrial size  was severely dilated.  Right Atrium: Right atrial size was severely dilated.  Pericardium: There is no evidence of pericardial effusion.  Mitral Valve: The mitral valve is degenerative in appearance. Mild mitral valve regurgitation. No evidence of mitral valve stenosis. MV peak gradient, 6.8 mmHg. The mean mitral valve gradient is 2.0 mmHg.  Tricuspid Valve: The tricuspid valve is normal in structure. Tricuspid valve regurgitation is moderate to severe. No evidence of tricuspid stenosis.  Aortic Valve: The aortic valve is tricuspid. There is moderate calcification of the aortic valve. There is moderate thickening of the aortic valve. Aortic valve regurgitation is not visualized. Mild aortic stenosis is present. Aortic valve mean gradient  measures 11.0 mmHg. Aortic  valve peak gradient measures 19.2 mmHg.  Pulmonic Valve: The pulmonic valve was normal in structure. Pulmonic valve regurgitation is not visualized. No evidence of pulmonic stenosis.  Aorta: The aortic root is normal in size and structure.  Venous: The inferior vena cava is normal in size with greater than 50% respiratory variability, suggesting right atrial pressure of 3 mmHg.  IAS/Shunts: No atrial level shunt detected by color flow Doppler.    LEFT VENTRICLE PLAX 2D LVIDd:         3.20 cm   Diastology LVIDs:         2.50 cm   LV e' medial:    2.27 cm/s LV PW:         1.10 cm   LV E/e' medial:  46.3 LV IVS:        1.10 cm   LV e' lateral:   5.70 cm/s LVOT diam:     2.00 cm   LV E/e' lateral: 18.4 LVOT Area:     3.14 cm    RIGHT VENTRICLE RV Basal diam:  3.80 cm RV S prime:     9.37 cm/s TAPSE (M-mode): 1.2 cm  LEFT ATRIUM              Index        RIGHT ATRIUM           Index LA diam:        6.50 cm  4.35 cm/m   RA Area:     19.60 cm LA Vol (A2C):   107.0 ml 71.63 ml/m  RA Volume:   63.40 ml  42.44 ml/m LA Vol (A4C):   78.8 ml  52.75 ml/m LA Biplane Vol: 96.6 ml  64.67 ml/m  AORTIC VALVE AV Vmax:      219.00 cm/s AV Vmean:     150.000 cm/s AV VTI:       0.351 m AV Peak Grad: 19.2 mmHg AV Mean Grad: 11.0 mmHg   AORTA Ao Root diam: 3.20 cm Ao Asc diam:  3.30 cm  MITRAL VALVE                TRICUSPID VALVE MV Area (PHT): 3.19 cm     TR Peak grad:   20.8 mmHg MV Peak grad:  6.8 mmHg     TR Mean grad:   15.0 mmHg MV Mean grad:  2.0 mmHg     TR Vmax:        228.00 cm/s MV Vmax:       1.30 m/s     TR Vmean:       189.0 cm/s MV Vmean:      69.9 cm/s MV Decel Time: 238 msec     SHUNTS MR Peak grad: 97.8 mmHg     Systemic Diam:  2.00 cm MR Mean grad: 62.0 mmHg MR Vmax:      494.50 cm/s MR Vmean:     375.5 cm/s MV E velocity: 105.00 cm/s  Chilton Si MD Electronically signed by Chilton Si MD Signature Date/Time: 11/23/2022/4:49:28 PM       Final      *Note: Due to a large number of results and/or encounters for the requested time period, some results have not been displayed. A complete set of results can be found in Results Review.    Scheduled Meds:  Chlorhexidine Gluconate Cloth  6 each Topical Daily   citalopram  20 mg Oral Daily   famotidine  20 mg Oral Daily   heparin  5,000 Units Subcutaneous Q8H   insulin aspart  0-6 Units Subcutaneous Q4H   levothyroxine  112 mcg Oral Daily   senna  1 tablet Oral BID   Continuous Infusions:  sodium chloride Stopped (11/24/22 0046)   sodium chloride Stopped (11/24/22 0806)   norepinephrine (LEVOPHED) Adult infusion Stopped (11/24/22 1726)   piperacillin-tazobactam (ZOSYN)  IV 12.5 mL/hr at 11/25/22 0800   PRN Meds:.mouth rinse  Assessment & Plan:  [name] is a 72 y.o. 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 She is s/p emergent TVP.  Circulatory shock, encephalopathy Ventilator dependent respiratory failure Symptomatic bradycardia.s/p TVP Continue to hold beta blocker and AV nodal blocking agents.  She had her TVP removed this morning. Continues to maintain normal rates. Will not restart on beta blocker. Follow-up with [place] in 1-2 weeks in  office. Likely can be discharged tomorrow, cardiology will sign off at this time.     PAF: Patient maintaining NSR without TVP. High CH2DS2VASc score but not on anticoagulation due to cirrhosis, portal hypertension, h/o GI bleeding.     Portal hypertension: Likely due to liver cirrhosis     Clotilde Dieter, DO 11/25/2022, 2:30 PM Office: [phone] Fax: [phone] Pager: [phone]

## 2022-11-25 NOTE — Evaluation (Signed)
Physical Therapy Evaluation Patient Details Name: [name] MRN: [id number] DOB: 1951/04/10 Today's Date: 11/25/2022  History of Present Illness  Patient is 72 y.o. female presented to ED with encephalopathy, hypotension, and bradycardia requiring  cutaneous pacing to maintain BP. Pt admitted with severe bradycardia in the setting of beta blocker use. Pt required intubation and TVP placement on 5/30 and was extubated 6/1 and TVP dc on 6/2. PMH significant for HTN, hypothyroidism, cirrhosis, portal hypertension, PAF not on chronic a/c, basilar artery aneurysm s/p coil, COPD.   Clinical Impression  [name] is 72 y.o. female admitted with above HPI and diagnosis. Patient is currently limited by functional impairments below (see PT problem list). Patient lives with her daughter and grandchildren and is independent with RW at baseline. Currently pt requires cues for safety with transfers and gait with RW. She is unsteady and has poor safety awareness navigating hospital room and hallway requiring assist and cues to avoid obstacles and avoid hitting RW into wall. Patient will benefit from continued skilled PT interventions to address impairments and progress independence with mobility, recommend constant supervision and assist for mobility from family. Acute PT will follow and progress as able.        Recommendations for follow up therapy are one component of a multi-disciplinary discharge planning process, led by the attending physician.  Recommendations may be updated based on patient status, additional functional criteria and insurance authorization.  Follow Up Recommendations       Assistance Recommended at Discharge Frequent or constant Supervision/Assistance  Patient can return home with the following  A little help with walking and/or transfers;A little help with bathing/dressing/bathroom;Assistance with cooking/housework;Direct supervision/assist for medications management;Help  with stairs or ramp for entrance;Assist for transportation    Equipment Recommendations None recommended by PT  Recommendations for Other Services       Functional Status Assessment Patient has had a recent decline in their functional status and demonstrates the ability to make significant improvements in function in a reasonable and predictable amount of time.     Precautions / Restrictions Precautions Precautions: Fall Precaution Comments: watch HR and BP Restrictions Weight Bearing Restrictions: No      Mobility  Bed Mobility               General bed mobility comments: pt OOB in recliner    Transfers Overall transfer level: Needs assistance Equipment used: Rolling walker (2 wheels) Transfers: Sit to/from Stand Sit to Stand: Min assist           General transfer comment: cues for hand palcement at recliner and on toilet with use of grab bars to power up and control lowering. min assist needed to steady balance in standing.    Ambulation/Gait Ambulation/Gait assistance: Min assist Gait Distance (Feet): 150 Feet Assistive device: Rolling walker (2 wheels) Gait Pattern/deviations: Step-through pattern, Decreased step length - right, Decreased step length - left, Decreased stride length, Trunk flexed, Drifts right/left Gait velocity: decr     General Gait Details: Pt required constant min assist to manage walker. pt has tendency to advance walker too far forward and demonstrates impaired safety awareness with poor scanning of environment and bumping walker into obstalces/walls multiple occasions requiring cues and assist to correct for safety. VSS on 2L/min.  Stairs            Wheelchair Mobility    Modified Rankin (Stroke Patients Only)       Balance Overall balance assessment: Needs assistance Sitting-balance support: Feet  supported Sitting balance-Leahy Scale: Good     Standing balance support: Reliant on assistive device for balance, During  functional activity, Bilateral upper extremity supported Standing balance-Leahy Scale: Poor                               Pertinent Vitals/Pain Pain Assessment Pain Assessment: No/denies pain    Home Living Family/patient expects to be discharged to:: Private residence Living Arrangements: Children;Other relatives Available Help at Discharge: Family Type of Home: House Home Access: Stairs to enter Entrance Stairs-Rails: Right Entrance Stairs-Number of Steps: 4-6 (pt unsure)   Home Layout: One level Home Equipment: Agricultural consultant (2 wheels);BSC/3in1      Prior Function Prior Level of Function : Independent/Modified Independent                     Hand Dominance   Dominant Hand: Right    Extremity/Trunk Assessment   Upper Extremity Assessment Upper Extremity Assessment: Overall WFL for tasks assessed    Lower Extremity Assessment Lower Extremity Assessment: Overall WFL for tasks assessed    Cervical / Trunk Assessment Cervical / Trunk Assessment: Kyphotic  Communication   Communication: No difficulties  Cognition Arousal/Alertness: Awake/alert Behavior During Therapy: WFL for tasks assessed/performed Overall Cognitive Status: No family/caregiver present to determine baseline cognitive functioning Area of Impairment: Attention, Following commands, Safety/judgement, Awareness, Problem solving, Memory, Orientation                 Orientation Level: Disoriented to, Time, Situation Current Attention Level: Selective Memory: Decreased short-term memory Following Commands: Follows one step commands consistently, Follows multi-step commands inconsistently Safety/Judgement: Decreased awareness of deficits, Decreased awareness of safety Awareness: Emergent Problem Solving: Slow processing, Requires verbal cues, Difficulty sequencing, Requires tactile cues General Comments: pt oriented to self, place, but required correction of date and  re-education on recent procedures and reason for current admission. Pt slightly confused throughout mixing up the ages of her children and grandchildren. pt with decreased awareness of deficits and poor safety awareness. Pt making strongly voiced preference to return home with assist from family.        General Comments      Exercises     Assessment/Plan    PT Assessment Patient needs continued PT services  PT Problem List Decreased range of motion;Decreased strength;Decreased activity tolerance;Decreased balance;Decreased mobility;Decreased knowledge of use of DME;Decreased safety awareness;Decreased knowledge of precautions;Cardiopulmonary status limiting activity       PT Treatment Interventions DME instruction;Gait training;Stair training;Functional mobility training;Therapeutic activities;Therapeutic exercise;Balance training;Cognitive remediation;Neuromuscular re-education;Patient/family education    PT Goals (Current goals can be found in the Care Plan section)  Acute Rehab PT Goals Patient Stated Goal: "I want to go home" PT Goal Formulation: With patient Time For Goal Achievement: 12/09/22 Potential to Achieve Goals: Good    Frequency Min 3X/week     Co-evaluation               AM-PAC PT "6 Clicks" Mobility  Outcome Measure Help needed turning from your back to your side while in a flat bed without using bedrails?: A Little Help needed moving from lying on your back to sitting on the side of a flat bed without using bedrails?: A Little Help needed moving to and from a bed to a chair (including a wheelchair)?: A Little Help needed standing up from a chair using your arms (e.g., wheelchair or bedside chair)?: A Little Help needed to walk  in hospital room?: A Little Help needed climbing 3-5 steps with a railing? : A Lot 6 Click Score: 17    End of Session Equipment Utilized During Treatment: Gait belt Activity Tolerance: Patient tolerated treatment  well Patient left: with call bell/phone within reach;with family/visitor present;in chair;with chair alarm set Nurse Communication: Mobility status PT Visit Diagnosis: Unsteadiness on feet (R26.81);Muscle weakness (generalized) (M62.81);Difficulty in walking, not elsewhere classified (R26.2);Other abnormalities of gait and mobility (R26.89)    Time: 4098-1191 PT Time Calculation (min) (ACUTE ONLY): 31 min   Charges:   PT Evaluation $PT Eval Moderate Complexity: 1 Mod PT Treatments $Gait Training: 8-22 mins        Wynn Maudlin, DPT Acute Rehabilitation Services Office [phone]  11/25/22 5:09 PM

## 2022-11-26 ENCOUNTER — Inpatient Hospital Stay (HOSPITAL_COMMUNITY): Payer: Medicare HMO

## 2022-11-26 DIAGNOSIS — R001 Bradycardia, unspecified: Secondary | ICD-10-CM | POA: Diagnosis not present

## 2022-11-26 LAB — BASIC METABOLIC PANEL
Anion gap: 11 (ref 5–15)
BUN: <5 mg/dL — ABNORMAL LOW (ref 8–23)
CO2: 24 mmol/L (ref 22–32)
Calcium: 8 mg/dL — ABNORMAL LOW (ref 8.9–10.3)
Chloride: 97 mmol/L — ABNORMAL LOW (ref 98–111)
Creatinine, Ser: 0.57 mg/dL (ref 0.44–1.00)
GFR, Estimated: >60 mL/min (ref >60)
Glucose, Bld: 93 mg/dL (ref 70–99)
Potassium: 3.2 mmol/L — ABNORMAL LOW (ref 3.5–5.1)
Sodium: 132 mmol/L — ABNORMAL LOW (ref 135–145)

## 2022-11-26 LAB — CBC WITH DIFFERENTIAL/PLATELET
Abs Immature Granulocytes: 0.02 10*3/uL (ref 0.00–0.07)
Basophils Absolute: 0 10*3/uL (ref 0.0–0.1)
Basophils Relative: 0 %
Eosinophils Absolute: 0.1 10*3/uL (ref 0.0–0.5)
Eosinophils Relative: 1 %
HCT: 29.3 % — ABNORMAL LOW (ref 36.0–46.0)
Hemoglobin: 11 g/dL — ABNORMAL LOW (ref 12.0–15.0)
Immature Granulocytes: 0 %
Lymphocytes Relative: 7 %
Lymphs Abs: 0.6 10*3/uL — ABNORMAL LOW (ref 0.7–4.0)
MCH: 32.3 pg (ref 26.0–34.0)
MCHC: 37.5 g/dL — ABNORMAL HIGH (ref 30.0–36.0)
MCV: 85.9 fL (ref 80.0–100.0)
Monocytes Absolute: 0.5 10*3/uL (ref 0.1–1.0)
Monocytes Relative: 6 %
Neutro Abs: 7.1 10*3/uL (ref 1.7–7.7)
Neutrophils Relative %: 86 %
Platelets: 166 10*3/uL (ref 150–400)
RBC: 3.41 MIL/uL — ABNORMAL LOW (ref 3.87–5.11)
RDW: 12.1 % (ref 11.5–15.5)
WBC: 8.3 10*3/uL (ref 4.0–10.5)
nRBC: 0 % (ref 0.0–0.2)

## 2022-11-26 LAB — COMPREHENSIVE METABOLIC PANEL
ALT: 33 U/L (ref 0–44)
AST: 27 U/L (ref 15–41)
Albumin: 3.5 g/dL (ref 3.5–5.0)
Alkaline Phosphatase: 57 U/L (ref 38–126)
Anion gap: 10 (ref 5–15)
BUN: <5 mg/dL — ABNORMAL LOW (ref 8–23)
CO2: 26 mmol/L (ref 22–32)
Calcium: 8.2 mg/dL — ABNORMAL LOW (ref 8.9–10.3)
Chloride: 96 mmol/L — ABNORMAL LOW (ref 98–111)
Creatinine, Ser: 0.64 mg/dL (ref 0.44–1.00)
GFR, Estimated: >60 mL/min (ref >60)
Glucose, Bld: 129 mg/dL — ABNORMAL HIGH (ref 70–99)
Potassium: 3.1 mmol/L — ABNORMAL LOW (ref 3.5–5.1)
Sodium: 132 mmol/L — ABNORMAL LOW (ref 135–145)
Total Bilirubin: 1.2 mg/dL (ref 0.3–1.2)
Total Protein: 6.3 g/dL — ABNORMAL LOW (ref 6.5–8.1)

## 2022-11-26 LAB — GLUCOSE, CAPILLARY
Glucose-Capillary: 111 mg/dL — ABNORMAL HIGH (ref 70–99)
Glucose-Capillary: 148 mg/dL — ABNORMAL HIGH (ref 70–99)
Glucose-Capillary: 91 mg/dL (ref 70–99)
Glucose-Capillary: 94 mg/dL (ref 70–99)

## 2022-11-26 LAB — CULTURE, BLOOD (ROUTINE X 2): Special Requests: ADEQUATE

## 2022-11-26 LAB — PROCALCITONIN: Procalcitonin: <0.1 ng/mL

## 2022-11-26 LAB — MAGNESIUM: Magnesium: 1.8 mg/dL (ref 1.7–2.4)

## 2022-11-26 MED ORDER — FUROSEMIDE 10 MG/ML IJ SOLN
20.0000 mg | Freq: Once | INTRAMUSCULAR | Status: AC
  Administered 2022-11-26: 20 mg via INTRAVENOUS
  Filled 2022-11-26: qty 2

## 2022-11-26 MED ORDER — AMOXICILLIN-POT CLAVULANATE 875-125 MG PO TABS
1.0000 | ORAL_TABLET | Freq: Two times a day (BID) | ORAL | Status: DC
  Administered 2022-11-26 – 2022-11-27 (×3): 1 via ORAL
  Filled 2022-11-26 (×3): qty 1

## 2022-11-26 MED ORDER — POTASSIUM CHLORIDE CRYS ER 20 MEQ PO TBCR
40.0000 meq | EXTENDED_RELEASE_TABLET | ORAL | Status: AC
  Administered 2022-11-26 (×2): 40 meq via ORAL
  Filled 2022-11-26 (×2): qty 2

## 2022-11-26 MED ORDER — ENOXAPARIN SODIUM 40 MG/0.4ML IJ SOSY
40.0000 mg | PREFILLED_SYRINGE | INTRAMUSCULAR | Status: DC
  Administered 2022-11-26 – 2022-11-27 (×2): 40 mg via SUBCUTANEOUS
  Filled 2022-11-26 (×2): qty 0.4

## 2022-11-26 NOTE — Progress Notes (Signed)
[facility] Progress Note Patient: [name] FAO:[id number] DOB: 1950-08-19 DOA: 11/22/2022  DOS: the patient was seen and examined on 11/26/2022  Brief hospital course: PMH of htn, hypothyroidism, cirrhosis, portal hypertension, paf not on chronic a/c, basilar artery aneurysm s/p coil presented with encephalopathy, hypotension with bradycardia req cutaneous pacing to maintain BP.  While awaiting cardiology for temporary pacemaker placement patient become unresponsive requiring intubation in ED.  Patient was admitted to the ICU. Extubated on 6/1.  Treated with peripheral pressors.  Also received IV antibiotics.  Temporary pacemaker was removed on 6/2.  Appears to have pleural effusion on right. Assessment and Plan: Symptomatic bradycardia:  Paroxysmal atrial fibrillation at baseline (not on chronic a/c) H/o hypertension Plan: TVP removed, appreciate EP input. Off pressors Continue to hold beta-blocker. Needs to remain and observe on telemetry   Acute hypoxemic respiratory failure requiring intubation mechanical ventilation Plan: Resolved   Acute encephalopathy:  Encephalopathy has resolved, likely related to above, resolved   Aki Transaminitis:  Plan: History of baseline cirrhosis, continue supportive care Avoid nephrotoxic agents follow urine output   Hypothyroidism:  Continue home levothyroxine   Hyponatremia Plan: Supportive care  Stage I sacral medial pressure ulcer present on admission. Continue foam dressing.   Subjective: No nausea no vomiting no fever no chills.  Appears to have short of breath but denies any shortness of breath.  No chest pain.  Physical Exam: General: in increased distress, No Rash Cardiovascular: S1 and S2 Present, No Murmur Respiratory: Increased respiratory effort, Bilateral Air entry present.  Right basal crackles, No wheezes Abdomen: Bowel Sound present, No tenderness Extremities: No edema Neuro: Alert and oriented x3, no new  focal deficit  Data Reviewed: I have Reviewed nursing notes, Vitals, and Lab results. Since last encounter, pertinent lab results CBC and BMP   . I have ordered test including CBC and BMP  . I have ordered imaging chest x-ray  .  Disposition: Status is: Inpatient Remains inpatient appropriate because: Requiring diuresis enoxaparin (LOVENOX) injection 40 mg Start: 11/26/22 1000 SCDs Start: 11/22/22 2238   Family Communication: No one at bedside Level of care: Progressive   Vitals:   11/26/22 0300 11/26/22 0821 11/26/22 1130 11/26/22 1542  BP: (!) 143/83 138/86 (!) 147/84 (!) 144/98  Pulse: 100 94 94 89  Resp: 17 19 18 20   Temp: 97.6 F (36.4 C) 98.3 F (36.8 C) 98 F (36.7 C) 98.1 F (36.7 C)  TempSrc: Oral Oral Oral Oral  SpO2: 98% 98% 96%   Weight: 50.4 kg     Height:         Author: Lynden Oxford, MD 11/26/2022 3:55 PM  Please look on [email or web address] to find out who is on call.

## 2022-11-26 NOTE — Evaluation (Signed)
Occupational Therapy Evaluation Patient Details Name: [name] MRN: [id number] DOB: 05/11/1951 Today's Date: 11/26/2022   History of Present Illness 72 y.o. female adm 5/30 with encephalopathy, hypotension, and bradycardia requiring pacing to maintain BP.  5/30 intubation and TVP placement, extubated 6/1 and TVP DC on 6/2. PMHx: HTN, hypothyroidism, cirrhosis, portal hypertension, PAF not on chronic a/c, basilar artery aneurysm s/p coil, COPD.   Clinical Impression   Pt reports independence at baseline with ADLs and uses RW for mobility, pt lives with children who can assist at d/c. Pt currenlty needing set up -mod A , min guard-min A for transfers with RW. Min cues for safety with RW. Pt needing repetition of instructions during session. Pt presenting with impairments listed below, will follow acutely. Recommend HHOT at d/c.       Recommendations for follow up therapy are one component of a multi-disciplinary discharge planning process, led by the attending physician.  Recommendations may be updated based on patient status, additional functional criteria and insurance authorization.   Assistance Recommended at Discharge Intermittent Supervision/Assistance  Patient can return home with the following A little help with walking and/or transfers;A lot of help with bathing/dressing/bathroom;Assistance with cooking/housework;Direct supervision/assist for financial management;Direct supervision/assist for medications management;Assist for transportation;Help with stairs or ramp for entrance    Functional Status Assessment  Patient has had a recent decline in their functional status and demonstrates the ability to make significant improvements in function in a reasonable and predictable amount of time.  Equipment Recommendations  Tub/shower seat    Recommendations for Other Services PT consult     Precautions / Restrictions Precautions Precautions: Fall Precaution Comments: watch HR and  BP Restrictions Weight Bearing Restrictions: No      Mobility Bed Mobility               General bed mobility comments: OOB in chair upon arrival and departure    Transfers Overall transfer level: Needs assistance Equipment used: Rolling walker (2 wheels) Transfers: Sit to/from Stand Sit to Stand: Min guard                  Balance Overall balance assessment: Needs assistance Sitting-balance support: Feet supported Sitting balance-Leahy Scale: Good     Standing balance support: Reliant on assistive device for balance, During functional activity, Bilateral upper extremity supported Standing balance-Leahy Scale: Poor                             ADL either performed or assessed with clinical judgement   ADL Overall ADL's : Needs assistance/impaired Eating/Feeding: Set up;Sitting   Grooming: Brushing hair;Supervision/safety;Sitting   Upper Body Bathing: Minimal assistance;Sitting   Lower Body Bathing: Moderate assistance;Sitting/lateral leans   Upper Body Dressing : Minimal assistance;Sitting   Lower Body Dressing: Moderate assistance;Sitting/lateral leans   Toilet Transfer: Minimal assistance;Ambulation;Regular Toilet;Rolling walker (2 wheels)   Toileting- Clothing Manipulation and Hygiene: Min guard       Functional mobility during ADLs: Minimal assistance;Rolling walker (2 wheels)       Vision   Additional Comments: functional to read number on wall at ~33ft away     Perception Perception Perception Tested?: No   Praxis Praxis Praxis tested?: Not tested    Pertinent Vitals/Pain Pain Assessment Pain Assessment: No/denies pain     Hand Dominance Right   Extremity/Trunk Assessment Upper Extremity Assessment Upper Extremity Assessment: LUE deficits/detail LUE Deficits / Details: hx of L shoulder injury in "2014"  after fall per pt, limited shoulder AROM, elbow/wrist/hand WFL LUE Coordination: decreased fine motor;decreased  gross motor   Lower Extremity Assessment Lower Extremity Assessment: Defer to PT evaluation   Cervical / Trunk Assessment Cervical / Trunk Assessment: Kyphotic   Communication Communication Communication: No difficulties   Cognition Arousal/Alertness: Awake/alert Behavior During Therapy: WFL for tasks assessed/performed Overall Cognitive Status: No family/caregiver present to determine baseline cognitive functioning Area of Impairment: Attention, Following commands, Safety/judgement, Awareness, Problem solving, Memory, Orientation                 Orientation Level: Disoriented to, Time, Situation Current Attention Level: Selective Memory: Decreased short-term memory Following Commands: Follows one step commands consistently, Follows multi-step commands inconsistently Safety/Judgement: Decreased awareness of deficits, Decreased awareness of safety Awareness: Emergent Problem Solving: Slow processing, Requires verbal cues, Difficulty sequencing, Requires tactile cues       General Comments  VSS on RA    Exercises     Shoulder Instructions      Home Living Family/patient expects to be discharged to:: Private residence Living Arrangements: Children;Other relatives Available Help at Discharge: Family Type of Home: House Home Access: Stairs to enter [employer] of Steps: 4 Entrance Stairs-Rails: Right Home Layout: One level     Bathroom Shower/Tub: Chief Strategy Officer: Standard Bathroom Accessibility: Yes   Home Equipment: Agricultural consultant (2 wheels);BSC/3in1;Cane - single point          Prior Functioning/Environment Prior Level of Function : Independent/Modified Independent             Mobility Comments: RW use ADLs Comments: ind, reports taking RW into her tub/shower        OT Problem List: Decreased range of motion;Decreased strength;Decreased activity tolerance;Impaired balance (sitting and/or standing);Decreased  cognition;Decreased safety awareness;Impaired UE functional use      OT Treatment/Interventions: Self-care/ADL training;Therapeutic exercise;Energy conservation;DME and/or AE instruction;Therapeutic activities;Patient/family education;Balance training;Cognitive remediation/compensation    OT Goals(Current goals can be found in the care plan section) Acute Rehab OT Goals Patient Stated Goal: none stated OT Goal Formulation: With patient Time For Goal Achievement: 12/10/22 Potential to Achieve Goals: Good ADL Goals Pt Will Perform Lower Body Dressing: with supervision;sitting/lateral leans;sit to/from stand Pt Will Perform Tub/Shower Transfer: with supervision;ambulating;Tub transfer;Shower transfer;rolling walker Additional ADL Goal #1: pt will perfrom 3 consective standing tasks in order to improve activity tolerance for ADLs  OT Frequency: Min 1X/week    Co-evaluation              AM-PAC OT "6 Clicks" Daily Activity     Outcome Measure Help from another person eating meals?: None Help from another person taking care of personal grooming?: A Little Help from another person toileting, which includes using toliet, bedpan, or urinal?: A Little Help from another person bathing (including washing, rinsing, drying)?: A Lot Help from another person to put on and taking off regular upper body clothing?: A Little Help from another person to put on and taking off regular lower body clothing?: A Lot 6 Click Score: 17   End of Session Equipment Utilized During Treatment: Gait belt;Rolling walker (2 wheels) Nurse Communication: Mobility status  Activity Tolerance: Patient tolerated treatment well Patient left: in bed;with call bell/phone within reach;with bed alarm set  OT Visit Diagnosis: Unsteadiness on feet (R26.81);Other abnormalities of gait and mobility (R26.89);Muscle weakness (generalized) (M62.81)                Time: 1914-7829 OT Time Calculation (min): 27 min Charges:  OT  General Charges $OT Visit: 1 Visit OT Evaluation $OT Eval Moderate Complexity: 1 Mod OT Treatments $Self Care/Home Management : 8-22 mins  Carver Fila, OTD, OTR/L [employer] [facility]erred Acute Rehab [phone]   Dalphine Handing 11/26/2022, 12:31 PM

## 2022-11-26 NOTE — Care Management Important Message (Signed)
Important Message  Patient Details  Name: [name] MRN: [id number] Date of Birth: 1951-06-11   Medicare Important Message Given:  Yes     Jerimey Burridge Stefan Church 11/26/2022, 3:31 PM

## 2022-11-26 NOTE — Progress Notes (Signed)
Physical Therapy Treatment Patient Details Name: [name] MRN: [id number] DOB: [birth year] Today's Date: 11/26/2022   History of Present Illness 72 y.o. female adm 5/30 with encephalopathy, hypotension, and bradycardia requiring pacing to maintain BP.  5/30 intubation and TVP placement, extubated 6/1 and TVP DC on 6/2. PMHx: HTN, hypothyroidism, cirrhosis, portal hypertension, PAF not on chronic a/c, basilar artery aneurysm s/p coil, COPD.    PT Comments    Pt pleasant and very willing to walk with assist for direction and cues for safety. PT with incontinent bM during gait which pt reports is not baseline. Pt with increased gait and activity tolerance with VSS. Will continue to follow.   Supine 138/86 (102) Standing 119/96 (105) HR 99-102 SPO2 90-99% on RA   Recommendations for follow up therapy are one component of a multi-disciplinary discharge planning process, led by the attending physician.  Recommendations may be updated based on patient status, additional functional criteria and insurance authorization.  Follow Up Recommendations       Assistance Recommended at Discharge Intermittent Supervision/Assistance  Patient can return home with the following A little help with walking and/or transfers;A little help with bathing/dressing/bathroom;Assistance with cooking/housework;Direct supervision/assist for medications management;Help with stairs or ramp for entrance;Assist for transportation   Equipment Recommendations  None recommended by PT    Recommendations for Other Services       Precautions / Restrictions Precautions Precautions: Fall Precaution Comments: watch HR and BP     Mobility  Bed Mobility Overal bed mobility: Needs Assistance Bed Mobility: Supine to Sit     Supine to sit: Supervision     General bed mobility comments: cues for initiation and assist for lines    Transfers Overall transfer level: Needs assistance     Sit to Stand: Min guard            General transfer comment: cues for hand placement not to pull up on RW and back fully to surface before sitting. pt then performed 10 repeated STS from recliner    Ambulation/Gait Ambulation/Gait assistance: Min guard Gait Distance (Feet): 300 Feet Assistive device: Rolling walker (2 wheels) Gait Pattern/deviations: Step-through pattern, Decreased step length - right, Decreased stride length, Trunk flexed, Drifts right/left   Gait velocity interpretation: 1.31 - 2.62 ft/sec, indicative of limited community ambulator   General Gait Details: cues for posture, proximity to rW and cues for obstacles but pt still running into obstacles x 4. Pt walked to stair well with incontinent BM at stairs with assist for pericare and linen change prior to stairs with grossly 4 min static standing with UB support during this time then return walk to room   Stairs Stairs: Yes Stairs assistance: Min assist Stair Management: Forwards, One rail Right Number of Stairs: 3 General stair comments: HHA on LUE and rail on RUE with pt able to safely perform stairs with limited assist.   Wheelchair Mobility    Modified Rankin (Stroke Patients Only)       Balance                                            Cognition Arousal/Alertness: Awake/alert Behavior During Therapy: WFL for tasks assessed/performed Overall Cognitive Status: No family/caregiver present to determine baseline cognitive functioning  Safety/Judgement: Decreased awareness of deficits, Decreased awareness of safety     General Comments: pt running into obstacles x 4 despite cues for direction. pt incontinent of stool with gait and reports this is a new occurence        Exercises      General Comments        Pertinent Vitals/Pain Pain Assessment Pain Assessment: No/denies pain    Home Living                          Prior Function             PT Goals (current goals can now be found in the care plan section) Progress towards PT goals: Progressing toward goals    Frequency    Min 1X/week      PT Plan Current plan remains appropriate    Co-evaluation              AM-PAC PT "6 Clicks" Mobility   Outcome Measure  Help needed turning from your back to your side while in a flat bed without using bedrails?: None Help needed moving from lying on your back to sitting on the side of a flat bed without using bedrails?: A Little Help needed moving to and from a bed to a chair (including a wheelchair)?: A Little Help needed standing up from a chair using your arms (e.g., wheelchair or bedside chair)?: A Little Help needed to walk in hospital room?: A Little Help needed climbing 3-5 steps with a railing? : A Little 6 Click Score: 19    End of Session   Activity Tolerance: Patient tolerated treatment well Patient left: in chair;with call bell/phone within reach;with chair alarm set Nurse Communication: Mobility status PT Visit Diagnosis: Muscle weakness (generalized) (M62.81);Difficulty in walking, not elsewhere classified (R26.2);Other abnormalities of gait and mobility (R26.89)     Time: 8466-5993 PT Time Calculation (min) (ACUTE ONLY): 26 min  Charges:  $Gait Training: 8-22 mins $Therapeutic Activity: 8-22 mins                     Merryl Hacker, PT Acute Rehabilitation Services Office: [phone]    Enedina Finner Quency Tober 11/26/2022, 10:56 AM

## 2022-11-26 NOTE — Hospital Course (Signed)
PMH of htn, hypothyroidism, cirrhosis, portal hypertension, paf not on chronic a/c, basilar artery aneurysm s/p coil presented with encephalopathy, hypotension with bradycardia req cutaneous pacing to maintain BP.  While awaiting cardiology for temporary pacemaker placement patient become unresponsive requiring intubation in ED.  Patient was admitted to the ICU. Extubated on 6/1.  Treated with peripheral pressors.  Also received IV antibiotics.  Temporary pacemaker was removed on 6/2.  Appears to have pleural effusion on right.

## 2022-11-27 ENCOUNTER — Inpatient Hospital Stay (HOSPITAL_COMMUNITY): Payer: Medicare HMO

## 2022-11-27 DIAGNOSIS — R001 Bradycardia, unspecified: Secondary | ICD-10-CM | POA: Diagnosis not present

## 2022-11-27 LAB — CULTURE, BLOOD (ROUTINE X 2)
Culture: NO GROWTH
Special Requests: ADEQUATE

## 2022-11-27 LAB — GLUCOSE, CAPILLARY: Glucose-Capillary: 91 mg/dL (ref 70–99)

## 2022-11-27 MED ORDER — AMLODIPINE BESYLATE 2.5 MG PO TABS: 2.5000 mg | ORAL_TABLET | Freq: Every day | ORAL | 0 refills | Status: AC

## 2022-11-27 NOTE — Progress Notes (Addendum)
Mobility Specialist Progress Note:    11/27/22 1037  Mobility  Activity Ambulated with assistance in hallway  Level of Assistance Contact guard assist, steadying assist  Assistive Device Front wheel walker  Distance Ambulated (ft) 160 ft  Activity Response Tolerated well  Mobility Referral Yes  $Mobility charge 1 Mobility  Mobility Specialist Start Time (ACUTE ONLY) [id number]  Mobility Specialist Stop Time (ACUTE ONLY) 0944  Mobility Specialist Time Calculation (min) (ACUTE ONLY) 8 min   Received pt in chair having no complaints and agreeable to mobility. Pt was asymptomatic throughout ambulation and returned to room w/o fault. Left in chair w/ call bell in reach and chair alarm on.  Pre Mobility Hr 102 During Mobility Hr 130 Post Mobility Hr 96  Thompson Grayer Mobility Specialist  Please contact vis Secure Chat or  Rehab Office [phone]

## 2022-11-27 NOTE — TOC Initial Note (Signed)
Transition of Care (TOC) - Initial/Assessment Note   Spoke to patient at bedside and daughter via phone.   Patient from home with her daughter, daughter's boyfriend and her adult grandson.   Patient has had home health in the past, she has no preference in agency.   Patient has walker, wheel chair , and bedside commode at home, "I have everything I need".   Tresa Endo with [facility] accepted referral for HHPT/OT messaged MD for orders  Patient Details  Name: [name] MRN: [id number] Date of Birth: 09-Nov-1950  Transition of Care [facility]) CM/SW Contact:    Kingsley Plan, RN Phone Number: 11/27/2022, 11:53 AM  Clinical Narrative:                   Expected Discharge Plan: Home w Home Health Services Barriers to Discharge: Continued Medical Work up   Patient Goals and CMS Choice Patient states their goals for this hospitalization and ongoing recovery are:: to return to home CMS Medicare.gov Compare Post Acute Care list provided to:: Patient Choice offered to / list presented to : Patient [place] ownership interest in [facility]provided to:: Patient    Expected Discharge Plan and Services   Discharge Planning Services: CM Consult Post Acute Care Choice: Home Health Living arrangements for the past 2 months: Single Family Home                 DME Arranged: N/A         HH Arranged: PT, OT HH Agency: [facility] Date HH Agency Contacted: 11/27/22 Time HH Agency Contacted: 1152 Representative spoke with at [facility] Agency: Tresa Endo  Prior Living Arrangements/Services Living arrangements for the past 2 months: Single Family Home Lives with:: Adult Children Patient language and need for interpreter reviewed:: Yes Do you feel safe going back to the place where you live?: Yes      Need for Family Participation in Patient Care: Yes (Comment) Care giver support system in place?: Yes (comment) Current home services: DME Criminal Activity/Legal  Involvement Pertinent to Current Situation/Hospitalization: No - Comment as needed  Activities of Daily Living      Permission Sought/Granted   Permission granted to share information with : Yes, Verbal Permission Granted  Share Information with NAME: Janifer Adie daughter           Emotional Assessment Appearance:: Appears stated age Attitude/Demeanor/Rapport: Engaged Affect (typically observed): Accepting Orientation: : Oriented to Self, Oriented to Place, Oriented to  Time, Oriented to Situation Alcohol / Substance Use: Not Applicable Psych Involvement: No (comment)  Admission diagnosis:  Symptomatic bradycardia [R00.1] Patient Active Problem List   Diagnosis Date Noted   Symptomatic bradycardia 11/22/2022   Brain aneurysm 01/09/2022   Cerebral aneurysm 01/09/2022   Pneumonia 10/23/2020   Acute encephalopathy 10/23/2020   Hypokalemia due to excessive gastrointestinal loss of potassium 10/08/2020   Abdominal pain 10/08/2020   Back pain 10/08/2020   Hypomagnesemia 10/08/2020   Compression fracture of body of thoracic vertebra (HCC) 10/08/2020   History of cirrhosis of liver 10/08/2020   Irregular heart beat 05/04/2020   Duodenal ulcer    GI bleed 07/15/2018   Leukocytosis 07/15/2018   Hyponatremia 07/15/2018   Essential hypertension 07/15/2018   Hypothyroidism 07/15/2018   Melena    Acute gallstone pancreatitis    Gallstone pancreatitis 12/05/2015   Acute pancreatitis 12/05/2015   Choledocholithiasis    Fracture of proximal end of right humerus 07/22/2013   Tibia/fibula fracture 07/18/2013   Mood  disorder (HCC) 05/19/2013   Undiagnosed cardiac murmurs 04/13/2013   Preventive measure 04/13/2013   Bradycardia 11/21/2011   Sleep difficulties 11/21/2011   HYPERTENSION, BENIGN ESSENTIAL 03/22/2009   TOBACCO ABUSE 10/01/2007   HYPOTHYROIDISM, POST-RADIOACTIVE IODINE 06/13/2007   CIRRHOSIS, ALCOHOLIC, LIVER 11/19/2006   PCP:  Margot Ables, MD  (Inactive) Pharmacy:   [facility] [address] ([address]), [place] - [address] [place] ([place]) [place] [address] Phone: [phone] Fax: [phone]     Social Determinants of Health (SDOH) Social History: SDOH Screenings   Tobacco Use: High Risk (11/23/2022)   SDOH Interventions:     Readmission Risk Interventions     No data to display

## 2022-11-27 NOTE — Discharge Summary (Signed)
Physician Discharge Summary   Patient: [name] MRN: [id number] DOB: 1950/06/30  Admit date:     11/22/2022  Discharge date: 11/27/2022  Discharge Physician: Lynden Oxford  PCP: Margot Ables, MD (Inactive)  Recommendations at discharge: Follow-up with PCP in 1 week.   Follow-up Information     [facility] Follow up.   Specialty: [facility] Contact information: [address] [place] [address] [place] [address] [phone]         Margot Ables, MD Follow up.   Specialty: Family Medicine Contact information: [address] [place] [address] [phone]         [facility]. Schedule an appointment as soon as possible for a visit in 1 week(s).   Contact information: [address], [address] [place] [address] [phone]               Discharge Diagnoses: Principal Problem:   Symptomatic bradycardia Active Problems:   HYPERTENSION, BENIGN ESSENTIAL   Essential hypertension   Hypothyroidism   Acute encephalopathy  Hospital Course: PMH of htn, hypothyroidism, cirrhosis, portal hypertension, paf not on chronic a/c, basilar artery aneurysm s/p coil presented with encephalopathy, hypotension with bradycardia req cutaneous pacing to maintain BP.  While awaiting cardiology for temporary pacemaker placement patient become unresponsive requiring intubation in ED.  Patient was admitted to the ICU. Extubated on 6/1.  Treated with peripheral pressors.  Also received IV antibiotics.  Temporary pacemaker was removed on 6/2.  Appears to have pleural effusion on right. Assessment and Plan  Symptomatic bradycardia:  Paroxysmal atrial fibrillation at baseline (not on chronic a/c) H/o hypertension TVP removed, appreciate EP input. Off pressors Continue to hold beta-blocker.   Acute hypoxemic respiratory failure requiring intubation mechanical ventilation Resolved Also had some  right-sided pleural effusion treated with IV Lasix.   Acute encephalopathy:  Encephalopathy has resolved, likely related to above, resolved   Aki Transaminitis:  History of baseline cirrhosis, continue supportive care   Hypothyroidism:  Continue home levothyroxine   Hyponatremia Plan: Supportive care   Stage I sacral medial pressure ulcer present on admission. Continue foam dressing.  Consultants:  Primary patient with ICU. Cardiology  Procedures performed:  Temporary pacemaker placement  DISCHARGE MEDICATION: Allergies as of 11/27/2022   No Known Allergies      Medication List     STOP taking these medications    hydrALAZINE 25 MG tablet Commonly known as: APRESOLINE   metoprolol succinate 50 MG 24 hr tablet Commonly known as: TOPROL-XL   pantoprazole 40 MG tablet Commonly known as: PROTONIX       TAKE these medications    acetaminophen 500 MG tablet Commonly known as: TYLENOL Take 1,000 mg by mouth as needed for moderate pain.   albuterol (2.5 MG/3ML) 0.083% nebulizer solution Commonly known as: PROVENTIL Take 2.5 mg by nebulization every 6 (six) hours as needed for wheezing or shortness of breath.   albuterol 108 (90 Base) MCG/ACT inhaler Commonly known as: VENTOLIN HFA Inhale 2 puffs into the lungs every 4 (four) hours as needed for wheezing or shortness of breath.   amLODipine 2.5 MG tablet Commonly known as: NORVASC Take 1 tablet (2.5 mg total) by mouth daily. What changed:  medication strength how much to take   aspirin EC 81 MG tablet Take 81 mg by mouth daily. Swallow whole.   calcium carbonate 1500 (600 Ca) MG Tabs tablet Commonly known as: OSCAL Take 1,200 mg of elemental calcium by mouth daily with  breakfast.   citalopram 20 MG tablet Commonly known as: CELEXA TAKE 1 TABLET BY MOUTH EVERY DAY.   DULoxetine 30 MG capsule Commonly known as: CYMBALTA Take 30 mg by mouth daily.   folic acid 800 MCG tablet Commonly known as:  FOLVITE Take 800 mcg by mouth daily.   gabapentin 300 MG capsule Commonly known as: NEURONTIN Take 300 mg by mouth 2 (two) times daily. What changed: Another medication with the same name was removed. Continue taking this medication, and follow the directions you see here.   ibuprofen 200 MG tablet Commonly known as: ADVIL Take 400 mg by mouth as needed for moderate pain.   levothyroxine 112 MCG tablet Commonly known as: SYNTHROID Take 112 mcg by mouth daily before breakfast. What changed: Another medication with the same name was removed. Continue taking this medication, and follow the directions you see here.   lidocaine 5 % Commonly known as: LIDODERM Place 1 patch onto the skin daily as needed (pain).   lisinopril 40 MG tablet Commonly known as: ZESTRIL Take 40 mg by mouth daily.   multivitamin with minerals Tabs tablet Take 1 tablet by mouth daily.   naloxone 4 MG/0.1ML Liqd nasal spray kit Commonly known as: NARCAN Place 0.4 mg into the nose once.   oxyCODONE-acetaminophen 7.5-325 MG tablet Commonly known as: PERCOCET Take 1 tablet by mouth 3 (three) times daily.   Vitamin D (Ergocalciferol) 1.25 MG (50000 UNIT) Caps capsule Commonly known as: DRISDOL Take 50,000 Units by mouth once a week.       Disposition: Home Diet recommendation: Cardiac diet  Discharge Exam: Vitals:   11/26/22 2312 11/27/22 0433 11/27/22 0752 11/27/22 1131  BP: (!) 150/95 (!) 140/85 (!) 140/84 (!) 155/87  Pulse: 90 88 100 (!) 103  Resp: (!) 25 19 20 19   Temp: 97.8 F (36.6 C) 98.1 F (36.7 C) 97.9 F (36.6 C)   TempSrc: Oral Oral Oral   SpO2: 96% 93% 97% 96%  Weight:      Height:       General: Appear in no distress; no visible Abnormal Neck Mass Or lumps, Conjunctiva normal Cardiovascular: S1 and S2 Present, no Murmur, Respiratory: good respiratory effort, Bilateral Air entry present and CTA, no Crackles, no wheezes Abdomen: Bowel Sound present, Non tender  Extremities: no  Pedal edema Neurology: alert and oriented to time, place, and person  Filed Weights   11/24/22 0500 11/25/22 0500 11/26/22 0300  Weight: 55.4 kg 57 kg 50.4 kg   Condition at discharge: stable  The results of significant diagnostics from this hospitalization (including imaging, microbiology, ancillary and laboratory) are listed below for reference.   Imaging Studies: DG Chest 2 View  Result Date: 11/27/2022 CLINICAL DATA:  Pleural effusion EXAM: CHEST - 2 VIEW COMPARISON:  None Available. FINDINGS: Normal cardiac silhouette. Trace bilateral pleural effusions. No infiltrate or pneumothorax. Remote LEFT rib fractures. Compression deformities in the thoracic spine with augmentation fall or prosthetic on the RIGHT. IMPRESSION: Trace bilateral pleural effusions. Electronically Signed   By: Genevive Bi M.D.   On: 11/27/2022 12:36   DG CHEST PORT 1 VIEW  Result Date: 11/26/2022 CLINICAL DATA:  [id number] Pleural effusion, right [id number] EXAM: PORTABLE CHEST - 1 VIEW COMPARISON:  11/24/2022 FINDINGS: Pacing lead has been removed. Hazy opacity at the right lung base possibly layering effusion. Left lung clear. Heart size upper limits normal. Aortic Atherosclerosis (ICD10-170.0). No pneumothorax. Right shoulder arthroplasty.  Old left rib fracture deformities. IMPRESSION: Possible layering right effusion. Electronically  Signed   By: Corlis Leak M.D.   On: 11/26/2022 10:00   DG CHEST PORT 1 VIEW  Result Date: 11/24/2022 CLINICAL DATA:  Central line placement EXAM: PORTABLE CHEST 1 VIEW COMPARISON:  11/23/2022 and older studies. FINDINGS: Right internal jugular single lead pacemaker, electrode projecting in the right ventricle, stable from the previous day's exam. No visualized central line. Endotracheal tube and nasal/orogastric tube have been removed. Hazy opacity at the right lung base has increased from the previous day's exam. Mild left lung base opacity is stable. Remainder of the lungs is clear. Small  effusion suspected. No pneumothorax. IMPRESSION: 1. No visualized central line. 2. Status post removal of the endotracheal tube and nasogastric tube. 3. Increased opacity at the right lung base since the previous day's exam suspected to be a combination of atelectasis and pleural fluid. Electronically Signed   By: Amie Portland M.D.   On: 11/24/2022 13:59   ECHOCARDIOGRAM COMPLETE  Result Date: 11/23/2022    ECHOCARDIOGRAM REPORT   Patient Name:   ESRA BARKS Lagoy Date of Exam: 11/23/2022 Medical Rec #:  [id number]         Height:       60.0 in Accession #:    [id number]        Weight:       118.4 lb Date of Birth:  September 16, 1950         BSA:          1.494 m Patient Age:    71 years          BP:           96/65 mmHg Patient Gender: F                 HR:           71 bpm. Exam Location:  Inpatient Procedure: 2D Echo, Cardiac Doppler and Color Doppler Indications:    Abnormal ECG  History:        Patient has prior history of Echocardiogram examinations, most                 recent 10/10/2020. COPD, Arrythmias:Atrial Fibrillation,                 Signs/Symptoms:Murmur; Risk Factors:Hypertension. CKD.  Sonographer:    Milda Smart Referring Phys: [id number] JESSICA MARSHALL  Sonographer Comments: Image acquisition challenging due to respiratory motion. Patient supine and on a ventilator at time of exam. IMPRESSIONS  1. Intracavitary gradient. Peak velocity 1.23 m/s. Peak gradient 6 mmHg. Left ventricular ejection fraction, by estimation, is 65 to 70%. The left ventricle has normal function. The left ventricle has no regional wall motion abnormalities. There is mild  concentric left ventricular hypertrophy. Left ventricular diastolic parameters are indeterminate. Elevated left ventricular end-diastolic pressure.  2. Right ventricular systolic function is normal. The right ventricular size is normal. There is normal pulmonary artery systolic pressure.  3. Left atrial size was severely dilated.  4. Right atrial size was  severely dilated.  5. The mitral valve is degenerative. Mild mitral valve regurgitation. No evidence of mitral stenosis.  6. Tricuspid valve regurgitation is moderate to severe.  7. The aortic valve is tricuspid. There is moderate calcification of the aortic valve. There is moderate thickening of the aortic valve. Aortic valve regurgitation is not visualized. Mild aortic valve stenosis. Aortic valve mean gradient measures 11.0 mmHg. Aortic valve Vmax measures 2.19 m/s.  8. The inferior vena cava is normal in size with  greater than 50% respiratory variability, suggesting right atrial pressure of 3 mmHg. FINDINGS  Left Ventricle: Intracavitary gradient. Peak velocity 1.23 m/s. Peak gradient 6 mmHg. Left ventricular ejection fraction, by estimation, is 65 to 70%. The left ventricle has normal function. The left ventricle has no regional wall motion abnormalities. The left ventricular internal cavity size was normal in size. There is mild concentric left ventricular hypertrophy. Left ventricular diastolic parameters are indeterminate. Elevated left ventricular end-diastolic pressure. Right Ventricle: The right ventricular size is normal. No increase in right ventricular wall thickness. Right ventricular systolic function is normal. There is normal pulmonary artery systolic pressure. The tricuspid regurgitant velocity is 2.28 m/s, and  with an assumed right atrial pressure of 3 mmHg, the estimated right ventricular systolic pressure is 23.8 mmHg. Left Atrium: Left atrial size was severely dilated. Right Atrium: Right atrial size was severely dilated. Pericardium: There is no evidence of pericardial effusion. Mitral Valve: The mitral valve is degenerative in appearance. Mild mitral valve regurgitation. No evidence of mitral valve stenosis. MV peak gradient, 6.8 mmHg. The mean mitral valve gradient is 2.0 mmHg. Tricuspid Valve: The tricuspid valve is normal in structure. Tricuspid valve regurgitation is moderate to severe.  No evidence of tricuspid stenosis. Aortic Valve: The aortic valve is tricuspid. There is moderate calcification of the aortic valve. There is moderate thickening of the aortic valve. Aortic valve regurgitation is not visualized. Mild aortic stenosis is present. Aortic valve mean gradient measures 11.0 mmHg. Aortic valve peak gradient measures 19.2 mmHg. Pulmonic Valve: The pulmonic valve was normal in structure. Pulmonic valve regurgitation is not visualized. No evidence of pulmonic stenosis. Aorta: The aortic root is normal in size and structure. Venous: The inferior vena cava is normal in size with greater than 50% respiratory variability, suggesting right atrial pressure of 3 mmHg. IAS/Shunts: No atrial level shunt detected by color flow Doppler.  LEFT VENTRICLE PLAX 2D LVIDd:         3.20 cm   Diastology LVIDs:         2.50 cm   LV e' medial:    2.27 cm/s LV PW:         1.10 cm   LV E/e' medial:  46.3 LV IVS:        1.10 cm   LV e' lateral:   5.70 cm/s LVOT diam:     2.00 cm   LV E/e' lateral: 18.4 LVOT Area:     3.14 cm  RIGHT VENTRICLE RV Basal diam:  3.80 cm RV S prime:     9.37 cm/s TAPSE (M-mode): 1.2 cm LEFT ATRIUM              Index        RIGHT ATRIUM           Index LA diam:        6.50 cm  4.35 cm/m   RA Area:     19.60 cm LA Vol (A2C):   107.0 ml 71.63 ml/m  RA Volume:   63.40 ml  42.44 ml/m LA Vol (A4C):   78.8 ml  52.75 ml/m LA Biplane Vol: 96.6 ml  64.67 ml/m  AORTIC VALVE AV Vmax:      219.00 cm/s AV Vmean:     150.000 cm/s AV VTI:       0.351 m AV Peak Grad: 19.2 mmHg AV Mean Grad: 11.0 mmHg  AORTA Ao Root diam: 3.20 cm Ao Asc diam:  3.30 cm MITRAL VALVE  TRICUSPID VALVE MV Area (PHT): 3.19 cm     TR Peak grad:   20.8 mmHg MV Peak grad:  6.8 mmHg     TR Mean grad:   15.0 mmHg MV Mean grad:  2.0 mmHg     TR Vmax:        228.00 cm/s MV Vmax:       1.30 m/s     TR Vmean:       189.0 cm/s MV Vmean:      69.9 cm/s MV Decel Time: 238 msec     SHUNTS MR Peak grad: 97.8 mmHg      Systemic Diam: 2.00 cm MR Mean grad: 62.0 mmHg MR Vmax:      494.50 cm/s MR Vmean:     375.5 cm/s MV E velocity: 105.00 cm/s Chilton Si MD Electronically signed by Chilton Si MD Signature Date/Time: 11/23/2022/4:49:28 PM    Final    DG Chest Port 1 View  Result Date: 11/23/2022 CLINICAL DATA:  Temporary transvenous cardiac pacemaker EXAM: PORTABLE CHEST 1 VIEW COMPARISON:  11/22/2022 FINDINGS: Transvenous pacer in place with tip in the right ventricle. Endotracheal tube and NG tube are unchanged. Cardiomegaly. No confluent airspace opacities, effusions or edema. No acute bony abnormality. IMPRESSION: Cardiomegaly.  No active disease. Electronically Signed   By: Charlett Nose M.D.   On: 11/23/2022 03:22   US RENAL  Result Date: 11/23/2022 CLINICAL DATA:  [id number] with acute kidney injury. EXAM: RENAL / URINARY TRACT ULTRASOUND COMPLETE COMPARISON:  CT with IV contrast 10/22/2020 FINDINGS: Right Kidney: Renal measurements: 10.1 x 4.1 x 4.4 cm = volume: 94.6 mL. Echogenicity within normal limits. No mass or hydronephrosis visualized. There is a 1.1 cm nonobstructive caliceal stone again noted in the inferior pole. Left Kidney: Renal measurements: 9.5 x 4.3 x 4.4 cm = volume: 95.3 mL. Echogenicity within normal limits. No mass, stones or hydronephrosis visualized. A 2.3 cm Bosniak 1 simple cyst is again noted arising from the inferior pole of this kidney. No follow-up imaging is recommended. Bladder: Contracted and obscured by bowel gas. Other: No free fluid. IMPRESSION: 1. No acute findings. No hydronephrosis. No increased cortical echogenicity bilaterally. 2. 1.1 cm nonobstructive caliceal stone in the inferior pole of the right kidney. 3. 2.3 cm Bosniak 1 simple cyst arising from the inferior pole left kidney. 4. Bladder is contracted and obscured by bowel gas. Electronically Signed   By: Almira Bar M.D.   On: 11/23/2022 01:33   CARDIAC CATHETERIZATION  Result Date: 11/23/2022 Images from the  original result were not included. 1. Ultrasound guided Rt IJ venous access 2. Temporary transvenous pacemaker placement Elder Negus, MD Pager: [phone] Office: [phone]  DG Chest Portable 1 View  Result Date: 11/22/2022 CLINICAL DATA:  Intubated EXAM: PORTABLE CHEST 1 VIEW COMPARISON:  11/22/2022 FINDINGS: Interval intubation, tip of the endotracheal tube is about 4 cm superior to carina. Esophageal tube tip overlies the mid stomach. Cardiomegaly. Aortic atherosclerosis. No consolidation, pleural effusion or pneumothorax IMPRESSION: Endotracheal tube tip about 4 cm superior to carina. Esophageal tube tip overlies the mid stomach, side-port near GE junction. Cardiomegaly. Electronically Signed   By: Jasmine Pang M.D.   On: 11/22/2022 22:10   DG Abdomen 1 View  Result Date: 11/22/2022 CLINICAL DATA:  OG tube placement EXAM: ABDOMEN - 1 VIEW COMPARISON:  CT 10/22/2020 FINDINGS: Numerous leads and wires limit the exam. Overall nonobstructed gas pattern. Probable tip of esophageal tube in left upper quadrant but not definitive. Oval calcification  in the pelvis. IMPRESSION: 1. Possible tip of esophageal tube in the left upper quadrant but not definitive. 2. Nonobstructed gas pattern. Electronically Signed   By: Jasmine Pang M.D.   On: 11/22/2022 22:09   DG Chest Portable 1 View  Result Date: 11/22/2022 CLINICAL DATA:  Chest discomfort EXAM: PORTABLE CHEST 1 VIEW COMPARISON:  11/22/2022 FINDINGS: Cardiomegaly. Vascular congestion. Interstitial prominence may reflect early interstitial edema. No effusions. No acute bony abnormality. Old healed bilateral rib fractures. IMPRESSION: Cardiomegaly with vascular congestion and possible early interstitial edema. Electronically Signed   By: Charlett Nose M.D.   On: 11/22/2022 21:20   CT Head Wo Contrast  Result Date: 11/22/2022 CLINICAL DATA:  Mental status change, unknown cause. Slurred speech. EXAM: CT HEAD WITHOUT CONTRAST TECHNIQUE: Contiguous  axial images were obtained from the base of the skull through the vertex without intravenous contrast. RADIATION DOSE REDUCTION: This exam was performed according to the departmental dose-optimization program which includes automated exposure control, adjustment of the mA and/or kV according to patient size and/or use of iterative reconstruction technique. COMPARISON:  Head CT 10/22/2020. FINDINGS: Brain: Within limitations of streak artifact from basilar artery aneurysm coil, no acute hemorrhage, loss of gray-white differentiation, hydrocephalus or extra-axial collection. Vascular: Interval postprocedural changes of stent assisted coil embolization of the basilar artery aneurysm. Skull: No calvarial fracture or suspicious bone lesion. Skull base is unremarkable. Sinuses/Orbits: Unremarkable. Other: None. IMPRESSION: 1. No acute intracranial abnormality. 2. Interval stent assisted coil embolization of a basilar artery aneurysm. Electronically Signed   By: Orvan Falconer M.D.   On: 11/22/2022 19:49   DG Chest Portable 1 View  Result Date: 11/22/2022 CLINICAL DATA:  Chest discomfort EXAM: PORTABLE CHEST 1 VIEW COMPARISON:  X-ray 10/25/2020 FINDINGS: Underinflated x-ray rotated to the left with overlapping defibrillator pads and cardiac leads. Right shoulder arthroplasty seen. Augmentation cement along lower thoracic spine vertebral levels. No consolidation, pneumothorax or effusion. Mild interstitial prominence. Enlarged cardiopericardial silhouette with calcified aorta. No obvious edema. Possible old right-sided rib fractures. Osteopenia IMPRESSION: Underinflated x-ray with chronic changes. No consolidation. Enlarged heart. Electronically Signed   By: Karen Kays M.D.   On: 11/22/2022 18:53    Microbiology: Results for orders placed or performed during the hospital encounter of 11/22/22  Blood culture (routine x 2)     Status: None   Collection Time: 11/22/22 10:00 PM   Specimen: BLOOD  Result Value Ref  Range Status   Specimen Description BLOOD RIGHT ANTECUBITAL  Final   Special Requests   Final    BOTTLES DRAWN AEROBIC AND ANAEROBIC Blood Culture adequate volume   Culture   Final    NO GROWTH 5 DAYS Performed at [facility], [address], [place], [place] [address]    Report Status 11/27/2022 FINAL  Final  MRSA Next Gen by PCR, Nasal     Status: None   Collection Time: 11/22/22 11:50 PM   Specimen: Nasal Mucosa; Nasal Swab  Result Value Ref Range Status   MRSA by PCR Next Gen NOT DETECTED NOT DETECTED Final    Comment: (NOTE) The GeneXpert MRSA Assay (FDA approved for NASAL specimens only), is one component of a comprehensive MRSA colonization surveillance program. It is not intended to diagnose MRSA infection nor to guide or monitor treatment for MRSA infections. Test performance is not FDA approved in patients less than 31 years old. Performed at [facility], [address] [place], [place] [address]   Blood culture (routine x 2)  Status: None (Preliminary result)   Collection Time: 11/23/22  1:37 AM   Specimen: BLOOD  Result Value Ref Range Status   Specimen Description BLOOD BLOOD RIGHT ARM  Final   Special Requests   Final    BOTTLES DRAWN AEROBIC AND ANAEROBIC Blood Culture adequate volume   Culture   Final    NO GROWTH 4 DAYS Performed at [facility], [address] [place], [place] [address]    Report Status PENDING  Incomplete   Labs: CBC: Recent Labs  Lab 11/22/22 1839 11/22/22 2125 11/22/22 2229 11/23/22 0140 11/24/22 0155 11/26/22 0918  WBC 9.8  --   --  10.2 10.8* 8.3  NEUTROABS 8.3*  --   --   --  8.5* 7.1  HGB 11.8* 12.2 11.6* 12.1 11.1* 11.0*  HCT 33.2* 36.0 34.0* 32.9* 29.7* 29.3*  MCV 90.7  --   --  86.4 87.6 85.9  PLT 198  --   --  232 191 166   Basic Metabolic Panel: Recent Labs  Lab 11/22/22 1839 11/22/22 2125 11/23/22 0140 11/24/22 0155 11/24/22 2228 11/25/22 0121 11/25/22 0939 11/26/22 0032  11/26/22 0918  NA 126*   < > 125* 126*  --  130* 130* 132* 132*  K 5.0   < > 4.8 3.9 3.4* 5.0 3.7 3.2* 3.1*  CL 89*  --  95* 98  --  100 99 97* 96*  CO2 21*  --  19* 18*  --  21* 24 24 26   GLUCOSE 90  --  153* 113*  --  80 106* 93 129*  BUN 39*  --  37* 36*  --  18 11 <5* <5*  CREATININE 2.32*  --  2.26* 1.51*  --  0.77 0.67 0.57 0.64  CALCIUM 8.4*  --  7.6* 6.9*  --  7.6* 7.8* 8.0* 8.2*  MG 1.9  --  1.8  --  1.8  --   --   --  1.8  PHOS  --   --   --   --   --   --  1.4*  --   --    < > = values in this interval not displayed.   Liver Function Tests: Recent Labs  Lab 11/22/22 1839 11/25/22 0939 11/26/22 0918  AST 114*  --  27  ALT 57*  --  33  ALKPHOS 66  --  57  BILITOT 0.6  --  1.2  PROT 5.7*  --  6.3*  ALBUMIN 3.3* 3.2* 3.5   CBG: Recent Labs  Lab 11/26/22 0002 11/26/22 0342 11/26/22 0823 11/26/22 1134 11/27/22 0754  GLUCAP 94 91 148* 111* 91    Discharge time spent: greater than 30 minutes.  Signed: Lynden Oxford, MD [facility] 11/27/2022

## 2022-11-27 NOTE — Progress Notes (Signed)
Occupational Therapy Treatment Patient Details Name: [name] MRN: [id number] DOB: Nov 07, 1950 Today's Date: 11/27/2022   History of present illness 72 y.o. female adm 5/30 with encephalopathy, hypotension, and bradycardia requiring pacing to maintain BP.  5/30 intubation and TVP placement, extubated 6/1 and TVP DC on 6/2. PMHx: HTN, hypothyroidism, cirrhosis, portal hypertension, PAF not on chronic a/c, basilar artery aneurysm s/p coil, COPD.   OT comments  Pt progressing towards goals this session, completing standing grooming task and, LB bathing, UB dressing, and simulated toilet transfer with set up - minA. Pt needing min cues to locate grooming items at sink, pt with difficulty differentiating between similar looking items on countertop. HR up to 120's during session, RN notified. Pt presenting with impairments listed below, will follow acutely. Continue to recommend HHOT at d/c.    Recommendations for follow up therapy are one component of a multi-disciplinary discharge planning process, led by the attending physician.  Recommendations may be updated based on patient status, additional functional criteria and insurance authorization.    Assistance Recommended at Discharge Intermittent Supervision/Assistance  Patient can return home with the following  A little help with walking and/or transfers;A lot of help with bathing/dressing/bathroom;Assistance with cooking/housework;Direct supervision/assist for financial management;Direct supervision/assist for medications management;Assist for transportation;Help with stairs or ramp for entrance   Equipment Recommendations  Tub/shower seat    Recommendations for Other Services PT consult    Precautions / Restrictions Precautions Precautions: Fall Precaution Comments: watch HR Restrictions Weight Bearing Restrictions: No       Mobility Bed Mobility Overal bed mobility: Needs Assistance Bed Mobility: Supine to Sit     Supine to  sit: Supervision          Transfers Overall transfer level: Needs assistance Equipment used: Rolling walker (2 wheels) Transfers: Sit to/from Stand Sit to Stand: Min guard                 Balance Overall balance assessment: Needs assistance Sitting-balance support: Feet supported Sitting balance-Leahy Scale: Good     Standing balance support: Reliant on assistive device for balance, During functional activity, Bilateral upper extremity supported Standing balance-Leahy Scale: Poor                             ADL either performed or assessed with clinical judgement   ADL Overall ADL's : Needs assistance/impaired Eating/Feeding: Set up;Sitting   Grooming: Oral care;Standing;Min guard;Applying deodorant Grooming Details (indicate cue type and reason): stands at sink x5 min     Lower Body Bathing: Set up;Sitting/lateral leans Lower Body Bathing Details (indicate cue type and reason): washing lower legs seated EOB Upper Body Dressing : Minimal assistance;Sitting Upper Body Dressing Details (indicate cue type and reason): donning clean gown     Toilet Transfer: Minimal assistance;Ambulation;Regular Toilet;Rolling walker (2 wheels) Toilet Transfer Details (indicate cue type and reason): simulated         Functional mobility during ADLs: Min guard;Rolling walker (2 wheels)      Extremity/Trunk Assessment Upper Extremity Assessment Upper Extremity Assessment: LUE deficits/detail LUE Deficits / Details: hx of L shoulder injury in "2014" after fall per pt, limited shoulder AROM, elbow/wrist/hand WFL LUE Coordination: decreased fine motor;decreased gross motor   Lower Extremity Assessment Lower Extremity Assessment: Defer to PT evaluation        Vision   Additional Comments: difficulty locating similar items, picks up vial vs toothbrush at sink, functional for basic modility/ADL   Perception  Perception Perception: Not tested   Praxis  Praxis Praxis: Not tested    Cognition Arousal/Alertness: Awake/alert Behavior During Therapy: WFL for tasks assessed/performed Overall Cognitive Status: No family/caregiver present to determine baseline cognitive functioning Area of Impairment: Attention, Following commands, Safety/judgement, Awareness, Problem solving, Memory, Orientation                 Orientation Level: Disoriented to, Time, Situation Current Attention Level: Selective Memory: Decreased short-term memory Following Commands: Follows one step commands consistently, Follows multi-step commands inconsistently Safety/Judgement: Decreased awareness of deficits, Decreased awareness of safety Awareness: Emergent Problem Solving: Slow processing, Requires verbal cues, Difficulty sequencing, Requires tactile cues          Exercises      Shoulder Instructions       General Comments VSS on RA    Pertinent Vitals/ Pain       Pain Assessment Pain Assessment: No/denies pain  Home Living                                          Prior Functioning/Environment              Frequency  Min 1X/week        Progress Toward Goals  OT Goals(current goals can now be found in the care plan section)  Progress towards OT goals: Progressing toward goals  Acute Rehab OT Goals Patient Stated Goal: none stated OT Goal Formulation: With patient Time For Goal Achievement: 12/10/22 Potential to Achieve Goals: Good ADL Goals Pt Will Perform Lower Body Dressing: with supervision;sitting/lateral leans;sit to/from stand Pt Will Perform Tub/Shower Transfer: with supervision;ambulating;Tub transfer;Shower transfer;rolling walker Additional ADL Goal #1: pt will perfrom 3 consective standing tasks in order to improve activity tolerance for ADLs  Plan Discharge plan remains appropriate;Frequency remains appropriate    Co-evaluation                 AM-PAC OT "6 Clicks" Daily Activity      Outcome Measure   Help from another person eating meals?: None Help from another person taking care of personal grooming?: A Little Help from another person toileting, which includes using toliet, bedpan, or urinal?: A Little Help from another person bathing (including washing, rinsing, drying)?: A Little Help from another person to put on and taking off regular upper body clothing?: A Little Help from another person to put on and taking off regular lower body clothing?: A Lot 6 Click Score: 18    End of Session Equipment Utilized During Treatment: Gait belt;Rolling walker (2 wheels)  OT Visit Diagnosis: Unsteadiness on feet (R26.81);Other abnormalities of gait and mobility (R26.89);Muscle weakness (generalized) (M62.81)   Activity Tolerance Patient tolerated treatment well   Patient Left in bed;with call bell/phone within reach;with bed alarm set   Nurse Communication Mobility status        Time: 1610-9604 OT Time Calculation (min): 23 min  Charges: OT General Charges $OT Visit: 1 Visit OT Treatments $Self Care/Home Management : 23-37 mins  Carver Fila, OTD, OTR/L [employer] Preferred Acute Rehab [phone]   Carver Fila Koonce 11/27/2022, 8:27 AM

## 2022-11-28 ENCOUNTER — Telehealth: Payer: Self-pay

## 2022-11-28 NOTE — Telephone Encounter (Signed)
Location of hospitalization: [place] Reason for hospitalization: low heart rate and sob Date of discharge: 11/27/2022 Date of first communication with patient: today Person contacting patient: Me Current symptoms: Weak Do you understand why you were in the Hospital: Yes Questions regarding discharge instructions: None Where were you discharged to: Home Medications reviewed: Yes Allergies reviewed: Yes Dietary changes reviewed: Yes. Discussed low fat and low salt diet.  Referals reviewed: NA Activities of Daily Living: Able to with mild limitations Any transportation issues/concerns: None Any patient concerns: None Confirmed importance & date/time of Follow up appt: Yes Confirmed with patient if condition begins to worsen call. Pt was given the office number and encouraged to call back with questions or concerns: Yes

## 2022-12-10 ENCOUNTER — Encounter: Payer: Self-pay | Admitting: Cardiology

## 2022-12-10 ENCOUNTER — Ambulatory Visit: Payer: Medicare HMO | Admitting: Cardiology

## 2022-12-10 VITALS — BP 137/85 | HR 77 | Ht 60.0 in | Wt 103.2 lb

## 2022-12-10 DIAGNOSIS — Z9889 Other specified postprocedural states: Secondary | ICD-10-CM

## 2022-12-10 DIAGNOSIS — Z8719 Personal history of other diseases of the digestive system: Secondary | ICD-10-CM

## 2022-12-10 DIAGNOSIS — I480 Paroxysmal atrial fibrillation: Secondary | ICD-10-CM

## 2022-12-10 DIAGNOSIS — I725 Aneurysm of other precerebral arteries: Secondary | ICD-10-CM

## 2022-12-10 DIAGNOSIS — I10 Essential (primary) hypertension: Secondary | ICD-10-CM

## 2022-12-10 DIAGNOSIS — F17200 Nicotine dependence, unspecified, uncomplicated: Secondary | ICD-10-CM

## 2022-12-10 NOTE — Progress Notes (Signed)
Date:  12/10/2022   ID:  [name], DOB 1950/12/31, MRN [id number]  PCP:  Margot Ables, MD (Inactive)  Cardiologist:  Tessa Lerner, DO, [facility] (established care 02/09/2021) Former Cardiology Providers: Dr. Jacques Navy, Dr. Rennis Golden  Date: 12/10/22 Last Office Visit: 07/25/2021  Chief Complaint  Patient presents with   Paroxysmal A-fib [facility])   Follow-up    Posthospitalization    HPI  [name] is a 72 y.o. 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  Patient is being followed by the practice for management of atrial fibrillation.  Atrial fibrillation: During her hospitalization in 2022 she has been evaluated by [facility] providers and was deemed to be a poor anticoagulation candidate given her history of GI bleed/cirrhosis with portal hypertension, and terminal basilar artery aneurysm.  However patient was motivated with reducing her risk of thromboembolic events and the shared decision was to be evaluated by structural heart team to see if she is a candidate for left atrial appendage occlusion device.  And if needed she will probably need both GI and neurology recommendations for the use of antiplatelets/anticoagulation postprocedure given her history of GI bleed/cirrhosis with portal hypertension and brain aneurysm respectively.  Patient was lost to follow-up since January 2023 and today presents for follow-up after recent hospitalization.  She recently underwent aneurysm coiling presented to the ED with encephalopathy/hypotension/bradycardia.  Patient required transvenous pacing while the beta-blockers were being watched out.  TVP was discontinued and she was monitored and she did not require  permanent pacing.  During the hospitalization she was intubated in the ED for airway protection and later extubated on November 24, 2022.  Since discharge, patient states that she is doing well and denies anginal chest pain or heart failure symptoms.  She denies any near-syncope or syncopal effects.  Patient and daughter states that they were never evaluated for possible watchman candidacy at [facility] and for now they would like to hold off on further evaluation.  This patient is accompanied in the office by her  daughter, [name] . [name] provides verbal consent with regards to having them present during today's encounter.  FUNCTIONAL STATUS: No structured exercise program or daily routine.   ALLERGIES: No Known Allergies  MEDICATION LIST PRIOR TO VISIT: Current Meds  Medication Sig   acetaminophen (TYLENOL) 500 MG tablet Take 1,000 mg by mouth as needed for moderate pain.   albuterol (PROVENTIL) (2.5 MG/3ML) 0.083% nebulizer solution Take 2.5 mg by nebulization every 6 (six) hours as needed for wheezing or shortness of breath.   albuterol (VENTOLIN HFA) 108 (90 Base) MCG/ACT inhaler Inhale 2 puffs into the lungs every 4 (four) hours as needed for wheezing or shortness of breath.   amLODipine (NORVASC) 2.5 MG tablet Take 1 tablet (2.5 mg total) by mouth daily.   aspirin EC 81 MG tablet Take 81 mg by mouth daily. Swallow whole.   calcium carbonate (OSCAL) 1500 (600 Ca) MG TABS tablet Take 1,200 mg of elemental calcium by mouth daily with breakfast.   citalopram (CELEXA) 20 MG tablet TAKE 1 TABLET BY MOUTH EVERY DAY. (Patient taking differently: Take 20 mg by mouth daily.)   DULoxetine (CYMBALTA) 30 MG capsule Take 30 mg by mouth daily.   folic acid (FOLVITE) 800 MCG tablet Take 800 mcg by mouth daily.  gabapentin (NEURONTIN) 300 MG capsule Take 300 mg by mouth 2 (two) times daily.   ibuprofen (ADVIL) 200 MG tablet Take 400 mg by mouth as needed for moderate pain.   levothyroxine  (SYNTHROID) 112 MCG tablet Take 112 mcg by mouth daily before breakfast.   lidocaine (LIDODERM) 5 % Place 1 patch onto the skin daily as needed (pain).   lisinopril (ZESTRIL) 40 MG tablet Take 40 mg by mouth daily.   Multiple Vitamin (MULTIVITAMIN WITH MINERALS) TABS tablet Take 1 tablet by mouth daily.   naloxone (NARCAN) nasal spray 4 mg/0.1 mL Place 0.4 mg into the nose once.   oxyCODONE-acetaminophen (PERCOCET) 7.5-325 MG tablet Take 1 tablet by mouth 3 (three) times daily.   Vitamin D, Ergocalciferol, (DRISDOL) 1.25 MG (50000 UNIT) CAPS capsule Take 50,000 Units by mouth once a week.     PAST MEDICAL HISTORY: Past Medical History:  Diagnosis Date   ACROCHORDON 03/16/2010   Arthritis    Atrial fibrillation (HCC)    Chronic kidney disease    III   COPD (chronic obstructive pulmonary disease) (HCC)    GI bleeding 06/2018   Heart murmur    pt had echo 10/10/20   Hypertension    Hypothyroidism    Liver cirrhosis (HCC)    Multiple duodenal ulcers 07/16/2018   associated with melena, anemia, gastritis. due to NSAID.  hx gastric ulcers and bleeding 2004, 2007   Thyroid disease    S/p Radiation    PAST SURGICAL HISTORY: Past Surgical History:  Procedure Laterality Date   BIOPSY  07/16/2018   Procedure: BIOPSY;  Surgeon: Napoleon Form, MD;  Location: [facility] ENDOSCOPY;  Service: Endoscopy;;   BREAST EXCISIONAL BIOPSY Left    benign   CHOLECYSTECTOMY N/A 12/07/2015   Procedure: LAPAROSCOPIC CHOLECYSTECTOMY WITH INTRAOPERATIVE CHOLANGIOGRAM;  Surgeon: Ovidio Kin, MD;  Location: [facility] ORS;  Service: General;  Laterality: N/A;   COLONOSCOPY     ERCP N/A 12/06/2015   Procedure: ENDOSCOPIC RETROGRADE CHOLANGIOPANCREATOGRAPHY (ERCP);  Surgeon: Rachael Fee, MD;  Location: [facility];  Service: Endoscopy;  Laterality: N/A;   ESOPHAGOGASTRODUODENOSCOPY (EGD) WITH PROPOFOL N/A 07/16/2018   Procedure: ESOPHAGOGASTRODUODENOSCOPY (EGD) WITH PROPOFOL;  Surgeon: Napoleon Form, MD;   Location: [facility] ENDOSCOPY;  Service: Endoscopy;  Laterality: N/A;   IM NAILING TIBIA Right 07/19/2012   DR Despina Hick   IR ANGIO INTRA EXTRACRAN SEL COM CAROTID INNOMINATE BILAT MOD SED  12/12/2021   IR ANGIO VERTEBRAL SEL VERTEBRAL UNI L MOD SED  12/12/2021   IR ANGIO VERTEBRAL SEL VERTEBRAL UNI L MOD SED  01/09/2022   IR ANGIO VERTEBRAL SEL VERTEBRAL UNI R MOD SED  09/14/2022   IR ANGIOGRAM FOLLOW UP STUDY  01/09/2022   IR ANGIOGRAM FOLLOW UP STUDY  01/09/2022   IR ANGIOGRAM FOLLOW UP STUDY  01/09/2022   IR ANGIOGRAM FOLLOW UP STUDY  01/09/2022   IR ANGIOGRAM FOLLOW UP STUDY  01/09/2022   IR ANGIOGRAM FOLLOW UP STUDY  01/09/2022   IR ANGIOGRAM FOLLOW UP STUDY  01/09/2022   IR ANGIOGRAM FOLLOW UP STUDY  01/09/2022   IR ANGIOGRAM FOLLOW UP STUDY  01/09/2022   IR NEURO EACH ADD'L AFTER BASIC UNI LEFT (MS)  01/09/2022   IR TRANSCATH/EMBOLIZ  01/09/2022   IR US GUIDE VASC ACCESS RIGHT  09/14/2022   IR VERTEBROPLASTY CERV/THOR BX INC UNI/BIL INC/INJECT/IMAGING  10/17/2020   IR VERTEBROPLASTY EA ADDL (T&LS) BX INC UNI/BIL INC INJECT/IMAGING  10/17/2020   RADIOLOGY WITH ANESTHESIA N/A 01/09/2022   Procedure: Aneurysm  embolization;  Surgeon: Lisbeth Renshaw, MD;  Location: [facility] OR;  Service: Radiology;  Laterality: N/A;   SHOULDER HEMI-ARTHROPLASTY Right 07/22/2013   Procedure: RIGHT SHOULDER HEMI-ARTHROPLASTY;  Surgeon: Verlee Rossetti, MD;  Location[facility] OR;  Service: Orthopedics;  Laterality: Right;   TEMPORARY PACEMAKER N/A 11/22/2022   Procedure: TEMPORARY PACEMAKER;  Surgeon: Elder Negus, MD;  Location: [facility] INVASIVE CV LAB;  Service: Cardiovascular;  Laterality: N/A;   TIBIA IM NAIL INSERTION Right 07/19/2013   Procedure: INTRAMEDULLARY (IM) NAIL TIBIAL;  Surgeon: Loanne Drilling, MD;  Location: [facility] OR;  Service: Orthopedics;  Laterality: Right;   TUBAL LIGATION     UPPER GASTROINTESTINAL ENDOSCOPY      FAMILY HISTORY: The patient family history includes Colon polyps in her brother; Healthy in her  daughter and daughter; Heart disease in her brother, brother, and mother; Lung cancer in her father; Psoriasis in her brother.  SOCIAL HISTORY:  The patient  reports that she has been smoking cigarettes. She has a 40.00 pack-year smoking history. She has never used smokeless tobacco. She reports that she does not currently use alcohol. She reports that she does not use drugs.  REVIEW OF SYSTEMS: Review of Systems  Cardiovascular:  Negative for chest pain, dyspnea on exertion, leg swelling, palpitations and syncope.  Respiratory:  Negative for cough, shortness of breath and sputum production.     PHYSICAL EXAM:    12/10/2022   11:39 AM 11/27/2022   11:31 AM 11/27/2022    7:52 AM  Vitals with BMI  Height 5\' 0"     Weight 103 lbs 3 oz    BMI 20.15    Systolic 137 155 161  Diastolic 85 87 84  Pulse 77 103 100   Physical Exam  Constitutional: No distress.  Appears older than stated age, ambulates with a cane, hemodynamically stable.   Neck: No JVD present.  Cardiovascular: Normal rate, regular rhythm, S1 normal and S2 normal. Exam reveals no gallop, no S3 and no S4.  No murmur heard. Pulses:      Dorsalis pedis pulses are 1+ on the right side and 1+ on the left side.       Posterior tibial pulses are 1+ on the right side and 1+ on the left side.  Pulmonary/Chest: No stridor. She has no wheezes. She has no rales.  Kyphotic thoracic spine, decreased breath sounds bilaterally, poor inspiratory effort  Abdominal: Soft. Bowel sounds are normal. She exhibits no distension. There is no abdominal tenderness.  Musculoskeletal:        General: No edema.     Cervical back: Neck supple.  Neurological: She is alert and oriented to person, place, and time. She has intact cranial nerves (2-12).  Skin: Skin is warm and moist.   RADIOLOGY: IR Angio intra extracranial 12/12/2021 Approximately 9.4 mm basilar apex aneurysm   STENT-SUPPORTED COIL EMBOLIZATION OF BASILAR ANEURYSM 01/09/2022 Successful  stent supported coil embolization of a wide neck basilar artery aneurysm, as described above.  DIAGNOSTIC CEREBRAL ANGIOGRAM 09/14/2022 1. Complete occlusion of a basilar apex aneurysm treated 8 months ago with stent supported coiling. There is mild stenosis within the left P1 with good distal filling.  CARDIAC DATABASE: EKG: 02/09/2021: Atrial fibrillation, 117 bpm, old anteroseptal infarct, nonspecific T wave abnormality, premature ventricular contractions.   December 10, 2022: Sinus rhythm, 76 bpm, old anteroseptal infarct, without underlying injury pattern.  Echocardiogram: 10/10/2020: LVEF 55-60%, no regional wall motion abnormalities, moderate asymmetric hypertrophy of the basal septal segment, mild LVH,  grade 1 diastolic impairment, elevated left atrial pressure, PASP 60.8 mmHg consistent with pulmonary hypertension, severely dilated left atrium, mildly dilated right atrium, mild MR, mild to moderate aortic valve sclerosis without stenosis.:  Nov 23, 2022: LVEF 65-70%, mild LVH, indeterminate diastolic function, elevated LVEDP, peak velocity 1.23 m/s, peak gradient 6 mmHg, normal right ventricular size and function, severely dilated biatrial enlargement, mild MR, moderate to severe TR, Mild aortic stenosis (peak velocity 2.2 m/s, mean gradient 11 mmHg), estimated RAP 3 mmHg  Stress Testing: Lexiscan Tetrofosmin stress test 03/13/2021: 1 Day Rest/Stress Protocol. Stress EKG is non-diagnostic, as this is pharmacological stress test using Lexiscan. PVCS noted during rest, stress, and recovery. SPECT images notes small size, mildly reduced tracer uptake within the mid anterolateral segment, more pronounced on stress images.  This is likely due to increased gastric uptake on stress images compared to rest but ischemia cannot be excluded. Otherwise, no convincing evidence of reversible myocardial ischemia or prior infarct. Calculated LVEF 44%, visually appears preserved.  Low risk study, clinical  correlation requested  Heart Catheterization: None  LABORATORY DATA:    Latest Ref Rng & Units 11/26/2022    9:18 AM 11/24/2022    1:55 AM 11/23/2022    1:40 AM  CBC  WBC 4.0 - 10.5 K/uL 8.3  10.8  10.2   Hemoglobin 12.0 - 15.0 g/dL 16.1  09.6  04.5   Hematocrit 36.0 - 46.0 % 29.3  29.7  32.9   Platelets 150 - 400 K/uL 166  191  232        Latest Ref Rng & Units 11/26/2022    9:18 AM 11/26/2022   12:32 AM 11/25/2022    9:39 AM  CMP  Glucose 70 - 99 mg/dL 409  93  811   BUN 8 - 23 mg/dL <5  <5  11   Creatinine 0.44 - 1.00 mg/dL 9.14  7.82  9.56   Sodium 135 - 145 mmol/L 132  132  130   Potassium 3.5 - 5.1 mmol/L 3.1  3.2  3.7   Chloride 98 - 111 mmol/L 96  97  99   CO2 22 - 32 mmol/L 26  24  24    Calcium 8.9 - 10.3 mg/dL 8.2  8.0  7.8   Total Protein 6.5 - 8.1 g/dL 6.3     Total Bilirubin 0.3 - 1.2 mg/dL 1.2     Alkaline Phos 38 - 126 U/L 57     AST 15 - 41 U/L 27     ALT 0 - 44 U/L 33       Lipid Panel     Component Value Date/Time   CHOL 139 07/09/2018 1114   TRIG 75 07/09/2018 1114   HDL 58 07/09/2018 1114   CHOLHDL 2.4 07/09/2018 1114   VLDL 26 12/06/2015 0426   LDLCALC 65 07/09/2018 1114    No components found for: "NTPROBNP" No results for input(s): "PROBNP" in the last 8760 hours. Recent Labs    11/22/22 1839  TSH 0.955    BMP Recent Labs    11/25/22 0939 11/26/22 0032 11/26/22 0918  NA 130* 132* 132*  K 3.7 3.2* 3.1*  CL 99 97* 96*  CO2 24 24 26   GLUCOSE 106* 93 129*  BUN 11 <5* <5*  CREATININE 0.67 0.57 0.64  CALCIUM 7.8* 8.0* 8.2*  GFRNONAA >60 >60 >60    HEMOGLOBIN A1C Lab Results  Component Value Date   HGBA1C 4.9 11/23/2022   MPG 94 11/23/2022  IMPRESSION:    ICD-10-CM   1. Paroxysmal A-fib (HCC)  I48.0 EKG 12-Lead    2. Essential hypertension  I10     3. Basilar artery aneurysm (HCC)  I72.5     4. S/P coil embolization of cerebral aneurysm  Z98.890     5. Tobacco use disorder  F17.200     6. H/O: GI bleed  Z87.19          RECOMMENDATIONS: SAMEENA CORRIHER is a 72 y.o. 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  Paroxysmal A-fib [facility]) Per EMR diagnosed in April 2022. Rate control: None, history of Toprol-XL Rhythm control: N/A. Thromboembolic prophylaxis: N/A CHA2DS2-VASc SCORE is 3 which correlates to 3.2% risk of stroke per year. During her prior hospitalization in April 2022 she was evaluated by multiple cardiologist who had advised against oral anticoagulation given her history of alcohol use, cirrhosis with portal hypertension, history of GI bleed and imaging findings of terminal basilar artery aneurysm. Was referred to structural heart team for evaluation of candidacy for watchman implant in 2023 but patient & daughter states they did not follow through at that time.  Recently hospitalized for bradycardia requiring temporary TVP.  After metoprolol was washed out TVP was discontinued and she did not require permanent pacemaker. Will hold off on additional AV nodal blocking agents at this time as the ventricular rates are within acceptable limits. We would like to hold off on watchman evaluation.  They will call us back if they want to proceed forward I will also inform us with which healthcare system. She is not on anticoagulation both patient and daughter understand that she is at risk of thromboembolic event.  If such symptoms arise she should go to the closest ER via EMS for further evaluation and management. Echo: Severe biatrial enlargement. History of alcohol abuse-currently drinks 3 glasses of wine per day  Essential hypertension Office blood pressures are within acceptable limits. No medication titration warranted at  this time. Currently being managed by PCP-peripherally monitoring  Basilar artery aneurysm (HCC) S/P coil embolization of cerebral aneurysm Status post stent supported coil embolization in July 2023. Had a repeat cerebral angiogram in March 2024.  FINAL MEDICATION LIST END OF ENCOUNTER: No orders of the defined types were placed in this encounter.   There are no discontinued medications.    Current Outpatient Medications:    acetaminophen (TYLENOL) 500 MG tablet, Take 1,000 mg by mouth as needed for moderate pain., Disp: , Rfl:    albuterol (PROVENTIL) (2.5 MG/3ML) 0.083% nebulizer solution, Take 2.5 mg by nebulization every 6 (six) hours as needed for wheezing or shortness of breath., Disp: , Rfl:    albuterol (VENTOLIN HFA) 108 (90 Base) MCG/ACT inhaler, Inhale 2 puffs into the lungs every 4 (four) hours as needed for wheezing or shortness of breath., Disp: , Rfl:    amLODipine (NORVASC) 2.5 MG tablet, Take 1 tablet (2.5 mg total) by mouth daily., Disp: 30 tablet, Rfl: 0   aspirin EC 81 MG tablet, Take 81 mg by mouth daily. Swallow whole., Disp: , Rfl:    calcium carbonate (OSCAL) 1500 (600 Ca) MG TABS tablet, Take 1,200 mg of elemental calcium by mouth daily with breakfast., Disp: , Rfl:    citalopram (CELEXA) 20 MG tablet, TAKE 1 TABLET BY MOUTH EVERY  DAY. (Patient taking differently: Take 20 mg by mouth daily.), Disp: 90 tablet, Rfl: 0   DULoxetine (CYMBALTA) 30 MG capsule, Take 30 mg by mouth daily., Disp: , Rfl:    folic acid (FOLVITE) 800 MCG tablet, Take 800 mcg by mouth daily., Disp: , Rfl:    gabapentin (NEURONTIN) 300 MG capsule, Take 300 mg by mouth 2 (two) times daily., Disp: , Rfl:    ibuprofen (ADVIL) 200 MG tablet, Take 400 mg by mouth as needed for moderate pain., Disp: , Rfl:    levothyroxine (SYNTHROID) 112 MCG tablet, Take 112 mcg by mouth daily before breakfast., Disp: , Rfl:    lidocaine (LIDODERM) 5 %, Place 1 patch onto the skin daily as needed (pain)., Disp: ,  Rfl:    lisinopril (ZESTRIL) 40 MG tablet, Take 40 mg by mouth daily., Disp: , Rfl:    Multiple Vitamin (MULTIVITAMIN WITH MINERALS) TABS tablet, Take 1 tablet by mouth daily., Disp: , Rfl:    naloxone (NARCAN) nasal spray 4 mg/0.1 mL, Place 0.4 mg into the nose once., Disp: , Rfl:    oxyCODONE-acetaminophen (PERCOCET) 7.5-325 MG tablet, Take 1 tablet by mouth 3 (three) times daily., Disp: , Rfl:    Vitamin D, Ergocalciferol, (DRISDOL) 1.25 MG (50000 UNIT) CAPS capsule, Take 50,000 Units by mouth once a week., Disp: , Rfl:   Orders Placed This Encounter  Procedures   EKG 12-Lead    There are no Patient Instructions on file for this visit.   --Continue cardiac medications as reconciled in final medication list. --Return in about 6 months (around 06/11/2023) for Follow up, A. fib. Or sooner if needed. --Continue follow-up with your primary care physician regarding the management of your other chronic comorbid conditions.  Patient's questions and concerns were addressed to her satisfaction. She voices understanding of the instructions provided during this encounter.   This note was created using a voice recognition software as a result there may be grammatical errors inadvertently enclosed that do not reflect the nature of this encounter. Every attempt is made to correct such errors.  Tessa Lerner, [place], [facility]  Pager:  [phone] Office: [phone]

## 2023-01-31 ENCOUNTER — Ambulatory Visit
Admission: RE | Admit: 2023-01-31 | Discharge: 2023-01-31 | Disposition: A | Discharge status: Home / Self Care | Payer: Medicare HMO | Source: Ambulatory Visit | Attending: Adult Health | Admitting: Adult Health

## 2023-01-31 DIAGNOSIS — M810 Age-related osteoporosis without current pathological fracture: Secondary | ICD-10-CM

## 2023-02-01 ENCOUNTER — Inpatient Hospital Stay: Admission: RE | Admit: 2023-02-01 | Payer: Medicare HMO | Source: Ambulatory Visit

## 2023-06-11 ENCOUNTER — Ambulatory Visit: Payer: Self-pay | Admitting: Cardiology

## 2023-08-23 ENCOUNTER — Other Ambulatory Visit: Payer: Self-pay | Admitting: Neurosurgery

## 2023-08-23 DIAGNOSIS — I671 Cerebral aneurysm, nonruptured: Secondary | ICD-10-CM

## 2023-09-17 ENCOUNTER — Other Ambulatory Visit: Payer: Self-pay | Admitting: Neurosurgery

## 2023-10-14 NOTE — H&P (Incomplete)
 Chief Complaint   Aneurysm  History of Present Illness  [name] is a 73 year old woman I am seeing in follow-up.  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  She underwent routine short-term angiographic follow-up demonstrating occlusion of the basilar aneurysm and some left P1 stenosis within the stent. She remains on baby ASA.  Past Medical History   Past Medical History:  Diagnosis Date   ACROCHORDON 03/16/2010   Arthritis    Atrial fibrillation (HCC)    Chronic kidney disease    III   COPD (chronic obstructive pulmonary disease) (HCC)    GI bleeding 06/2018   Heart murmur    pt had echo 10/10/20   Hypertension    Hypothyroidism    Liver cirrhosis (HCC)    Multiple duodenal ulcers 07/16/2018   associated with melena, anemia, gastritis. due to NSAID.  hx gastric ulcers and bleeding 2004, 2007   Thyroid  disease    S/p Radiation    Past Surgical History   Past Surgical History:  Procedure Laterality Date   BIOPSY  07/16/2018   Procedure: BIOPSY;  Surgeon: Sergio Dandy, MD;  Location: [facility];  Service: Endoscopy;;   BREAST EXCISIONAL BIOPSY Left    benign   CHOLECYSTECTOMY N/A 12/07/2015   Procedure: LAPAROSCOPIC CHOLECYSTECTOMY WITH INTRAOPERATIVE CHOLANGIOGRAM;  Surgeon: Juanita Norlander, MD;  Location: [facility];  Service: General;  Laterality: N/A;   COLONOSCOPY     ERCP N/A 12/06/2015   Procedure: ENDOSCOPIC RETROGRADE CHOLANGIOPANCREATOGRAPHY (ERCP);  Surgeon: Janel Medford, MD;  Location: [facility];  Service: Endoscopy;  Laterality: N/A;   ESOPHAGOGASTRODUODENOSCOPY (EGD) WITH PROPOFOL  N/A 07/16/2018   Procedure: ESOPHAGOGASTRODUODENOSCOPY (EGD) WITH PROPOFOL ;  Surgeon: Sergio Dandy, MD;  Location: [facility];  Service: Endoscopy;  Laterality: N/A;   IM NAILING TIBIA Right 07/19/2012   DR Rossie Coon   IR ANGIO INTRA EXTRACRAN SEL COM CAROTID INNOMINATE BILAT MOD SED   12/12/2021   IR ANGIO VERTEBRAL SEL VERTEBRAL UNI L MOD SED  12/12/2021   IR ANGIO VERTEBRAL SEL VERTEBRAL UNI L MOD SED  01/09/2022   IR ANGIO VERTEBRAL SEL VERTEBRAL UNI R MOD SED  09/14/2022   IR ANGIOGRAM FOLLOW UP STUDY  01/09/2022   IR ANGIOGRAM FOLLOW UP STUDY  01/09/2022   IR ANGIOGRAM FOLLOW UP STUDY  01/09/2022   IR ANGIOGRAM FOLLOW UP STUDY  01/09/2022   IR ANGIOGRAM FOLLOW UP STUDY  01/09/2022   IR ANGIOGRAM FOLLOW UP STUDY  01/09/2022   IR ANGIOGRAM FOLLOW UP STUDY  01/09/2022   IR ANGIOGRAM FOLLOW UP STUDY  01/09/2022   IR ANGIOGRAM FOLLOW UP STUDY  01/09/2022   IR NEURO EACH ADD'L AFTER BASIC UNI LEFT (MS)  01/09/2022   IR TRANSCATH/EMBOLIZ  01/09/2022   IR US  GUIDE VASC ACCESS RIGHT  09/14/2022   IR VERTEBROPLASTY CERV/THOR BX INC UNI/BIL INC/INJECT/IMAGING  10/17/2020   IR VERTEBROPLASTY EA ADDL (T&LS) BX INC UNI/BIL INC INJECT/IMAGING  10/17/2020   RADIOLOGY WITH ANESTHESIA N/A 01/09/2022   Procedure: Aneurysm embolization;  Surgeon: Augusto Blonder, MD;  Location: [facility] OR;  Service: Radiology;  Laterality: N/A;   SHOULDER HEMI-ARTHROPLASTY Right 07/22/2013   Procedure: RIGHT SHOULDER HEMI-ARTHROPLASTY;  Surgeon: Lorriane Rote, MD;  Location: [facility] OR;  Service: Orthopedics;  Laterality: Right;   TEMPORARY PACEMAKER N/A 11/22/2022   Procedure: TEMPORARY PACEMAKER;  Surgeon: Cody Das, MD;  [facility];  Service: Cardiovascular;  Laterality: N/A;  TIBIA IM NAIL INSERTION Right 07/19/2013   Procedure: INTRAMEDULLARY (IM) NAIL TIBIAL;  Surgeon: Aurther Blue, MD;  Location: [facility];  Service: Orthopedics;  Laterality: Right;   TUBAL LIGATION     UPPER GASTROINTESTINAL ENDOSCOPY      Social History   Social History   Tobacco Use   Smoking status: Every Day    Current packs/day: 1.00    Average packs/day: 1 pack/day for 40.0 years (40.0 ttl pk-yrs)    Types: Cigarettes   Smokeless tobacco: Never  Vaping Use   Vaping status: Never Used  Substance Use  Topics   Alcohol use: Not Currently   Drug use: No    Medications   Prior to Admission medications   Medication Sig Start Date End Date Taking? Authorizing Provider  acetaminophen  (TYLENOL ) 500 MG tablet Take 1,000 mg by mouth as needed for moderate pain.    [provider]  albuterol  (PROVENTIL ) (2.5 MG/3ML) 0.083% nebulizer solution Take 2.5 mg by nebulization every 6 (six) hours as needed for wheezing or shortness of breath.    [provider]  albuterol  (VENTOLIN  HFA) 108 (90 Base) MCG/ACT inhaler Inhale 2 puffs into the lungs every 4 (four) hours as needed for wheezing or shortness of breath. 06/15/21   [provider]  amLODipine  (NORVASC ) 2.5 MG tablet Take 1 tablet (2.5 mg total) by mouth daily. 11/27/22   Kraig Peru, MD  aspirin  EC 81 MG tablet Take 81 mg by mouth daily. Swallow whole.    [provider]  calcium  carbonate (OSCAL) 1500 (600 Ca) MG TABS tablet Take 1,200 mg of elemental calcium  by mouth daily with breakfast.    [provider]  citalopram  (CELEXA ) 20 MG tablet TAKE 1 TABLET BY MOUTH EVERY DAY. Patient taking differently: Take 20 mg by mouth daily. 03/17/14   Wynetta Heckle, MD  DULoxetine (CYMBALTA) 30 MG capsule Take 30 mg by mouth daily. 11/05/22   [provider]  folic acid  (FOLVITE ) 800 MCG tablet Take 800 mcg by mouth daily.    [provider]  gabapentin  (NEURONTIN ) 300 MG capsule Take 300 mg by mouth 2 (two) times daily.    [provider]  ibuprofen (ADVIL) 200 MG tablet Take 400 mg by mouth as needed for moderate pain.    [provider]  levothyroxine  (SYNTHROID ) 112 MCG tablet Take 112 mcg by mouth daily before breakfast.    [provider]  lidocaine  (LIDODERM ) 5 % Place 1 patch onto the skin daily as needed (pain).    [provider]  lisinopril  (ZESTRIL ) 40 MG tablet Take 40 mg by mouth daily. 09/26/20   [provider]  Multiple Vitamin  (MULTIVITAMIN WITH MINERALS) TABS tablet Take 1 tablet by mouth daily.    [provider]  naloxone  (NARCAN ) nasal spray 4 mg/0.1 mL Place 0.4 mg into the nose once. 11/02/22   [provider]  oxyCODONE -acetaminophen  (PERCOCET) 7.5-325 MG tablet Take 1 tablet by mouth 3 (three) times daily. 11/09/22   [provider]  Vitamin D , Ergocalciferol , (DRISDOL) 1.25 MG (50000 UNIT) CAPS capsule Take 50,000 Units by mouth once a week. 11/02/22   [provider]    Allergies  No Known Allergies  Review of Systems  ROS  Neurologic Exam  Awake, alert, oriented Memory and concentration grossly intact Speech fluent, appropriate CN grossly intact Motor exam: Upper Extremities Deltoid Bicep Tricep Grip  Right 5/5 5/5 5/5 5/5  Left 5/5 5/5 5/5 5/5  Lower Extremities IP Quad PF DF EHL  Right 5/5 5/5 5/5 5/5 5/5  Left 5/5 5/5 5/5 5/5 5/5   Sensation grossly intact to LT   Impression  - 73 y.o. female >42yrs s/p stent-supported coil embolization of a basilar apex aneurysm, doing well  Plan  - Will proceed with diagnostic cerebral angiogram  I have reviewed the indications for the procedure as well as the details of the procedure and the expected postoperative course and recovery at length with the patient in the office. We have also reviewed in detail the risks, benefits, and alternatives to the procedure. All questions were answered and [name] provided informed consent to proceed.  Augusto Blonder, MD [facility]

## 2023-10-15 ENCOUNTER — Inpatient Hospital Stay (HOSPITAL_COMMUNITY): Admission: RE | Admit: 2023-10-15 | Payer: Medicare HMO | Source: Ambulatory Visit

## 2024-06-07 ENCOUNTER — Ambulatory Visit: Admission: EM | Admit: 2024-06-07 | Discharge: 2024-06-07 | Disposition: A | Discharge status: Home / Self Care

## 2024-06-07 VITALS — BP 146/91 | HR 97 | Temp 97.8°F | Resp 16

## 2024-06-07 DIAGNOSIS — N764 Abscess of vulva: Secondary | ICD-10-CM

## 2024-06-07 MED ORDER — SULFAMETHOXAZOLE-TRIMETHOPRIM 800-160 MG PO TABS: 1.0000 | ORAL_TABLET | Freq: Two times a day (BID) | ORAL | 0 refills | Status: AC

## 2024-06-07 NOTE — ED Triage Notes (Signed)
 Pt reports vaginal swelling related to a knot on the inside of the labia. States the area started as a bump about 4 days ago and has gotten worse. Has tried a cream, monistat, and barrier cream with no relief.

## 2024-06-07 NOTE — Discharge Instructions (Signed)
 Use warm compress as hot as you can stand on affected area 20 mins at time a couple times a day.  Be sure to complete antibiotics in their entirety. Please follow-up with PCP or OB-GYN as soon as possible.

## 2024-06-07 NOTE — ED Provider Notes (Signed)
 [facility]    CSN: [id number] Arrival date & time: 06/07/24  1311      History   Chief Complaint Chief Complaint  Patient presents with   Groin Swelling    HPI [name] is a 73 y.o. female.   Pt presents today due to 4 days of erythematous, indurated, and painful left labia.   The history is provided by the patient.    Past Medical History:  Diagnosis Date   ACROCHORDON 03/16/2010   Arthritis    Atrial fibrillation (HCC)    Chronic kidney disease    III   COPD (chronic obstructive pulmonary disease) (HCC)    GI bleeding 06/2018   Heart murmur    pt had echo 10/10/20   Hypertension    Hypothyroidism    Liver cirrhosis (HCC)    Multiple duodenal ulcers 07/16/2018   associated with melena, anemia, gastritis. due to NSAID.  hx gastric ulcers and bleeding 2004, 2007   Thyroid  disease    S/p Radiation    Patient Active Problem List   Diagnosis Date Noted   Symptomatic bradycardia 11/22/2022   Brain aneurysm 01/09/2022   Cerebral aneurysm 01/09/2022   Pneumonia 10/23/2020   Acute encephalopathy 10/23/2020   Hypokalemia due to excessive gastrointestinal loss of potassium 10/08/2020   Abdominal pain 10/08/2020   Back pain 10/08/2020   Hypomagnesemia 10/08/2020   Compression fracture of body of thoracic vertebra (HCC) 10/08/2020   History of cirrhosis of liver 10/08/2020   Irregular heart beat 05/04/2020   Duodenal ulcer    GI bleed 07/15/2018   Leukocytosis 07/15/2018   Hyponatremia 07/15/2018   Essential hypertension 07/15/2018   Hypothyroidism 07/15/2018   Melena    Acute gallstone pancreatitis    Gallstone pancreatitis 12/05/2015   Acute pancreatitis 12/05/2015   Choledocholithiasis    Fracture of proximal end of right humerus 07/22/2013   Tibia/fibula fracture 07/18/2013   Mood disorder 05/19/2013   Undiagnosed cardiac murmurs 04/13/2013   Preventive measure 04/13/2013   Bradycardia 11/21/2011   Sleep difficulties 11/21/2011    HYPERTENSION, BENIGN ESSENTIAL 03/22/2009   TOBACCO ABUSE 10/01/2007   HYPOTHYROIDISM, POST-RADIOACTIVE IODINE 06/13/2007   CIRRHOSIS, ALCOHOLIC, LIVER 11/19/2006    Past Surgical History:  Procedure Laterality Date   BIOPSY  07/16/2018   Procedure: BIOPSY;  Surgeon: Shila Gustav GAILS, MD;  Location: [facility];  Service: Endoscopy;;   BREAST EXCISIONAL BIOPSY Left    benign   CHOLECYSTECTOMY N/A 12/07/2015   Procedure: LAPAROSCOPIC CHOLECYSTECTOMY WITH INTRAOPERATIVE CHOLANGIOGRAM;  Surgeon: Alm Angle, MD;  Location: [facility];  Service: General;  Laterality: N/A;   COLONOSCOPY     ERCP N/A 12/06/2015   Procedure: ENDOSCOPIC RETROGRADE CHOLANGIOPANCREATOGRAPHY (ERCP);  Surgeon: Toribio SHAUNNA Cedar, MD;  Location: [facility];  Service: Endoscopy;  Laterality: N/A;   ESOPHAGOGASTRODUODENOSCOPY (EGD) WITH PROPOFOL  N/A 07/16/2018   Procedure: ESOPHAGOGASTRODUODENOSCOPY (EGD) WITH PROPOFOL ;  Surgeon: Shila Gustav GAILS, MD;  Location: [facility];  Service: Endoscopy;  Laterality: N/A;   IM NAILING TIBIA Right 07/19/2012   DR HIRAM   IR ANGIO INTRA EXTRACRAN SEL COM CAROTID INNOMINATE BILAT MOD SED  12/12/2021   IR ANGIO VERTEBRAL SEL VERTEBRAL UNI L MOD SED  12/12/2021   IR ANGIO VERTEBRAL SEL VERTEBRAL UNI L MOD SED  01/09/2022   IR ANGIO VERTEBRAL SEL VERTEBRAL UNI R MOD SED  09/14/2022   IR ANGIOGRAM FOLLOW UP STUDY  01/09/2022   IR ANGIOGRAM FOLLOW UP STUDY  01/09/2022   IR ANGIOGRAM FOLLOW UP  STUDY  01/09/2022   IR ANGIOGRAM FOLLOW UP STUDY  01/09/2022   IR ANGIOGRAM FOLLOW UP STUDY  01/09/2022   IR ANGIOGRAM FOLLOW UP STUDY  01/09/2022   IR ANGIOGRAM FOLLOW UP STUDY  01/09/2022   IR ANGIOGRAM FOLLOW UP STUDY  01/09/2022   IR ANGIOGRAM FOLLOW UP STUDY  01/09/2022   IR NEURO EACH ADD'L AFTER BASIC UNI LEFT (MS)  01/09/2022   IR TRANSCATH/EMBOLIZ  01/09/2022   IR US  GUIDE VASC ACCESS RIGHT  09/14/2022   IR VERTEBROPLASTY CERV/THOR BX INC UNI/BIL INC/INJECT/IMAGING  10/17/2020   IR  VERTEBROPLASTY EA ADDL (T&LS) BX INC UNI/BIL INC INJECT/IMAGING  10/17/2020   RADIOLOGY WITH ANESTHESIA N/A 01/09/2022   Procedure: Aneurysm embolization;  Surgeon: Lanis Pupa, MD;  Location: [facility];  Service: Radiology;  Laterality: N/A;   SHOULDER HEMI-ARTHROPLASTY Right 07/22/2013   Procedure: RIGHT SHOULDER HEMI-ARTHROPLASTY;  Surgeon: Elspeth JONELLE Her, MD;  Location: [facility] OR;  Service: Orthopedics;  Laterality: Right;   TEMPORARY PACEMAKER N/A 11/22/2022   Procedure: TEMPORARY PACEMAKER;  Surgeon: Elmira Newman PARAS, MD;  Location: [facility];  Service: Cardiovascular;  Laterality: N/A;   TIBIA IM NAIL INSERTION Right 07/19/2013   Procedure: INTRAMEDULLARY (IM) NAIL TIBIAL;  Surgeon: Dempsey LULLA Moan, MD;  Location: [facility];  Service: Orthopedics;  Laterality: Right;   TUBAL LIGATION     UPPER GASTROINTESTINAL ENDOSCOPY      OB History   No obstetric history on file.      Home Medications    Prior to Admission medications  Medication Sig Start Date End Date Taking? Authorizing Provider  sulfamethoxazole -trimethoprim  (BACTRIM  DS) 800-160 MG tablet Take 1 tablet by mouth 2 (two) times daily for 10 days. 06/07/24 06/17/24 Yes Andra Krabbe C, PA-C  acetaminophen  (TYLENOL ) 500 MG tablet Take 1,000 mg by mouth as needed for moderate pain.    [provider]  albuterol  (PROVENTIL ) (2.5 MG/3ML) 0.083% nebulizer solution Take 2.5 mg by nebulization every 6 (six) hours as needed for wheezing or shortness of breath.    [provider]  albuterol  (VENTOLIN  HFA) 108 (90 Base) MCG/ACT inhaler Inhale 2 puffs into the lungs every 4 (four) hours as needed for wheezing or shortness of breath. 06/15/21   [provider]  amLODipine  (NORVASC ) 2.5 MG tablet Take 1 tablet (2.5 mg total) by mouth daily. 11/27/22   Tobie Yetta HERO, MD  aspirin  EC 81 MG tablet Take 81 mg by mouth daily. Swallow whole.    [provider]  calcium  carbonate (OSCAL) 1500 (600  Ca) MG TABS tablet Take 1,200 mg of elemental calcium  by mouth daily with breakfast.    [provider]  citalopram  (CELEXA ) 20 MG tablet TAKE 1 TABLET BY MOUTH EVERY DAY. Patient taking differently: Take 20 mg by mouth daily. 03/17/14   Bryn Bernardino NOVAK, MD  DULoxetine (CYMBALTA) 30 MG capsule Take 30 mg by mouth daily. 11/05/22   [provider]  folic acid  (FOLVITE ) 800 MCG tablet Take 800 mcg by mouth daily.    [provider]  gabapentin  (NEURONTIN ) 300 MG capsule Take 300 mg by mouth 2 (two) times daily.    [provider]  ibuprofen (ADVIL) 200 MG tablet Take 400 mg by mouth as needed for moderate pain.    [provider]  levothyroxine  (SYNTHROID ) 112 MCG tablet Take 112 mcg by mouth daily before breakfast.    [provider]  lidocaine  (LIDODERM ) 5 % Place 1 patch onto the skin daily as  needed (pain).    [provider]  lisinopril  (ZESTRIL ) 40 MG tablet Take 40 mg by mouth daily. 09/26/20   [provider]  Multiple Vitamin (MULTIVITAMIN WITH MINERALS) TABS tablet Take 1 tablet by mouth daily.    [provider]  naloxone  (NARCAN ) nasal spray 4 mg/0.1 mL Place 0.4 mg into the nose once. 11/02/22   [provider]  oxyCODONE -acetaminophen  (PERCOCET) 7.5-325 MG tablet Take 1 tablet by mouth 3 (three) times daily. 11/09/22   [provider]  Vitamin D , Ergocalciferol , (DRISDOL) 1.25 MG (50000 UNIT) CAPS capsule Take 50,000 Units by mouth once a week. 11/02/22   [provider]    Family History Family History  Problem Relation Age of Onset   Heart disease Mother    Lung cancer Father    Colon polyps Brother    Heart disease Brother    Psoriasis Brother    Heart disease Brother    Healthy Daughter    Healthy Daughter    Colon cancer Neg Hx    Esophageal cancer Neg Hx    Rectal cancer Neg Hx    Stomach cancer Neg Hx     Social History Social History[1]   Allergies   Patient has  no known allergies.   Review of Systems Review of Systems   Physical Exam Triage Vital Signs ED Triage Vitals  Encounter Vitals Group     BP 06/07/24 1332 (!) 146/91     Girls Systolic BP Percentile --      Girls Diastolic BP Percentile --      Boys Systolic BP Percentile --      Boys Diastolic BP Percentile --      Pulse Rate 06/07/24 1332 97     Resp 06/07/24 1332 16     Temp 06/07/24 1332 97.8 F (36.6 C)     Temp Source 06/07/24 1332 Oral     SpO2 06/07/24 1332 94 %     Weight --      Height --      Head Circumference --      Peak Flow --      Pain Score 06/07/24 1330 9     Pain Loc --      Pain Education --      Exclude from Growth Chart --    No data found.  Updated Vital Signs BP (!) 146/91 (BP Location: Left Arm)   Pulse 97   Temp 97.8 F (36.6 C) (Oral)   Resp 16   SpO2 94%   Visual Acuity Right Eye Distance:   Left Eye Distance:   Bilateral Distance:    Right Eye Near:   Left Eye Near:    Bilateral Near:     Physical Exam Vitals and nursing note reviewed.  Constitutional:      General: She is not in acute distress.    Appearance: Normal appearance. She is not ill-appearing, toxic-appearing or diaphoretic.  Eyes:     General: No scleral icterus. Cardiovascular:     Rate and Rhythm: Normal rate and regular rhythm.     Heart sounds: Normal heart sounds.  Pulmonary:     Effort: Pulmonary effort is normal. No respiratory distress.     Breath sounds: Normal breath sounds. No wheezing or rhonchi.  Genitourinary:    Comments: Tender, swollen, erythematous left labia Skin:    General: Skin is warm.  Neurological:     Mental Status: She is alert and oriented to person, place, and  time.  Psychiatric:        Mood and Affect: Mood normal.        Behavior: Behavior normal.      UC Treatments / Results  Labs (all labs ordered are listed, but only abnormal results are displayed) Labs Reviewed - No data to display  EKG   Radiology No  results found.  Procedures Procedures (including critical care time)  Medications Ordered in UC Medications - No data to display  Initial Impression / Assessment and Plan / UC Course  I have reviewed the triage vital signs and the nursing notes.  Pertinent labs & imaging results that were available during my care of the patient were reviewed by me and considered in my medical decision making (see chart for details).    Final Clinical Impressions(s) / UC Diagnoses   Final diagnoses:  Abscess of left genital labia     Discharge Instructions      Use warm compress as hot as you can stand on affected area 20 mins at time a couple times a day.  Be sure to complete antibiotics in their entirety. Please follow-up with PCP or OB-GYN as soon as possible.    ED Prescriptions     Medication Sig Dispense Auth. Provider   sulfamethoxazole -trimethoprim  (BACTRIM  DS) 800-160 MG tablet Take 1 tablet by mouth 2 (two) times daily for 10 days. 20 tablet Andra Corean BROCKS, PA-C      PDMP not reviewed this encounter.    [1]  Social History Tobacco Use   Smoking status: Every Day    Current packs/day: 1.00    Average packs/day: 1 pack/day for 40.0 years (40.0 ttl pk-yrs)    Types: Cigarettes   Smokeless tobacco: Never  Vaping Use   Vaping status: Never Used  Substance Use Topics   Alcohol use: Not Currently   Drug use: No     Andra Corean BROCKS, PA-C 06/07/24 1449
